# Patient Record
Sex: Male | Born: 1945
Health system: Southern US, Community
[De-identification: ages and names within clinical notes are randomized; demographics above are authoritative.]

## PROBLEM LIST (undated history)

## (undated) ENCOUNTER — Emergency Department: Admission: EM | Discharge status: Absent without leave | Payer: Self-pay

## (undated) DIAGNOSIS — F329 Major depressive disorder, single episode, unspecified: Secondary | ICD-10-CM

## (undated) DIAGNOSIS — G4730 Sleep apnea, unspecified: Secondary | ICD-10-CM

## (undated) DIAGNOSIS — I679 Cerebrovascular disease, unspecified: Secondary | ICD-10-CM

## (undated) DIAGNOSIS — I10 Essential (primary) hypertension: Secondary | ICD-10-CM

## (undated) DIAGNOSIS — G629 Polyneuropathy, unspecified: Secondary | ICD-10-CM

## (undated) DIAGNOSIS — J189 Pneumonia, unspecified organism: Secondary | ICD-10-CM

## (undated) DIAGNOSIS — M7750 Other enthesopathy of unspecified foot: Secondary | ICD-10-CM

## (undated) DIAGNOSIS — I4891 Unspecified atrial fibrillation: Secondary | ICD-10-CM

## (undated) DIAGNOSIS — E785 Hyperlipidemia, unspecified: Secondary | ICD-10-CM

## (undated) DIAGNOSIS — I259 Chronic ischemic heart disease, unspecified: Secondary | ICD-10-CM

## (undated) DIAGNOSIS — N400 Enlarged prostate without lower urinary tract symptoms: Secondary | ICD-10-CM

## (undated) DIAGNOSIS — I639 Cerebral infarction, unspecified: Secondary | ICD-10-CM

## (undated) DIAGNOSIS — N179 Acute kidney failure, unspecified: Secondary | ICD-10-CM

## (undated) DIAGNOSIS — Z87442 Personal history of urinary calculi: Secondary | ICD-10-CM

## (undated) DIAGNOSIS — G4733 Obstructive sleep apnea (adult) (pediatric): Secondary | ICD-10-CM

## (undated) DIAGNOSIS — M549 Dorsalgia, unspecified: Secondary | ICD-10-CM

## (undated) DIAGNOSIS — F341 Dysthymic disorder: Secondary | ICD-10-CM

## (undated) DIAGNOSIS — I651 Occlusion and stenosis of basilar artery: Secondary | ICD-10-CM

## (undated) HISTORY — DX: Polyneuropathy, unspecified: G62.9

## (undated) HISTORY — DX: Essential (primary) hypertension: I10

## (undated) HISTORY — DX: Sleep apnea, unspecified: G47.30

## (undated) HISTORY — DX: Personal history of urinary calculi: Z87.442

## (undated) HISTORY — DX: Dorsalgia, unspecified: M54.9

## (undated) HISTORY — DX: Hyperlipidemia, unspecified: E78.5

## (undated) HISTORY — DX: Obstructive sleep apnea (adult) (pediatric): G47.33

## (undated) HISTORY — DX: Cerebrovascular disease, unspecified: I67.9

## (undated) HISTORY — DX: Occlusion and stenosis of basilar artery: I65.1

## (undated) HISTORY — DX: Major depressive disorder, single episode, unspecified: F32.9

## (undated) HISTORY — DX: Pneumonia, unspecified organism: J18.9

## (undated) HISTORY — PX: BRAIN SURGERY: SHX531

## (undated) HISTORY — DX: Chronic ischemic heart disease, unspecified: I25.9

---
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
## 1987-06-22 HISTORY — PX: ANGIOPLASTY: SHX39

## 1990-06-21 HISTORY — PX: ANGIOPLASTY: SHX39

## 1996-06-21 HISTORY — PX: ANGIOPLASTY: SHX39

## 1998-06-21 HISTORY — PX: CORONARY STENT PLACEMENT: SHX1402

## 1999-05-12 ENCOUNTER — Inpatient Hospital Stay (HOSPITAL_COMMUNITY): Admission: EM | Admit: 1999-05-12 | Discharge: 1999-05-13 | Payer: Self-pay | Admitting: *Deleted

## 1999-05-12 ENCOUNTER — Encounter: Payer: Self-pay | Admitting: Cardiology

## 2001-04-16 ENCOUNTER — Encounter: Payer: Self-pay | Admitting: Emergency Medicine

## 2001-04-16 ENCOUNTER — Inpatient Hospital Stay (HOSPITAL_COMMUNITY): Admission: EM | Admit: 2001-04-16 | Discharge: 2001-04-25 | Payer: Self-pay | Admitting: Emergency Medicine

## 2001-04-16 ENCOUNTER — Encounter: Payer: Self-pay | Admitting: Neurology

## 2001-04-16 DIAGNOSIS — I639 Cerebral infarction, unspecified: Secondary | ICD-10-CM

## 2001-04-16 HISTORY — DX: Cerebral infarction, unspecified: I63.9

## 2001-04-17 ENCOUNTER — Encounter: Payer: Self-pay | Admitting: Neurology

## 2001-04-21 ENCOUNTER — Encounter: Payer: Self-pay | Admitting: Neurology

## 2001-07-08 ENCOUNTER — Emergency Department (HOSPITAL_COMMUNITY): Admission: EM | Admit: 2001-07-08 | Discharge: 2001-07-08 | Payer: Self-pay | Admitting: Emergency Medicine

## 2001-07-09 ENCOUNTER — Encounter: Payer: Self-pay | Admitting: Emergency Medicine

## 2001-11-04 ENCOUNTER — Ambulatory Visit (HOSPITAL_COMMUNITY): Admission: RE | Admit: 2001-11-04 | Discharge: 2001-11-04 | Payer: Self-pay | Admitting: Interventional Radiology

## 2001-11-10 ENCOUNTER — Inpatient Hospital Stay (HOSPITAL_COMMUNITY): Admission: EM | Admit: 2001-11-10 | Discharge: 2001-11-16 | Payer: Self-pay

## 2001-11-11 ENCOUNTER — Encounter: Payer: Self-pay | Admitting: Cardiology

## 2001-11-14 ENCOUNTER — Encounter: Payer: Self-pay | Admitting: Cardiology

## 2004-08-19 ENCOUNTER — Ambulatory Visit (HOSPITAL_COMMUNITY): Admission: RE | Admit: 2004-08-19 | Discharge: 2004-08-19 | Payer: Self-pay | Admitting: Neurology

## 2007-09-04 DIAGNOSIS — M7750 Other enthesopathy of unspecified foot: Secondary | ICD-10-CM | POA: Insufficient documentation

## 2007-09-04 HISTORY — DX: Other enthesopathy of unspecified foot and ankle: M77.50

## 2007-09-05 DIAGNOSIS — I2510 Atherosclerotic heart disease of native coronary artery without angina pectoris: Secondary | ICD-10-CM | POA: Insufficient documentation

## 2007-09-18 DIAGNOSIS — E118 Type 2 diabetes mellitus with unspecified complications: Secondary | ICD-10-CM | POA: Insufficient documentation

## 2007-09-18 DIAGNOSIS — E119 Type 2 diabetes mellitus without complications: Secondary | ICD-10-CM | POA: Insufficient documentation

## 2007-10-16 DIAGNOSIS — R300 Dysuria: Secondary | ICD-10-CM | POA: Insufficient documentation

## 2009-10-24 ENCOUNTER — Encounter: Admission: RE | Admit: 2009-10-24 | Discharge: 2009-10-24 | Payer: Self-pay | Admitting: Cardiology

## 2009-10-31 ENCOUNTER — Encounter: Payer: Self-pay | Admitting: Thoracic Surgery (Cardiothoracic Vascular Surgery)

## 2009-10-31 ENCOUNTER — Ambulatory Visit (HOSPITAL_COMMUNITY): Admission: RE | Admit: 2009-10-31 | Discharge: 2009-10-31 | Payer: Self-pay | Admitting: Cardiology

## 2009-10-31 ENCOUNTER — Ambulatory Visit: Payer: Self-pay | Admitting: Surgery

## 2009-11-06 ENCOUNTER — Ambulatory Visit: Payer: Self-pay | Admitting: Cardiothoracic Surgery

## 2009-11-10 ENCOUNTER — Encounter: Payer: Self-pay | Admitting: Cardiothoracic Surgery

## 2009-11-12 ENCOUNTER — Inpatient Hospital Stay (HOSPITAL_COMMUNITY): Admission: RE | Admit: 2009-11-12 | Discharge: 2009-11-18 | Payer: Self-pay | Admitting: Cardiothoracic Surgery

## 2009-11-12 HISTORY — PX: CORONARY ARTERY BYPASS GRAFT: SHX141

## 2009-11-13 ENCOUNTER — Ambulatory Visit: Payer: Self-pay | Admitting: Cardiothoracic Surgery

## 2009-12-04 ENCOUNTER — Encounter: Admission: RE | Admit: 2009-12-04 | Discharge: 2009-12-04 | Payer: Self-pay | Admitting: Cardiothoracic Surgery

## 2009-12-04 ENCOUNTER — Ambulatory Visit: Payer: Self-pay | Admitting: Cardiothoracic Surgery

## 2010-04-07 ENCOUNTER — Ambulatory Visit: Payer: Self-pay | Admitting: Cardiology

## 2010-06-17 ENCOUNTER — Encounter
Admission: RE | Admit: 2010-06-17 | Discharge: 2010-06-17 | Payer: Self-pay | Source: Home / Self Care | Attending: Cardiology | Admitting: Cardiology

## 2010-06-18 ENCOUNTER — Ambulatory Visit: Payer: Self-pay | Admitting: Cardiology

## 2010-09-07 LAB — GLUCOSE, CAPILLARY
Glucose-Capillary: 113 mg/dL — ABNORMAL HIGH (ref 70–99)
Glucose-Capillary: 121 mg/dL — ABNORMAL HIGH (ref 70–99)
Glucose-Capillary: 136 mg/dL — ABNORMAL HIGH (ref 70–99)
Glucose-Capillary: 138 mg/dL — ABNORMAL HIGH (ref 70–99)
Glucose-Capillary: 148 mg/dL — ABNORMAL HIGH (ref 70–99)
Glucose-Capillary: 153 mg/dL — ABNORMAL HIGH (ref 70–99)
Glucose-Capillary: 167 mg/dL — ABNORMAL HIGH (ref 70–99)
Glucose-Capillary: 177 mg/dL — ABNORMAL HIGH (ref 70–99)
Glucose-Capillary: 180 mg/dL — ABNORMAL HIGH (ref 70–99)
Glucose-Capillary: 181 mg/dL — ABNORMAL HIGH (ref 70–99)
Glucose-Capillary: 183 mg/dL — ABNORMAL HIGH (ref 70–99)
Glucose-Capillary: 185 mg/dL — ABNORMAL HIGH (ref 70–99)
Glucose-Capillary: 192 mg/dL — ABNORMAL HIGH (ref 70–99)
Glucose-Capillary: 192 mg/dL — ABNORMAL HIGH (ref 70–99)
Glucose-Capillary: 200 mg/dL — ABNORMAL HIGH (ref 70–99)
Glucose-Capillary: 200 mg/dL — ABNORMAL HIGH (ref 70–99)
Glucose-Capillary: 203 mg/dL — ABNORMAL HIGH (ref 70–99)
Glucose-Capillary: 203 mg/dL — ABNORMAL HIGH (ref 70–99)
Glucose-Capillary: 204 mg/dL — ABNORMAL HIGH (ref 70–99)
Glucose-Capillary: 207 mg/dL — ABNORMAL HIGH (ref 70–99)
Glucose-Capillary: 224 mg/dL — ABNORMAL HIGH (ref 70–99)
Glucose-Capillary: 232 mg/dL — ABNORMAL HIGH (ref 70–99)
Glucose-Capillary: 234 mg/dL — ABNORMAL HIGH (ref 70–99)
Glucose-Capillary: 241 mg/dL — ABNORMAL HIGH (ref 70–99)
Glucose-Capillary: 257 mg/dL — ABNORMAL HIGH (ref 70–99)
Glucose-Capillary: 267 mg/dL — ABNORMAL HIGH (ref 70–99)
Glucose-Capillary: 73 mg/dL (ref 70–99)
Glucose-Capillary: 82 mg/dL (ref 70–99)
Glucose-Capillary: 93 mg/dL (ref 70–99)
Glucose-Capillary: 207 mg/dL — ABNORMAL HIGH (ref 70–99)
Glucose-Capillary: 209 mg/dL — ABNORMAL HIGH (ref 70–99)
Glucose-Capillary: 214 mg/dL — ABNORMAL HIGH (ref 70–99)
Glucose-Capillary: 256 mg/dL — ABNORMAL HIGH (ref 70–99)
Glucose-Capillary: 261 mg/dL — ABNORMAL HIGH (ref 70–99)
Glucose-Capillary: 263 mg/dL — ABNORMAL HIGH (ref 70–99)

## 2010-09-07 LAB — POCT I-STAT 3, ART BLOOD GAS (G3+)
Acid-base deficit: 1 mmol/L (ref 0.0–2.0)
Acid-base deficit: 4 mmol/L — ABNORMAL HIGH (ref 0.0–2.0)
Acid-base deficit: 4 mmol/L — ABNORMAL HIGH (ref 0.0–2.0)
Bicarbonate: 21.6 mEq/L (ref 20.0–24.0)
Bicarbonate: 22.3 mEq/L (ref 20.0–24.0)
Bicarbonate: 24.7 mEq/L — ABNORMAL HIGH (ref 20.0–24.0)
O2 Saturation: 100 %
O2 Saturation: 94 %
O2 Saturation: 98 %
Patient temperature: 35.1
Patient temperature: 36.5
TCO2: 23 mmol/L (ref 0–100)
TCO2: 24 mmol/L (ref 0–100)
TCO2: 26 mmol/L (ref 0–100)
pCO2 arterial: 40.5 mmHg (ref 35.0–45.0)
pCO2 arterial: 41.8 mmHg (ref 35.0–45.0)
pCO2 arterial: 45.8 mmHg — ABNORMAL HIGH (ref 35.0–45.0)
pH, Arterial: 7.327 — ABNORMAL LOW (ref 7.350–7.450)
pH, Arterial: 7.333 — ABNORMAL LOW (ref 7.350–7.450)
pH, Arterial: 7.34 — ABNORMAL LOW (ref 7.350–7.450)
pO2, Arterial: 109 mmHg — ABNORMAL HIGH (ref 80.0–100.0)
pO2, Arterial: 252 mmHg — ABNORMAL HIGH (ref 80.0–100.0)
pO2, Arterial: 70 mmHg — ABNORMAL LOW (ref 80.0–100.0)

## 2010-09-07 LAB — POCT I-STAT 4, (NA,K, GLUC, HGB,HCT)
Glucose, Bld: 121 mg/dL — ABNORMAL HIGH (ref 70–99)
Glucose, Bld: 134 mg/dL — ABNORMAL HIGH (ref 70–99)
Glucose, Bld: 156 mg/dL — ABNORMAL HIGH (ref 70–99)
Glucose, Bld: 186 mg/dL — ABNORMAL HIGH (ref 70–99)
Glucose, Bld: 233 mg/dL — ABNORMAL HIGH (ref 70–99)
Glucose, Bld: 99 mg/dL (ref 70–99)
HCT: 25 % — ABNORMAL LOW (ref 39.0–52.0)
HCT: 26 % — ABNORMAL LOW (ref 39.0–52.0)
HCT: 27 % — ABNORMAL LOW (ref 39.0–52.0)
HCT: 32 % — ABNORMAL LOW (ref 39.0–52.0)
HCT: 33 % — ABNORMAL LOW (ref 39.0–52.0)
HCT: 37 % — ABNORMAL LOW (ref 39.0–52.0)
Hemoglobin: 10.9 g/dL — ABNORMAL LOW (ref 13.0–17.0)
Hemoglobin: 11.2 g/dL — ABNORMAL LOW (ref 13.0–17.0)
Hemoglobin: 12.6 g/dL — ABNORMAL LOW (ref 13.0–17.0)
Hemoglobin: 8.5 g/dL — ABNORMAL LOW (ref 13.0–17.0)
Hemoglobin: 8.8 g/dL — ABNORMAL LOW (ref 13.0–17.0)
Hemoglobin: 9.2 g/dL — ABNORMAL LOW (ref 13.0–17.0)
Potassium: 3.5 mEq/L (ref 3.5–5.1)
Potassium: 4.1 mEq/L (ref 3.5–5.1)
Potassium: 4.1 mEq/L (ref 3.5–5.1)
Potassium: 4.2 mEq/L (ref 3.5–5.1)
Potassium: 4.7 mEq/L (ref 3.5–5.1)
Potassium: 5.3 mEq/L — ABNORMAL HIGH (ref 3.5–5.1)
Sodium: 131 mEq/L — ABNORMAL LOW (ref 135–145)
Sodium: 134 mEq/L — ABNORMAL LOW (ref 135–145)
Sodium: 136 mEq/L (ref 135–145)
Sodium: 136 mEq/L (ref 135–145)
Sodium: 138 mEq/L (ref 135–145)
Sodium: 138 mEq/L (ref 135–145)

## 2010-09-07 LAB — BASIC METABOLIC PANEL
BUN: 7 mg/dL (ref 6–23)
BUN: 8 mg/dL (ref 6–23)
BUN: 9 mg/dL (ref 6–23)
CO2: 24 mEq/L (ref 19–32)
CO2: 28 mEq/L (ref 19–32)
CO2: 28 mEq/L (ref 19–32)
Calcium: 7.6 mg/dL — ABNORMAL LOW (ref 8.4–10.5)
Calcium: 8 mg/dL — ABNORMAL LOW (ref 8.4–10.5)
Calcium: 8.5 mg/dL (ref 8.4–10.5)
Chloride: 101 mEq/L (ref 96–112)
Chloride: 103 mEq/L (ref 96–112)
Chloride: 109 mEq/L (ref 96–112)
Creatinine, Ser: 1.02 mg/dL (ref 0.4–1.5)
Creatinine, Ser: 1.02 mg/dL (ref 0.4–1.5)
Creatinine, Ser: 1.04 mg/dL (ref 0.4–1.5)
GFR calc Af Amer: >60 mL/min (ref >60)
GFR calc Af Amer: >60 mL/min (ref >60)
GFR calc Af Amer: >60 mL/min (ref >60)
GFR calc non Af Amer: >60 mL/min (ref >60)
GFR calc non Af Amer: >60 mL/min (ref >60)
GFR calc non Af Amer: >60 mL/min (ref >60)
Glucose, Bld: 142 mg/dL — ABNORMAL HIGH (ref 70–99)
Glucose, Bld: 170 mg/dL — ABNORMAL HIGH (ref 70–99)
Glucose, Bld: 193 mg/dL — ABNORMAL HIGH (ref 70–99)
Potassium: 3.6 mEq/L (ref 3.5–5.1)
Potassium: 3.8 mEq/L (ref 3.5–5.1)
Potassium: 3.9 mEq/L (ref 3.5–5.1)
Sodium: 134 mEq/L — ABNORMAL LOW (ref 135–145)
Sodium: 136 mEq/L (ref 135–145)
Sodium: 138 mEq/L (ref 135–145)

## 2010-09-07 LAB — POCT I-STAT, CHEM 8
BUN: 10 mg/dL (ref 6–23)
BUN: 9 mg/dL (ref 6–23)
Calcium, Ion: 1.1 mmol/L — ABNORMAL LOW (ref 1.12–1.32)
Calcium, Ion: 1.13 mmol/L (ref 1.12–1.32)
Chloride: 103 mEq/L (ref 96–112)
Chloride: 106 mEq/L (ref 96–112)
Creatinine, Ser: 1 mg/dL (ref 0.4–1.5)
Creatinine, Ser: 1.1 mg/dL (ref 0.4–1.5)
Glucose, Bld: 200 mg/dL — ABNORMAL HIGH (ref 70–99)
Glucose, Bld: 205 mg/dL — ABNORMAL HIGH (ref 70–99)
HCT: 35 % — ABNORMAL LOW (ref 39.0–52.0)
HCT: 39 % (ref 39.0–52.0)
Hemoglobin: 11.9 g/dL — ABNORMAL LOW (ref 13.0–17.0)
Hemoglobin: 13.3 g/dL (ref 13.0–17.0)
Potassium: 3.7 mEq/L (ref 3.5–5.1)
Potassium: 4.1 mEq/L (ref 3.5–5.1)
Sodium: 136 mEq/L (ref 135–145)
Sodium: 140 mEq/L (ref 135–145)
TCO2: 25 mmol/L (ref 0–100)
TCO2: 25 mmol/L (ref 0–100)

## 2010-09-07 LAB — CBC
HCT: 35.3 % — ABNORMAL LOW (ref 39.0–52.0)
HCT: 36 % — ABNORMAL LOW (ref 39.0–52.0)
HCT: 36.2 % — ABNORMAL LOW (ref 39.0–52.0)
HCT: 36.2 % — ABNORMAL LOW (ref 39.0–52.0)
HCT: 36.9 % — ABNORMAL LOW (ref 39.0–52.0)
HCT: 37.5 % — ABNORMAL LOW (ref 39.0–52.0)
HCT: 42 % (ref 39.0–52.0)
Hemoglobin: 12.3 g/dL — ABNORMAL LOW (ref 13.0–17.0)
Hemoglobin: 12.3 g/dL — ABNORMAL LOW (ref 13.0–17.0)
Hemoglobin: 12.6 g/dL — ABNORMAL LOW (ref 13.0–17.0)
Hemoglobin: 12.6 g/dL — ABNORMAL LOW (ref 13.0–17.0)
Hemoglobin: 12.7 g/dL — ABNORMAL LOW (ref 13.0–17.0)
Hemoglobin: 13 g/dL (ref 13.0–17.0)
Hemoglobin: 14.7 g/dL (ref 13.0–17.0)
MCHC: 34.1 g/dL (ref 30.0–36.0)
MCHC: 34.3 g/dL (ref 30.0–36.0)
MCHC: 34.6 g/dL (ref 30.0–36.0)
MCHC: 34.8 g/dL (ref 30.0–36.0)
MCHC: 35 g/dL (ref 30.0–36.0)
MCHC: 35.1 g/dL (ref 30.0–36.0)
MCHC: 35 g/dL (ref 30.0–36.0)
MCV: 91.4 fL (ref 78.0–100.0)
MCV: 91.4 fL (ref 78.0–100.0)
MCV: 91.7 fL (ref 78.0–100.0)
MCV: 91.7 fL (ref 78.0–100.0)
MCV: 91.9 fL (ref 78.0–100.0)
MCV: 92.6 fL (ref 78.0–100.0)
MCV: 90.5 fL (ref 78.0–100.0)
Platelets: 175 10*3/uL (ref 150–400)
Platelets: 188 10*3/uL (ref 150–400)
Platelets: 190 10*3/uL (ref 150–400)
Platelets: 193 10*3/uL (ref 150–400)
Platelets: 197 10*3/uL (ref 150–400)
Platelets: 212 10*3/uL (ref 150–400)
Platelets: 263 10*3/uL (ref 150–400)
RBC: 3.86 MIL/uL — ABNORMAL LOW (ref 4.22–5.81)
RBC: 3.94 MIL/uL — ABNORMAL LOW (ref 4.22–5.81)
RBC: 3.94 MIL/uL — ABNORMAL LOW (ref 4.22–5.81)
RBC: 3.95 MIL/uL — ABNORMAL LOW (ref 4.22–5.81)
RBC: 4.01 MIL/uL — ABNORMAL LOW (ref 4.22–5.81)
RBC: 4.05 MIL/uL — ABNORMAL LOW (ref 4.22–5.81)
RBC: 4.64 MIL/uL (ref 4.22–5.81)
RDW: 13.6 % (ref 11.5–15.5)
RDW: 13.8 % (ref 11.5–15.5)
RDW: 13.8 % (ref 11.5–15.5)
RDW: 13.8 % (ref 11.5–15.5)
RDW: 14 % (ref 11.5–15.5)
RDW: 14 % (ref 11.5–15.5)
RDW: 13.8 % (ref 11.5–15.5)
WBC: 10.2 10*3/uL (ref 4.0–10.5)
WBC: 11.3 10*3/uL — ABNORMAL HIGH (ref 4.0–10.5)
WBC: 12.5 10*3/uL — ABNORMAL HIGH (ref 4.0–10.5)
WBC: 13.7 10*3/uL — ABNORMAL HIGH (ref 4.0–10.5)
WBC: 14.2 10*3/uL — ABNORMAL HIGH (ref 4.0–10.5)
WBC: 8.6 10*3/uL (ref 4.0–10.5)
WBC: 7.5 10*3/uL (ref 4.0–10.5)

## 2010-09-07 LAB — BLOOD GAS, ARTERIAL
Acid-base deficit: 0.3 mmol/L (ref 0.0–2.0)
Bicarbonate: 23.6 mEq/L (ref 20.0–24.0)
Drawn by: 206361
FIO2: 0.21 %
O2 Saturation: 94.3 %
Patient temperature: 98.6
TCO2: 24.8 mmol/L (ref 0–100)
pCO2 arterial: 37.5 mmHg (ref 35.0–45.0)
pH, Arterial: 7.416 (ref 7.350–7.450)
pO2, Arterial: 71.5 mmHg — ABNORMAL LOW (ref 80.0–100.0)

## 2010-09-07 LAB — COMPREHENSIVE METABOLIC PANEL
ALT: 25 U/L (ref 0–53)
AST: 36 U/L (ref 0–37)
Albumin: 4.1 g/dL (ref 3.5–5.2)
Alkaline Phosphatase: 70 U/L (ref 39–117)
BUN: 21 mg/dL (ref 6–23)
CO2: 22 mEq/L (ref 19–32)
Calcium: 9.6 mg/dL (ref 8.4–10.5)
Chloride: 103 mEq/L (ref 96–112)
Creatinine, Ser: 1.16 mg/dL (ref 0.4–1.5)
GFR calc Af Amer: >60 mL/min (ref >60)
GFR calc non Af Amer: >60 mL/min (ref >60)
Glucose, Bld: 227 mg/dL — ABNORMAL HIGH (ref 70–99)
Potassium: 4.4 mEq/L (ref 3.5–5.1)
Sodium: 134 mEq/L — ABNORMAL LOW (ref 135–145)
Total Bilirubin: 0.5 mg/dL (ref 0.3–1.2)
Total Protein: 6.3 g/dL (ref 6.0–8.3)

## 2010-09-07 LAB — SURGICAL PCR SCREEN
MRSA, PCR: NEGATIVE
Staphylococcus aureus: NEGATIVE

## 2010-09-07 LAB — URINALYSIS, ROUTINE W REFLEX MICROSCOPIC
Bilirubin Urine: NEGATIVE
Glucose, UA: >1000 mg/dL — AB
Hgb urine dipstick: NEGATIVE
Ketones, ur: NEGATIVE mg/dL
Leukocytes, UA: NEGATIVE
Nitrite: NEGATIVE
Protein, ur: NEGATIVE mg/dL
Specific Gravity, Urine: 1.023 (ref 1.005–1.030)
Urobilinogen, UA: 0.2 mg/dL (ref 0.0–1.0)
pH: 5.5 (ref 5.0–8.0)

## 2010-09-07 LAB — URINE MICROSCOPIC-ADD ON

## 2010-09-07 LAB — PROTIME-INR
INR: 1.4 (ref 0.00–1.49)
INR: 0.97 (ref 0.00–1.49)
Prothrombin Time: 17 seconds — ABNORMAL HIGH (ref 11.6–15.2)
Prothrombin Time: 12.8 seconds (ref 11.6–15.2)

## 2010-09-07 LAB — HEMOGLOBIN A1C
Hgb A1c MFr Bld: 10.4 % — ABNORMAL HIGH (ref <5.7)
Mean Plasma Glucose: 252 mg/dL — ABNORMAL HIGH (ref <117)

## 2010-09-07 LAB — POCT I-STAT GLUCOSE
Glucose, Bld: 147 mg/dL — ABNORMAL HIGH (ref 70–99)
Glucose, Bld: 188 mg/dL — ABNORMAL HIGH (ref 70–99)
Operator id: 151361
Operator id: 3406

## 2010-09-07 LAB — TYPE AND SCREEN
ABO/RH(D): O POS
Antibody Screen: NEGATIVE

## 2010-09-07 LAB — PLATELET COUNT: Platelets: 171 10*3/uL (ref 150–400)

## 2010-09-07 LAB — CREATININE, SERUM
Creatinine, Ser: 1.16 mg/dL (ref 0.4–1.5)
Creatinine, Ser: 1.18 mg/dL (ref 0.4–1.5)
GFR calc Af Amer: >60 mL/min (ref >60)
GFR calc Af Amer: >60 mL/min (ref >60)
GFR calc non Af Amer: >60 mL/min (ref >60)
GFR calc non Af Amer: >60 mL/min (ref >60)

## 2010-09-07 LAB — MAGNESIUM
Magnesium: 2.1 mg/dL (ref 1.5–2.5)
Magnesium: 2.2 mg/dL (ref 1.5–2.5)
Magnesium: 2.6 mg/dL — ABNORMAL HIGH (ref 1.5–2.5)

## 2010-09-07 LAB — ABO/RH: ABO/RH(D): O POS

## 2010-09-07 LAB — APTT
aPTT: 36 seconds (ref 24–37)
aPTT: 29 seconds (ref 24–37)

## 2010-09-07 LAB — HEMOGLOBIN AND HEMATOCRIT, BLOOD
HCT: 26.2 % — ABNORMAL LOW (ref 39.0–52.0)
Hemoglobin: 9.2 g/dL — ABNORMAL LOW (ref 13.0–17.0)

## 2010-09-07 LAB — MRSA PCR SCREENING: MRSA by PCR: NEGATIVE

## 2010-09-08 LAB — PROTIME-INR
INR: 1 (ref 0.00–1.49)
Prothrombin Time: 13.1 seconds (ref 11.6–15.2)

## 2010-09-08 LAB — GLUCOSE, CAPILLARY
Glucose-Capillary: 218 mg/dL — ABNORMAL HIGH (ref 70–99)
Glucose-Capillary: 270 mg/dL — ABNORMAL HIGH (ref 70–99)

## 2010-10-05 ENCOUNTER — Other Ambulatory Visit: Payer: Self-pay | Admitting: *Deleted

## 2010-10-05 ENCOUNTER — Encounter: Payer: Self-pay | Admitting: *Deleted

## 2010-10-05 DIAGNOSIS — E7800 Pure hypercholesterolemia, unspecified: Secondary | ICD-10-CM

## 2010-10-06 ENCOUNTER — Encounter: Payer: Self-pay | Admitting: Cardiology

## 2010-10-06 ENCOUNTER — Other Ambulatory Visit (INDEPENDENT_AMBULATORY_CARE_PROVIDER_SITE_OTHER): Payer: Medicare Other | Admitting: *Deleted

## 2010-10-06 ENCOUNTER — Ambulatory Visit (INDEPENDENT_AMBULATORY_CARE_PROVIDER_SITE_OTHER): Payer: Medicare Other | Admitting: Cardiology

## 2010-10-06 DIAGNOSIS — I739 Peripheral vascular disease, unspecified: Secondary | ICD-10-CM | POA: Insufficient documentation

## 2010-10-06 DIAGNOSIS — I10 Essential (primary) hypertension: Secondary | ICD-10-CM

## 2010-10-06 DIAGNOSIS — E119 Type 2 diabetes mellitus without complications: Secondary | ICD-10-CM | POA: Insufficient documentation

## 2010-10-06 DIAGNOSIS — I679 Cerebrovascular disease, unspecified: Secondary | ICD-10-CM

## 2010-10-06 DIAGNOSIS — M545 Low back pain, unspecified: Secondary | ICD-10-CM

## 2010-10-06 DIAGNOSIS — I2510 Atherosclerotic heart disease of native coronary artery without angina pectoris: Secondary | ICD-10-CM

## 2010-10-06 DIAGNOSIS — M4800 Spinal stenosis, site unspecified: Secondary | ICD-10-CM

## 2010-10-06 DIAGNOSIS — E7800 Pure hypercholesterolemia, unspecified: Secondary | ICD-10-CM

## 2010-10-06 DIAGNOSIS — Z951 Presence of aortocoronary bypass graft: Secondary | ICD-10-CM | POA: Insufficient documentation

## 2010-10-06 DIAGNOSIS — I779 Disorder of arteries and arterioles, unspecified: Secondary | ICD-10-CM | POA: Insufficient documentation

## 2010-10-06 DIAGNOSIS — I25810 Atherosclerosis of coronary artery bypass graft(s) without angina pectoris: Secondary | ICD-10-CM | POA: Insufficient documentation

## 2010-10-06 HISTORY — DX: Disorder of arteries and arterioles, unspecified: I77.9

## 2010-10-06 MED ORDER — NITROGLYCERIN 0.4 MG SL SUBL: 0.4000 mg | SUBLINGUAL_TABLET | SUBLINGUAL | Status: DC | PRN

## 2010-10-06 MED ORDER — METOPROLOL TARTRATE 50 MG PO TABS: 50.0000 mg | ORAL_TABLET | Freq: Two times a day (BID) | ORAL | Status: DC

## 2010-10-06 NOTE — Assessment & Plan Note (Addendum)
I don't know if he is having claudication or neurogenic claudication. He has adequate pedal pulses. I like to get lower extremity arterial Dopplers and an MRI of his low back. He does have recurrent low back pain and I thought he has had spinal stenosis for many years but he's not been able to afford the test. He is now on Medicare and should be able to afford an MRI for evaluation.

## 2010-10-06 NOTE — Assessment & Plan Note (Signed)
He still has some soreness in his chest from his bypass surgery but is not having any recurrent angina

## 2010-10-06 NOTE — Progress Notes (Signed)
Subjective:   Marvin Salinas is seen today for a followup visit. He is seen in followup of his coronary artery disease and his coronary artery bypass grafting x3 on may 25th, 2011 by Dr. Tyrone Sage. 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 In general, he's been stable from that issue. He had ischemic heart disease with previous angioplasty of the right coronary artery in 1989. He is angioplasty left anterior descending and obtuse marginal in 1992. He angioplasty to first diagonal in October 1998 had a stent in his LAD circumflex and 8/to the diagonal in November 2000.  Other problems include diabetes, hypertension, hyperlipemia, obesity, sleep apnea, and renal calculi. He does have significant peripheral neuropathy and poorly controlled diabetes with recent hemoglobin A1c of 9.  His current complaint is that of aching in his right hip in particular with walking or with effort.  Current Outpatient Prescriptions  Medication Sig Dispense Refill  . aspirin 81 MG tablet Take 81 mg by mouth daily.        . clopidogrel (PLAVIX) 75 MG tablet Take 75 mg by mouth daily.        . fenofibrate (TRICOR) 145 MG tablet Take 145 mg by mouth daily.        . Hypromellose 0.3 % SOLN 1 drop. Into each eye two times daily       . insulin glargine (LANTUS) 100 UNIT/ML injection Inject into the skin as directed.        . metFORMIN (GLUCOPHAGE) 1000 MG tablet Take 1,000 mg by mouth 2 (two) times daily with a meal.        . metoprolol (LOPRESSOR) 50 MG tablet Take 1 tablet (50 mg total) by mouth 2 (two) times daily.  90 tablet  3  . multivitamin (THERAGRAN) per tablet Take 1 tablet by mouth daily.        . nitroGLYCERIN (NITROSTAT) 0.4 MG SL tablet Place 1 tablet (0.4 mg total) under the tongue every 5 (five) minutes as needed.  25 tablet  11  . olmesartan-hydrochlorothiazide (BENICAR HCT) 20-12.5 MG per tablet Take 1  tablet by mouth daily.        . pravastatin (PRAVACHOL) 40 MG tablet Take 40 mg by mouth daily.        Marland Kitchen DISCONTD: metoprolol (LOPRESSOR) 50 MG tablet Take 50 mg by mouth 2 (two) times daily.        Marland Kitchen DISCONTD: nitroGLYCERIN (NITROSTAT) 0.4 MG SL tablet Place 0.4 mg under the tongue every 5 (five) minutes as needed.        Marland Kitchen DISCONTD: FLUoxetine (PROZAC) 20 MG capsule Take 20 mg by mouth daily.        Marland Kitchen DISCONTD: olmesartan (BENICAR) 20 MG tablet Take 20 mg by mouth daily.          Allergies  Allergen Reactions  . Codeine Nausea And Vomiting  . Latex Hives  . Lisinopril Cough    Patient Active Problem List  Diagnoses  . CAD (coronary artery disease)  . Diabetes mellitus  . Cerebral vascular disease  . S/P CABG x 3  . HTN (hypertension)  . Claudication  . Spinal stenosis    History  Smoking status  . Former Smoker  . Types: Cigarettes  . Quit date: 10/19/2009  Smokeless tobacco  . Never Used    History  Alcohol Use No    Family History  Problem Relation Age  of Onset  . Hypertension Father   . Heart disease Father   . Asthma Brother   . Stroke      Uncle  . Heart attack Father     Review of Systems:   The patient denies any heat or cold intolerance.  No weight gain or weight loss.  The patient denies headaches or blurry vision.  There is no cough or sputum production.  The patient denies dizziness.  There is no hematuria or hematochezia.  The patient denies any muscle aches or arthritis.  The patient denies any rash.  The patient denies frequent falling or instability.  There is no history of depression or anxiety.  All other systems were reviewed and are negative.   Physical Exam:   Weight is 198. Blood pressure 126/68. Heart rate is 66 and regular. He has adequate posterior tibial pulses bilaterally. Dorsalis pedis pulses are somewhat fainter. I can appreciate no femoral artery bruits.The head is normocephalic and atraumatic.  Pupils are equally round and  reactive to light.  Sclerae nonicteric.  Conjunctiva is clear.  Oropharynx is unremarkable.  There's adequate oral airway.  Neck is supple there are no masses.  Thyroid is not enlarged.  There is no lymphadenopathy.  Lungs are clear.  Chest is symmetric.  Heart shows a regular rate and rhythm.  S1 and S2 are normal.  There is no murmur click or gallop.  Abdomen is soft normal bowel sounds.  There is no organomegaly.  Genital and rectal deferred.  Extremities are without edema.  .  Neurologically intact.  Full range of motion.  The patient is not depressed.  Skin is warm and dry.  Assessment / Plan:

## 2010-10-06 NOTE — Assessment & Plan Note (Signed)
I encouraged him to try to continue to modify his glucose control and improve his blood sugars.

## 2010-10-09 ENCOUNTER — Ambulatory Visit
Admission: RE | Admit: 2010-10-09 | Discharge: 2010-10-09 | Disposition: A | Discharge status: Home / Self Care | Payer: Medicare Other | Source: Ambulatory Visit | Attending: Cardiology | Admitting: Cardiology

## 2010-10-09 ENCOUNTER — Encounter (INDEPENDENT_AMBULATORY_CARE_PROVIDER_SITE_OTHER): Payer: Medicare Other | Admitting: Cardiology

## 2010-10-09 DIAGNOSIS — M545 Low back pain, unspecified: Secondary | ICD-10-CM

## 2010-10-09 DIAGNOSIS — I739 Peripheral vascular disease, unspecified: Secondary | ICD-10-CM

## 2010-10-12 ENCOUNTER — Encounter: Payer: Self-pay | Admitting: Cardiology

## 2010-10-15 ENCOUNTER — Telehealth: Payer: Self-pay | Admitting: *Deleted

## 2010-10-15 NOTE — Telephone Encounter (Signed)
Pt notified of normal arterial doppler results.

## 2010-10-15 NOTE — Telephone Encounter (Signed)
Message copied by Barnetta Hammersmith on Thu Oct 15, 2010  3:17 PM ------      Message from: Roger Shelter      Created: Tue Oct 13, 2010  9:20 AM       normal

## 2010-10-15 NOTE — Telephone Encounter (Signed)
Pt notified of MRI results and was encourage to perform back exercises.  Pt notified of bulge in aorta and will have abdominal u/s in one year.

## 2010-10-15 NOTE — Telephone Encounter (Signed)
Message copied by Barnetta Hammersmith on Thu Oct 15, 2010  3:22 PM ------      Message from: Norma Fredrickson      Created: Mon Oct 12, 2010  9:36 AM       Could do abdominal ultrasound in one year.       lori      ----- Message -----         From: Esperanza Sheets, RN         Sent: 10/12/2010   9:14 AM           To: Rosalio Macadamia, NP            Bulge of Aorta?  F/u?      ----- Message -----         From: Roger Shelter, MD         Sent: 10/09/2010   4:04 PM           To: Maryruth Hancock Burns-Spencer, RN            Report results and encourage back exercises.

## 2010-10-16 NOTE — Progress Notes (Signed)
Pt notified of MRI results and back exercises was encouraged.  RN told pt about bulge in aorta and to have abdominal u/s in one year.  RN set up a recall in computer for one year for abdominal u/s.

## 2010-10-28 ENCOUNTER — Ambulatory Visit (INDEPENDENT_AMBULATORY_CARE_PROVIDER_SITE_OTHER): Payer: Medicare Other | Admitting: Cardiology

## 2010-10-28 ENCOUNTER — Encounter: Payer: Self-pay | Admitting: Cardiology

## 2010-10-28 DIAGNOSIS — M4800 Spinal stenosis, site unspecified: Secondary | ICD-10-CM

## 2010-10-28 DIAGNOSIS — Z951 Presence of aortocoronary bypass graft: Secondary | ICD-10-CM

## 2010-10-28 NOTE — Progress Notes (Addendum)
Subjective:   Marvin Salinas seen today for followup visit. He still has some discomfort in his right hip that he has good pulses in his MRI showed osteoarthritis of the low back. She's complaining of some discomfort of the xiphoid process. [name] not had any cardiac symptoms or recurrent cerebral vascular issues. He is working as a Education administrator.  Current Outpatient Prescriptions  Medication Sig Dispense Refill  . aspirin 81 MG tablet Take 81 mg by mouth daily.        . clopidogrel (PLAVIX) 75 MG tablet Take 75 mg by mouth daily.        . fenofibrate (TRICOR) 145 MG tablet Take 145 mg by mouth daily.        . Hypromellose 0.3 % SOLN 1 drop. Into each eye two times daily       . insulin glargine (LANTUS) 100 UNIT/ML injection Inject into the skin as directed.        . metFORMIN (GLUCOPHAGE) 1000 MG tablet Take 1,000 mg by mouth 2 (two) times daily with a meal.        . metoprolol (LOPRESSOR) 50 MG tablet Take 50 mg by mouth 2 (two) times daily. 1 & 1/2 TABLETS 2 (TWO) TIMES DAILY       . nitroGLYCERIN (NITROSTAT) 0.4 MG SL tablet Place 1 tablet (0.4 mg total) under the tongue every 5 (five) minutes as needed.  25 tablet  11  . olmesartan-hydrochlorothiazide (BENICAR HCT) 20-12.5 MG per tablet Take 1 tablet by mouth daily.        . pravastatin (PRAVACHOL) 40 MG tablet Take 40 mg by mouth daily.        . multivitamin (THERAGRAN) per tablet Take 1 tablet by mouth daily.        Marland Kitchen DISCONTD: metoprolol (LOPRESSOR) 50 MG tablet Take 1 tablet (50 mg total) by mouth 2 (two) times daily.  90 tablet  3    Allergies  Allergen Reactions  . Codeine Nausea And Vomiting  . Latex Hives  . Lisinopril Cough    Patient Active Problem List  Diagnoses  . CAD (coronary artery disease)  . Diabetes mellitus  . Cerebral vascular disease  . S/P CABG x 3  . HTN (hypertension)  . Claudication  . Spinal stenosis    History  Smoking status  . Former Smoker  . Types: Cigarettes  . Quit date: 10/19/2009  Smokeless tobacco    . Never Used    History  Alcohol Use No    Family History  Problem Relation Age of Onset  . Hypertension Father   . Heart disease Father   . Asthma Brother   . Stroke      Uncle  . Heart attack Father     Review of Systems:   The patient denies any heat or cold intolerance.  No weight gain or weight loss.  The patient denies headaches or blurry vision.  There is no cough or sputum production.  The patient denies dizziness.  There is no hematuria or hematochezia.  The patient denies any muscle aches or arthritis.  The patient denies any rash.  The patient denies frequent falling or instability.  There is no history of depression or anxiety.  All other systems were reviewed and are negative.   Physical Exam:   Vital signs are reviewed.The head is normocephalic and atraumatic.  Pupils are equally round and reactive to light.  Sclerae nonicteric.  Conjunctiva is clear.  Oropharynx is unremarkable.  There's adequate oral airway.  Neck is supple there are no masses.  Thyroid is not enlarged.  There is no lymphadenopathy.  Lungs are clear.  Chest is symmetric.  Heart shows a regular rate and rhythm.  S1 and S2 are normal.  There is no murmur click or gallop.  Abdomen is soft normal bowel sounds.  There is no organomegaly.  Genital and rectal deferred.  Extremities are without edema.  Peripheral pulses are adequate.  Neurologically intact.  Full range of motion.  The patient is not depressed.  Skin is warm and dry. Assessment / Plan:

## 2010-10-28 NOTE — Assessment & Plan Note (Signed)
His MRI did not show spinal stenosis but just showed significant arthritis and prominence of bulging disc in the low back. I'll encourage him to do back exercises by Dr. Paris Lore

## 2010-10-28 NOTE — Assessment & Plan Note (Signed)
Marvin Salinas has not had any recurrent chest pain. He has a vague discomfort in his xiphoid process related to surgery. I've encouraged him to continue to be active and lose weight he

## 2010-11-03 NOTE — Assessment & Plan Note (Signed)
OFFICE VISIT   Marvin Salinas, CLOS  DOB:  1945-12-09                                        December 04, 2009  CHART #:  [id number]   HISTORY:  The patient returns to the office today in followup after his  recent coronary artery bypass grafting x3 done on Nov 12, 2009.  The  patient preoperatively had been a smoker.  He is currently not smoking  and seems dedicated to staying off tobacco products.  He has had no  recurrent angina.  He is increasing his activity appropriately and  currently living at home by himself without problem.  He has still had  some incisional discomfort especially at night and has requested some  additional pain medicine for this.   PHYSICAL EXAMINATION:  Blood pressure 145/84, pulse 70, respiratory rate  is 18, O2 sats 98%.  His sternum is stable and well healed.  Lungs are  clear bilaterally.  He has no pedal edema.  The endovein harvest sites  well healed.   Followup chest x-ray shows resolution of pleural effusions and clear  lung fields bilaterally.   We have given him a prescription for Vicodin 5/500 thirty tablets 2  every 6 hours p.r.n. as it needs for pain.  He will discontinue his  Lasix and decrease his Cordarone to 200 mg once a day.  Prior to  surgery, he had been on aspirin and Plavix and Coumadin.  The Coumadin  been long-term for vertebrobasilar insufficiency.  He is currently not  on Coumadin.  Continues on aspirin and Plavix.   The only new finding is that he has noted some slight intention tremor  in the right hand.  This is not constant but has comes and go.  He has  no other neurologic findings.   Overall, I am pleased with his progress.  I have not made him a return  appointment to see me but would be glad to see him in his or Dr.  Ronnald Nian request at anytime.   Sheliah Plane, MD  Electronically Signed   EG/MEDQ  D:  12/04/2009  T:  12/05/2009  Job:  [id number]   cc:   Colleen Can. Deborah Chalk, M.D.  [facility] [place]

## 2010-11-06 NOTE — Discharge Summary (Signed)
[facility]  Patient:    Marvin Salinas, Marvin Salinas Visit Number: [id number] MRN: [id number]          Service Type: MED Location: [id number] Attending Physician:  Lesly Dukes Dictated by:   Annie Main, N.P. Admit Date:  04/16/2001 Discharge Date: 04/25/2001   CC:         Marvin Salinas. Deborah Chalk, M.D.                           Discharge Summary  DATE OF BIRTH:  04/17/1946  ADMISSION DIAGNOSIS:  Posterior circulation transient ischemic attack.  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  ADDITIONAL DIAGNOSES:  1. Posterior circulation transient ischemic attacks.  2. Basilar artery stenosis.  3. Right vertebral artery stenosis.  4. Diabetes mellitus.  5. Hypertension.  6. Coronary artery disease.  7. Dyslipidemia.  8. Obesity.  9. History of sleep apnea. 10. History of renal calculi.  DISCHARGE MEDICATIONS:  1. Coumadin 10 mg today, then as directed, most likely alternating 10 mg with     7.5 mg q.d.  2. Enteric-coated aspirin 81 mg one p.o. q.d.  3. Lopressor 50 mg one p.o. b.i.d.  4. ______  160 mg one q.d. with dinner.  5. Pravachol 20 mg one q.d. with dinner.  6. Pepcid 20 mg one p.o. b.i.d.  ACTIVITY:  As tolerated.  DIET:  Low fat, low salt, no concentrated sweets.  WOUND CARE:  Not applicable.  STUDIES PERFORMED:  1. CAT scan performed April 16, 2001 showing a lacunar infarct in the right     basal ganglia.  2. MRI performed April 16, 2001 showing a 3-mm acute right cerebellar     infarct which was positive on the DWI with multiple chronic white matter     infarcts, lacunar infarct, left middle cerebral peduncle.  3. MRA on April 16, 2001 showing moderate-to-severe stenosis, proximal left     circumflex, moderate stenosis, origin of left common carotid artery,     bilateral external carotid artery.  Bilateral ICAs patent.  4.  Echocardiogram performed April 17, 2001 showing 55-65% ejection     fraction, LV wall thickness upper limits of normal, mild mitral     regurgitation.  No source of emboli.  5. Carotid Doppler performed April 18, 2001 showing no bilateral ICA     stenosis, vertebral flow antegrade.  6. Cerebral angiogram performed April 22, 2001 showing irregular stenosis     of mid-basilar, superior cerebellar, anteroinferior cerebellar artery,     with PICA not seen.  7. Attempted angioplasty, April 22, 2001, showing tight mid-basilar     stenosis with 50% occlusion, with ______ tortuosity in the left     vertebral.  Right vertebral very small, could not pass catheter, therefore     procedure aborted.  8. EKG performed April 16, 2001 showing sinus bradycardia, nonspecific     T wave abnormality.  9. Chest x-ray performed April 16, 2001 showing mild bronchitic changes.  LABORATORY DATA:  April 16, 2001, CBC on admission was within normal limits. There was no significant change by April 22, 2001.  Differential remained normal throughout hospital course.  On day of discharge, April 25, 2001, INR was 2.4 with a PT of 21.9.  Routine chemistry done on admission was normal except for elevated glucose  of 172.  By April 22, 2001, glucose was 137 with rest of CMET being normal.  Lipid studies showed an elevated cholesterol of 280, triglycerides elevated at 389, HDL 55, LDL 147.  Homocystine was 12.62.  HOSPITAL COURSE:  Marvin Salinas is a 65 year old right-handed white male who was admitted April 16, 2001 to [facility] after having a two-week history of transient episodes of blurred vision, perioral numbness, left-greater-than-right facial weakness and gait instability.  The patient also gets bifrontal headaches with these episodes.  The patient presented to the hospital at this point for further evaluation.  A CT of the brain shows evidence of a small basal ganglia stroke that  is old in the right brain.  No acute events are seen.  Neurology was asked by the EDP to see this patient for further evaluation.  Stroke workup was performed as above, finding an acute stroke in his right cerebellum; also of significance was basilar stenosis and left vertebral stenosis which were contributing to his TIA-like events.  On April 18, 2001, Dr. Julieanne Cotton was consulted to review stenosis for possible intervention.  Patient was hesitant to proceed with intervention due to lack of insurance coverage.  Patient progressed to have another event on April 18, 2001 of blurred vision and periorbital numbness.  On April 19, 2001, Dr. Kerby Nora again saw the patient and he agreed to interventional procedure.  Intervention was scheduled with anesthesia for November 1st at 10 a.m.  On Friday, April 21, 2001, angioplasty was attempted and failed, as above.  Three attempts were made to pass a catheter through the area but it could not be cannulated.  The procedure was stopped and it was decided to keep the patient on heparin, converting to Coumadin. INR, April 23, 2001, was reported at 2.0, with plan to discharge patient on April 24, 2001.  On April 24, 2001, initial INR was shown to be 1.0.  A repeat INR later that afternoon was 2.0.  It was decided to keep the patient until April 25, 2001, when INR was 2.4.  Patient was discharged home at that time.  DISCHARGE PLAN:  1. Patient discharged on Coumadin, Dr. Colleen Can. Tennant to follow up.  Next     appointment with Dr. Deborah Chalk for Friday, April 28, 2001, between 8 a.m.     and 12 noon.  2. Follow up with Demetrio Lapping, P.A. for Dr. Deanna Artis. [place] on the     day he is there in one month. Dictated by:   Annie Main, N.P. Attending Physician:  Lesly Dukes DD:  04/25/01 TD:  04/27/01 Job: [id number]

## 2010-11-06 NOTE — H&P (Signed)
. [facility]  Patient:    Marvin Salinas, Marvin Salinas Visit Number: [id number] MRN: [id number]          Service Type: MED Location: [phone] Attending Physician:  Eleanora Neighbor Dictated by:   Colleen Can. Deborah Chalk, M.D. Admit Date:  11/10/2001   CC:         Kerby Nora, M.D.  [facility]   History and Physical  REASON FOR ADMISSION:  Fluctuating hypertension and probable posterior circulation transient ischemic attack.  HISTORY:  Mr. Maese has a history of right cerebellar infarction secondary to basilar artery stenosis in November of 2002.  He had an attempt at stent placement of his basilar artery but was unsuccessful.  He had a recent MRI study which showed progression of the stenosis in the basilar artery and it was recommended earlier this week that he proceed on with a repeat attempt. He has been quite concerned about that.  He has had fluctuating blood pressure and tonight he began to have intermittent episodes of numbness of his face as well as a headache posteriorly.  He has not had any frank dizziness or blurred vision or change in visual symptoms.  There has been no vertigo.  There has been no ataxia.  However, he clearly has had a tendency towards flushing as well as swings of his blood pressure that cannot be attributed entirely to anxiety and is therefore admitted for evaluation.  PAST MEDICAL HISTORY:  His past medical history is complex and includes a history of angioplasty to the right coronary artery in 1989 with angioplasty to the left anterior descending and obtuse marginal in 1992 and subsequent angioplasty to the first diagonal in 1998.  In 2000, he had a stent to the LAD, a stent to the left circumflex and angioplasty to the repeat of the diagonal vessel.  He has had hypercholesterolemia.  He has had a history of recurrent leg cramps, a history of hypertension, history of prior tobacco abuse, history of  kidney stones, history of adult-onset diabetes mellitus and posterior circulation TIA in 2002.  His other problems include a history of renal calculi and sleep apnea in the past.  CURRENT MEDICATIONS: 1. Coumadin 7.5 mg on three days and 5 mg on four days. 2. Pravachol 20 mg a day. 3. Altace 2.5 mg every other day. 4. Xanax p.r.n. 5. Multivitamin. 6. Tricor 160 mg per day. 7. Lopressor 75 mg b.i.d. 8. Aspirin 81 mg a day.  ALLERGIES:  None.  FAMILY HISTORY:  Father died in his early 22s from myocardial infarction. Mother died at age 83 of unknown causes.  He has eight siblings who have a positive incidence of hypertension among them.  SOCIAL HISTORY:  He was previously a smoker.  There is no alcohol use.  He is employed as a Hydrologist.  REVIEW OF SYSTEMS:  Review of systems has been basically unremarkable.  He has not had any anginal pain.  He has had no problems with vascular supply other than as noted with his neck.  He has had some degree of headaches.  He has been under a good bit of stress but a lot of that is improved and he does not really appear to be depressed and was last seen in early May and doing reasonably well.  He has had a history of alopecia that basically has been resolving and occurred either associated with statin drugs or related to the stress of his CNS events.  PHYSICAL EXAMINATION:  GENERAL:  On exam today, he is an anxious 65 year old male.  HEENT:  HEENT shows pupils to be equally round and reactive to light. Extraocular movements are full.  There is no facial palsy.  Oropharynx is unremarkable.  Sensation is intact.  NECK:  Supple without bruits.  There are no masses in the neck.  LUNGS:  Clear.  HEART:  Regular rate and rhythm.  ABDOMEN:  Soft and nontender.  EXTREMITIES:  Extremities are without edema.  NEUROLOGIC:  He has no focal findings and there are no cerebellar findings that I could appreciate.  VITAL  SIGNS:  His blood pressure varied between 180 and 110 to 140/90 during the exam and interview.  OVERALL IMPRESSION: 1. Severe basilar artery stenosis with probable posterior circulation    abnormality resulting in fluctuating blood pressures and extreme fatigue    and unsteadiness. 2. Known coronary artery disease. 3. Hypertension. 4. Adult-onset diabetes mellitus. 5. Hypercholesterolemia. 6. Remote tobacco abuse.  PLAN:  The patient will be admitted.  We will change him from his Coumadin to heparin anticoagulation in anticipation of needing an interventional radiologic procedure.  He will initially be started on his regular blood pressure medicines and given antianxiety medicines and being sedated somewhat in the hospital in hopes of achieving satisfactory blood pressure control. Dictated by:   Colleen Can Deborah Chalk, M.D. Attending Physician:  Eleanora Neighbor DD:  11/11/01 TD:  11/12/01 Job: [id number]

## 2010-11-06 NOTE — Discharge Summary (Signed)
Marvin Salinas. [facility]  Patient:    Marvin Salinas, Marvin Salinas Visit Number: [id number] MRN: [id number]          Service Type: MED Location: [phone] Attending Physician:  Marvin Salinas Dictated by:   Marvin Salinas Marvin Salinas, R.N., A.N.P. Admit Date:  11/10/2001 Discharge Date: 11/16/2001   CC:         [facility]  Marvin Salinas   Discharge Summary  PRIMARY DISCHARGE DIAGNOSIS:  Fluctuating hypertension and probable posterior circulation transient ischemic attack with subsequent repeat cerebral arteriograms, to continue with aggressive medical management.  SECONDARY DISCHARGE DIAGNOSES: 1. Previous right cerebellar infarct secondary to basilar artery stenosis    in November 2002 with unsuccessful angioplasty at that time. 2. Recent magnetic resonance imaging showing progression of the stenosis in    the basilar artery. 3. Chronic hypertension. 4. Atherosclerotic cardiovascular disease with previous angioplasty to    the right coronary in 1989, angioplasty to the left anterior descending    artery and obtuse marginal in 1992 and subsequent angioplasty to the    first diagonal in 1998, stent placement to the left anterior descending    artery in 2000, stent to the left circumflex and angioplasty to the    diagonal vessel in 2000 as well. 5. Ongoing hypercholesterolemia. 6. History of prior tobacco abuse. 7. History of adult onset diabetes mellitus.  HISTORY OF PRESENT ILLNESS:  Marvin Salinas is a 65 year old male who has multiple medical problems.  He has most recently had a recent MRI scan which showed progression of his basilar stenosis and it was recommended earlier in the week that he proceed on with a repeat attempt at percutaneous intervention. He was quite concerned and somewhat anxious. On the night of admission he had fluctuating blood pressure and began to have intermittent episodes of numbness of his face as well a headache  posteriorly. There was no frank dizziness and no frank blurred vision. There was no vertigo and no ataxia.  He clearly had a tendency toward flushing as well as swings in his blood pressure and was subsequently admitted for further evaluation.  Please see dictated history and physical for further patient presentation and profile.  LABORATORY DATA ON ADMISSION:  His INR was therapeutic at 2.6.  Chemistries were normal.  Blood glucose was slightly elevated; however, 127.  Hematocrit was 43.  Chest x-ray showed no evidence of acute cardiopulmonary disease with mild chronic peribronchial thickening.  12 lead echocardiogram was unremarkable for acute changes.  HOSPITAL COURSE:  The patient was admitted. His Coumadin was held and plans were made for him to be started on IV heparin once his INR began to drift down.  We had the interventionalist in radiology, Dr. Kerby Salinas to come and evaluate the patient for possible reattempt at cerebral angioplasty.  The bilateral vertebral artery arteriogram was performed on Nov 14, 2001. Please see the dictated report for full details.  There was an approximate 50% stenosis on the left with an approximate 70% stenosis on the right with a severe complex stenosis of greater than 75 to 85% in the proximal basilar artery and overall felt to have some mild interval worsening. Extensive discussion subsequently ensued with the patient, Dr. Deborah Salinas as well as Dr. Corliss Salinas regarding where to proceed on from this.  At this point in time the patient is opting for aggressive medical management and will be discharged on a combination of Plavix, aspirin and low dose Coumadin. It is felt that if  he continues to have symptoms despite this medical regimen that a reattempt at angioplasty will be in order.  On Nov 16, 2001 he is doing well without complaints.  He has had no reoccurrence of neurological spells.  His Coumadin has been restarted at low dose.  He is  tolerating aspirin and Plavix without problems and is felt to be a stable candidate for discharge today.  CONDITION ON DISCHARGE:  Stable.  DISCHARGE MEDICATIONS: 1. Aspirin 81 mg q.d. 2. Plavix 75 mg q.d. samples have been provided from the office. 3. Will cut his Coumadin back to 5 mg q.d. only. 4. He will resume his Tricor, Pravachol, Altace and Lopressor as taken    before. 5. Will give Xanax on a p.r.n. basis.  ACTIVITY:  His activity is to be progressive.  He may return to work next week.  FOLLOW-UP:  Will ask him to follow up in the office in approximately two weeks and he is asked to call to schedule that appointment. Dictated by:   Marvin Salinas Marvin Salinas, R.N., A.N.P. Attending Physician:  Marvin Salinas DD:  11/07/01 TD:  11/17/01 Job: [id number]

## 2010-11-06 NOTE — H&P (Signed)
[place]. [facility]  Patient:    Marvin Salinas, Marvin Salinas Visit Number: [id number] MRN: [id number]          Service Type: MED Location: [phone] Attending Physician:  Lesly Dukes Dictated by:   Marlan Palau, M.D. Admit Date:  04/16/2001   CC:         Delorse Lek, M.D.  Colleen Can. Deborah Chalk, M.D.   History and Physical  HISTORY OF PRESENT ILLNESS:  Marvin Salinas is a 65 year old right-handed white male born [birth year], with a history of hypertension, coronary artery disease, diabetes, who comes to the [facility] with a two-week history of transient episodes of blurred vision, perioral numbness, left greater than right facial numbness, and gait instability.  There is some question whether the patient may actually slur his speech with the above events.  The patient gets bifrontal headaches with the episodes, which come and go with the spells, lasting three to five minutes.  Recently over the last couple of days, the headache is more persistent and constant.  The patient has had the episodes virtually daily but has had several days within the last week without any events.  Yesterday, the patient experienced five such episodes, today greater than 10.  The patient may note spots in front of the eyes, denies any flashing lights or colors.  The patient denies any prior history of similar events.  The patient comes to the hospital at this point for further evaluation.  A CT scan of the brain shows evidence of a small basal ganglia stroke that is old in the right brain.  No acute events are seen.  Neurology was asked to see this patient for further evaluation.  PAST MEDICAL HISTORY: 1. History of sleep apnea. 2. History of coronary artery disease with stent placement. 3. Hypertension. 4. Diabetes. 5. Renal calculi. 6. Obesity. 7. Episodes of facial numbness, headache as above.  MEDICATIONS:  Aspirin one a day, the patient was  just recently placed on Plavix within the last two days 75 mg a day, Tricor 160 mg daily, Lopressor 50 mg a day.  The patient was just recently taken off of Pravachol for high cholesterol levels.  HABITS:  The patient does not smoke or drink.  ALLERGIES:  No known allergies.  SOCIAL HISTORY:  The patient lives in the [place] area, is engaged to be married, has no children.  Works as a Education administrator.  FAMILY HISTORY:  Notable in that mother died of unknown cause, father died with an MI.  The patient has four sisters, three brothers.  Various siblings have a history of hypertension, diabetes.  REVIEW OF SYSTEMS:  Notable for no recent fevers or chills.  The patient has occasional neck stiffness.  Does note some shortness of breath and some chest pressure at times with the above episodes.  Denies any nausea or vomiting. Denies any problems controlling bowels or bladder, blackout episodes, dizziness, falling episodes.  The patient may stagger, however.  PHYSICAL EXAMINATION:  VITAL SIGNS:  Blood pressure is 176/102, heart rate is 62, respiratory rate 16, temperature afebrile.  GENERAL:  The patient is a moderately obese white male, who is alert and cooperative at the time of examination.  HEENT:  Head is atraumatic.  Eyes:  Pupils are equal, round and reactive to light.  Discs are flat bilaterally.  NECK:  Supple with no carotid bruits noted.  CHEST:  Respiratory examination is clear to auscultation and percussion.  CARDIAC:  A regular rate and rhythm without obvious murmurs or rubs.  ABDOMEN:  No organomegaly or tenderness.  EXTREMITIES:  Without significant edema.  NEUROLOGIC:  The patient is alert and cooperative.  Extraocular movements were full.  Visual fields were full.  Pinprick sensation in the face is symmetric and normal.  Strength of the facial muscles and muscles of head turning and shoulder shrug were symmetric and normal.  Speech is well-enunciated and  not aphasic.  Motor testing reveals 5/5 strength in all fours, good symmetric motor tone is noted throughout.  Sensory testing reveals good symmetry to pinprick sensation throughout with exception of some decrease in the right foot as compared to the left.  The patient has fairly symmetric vibratory sensation throughout.  The patient has good finger-nose-finger, toe-to-finger bilaterally.  Pronator drift was negative.  The patient was not ambulated. Deep tendon reflexes were depressed but symmetric.  Toes are neutral bilaterally.  LABORATORY DATA:  Notable for white count of 6.5, hemoglobin 15.9, hematocrit of 45.7, MCV of 89.5, platelets of 291.  INR of 0.9.  Sodium 139, potassium 3.7, chloride 106, BUN of 13, glucose of 172.  Chest x-ray and EKG are pending.  IMPRESSION: 1. Transient episodes of blurred vision, facial numbness and gait instability,    rule out transient ischemic attack versus migraine headache event. 2. History of hypertension. 3. Coronary artery disease. 4. Diabetes.  The patient has multiple risk factors for stroke-like events.  Will admit this patient for further evaluation of the transient events as above.  The patient has had very frequent events today.  Frequency and duration make this a little bit less likely to be a true transient ischemic attack-type event.  However, do need to rule out vertebrobasilar insufficiency in this case.  PLAN: 1. Admission to [facility]. 2. IV heparin. 3. MRI of the brain. 4. MRI angiogram. 5. Carotid Doppler studies. 6. Will hold aspirin and Plavix for now.  Will follow patient closely while in-house. Dictated by:   Marlan Palau, M.D. Attending Physician:  Lesly Dukes DD:  04/16/01 TD:  04/17/01 Job: [id number]

## 2010-11-06 NOTE — H&P (Signed)
[place]. [facility]  Patient:    Marvin Salinas                        MRN: [id number] Adm. Date:  [id number] Attending:  Eleanora Neighbor Dictator:   Jennet Maduro Earl Gala, R.N., A.N.P.C. CC:         Colleen Can. Deborah Chalk, M.D. at office                         History and Physical  CHIEF COMPLAINT:  Chest pain.  HISTORY OF PRESENT ILLNESS:  Mr. Bienvenue is a very pleasant 65 year old white male who has known atherosclerotic cardiovascular disease.  He presents to the office as a work-in appointment on May 12, 1999 with complaints of recurrent chest pain over the last three days.  It has been primarily occurring at rest, and lasting for about one to two hours.  It is worse with exertion.  It has been associated with shortness of breath.  He has been noted to have some decrease in energy last week, but did work excessively hard.  He was subsequently seen in the office, and EKG  shows lateral T wave changes, and he was subsequently referred for further evaluation.  PAST MEDICAL HISTORY:  1. ASCVD with a prior history of angioplasty, most recently to the first diagonal vessel, in October 1998, as well as to the LAD and obtuse  marginal in August 1992.  2. Hypertensive heart disease.  3. Hypercholesterolemia. 4. History of tobacco use.  5. History of kidney stones.  ALLERGIES:  None.  CURRENT MEDICATIONS: 1. Lopressor 50 b.i.d. 2. Aspirin daily. 3. Tricor 200 mg a day.  WEIGHT:  192 pounds.  FAMILY HISTORY:  Father died in his early 23s with myocardial infarction. Mother died at 24 of unknown causes.  He has eight siblings who have a positive incidence of hypertension among them.  SOCIAL HISTORY:  Positive prior smoking use.  There is no alcohol use.  He is employed as a Hydrologist.  REVIEW OF SYSTEMS:  Is otherwise basically unremarkable.  He was last seen in our office in August 1999, and was felt to be doing well.   He had been under a considerable amount of stress since that time.  PHYSICAL EXAMINATION:  GENERAL:  He is pleasant and in no acute distress.  He does continue to have some chest discomfort at rest.  VITAL SIGNS:  Blood pressure 150/100 sitting, 150/110 standing.  Heart rate 60,  respirations 18.  He is afebrile.  SKIN:  Warm and dry, color is unremarkable.  LUNGS:  Clear.  HEART:  Shows a regular rhythm.  ABDOMEN:  Soft, positive bowel sounds, nontender.  EXTREMITIES:  Without edema.  RECTAL:  Deferred.  NEUROLOGIC:  Intact, no gross focal deficits.  LABORATORY DATA:  EKG showing normal sinus rhythm with lateral T wave changes.  Other labs are pending.  IMPRESSION: 1. Recurrent chest discomfort, most likely cardiac in origin. 2. Known ASCVD with prior history of revascularization. 3. Hyperlipidemia.  PLAN:  Will proceed on with admission as well as cardiac catheterization today.  The risks, procedure, and benefits have been explained, and he is willing to proceed on. DD:  05/12/99 TD:  05/12/99 Job: [id number]

## 2010-12-21 ENCOUNTER — Other Ambulatory Visit: Payer: Self-pay | Admitting: Cardiology

## 2010-12-21 NOTE — Telephone Encounter (Signed)
Med refill

## 2011-02-04 ENCOUNTER — Other Ambulatory Visit: Payer: Self-pay | Admitting: Cardiology

## 2011-02-04 DIAGNOSIS — E781 Pure hyperglyceridemia: Secondary | ICD-10-CM

## 2011-02-20 DIAGNOSIS — J189 Pneumonia, unspecified organism: Secondary | ICD-10-CM

## 2011-02-20 HISTORY — DX: Pneumonia, unspecified organism: J18.9

## 2011-03-05 ENCOUNTER — Emergency Department (HOSPITAL_COMMUNITY)
Admission: EM | Admit: 2011-03-05 | Discharge: 2011-03-06 | Disposition: A | Discharge status: Home / Self Care | Payer: Medicare Other | Attending: Emergency Medicine | Admitting: Emergency Medicine

## 2011-03-05 ENCOUNTER — Emergency Department (HOSPITAL_COMMUNITY): Payer: Medicare Other

## 2011-03-05 DIAGNOSIS — Z794 Long term (current) use of insulin: Secondary | ICD-10-CM | POA: Insufficient documentation

## 2011-03-05 DIAGNOSIS — I2510 Atherosclerotic heart disease of native coronary artery without angina pectoris: Secondary | ICD-10-CM | POA: Insufficient documentation

## 2011-03-05 DIAGNOSIS — R300 Dysuria: Secondary | ICD-10-CM | POA: Insufficient documentation

## 2011-03-05 DIAGNOSIS — R112 Nausea with vomiting, unspecified: Secondary | ICD-10-CM | POA: Insufficient documentation

## 2011-03-05 DIAGNOSIS — I10 Essential (primary) hypertension: Secondary | ICD-10-CM | POA: Insufficient documentation

## 2011-03-05 DIAGNOSIS — Z79899 Other long term (current) drug therapy: Secondary | ICD-10-CM | POA: Insufficient documentation

## 2011-03-05 DIAGNOSIS — R5381 Other malaise: Secondary | ICD-10-CM | POA: Insufficient documentation

## 2011-03-05 DIAGNOSIS — Z7982 Long term (current) use of aspirin: Secondary | ICD-10-CM | POA: Insufficient documentation

## 2011-03-05 DIAGNOSIS — R630 Anorexia: Secondary | ICD-10-CM | POA: Insufficient documentation

## 2011-03-05 DIAGNOSIS — J189 Pneumonia, unspecified organism: Secondary | ICD-10-CM | POA: Insufficient documentation

## 2011-03-05 DIAGNOSIS — R509 Fever, unspecified: Secondary | ICD-10-CM | POA: Insufficient documentation

## 2011-03-05 DIAGNOSIS — IMO0001 Reserved for inherently not codable concepts without codable children: Secondary | ICD-10-CM | POA: Insufficient documentation

## 2011-03-05 DIAGNOSIS — E119 Type 2 diabetes mellitus without complications: Secondary | ICD-10-CM | POA: Insufficient documentation

## 2011-03-05 DIAGNOSIS — E7800 Pure hypercholesterolemia, unspecified: Secondary | ICD-10-CM | POA: Insufficient documentation

## 2011-03-05 LAB — URINE MICROSCOPIC-ADD ON

## 2011-03-05 LAB — DIFFERENTIAL
Basophils Absolute: 0 10*3/uL (ref 0.0–0.1)
Basophils Relative: 0 % (ref 0–1)
Eosinophils Absolute: 0 10*3/uL (ref 0.0–0.7)
Eosinophils Relative: 0 % (ref 0–5)
Lymphocytes Relative: 4 % — ABNORMAL LOW (ref 12–46)
Lymphs Abs: 0.5 10*3/uL — ABNORMAL LOW (ref 0.7–4.0)
Monocytes Absolute: 1 10*3/uL (ref 0.1–1.0)
Monocytes Relative: 8 % (ref 3–12)
Neutro Abs: 11.4 10*3/uL — ABNORMAL HIGH (ref 1.7–7.7)
Neutrophils Relative %: 88 % — ABNORMAL HIGH (ref 43–77)

## 2011-03-05 LAB — BASIC METABOLIC PANEL
BUN: 28 mg/dL — ABNORMAL HIGH (ref 6–23)
CO2: 26 mEq/L (ref 19–32)
Calcium: 9.8 mg/dL (ref 8.4–10.5)
Chloride: 90 mEq/L — ABNORMAL LOW (ref 96–112)
Creatinine, Ser: 1.66 mg/dL — ABNORMAL HIGH (ref 0.50–1.35)
GFR calc Af Amer: 50 mL/min — ABNORMAL LOW (ref >60)
GFR calc non Af Amer: 42 mL/min — ABNORMAL LOW (ref >60)
Glucose, Bld: 284 mg/dL — ABNORMAL HIGH (ref 70–99)
Potassium: 4 mEq/L (ref 3.5–5.1)
Sodium: 129 mEq/L — ABNORMAL LOW (ref 135–145)

## 2011-03-05 LAB — CBC
HCT: 39.5 % (ref 39.0–52.0)
Hemoglobin: 14.4 g/dL (ref 13.0–17.0)
MCH: 31.4 pg (ref 26.0–34.0)
MCHC: 36.5 g/dL — ABNORMAL HIGH (ref 30.0–36.0)
MCV: 86.2 fL (ref 78.0–100.0)
Platelets: 239 10*3/uL (ref 150–400)
RBC: 4.58 MIL/uL (ref 4.22–5.81)
RDW: 13.1 % (ref 11.5–15.5)
WBC: 12.9 10*3/uL — ABNORMAL HIGH (ref 4.0–10.5)

## 2011-03-05 LAB — URINALYSIS, ROUTINE W REFLEX MICROSCOPIC
Bilirubin Urine: NEGATIVE
Glucose, UA: >1000 mg/dL — AB
Ketones, ur: 15 mg/dL — AB
Leukocytes, UA: NEGATIVE
Nitrite: NEGATIVE
Protein, ur: 100 mg/dL — AB
Specific Gravity, Urine: 1.031 — ABNORMAL HIGH (ref 1.005–1.030)
Urobilinogen, UA: 0.2 mg/dL (ref 0.0–1.0)
pH: 5 (ref 5.0–8.0)

## 2011-03-15 ENCOUNTER — Ambulatory Visit (INDEPENDENT_AMBULATORY_CARE_PROVIDER_SITE_OTHER): Payer: Medicare Other | Admitting: Nurse Practitioner

## 2011-03-15 ENCOUNTER — Encounter: Payer: Self-pay | Admitting: Nurse Practitioner

## 2011-03-15 ENCOUNTER — Other Ambulatory Visit: Payer: Medicare Other | Admitting: *Deleted

## 2011-03-15 VITALS — BP 132/88 | HR 64 | Ht 68.0 in | Wt 191.6 lb

## 2011-03-15 DIAGNOSIS — E785 Hyperlipidemia, unspecified: Secondary | ICD-10-CM

## 2011-03-15 DIAGNOSIS — I10 Essential (primary) hypertension: Secondary | ICD-10-CM

## 2011-03-15 DIAGNOSIS — I2510 Atherosclerotic heart disease of native coronary artery without angina pectoris: Secondary | ICD-10-CM

## 2011-03-15 DIAGNOSIS — Z951 Presence of aortocoronary bypass graft: Secondary | ICD-10-CM

## 2011-03-15 LAB — BASIC METABOLIC PANEL
BUN: 22 mg/dL (ref 6–23)
CO2: 28 mEq/L (ref 19–32)
Calcium: 9.1 mg/dL (ref 8.4–10.5)
Chloride: 103 mEq/L (ref 96–112)
Creatinine, Ser: 1.4 mg/dL (ref 0.4–1.5)
GFR: 52.58 mL/min — ABNORMAL LOW (ref >60.00)
Glucose, Bld: 130 mg/dL — ABNORMAL HIGH (ref 70–99)
Potassium: 4.6 mEq/L (ref 3.5–5.1)
Sodium: 140 mEq/L (ref 135–145)

## 2011-03-15 NOTE — Assessment & Plan Note (Signed)
Labs are checked by his PCP.  

## 2011-03-15 NOTE — Patient Instructions (Addendum)
I think you are doing well. I want to check recheck your sodium and kidney function today We will see you back in 4 months Call for any problems Stay active

## 2011-03-15 NOTE — Assessment & Plan Note (Signed)
He is doing well. No chest pain or other cardiac complaints. Encouraged regular exercise. Will see back in 4 months. Patient is agreeable to this plan and will call if any problems develop in the interim.

## 2011-03-15 NOTE — Progress Notes (Signed)
[name] Date of Birth: Nov 12, 1945   History of Present Illness: Marvin Salinas is seen back today for his 4 month visit. He is seen for Dr. Elease Hashimoto. He is a former patient of Dr. Ronnald Nian. He is doing well. He did have a recent bout of pneumonia with dehydration about 10 days ago. He will be getting a repeat CXR today. His sodium was low and he had worsening renal function. He is getting better from that standpoint. He has no cardiac complaints. No chest pain or shortness of breath. He will have some back pain. He is still doing a little painting. He is tolerating his medicines. He now sees Dr. Beverley Fiedler for his primary care. She has done recent labs. Insulin has been increased.   Current Outpatient Prescriptions on File Prior to Visit  Medication Sig Dispense Refill  . aspirin 81 MG tablet Take 81 mg by mouth daily.        . clopidogrel (PLAVIX) 75 MG tablet Take 75 mg by mouth daily.        . Hypromellose 0.3 % SOLN 1 drop. Into each eye two times daily       . insulin glargine (LANTUS) 100 UNIT/ML injection Inject into the skin as directed.        . metFORMIN (GLUCOPHAGE) 1000 MG tablet Take 1,000 mg by mouth 2 (two) times daily with a meal.        . metoprolol (LOPRESSOR) 50 MG tablet Take 50 mg by mouth 2 (two) times daily. 1 & 1/2 TABLETS 2 (TWO) TIMES DAILY       . nitroGLYCERIN (NITROSTAT) 0.4 MG SL tablet Place 1 tablet (0.4 mg total) under the tongue every 5 (five) minutes as needed.  25 tablet  11  . olmesartan-hydrochlorothiazide (BENICAR HCT) 20-12.5 MG per tablet Take 1 tablet by mouth daily.        . pravastatin (PRAVACHOL) 40 MG tablet TAKE 1 TABLET BY MOUTH EVERY DAY  30 tablet  3  . TRICOR 145 MG tablet TAKE 1 TABLET BY MOUTH EVERY DAY  90 tablet  1    Allergies  Allergen Reactions  . Codeine Nausea And Vomiting  . Latex Hives  . Lisinopril Cough    Past Medical History  Diagnosis Date  . Hypertension   . Hyperlipidemia   . Diabetes mellitus     on insulin    . Ischemic heart disease     prior PCI to RCA in 1989. S/P PCI to LAD and OM in 1992. S/P PCI to first DX in 2000. S/P CABG x 3 in May 2011  . Depression   . Sleep apnea   . Peripheral neuropathy   . History of renal calculi   . Back pain   . S/P CABG x 21 Oct 2009  . Cerebral vascular disease     with prior TIA's  . Basilar artery stenosis     on chronic Plavix  . Pneumonia Sept 2012    Past Surgical History  Procedure Date  . Coronary artery bypass graft 11/12/2009    LIMA to LAD, SVG to OM and SVG to RCA  . Angioplasty 1989    right coronary artery  . Angioplasty 1992    LAD and OM  . Angioplasty 1998    First DX  . Coronary stent placement 2000    Stent to LAD/Circumflex with angioplasty to first diagonal     History  Smoking status  . Former Smoker  .  Types: Cigarettes  . Quit date: 10/19/2009  Smokeless tobacco  . Never Used    History  Alcohol Use No    Family History  Problem Relation Age of Onset  . Hypertension Father   . Heart disease Father   . Asthma Brother   . Stroke      Uncle  . Heart attack Father     Review of Systems: The review of systems is positive for some mild back pain. He also notes an incisional hernia at the lower portion of his sternal scar if he strains or stands.  All other systems were reviewed and are negative.  Physical Exam: BP 132/88  Pulse 64  Ht 5\' 8"  (1.727 m)  Wt 191 lb 9.6 oz (86.909 kg)  BMI 29.13 kg/m2 Patient is very pleasant and in no acute distress. Skin is warm and dry. Color is normal.  HEENT is unremarkable. Normocephalic/atraumatic. PERRL. Sclera are nonicteric. Neck is supple. No masses. No JVD. Lungs are clear. Cardiac exam shows a regular rate and rhythm. Sternum looks ok. He does have a small incisional hernia at the lower aspect of his scar. It is reducible.  Abdomen is soft. Extremities are without edema. Gait and ROM are intact. No gross neurologic deficits noted.   LABORATORY  DATA:   Assessment / Plan:

## 2011-03-15 NOTE — Assessment & Plan Note (Signed)
Blood pressure looks good. He reports lower readings at home. No change in his medicines.

## 2011-03-17 ENCOUNTER — Telehealth: Payer: Self-pay | Admitting: *Deleted

## 2011-03-17 NOTE — Telephone Encounter (Signed)
Message copied by Lorayne Bender on Wed Mar 17, 2011 10:14 AM ------      Message from: Rosalio Macadamia      Created: Mon Mar 15, 2011  3:51 PM       Ok to report. Labs are satisfactory. Much better!

## 2011-03-17 NOTE — Telephone Encounter (Signed)
Notified of lab results  Will send copy to Dr. Barbaraann Barthel

## 2011-04-05 ENCOUNTER — Encounter: Payer: Self-pay | Admitting: Nurse Practitioner

## 2011-04-22 ENCOUNTER — Other Ambulatory Visit: Payer: Self-pay | Admitting: Cardiology

## 2011-05-21 ENCOUNTER — Other Ambulatory Visit: Payer: Self-pay | Admitting: Cardiology

## 2011-05-25 ENCOUNTER — Encounter: Payer: Self-pay | Admitting: Nurse Practitioner

## 2011-07-15 ENCOUNTER — Ambulatory Visit (INDEPENDENT_AMBULATORY_CARE_PROVIDER_SITE_OTHER): Payer: Medicare Other | Admitting: Nurse Practitioner

## 2011-07-15 ENCOUNTER — Encounter: Payer: Self-pay | Admitting: Nurse Practitioner

## 2011-07-15 VITALS — BP 150/80 | HR 64 | Ht 68.0 in | Wt 200.0 lb

## 2011-07-15 DIAGNOSIS — I2510 Atherosclerotic heart disease of native coronary artery without angina pectoris: Secondary | ICD-10-CM

## 2011-07-15 DIAGNOSIS — I10 Essential (primary) hypertension: Secondary | ICD-10-CM

## 2011-07-15 DIAGNOSIS — E785 Hyperlipidemia, unspecified: Secondary | ICD-10-CM

## 2011-07-15 DIAGNOSIS — I679 Cerebrovascular disease, unspecified: Secondary | ICD-10-CM

## 2011-07-15 MED ORDER — PRAVASTATIN SODIUM 80 MG PO TABS: 80.0000 mg | ORAL_TABLET | Freq: Every evening | ORAL | Status: DC

## 2011-07-15 NOTE — Patient Instructions (Signed)
We are going to arrange for carotid dopplers to look at the blood flow to your head.  We will get you an appointment to see Dr. Sandria Manly   I will see you in 3 months.  Call the [facility] office at [phone] if you have any questions, problems or concerns.

## 2011-07-15 NOTE — Assessment & Plan Note (Signed)
He has had his Pravachol increased. I have refilled it today.

## 2011-07-15 NOTE — Assessment & Plan Note (Signed)
He has an extensive history of CAD with remote PCI and subsequent CABG. He is doing well clinically. Will continue to try and modify risk factors. I will see him in 3 months.

## 2011-07-15 NOTE — Assessment & Plan Note (Signed)
No medicines yet today. Has had satisfactory outpatient control.

## 2011-07-15 NOTE — Assessment & Plan Note (Signed)
He has had a very brief episode of dizziness. This clearly got his attention and frightened him. We will check carotid dopplers and will refer him back to Dr. Sandria Manly per [name]'s request.

## 2011-07-15 NOTE — Progress Notes (Signed)
Marvin Salinas Date of Birth: 1945-07-07 Medical Record #[id number]  History of Present Illness: Marvin Salinas is seen today for a follow up visit. It is his 4 month check. He has a known history of CAD with prior CABG in 2011. Other issues include HTN, hyperlipidemia, diabetes and hyperlipidemia. He has extensive cerebral vascular disease with prior TIA's. He is maintained on chronic Plavix and aspirin therapy.   He comes in today. He is doing well from his heart standpoint. No chest pain or shortness of breath. He has had his labs from his PCP and those are reviewed. She has increased Marvin Salinas's insulin and his pravachol. He is tolerating his medicines. His only complaint is that of a brief episode of dizziness recently. It caught his attention. It was very short lived. It reminded him of how he felt prior to his TIA's that he has had in the remote past. He does have known basilar artery stenosis. He used to be on Coumadin in addition to his Plavix and Aspirin. He denies any other neurologic symptoms. He would like to go back and see Dr. Sandria Manly who has followed him in the past.   Current Outpatient Prescriptions on File Prior to Visit  Medication Sig Dispense Refill  . aspirin 81 MG tablet Take 81 mg by mouth daily.        Marland Kitchen BENICAR HCT 20-12.5 MG per tablet TAKE 1 TABLET BY MOUTH EVERY DAY  30 tablet  6  . clopidogrel (PLAVIX) 75 MG tablet Take 75 mg by mouth daily.        . Hypromellose 0.3 % SOLN 1 drop. Into each eye two times daily       . insulin glargine (LANTUS) 100 UNIT/ML injection Inject into the skin as directed.        . metFORMIN (GLUCOPHAGE) 1000 MG tablet Take 1,000 mg by mouth 2 (two) times daily with a meal.        . metoprolol (LOPRESSOR) 50 MG tablet Take 75 mg by mouth 2 (two) times daily.       . nitroGLYCERIN (NITROSTAT) 0.4 MG SL tablet Place 1 tablet (0.4 mg total) under the tongue every 5 (five) minutes as needed.  25 tablet  11  . TRICOR 145 MG tablet TAKE 1 TABLET BY MOUTH EVERY  DAY  90 tablet  1  . DISCONTD: pravastatin (PRAVACHOL) 40 MG tablet TAKE 1 TABLET BY MOUTH EVERY DAY  30 tablet  3    Allergies  Allergen Reactions  . Codeine Nausea And Vomiting  . Latex Hives  . Lisinopril Cough    Past Medical History  Diagnosis Date  . Hypertension   . Hyperlipidemia   . Diabetes mellitus     on insulin  . Ischemic heart disease     prior PCI to RCA in 1989. S/P PCI to LAD and OM in 1992. S/P PCI to first DX in 2000. S/P CABG x 3 in May 2011  . Depression   . Sleep apnea   . Peripheral neuropathy   . History of renal calculi   . Back pain   . S/P CABG x 21 Oct 2009  . Cerebral vascular disease     with prior TIA's; followed by Dr. Sandria Manly  . Basilar artery stenosis     on chronic Plavix  . Pneumonia Sept 2012    Past Surgical History  Procedure Date  . Coronary artery bypass graft 11/12/2009    LIMA to LAD, SVG to OM and  SVG to RCA  . Angioplasty 1989    right coronary artery  . Angioplasty 1992    LAD and OM  . Angioplasty 1998    First DX  . Coronary stent placement 2000    Stent to LAD/Circumflex with angioplasty to first diagonal     History  Smoking status  . Former Smoker  . Types: Cigarettes  . Quit date: 10/19/2009  Smokeless tobacco  . Never Used    History  Alcohol Use No    Family History  Problem Relation Age of Onset  . Hypertension Father   . Heart disease Father   . Asthma Brother   . Stroke      Uncle  . Heart attack Father     Review of Systems: The review of systems is positive for lone episode of dizziness.  No cardiac complaints. All other systems were reviewed and are negative.  Physical Exam: BP 150/80  Pulse 64  Ht 5\' 8"  (1.727 m)  Wt 200 lb (90.719 kg)  BMI 30.41 kg/m2 Patient is very pleasant and in no acute distress. Skin is warm and dry. Color is normal.  HEENT is unremarkable. Normocephalic/atraumatic. PERRL. Sclera are nonicteric. Neck is supple. Bilateral bruits noted. No masses. No JVD. Lungs  are clear. Cardiac exam shows a regular rate and rhythm. Abdomen is soft. Extremities are without edema. Gait and ROM are intact. No gross neurologic deficits noted.  LABORATORY DATA: Reviewed from Dr. Barbaraann Barthel' office   Assessment / Plan:

## 2011-08-11 ENCOUNTER — Other Ambulatory Visit: Payer: Self-pay | Admitting: *Deleted

## 2011-08-11 DIAGNOSIS — I6529 Occlusion and stenosis of unspecified carotid artery: Secondary | ICD-10-CM

## 2011-08-26 ENCOUNTER — Encounter (INDEPENDENT_AMBULATORY_CARE_PROVIDER_SITE_OTHER): Payer: Medicare Other

## 2011-08-26 DIAGNOSIS — I6529 Occlusion and stenosis of unspecified carotid artery: Secondary | ICD-10-CM

## 2011-08-26 DIAGNOSIS — R42 Dizziness and giddiness: Secondary | ICD-10-CM

## 2011-08-30 NOTE — Progress Notes (Signed)
Sent to pcp and Dr love

## 2011-10-13 ENCOUNTER — Ambulatory Visit (INDEPENDENT_AMBULATORY_CARE_PROVIDER_SITE_OTHER): Payer: Medicare Other | Admitting: Nurse Practitioner

## 2011-10-13 ENCOUNTER — Other Ambulatory Visit: Payer: Self-pay | Admitting: Neurology

## 2011-10-13 ENCOUNTER — Ambulatory Visit: Payer: Medicare Other | Admitting: Nurse Practitioner

## 2011-10-13 ENCOUNTER — Encounter: Payer: Self-pay | Admitting: Nurse Practitioner

## 2011-10-13 VITALS — BP 122/70 | HR 64 | Ht 68.0 in | Wt 198.0 lb

## 2011-10-13 DIAGNOSIS — I10 Essential (primary) hypertension: Secondary | ICD-10-CM

## 2011-10-13 DIAGNOSIS — I679 Cerebrovascular disease, unspecified: Secondary | ICD-10-CM

## 2011-10-13 DIAGNOSIS — I2510 Atherosclerotic heart disease of native coronary artery without angina pectoris: Secondary | ICD-10-CM

## 2011-10-13 NOTE — Assessment & Plan Note (Signed)
Blood pressure is doing well. No change with his current therapy.

## 2011-10-13 NOTE — Assessment & Plan Note (Signed)
No further dizzy spells. Carotid duplex results were shared with him and he is given a copy to show to Dr. Sandria Manly. Marvin Salinas does however have known basilar artery stenosis. He is seeing Dr. Sandria Manly tomorrow.

## 2011-10-13 NOTE — Assessment & Plan Note (Signed)
No chest pain or other symptoms reported. Reminded to stay active. Will see him back in about 4 months.

## 2011-10-13 NOTE — Patient Instructions (Addendum)
You are doing well from our standpoint.  Stay on your current medicines  Stay active  We will see you in 4 months. Dr. Elease Hashimoto is your new cardiologist  Call the [facility] office at [phone] if you have any questions, problems or concerns.

## 2011-10-13 NOTE — Progress Notes (Signed)
[name] Date of Birth: 1946/04/01 Medical Record #[id number]  History of Present Illness: Marvin Salinas is seen back today for a 3 month check. He is seen for Dr. Elease Hashimoto. He is a former patient of Dr. Ronnald Nian. He has known CAD with prior CABG back in 2011. Other issues include HTN, HLD, DM and extensive cerebral vascular disease with prior TIA's and known basilar artery stenosis. He is on chronic Plavix and aspirin therapy. Was on coumadin in the remote past.  He comes in today. He is doing well. When I saw him last time he had reported a "dizzy spell" that had really frightened him. We updated his carotid doppler which shows 40 to 60% bilateral ICA stenoses. He is seeing Dr. Sandria Manly tomorrow. He denies chest pain. Blood sugars have been running high and he is on more insulin. Blood pressure has been doing well. No further dizzy spells and overall he is feeling good. His lipids and labs are checked by his PCP.  Current Outpatient Prescriptions on File Prior to Visit  Medication Sig Dispense Refill  . aspirin 81 MG tablet Take 81 mg by mouth daily.        Marland Kitchen BENICAR HCT 20-12.5 MG per tablet TAKE 1 TABLET BY MOUTH EVERY DAY  30 tablet  6  . clopidogrel (PLAVIX) 75 MG tablet Take 75 mg by mouth daily.        . Hypromellose 0.3 % SOLN 1 drop. Into each eye two times daily       . insulin glargine (LANTUS) 100 UNIT/ML injection Inject into the skin as directed.        . metFORMIN (GLUCOPHAGE) 1000 MG tablet Take 1,000 mg by mouth 2 (two) times daily with a meal.        . metoprolol (LOPRESSOR) 50 MG tablet Take 75 mg by mouth 2 (two) times daily.       . nitroGLYCERIN (NITROSTAT) 0.4 MG SL tablet Place 1 tablet (0.4 mg total) under the tongue every 5 (five) minutes as needed.  25 tablet  11  . pravastatin (PRAVACHOL) 80 MG tablet Take 1 tablet (80 mg total) by mouth every evening.  90 tablet  3  . TRICOR 145 MG tablet TAKE 1 TABLET BY MOUTH EVERY DAY  90 tablet  1    Allergies  Allergen Reactions    . Codeine Nausea And Vomiting  . Latex Hives  . Lisinopril Cough    Past Medical History  Diagnosis Date  . Hypertension   . Hyperlipidemia   . Diabetes mellitus     on insulin  . Ischemic heart disease     prior PCI to RCA in 1989. S/P PCI to LAD and OM in 1992. S/P PCI to first DX in 2000. S/P CABG x 3 in May 2011  . Depression   . Sleep apnea   . Peripheral neuropathy   . History of renal calculi   . Back pain   . S/P CABG x 21 Oct 2009  . Cerebral vascular disease     with prior TIA's; followed by Dr. Sandria Manly  . Basilar artery stenosis     on chronic Plavix  . Pneumonia Sept 2012    Past Surgical History  Procedure Date  . Coronary artery bypass graft 11/12/2009    LIMA to LAD, SVG to OM and SVG to RCA  . Angioplasty 1989    right coronary artery  . Angioplasty 1992    LAD and OM  . Angioplasty  1998    First DX  . Coronary stent placement 2000    Stent to LAD/Circumflex with angioplasty to first diagonal     History  Smoking status  . Former Smoker  . Types: Cigarettes  . Quit date: 10/19/2009  Smokeless tobacco  . Never Used    History  Alcohol Use No    Family History  Problem Relation Age of Onset  . Hypertension Father   . Heart disease Father   . Asthma Brother   . Stroke      Uncle  . Heart attack Father     Review of Systems: The review of systems is per the HPI.  All other systems were reviewed and are negative.  Physical Exam: BP 122/70  Pulse 64  Ht 5\' 8"  (1.727 m)  Wt 198 lb (89.812 kg)  BMI 30.11 kg/m2 Patient is very pleasant and in no acute distress. Skin is warm and dry. Color is normal.  HEENT is unremarkable. Normocephalic/atraumatic. PERRL. Sclera are nonicteric. Neck is supple. No masses. No JVD. Lungs are clear. Cardiac exam shows a regular rate and rhythm. Abdomen is soft. Extremities are without edema. Gait and ROM are intact. No gross neurologic deficits noted.  LABORATORY DATA:     Assessment / Plan:

## 2011-10-21 ENCOUNTER — Other Ambulatory Visit: Payer: Self-pay | Admitting: Neurology

## 2011-10-21 DIAGNOSIS — G459 Transient cerebral ischemic attack, unspecified: Secondary | ICD-10-CM

## 2011-10-21 DIAGNOSIS — R42 Dizziness and giddiness: Secondary | ICD-10-CM

## 2011-10-21 DIAGNOSIS — I651 Occlusion and stenosis of basilar artery: Secondary | ICD-10-CM

## 2011-10-27 ENCOUNTER — Ambulatory Visit
Admission: RE | Admit: 2011-10-27 | Discharge: 2011-10-27 | Disposition: A | Discharge status: Home / Self Care | Payer: Medicare Other | Source: Ambulatory Visit | Attending: Neurology | Admitting: Neurology

## 2011-10-27 DIAGNOSIS — R42 Dizziness and giddiness: Secondary | ICD-10-CM

## 2011-10-27 DIAGNOSIS — I651 Occlusion and stenosis of basilar artery: Secondary | ICD-10-CM

## 2011-10-27 DIAGNOSIS — G459 Transient cerebral ischemic attack, unspecified: Secondary | ICD-10-CM

## 2011-11-09 ENCOUNTER — Encounter: Payer: Self-pay | Admitting: *Deleted

## 2011-11-09 ENCOUNTER — Telehealth: Payer: Self-pay | Admitting: Cardiovascular Disease

## 2011-11-09 ENCOUNTER — Other Ambulatory Visit: Payer: Self-pay | Admitting: Cardiology

## 2011-11-09 NOTE — Telephone Encounter (Signed)
Marvin Salinas needs to stop plavix and she will leave at 4pm today

## 2011-11-09 NOTE — Telephone Encounter (Signed)
New problem:  Per Toni Amend, please advise when to stop plavix due to colonoscopy on 5/29

## 2011-11-25 ENCOUNTER — Other Ambulatory Visit: Payer: Self-pay | Admitting: Cardiology

## 2011-11-25 NOTE — Telephone Encounter (Signed)
..   Requested Prescriptions   Pending Prescriptions Disp Refills  . metoprolol (LOPRESSOR) 50 MG tablet [Pharmacy Med Name: METOPROLOL TARTA 50MG  TAB] 270 tablet 3    Sig: TAKE 1 AND 1/2 TABLETS BY MOUTH TWICE DAILY

## 2011-12-30 ENCOUNTER — Other Ambulatory Visit: Payer: Self-pay | Admitting: Nurse Practitioner

## 2011-12-30 NOTE — Telephone Encounter (Signed)
Refilled benicar 

## 2012-02-11 ENCOUNTER — Other Ambulatory Visit: Payer: Self-pay | Admitting: *Deleted

## 2012-02-14 ENCOUNTER — Encounter: Payer: Self-pay | Admitting: Cardiovascular Disease

## 2012-02-14 ENCOUNTER — Ambulatory Visit (INDEPENDENT_AMBULATORY_CARE_PROVIDER_SITE_OTHER): Payer: Medicare Other | Admitting: Cardiovascular Disease

## 2012-02-14 VITALS — BP 119/84 | HR 55 | Ht 68.0 in | Wt 190.1 lb

## 2012-02-14 DIAGNOSIS — I2510 Atherosclerotic heart disease of native coronary artery without angina pectoris: Secondary | ICD-10-CM

## 2012-02-14 DIAGNOSIS — I10 Essential (primary) hypertension: Secondary | ICD-10-CM

## 2012-02-14 NOTE — Assessment & Plan Note (Signed)
Marvin Salinas is doing well. [name] not having any episodes of chest pain or shortness of breath.  His lipid levels are managed by Dr. Ephriam Knuckles.  I seen again in 6 months for followup visit.

## 2012-02-14 NOTE — Progress Notes (Signed)
Marvin Salinas Date of Birth  1945-12-11       [facility] Office [address]  [address] [place], [place]  [address]   [place], [place]  [address] [phone]     [phone]   Fax  [phone]    Fax [phone]  Problem List: 1. Coronary artery disease-status post CABG-2001 2. Hypertension 3. Hyperlipidemia 4. Diabetes mellitus 5. Extensive cerebrovascular disease with prior TIAs and known basilar artery stenosis  History of Present Illness: Marvin Salinas is seen back today for a 3 month check.  He is a former patient of Dr. Ronnald Nian. He has known CAD with prior CABG back in 2011. Other issues include HTN, HLD, DM and extensive cerebral vascular disease with prior TIA's and known basilar artery stenosis. He is on chronic Plavix and aspirin therapy. Was on coumadin in the remote past.  Pt has done well. Complains of back pain.  He gets some regular exercise - walks on occasion.  Current Outpatient Prescriptions on File Prior to Visit  Medication Sig Dispense Refill  . aspirin 81 MG tablet Take 81 mg by mouth daily.        Marland Kitchen BENICAR HCT 20-12.5 MG per tablet TAKE 1 TABLET BY MOUTH EVERY DAY  90 tablet  3  . clopidogrel (PLAVIX) 75 MG tablet TAKE 1 TABLET BY MOUTH EVERY DAY  90 tablet  3  . Hypromellose 0.3 % SOLN 1 drop. Into each eye two times daily       . insulin glargine (LANTUS) 100 UNIT/ML injection Inject 25 Units into the skin 2 (two) times daily.       . metFORMIN (GLUCOPHAGE) 1000 MG tablet Take 1,000 mg by mouth 2 (two) times daily with a meal.        . nitroGLYCERIN (NITROSTAT) 0.4 MG SL tablet Place 1 tablet (0.4 mg total) under the tongue every 5 (five) minutes as needed.  25 tablet  11  . pravastatin (PRAVACHOL) 80 MG tablet Take 1 tablet (80 mg total) by mouth every evening.  90 tablet  3  . TRICOR 145 MG tablet TAKE 1 TABLET BY MOUTH EVERY DAY  90 tablet  1  . DISCONTD: metoprolol (LOPRESSOR) 50 MG tablet TAKE 1  AND 1/2 TABLETS BY MOUTH TWICE DAILY  270 tablet  3   metoprolol 50 mg twice a day  Allergies  Allergen Reactions  . Codeine Nausea And Vomiting  . Latex Hives  . Lisinopril Cough    Past Medical History  Diagnosis Date  . Hypertension   . Hyperlipidemia   . Diabetes mellitus     on insulin  . Ischemic heart disease     prior PCI to RCA in 1989. S/P PCI to LAD and OM in 1992. S/P PCI to first DX in 2000. S/P CABG x 3 in May 2011  . Depression   . Sleep apnea   . Peripheral neuropathy   . History of renal calculi   . Back pain   . S/P CABG x 21 Oct 2009  . Cerebral vascular disease     with prior TIA's; followed by Dr. Sandria Manly  . Basilar artery stenosis     on chronic Plavix  . Pneumonia Sept 2012    Past Surgical History  Procedure Date  . Coronary artery bypass graft 11/12/2009    LIMA to LAD, SVG to OM and SVG to RCA  . Angioplasty 1989  right coronary artery  . Angioplasty 1992    LAD and OM  . Angioplasty 1998    First DX  . Coronary stent placement 2000    Stent to LAD/Circumflex with angioplasty to first diagonal     History  Smoking status  . Former Smoker  . Types: Cigarettes  . Quit date: 10/19/2009  Smokeless tobacco  . Never Used    History  Alcohol Use No    Family History  Problem Relation Age of Onset  . Hypertension Father   . Heart disease Father   . Asthma Brother   . Stroke      Uncle  . Heart attack Father     Reviw of Systems:  Reviewed in the HPI.  All other systems are negative.  Physical Exam: Blood pressure 119/84, pulse 55, height 5\' 8"  (1.727 m), weight 190 lb 1.9 oz (86.238 kg). General: Well developed, well nourished, in no acute distress.  Head: Normocephalic, atraumatic, sclera non-icteric, mucus membranes are moist,   Neck: Supple. Carotids are 2 + without bruits. No JVD  Lungs: Clear bilaterally to auscultation.  Heart: regular rate.  normal  S1 S2. No murmurs, gallops or rubs.  Abdomen: Soft, non-tender,  non-distended with normal bowel sounds. No hepatomegaly. No rebound/guarding. No masses.  Msk:  Strength and tone are normal  Extremities: No clubbing or cyanosis. No edema.  Distal pedal pulses are 2+ and equal bilaterally.  Neuro: Alert and oriented X 3. Moves all extremities spontaneously.  Psych:  Responds to questions appropriately with a normal affect.  ECG: February 14, 2012- Sinus brady, Inferior MI.  Assessment / Plan:

## 2012-02-14 NOTE — Assessment & Plan Note (Signed)
His blood pressure and heart rate are well controlled. Continue with his current medications.

## 2012-02-14 NOTE — Patient Instructions (Addendum)
Your physician wants you to follow-up in:  6 months. You will receive a reminder letter in the mail two months in advance. If you don't receive a letter, please call our office to schedule the follow-up appointment.   

## 2012-07-18 ENCOUNTER — Encounter: Payer: Self-pay | Admitting: Nurse Practitioner

## 2012-07-24 ENCOUNTER — Other Ambulatory Visit: Payer: Self-pay | Admitting: Nurse Practitioner

## 2012-08-11 ENCOUNTER — Ambulatory Visit: Payer: Medicare Other | Admitting: Nurse Practitioner

## 2012-08-15 ENCOUNTER — Ambulatory Visit (INDEPENDENT_AMBULATORY_CARE_PROVIDER_SITE_OTHER): Payer: Medicare Other | Admitting: Nurse Practitioner

## 2012-08-15 ENCOUNTER — Encounter: Payer: Self-pay | Admitting: Nurse Practitioner

## 2012-08-15 VITALS — BP 110/68 | HR 54 | Resp 12 | Ht 68.0 in | Wt 191.8 lb

## 2012-08-15 DIAGNOSIS — I2510 Atherosclerotic heart disease of native coronary artery without angina pectoris: Secondary | ICD-10-CM

## 2012-08-15 DIAGNOSIS — Q308 Other congenital malformations of nose: Secondary | ICD-10-CM

## 2012-08-15 DIAGNOSIS — Q309 Congenital malformation of nose, unspecified: Secondary | ICD-10-CM

## 2012-08-15 DIAGNOSIS — I779 Disorder of arteries and arterioles, unspecified: Secondary | ICD-10-CM

## 2012-08-15 NOTE — Patient Instructions (Addendum)
We need to get your carotid dopplers updated  I will get you a visit with ENT about your nose  Stay on your current medicines  Stay active  Call the [facility] office at [phone] if you have any questions, problems or concerns.

## 2012-08-15 NOTE — Progress Notes (Signed)
Marvin Salinas Date of Birth: 05-07-1946 Medical Record #[id number]  History of Present Illness: Marvin Salinas is seen back today for a 6 month check. He is seen for Dr. Elease Hashimoto. He has known CAD with prior CABG back in 2011. Other issues include HTN, HLD, DM and extensive cerebral vascular disease with prior TIA's and known basilar artery stenosis. He is on chronic Plavix and aspirin therapy. Was on coumadin in the remote past. Last carotid doppler in 2013 showed 40 to 60% bilateral ICA stenoses.   Dr. Elease Hashimoto saw him 6 months ago. Was felt to be doing ok.   He comes in today. He is here alone. He is doing ok. No cardiac complaints. No chest pain. Not short of breath. Trying to walk some when the weather is good. Has had labs recently by Dr. Luciana Axe and those were reviewed. He is more upset about non healing sores in his nose. He wants to see someone. Sugars are ok. Tolerating his medicines. He does note that he got more stressed out over the holidays and smoked for a couple of weeks and then stopped. Currently not smoking.   Current Outpatient Prescriptions on File Prior to Visit  Medication Sig Dispense Refill  . aspirin 81 MG tablet Take 81 mg by mouth daily.        Marland Kitchen BENICAR HCT 20-12.5 MG per tablet TAKE 1 TABLET BY MOUTH EVERY DAY  90 tablet  3  . clopidogrel (PLAVIX) 75 MG tablet TAKE 1 TABLET BY MOUTH EVERY DAY  90 tablet  3  . Hypromellose 0.3 % SOLN 1 drop. Into each eye two times daily       . insulin glargine (LANTUS) 100 UNIT/ML injection Inject 25 Units into the skin 2 (two) times daily.       . metFORMIN (GLUCOPHAGE) 1000 MG tablet Take 1,000 mg by mouth 2 (two) times daily with a meal.        . metoprolol (LOPRESSOR) 50 MG tablet Take 75 mg by mouth 2 (two) times daily.       . nitroGLYCERIN (NITROSTAT) 0.4 MG SL tablet Place 1 tablet (0.4 mg total) under the tongue every 5 (five) minutes as needed.  25 tablet  11  . Omega-3 Fatty Acids (FISH OIL) 1000 MG CAPS Take 1,000 mg by mouth 2  (two) times daily.      . pravastatin (PRAVACHOL) 80 MG tablet TAKE 1 TABLET (80 MG TOTAL) BY MOUTH EVERY EVENING.  90 tablet  3  . STUDY MEDICATION Take 130 mg by mouth daily. Evacetrapib      . TRICOR 145 MG tablet TAKE 1 TABLET BY MOUTH EVERY DAY  90 tablet  1   No current facility-administered medications on file prior to visit.    Allergies  Allergen Reactions  . Codeine Nausea And Vomiting  . Latex Hives  . Lisinopril Cough    Past Medical History  Diagnosis Date  . Hypertension   . Hyperlipidemia   . Diabetes mellitus     on insulin  . Ischemic heart disease     prior PCI to RCA in 1989. S/P PCI to LAD and OM in 1992. S/P PCI to first DX in 2000. S/P CABG x 3 in May 2011  . Depression   . Sleep apnea   . Peripheral neuropathy   . History of renal calculi   . Back pain   . S/P CABG x 21 Oct 2009  . Cerebral vascular disease  with prior TIA's; followed by Dr. Sandria Manly  . Basilar artery stenosis     on chronic Plavix  . Pneumonia Sept 2012    Past Surgical History  Procedure Laterality Date  . Coronary artery bypass graft  11/12/2009    LIMA to LAD, SVG to OM and SVG to RCA  . Angioplasty  1989    right coronary artery  . Angioplasty  1992    LAD and OM  . Angioplasty  1998    First DX  . Coronary stent placement  2000    Stent to LAD/Circumflex with angioplasty to first diagonal     History  Smoking status  . Former Smoker  . Types: Cigarettes  . Quit date: 10/19/2009  Smokeless tobacco  . Never Used    History  Alcohol Use No    Family History  Problem Relation Age of Onset  . Hypertension Father   . Heart disease Father   . Asthma Brother   . Stroke      Uncle  . Heart attack Father     Review of Systems: The review of systems is per the HPI.  All other systems were reviewed and are negative.  Physical Exam: BP 110/68  Pulse 54  Resp 12  Ht 5\' 8"  (1.727 m)  Wt 191 lb 12 oz (86.977 kg)  BMI 29.16 kg/m2  SpO2 97% Patient is very  pleasant and in no acute distress. Skin is warm and dry. Color is normal.  HEENT is unremarkable. Normocephalic/atraumatic. PERRL. Sclera are nonicteric. Neck is supple. No masses. No JVD. Lungs are clear. Cardiac exam shows a regular rate and rhythm. Abdomen is soft. Extremities are without edema. Gait and ROM are intact. No gross neurologic deficits noted.  LABORATORY DATA: Reviewed from his PCP from January 2014  Lab Results  Component Value Date   WBC 12.9* 03/05/2011   HGB 14.4 03/05/2011   HCT 39.5 03/05/2011   PLT 239 03/05/2011   GLUCOSE 130* 03/15/2011   ALT 25 11/10/2009   AST 36 11/10/2009   NA 140 03/15/2011   K 4.6 03/15/2011   CL 103 03/15/2011   CREATININE 1.4 03/15/2011   BUN 22 03/15/2011   CO2 28 03/15/2011   INR 1.40 11/12/2009   HGBA1C  Value: 10.4 (NOTE)                                                                       According to the ADA Clinical Practice Recommendations for 2011, when HbA1c is used as a screening test:   >=6.5%   Diagnostic of Diabetes Mellitus           (if abnormal result  is confirmed)  5.7-6.4%   Increased risk of developing Diabetes Mellitus  References:Diagnosis and Classification of Diabetes Mellitus,Diabetes Care,2011,34(Suppl 1):S62-S69 and Standards of Medical Care in         Diabetes - 2011,Diabetes Care,2011,34  (Suppl 1):S11-S61.* 11/10/2009    Assessment / Plan:  1. CAD - no symptoms reported. Will continue with medical management.  Exercise is encouraged along with tobacco cessation.   2. PVD - needs his carotid study updated  3. HTN - BP looks ok.   4. HLD - has labs by his PCP  which are reviewed today.   5. DM - managed by his PCP  6. Nonhealing sores in his nose - will send to ENT per his request.   Patient is agreeable to this plan and will call if any problems develop in the interim.

## 2012-08-16 ENCOUNTER — Encounter (INDEPENDENT_AMBULATORY_CARE_PROVIDER_SITE_OTHER): Payer: Medicare Other

## 2012-08-16 DIAGNOSIS — I779 Disorder of arteries and arterioles, unspecified: Secondary | ICD-10-CM

## 2012-08-16 DIAGNOSIS — I6529 Occlusion and stenosis of unspecified carotid artery: Secondary | ICD-10-CM

## 2012-08-22 ENCOUNTER — Other Ambulatory Visit: Payer: Self-pay

## 2012-08-22 DIAGNOSIS — I6523 Occlusion and stenosis of bilateral carotid arteries: Secondary | ICD-10-CM

## 2012-10-19 ENCOUNTER — Encounter: Payer: Self-pay | Admitting: Neurology

## 2012-10-19 ENCOUNTER — Ambulatory Visit (INDEPENDENT_AMBULATORY_CARE_PROVIDER_SITE_OTHER): Payer: Self-pay | Admitting: Neurology

## 2012-10-19 VITALS — BP 122/63 | HR 51 | Wt 189.0 lb

## 2012-10-19 DIAGNOSIS — I679 Cerebrovascular disease, unspecified: Secondary | ICD-10-CM

## 2012-10-19 NOTE — Progress Notes (Signed)
PHYSICAL EXAMINATOINS:  Generalized: In no acute distress  Neck: Supple, no carotid bruits   Cardiac: Regular rate rhythm  Pulmonary: Clear to auscultation bilaterally  Musculoskeletal: No deformity  Neurological examination  Mentation: Alert oriented to time, place, history taking, and causual conversation  Cranial nerve II-XII: Pupils were equal round reactive to light extraocular movements were full, visual field were full on confrontational test. facial sensation and strength were normal. hearing was intact to finger rubbing bilaterally. Uvula tongue midline.  head turning and shoulder shrug and were normal and symmetric.Tongue protrusion into cheek strength was normal.  Motor: normal tone, bulk and strength.  Sensory: Intact to fine touch, pinprick, preserved vibratory sensation, and proprioception at toes.  Coordination: Normal finger to nose, heel-to-shin bilaterally there was no truncal ataxia  Gait: Rising up from seated position without assistance, normal stance, without trunk ataxia, moderate stride, good arm swing, smooth turning, able to perform tiptoe, and heel walking without difficulty.   Romberg signs: Negative  Deep tendon reflexes: Brachioradialis 2/2, biceps 2/2, triceps 2/2, patellar 2/2, Achilles 2/2, plantar responses were flexor bilaterally.

## 2012-10-19 NOTE — Progress Notes (Addendum)
Patient is here for Visit 7 in the Accelerate Study.  Pt. completed the health questionnaire per protocol. Conmeds and AEs were reviewed.  Pt. reported that he stopped taking Flomax 10/03/12 due to right breast swelling. No other changes in conmeds.  Vital signs were collected per protocol.  Drug accountability was done.  Kit [id number] (3 bottles, 19 tablets) were returned.  Pt. reports that he missed 4 days in a row of his study drug due to his Right breast swelling.  He wasn't sure if it was from the study drug or not.  His urologist says it was from his Flomax so he stopped it.  Study drug was not taken on 10/03/12-10/06/12.  I reminded him of the importance of taking the study drug daily.   Dr. Terrace Arabia performed PE per protocol. Study kits [id number] and [id number] were dispensed.  (6 bottles, 210 tablets)  Reminded pt. to return study drug at next visit.

## 2012-10-19 NOTE — Patient Instructions (Signed)
Instructed pt. to continue to take study drug daily and return it at his next study visit.

## 2012-10-25 ENCOUNTER — Other Ambulatory Visit: Payer: Self-pay | Admitting: *Deleted

## 2012-11-11 ENCOUNTER — Other Ambulatory Visit: Payer: Self-pay | Admitting: Nurse Practitioner

## 2012-12-20 ENCOUNTER — Other Ambulatory Visit: Payer: Self-pay | Admitting: Nurse Practitioner

## 2012-12-23 ENCOUNTER — Other Ambulatory Visit: Payer: Self-pay | Admitting: Nurse Practitioner

## 2013-01-17 ENCOUNTER — Ambulatory Visit: Payer: Self-pay | Admitting: Neurology

## 2013-02-09 ENCOUNTER — Encounter: Payer: Self-pay | Admitting: Nurse Practitioner

## 2013-02-09 ENCOUNTER — Ambulatory Visit (INDEPENDENT_AMBULATORY_CARE_PROVIDER_SITE_OTHER): Payer: Medicare Other | Admitting: Nurse Practitioner

## 2013-02-09 VITALS — BP 120/80 | HR 52 | Ht 68.0 in | Wt 189.4 lb

## 2013-02-09 DIAGNOSIS — I2510 Atherosclerotic heart disease of native coronary artery without angina pectoris: Secondary | ICD-10-CM

## 2013-02-09 NOTE — Patient Instructions (Addendum)
We will get you a visit with the dermatologist  I will see you in 6 months  Stay active  Stay on your current medicines - let Dr. Barbaraann Barthel know if you cut your Pravachol in half.   I will send her a note about you today.  Call the [facility] office at [phone] if you have any questions, problems or concerns.

## 2013-02-09 NOTE — Progress Notes (Signed)
[name] Date of Birth: [birth year] Medical Record #[id number]  History of Present Illness: Marvin Salinas is seen back today for a 6 month check. Seen for Dr. Elease Salinas. Former patient of Dr. Ronnald Salinas. He has known CAD with prior CABG back in 2011. Other issues include HTN, HLD, DM and extensive cerebral vascular disease with prior TIAs and known basilar artery stenosis. He is on chronic Plavix and aspirin therapy. Was on coumadin in the remote past. Last carotid doppler in February 2014 showed 40 to 60% bilateral ICA stenoses with a moderate left subclavian stenosis. He has been a prior smoker.   Last seen back in February and was doing ok from our standpoint. His labs are checked by his PCP.   Comes back today. Here alone. Doing well. No chest pain. Not short of breath. Sugars running in the 120's. Not dizzy or lightheaded. Does have some diffuse myalgias and thinks it is from the statin - he is considering cutting his dose back - due for labs in December with his PCP. Overall, doing ok. Says he is not smoking but does smell of tobacco today (says it is his girlfriend). His biggest concern is some skin lesions on his head - really all over his body - has never seen a dermatologist - he has had lots of sun exposure.   Current Outpatient Prescriptions  Medication Sig Dispense Refill  . aspirin 81 MG tablet Take 81 mg by mouth daily.        Marland Kitchen BENICAR HCT 20-12.5 MG per tablet TAKE 1 TABLET BY MOUTH EVERY DAY  90 tablet  1  . clopidogrel (PLAVIX) 75 MG tablet TAKE 1 TABLET BY MOUTH EVERY DAY  90 tablet  1  . Hypromellose 0.3 % SOLN 1 drop. Into each eye two times daily       . insulin glargine (LANTUS) 100 UNIT/ML injection Inject 25 Units into the skin 2 (two) times daily. 25 units am and 30 units pm      . metFORMIN (GLUCOPHAGE) 1000 MG tablet Take 1,000 mg by mouth 2 (two) times daily with a meal.        . metoprolol (LOPRESSOR) 50 MG tablet TAKE 1 AND 1/2 TABLETS BY MOUTH TWICE DAILY  270 tablet  1    . nitroGLYCERIN (NITROSTAT) 0.4 MG SL tablet Place 1 tablet (0.4 mg total) under the tongue every 5 (five) minutes as needed.  25 tablet  11  . Omega-3 Fatty Acids (FISH OIL) 1000 MG CAPS Take 1,000 mg by mouth 2 (two) times daily.      . pravastatin (PRAVACHOL) 80 MG tablet TAKE 1 TABLET (80 MG TOTAL) BY MOUTH EVERY EVENING.  90 tablet  3  . STUDY MEDICATION Take 130 mg by mouth daily. Evacetrapib      . TRICOR 145 MG tablet TAKE 1 TABLET BY MOUTH EVERY DAY  90 tablet  1   No current facility-administered medications for this visit.    Allergies  Allergen Reactions  . Codeine Nausea And Vomiting  . Latex Hives  . Lisinopril Cough    Past Medical History  Diagnosis Date  . Hypertension   . Hyperlipidemia   . Diabetes mellitus     on insulin  . Ischemic heart disease     prior PCI to RCA in 1989. S/P PCI to LAD and OM in 1992. S/P PCI to first DX in 2000. S/P CABG x 3 in May 2011  . Depression   . Sleep apnea   .  Peripheral neuropathy   . History of renal calculi   . Back pain   . S/P CABG x 21 Oct 2009  . Cerebral vascular disease     with prior TIA's; followed by Dr. Sandria Salinas  . Basilar artery stenosis     on chronic Plavix  . Pneumonia Sept 2012    Past Surgical History  Procedure Laterality Date  . Coronary artery bypass graft  11/12/2009    LIMA to LAD, SVG to OM and SVG to RCA  . Angioplasty  1989    right coronary artery  . Angioplasty  1992    LAD and OM  . Angioplasty  1998    First DX  . Coronary stent placement  2000    Stent to LAD/Circumflex with angioplasty to first diagonal     History  Smoking status  . Former Smoker  . Types: Cigarettes  . Quit date: 10/19/2009  Smokeless tobacco  . Never Used    History  Alcohol Use No    Family History  Problem Relation Age of Onset  . Hypertension Father   . Heart disease Father   . Asthma Brother   . Stroke      Uncle  . Heart attack Father     Review of Systems: The review of systems is per  the HPI.  All other systems were reviewed and are negative.  Physical Exam: BP 120/80  Pulse 52  Ht 5\' 8"  (1.727 m)  Wt 189 lb 6.4 oz (85.911 kg)  BMI 28.8 kg/m2 Patient is very pleasant and in no acute distress. Weight down 2 pounds over 6 months. Skin is warm and dry. Color is normal.  HEENT is unremarkable. Normocephalic/atraumatic. PERRL. Sclera are nonicteric. Neck is supple. No masses. No JVD. Lungs are clear. Cardiac exam shows a regular rate and rhythm. Abdomen is soft. Extremities are without edema. Gait and ROM are intact. No gross neurologic deficits noted.  LABORATORY DATA: Lab Results  Component Value Date   WBC 12.9* 03/05/2011   HGB 14.4 03/05/2011   HCT 39.5 03/05/2011   PLT 239 03/05/2011   GLUCOSE 130* 03/15/2011   ALT 25 11/10/2009   AST 36 11/10/2009   NA 140 03/15/2011   K 4.6 03/15/2011   CL 103 03/15/2011   CREATININE 1.4 03/15/2011   BUN 22 03/15/2011   CO2 28 03/15/2011   INR 1.40 11/12/2009   HGBA1C  Value: 10.4 (NOTE)                                                                       According to the ADA Clinical Practice Recommendations for 2011, when HbA1c is used as a screening test:   >=6.5%   Diagnostic of Diabetes Mellitus           (if abnormal result  is confirmed)  5.7-6.4%   Increased risk of developing Diabetes Mellitus  References:Diagnosis and Classification of Diabetes Mellitus,Diabetes Care,2011,34(Suppl 1):S62-S69 and Standards of Medical Care in         Diabetes - 2011,Diabetes Care,2011,34  (Suppl 1):S11-S61.* 11/10/2009     Assessment / Plan:  1. CAD - stable with no symptoms reported.   2. HTN - BP looks great. No change in current  therapy.   3. HLD - on statin and Tricor - some myalgias - he is thinking about cutting his dose of statin in half - he will alert his PCP at his upcoming visit.   4. Tobacco abuse - not smoking per his report.   Patient is agreeable to this plan and will call if any problems develop in the interim.   Rosalio Macadamia, RN, ANP-C [facility] [address] [place], [place]  [address]

## 2013-03-23 ENCOUNTER — Encounter (INDEPENDENT_AMBULATORY_CARE_PROVIDER_SITE_OTHER): Payer: Self-pay

## 2013-03-23 DIAGNOSIS — Z0289 Encounter for other administrative examinations: Secondary | ICD-10-CM

## 2013-04-18 ENCOUNTER — Encounter (INDEPENDENT_AMBULATORY_CARE_PROVIDER_SITE_OTHER): Payer: Self-pay

## 2013-04-18 ENCOUNTER — Encounter: Payer: Self-pay | Admitting: Neurology

## 2013-04-18 ENCOUNTER — Ambulatory Visit (INDEPENDENT_AMBULATORY_CARE_PROVIDER_SITE_OTHER): Payer: Medicare Other | Admitting: Neurology

## 2013-04-18 DIAGNOSIS — I6509 Occlusion and stenosis of unspecified vertebral artery: Secondary | ICD-10-CM

## 2013-04-18 NOTE — Progress Notes (Signed)
[facility] [address] [place]. [place] [address] [phone]       OFFICE FOLLOW-UP NOTE  Mr. ARISTON GRANDISON Date of Birth:  [birth year] Medical Record Number:  [id number]   HPI:  35 year patient male with a remote history of small left cerebellar stroke and moderate extracranial carotid stenosis and severe proximal basilar and distal right vertebral artery stenosis with failed attempt at angioplasty stenting in the past.  Update 04/18/13 He is seen today for followup for transfer of neurological care after last visit with Dr. love on 07/20/12. He has history of a small right cerebellar infarct on 04/16/2001 at that time he was found to have proximal left vertebral artery, proximal left common carotid artery and both external carotid artery stenosis as well as intracranial stenosis involving mid basilar artery. Catheter angiogram confirmed basilar stenosis and angioplasty stenting was attempted but was unsuccessful. He was subsequently treated with Coumadin for a while. Followup studies in 2003 confirmed basilar artery as well as distal right and left vertebral artery stenosis. He had had 2 transient episodes of dizziness in February 2013 which were questionable TIA versus vestibular in origin. He had done well since then and has not had a recurrent strokes or TIAs. He has had followup serial carotid ultrasounds done at about cardiology and has found to be stable 40-50% ICA stenosis. His last MRA study was in March May 2013 and which showed high-grade stenosis of basilar artery, multifocal narrowing of the right vertebral artery and high-grade stenosis of the left vertebrobasilar junction. He states his cholesterol has been under good control as well as his blood pressure has been quite good. He has no symptoms today. He remains on aspirin and Plavix and is tolerating it well with only minor bruising. He states his average blood glucose has been 114 with today's fasting being 93 mg  percent. His last lipid profile when checked earlier this year was fine. He is also participating in the ACCELERATE trial and last saw Dr. Terrace Arabia in May 2014. ROS:   14 system review of systems is positive for cough, wheezing, joint pain, numbness, snoring, depression, allergies, runny nose, urination problems. PMH:  Past Medical History  Diagnosis Date  . Hypertension   . Hyperlipidemia   . Diabetes mellitus     on insulin  . Ischemic heart disease     prior PCI to RCA in 1989. S/P PCI to LAD and OM in 1992. S/P PCI to first DX in 2000. S/P CABG x 3 in May 2011  . Depression   . Sleep apnea   . Peripheral neuropathy   . History of renal calculi   . Back pain   . S/P CABG x 21 Oct 2009  . Cerebral vascular disease     with prior TIA's; followed by Dr. Sandria Manly  . Basilar artery stenosis     on chronic Plavix  . Pneumonia Sept 2012    Social History:  History   Social History  . Marital Status: Divorced    Spouse Name: N/A    Number of Children: 0  . Years of Education: GED   Occupational History  . retired    Social History Main Topics  . Smoking status: Current Some Day Smoker    Types: Cigarettes    Last Attempt to Quit: 10/19/2009  . Smokeless tobacco: Never Used  . Alcohol Use: No  . Drug Use: No  . Sexual Activity: No   Other Topics Concern  .  Not on file   Social History Narrative  . No narrative on file    Medications:   Current Outpatient Prescriptions on File Prior to Visit  Medication Sig Dispense Refill  . aspirin 81 MG tablet Take 81 mg by mouth daily.        Marland Kitchen BENICAR HCT 20-12.5 MG per tablet TAKE 1 TABLET BY MOUTH EVERY DAY  90 tablet  1  . clopidogrel (PLAVIX) 75 MG tablet TAKE 1 TABLET BY MOUTH EVERY DAY  90 tablet  1  . Hypromellose 0.3 % SOLN 1 drop. Into each eye two times daily       . insulin glargine (LANTUS) 100 UNIT/ML injection Inject 25 Units into the skin 2 (two) times daily. 25 units am and 30 units pm      . metFORMIN (GLUCOPHAGE)  1000 MG tablet Take 1,000 mg by mouth 2 (two) times daily with a meal.        . metoprolol (LOPRESSOR) 50 MG tablet TAKE 1 AND 1/2 TABLETS BY MOUTH TWICE DAILY  270 tablet  1  . nitroGLYCERIN (NITROSTAT) 0.4 MG SL tablet Place 1 tablet (0.4 mg total) under the tongue every 5 (five) minutes as needed.  25 tablet  11  . Omega-3 Fatty Acids (FISH OIL) 1000 MG CAPS Take 1,000 mg by mouth 2 (two) times daily.      . pravastatin (PRAVACHOL) 80 MG tablet TAKE 1 TABLET (80 MG TOTAL) BY MOUTH EVERY EVENING.  90 tablet  3  . STUDY MEDICATION Take 130 mg by mouth daily. Evacetrapib      . TRICOR 145 MG tablet TAKE 1 TABLET BY MOUTH EVERY DAY  90 tablet  1   No current facility-administered medications on file prior to visit.    Allergies:   Allergies  Allergen Reactions  . Codeine Nausea And Vomiting  . Latex Hives  . Lisinopril Cough    Physical Exam General: well developed, well nourished, seated, in no evident distress Head: head normocephalic and atraumatic. Orohparynx benign Neck: supple with no carotid or supraclavicular bruits Cardiovascular: regular rate and rhythm, no murmurs Musculoskeletal: no deformity Skin:  no rash/petichiae Vascular:  Normal pulses all extremities Filed Vitals:   04/18/13 1435  BP: 128/65  Pulse: 57  Temp: 97.8 F (36.6 C)     Neurologic Exam Mental Status: Awake and fully alert. Oriented to place and time. Recent and remote memory intact. Attention span, concentration and fund of knowledge appropriate. Mood and affect appropriate.  Cranial Nerves: Fundoscopic exam reveals sharp disc margins. Pupils equal, briskly reactive to light. Extraocular movements full without nystagmus. Visual fields full to confrontation. Hearing intact. Facial sensation intact. Face, tongue, palate moves normally and symmetrically.  Motor: Normal bulk and tone. Normal strength in all tested extremity muscles. Sensory.: diminished touch and pinprick and vibratory sensation in  stocking distribution bilaterally.  Coordination: Rapid alternating movements normal in all extremities. Finger-to-nose and heel-to-shin performed accurately bilaterally. Gait and Station: Arises from chair without difficulty. Stance is normal. Gait demonstrates normal stride length and balance . Not able to heel, toe and tandem walk without difficulty.  Reflexes: 1+ and symmetric. Toes downgoing.    ASSESSMENT: 26 year patient male with a remote history of small left cerebellar stroke and moderate extracranial carotid stenosis and severe proximal basilar and distal right vertebral artery stenosis with failed attempt at angioplasty stenting in the past. He is doing well from a neurovascular standpoint without any recurrent symptom for several years. Is also  participating in the ACCELERATE trial    PLAN: He was advised to continue aspirin and Plavix given his significant cardiac disease. Maintain strict control of diabetes with hemoglobin A1c goal below 6.5%, hypertension but blood pressure goal below 120/80 and lipids with LDL cholesterol goal below 70 mg percent. Check Transcranial Doppler studies. Check yearly carotid Dopplers. Return for followup in one year with Heide Guile, NP. call earlier if necessary.

## 2013-04-18 NOTE — Patient Instructions (Signed)
He was advised to continue aspirin and Plavix given his significant cardiac disease. Maintain strict control of diabetes with hemoglobin A1c goal below 6.5%, hypertension but blood pressure goal below 120/80 and lipids with LDL cholesterol goal below 70 mg percent. Check Transcranial Doppler studies. Check yearly carotid Dopplers. Return for followup in one year with Heide Guile, NP. call earlier if necessary.

## 2013-05-04 ENCOUNTER — Ambulatory Visit (INDEPENDENT_AMBULATORY_CARE_PROVIDER_SITE_OTHER): Payer: Medicare Other

## 2013-05-04 DIAGNOSIS — I6509 Occlusion and stenosis of unspecified vertebral artery: Secondary | ICD-10-CM

## 2013-05-10 ENCOUNTER — Other Ambulatory Visit: Payer: Self-pay | Admitting: Nurse Practitioner

## 2013-07-07 ENCOUNTER — Other Ambulatory Visit: Payer: Self-pay | Admitting: Cardiovascular Disease

## 2013-08-13 ENCOUNTER — Ambulatory Visit: Payer: Medicare Other | Admitting: Nurse Practitioner

## 2013-08-21 ENCOUNTER — Ambulatory Visit (INDEPENDENT_AMBULATORY_CARE_PROVIDER_SITE_OTHER): Payer: Medicare Other | Admitting: Nurse Practitioner

## 2013-08-21 ENCOUNTER — Encounter: Payer: Self-pay | Admitting: Nurse Practitioner

## 2013-08-21 ENCOUNTER — Other Ambulatory Visit: Payer: Self-pay | Admitting: *Deleted

## 2013-08-21 VITALS — BP 110/66 | HR 56 | Ht 68.0 in | Wt 191.1 lb

## 2013-08-21 DIAGNOSIS — I10 Essential (primary) hypertension: Secondary | ICD-10-CM

## 2013-08-21 DIAGNOSIS — I2510 Atherosclerotic heart disease of native coronary artery without angina pectoris: Secondary | ICD-10-CM

## 2013-08-21 DIAGNOSIS — I6529 Occlusion and stenosis of unspecified carotid artery: Secondary | ICD-10-CM

## 2013-08-21 DIAGNOSIS — I6523 Occlusion and stenosis of bilateral carotid arteries: Secondary | ICD-10-CM

## 2013-08-21 DIAGNOSIS — I658 Occlusion and stenosis of other precerebral arteries: Secondary | ICD-10-CM

## 2013-08-21 MED ORDER — METOPROLOL TARTRATE 50 MG PO TABS: ORAL_TABLET | ORAL | Status: DC

## 2013-08-21 MED ORDER — NITROGLYCERIN 0.4 MG SL SUBL: 0.4000 mg | SUBLINGUAL_TABLET | SUBLINGUAL | Status: DC | PRN

## 2013-08-21 NOTE — Progress Notes (Signed)
[name] Date of Birth: 1945/12/13 Medical Record #[id number]  History of Present Illness: Marvin Salinas is seen back today for a 6 month check. Seen for Dr. Acie Fredrickson. Former patient of Dr. Susa Simmonds. He has known CAD with prior CABG back in 2011. Other issues include HTN, HLD, DM and extensive cerebral vascular disease with prior TIAs, remote history of small left cerebellar stroke and moderate extracranial carotid stenosis and severe proximal basilar and distal right vertebral artery stenosis with failed attempt at angioplasty stenting in the past.  He is on chronic Plavix and aspirin therapy. Was on coumadin in the remote past. Last carotid doppler in February 2014 showed 40 to 60% bilateral ICA stenoses with a moderate left subclavian stenosis. He has been a prior smoker.   Last seen back in August and was doing ok from our standpoint. His labs are checked by his PCP. We did refer him to dermatology for some skin lesions.  Comes back today. Here alone. Doing ok. Saw neurology back in October. No chest pain. Not short of breath. Denies smoking. Tolerating his medicines. Labs checked by PCP. Really has no complaint. Says his sugar is doing well. Needs some of his medicines refilled. Occasionally will get dizzy if he stands up to fast - but otherwise not passing out.   Current Outpatient Prescriptions  Medication Sig Dispense Refill  . aspirin 81 MG tablet Take 81 mg by mouth daily.        Marland Kitchen BENICAR HCT 20-12.5 MG per tablet TAKE 1 TABLET BY MOUTH EVERY DAY  90 tablet  1  . clopidogrel (PLAVIX) 75 MG tablet TAKE 1 TABLET BY MOUTH EVERY DAY  90 tablet  1  . fluticasone (FLONASE) 50 MCG/ACT nasal spray Place 1 spray into both nostrils 2 (two) times daily.      . Glucose Blood (FREESTYLE LITE TEST VI)       . Hypertonic Nasal Wash (SINUS RINSE BOTTLE KIT NA) Place into the nose 2 (two) times daily.      . Hypromellose 0.3 % SOLN 1 drop. Into each eye two times daily       . insulin glargine (LANTUS)  100 UNIT/ML injection Inject 30 Units into the skin 2 (two) times daily. 30 units am and 30 units pm      . loratadine (CLARITIN) 10 MG tablet Take 10 mg by mouth daily.      . metFORMIN (GLUCOPHAGE) 1000 MG tablet Take 1,000 mg by mouth 2 (two) times daily with a meal.        . metoprolol (LOPRESSOR) 50 MG tablet TAKE 1 AND 1/2 TABLETS BY MOUTH TWICE DAILY  270 tablet  0  . nitroGLYCERIN (NITROSTAT) 0.4 MG SL tablet Place 1 tablet (0.4 mg total) under the tongue every 5 (five) minutes as needed.  25 tablet  11  . Omega-3 Fatty Acids (FISH OIL) 1000 MG CAPS Take 1,000 mg by mouth 2 (two) times daily.      . pravastatin (PRAVACHOL) 80 MG tablet TAKE 1 TABLET (80 MG TOTAL) BY MOUTH EVERY EVENING.  90 tablet  3  . STUDY MEDICATION Take 130 mg by mouth daily. Evacetrapib      . TRICOR 145 MG tablet TAKE 1 TABLET BY MOUTH EVERY DAY  90 tablet  1   No current facility-administered medications for this visit.    Allergies  Allergen Reactions  . Codeine Nausea And Vomiting  . Latex Hives  . Lisinopril Cough    Past Medical  History  Diagnosis Date  . Hypertension   . Hyperlipidemia   . Diabetes mellitus     on insulin  . Ischemic heart disease     prior PCI to RCA in 1989. S/P PCI to LAD and OM in 1992. S/P PCI to first DX in 2000. S/P CABG x 3 in May 2011  . Depression   . Sleep apnea   . Peripheral neuropathy   . History of renal calculi   . Back pain   . S/P CABG x 21 Oct 2009  . Cerebral vascular disease     with prior TIA's; followed by Dr. Erling Cruz  . Basilar artery stenosis     on chronic Plavix  . Pneumonia Sept 2012    Past Surgical History  Procedure Laterality Date  . Coronary artery bypass graft  11/12/2009    LIMA to LAD, SVG to OM and SVG to RCA  . Angioplasty  1989    right coronary artery  . Angioplasty  1992    LAD and OM  . Angioplasty  1998    First DX  . Coronary stent placement  2000    Stent to LAD/Circumflex with angioplasty to first diagonal      History  Smoking status  . Current Some Day Smoker  . Types: Cigarettes  . Last Attempt to Quit: 10/19/2009  Smokeless tobacco  . Never Used    History  Alcohol Use No    Family History  Problem Relation Age of Onset  . Hypertension Father   . Heart disease Father   . Asthma Brother   . Stroke      Uncle  . Heart attack Father     Review of Systems: The review of systems is per the HPI.  All other systems were reviewed and are negative.  Physical Exam: BP 110/66  Pulse 56  Ht '5\' 8"'  (1.727 m)  Wt 191 lb 1.9 oz (86.691 kg)  BMI 29.07 kg/m2 Patient is very pleasant and in no acute distress. Does smell of tobacco. Skin is warm and dry. Color is normal.  HEENT is unremarkable. Normocephalic/atraumatic. PERRL. Sclera are nonicteric. Neck is supple. No masses. No JVD. Lungs are clear. Cardiac exam shows a regular rate and rhythm. Abdomen is soft. Extremities are without edema. Gait and ROM are intact. No gross neurologic deficits noted.  Wt Readings from Last 3 Encounters:  08/21/13 191 lb 1.9 oz (86.691 kg)  04/18/13 189 lb (85.73 kg)  02/09/13 189 lb 6.4 oz (85.911 kg)     LABORATORY DATA: Lab Results  Component Value Date   WBC 12.9* 03/05/2011   HGB 14.4 03/05/2011   HCT 39.5 03/05/2011   PLT 239 03/05/2011   GLUCOSE 130* 03/15/2011   ALT 25 11/10/2009   AST 36 11/10/2009   NA 140 03/15/2011   K 4.6 03/15/2011   CL 103 03/15/2011   CREATININE 1.4 03/15/2011   BUN 22 03/15/2011   CO2 28 03/15/2011   INR 1.40 11/12/2009   HGBA1C  Value: 10.4 (NOTE)                                                                       According to the ADA Clinical Practice Recommendations for  2011, when HbA1c is used as a screening test:   >=6.5%   Diagnostic of Diabetes Mellitus           (if abnormal result  is confirmed)  5.7-6.4%   Increased risk of developing Diabetes Mellitus  References:Diagnosis and Classification of Diabetes Mellitus,Diabetes CSPZ,9802,21(TVGVS 1):S62-S69 and  Standards of Medical Care in         Diabetes - 2011,Diabetes YVGC,6282,41  (Suppl 1):S11-S61.* 11/10/2009     Assessment / Plan:  1. CAD - stable with no symptoms reported. Managed medically.   2. HTN - BP looks great. No change in current therapy.   3. HLD - remains on lipid lowering - labs from December 2014 looked good  4. Tobacco abuse - not smoking per his report.   5. Extensive cerebrovascular disease - known carotid disease - needs duplex updated - continue ASA & Plavix.  NTG and metoprolol refilled today. I will see him in 6 months. Continue with CV risk factor modification.   Patient is agreeable to this plan and will call if any problems develop in the interim.   Burtis Junes, RN, [facility] [address] [place],   [address] [phone]

## 2013-08-21 NOTE — Patient Instructions (Addendum)
Stay on your current medicines  I have refilled your NTG today and your Metoprolol today  We need to update your carotid dopplers  I will see you in 6 months  Ask Dr. Radene Ou to keep sending [place] copies of your labs  Call the [place] office at [phone] if you have any questions, problems or concerns.

## 2013-09-20 ENCOUNTER — Encounter (INDEPENDENT_AMBULATORY_CARE_PROVIDER_SITE_OTHER): Payer: Self-pay

## 2013-09-20 DIAGNOSIS — Z0289 Encounter for other administrative examinations: Secondary | ICD-10-CM

## 2013-09-21 ENCOUNTER — Ambulatory Visit (HOSPITAL_COMMUNITY): Payer: Medicare Other | Attending: Cardiovascular Disease | Admitting: Cardiology

## 2013-09-21 ENCOUNTER — Encounter: Payer: Self-pay | Admitting: Cardiovascular Disease

## 2013-09-21 DIAGNOSIS — I679 Cerebrovascular disease, unspecified: Secondary | ICD-10-CM

## 2013-09-21 DIAGNOSIS — I6529 Occlusion and stenosis of unspecified carotid artery: Secondary | ICD-10-CM

## 2013-09-21 NOTE — Progress Notes (Signed)
Carotid duplex performed 

## 2013-09-24 ENCOUNTER — Other Ambulatory Visit: Payer: Self-pay

## 2013-09-26 ENCOUNTER — Telehealth: Payer: Self-pay | Admitting: *Deleted

## 2013-09-26 NOTE — Telephone Encounter (Signed)
Patient was called and a message was left to contact Neurology office to discuss abnormal lab results.

## 2013-11-05 ENCOUNTER — Other Ambulatory Visit: Payer: Self-pay | Admitting: Nurse Practitioner

## 2013-11-21 DIAGNOSIS — F341 Dysthymic disorder: Secondary | ICD-10-CM | POA: Insufficient documentation

## 2013-11-21 HISTORY — DX: Dysthymic disorder: F34.1

## 2013-12-06 ENCOUNTER — Encounter: Payer: Self-pay | Admitting: Pulmonary Disease

## 2013-12-06 ENCOUNTER — Encounter (INDEPENDENT_AMBULATORY_CARE_PROVIDER_SITE_OTHER): Payer: Self-pay

## 2013-12-06 ENCOUNTER — Ambulatory Visit (INDEPENDENT_AMBULATORY_CARE_PROVIDER_SITE_OTHER): Payer: Medicare Other | Admitting: Pulmonary Disease

## 2013-12-06 VITALS — BP 132/70 | HR 60 | Ht 68.0 in | Wt 193.0 lb

## 2013-12-06 DIAGNOSIS — J309 Allergic rhinitis, unspecified: Secondary | ICD-10-CM | POA: Insufficient documentation

## 2013-12-06 DIAGNOSIS — F17200 Nicotine dependence, unspecified, uncomplicated: Secondary | ICD-10-CM | POA: Insufficient documentation

## 2013-12-06 DIAGNOSIS — R059 Cough, unspecified: Secondary | ICD-10-CM | POA: Insufficient documentation

## 2013-12-06 DIAGNOSIS — R05 Cough: Secondary | ICD-10-CM

## 2013-12-06 MED ORDER — BENZONATATE 100 MG PO CAPS: 100.0000 mg | ORAL_CAPSULE | Freq: Four times a day (QID) | ORAL | Status: DC | PRN

## 2013-12-06 NOTE — Assessment & Plan Note (Signed)
As noted above, I believe this is the primary reason for his cough.  Plan: -Saline rinses, antihistamine/decongestant combination, nasal steroid

## 2013-12-06 NOTE — Patient Instructions (Signed)
We will request the results of the Chest X-ray from Dr. Dahlia Bailiff office You need to try to suppress your cough to allow your larynx (voice box) to heal.  For three days don't talk, laugh, sing, or clear your throat. Do everything you can to suppress the cough during this time. Use hard candies (sugarless Jolly Ranchers) or non-mint or non-menthol containing cough drops during this time to soothe your throat.  Use a cough suppressant (tessalon) around the clock during this time.  After three days, gradually increase the use of your voice and back off on the cough suppressants. We recommend that you use the Milta Deiters Med rinse twice a day as needed for your nasal congestion.  Make sure that you use distilled water for this. Take the nasal spray from your primary care physician regularly Take combination tablets of chlorpheniramine-phenylephrine at bedtime and three times a day as needed for sinus drainage If no improvement in 3 weeks on the regimen above, start taking prilosec daily  We will see you back in 4-6 weeks or sooner if needed

## 2013-12-06 NOTE — Progress Notes (Signed)
Subjective:    Patient ID: Marvin Salinas, male    DOB: [birth year], 68 y.o.   MRN: [id number]  HPI  Marvin Salinas is a 67 year old male who smokes cigarettes has come to see me today for ongoing cough. He has never been told in the past that he has lung problems but he has had 2 episodes of pneumonia. One episode requiring hospitalization in 2010 or 2011. After that he had resolution of cough and no shortness of breath. In November 2014 he went to his primary care physician with a respiratory infection and was told that he had pneumonia. At that time he had a cough productive of brown sputum. Since then he has had a cough which has persisted ever since. This is associated with laying down flat. However, other than laughing, he cannot think of anything else that brings on the cough. It is not worse with talking, eating, or exertion. He has not had shortness of breath. He has not had chest pain. He has not had weight loss. He does not have hemoptysis.  He still currently smokes one to 2 cigarettes a day. He says that some days he will go without cigarettes altogether. He used to smoke as much as half a pack of cigarette a day and actually managed to quit for 7 years. However for the last several years he smokes one to 2 cigarettes a day.  He denies heartburn.  He says that he has significant sinus symptoms in that nearly every day he has sinus congestion and postnasal drip. He takes loratadine on days when it's bad and he says this will help some. He has previously used nasal steroids and its not clear if he feels like they have helped. However, it doesn't sound like he's used them appropriately. He occasionally uses saline rinses when his sinus symptoms are bad.  Past Medical History  Diagnosis Date  . Hypertension   . Hyperlipidemia   . Diabetes mellitus     on insulin  . Ischemic heart disease     prior PCI to RCA in 1989. S/P PCI to LAD and OM in 1992. S/P PCI to first DX in 2000. S/P CABG x 3  in May 2011  . Depression   . Sleep apnea   . Peripheral neuropathy   . History of renal calculi   . Back pain   . S/P CABG x 21 Oct 2009  . Cerebral vascular disease     with prior TIA's; followed by Dr. Erling Cruz  . Basilar artery stenosis     on chronic Plavix  . Pneumonia Sept 2012     Family History  Problem Relation Age of Onset  . Hypertension Father   . Heart disease Father   . Asthma Brother   . Stroke      Uncle  . Heart attack Father      History   Social History  . Marital Status: Divorced    Spouse Name: N/A    Number of Children: 0  . Years of Education: GED   Occupational History  . retired    Social History Main Topics  . Smoking status: Current Some Day Smoker -- 1.00 packs/day for 54 years    Types: Cigarettes    Last Attempt to Quit: 10/19/2009  . Smokeless tobacco: Never Used     Comment: Down to 2 cigs some days 12/06/13  . Alcohol Use: No  . Drug Use: No  . Sexual Activity: No  Other Topics Concern  . Not on file   Social History Narrative  . No narrative on file     Allergies  Allergen Reactions  . Codeine Nausea And Vomiting  . Latex Hives  . Lisinopril Cough     Outpatient Prescriptions Prior to Visit  Medication Sig Dispense Refill  . aspirin 81 MG tablet Take 81 mg by mouth daily.        Marland Kitchen BENICAR HCT 20-12.5 MG per tablet TAKE 1 TABLET BY MOUTH EVERY DAY  90 tablet  1  . clopidogrel (PLAVIX) 75 MG tablet TAKE 1 TABLET BY MOUTH EVERY DAY  90 tablet  1  . Glucose Blood (FREESTYLE LITE TEST VI)       . Hypertonic Nasal Wash (SINUS RINSE BOTTLE KIT NA) Place into the nose 2 (two) times daily.      . Hypromellose 0.3 % SOLN 1 drop. Into each eye two times daily       . insulin glargine (LANTUS) 100 UNIT/ML injection Inject 30 Units into the skin 2 (two) times daily. 30 units am and 30 units pm      . loratadine (CLARITIN) 10 MG tablet Take 10 mg by mouth daily.      . metFORMIN (GLUCOPHAGE) 1000 MG tablet Take 1,000 mg by mouth 2  (two) times daily with a meal.        . metoprolol (LOPRESSOR) 50 MG tablet TAKE 1 AND 1/2 TABLETS BY MOUTH TWICE DAILY  270 tablet  3  . nitroGLYCERIN (NITROSTAT) 0.4 MG SL tablet Place 1 tablet (0.4 mg total) under the tongue every 5 (five) minutes as needed.  25 tablet  11  . Omega-3 Fatty Acids (FISH OIL) 1000 MG CAPS Take 1,000 mg by mouth 2 (two) times daily.      . pravastatin (PRAVACHOL) 80 MG tablet TAKE 1 TABLET (80 MG TOTAL) BY MOUTH EVERY EVENING.  90 tablet  3  . STUDY MEDICATION Take 130 mg by mouth daily. Evacetrapib      . TRICOR 145 MG tablet TAKE 1 TABLET BY MOUTH EVERY DAY  90 tablet  1  . fluticasone (FLONASE) 50 MCG/ACT nasal spray Place 1 spray into both nostrils 2 (two) times daily.       No facility-administered medications prior to visit.      Review of Systems  Constitutional: Negative for fever and unexpected weight change.  HENT: Positive for dental problem, postnasal drip and sinus pressure. Negative for congestion, ear pain, nosebleeds, rhinorrhea, sneezing, sore throat and trouble swallowing.   Eyes: Negative for redness and itching.  Respiratory: Positive for cough and shortness of breath. Negative for chest tightness and wheezing.   Cardiovascular: Negative for palpitations and leg swelling.  Gastrointestinal: Negative for nausea and vomiting.  Genitourinary: Negative for dysuria.  Musculoskeletal: Positive for joint swelling.  Skin: Negative for rash.  Neurological: Positive for headaches.  Hematological: Does not bruise/bleed easily.  Psychiatric/Behavioral: Negative for dysphoric mood. The patient is not nervous/anxious.        Objective:   Physical Exam Filed Vitals:   12/06/13 1453  BP: 132/70  Pulse: 60  Height: 5' 8" (1.727 m)  Weight: 193 lb (87.544 kg)  SpO2: 97%  RA  Gen: well appearing, no acute distress HEENT: NCAT, PERRL, EOMi, OP clear, neck supple without masses PULM: CTA B CV: RRR, no mgr, no JVD AB: BS+, soft, nontender,  no hsm Ext: warm, no edema, no clubbing, no cyanosis Derm: no rash or skin  breakdown Neuro: A&Ox4, CN II-XII intact, strength 5/5 in all 4 extremities       Assessment & Plan:   Cough Today a simple spirometry test was completely normal so he does not have COPD despite his years of tobacco use. By report, his chest x-ray was normal but I would like to see a copy of this report myself (or better yet the images). We will request those.  Explained to him today that the most likely explanation for his ongoing cough is his postnasal drip. This is a problem which is refractory to therapy. It is not clear to me that he has gastroesophageal reflux disease and he is not on any medications which would commonly be associated with cough. However, I do not have the side effects of his study drug.  Based on his lung exam and normal spirometry today I do not see clear evidence of underlying lung disease.  Plan: -Request results of chest x-ray -Tessalon Perles -Treat postnasal drip with chlorpheniramine-phenylephrine tablets, saline rinses, and a nasal steroid -If no improvement with adequate treatment of sinus disease then start a proton pump inhibitor -Followup with me in 4-6 weeks if no improvement  Tobacco use disorder Advised at length to quit today. He'll try quit cold [place].  Allergic rhinitis As noted above, I believe this is the primary reason for his cough.  Plan: -Saline rinses, antihistamine/decongestant combination, nasal steroid    Updated Medication List Outpatient Encounter Prescriptions as of 12/06/2013  Medication Sig  . aspirin 81 MG tablet Take 81 mg by mouth daily.    Marland Kitchen BENICAR HCT 20-12.5 MG per tablet TAKE 1 TABLET BY MOUTH EVERY DAY  . clopidogrel (PLAVIX) 75 MG tablet TAKE 1 TABLET BY MOUTH EVERY DAY  . Glucose Blood (FREESTYLE LITE TEST VI)   . Hypertonic Nasal Wash (SINUS RINSE BOTTLE KIT NA) Place into the nose 2 (two) times daily.  . Hypromellose 0.3 % SOLN 1  drop. Into each eye two times daily   . insulin glargine (LANTUS) 100 UNIT/ML injection Inject 30 Units into the skin 2 (two) times daily. 30 units am and 30 units pm  . loratadine (CLARITIN) 10 MG tablet Take 10 mg by mouth daily.  . metFORMIN (GLUCOPHAGE) 1000 MG tablet Take 1,000 mg by mouth 2 (two) times daily with a meal.    . metoprolol (LOPRESSOR) 50 MG tablet TAKE 1 AND 1/2 TABLETS BY MOUTH TWICE DAILY  . nitroGLYCERIN (NITROSTAT) 0.4 MG SL tablet Place 1 tablet (0.4 mg total) under the tongue every 5 (five) minutes as needed.  . Omega-3 Fatty Acids (FISH OIL) 1000 MG CAPS Take 1,000 mg by mouth 2 (two) times daily.  . pravastatin (PRAVACHOL) 80 MG tablet TAKE 1 TABLET (80 MG TOTAL) BY MOUTH EVERY EVENING.  . STUDY MEDICATION Take 130 mg by mouth daily. Evacetrapib  . TRICOR 145 MG tablet TAKE 1 TABLET BY MOUTH EVERY DAY  . benzonatate (TESSALON) 100 MG capsule Take 1 capsule (100 mg total) by mouth every 6 (six) hours as needed for cough.  . [DISCONTINUED] fluticasone (FLONASE) 50 MCG/ACT nasal spray Place 1 spray into both nostrils 2 (two) times daily.

## 2013-12-06 NOTE — Assessment & Plan Note (Signed)
Today a simple spirometry test was completely normal so he does not have COPD despite his years of tobacco use. By report, his chest x-ray was normal but I would like to see a copy of this report myself (or better yet the images). We will request those.  Explained to him today that the most likely explanation for his ongoing cough is his postnasal drip. This is a problem which is refractory to therapy. It is not clear to me that he has gastroesophageal reflux disease and he is not on any medications which would commonly be associated with cough. However, I do not have the side effects of his study drug.  Based on his lung exam and normal spirometry today I do not see clear evidence of underlying lung disease.  Plan: -Request results of chest x-ray -Tessalon Perles -Treat postnasal drip with chlorpheniramine-phenylephrine tablets, saline rinses, and a nasal steroid -If no improvement with adequate treatment of sinus disease then start a proton pump inhibitor -Followup with me in 4-6 weeks if no improvement

## 2013-12-06 NOTE — Assessment & Plan Note (Signed)
Advised at length to quit today. He'll try quit cold [place].

## 2013-12-19 ENCOUNTER — Telehealth: Payer: Self-pay

## 2013-12-19 NOTE — Telephone Encounter (Signed)
Spoke with pt, he is aware of cxr results.  Nothing further needed. 

## 2013-12-19 NOTE — Telephone Encounter (Signed)
Message copied by Len Blalock on Wed Dec 19, 2013  4:48 PM ------      Message from: Marvin Salinas      Created: Tue Dec 18, 2013 11:22 PM       A,            Please let him know that his CXR was normal            Thanks      B ------

## 2014-01-21 ENCOUNTER — Ambulatory Visit: Payer: Medicare Other | Admitting: Nurse Practitioner

## 2014-01-29 ENCOUNTER — Ambulatory Visit: Payer: Medicare Other | Admitting: Pulmonary Disease

## 2014-02-27 ENCOUNTER — Ambulatory Visit (INDEPENDENT_AMBULATORY_CARE_PROVIDER_SITE_OTHER): Payer: Medicare Other | Admitting: Nurse Practitioner

## 2014-02-27 ENCOUNTER — Encounter: Payer: Self-pay | Admitting: Nurse Practitioner

## 2014-02-27 VITALS — BP 100/60 | HR 50 | Ht 68.0 in | Wt 187.0 lb

## 2014-02-27 DIAGNOSIS — I259 Chronic ischemic heart disease, unspecified: Secondary | ICD-10-CM

## 2014-02-27 DIAGNOSIS — I10 Essential (primary) hypertension: Secondary | ICD-10-CM

## 2014-02-27 DIAGNOSIS — I25810 Atherosclerosis of coronary artery bypass graft(s) without angina pectoris: Secondary | ICD-10-CM

## 2014-02-27 NOTE — Progress Notes (Signed)
Marvin Salinas Date of Birth: 10/05/1945 Medical Record #[id number]  History of Present Illness: Marvin Salinas is seen back today for a 6 month check. Seen for Dr. Acie Salinas. Former patient of Dr. Susa Salinas. He has known CAD with prior CABG back in 2011. Other issues include HTN, HLD, DM and extensive cerebral vascular disease with prior TIAs, remote history of small left cerebellar stroke and moderate extracranial carotid stenosis and severe proximal basilar and distal right vertebral artery stenosis with failed attempt at angioplasty stenting in the past. He is on chronic Plavix and aspirin therapy. Was on coumadin in the remote past. Last carotid doppler in February 2014 showed 40 to 60% bilateral ICA stenoses with a moderate left subclavian stenosis. He has been a prior smoker.   Last seen back in March and was doing ok from our standpoint. His labs are checked by his PCP.   Comes back today. Here alone. Doing ok. Denies smoking - but probably smoked some a few months ago. No chest pain. Not short of breath. Not dizzy or lightheaded. He is back painting some. Notices some irregularity with his pulse when he checks his BP. No actual palpitations. Labs have been checked by his PCP recently. Says his sugar is ok. He did go to see pulmonary back in the summer for a cough - was given some medicine - did not see really any improvement. He is thinking about going back.    Current Outpatient Prescriptions  Medication Sig Dispense Refill  . aspirin 81 MG tablet Take 81 mg by mouth daily.        Marland Kitchen BENICAR HCT 20-12.5 MG per tablet TAKE 1 TABLET BY MOUTH EVERY DAY  90 tablet  1  . clopidogrel (PLAVIX) 75 MG tablet TAKE 1 TABLET BY MOUTH EVERY DAY  90 tablet  1  . Glucose Blood (FREESTYLE LITE TEST VI)       . Hypertonic Nasal Wash (SINUS RINSE BOTTLE KIT NA) Place into the nose 2 (two) times daily.      . Hypromellose 0.3 % SOLN 1 drop. Into each eye two times daily       . insulin glargine (LANTUS) 100 UNIT/ML  injection Inject 30 Units into the skin 2 (two) times daily. 30 units am and 30 units pm      . loratadine (CLARITIN) 10 MG tablet Take 10 mg by mouth daily.      . metFORMIN (GLUCOPHAGE) 1000 MG tablet Take 1,000 mg by mouth 2 (two) times daily with a meal.        . metoprolol (LOPRESSOR) 50 MG tablet TAKE 1 AND 1/2 TABLETS BY MOUTH TWICE DAILY  270 tablet  3  . nitroGLYCERIN (NITROSTAT) 0.4 MG SL tablet Place 1 tablet (0.4 mg total) under the tongue every 5 (five) minutes as needed.  25 tablet  11  . Omega-3 Fatty Acids (FISH OIL) 1000 MG CAPS Take 1,000 mg by mouth 2 (two) times daily.      . pravastatin (PRAVACHOL) 80 MG tablet TAKE 1 TABLET (80 MG TOTAL) BY MOUTH EVERY EVENING.  90 tablet  3  . STUDY MEDICATION Take 130 mg by mouth daily. Evacetrapib      . TRICOR 145 MG tablet TAKE 1 TABLET BY MOUTH EVERY DAY  90 tablet  1   No current facility-administered medications for this visit.    Allergies  Allergen Reactions  . Codeine Nausea And Vomiting  . Latex Hives  . Lisinopril Cough    Past  Medical History  Diagnosis Date  . Hypertension   . Hyperlipidemia   . Diabetes mellitus     on insulin  . Ischemic heart disease     prior PCI to RCA in 1989. S/P PCI to LAD and OM in 1992. S/P PCI to first DX in 2000. S/P CABG x 3 in May 2011  . Depression   . Sleep apnea   . Peripheral neuropathy   . History of renal calculi   . Back pain   . S/P CABG x 21 Oct 2009  . Cerebral vascular disease     with prior TIA's; followed by Dr. Erling Salinas  . Basilar artery stenosis     on chronic Plavix  . Pneumonia Sept 2012    Past Surgical History  Procedure Laterality Date  . Coronary artery bypass graft  11/12/2009    LIMA to LAD, SVG to OM and SVG to RCA  . Angioplasty  1989    right coronary artery  . Angioplasty  1992    LAD and OM  . Angioplasty  1998    First DX  . Coronary stent placement  2000    Stent to LAD/Circumflex with angioplasty to first diagonal     History  Smoking  status  . Former Smoker -- 1.00 packs/day for 54 years  . Types: Cigarettes  . Quit date: 10/19/2009  Smokeless tobacco  . Never Used    Comment: Down to 2 cigs some days 12/06/13    History  Alcohol Use No    Family History  Problem Relation Age of Onset  . Hypertension Father   . Heart disease Father   . Asthma Brother   . Stroke      Uncle  . Heart attack Father     Review of Systems: The review of systems is per the HPI.  All other systems were reviewed and are negative.  Physical Exam: BP 100/60  Pulse 50  Ht '5\' 8"'  (1.727 m)  Wt 187 lb (84.823 kg)  BMI 28.44 kg/m2  SpO2 100% Patient is very pleasant and in no acute distress. Skin is warm and dry. Color is normal.  HEENT is unremarkable. Normocephalic/atraumatic. PERRL. Sclera are nonicteric. Neck is supple. No masses. No JVD. Lungs are clear. Cardiac exam shows a somewhat irregular rate. Occasional ectopic. Abdomen is soft. Extremities are without edema. Gait and ROM are intact. No gross neurologic deficits noted.  Wt Readings from Last 3 Encounters:  02/27/14 187 lb (84.823 kg)  12/06/13 193 lb (87.544 kg)  08/21/13 191 lb 1.9 oz (86.691 kg)    LABORATORY DATA/PROCEDURES: EKG today shows sinus brady. Lateral T wave changes.   Lab Results  Component Value Date   WBC 12.9* 03/05/2011   HGB 14.4 03/05/2011   HCT 39.5 03/05/2011   PLT 239 03/05/2011   GLUCOSE 130* 03/15/2011   ALT 25 11/10/2009   AST 36 11/10/2009   NA 140 03/15/2011   K 4.6 03/15/2011   CL 103 03/15/2011   CREATININE 1.4 03/15/2011   BUN 22 03/15/2011   CO2 28 03/15/2011   INR 1.40 11/12/2009   HGBA1C  Value: 10.4 (NOTE)  According to the ADA Clinical Practice Recommendations for 2011, when HbA1c is used as a screening test:   >=6.5%   Diagnostic of Diabetes Mellitus           (if abnormal result  is confirmed)  5.7-6.4%   Increased risk of developing Diabetes Mellitus   References:Diagnosis and Classification of Diabetes Mellitus,Diabetes [id number],6191,55(KAJJA 1):S62-S69 and Standards of Medical Care in         Diabetes - 2011,Diabetes [id number],0941,79  (Suppl 1):S11-S61.* 11/10/2009    BNP (last 3 results) No results found for this basename: PROBNP,  in the last 8760 hours   CAROTID DUPLEX 40-59% bilateral carotid stenosis. Unchanged from Feb. 2014. Repeat carotid doppler in 2 years.  Peter Martinique MD, [facility]   Assessment / Plan: 1. CAD - stable with no symptoms reported. Managed medically.   2. HTN - BP looks great. No change in current therapy.  3. HLD - remains on lipid lowering therapy. Labs checked by his PCP  4. Tobacco abuse - not smoking per his report. Continued cessation encouraged.    5. Extensive cerebrovascular disease - known carotid disease - continue ASA & Plavix - last duplex back in April of 2015 for repeat study in 2 years.   See back in 6 months.   Patient is agreeable to this plan and will call if any problems develop in the interim.   Burtis Junes, RN, [place] [address] [place], [place]  [address] [phone]

## 2014-02-27 NOTE — Patient Instructions (Signed)
I will see you in 6 months  Stay on your current medicines  Don't smoke  Call the [place] office at [phone] if you have any questions, problems or concerns.

## 2014-03-23 ENCOUNTER — Other Ambulatory Visit: Payer: Self-pay | Admitting: Cardiovascular Disease

## 2014-04-01 ENCOUNTER — Telehealth: Payer: Self-pay | Admitting: Neurology

## 2014-04-01 NOTE — Telephone Encounter (Signed)
Patient returned my phonecall.  Left message that he received my message about stopping study drug and that 04/17/14 at 2:30pm will work for his End of Study appt.

## 2014-04-01 NOTE — Telephone Encounter (Signed)
Left message for patient to stop taking his study drug because the sponsor is terminating the study.  Informed patient to call me to schedule end of study visit.

## 2014-04-17 ENCOUNTER — Encounter: Payer: Self-pay | Admitting: Nurse Practitioner

## 2014-04-17 ENCOUNTER — Ambulatory Visit (INDEPENDENT_AMBULATORY_CARE_PROVIDER_SITE_OTHER): Payer: Medicare Other | Admitting: Nurse Practitioner

## 2014-04-17 ENCOUNTER — Ambulatory Visit (INDEPENDENT_AMBULATORY_CARE_PROVIDER_SITE_OTHER): Payer: Self-pay | Admitting: Neurology

## 2014-04-17 VITALS — BP 112/63 | HR 53 | Wt 193.0 lb

## 2014-04-17 VITALS — BP 118/67 | HR 53 | Ht 68.0 in | Wt 193.2 lb

## 2014-04-17 DIAGNOSIS — N400 Enlarged prostate without lower urinary tract symptoms: Secondary | ICD-10-CM

## 2014-04-17 DIAGNOSIS — N183 Chronic kidney disease, stage 3 unspecified: Secondary | ICD-10-CM | POA: Insufficient documentation

## 2014-04-17 DIAGNOSIS — I639 Cerebral infarction, unspecified: Secondary | ICD-10-CM

## 2014-04-17 DIAGNOSIS — I10 Essential (primary) hypertension: Secondary | ICD-10-CM | POA: Insufficient documentation

## 2014-04-17 DIAGNOSIS — E119 Type 2 diabetes mellitus without complications: Secondary | ICD-10-CM | POA: Insufficient documentation

## 2014-04-17 DIAGNOSIS — I2510 Atherosclerotic heart disease of native coronary artery without angina pectoris: Secondary | ICD-10-CM | POA: Insufficient documentation

## 2014-04-17 DIAGNOSIS — Z8673 Personal history of transient ischemic attack (TIA), and cerebral infarction without residual deficits: Secondary | ICD-10-CM | POA: Insufficient documentation

## 2014-04-17 DIAGNOSIS — E1140 Type 2 diabetes mellitus with diabetic neuropathy, unspecified: Secondary | ICD-10-CM | POA: Insufficient documentation

## 2014-04-17 DIAGNOSIS — I679 Cerebrovascular disease, unspecified: Secondary | ICD-10-CM

## 2014-04-17 HISTORY — DX: Benign prostatic hyperplasia without lower urinary tract symptoms: N40.0

## 2014-04-17 MED ORDER — CLOPIDOGREL BISULFATE 75 MG PO TABS: ORAL_TABLET | ORAL | Status: DC

## 2014-04-17 NOTE — Progress Notes (Signed)
I agree with the above plan 

## 2014-04-17 NOTE — Progress Notes (Signed)
Patient is here for Visit 19 in the Accelerate Study. Pt. completed the health questionnaire per protocol. Conmeds and AEs were reviewed and pt. reports no changes.  Vital signs were collected per protocol. Drug accountability was done.   Dr. Leonie Man performed PE per protocol.  Patient had no complaints today.        Awake alert. Afebrile. Head is nontraumatic. Neck is supple without bruit. Hearing is normal. Cardiac exam no murmur or gallop. Lungs are clear to auscultation. Distal pulses are well felt. Neurological Exam ;  Awake  Alert oriented x 3. Normal speech and language.eye movements full without nystagmus.fundi were not visualized. Vision acuity and fields appear normal. Hearing is normal. Palatal movements are normal. Face symmetric. Tongue midline. Normal strength, tone, reflexes and coordination. Normal sensation. Gait deferred.

## 2014-04-17 NOTE — Patient Instructions (Signed)
He was advised to continue aspirin and Plavix given his significant cardiac disease. Maintain strict control of diabetes with hemoglobin A1c goal below 6.5%, hypertension but blood pressure goal below 120/80 and lipids with LDL cholesterol goal below 70 mg percent. Return for followup in one year with Dr. Leonie Man or call earlier if necessary.   Stroke Prevention Some medical conditions and behaviors are associated with an increased chance of having a stroke. You may prevent a stroke by making healthy choices and managing medical conditions. HOW CAN I REDUCE MY RISK OF HAVING A STROKE?   Stay physically active. Get at least 30 minutes of activity on most or all days.  Do not smoke. It may also be helpful to avoid exposure to secondhand smoke.  Limit alcohol use. Moderate alcohol use is considered to be:  No more than 2 drinks per day for men.  No more than 1 drink per day for nonpregnant women.  Eat healthy foods. This involves:  Eating 5 or more servings of fruits and vegetables a day.  Making dietary changes that address high blood pressure (hypertension), high cholesterol, diabetes, or obesity.  Manage your cholesterol levels.  Making food choices that are high in fiber and low in saturated fat, trans fat, and cholesterol may control cholesterol levels.  Take any prescribed medicines to control cholesterol as directed by your health care provider.  Manage your diabetes.  Controlling your carbohydrate and sugar intake is recommended to manage diabetes.  Take any prescribed medicines to control diabetes as directed by your health care provider.  Control your hypertension.  Making food choices that are low in salt (sodium), saturated fat, trans fat, and cholesterol is recommended to manage hypertension.  Take any prescribed medicines to control hypertension as directed by your health care provider.  Maintain a healthy weight.  Reducing calorie intake and making food choices  that are low in sodium, saturated fat, trans fat, and cholesterol are recommended to manage weight.  Stop drug abuse.  Avoid taking birth control pills.  Talk to your health care provider about the risks of taking birth control pills if you are over 46 years old, smoke, get migraines, or have ever had a blood clot.  Get evaluated for sleep disorders (sleep apnea).  Talk to your health care provider about getting a sleep evaluation if you snore a lot or have excessive sleepiness.  Take medicines only as directed by your health care provider.  For some people, aspirin or blood thinners (anticoagulants) are helpful in reducing the risk of forming abnormal blood clots that can lead to stroke. If you have the irregular heart rhythm of atrial fibrillation, you should be on a blood thinner unless there is a good reason you cannot take them.  Understand all your medicine instructions.  Make sure that other conditions (such as anemia or atherosclerosis) are addressed. SEEK IMMEDIATE MEDICAL CARE IF:   You have sudden weakness or numbness of the face, arm, or leg, especially on one side of the body.  Your face or eyelid droops to one side.  You have sudden confusion.  You have trouble speaking (aphasia) or understanding.  You have sudden trouble seeing in one or both eyes.  You have sudden trouble walking.  You have dizziness.  You have a loss of balance or coordination.  You have a sudden, severe headache with no known cause.  You have new chest pain or an irregular heartbeat. Any of these symptoms may represent a serious problem that is  an emergency. Do not wait to see if the symptoms will go away. Get medical help at once. Call your local emergency services (911 in [place] Do not drive yourself to the hospital. Document Released: 07/15/2004 Document Revised: 10/22/2013 Document Reviewed: 12/08/2012 [facility] Patient Information 2015 [place], [place]. This information is not intended  to replace advice given to you by your health care provider. Make sure you discuss any questions you have with your health care provider.

## 2014-04-17 NOTE — Progress Notes (Signed)
PATIENT: Marvin Salinas DOB: 1945-09-24  REASON FOR VISIT: routine follow up for stroke HISTORY FROM: patient  HISTORY OF PRESENT ILLNESS: 68 year patient male with a remote history of small left cerebellar stroke and moderate extracranial carotid stenosis and severe proximal basilar and distal right vertebral artery stenosis with failed attempt at angioplasty stenting in the past.   Update 04/17/14 (LL): Since last visit he reports that he has been well.  He had repeat carotid doppler study in April 2015 which showed 40-59% bilateral carotid stenosis, unchanged from Feb. 2014. Blood pressure is well controlled, it is 118/67 in the office today. He states that he had recent labs checked at his pcp and was told that all were well.  His fasting blood sugar averages 130.  He states he has been diabetic for over 15 years. He now has painful neuropathy in his feet, just on the bottoms.  He does not want to take anything for the burning until he has to. He is tolerating aspirin and Plavix well without significant bleeding or bruising. He continues to participate in the [employer] study. He denies any stroke-like symptoms.  Update 04/18/13 He is seen today for followup for transfer of neurological care after last visit with Dr. love on 07/20/12. He has history of a small right cerebellar infarct on 04/16/2001 at that time he was found to have proximal left vertebral artery, proximal left common carotid artery and both external carotid artery stenosis as well as intracranial stenosis involving mid basilar artery. Catheter angiogram confirmed basilar stenosis and angioplasty stenting was attempted but was unsuccessful. He was subsequently treated with Coumadin for a while. Followup studies in 2003 confirmed basilar artery as well as distal right and left vertebral artery stenosis. He had had 2 transient episodes of dizziness in February 2013 which were questionable TIA versus vestibular in origin. He had done  well since then and has not had a recurrent strokes or TIAs. He has had followup serial carotid ultrasounds done at about cardiology and has found to be stable 40-50% ICA stenosis. His last MRA study was in March May 2013 and which showed high-grade stenosis of basilar artery, multifocal narrowing of the right vertebral artery and high-grade stenosis of the left vertebrobasilar junction. He states his cholesterol has been under good control as well as his blood pressure has been quite good. He has no symptoms today. He remains on aspirin and Plavix and is tolerating it well with only minor bruising. He states his average blood glucose has been 114 with today's fasting being 93 mg percent. His last lipid profile when checked earlier this year was fine. He is also participating in the [place] trial and last saw Dr. Krista Blue in May 2014.   ROS:  14 system review of systems is positive for runny nose, cough, wheezing, eye discharge, eye itching, abdominal pain, apnea, frequent waking, daytime sleepiness, snoring, joint pain, back pain, aching muscles, muscle cramps, neck stiffness, moles, bruise easily, numbness, depression, allergies, urination problems.  ALLERGIES: Allergies  Allergen Reactions  . Codeine Nausea And Vomiting  . Latex Hives  . Lisinopril Cough    HOME MEDICATIONS: Outpatient Prescriptions Prior to Visit  Medication Sig Dispense Refill  . aspirin 81 MG tablet Take 81 mg by mouth daily.        Marland Kitchen BENICAR HCT 20-12.5 MG per tablet TAKE 1 TABLET BY MOUTH EVERY DAY  90 tablet  1  . Glucose Blood (FREESTYLE LITE TEST VI)       .  Hypertonic Nasal Wash (SINUS RINSE BOTTLE KIT NA) Place into the nose 2 (two) times daily.      . Hypromellose 0.3 % SOLN 1 drop. Into each eye two times daily       . insulin glargine (LANTUS) 100 UNIT/ML injection Inject 30 Units into the skin 2 (two) times daily. 30 units am and 30 units pm      . loratadine (CLARITIN) 10 MG tablet Take 10 mg by mouth daily.       . metFORMIN (GLUCOPHAGE) 1000 MG tablet Take 1,000 mg by mouth 2 (two) times daily with a meal.        . metoprolol (LOPRESSOR) 50 MG tablet TAKE 1 AND 1/2 TABLETS BY MOUTH TWICE DAILY  270 tablet  3  . nitroGLYCERIN (NITROSTAT) 0.4 MG SL tablet Place 1 tablet (0.4 mg total) under the tongue every 5 (five) minutes as needed.  25 tablet  11  . Omega-3 Fatty Acids (FISH OIL) 1000 MG CAPS Take 1,000 mg by mouth 2 (two) times daily.      . pravastatin (PRAVACHOL) 80 MG tablet TAKE 1 TABLET (80 MG TOTAL) BY MOUTH EVERY EVENING.  90 tablet  3  . STUDY MEDICATION Take 130 mg by mouth daily. Evacetrapib      . TRICOR 145 MG tablet TAKE 1 TABLET BY MOUTH EVERY DAY  90 tablet  1  . clopidogrel (PLAVIX) 75 MG tablet TAKE 1 TABLET BY MOUTH EVERY DAY  90 tablet  1   No facility-administered medications prior to visit.    PHYSICAL EXAM Filed Vitals:   04/17/14 1326  BP: 118/67  Pulse: 53  Height: '5\' 8"'  (1.727 m)  Weight: 193 lb 3.2 oz (87.635 kg)   Body mass index is 29.38 kg/(m^2).  Physical Exam  General: well developed, well nourished, seated, in no evident distress  Head: head normocephalic and atraumatic. Orohparynx benign  Neck: supple with no carotid or supraclavicular bruits  Cardiovascular: regular rate and rhythm, no murmurs  Musculoskeletal: no deformity  Skin: no rash/petichiae  Vascular: Normal pulses all extremities   Neurologic Exam  Mental Status: Awake and fully alert. Oriented to place and time. Recent and remote memory intact. Attention span, concentration and fund of knowledge appropriate. Mood and affect appropriate.  Cranial Nerves: Fundoscopic exam reveals sharp disc margins. Pupils equal, briskly reactive to light. Extraocular movements full without nystagmus. Visual fields full to confrontation. Hearing intact. Facial sensation intact. Face, tongue, palate moves normally and symmetrically.  Motor: Normal bulk and tone. Normal strength in all tested extremity muscles.    Sensory: diminished touch and pinprick and vibratory sensation in stocking distribution bilaterally.  Coordination: Rapid alternating movements normal in all extremities. Finger-to-nose and heel-to-shin performed accurately bilaterally.  Gait and Station: Arises from chair without difficulty. Stance is normal. Gait demonstrates normal stride length and balance. Able to heel, toe and tandem walk without difficulty.  Reflexes: 1+ and symmetric. Toes downgoing.   ASSESSMENT: 68 year-old Caucasian male with a remote history of small left cerebellar stroke and moderate extracranial carotid stenosis and severe proximal basilar and distal right vertebral artery stenosis with failed attempt at angioplasty stenting in the past. He is doing well from a neurovascular standpoint without any recurrent symptom for several years. Is also participating in the [employer] trial.  PLAN:  He was advised to continue aspirin and Plavix given his significant cardiac disease. Maintain strict control of diabetes with hemoglobin A1c goal below 6.5%, hypertension but blood pressure goal below  120/80 and lipids with LDL cholesterol goal below 70 mg percent. Return for followup in one year with Dr. Leonie Man or call earlier if necessary.  Meds ordered this encounter  Medications  . clopidogrel (PLAVIX) 75 MG tablet    Sig: TAKE 1 TABLET BY MOUTH EVERY DAY    Dispense:  90 tablet    Refill:  3    Order Specific Question:  Supervising Provider    Answer:  Leonie Man, PRAMOD [[id number]]   Rudi Rummage Erisa Mehlman, MSN, FNP-BC, A/GNP-C 04/17/2014, 2:02 PM [facility] [address] [place], [place] [address] [phone]  Note: This document was prepared with digital dictation and possible smart phrase technology. Any transcriptional errors that result from this process are unintentional.

## 2014-06-16 ENCOUNTER — Emergency Department (HOSPITAL_BASED_OUTPATIENT_CLINIC_OR_DEPARTMENT_OTHER)
Admission: EM | Admit: 2014-06-16 | Discharge: 2014-06-16 | Disposition: A | Discharge status: Home / Self Care | Payer: Medicare Other | Attending: Emergency Medicine | Admitting: Emergency Medicine

## 2014-06-16 ENCOUNTER — Emergency Department (HOSPITAL_BASED_OUTPATIENT_CLINIC_OR_DEPARTMENT_OTHER): Payer: Medicare Other

## 2014-06-16 ENCOUNTER — Encounter (HOSPITAL_BASED_OUTPATIENT_CLINIC_OR_DEPARTMENT_OTHER): Payer: Self-pay

## 2014-06-16 DIAGNOSIS — Z8701 Personal history of pneumonia (recurrent): Secondary | ICD-10-CM | POA: Insufficient documentation

## 2014-06-16 DIAGNOSIS — I10 Essential (primary) hypertension: Secondary | ICD-10-CM | POA: Diagnosis not present

## 2014-06-16 DIAGNOSIS — Z951 Presence of aortocoronary bypass graft: Secondary | ICD-10-CM | POA: Diagnosis not present

## 2014-06-16 DIAGNOSIS — Z87891 Personal history of nicotine dependence: Secondary | ICD-10-CM | POA: Insufficient documentation

## 2014-06-16 DIAGNOSIS — Z79899 Other long term (current) drug therapy: Secondary | ICD-10-CM | POA: Diagnosis not present

## 2014-06-16 DIAGNOSIS — R1032 Left lower quadrant pain: Secondary | ICD-10-CM | POA: Diagnosis not present

## 2014-06-16 DIAGNOSIS — E785 Hyperlipidemia, unspecified: Secondary | ICD-10-CM | POA: Insufficient documentation

## 2014-06-16 DIAGNOSIS — Z7982 Long term (current) use of aspirin: Secondary | ICD-10-CM | POA: Diagnosis not present

## 2014-06-16 DIAGNOSIS — F329 Major depressive disorder, single episode, unspecified: Secondary | ICD-10-CM | POA: Diagnosis not present

## 2014-06-16 DIAGNOSIS — R197 Diarrhea, unspecified: Secondary | ICD-10-CM

## 2014-06-16 DIAGNOSIS — Z794 Long term (current) use of insulin: Secondary | ICD-10-CM | POA: Insufficient documentation

## 2014-06-16 DIAGNOSIS — Z8669 Personal history of other diseases of the nervous system and sense organs: Secondary | ICD-10-CM | POA: Insufficient documentation

## 2014-06-16 DIAGNOSIS — Z7902 Long term (current) use of antithrombotics/antiplatelets: Secondary | ICD-10-CM | POA: Insufficient documentation

## 2014-06-16 LAB — COMPREHENSIVE METABOLIC PANEL
ALT: 39 U/L (ref 0–53)
AST: 48 U/L — ABNORMAL HIGH (ref 0–37)
Albumin: 4.8 g/dL (ref 3.5–5.2)
Alkaline Phosphatase: 46 U/L (ref 39–117)
Anion gap: 10 (ref 5–15)
BUN: 37 mg/dL — ABNORMAL HIGH (ref 6–23)
CO2: 26 mmol/L (ref 19–32)
Calcium: 9 mg/dL (ref 8.4–10.5)
Chloride: 102 mEq/L (ref 96–112)
Creatinine, Ser: 1.96 mg/dL — ABNORMAL HIGH (ref 0.50–1.35)
GFR calc Af Amer: 38 mL/min — ABNORMAL LOW (ref >90)
GFR calc non Af Amer: 33 mL/min — ABNORMAL LOW (ref >90)
Glucose, Bld: 130 mg/dL — ABNORMAL HIGH (ref 70–99)
Potassium: 4.2 mmol/L (ref 3.5–5.1)
Sodium: 138 mmol/L (ref 135–145)
Total Bilirubin: 0.8 mg/dL (ref 0.3–1.2)
Total Protein: 8.1 g/dL (ref 6.0–8.3)

## 2014-06-16 LAB — URINALYSIS, ROUTINE W REFLEX MICROSCOPIC
Bilirubin Urine: NEGATIVE
Glucose, UA: NEGATIVE mg/dL
Hgb urine dipstick: NEGATIVE
Ketones, ur: NEGATIVE mg/dL
Leukocytes, UA: NEGATIVE
Nitrite: NEGATIVE
Protein, ur: NEGATIVE mg/dL
Specific Gravity, Urine: 1.022 (ref 1.005–1.030)
Urobilinogen, UA: 0.2 mg/dL (ref 0.0–1.0)
pH: 5 (ref 5.0–8.0)

## 2014-06-16 LAB — CBC WITH DIFFERENTIAL/PLATELET
Basophils Absolute: 0 10*3/uL (ref 0.0–0.1)
Basophils Relative: 0 % (ref 0–1)
Eosinophils Absolute: 0.2 10*3/uL (ref 0.0–0.7)
Eosinophils Relative: 2 % (ref 0–5)
HCT: 50.3 % (ref 39.0–52.0)
Hemoglobin: 17.3 g/dL — ABNORMAL HIGH (ref 13.0–17.0)
Lymphocytes Relative: 13 % (ref 12–46)
Lymphs Abs: 1.4 10*3/uL (ref 0.7–4.0)
MCH: 31 pg (ref 26.0–34.0)
MCHC: 34.4 g/dL (ref 30.0–36.0)
MCV: 90.1 fL (ref 78.0–100.0)
Monocytes Absolute: 0.9 10*3/uL (ref 0.1–1.0)
Monocytes Relative: 8 % (ref 3–12)
Neutro Abs: 8.9 10*3/uL — ABNORMAL HIGH (ref 1.7–7.7)
Neutrophils Relative %: 77 % (ref 43–77)
Platelets: 246 10*3/uL (ref 150–400)
RBC: 5.58 MIL/uL (ref 4.22–5.81)
RDW: 13.6 % (ref 11.5–15.5)
WBC: 11.4 10*3/uL — ABNORMAL HIGH (ref 4.0–10.5)

## 2014-06-16 LAB — CBG MONITORING, ED: Glucose-Capillary: 136 mg/dL — ABNORMAL HIGH (ref 70–99)

## 2014-06-16 LAB — CLOSTRIDIUM DIFFICILE BY PCR: Toxigenic C. Difficile by PCR: NEGATIVE

## 2014-06-16 LAB — LIPASE, BLOOD: Lipase: 36 U/L (ref 11–59)

## 2014-06-16 LAB — I-STAT CG4 LACTIC ACID, ED: Lactic Acid, Venous: 0.49 mmol/L — ABNORMAL LOW (ref 0.5–2.2)

## 2014-06-16 MED ORDER — SODIUM CHLORIDE 0.9 % IV SOLN
Freq: Once | INTRAVENOUS | Status: AC
  Administered 2014-06-16: 12:00:00 via INTRAVENOUS

## 2014-06-16 MED ORDER — MORPHINE SULFATE 2 MG/ML IJ SOLN
2.0000 mg | Freq: Once | INTRAMUSCULAR | Status: AC
  Administered 2014-06-16: 2 mg via INTRAVENOUS
  Filled 2014-06-16: qty 1

## 2014-06-16 MED ORDER — IOHEXOL 300 MG/ML  SOLN: 100.0000 mL | Freq: Once | INTRAMUSCULAR | Status: DC | PRN

## 2014-06-16 MED ORDER — METRONIDAZOLE 500 MG PO TABS: 500.0000 mg | ORAL_TABLET | Freq: Three times a day (TID) | ORAL | Status: DC

## 2014-06-16 MED ORDER — IOHEXOL 300 MG/ML  SOLN
50.0000 mL | Freq: Once | INTRAMUSCULAR | Status: AC | PRN
Start: 2014-06-16 — End: 2014-06-16
  Administered 2014-06-16: 50 mL via ORAL

## 2014-06-16 MED ORDER — ONDANSETRON HCL 4 MG/2ML IJ SOLN
4.0000 mg | Freq: Once | INTRAMUSCULAR | Status: AC
  Administered 2014-06-16: 4 mg via INTRAVENOUS
  Filled 2014-06-16: qty 2

## 2014-06-16 MED ORDER — DIPHENOXYLATE-ATROPINE 2.5-0.025 MG PO TABS: 1.0000 | ORAL_TABLET | Freq: Four times a day (QID) | ORAL | Status: DC | PRN

## 2014-06-16 NOTE — Discharge Instructions (Signed)

## 2014-06-16 NOTE — ED Provider Notes (Signed)
CSN: [id number]     Arrival date & time 06/16/14  1104 History   First MD Initiated Contact with Patient 06/16/14 1137     Chief Complaint  Patient presents with  . nausea-diarrhea      (Consider location/radiation/quality/duration/timing/severity/associated sxs/prior Treatment) HPI Comments: Patient presents to the ER for evaluation of 3 days of diarrhea. Patient has been experiencing intermittent abdominal cramping with the symptoms but no continuous pain. He has not had any fever. He has had nausea but has not had any vomiting.   Past Medical History  Diagnosis Date  . Hypertension   . Hyperlipidemia   . Diabetes mellitus     on insulin  . Ischemic heart disease     prior PCI to RCA in 1989. S/P PCI to LAD and OM in 1992. S/P PCI to first DX in 2000. S/P CABG x 3 in May 2011  . Depression   . Sleep apnea   . Peripheral neuropathy   . History of renal calculi   . Back pain   . S/P CABG x 21 Oct 2009  . Cerebral vascular disease     with prior TIA's; followed by Dr. Erling Cruz  . Basilar artery stenosis     on chronic Plavix  . Pneumonia Sept 2012   Past Surgical History  Procedure Laterality Date  . Coronary artery bypass graft  11/12/2009    LIMA to LAD, SVG to OM and SVG to RCA  . Angioplasty  1989    right coronary artery  . Angioplasty  1992    LAD and OM  . Angioplasty  1998    First DX  . Coronary stent placement  2000    Stent to LAD/Circumflex with angioplasty to first diagonal    Family History  Problem Relation Age of Onset  . Hypertension Father   . Heart disease Father   . Asthma Brother   . Stroke      Uncle  . Heart attack Father    History  Substance Use Topics  . Smoking status: Former Smoker -- 1.00 packs/day for 54 years    Types: Cigarettes    Quit date: 10/19/2009  . Smokeless tobacco: Never Used  . Alcohol Use: No    Review of Systems  Gastrointestinal: Positive for nausea and diarrhea. Negative for vomiting.  All other systems  reviewed and are negative.     Allergies  Codeine; Latex; and Lisinopril  Home Medications   Prior to Admission medications   Medication Sig Start Date End Date Taking? Authorizing Provider  aspirin 81 MG tablet Take 81 mg by mouth daily.      Historical Provider, MD  BENICAR HCT 20-12.5 MG per tablet TAKE 1 TABLET BY MOUTH EVERY DAY 03/25/14   Thayer Headings, MD  clopidogrel (PLAVIX) 75 MG tablet TAKE 1 TABLET BY MOUTH EVERY DAY 04/17/14   Philmore Pali, NP  diphenoxylate-atropine (LOMOTIL) 2.5-0.025 MG per tablet Take 1 tablet by mouth 4 (four) times daily as needed for diarrhea or loose stools. 06/16/14   Orpah Greek, MD  Glucose Blood (FREESTYLE LITE TEST VI)  02/24/13   Historical Provider, MD  Hypertonic Nasal Wash (SINUS RINSE BOTTLE KIT NA) Place into the nose 2 (two) times daily.    Historical Provider, MD  Hypromellose 0.3 % SOLN 1 drop. Into each eye two times daily     Historical Provider, MD  insulin glargine (LANTUS) 100 UNIT/ML injection Inject 30 Units into the skin 2 (two)  times daily. 30 units am and 30 units pm    Historical Provider, MD  loratadine (CLARITIN) 10 MG tablet Take 10 mg by mouth daily.    Historical Provider, MD  metFORMIN (GLUCOPHAGE) 1000 MG tablet Take 1,000 mg by mouth 2 (two) times daily with a meal.      Historical Provider, MD  metoprolol (LOPRESSOR) 50 MG tablet TAKE 1 AND 1/2 TABLETS BY MOUTH TWICE DAILY 08/21/13   Burtis Junes, NP  metroNIDAZOLE (FLAGYL) 500 MG tablet Take 1 tablet (500 mg total) by mouth 3 (three) times daily. 06/16/14   Orpah Greek, MD  nitroGLYCERIN (NITROSTAT) 0.4 MG SL tablet Place 1 tablet (0.4 mg total) under the tongue every 5 (five) minutes as needed. 08/21/13   Burtis Junes, NP  Omega-3 Fatty Acids (FISH OIL) 1000 MG CAPS Take 1,000 mg by mouth 2 (two) times daily.    Historical Provider, MD  pravastatin (PRAVACHOL) 80 MG tablet TAKE 1 TABLET (80 MG TOTAL) BY MOUTH EVERY EVENING. 07/24/12   Burtis Junes, NP  STUDY MEDICATION Take 130 mg by mouth daily. Evacetrapib 10/27/11   Historical Provider, MD  TRICOR 145 MG tablet TAKE 1 TABLET BY MOUTH EVERY DAY 02/04/11   Burtis Junes, NP   BP 124/64 mmHg  Pulse 62  Temp(Src) 97.9 F (36.6 C) (Oral)  Resp 20  SpO2 100% Physical Exam  Constitutional: He is oriented to person, place, and time. He appears well-developed and well-nourished. No distress.  HENT:  Head: Normocephalic and atraumatic.  Right Ear: Hearing normal.  Left Ear: Hearing normal.  Nose: Nose normal.  Mouth/Throat: Oropharynx is clear and moist and mucous membranes are normal.  Eyes: Conjunctivae and EOM are normal. Pupils are equal, round, and reactive to light.  Neck: Normal range of motion. Neck supple.  Cardiovascular: Regular rhythm, S1 normal and S2 normal.  Exam reveals no gallop and no friction rub.   No murmur heard. Pulmonary/Chest: Effort normal and breath sounds normal. No respiratory distress. He exhibits no tenderness.  Abdominal: Soft. Normal appearance and bowel sounds are normal. There is no hepatosplenomegaly. There is tenderness (mild lower). There is no rebound, no guarding, no tenderness at McBurney's point and negative Murphy's sign. No hernia.  Musculoskeletal: Normal range of motion.  Neurological: He is alert and oriented to person, place, and time. He has normal strength. No cranial nerve deficit or sensory deficit. Coordination normal. GCS eye subscore is 4. GCS verbal subscore is 5. GCS motor subscore is 6.  Skin: Skin is warm, dry and intact. No rash noted. No cyanosis.  Psychiatric: He has a normal mood and affect. His speech is normal and behavior is normal. Thought content normal.  Nursing note and vitals reviewed.   ED Course  Procedures (including critical care time) Labs Review Labs Reviewed  CBC WITH DIFFERENTIAL - Abnormal; Notable for the following:    WBC 11.4 (*)    Hemoglobin 17.3 (*)    Neutro Abs 8.9 (*)    All other  components within normal limits  COMPREHENSIVE METABOLIC PANEL - Abnormal; Notable for the following:    Glucose, Bld 130 (*)    BUN 37 (*)    Creatinine, Ser 1.96 (*)    AST 48 (*)    GFR calc non Af Amer 33 (*)    GFR calc Af Amer 38 (*)    All other components within normal limits  I-STAT CG4 LACTIC ACID, ED - Abnormal; Notable for the  following:    Lactic Acid, Venous 0.49 (*)    All other components within normal limits  CLOSTRIDIUM DIFFICILE BY PCR  STOOL CULTURE  LIPASE, BLOOD  URINALYSIS, ROUTINE W REFLEX MICROSCOPIC  LACTIC ACID, PLASMA  CBG MONITORING, ED    Imaging Review Ct Abdomen Pelvis Wo Contrast  06/16/2014   CLINICAL DATA:  Acute left lower quadrant pain with nausea and diarrhea for 3 days. History hypertension, diabetes, renal insufficiency.  EXAM: CT ABDOMEN AND PELVIS WITHOUT CONTRAST  TECHNIQUE: Multidetector CT imaging of the abdomen and pelvis was performed following the standard protocol without IV contrast.  COMPARISON:  None available  FINDINGS: Lower chest: Very minor dependent basilar atelectasis. Lung bases otherwise clear. No lower lobe pneumonia, collapse or consolidation. No pneumothorax. Mildly prominent heart size. Coronary atherosclerosis evident. No pericardial or pleural effusion. No significant hiatal hernia.  Abdomen: Mild hypoattenuation of the liver parenchyma, suspect fatty infiltration. No biliary dilatation. Gallbladder, biliary system, pancreas, spleen, left adrenal gland, and kidneys are within normal limits for age and demonstrate no acute process. Nonspecific paraseptal renal stranding bilaterally without hydronephrosis, obstructive uropathy, or obstructing ureteral calculus. Right adrenal gland demonstrates a hypodense 49m nodule compatible with an adenoma, image 17.  Incidental duodenal diverticulum noted along the third portion of the duodenum. Negative for bowel obstruction, dilatation, ileus, or free air.  No abdominal free fluid, fluid  collection, hemorrhage, abscess, or adenopathy.  Abdominal aortic atherosclerosis noted without aneurysm.  normal retrocecal appendix identified.  Dilute contrast and air fluid levels throughout the colon compatible with ongoing diarrhea. No evidence of colonic wall thickening to suggest colitis.  Pelvis: No pelvic free fluid, fluid collection, hemorrhage, abscess, adenopathy, inguinal abnormality, or hernia. Iliac and femoral atherosclerosis noted. Distal colon is fluid-filled with air-fluid levels compatible with ongoing diarrhea. Diffuse degenerative changes of the lower lumbar spine. No acute compression fracture.  IMPRESSION: Fluid-filled colon with scattered air-fluid levels compatible with ongoing diarrhea. No evidence of colitis or diverticulitis.  Suspect fatty infiltration of the liver  Small right adrenal adenoma  Aortic atherosclerosis without aneurysm  Normal appendix   Electronically Signed   By: TDaryll BrodM.D.   On: 06/16/2014 14:47     EKG Interpretation None      MDM   Final diagnoses:  LLQ abdominal pain  Diarrhea    Presents to the ER for evaluation of diarrhea. Symptoms ongoing for 3 days. Patient has been experiencing intermittent cramping but has not had continuous pain. Examination did reveal diffuse tenderness without guarding or rebound. His workup has been unrevealing. Lab work does reveal elevated BUN and creatinine, he has had some chronic elevations in the past. He likely has some dehydration, was administered liter fluids. Electrolytes are unremarkable. A CT scan was performed and there was no evidence of diverticulitis, colitis, etc. Patient to be treated with Lomotil for symptomatic relief. C. difficile has been sent but is felt to be less likely. He was empirically started on Flagyl. Follow-up with his doctor this week.    COrpah Greek MD 06/16/14 [phone]

## 2014-06-16 NOTE — ED Notes (Signed)
Patient here with 3 days of nausea and multiple episodes of diarrhea. Reports abdominal cramping with same

## 2014-06-19 LAB — STOOL CULTURE

## 2014-08-28 ENCOUNTER — Ambulatory Visit: Payer: Medicare Other | Admitting: Nurse Practitioner

## 2014-09-02 ENCOUNTER — Encounter: Payer: Self-pay | Admitting: Physician Assistant

## 2014-09-02 ENCOUNTER — Ambulatory Visit (INDEPENDENT_AMBULATORY_CARE_PROVIDER_SITE_OTHER): Payer: Medicare Other | Admitting: Physician Assistant

## 2014-09-02 VITALS — BP 102/64 | HR 50 | Ht 68.0 in

## 2014-09-02 DIAGNOSIS — Z720 Tobacco use: Secondary | ICD-10-CM

## 2014-09-02 DIAGNOSIS — I10 Essential (primary) hypertension: Secondary | ICD-10-CM

## 2014-09-02 DIAGNOSIS — F17200 Nicotine dependence, unspecified, uncomplicated: Secondary | ICD-10-CM

## 2014-09-02 DIAGNOSIS — I679 Cerebrovascular disease, unspecified: Secondary | ICD-10-CM

## 2014-09-02 DIAGNOSIS — I2510 Atherosclerotic heart disease of native coronary artery without angina pectoris: Secondary | ICD-10-CM

## 2014-09-02 DIAGNOSIS — E785 Hyperlipidemia, unspecified: Secondary | ICD-10-CM

## 2014-09-02 NOTE — Assessment & Plan Note (Signed)
Carotid Doppler stable one year ago. We'll need repeat in 09/2015.

## 2014-09-02 NOTE — Assessment & Plan Note (Signed)
Scheduled for blood work this morning. Managed by primary care.

## 2014-09-02 NOTE — Patient Instructions (Addendum)
Your physician recommends that you continue on your current medications as directed. Please refer to the Current Medication list given to you today.    Your physician wants you to follow-up in:  [place] will receive a reminder letter in the mail two months in advance. If you don't receive a letter, please call our office to schedule the follow-up appointment.

## 2014-09-02 NOTE — Assessment & Plan Note (Signed)
Stable without chest pain. Follow-up with Dr.[name] in 1 year.

## 2014-09-02 NOTE — Assessment & Plan Note (Signed)
Blood pressure controlled. 

## 2014-09-02 NOTE — Assessment & Plan Note (Signed)
Patient quit smoking 

## 2014-09-02 NOTE — Progress Notes (Addendum)
Cardiology Office Note   Date:  09/02/2014   ID:  Marvin Salinas, DOB 1946/01/02, MRN [id number]  PCP:  Milagros Evener, MD  Cardiologist:  Grayland Jack, MD  Chief Complaint:    History of Present Illness: Marvin Salinas is a 69 y.o. male who presents for six-month follow-up. He has a history of CAD PCI to the RCA in 1989, PCI to the LAD and OM in 1992, PCI to the first diagonal in 2000, status post CABG x 3 in 2011. He also has hypertension, HLD, DM and extensive cerebral vascular disease with prior TIAs and history of small left cerebellar stroke with moderate extracranial carotid stenosis and severe proximal basilar and distal right vertebral artery stenosis with failed attempt at angioplasty stenting in the past. He is on chronic Plavix and aspirin therapy. Last carotid Dopplers in 09/2013 showed 40-59% bilateral carotid stenosis unchanged from 2014. Recommend repeat in 2 years.  Patient comes in today feeling quite well. He denies chest pain, palpitations, dyspnea, dyspnea on exertion, dizziness, or presyncope. 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 He has not had to use nitroglycerin. His sugars have been elevated and he is on his way to his primary care this morning for blood work and further treatment.  Past Medical History  Diagnosis Date  . Hypertension   . Hyperlipidemia   . Diabetes mellitus     on insulin  . Ischemic heart disease     prior PCI to RCA in 1989. S/P PCI to LAD and OM in 1992. S/P PCI to first DX in 2000. S/P CABG x 3 in May 2011  . Depression   . Sleep apnea   . Peripheral neuropathy   . History of renal calculi   . Back pain   . S/P CABG x 21 Oct 2009  . Cerebral vascular disease     with prior TIA's; followed by Dr. Erling Cruz  . Basilar artery stenosis     on chronic Plavix  . Pneumonia Sept 2012    Past Surgical History  Procedure Laterality Date  . Coronary artery bypass graft  11/12/2009    LIMA to LAD, SVG  to OM and SVG to RCA  . Angioplasty  1989    right coronary artery  . Angioplasty  1992    LAD and OM  . Angioplasty  1998    First DX  . Coronary stent placement  2000    Stent to LAD/Circumflex with angioplasty to first diagonal      Current Outpatient Prescriptions  Medication Sig Dispense Refill  . fluticasone (FLONASE) 50 MCG/ACT nasal spray   0  . aspirin 81 MG tablet Take 81 mg by mouth daily.      Marland Kitchen BENICAR HCT 20-12.5 MG per tablet TAKE 1 TABLET BY MOUTH EVERY DAY 90 tablet 1  . clopidogrel (PLAVIX) 75 MG tablet TAKE 1 TABLET BY MOUTH EVERY DAY 90 tablet 3  . diphenoxylate-atropine (LOMOTIL) 2.5-0.025 MG per tablet Take 1 tablet by mouth 4 (four) times daily as needed for diarrhea or loose stools. 30 tablet 0  . Hypertonic Nasal Wash (SINUS RINSE BOTTLE KIT NA) Place into the nose 2 (two) times daily.    . Hypromellose 0.3 % SOLN 1 drop. Into each eye two times daily     . insulin glargine (LANTUS) 100 UNIT/ML injection Inject 30 Units into the skin 2 (two) times daily. 30 units am and 30 units pm    .  loratadine (CLARITIN) 10 MG tablet Take 10 mg by mouth daily.    . metFORMIN (GLUCOPHAGE) 1000 MG tablet Take 1,000 mg by mouth 2 (two) times daily with a meal.      . metoprolol (LOPRESSOR) 50 MG tablet TAKE 1 AND 1/2 TABLETS BY MOUTH TWICE DAILY 270 tablet 3  . metroNIDAZOLE (FLAGYL) 500 MG tablet Take 1 tablet (500 mg total) by mouth 3 (three) times daily. 21 tablet 0  . nitroGLYCERIN (NITROSTAT) 0.4 MG SL tablet Place 1 tablet (0.4 mg total) under the tongue every 5 (five) minutes as needed. 25 tablet 11  . Omega-3 Fatty Acids (FISH OIL) 1000 MG CAPS Take 1,000 mg by mouth 2 (two) times daily.    . pravastatin (PRAVACHOL) 80 MG tablet TAKE 1 TABLET (80 MG TOTAL) BY MOUTH EVERY EVENING. 90 tablet 3  . STUDY MEDICATION Take 130 mg by mouth daily. Evacetrapib    . TRICOR 145 MG tablet TAKE 1 TABLET BY MOUTH EVERY DAY 90 tablet 1   No current facility-administered medications  for this visit.    Allergies:   Codeine; Lisinopril; and Latex    Social History:  The patient  reports that he quit smoking about 4 years ago. His smoking use included Cigarettes. He has a 54 pack-year smoking history. He has never used smokeless tobacco. He reports that he does not drink alcohol or use illicit drugs.   Family History:  The patient's family history includes Asthma in his brother; Heart attack in his father; Heart disease in his father; Hypertension in his father; Stroke in an other family member.    ROS:  Please see the history of present illness.   Otherwise, review of systems are positive for none.   All other systems are reviewed and negative.    PHYSICAL EXAM: VS:  Ht '5\' 8"'  (1.727 m) , BMI There is no weight on file to calculate BMI. GEN: Well nourished, well developed, in no acute distress Neck: Bilateral carotid bruits, no JVD, HJR, or masses Cardiac: RRR; no gallop ,murmurs, rubs, thrill or heave,no edema,   Respiratory:  clear to auscultation bilaterally, normal work of breathing GI: soft, nontender, nondistended, + BS MS: no deformity or atrophy Extremities: without cyanosis, clubbing, edema, good distal pulses bilaterally.  Skin: warm and dry, no rash Psych: euthymic mood, full affect   EKG:  EKG is not ordered today.    Recent Labs: 06/16/2014: ALT 39; BUN 37*; Creatinine 1.96*; Hemoglobin 17.3*; Platelets 246; Potassium 4.2; Sodium 138    Lipid Panel No results found for: CHOL, TRIG, HDL, CHOLHDL, VLDL, LDLCALC, LDLDIRECT    Wt Readings from Last 3 Encounters:  04/17/14 193 lb (87.544 kg)  04/17/14 193 lb 3.2 oz (87.635 kg)  02/27/14 187 lb (84.823 kg)      Other studies Reviewed: Additional studies/ records that were reviewed today include and review of the records demonstrates:  Carotid Dopplers in 09/2013 showed 40-59% bilateral ICA stenosis unchanged from February 2014. Repeat in 2 years.   ASSESSMENT AND PLAN:  CAD (coronary  artery disease) of artery bypass graft Stable without chest pain. Follow-up with Dr.[name] in 1 year.   Essential (primary) hypertension Blood pressure controlled   Diabetes mellitus, type 2 Sugars have been elevated. He sees his primary care this morning for adjustments in his insulin.   Hyperlipidemia Scheduled for blood work this morning. Managed by primary care.   Tobacco use disorder Patient quit smoking.   Cerebral vascular disease Carotid Doppler stable one  year ago. We'll need repeat in 09/2015.     Sumner Boast, PA-C  09/02/2014 8:01 AM    [facility] [place], [place],   [address] Phone: [phone]; Fax: [phone]

## 2014-09-02 NOTE — Assessment & Plan Note (Signed)
Sugars have been elevated. He sees his primary care this morning for adjustments in his insulin.

## 2014-09-19 ENCOUNTER — Other Ambulatory Visit: Payer: Self-pay | Admitting: Cardiovascular Disease

## 2014-09-27 ENCOUNTER — Other Ambulatory Visit: Payer: Self-pay | Admitting: Nurse Practitioner

## 2014-11-04 ENCOUNTER — Other Ambulatory Visit: Payer: Self-pay | Admitting: Family Medicine

## 2014-11-04 DIAGNOSIS — Z87891 Personal history of nicotine dependence: Secondary | ICD-10-CM

## 2014-11-04 DIAGNOSIS — Z136 Encounter for screening for cardiovascular disorders: Secondary | ICD-10-CM

## 2014-11-08 ENCOUNTER — Ambulatory Visit
Admission: RE | Admit: 2014-11-08 | Discharge: 2014-11-08 | Disposition: A | Discharge status: Home / Self Care | Payer: Medicare Other | Source: Ambulatory Visit | Attending: Family Medicine | Admitting: Family Medicine

## 2014-11-08 DIAGNOSIS — Z87891 Personal history of nicotine dependence: Secondary | ICD-10-CM

## 2014-11-08 DIAGNOSIS — Z136 Encounter for screening for cardiovascular disorders: Secondary | ICD-10-CM

## 2015-04-01 ENCOUNTER — Telehealth: Payer: Self-pay | Admitting: *Deleted

## 2015-04-01 NOTE — Telephone Encounter (Signed)
LMVM for pt that can be seen sooner with Dr. Clydene Fake NP (CM/NP).  To call back.

## 2015-04-14 ENCOUNTER — Ambulatory Visit: Payer: Medicare Other | Admitting: Neurology

## 2015-04-15 ENCOUNTER — Ambulatory Visit: Payer: Self-pay | Admitting: Neurology

## 2015-04-23 ENCOUNTER — Other Ambulatory Visit: Payer: Self-pay

## 2015-04-23 MED ORDER — CLOPIDOGREL BISULFATE 75 MG PO TABS: ORAL_TABLET | ORAL | Status: DC

## 2015-05-22 DEATH — deceased

## 2015-05-23 ENCOUNTER — Other Ambulatory Visit: Payer: Self-pay | Admitting: *Deleted

## 2015-05-23 MED ORDER — CLOPIDOGREL BISULFATE 75 MG PO TABS: ORAL_TABLET | ORAL | Status: DC

## 2015-09-02 ENCOUNTER — Other Ambulatory Visit: Payer: Self-pay | Admitting: Cardiovascular Disease

## 2015-09-08 ENCOUNTER — Encounter: Payer: Self-pay | Admitting: Cardiovascular Disease

## 2015-09-08 ENCOUNTER — Ambulatory Visit (INDEPENDENT_AMBULATORY_CARE_PROVIDER_SITE_OTHER): Payer: Medicare Other | Admitting: Cardiovascular Disease

## 2015-09-08 VITALS — BP 126/76 | HR 49 | Ht 68.0 in | Wt 187.0 lb

## 2015-09-08 DIAGNOSIS — I2510 Atherosclerotic heart disease of native coronary artery without angina pectoris: Secondary | ICD-10-CM | POA: Diagnosis not present

## 2015-09-08 MED ORDER — METOPROLOL TARTRATE 25 MG PO TABS: 25.0000 mg | ORAL_TABLET | Freq: Two times a day (BID) | ORAL | Status: DC

## 2015-09-08 NOTE — Progress Notes (Signed)
Marvin Salinas Date of Birth  1945/10/14       [facility] [address], [place], [place]  [address]   [place], [place]  [address] [phone]     [phone]   Fax  [phone]    Fax [phone]  Problem List: 1. Coronary artery disease-status post CABG-2001 2. Hypertension 3. Hyperlipidemia 4. Diabetes mellitus 5. Extensive cerebrovascular disease with prior TIAs and known basilar artery stenosis  History of Present Illness: Marvin Salinas is seen back today for a 3 month check.  He is a former patient of Dr. Susa Simmonds. He has known CAD with prior CABG back in 2011. Other issues include HTN, HLD, DM and extensive cerebral vascular disease with prior TIA's and known basilar artery stenosis. He is on chronic Plavix and aspirin therapy. Was on coumadin in the remote past.  Pt has done well. Complains of back pain.  He gets some regular exercise - walks on occasion.  September 08, 2015: Marvin Salinas is seen back after a 3-4 year absence. [facility] patient of [facility].  CAD - CABG in 2011,   Cerebral vascular disease   Doing well,  No CP , no further TIAs  HR is slow,  No syncope or presyncope.   HR has been slow for a while  Exercises,  Still paints houses.   Current Outpatient Prescriptions on File Prior to Visit  Medication Sig Dispense Refill  . aspirin 81 MG tablet Take 81 mg by mouth daily.      . clopidogrel (PLAVIX) 75 MG tablet TAKE 1 TABLET BY MOUTH EVERY DAY 30 tablet 3  . Hypertonic Nasal Wash (SINUS RINSE BOTTLE KIT NA) Place into the nose 2 (two) times daily.    . insulin glargine (LANTUS) 100 UNIT/ML injection Inject 35 Units into the skin 2 (two) times daily. 30 units am and 30 units pm    . loratadine (CLARITIN) 10 MG tablet Take 10 mg by mouth daily.    . metFORMIN (GLUCOPHAGE) 1000 MG tablet Take 1,000 mg by mouth 2 (two) times daily with a meal.      . metoprolol (LOPRESSOR) 50 MG tablet TAKE 1 AND 1/2  TABLETS BY MOUTH TWICE DAILY 270 tablet 3  . nitroGLYCERIN (NITROSTAT) 0.4 MG SL tablet Place 1 tablet (0.4 mg total) under the tongue every 5 (five) minutes as needed. 25 tablet 11  . olmesartan-hydrochlorothiazide (BENICAR HCT) 20-12.5 MG tablet TAKE 1 TABLET BY MOUTH EVERY DAY 90 tablet 0  . Omega-3 Fatty Acids (FISH OIL) 1000 MG CAPS Take 1,000 mg by mouth 2 (two) times daily.    . TRICOR 145 MG tablet TAKE 1 TABLET BY MOUTH EVERY DAY 90 tablet 1   No current facility-administered medications on file prior to visit.   metoprolol 50 mg twice a day  Allergies  Allergen Reactions  . Codeine Nausea And Vomiting  . Lisinopril Cough  . Latex Hives    Past Medical History  Diagnosis Date  . Hypertension   . Hyperlipidemia   . Diabetes mellitus     on insulin  . Ischemic heart disease     prior PCI to RCA in 1989. S/P PCI to LAD and OM in 1992. S/P PCI to first DX in 2000. S/P CABG x 3 in May 2011  . Depression   . Sleep apnea   . Peripheral neuropathy ([place])   . History of renal calculi   .  Back pain   . S/P CABG x 21 Oct 2009  . Cerebral vascular disease     with prior TIA's; followed by Dr. Erling Cruz  . Basilar artery stenosis     on chronic Plavix  . Pneumonia Sept 2012    Past Surgical History  Procedure Laterality Date  . Coronary artery bypass graft  11/12/2009    LIMA to LAD, SVG to OM and SVG to RCA  . Angioplasty  1989    right coronary artery  . Angioplasty  1992    LAD and OM  . Angioplasty  1998    First DX  . Coronary stent placement  2000    Stent to LAD/Circumflex with angioplasty to first diagonal     History  Smoking status  . Former Smoker -- 1.00 packs/day for 54 years  . Types: Cigarettes  . Quit date: 10/19/2009  Smokeless tobacco  . Never Used    History  Alcohol Use No    Family History  Problem Relation Age of Onset  . Hypertension Father   . Heart disease Father   . Asthma Brother   . Stroke      Uncle  . Heart attack Father      Reviw of Systems:  Reviewed in the HPI.  All other systems are negative.  Physical Exam: Blood pressure 126/76, pulse 49, height _0  (1.727 m), weight 187 lb (84.823 kg). General: Well developed, well nourished, in no acute distress.  Head: Normocephalic, atraumatic, sclera non-icteric, mucus membranes are moist,  Neck: Supple. Carotids are 2 + without bruits. No JVD Lungs: Clear bilaterally to auscultation. Heart: regular rate.  normal  S1 S2. No murmurs, gallops or rubs. Occasional PVCs Abdomen: Soft, non-tender, non-distended with normal bowel sounds. No hepatomegaly. No rebound/guarding. No masses. Msk:  Strength and tone are normal Extremities: No clubbing or cyanosis. No edema.  Distal pedal pulses are 2+ and equal bilaterally. Neuro: Alert and oriented X 3. Moves all extremities spontaneously. Psych:  Responds to questions appropriately with a normal affect.  ECG: Sinus brady with rare PVCs.  H R 49.  No ST or T wave changes.   Assessment / Plan:   1. Coronary artery disease-status post CABG-2011 2. Hypertension - doing well.   He is bradycardic so we will reduce his metoprolol to 25 BID  3. Hyperlipidemia -  Labs drawn at his primary MD.   Will ask her to send labs over.  4. Diabetes mellitus - managed by his primary MD  5. Extensive cerebrovascular disease with prior TIAs and known basilar artery stenosis     Verda Mehta, Wonda Cheng, MD  09/08/2015 8:48 AM    [facility],  [place], [place]  [address] Pager [phone] Phone: [phone]; Fax: [phone]   [facility]  [address] [place], [place]  [address] [phone]   Fax [phone]

## 2015-09-08 NOTE — Patient Instructions (Signed)
Medication Instructions:  DECREASE Metoprolol to 25 mg twice daily   Labwork: None Ordered   Testing/Procedures: None Ordered   Follow-Up: Your physician wants you to follow-up in: 1 year with Dr. Acie Fredrickson.  You will receive a reminder letter in the mail two months in advance. If you don't receive a letter, please call our office to schedule the follow-up appointment.   If you need a refill on your cardiac medications before your next appointment, please call your pharmacy.   Thank you for choosing [facility]! Christen Bame, RN [phone]

## 2015-09-16 ENCOUNTER — Other Ambulatory Visit: Payer: Self-pay | Admitting: Cardiovascular Disease

## 2015-09-18 ENCOUNTER — Other Ambulatory Visit: Payer: Self-pay | Admitting: Nurse Practitioner

## 2015-09-23 ENCOUNTER — Other Ambulatory Visit: Payer: Self-pay | Admitting: Cardiology

## 2015-09-23 DIAGNOSIS — I6523 Occlusion and stenosis of bilateral carotid arteries: Secondary | ICD-10-CM

## 2015-10-07 ENCOUNTER — Ambulatory Visit (HOSPITAL_COMMUNITY)
Admission: RE | Admit: 2015-10-07 | Discharge: 2015-10-07 | Disposition: A | Discharge status: Home / Self Care | Payer: Medicare Other | Source: Ambulatory Visit | Attending: Cardiology | Admitting: Cardiology

## 2015-10-07 DIAGNOSIS — E785 Hyperlipidemia, unspecified: Secondary | ICD-10-CM | POA: Diagnosis not present

## 2015-10-07 DIAGNOSIS — I6523 Occlusion and stenosis of bilateral carotid arteries: Secondary | ICD-10-CM | POA: Diagnosis not present

## 2015-10-07 DIAGNOSIS — E119 Type 2 diabetes mellitus without complications: Secondary | ICD-10-CM | POA: Insufficient documentation

## 2015-10-07 DIAGNOSIS — I119 Hypertensive heart disease without heart failure: Secondary | ICD-10-CM | POA: Diagnosis not present

## 2015-11-29 ENCOUNTER — Other Ambulatory Visit: Payer: Self-pay | Admitting: Cardiovascular Disease

## 2016-01-14 ENCOUNTER — Other Ambulatory Visit: Payer: Self-pay | Admitting: Cardiovascular Disease

## 2016-08-13 ENCOUNTER — Other Ambulatory Visit: Payer: Self-pay | Admitting: Cardiovascular Disease

## 2016-08-20 ENCOUNTER — Encounter: Payer: Self-pay | Admitting: Cardiovascular Disease

## 2016-08-21 ENCOUNTER — Other Ambulatory Visit: Payer: Self-pay | Admitting: Cardiovascular Disease

## 2016-08-31 ENCOUNTER — Ambulatory Visit: Payer: Medicare Other | Admitting: Cardiovascular Disease

## 2016-09-09 ENCOUNTER — Other Ambulatory Visit: Payer: Self-pay | Admitting: Cardiovascular Disease

## 2016-10-20 ENCOUNTER — Ambulatory Visit (INDEPENDENT_AMBULATORY_CARE_PROVIDER_SITE_OTHER): Payer: Medicare Other | Admitting: Cardiovascular Disease

## 2016-10-20 ENCOUNTER — Encounter (INDEPENDENT_AMBULATORY_CARE_PROVIDER_SITE_OTHER): Payer: Self-pay

## 2016-10-20 ENCOUNTER — Encounter: Payer: Self-pay | Admitting: Cardiovascular Disease

## 2016-10-20 VITALS — BP 132/60 | HR 50 | Ht 68.0 in | Wt 187.1 lb

## 2016-10-20 DIAGNOSIS — I739 Peripheral vascular disease, unspecified: Secondary | ICD-10-CM | POA: Diagnosis not present

## 2016-10-20 DIAGNOSIS — I2510 Atherosclerotic heart disease of native coronary artery without angina pectoris: Secondary | ICD-10-CM | POA: Diagnosis not present

## 2016-10-20 DIAGNOSIS — I10 Essential (primary) hypertension: Secondary | ICD-10-CM

## 2016-10-20 DIAGNOSIS — E782 Mixed hyperlipidemia: Secondary | ICD-10-CM

## 2016-10-20 NOTE — Patient Instructions (Signed)
Medication Instructions:  Your physician recommends that you continue on your current medications as directed. Please refer to the Current Medication list given to you today.   Labwork: None Ordered   Testing/Procedures: Your physician has requested that you have a lower extremity arterial duplex. This test is an ultrasound of the arteries in the legs or arms. It looks at arterial blood flow in the legs and arms. Allow one hour for Lower and Upper Arterial scans. There are no restrictions or special instructions   Follow-Up: Your physician wants you to follow-up in: 1 year with Dr. Acie Fredrickson.  You will receive a reminder letter in the mail two months in advance. If you don't receive a letter, please call our office to schedule the follow-up appointment.   If you need a refill on your cardiac medications before your next appointment, please call your pharmacy.   Thank you for choosing [facility]! Christen Bame, RN [phone]

## 2016-10-20 NOTE — Progress Notes (Signed)
  Marvin Salinas Date of Birth  05/14/1946       [facility]    [facility] [address], [address]  [address] [place], [place]  [address]   [place], [place]  [address] [phone]     [phone]   Fax  [phone]    Fax [phone]  Problem List: 1. Coronary artery disease-status post CABG-2001 2. Hypertension 3. Hyperlipidemia 4. Diabetes mellitus 5. Extensive cerebrovascular disease with prior TIAs and known basilar artery stenosis  History of Present Illness: Marvin Salinas is seen back today for a 3 month check.  He is a former patient of Dr. Tennant's. He has known CAD with prior CABG back in 2011. Other issues include HTN, HLD, DM and extensive cerebral vascular disease with prior TIA's and known basilar artery stenosis. He is on chronic Plavix and aspirin therapy. Was on coumadin in the remote past.  Pt has done well. Complains of back pain.  He gets some regular exercise - walks on occasion.  September 08, 2015: Mr. Steffler is seen back after a 3-4 year absence. Former patient of [facility].  CAD - CABG in 2011,   Cerebral vascular disease   Doing well,  No CP , no further TIAs  HR is slow,  No syncope or presyncope.   HR has been slow for a while  Exercises,  Still paints houses.   Oct 20, 2016:  Marvin Salinas is doing well from a cardiac standpont. No CP or dyspnea Has leg cramp / burning with exercise Is on pravachol for hyperlipidemia,  Has not tolerated the other statins   Current Outpatient Prescriptions on File Prior to Visit  Medication Sig Dispense Refill  . aspirin 81 MG tablet Take 81 mg by mouth daily.      . clopidogrel (PLAVIX) 75 MG tablet TAKE 1 TABLET BY MOUTH EVERY DAY 30 tablet 1  . Hypertonic Nasal Wash (SINUS RINSE BOTTLE KIT NA) Place into the nose 2 (two) times daily.    . insulin glargine (LANTUS) 100 UNIT/ML injection Inject 35 Units into the skin 2 (two) times daily.     . loratadine (CLARITIN) 10 MG tablet Take 10 mg by  mouth daily.    . metFORMIN (GLUCOPHAGE) 1000 MG tablet Take 1,000 mg by mouth 2 (two) times daily with a meal.      . metoprolol tartrate (LOPRESSOR) 25 MG tablet TAKE 1 TABLET (25 MG TOTAL) BY MOUTH 2 (TWO) TIMES DAILY. 180 tablet 0  . olmesartan-hydrochlorothiazide (BENICAR HCT) 20-12.5 MG tablet TAKE 1 TABLET BY MOUTH EVERY DAY 90 tablet 3  . Omega-3 Fatty Acids (FISH OIL) 1000 MG CAPS Take 1,000 mg by mouth 2 (two) times daily.    . TRICOR 145 MG tablet TAKE 1 TABLET BY MOUTH EVERY DAY 90 tablet 1   No current facility-administered medications on file prior to visit.    metoprolol 50 mg twice a day  Allergies  Allergen Reactions  . Codeine Nausea And Vomiting  . Lisinopril Cough    Other reaction(s): Cough (ALLERGY/intolerance)  . Nsaids     Other reaction(s): Other (See Comments) CKD Other reaction(s): Other CKD  . Latex Hives    Past Medical History:  Diagnosis Date  . Back pain   . Basilar artery stenosis    on chronic Plavix  . Cerebral vascular disease    with prior TIA's; followed by Dr. Love  . Depression   . Diabetes mellitus    on insulin  .   History of renal calculi   . Hyperlipidemia   . Hypertension   . Ischemic heart disease    prior PCI to RCA in 1989. S/P PCI to LAD and OM in 1992. S/P PCI to first DX in 2000. S/P CABG x 3 in May 2011  . Peripheral neuropathy   . Pneumonia Sept 2012  . S/P CABG x 21 Oct 2009  . Sleep apnea     Past Surgical History:  Procedure Laterality Date  . ANGIOPLASTY  1989   right coronary artery  . ANGIOPLASTY  1992   LAD and OM  . ANGIOPLASTY  1998   First DX  . CORONARY ARTERY BYPASS GRAFT  11/12/2009   LIMA to LAD, SVG to OM and SVG to RCA  . CORONARY STENT PLACEMENT  2000   Stent to LAD/Circumflex with angioplasty to first diagonal     History  Smoking Status  . Former Smoker  . Packs/day: 1.00  . Years: 54.00  . Types: Cigarettes  . Quit date: 10/19/2009  Smokeless Tobacco  . Never Used    History    Alcohol Use No    Family History  Problem Relation Age of Onset  . Hypertension Father   . Heart disease Father   . Heart attack Father   . Asthma Brother   . Stroke      Uncle    Reviw of Systems:  Reviewed in the HPI.  All other systems are negative.  Physical Exam: Blood pressure 132/60, pulse (!) 50, height 5' 8" (1.727 m), weight 187 lb 1.9 oz (84.9 kg), SpO2 94 %. General: Well developed, well nourished, in no acute distress.  Head: Normocephalic, atraumatic, sclera non-icteric, mucus membranes are moist,  Neck: Supple. Carotids are 2 + without bruits. No JVD Lungs: Clear bilaterally to auscultation. Heart: regular rate.  normal  S1 S2. No murmurs, gallops or rubs. Occasional PVCs Abdomen: Soft, non-tender, non-distended with normal bowel sounds. No hepatomegaly. No rebound/guarding. No masses. Msk:  Strength and tone are normal Extremities: No clubbing or cyanosis. No edema.  Distal pedal pulses are not felt  well   Neuro: Alert and oriented X 3. Moves all extremities spontaneously. Psych:  Responds to questions appropriately with a normal affect.  ECG: Sinus brady   At 50 .   Marland Kitchen  No ST or T wave changes.   Assessment / Plan:   1. Coronary artery disease-status post CABG-2011, No angina   2. Hypertension - doing well.   He is bradycardic  3. Hyperlipidemia -  Labs drawn at his primary MD.   Will ask him  to send labs over.  4. Diabetes mellitus - managed by his primary MD  5. Claudication:   Has burning in his legs with exercise .  Pulses are poor.  Will get arterial duplex scan of legs     Mertie Moores, MD  10/20/2016 8:13 AM    [facility] [place],  [place], [place]  [address] Pager [phone] Phone: [phone]; Fax: [phone]

## 2016-10-27 ENCOUNTER — Other Ambulatory Visit: Payer: Self-pay | Admitting: Cardiovascular Disease

## 2016-11-01 ENCOUNTER — Other Ambulatory Visit: Payer: Self-pay | Admitting: Cardiovascular Disease

## 2016-11-01 MED ORDER — CLOPIDOGREL BISULFATE 75 MG PO TABS: 75.0000 mg | ORAL_TABLET | Freq: Every day | ORAL | 3 refills | Status: DC

## 2016-11-03 ENCOUNTER — Ambulatory Visit (HOSPITAL_COMMUNITY)
Admission: RE | Admit: 2016-11-03 | Discharge: 2016-11-03 | Disposition: A | Discharge status: Home / Self Care | Payer: Medicare Other | Source: Ambulatory Visit | Attending: Cardiovascular Disease | Admitting: Cardiovascular Disease

## 2016-11-03 ENCOUNTER — Other Ambulatory Visit: Payer: Self-pay | Admitting: Cardiovascular Disease

## 2016-11-03 DIAGNOSIS — I739 Peripheral vascular disease, unspecified: Secondary | ICD-10-CM

## 2016-11-03 DIAGNOSIS — R0989 Other specified symptoms and signs involving the circulatory and respiratory systems: Secondary | ICD-10-CM

## 2016-11-24 ENCOUNTER — Other Ambulatory Visit: Payer: Self-pay | Admitting: Cardiovascular Disease

## 2016-11-30 ENCOUNTER — Encounter: Payer: Self-pay | Admitting: Cardiovascular Disease

## 2016-11-30 ENCOUNTER — Ambulatory Visit (INDEPENDENT_AMBULATORY_CARE_PROVIDER_SITE_OTHER): Payer: Medicare Other | Admitting: Cardiovascular Disease

## 2016-11-30 ENCOUNTER — Encounter (INDEPENDENT_AMBULATORY_CARE_PROVIDER_SITE_OTHER): Payer: Self-pay

## 2016-11-30 VITALS — BP 110/62 | HR 50

## 2016-11-30 DIAGNOSIS — I2510 Atherosclerotic heart disease of native coronary artery without angina pectoris: Secondary | ICD-10-CM | POA: Diagnosis not present

## 2016-11-30 DIAGNOSIS — I739 Peripheral vascular disease, unspecified: Secondary | ICD-10-CM | POA: Diagnosis not present

## 2016-11-30 DIAGNOSIS — Z720 Tobacco use: Secondary | ICD-10-CM | POA: Diagnosis not present

## 2016-11-30 NOTE — Progress Notes (Signed)
Cardiology Office Note   Date:  11/30/2016   ID:  Marvin Salinas, DOB November 24, 1945, MRN [id number]  PCP:  Aletha Halim., PA-C  Cardiologist:  Dr. Acie Fredrickson  No chief complaint on file.     History of Present Illness: Marvin Salinas is a 71 y.o. male who was referred by Dr. Acie Fredrickson for evaluation of peripheral arterial disease.  He has known history of coronary artery disease status post CABG in 2001, essential hypertension, hyperlipidemia, diabetes and prior TIAs. He recently complained of claudication and thus he was referred for lower extremity arterial Doppler which showed mildly reduced ABI on the left side at 0.83. Duplex showed significant bilateral iliac disease worse at the left external iliac artery with a peak velocity of 402. There was moderate SFA/popliteal disease with possible significant stenosis affecting the distal left SFA. He reports bilateral leg pain, heaviness and numbness that most noticeable at the right hip. He also complains of left calf discomfort. He feels that the right leg is worse than the left leg. He has history of chronic lower back pain but this has not been evaluated recently. He does have underlying chronic kidney disease with most recent creatinine 1.86. He is a smoker and currently smokes 4-5 cigarettes daily. He has family history of coronary artery disease.   Past Medical History:  Diagnosis Date  . Back pain   . Basilar artery stenosis    on chronic Plavix  . Cerebral vascular disease    with prior TIA's; followed by Dr. Erling Cruz  . Depression   . Diabetes mellitus    on insulin  . History of renal calculi   . Hyperlipidemia   . Hypertension   . Ischemic heart disease    prior PCI to RCA in 1989. S/P PCI to LAD and OM in 1992. S/P PCI to first DX in 2000. S/P CABG x 3 in May 2011  . Peripheral neuropathy   . Pneumonia Sept 2012  . S/P CABG x 21 Oct 2009  . Sleep apnea     Past Surgical History:  Procedure Laterality Date  .  ANGIOPLASTY  1989   right coronary artery  . ANGIOPLASTY  1992   LAD and OM  . ANGIOPLASTY  1998   First DX  . CORONARY ARTERY BYPASS GRAFT  11/12/2009   LIMA to LAD, SVG to OM and SVG to RCA  . CORONARY STENT PLACEMENT  2000   Stent to LAD/Circumflex with angioplasty to first diagonal      Current Outpatient Prescriptions  Medication Sig Dispense Refill  . aspirin 81 MG tablet Take 81 mg by mouth daily.      . clopidogrel (PLAVIX) 75 MG tablet Take 1 tablet (75 mg total) by mouth daily. 90 tablet 3  . Hypertonic Nasal Wash (SINUS RINSE BOTTLE KIT NA) Place into the nose 2 (two) times daily.    . insulin glargine (LANTUS) 100 UNIT/ML injection Inject 35 Units into the skin 2 (two) times daily.     Marland Kitchen loratadine (CLARITIN) 10 MG tablet Take 10 mg by mouth daily.    . metFORMIN (GLUCOPHAGE) 1000 MG tablet Take 1,000 mg by mouth 2 (two) times daily with a meal.      . metoprolol tartrate (LOPRESSOR) 25 MG tablet TAKE 1 TABLET (25 MG TOTAL) BY MOUTH 2 (TWO) TIMES DAILY. 180 tablet 3  . nitroGLYCERIN (NITROSTAT) 0.4 MG SL tablet Place 0.4 mg under the tongue every 5 (five) minutes as needed for  chest pain.    Marland Kitchen olmesartan-hydrochlorothiazide (BENICAR HCT) 20-12.5 MG tablet TAKE 1 TABLET BY MOUTH EVERY DAY 90 tablet 3  . Omega-3 Fatty Acids (FISH OIL) 1000 MG CAPS Take 1,000 mg by mouth 2 (two) times daily.    . TRICOR 145 MG tablet TAKE 1 TABLET BY MOUTH EVERY DAY 90 tablet 1   No current facility-administered medications for this visit.     Allergies:   Codeine; Lisinopril; Nsaids; and Latex    Social History:  The patient  reports that he quit smoking about 7 years ago. His smoking use included Cigarettes. He has a 54.00 pack-year smoking history. He has never used smokeless tobacco. He reports that he does not drink alcohol or use drugs.   Family History:  The patient's family history includes Asthma in his brother; Heart attack in his father; Heart disease in his father; Hypertension  in his father.    ROS:  Please see the history of present illness.   Otherwise, review of systems are positive for none.   All other systems are reviewed and negative.    PHYSICAL EXAM: VS:  BP 110/62 (BP Location: Right Arm, Patient Position: Sitting, Cuff Size: Normal)   Pulse (!) 50  , BMI There is no height or weight on file to calculate BMI. GEN: Well nourished, well developed, in no acute distress  HEENT: normal  Neck: no JVD, carotid bruits, or masses Cardiac: RRR; no murmurs, rubs, or gallops,no edema  Respiratory:  clear to auscultation bilaterally, normal work of breathing GI: soft, nontender, nondistended, + BS MS: no deformity or atrophy  Skin: warm and dry, no rash Neuro:  Strength and sensation are intact Psych: euthymic mood, full affect  Vascular: Femoral pulses +1 bilaterally slightly stronger on the right than the left. Posterior tibial is weak and palpable on the right side but not the left side. Dorsalis pedis is not palpable.  EKG:  EKG is not ordered today.    Recent Labs: No results found for requested labs within last 8760 hours.    Lipid Panel No results found for: CHOL, TRIG, HDL, CHOLHDL, VLDL, LDLCALC, LDLDIRECT    Wt Readings from Last 3 Encounters:  10/20/16 187 lb 1.9 oz (84.9 kg)  09/08/15 187 lb (84.8 kg)  04/17/14 193 lb (87.5 kg)        PAD Screen 11/30/2016  Previous PAD dx? Yes  Previous surgical procedure? No  Pain with walking? Yes  Subsides with rest? No  Feet/toe relief with dangling? Yes  Painful, non-healing ulcers? No  Extremities discolored? No      ASSESSMENT AND PLAN:  1.  Peripheral arterial disease: The patient has bilateral leg claudication. Surprisingly, he complains of more discomfort affecting the right leg than the left leg in spite of the disease being worse on the left side. He also reports rest pain which cannot be explained by his peripheral arterial disease. I suspect that he has 2 separate issues  causing his discomfort. Most likely he has spine related problems especially leading to right hip discomfort. I discussed with him and his the natural history and management of claudication. Angiography would add additional information especially about his aortoiliac disease. However, he has underlying chronic kidney disease and he is at higher risk of contrast-induced nephropathy. Thus, I have recommended attempting a walking exercise program and this was explained to him. He will be a good candidate for rehabilitation once it is in approved indication. I also recommend consideration of referral to  a spine specialist for evaluation. I have asked him to discuss that with his primary care physician.  2. Coronary artery disease without angina: Continue medical therapy.  3. Tobacco use: I discussed with him the strong association between peripheral arterial disease and smoking. I strongly advised him to quit smoking.    Disposition:   FU with me in 3 months  Signed,  Kathlyn Sacramento, MD  11/30/2016 8:29 AM    [place]

## 2016-11-30 NOTE — Patient Instructions (Signed)
Medication Instructions:  Your physician recommends that you continue on your current medications as directed. Please refer to the Current Medication list given to you today.  Follow-Up: Your physician recommends that you schedule a follow-up appointment in: 3 MONTHS with Dr. Fletcher Anon.   Any Other Special Instructions Will Be Listed Below (If Applicable).     If you need a refill on your cardiac medications before your next appointment, please call your pharmacy.

## 2017-01-03 LAB — HEPATIC FUNCTION PANEL
ALT: 20 (ref 10–40)
AST: 27 (ref 14–40)

## 2017-01-03 LAB — BASIC METABOLIC PANEL
BUN: 28 — AB (ref 4–21)
BUN: 28 — AB (ref 4–21)
Creatinine: 1.8 — AB (ref 0.6–1.3)
Creatinine: 1.8 — AB (ref 0.6–1.3)
Potassium: 4.2 (ref 3.4–5.3)
Sodium: 142 (ref 137–147)

## 2017-01-03 LAB — LIPID PANEL
Cholesterol: 214 — AB (ref 0–200)
HDL: 43 (ref 35–70)
LDL Cholesterol: 134
Triglycerides: 298 — AB (ref 40–160)

## 2017-01-03 LAB — HEMOGLOBIN A1C: Hemoglobin A1C: 7.4

## 2017-03-01 ENCOUNTER — Ambulatory Visit (INDEPENDENT_AMBULATORY_CARE_PROVIDER_SITE_OTHER): Payer: Medicare Other | Admitting: Cardiovascular Disease

## 2017-03-01 ENCOUNTER — Encounter: Payer: Self-pay | Admitting: Cardiovascular Disease

## 2017-03-01 VITALS — BP 102/61 | HR 55 | Ht 68.0 in | Wt 184.8 lb

## 2017-03-01 DIAGNOSIS — E785 Hyperlipidemia, unspecified: Secondary | ICD-10-CM | POA: Diagnosis not present

## 2017-03-01 DIAGNOSIS — I6529 Occlusion and stenosis of unspecified carotid artery: Secondary | ICD-10-CM | POA: Diagnosis not present

## 2017-03-01 DIAGNOSIS — I739 Peripheral vascular disease, unspecified: Secondary | ICD-10-CM | POA: Diagnosis not present

## 2017-03-01 DIAGNOSIS — Z720 Tobacco use: Secondary | ICD-10-CM | POA: Diagnosis not present

## 2017-03-01 DIAGNOSIS — I2510 Atherosclerotic heart disease of native coronary artery without angina pectoris: Secondary | ICD-10-CM | POA: Diagnosis not present

## 2017-03-01 NOTE — Patient Instructions (Signed)
Medication Instructions: Your physician recommends that you continue on your current medications as directed. Please refer to the Current Medication list given to you today.  If you need a refill on your cardiac medications before your next appointment, please call your pharmacy.    Procedures/Testing: Your physician has requested that you have a carotid duplex. This test is an ultrasound of the carotid arteries in your neck. It looks at blood flow through these arteries that supply the brain with blood. Allow one hour for this exam. There are no restrictions or special instructions. This will be done at [place], [address].    Follow-Up: Your physician wants you to follow-up in: 6 months with Dr. Fletcher Anon. You will receive a reminder letter in the mail two months in advance. If you don't receive a letter, please call our office to schedule this follow-up appointment.   Thank you for choosing [facility] at [facility]!!

## 2017-03-01 NOTE — Progress Notes (Signed)
Cardiology Office Note   Date:  03/01/2017   ID:  Marvin Salinas, DOB [birth year], MRN [id number]  PCP:  Aletha Halim., PA-C  Cardiologist:  Dr. Acie Fredrickson  Chief Complaint  Patient presents with  . Follow-up    3 months;      History of Present Illness: Marvin Salinas is a 71 y.o. male who Is here today for a follow-up visit regarding peripheral arterial disease.  He has known history of coronary artery disease status post CABG in 2001, essential hypertension, carotid disease, hyperlipidemia, diabetes and prior TIAs. He recently complained of claudication and thus he was referred for lower extremity arterial Doppler which showed mildly reduced ABI on the left side at 0.83. Duplex showed significant bilateral iliac disease worse at the left external iliac artery with a peak velocity of 402. There was moderate SFA/popliteal disease with possible significant stenosis affecting the distal left SFA.He does have underlying chronic kidney disease with most recent creatinine 1.86.   I recommended attempted walking program and smoking cessation. His symptoms seem to be out of proportion to peripheral arterial disease. He reports no significant change in symptoms. He is trying to quit smoking completely. He still complains of bilateral leg pain in the calf and thigh area which is equal in both sides.  Past Medical History:  Diagnosis Date  . Back pain   . Basilar artery stenosis    on chronic Plavix  . Cerebral vascular disease    with prior TIA's; followed by Dr. Erling Cruz  . Depression   . Diabetes mellitus    on insulin  . History of renal calculi   . Hyperlipidemia   . Hypertension   . Ischemic heart disease    prior PCI to RCA in 1989. S/P PCI to LAD and OM in 1992. S/P PCI to first DX in 2000. S/P CABG x 3 in May 2011  . Peripheral neuropathy   . Pneumonia Sept 2012  . S/P CABG x 21 Oct 2009  . Sleep apnea     Past Surgical History:  Procedure Laterality Date  . ANGIOPLASTY   1989   right coronary artery  . ANGIOPLASTY  1992   LAD and OM  . ANGIOPLASTY  1998   First DX  . CORONARY ARTERY BYPASS GRAFT  11/12/2009   LIMA to LAD, SVG to OM and SVG to RCA  . CORONARY STENT PLACEMENT  2000   Stent to LAD/Circumflex with angioplasty to first diagonal      Current Outpatient Prescriptions  Medication Sig Dispense Refill  . aspirin 81 MG tablet Take 81 mg by mouth daily.      . clopidogrel (PLAVIX) 75 MG tablet Take 1 tablet (75 mg total) by mouth daily. 90 tablet 3  . Hypertonic Nasal Wash (SINUS RINSE BOTTLE KIT NA) Place into the nose 2 (two) times daily.    . insulin glargine (LANTUS) 100 UNIT/ML injection Inject 35 Units into the skin 2 (two) times daily.     Marland Kitchen loratadine (CLARITIN) 10 MG tablet Take 10 mg by mouth daily.    . metFORMIN (GLUCOPHAGE) 1000 MG tablet Take 1,000 mg by mouth 2 (two) times daily with a meal.      . metoprolol tartrate (LOPRESSOR) 25 MG tablet TAKE 1 TABLET (25 MG TOTAL) BY MOUTH 2 (TWO) TIMES DAILY. 180 tablet 3  . nitroGLYCERIN (NITROSTAT) 0.4 MG SL tablet Place 0.4 mg under the tongue every 5 (five) minutes as needed for chest pain.    Marland Kitchen  olmesartan-hydrochlorothiazide (BENICAR HCT) 20-12.5 MG tablet TAKE 1 TABLET BY MOUTH EVERY DAY 90 tablet 3  . Omega-3 Fatty Acids (FISH OIL) 1000 MG CAPS Take 1,000 mg by mouth 2 (two) times daily.    . TRICOR 145 MG tablet TAKE 1 TABLET BY MOUTH EVERY DAY 90 tablet 1   No current facility-administered medications for this visit.     Allergies:   Codeine; Lisinopril; Nsaids; and Latex    Social History:  The patient  reports that he quit smoking about 7 years ago. His smoking use included Cigarettes. He has a 54.00 pack-year smoking history. He has never used smokeless tobacco. He reports that he does not drink alcohol or use drugs.   Family History:  The patient's family history includes Asthma in his brother; Heart attack in his father; Heart disease in his father; Hypertension in his  father; Stroke in his unknown relative.    ROS:  Please see the history of present illness.   Otherwise, review of systems are positive for none.   All other systems are reviewed and negative.    PHYSICAL EXAM: VS:  BP 102/61   Pulse (!) 55   Ht _0  (1.727 m)   Wt 184 lb 12.8 oz (83.8 kg)   BMI 28.10 kg/m  , BMI Body mass index is 28.1 kg/m. GEN: Well nourished, well developed, in no acute distress  HEENT: normal  Neck: no JVD,  or masses right carotid bruit.  Cardiac: RRR; no murmurs, rubs, or gallops,no edema  Respiratory:  clear to auscultation bilaterally, normal work of breathing GI: soft, nontender, nondistended, + BS MS: no deformity or atrophy  Skin: warm and dry, no rash Neuro:  Strength and sensation are intact Psych: euthymic mood, full affect  Vascular: Femoral pulses +1 bilaterally slightly stronger on the right than the left. Posterior tibial is weak and palpable on the right side but not the left side. Dorsalis pedis is not palpable.  EKG:  EKG is not ordered today.    Recent Labs: No results found for requested labs within last 8760 hours.    Lipid Panel No results found for: CHOL, TRIG, HDL, CHOLHDL, VLDL, LDLCALC, LDLDIRECT    Wt Readings from Last 3 Encounters:  03/01/17 184 lb 12.8 oz (83.8 kg)  10/20/16 187 lb 1.9 oz (84.9 kg)  09/08/15 187 lb (84.8 kg)        PAD Screen 11/30/2016  Previous PAD dx? Yes  Previous surgical procedure? No  Pain with walking? Yes  Subsides with rest? No  Feet/toe relief with dangling? Yes  Painful, non-healing ulcers? No  Extremities discolored? No      ASSESSMENT AND PLAN:  1.  Peripheral arterial disease: The patient has Moderate bilateral leg claudication.  No significant change since last visit. I again discussed with him the option of proceeding with angiography but he is very concerned about the possibility of contrast-induced nephropathy. I recommended that he continues with a walking program and  smoking cessation. If his symptoms become more significant, we can proceed with CO2 angiography.  2. Coronary artery disease without angina: Continue medical therapy.  3. Tobacco use: I discussed with him the strong association between peripheral arterial disease and smoking. I strongly advised him to quit smoking.  4. Carotid disease: He is known to have bilateral carotid disease worse on the left side. He is due for repeat carotid Doppler which was requested. .  5. Hyperlipidemia: Currently on pravastatin and TriCor.  Disposition:  FU with me in 6 months  Signed,  Kathlyn Sacramento, MD  03/01/2017 8:13 AM    [place]

## 2017-03-16 ENCOUNTER — Ambulatory Visit (HOSPITAL_COMMUNITY)
Admission: RE | Admit: 2017-03-16 | Discharge: 2017-03-16 | Disposition: A | Discharge status: Home / Self Care | Payer: Medicare Other | Source: Ambulatory Visit | Attending: Cardiology | Admitting: Cardiology

## 2017-03-16 DIAGNOSIS — E785 Hyperlipidemia, unspecified: Secondary | ICD-10-CM | POA: Insufficient documentation

## 2017-03-16 DIAGNOSIS — I10 Essential (primary) hypertension: Secondary | ICD-10-CM | POA: Insufficient documentation

## 2017-03-16 DIAGNOSIS — E119 Type 2 diabetes mellitus without complications: Secondary | ICD-10-CM | POA: Insufficient documentation

## 2017-03-16 DIAGNOSIS — Z87891 Personal history of nicotine dependence: Secondary | ICD-10-CM | POA: Diagnosis not present

## 2017-03-16 DIAGNOSIS — Z8673 Personal history of transient ischemic attack (TIA), and cerebral infarction without residual deficits: Secondary | ICD-10-CM | POA: Diagnosis not present

## 2017-03-16 DIAGNOSIS — I6529 Occlusion and stenosis of unspecified carotid artery: Secondary | ICD-10-CM

## 2017-03-16 DIAGNOSIS — Z951 Presence of aortocoronary bypass graft: Secondary | ICD-10-CM | POA: Diagnosis not present

## 2017-03-16 DIAGNOSIS — I6523 Occlusion and stenosis of bilateral carotid arteries: Secondary | ICD-10-CM | POA: Insufficient documentation

## 2017-03-16 DIAGNOSIS — I2510 Atherosclerotic heart disease of native coronary artery without angina pectoris: Secondary | ICD-10-CM | POA: Diagnosis not present

## 2017-03-25 ENCOUNTER — Encounter (HOSPITAL_COMMUNITY): Payer: Medicare Other

## 2017-04-14 ENCOUNTER — Other Ambulatory Visit: Payer: Self-pay | Admitting: *Deleted

## 2017-04-14 DIAGNOSIS — I739 Peripheral vascular disease, unspecified: Secondary | ICD-10-CM

## 2017-08-06 ENCOUNTER — Encounter: Payer: Self-pay | Admitting: Emergency Medicine

## 2017-08-06 ENCOUNTER — Emergency Department (INDEPENDENT_AMBULATORY_CARE_PROVIDER_SITE_OTHER)
Admission: EM | Admit: 2017-08-06 | Discharge: 2017-08-06 | Disposition: A | Discharge status: Home / Self Care | Payer: Medicare Other | Source: Home / Self Care | Attending: Family Medicine | Admitting: Family Medicine

## 2017-08-06 ENCOUNTER — Other Ambulatory Visit: Payer: Self-pay

## 2017-08-06 DIAGNOSIS — R112 Nausea with vomiting, unspecified: Secondary | ICD-10-CM

## 2017-08-06 DIAGNOSIS — R197 Diarrhea, unspecified: Secondary | ICD-10-CM

## 2017-08-06 MED ORDER — ONDANSETRON 4 MG PO TBDP
4.0000 mg | ORAL_TABLET | Freq: Once | ORAL | Status: AC
  Administered 2017-08-06: 4 mg via ORAL

## 2017-08-06 MED ORDER — ONDANSETRON 4 MG PO TBDP: ORAL_TABLET | ORAL | 0 refills | Status: DC

## 2017-08-06 NOTE — ED Provider Notes (Signed)
[facility]    CSN: [id number] Arrival date & time: 08/06/17  0919     History   Chief Complaint Chief Complaint  Patient presents with  . Diarrhea  . Emesis    HPI Marvin Salinas is a 72 y.o. male.   Four days ago patient developed nausea/vomiting, now decreased, and last night developed diarrhea and chills.  His partner has had similar symptoms.  No abdominal pain. Denies recent foreign travel, or drinking untreated water in a wilderness environment.  He denies recent antibiotic use.    The history is provided by the patient.  Emesis  Severity:  Mild Duration:  3 days Timing:  Intermittent Quality:  Stomach contents Able to tolerate:  Liquids Progression:  Resolved Chronicity:  New Recent urination:  Normal Relieved by:  Nothing Worsened by:  Nothing Ineffective treatments:  None tried Associated symptoms: diarrhea   Associated symptoms: no abdominal pain, no arthralgias, no chills, no cough, no fever, no headaches, no myalgias, no sore throat and no URI   Diarrhea:    Quality:  Watery   Severity:  Moderate   Duration:  1 day   Timing:  Intermittent   Progression:  Improving Risk factors: sick contacts   Risk factors: no suspect food intake and no travel to endemic areas     Past Medical History:  Diagnosis Date  . Back pain   . Basilar artery stenosis    on chronic Plavix  . Cerebral vascular disease    with prior TIA's; followed by Dr. Erling Cruz  . Depression   . Diabetes mellitus    on insulin  . History of renal calculi   . Hyperlipidemia   . Hypertension   . Ischemic heart disease    prior PCI to RCA in 1989. S/P PCI to LAD and OM in 1992. S/P PCI to first DX in 2000. S/P CABG x 3 in May 2011  . Peripheral neuropathy   . Pneumonia Sept 2012  . S/P CABG x 21 Oct 2009  . Sleep apnea     Patient Active Problem List   Diagnosis Date Noted  . CAD (coronary artery disease) 09/08/2015  . Benign prostatic hypertrophy without urinary  obstruction 04/17/2014  . Arteriosclerosis of coronary artery 04/17/2014  . Chronic kidney disease (CKD), stage III (moderate) ([place]) 04/17/2014  . Diabetes mellitus, type 2 ([place]) 04/17/2014  . Diabetic neuropathy ([place]) 04/17/2014  . Essential (primary) hypertension 04/17/2014  . History of stroke 04/17/2014  . Cough 12/06/2013  . Tobacco use disorder 12/06/2013  . Allergic rhinitis 12/06/2013  . Depression, neurotic 11/21/2013  . Hyperlipidemia 03/15/2011  . CAD (coronary artery disease) of artery bypass graft 10/06/2010  . Diabetes mellitus 10/06/2010  . Cerebral vascular disease 10/06/2010  . S/P CABG x 3 10/06/2010  . HTN (hypertension) 10/06/2010  . Claudication ([place]) 10/06/2010  . Spinal stenosis 10/06/2010  . Enthesopathy of ankle and tarsus 09/04/2007    Past Surgical History:  Procedure Laterality Date  . ANGIOPLASTY  1989   right coronary artery  . ANGIOPLASTY  1992   LAD and OM  . ANGIOPLASTY  1998   First DX  . CORONARY ARTERY BYPASS GRAFT  11/12/2009   LIMA to LAD, SVG to OM and SVG to RCA  . CORONARY STENT PLACEMENT  2000   Stent to LAD/Circumflex with angioplasty to first diagonal        Home Medications    Prior to Admission medications   Medication Sig Start Date  End Date Taking? Authorizing Provider  aspirin 81 MG tablet Take 81 mg by mouth daily.      [provider]  clopidogrel (PLAVIX) 75 MG tablet Take 1 tablet (75 mg total) by mouth daily. 11/01/16   [name], Wonda Cheng, MD  Hypertonic Nasal Wash (SINUS RINSE BOTTLE KIT NA) Place into the nose 2 (two) times daily.    [provider]  insulin glargine (LANTUS) 100 UNIT/ML injection Inject 35 Units into the skin 2 (two) times daily.     [provider]  loratadine (CLARITIN) 10 MG tablet Take 10 mg by mouth daily.    [provider]  metFORMIN (GLUCOPHAGE) 1000 MG tablet Take 1,000 mg by mouth 2 (two) times daily with a meal.      [provider]    metoprolol tartrate (LOPRESSOR) 25 MG tablet TAKE 1 TABLET (25 MG TOTAL) BY MOUTH 2 (TWO) TIMES DAILY. 10/27/16   [name], Wonda Cheng, MD  nitroGLYCERIN (NITROSTAT) 0.4 MG SL tablet Place 0.4 mg under the tongue every 5 (five) minutes as needed for chest pain.    [provider]  olmesartan-hydrochlorothiazide (BENICAR HCT) 20-12.5 MG tablet TAKE 1 TABLET BY MOUTH EVERY DAY 11/24/16   [name], Wonda Cheng, MD  Omega-3 Fatty Acids (FISH OIL) 1000 MG CAPS Take 1,000 mg by mouth 2 (two) times daily.    [provider]  ondansetron (ZOFRAN ODT) 4 MG disintegrating tablet Take one tab by mouth Q6hr prn nausea.  Dissolve under tongue. 08/06/17   Kandra Nicolas, MD  pravastatin (PRAVACHOL) 80 MG tablet Take 80 mg by mouth daily.    [provider]  TRICOR 145 MG tablet TAKE 1 TABLET BY MOUTH EVERY DAY 02/04/11   Burtis Junes, NP    Family History Family History  Problem Relation Age of Onset  . Hypertension Father   . Heart disease Father   . Heart attack Father   . Asthma Brother   . Stroke Unknown        Uncle    Social History Social History   Tobacco Use  . Smoking status: Former Smoker    Packs/day: 1.00    Years: 54.00    Pack years: 54.00    Types: Cigarettes    Last attempt to quit: 10/19/2009    Years since quitting: 7.8  . Smokeless tobacco: Never Used  Substance Use Topics  . Alcohol use: No  . Drug use: No     Allergies   Codeine; Lisinopril; Nsaids; and Latex   Review of Systems Review of Systems  Constitutional: Negative for chills and fever.  HENT: Negative for sore throat.   Respiratory: Negative for cough.   Gastrointestinal: Positive for diarrhea and vomiting. Negative for abdominal pain.  Musculoskeletal: Negative for arthralgias and myalgias.  Neurological: Negative for headaches.  All other systems reviewed and are negative.    Physical Exam Triage Vital Signs ED Triage Vitals  Enc Vitals Group     BP 08/06/17 0943 115/66      Pulse Rate 08/06/17 0943 64     Resp 08/06/17 0943 18     Temp 08/06/17 0943 (!) 97.4 F (36.3 C)     Temp Source 08/06/17 0943 Oral     SpO2 08/06/17 0943 97 %     Weight 08/06/17 0944 180 lb (81.6 kg)     Height 08/06/17 0944 _0  (1.727 m)     Head Circumference --      Peak Flow --  Pain Score --      Pain Loc --      Pain Edu? --      Excl. in [place]? --    No data found.  Updated Vital Signs BP 115/66 (BP Location: Right Arm)   Pulse 64   Temp (!) 97.4 F (36.3 C) (Oral)   Resp 18   Ht _0  (1.727 m)   Wt 180 lb (81.6 kg)   SpO2 97%   BMI 27.37 kg/m   Visual Acuity Right Eye Distance:   Left Eye Distance:   Bilateral Distance:    Right Eye Near:   Left Eye Near:    Bilateral Near:     Physical Exam Nursing notes and Vital Signs reviewed. Appearance:  Patient appears stated age, and in no acute distress.    Eyes:  Pupils are equal, round, and reactive to light and accomodation.  Extraocular movement is intact.  Conjunctivae are not inflamed   Pharynx:  Normal; moist mucous membranes  Neck:  Supple.  No adenopathy Lungs:  Clear to auscultation.  Breath sounds are equal.  Moving air well. Heart:  Regular rate and rhythm without murmurs, rubs, or gallops.  Abdomen:  Nontender without masses or hepatosplenomegaly.  Bowel sounds are present.  No CVA or flank tenderness.  Extremities:  No edema.  Skin:  No rash present.     UC Treatments / Results  Labs (all labs ordered are listed, but only abnormal results are displayed) Labs Reviewed - No data to display  EKG  EKG Interpretation None       Radiology No results found.  Procedures Procedures (including critical care time)  Medications Ordered in UC Medications  ondansetron (ZOFRAN-ODT) disintegrating tablet 4 mg (4 mg Oral Given 08/06/17 0930)     Initial Impression / Assessment and Plan / UC Course  I have reviewed the triage vital signs and the nursing notes.  Pertinent labs & imaging  results that were available during my care of the patient were reviewed by me and considered in my medical decision making (see chart for details).    Suspect viral gastroenteritis. Administered Zofran ODT 31m PO; given Rx for same. Begin clear liquids (Pedialyte while having diarrhea) until improved, then advance to a [employer](Bananas, Rice, Applesauce, Toast).  Then gradually resume a regular diet when tolerated.  Avoid milk products until well.  When stools become more formed, may take Imodium (loperamide) once or twice daily to decrease stool frequency.  If symptoms become significantly worse during the night or over the weekend, proceed to the local emergency room.  Followup with Family Doctor if not improved in 3 to 4 days.    Final Clinical Impressions(s) / UC Diagnoses   Final diagnoses:  Nausea vomiting and diarrhea    ED Discharge Orders        Ordered    ondansetron (ZOFRAN ODT) 4 MG disintegrating tablet     08/06/17 1043          BKandra Nicolas MD 08/12/17 2315

## 2017-08-06 NOTE — ED Triage Notes (Signed)
Has had GI distress for past 3 days; started with nausea and vomiting; now diarrhea throughout last night; no known fever; partner had same but now clear.

## 2017-08-06 NOTE — Discharge Instructions (Signed)
Begin clear liquids (Pedialyte while having diarrhea) until improved, then advance to a BRAT diet (Bananas, Rice, Applesauce, Toast).  Then gradually resume a regular diet when tolerated.  Avoid milk products until well.  When stools become more formed, may take Imodium (loperamide) once or twice daily to decrease stool frequency.  °If symptoms become significantly worse during the night or over the weekend, proceed to the local emergency room.  °

## 2017-08-08 ENCOUNTER — Telehealth: Payer: Self-pay | Admitting: Emergency Medicine

## 2017-08-30 ENCOUNTER — Encounter: Payer: Self-pay | Admitting: Cardiovascular Disease

## 2017-08-30 ENCOUNTER — Ambulatory Visit: Payer: Medicare Other | Admitting: Cardiovascular Disease

## 2017-08-30 VITALS — BP 118/64 | HR 55 | Ht 68.0 in | Wt 187.6 lb

## 2017-08-30 DIAGNOSIS — Z720 Tobacco use: Secondary | ICD-10-CM | POA: Diagnosis not present

## 2017-08-30 DIAGNOSIS — I2510 Atherosclerotic heart disease of native coronary artery without angina pectoris: Secondary | ICD-10-CM | POA: Diagnosis not present

## 2017-08-30 DIAGNOSIS — I739 Peripheral vascular disease, unspecified: Secondary | ICD-10-CM | POA: Diagnosis not present

## 2017-08-30 DIAGNOSIS — I779 Disorder of arteries and arterioles, unspecified: Secondary | ICD-10-CM

## 2017-08-30 DIAGNOSIS — I10 Essential (primary) hypertension: Secondary | ICD-10-CM

## 2017-08-30 DIAGNOSIS — E785 Hyperlipidemia, unspecified: Secondary | ICD-10-CM

## 2017-08-30 NOTE — Progress Notes (Signed)
Cardiology Office Note   Date:  08/30/2017   ID:  Marvin Salinas, DOB 26-Sep-1945, MRN [id number]  PCP:  Marvin Halim., PA-C  Cardiologist:  Dr. Acie Salinas  No chief complaint on file.     History of Present Illness: ASHAUN Salinas is a 73 y.o. male who is here today for a follow-up visit regarding peripheral arterial disease.  He has known history of coronary artery disease status post CABG in 2001, essential hypertension, carotid disease, hyperlipidemia, diabetes and prior TIAs. He is being followed for moderate atypical leg claudication.   Vascular studies in 2018  showed mildly reduced ABI on the left side at 0.83. Duplex showed significant bilateral iliac disease worse at the left external iliac artery with a peak velocity of 402. There was moderate SFA/popliteal disease with possible significant stenosis affecting the distal left SFA.He does have underlying chronic kidney disease with creatinine between 1.6-2.  I recommended attempted walking program and smoking cessation.  His symptoms remain stable.  Interestingly, he complains mostly of exertional right calf pain in spite of the disease being worse on the left side.  He denies chest pain or shortness of breath.  Past Medical History:  Diagnosis Date  . Back pain   . Basilar artery stenosis    on chronic Plavix  . Cerebral vascular disease    with prior TIA's; followed by Dr. Erling Salinas  . Depression   . Diabetes mellitus    on insulin  . History of renal calculi   . Hyperlipidemia   . Hypertension   . Ischemic heart disease    prior PCI to RCA in 1989. S/P PCI to LAD and OM in 1992. S/P PCI to first DX in 2000. S/P CABG x 3 in May 2011  . Peripheral neuropathy   . Pneumonia Sept 2012  . S/P CABG x 21 Oct 2009  . Sleep apnea     Past Surgical History:  Procedure Laterality Date  . ANGIOPLASTY  1989   right coronary artery  . ANGIOPLASTY  1992   LAD and OM  . ANGIOPLASTY  1998   First DX  . CORONARY ARTERY BYPASS  GRAFT  11/12/2009   LIMA to LAD, SVG to OM and SVG to RCA  . CORONARY STENT PLACEMENT  2000   Stent to LAD/Circumflex with angioplasty to first diagonal      Current Outpatient Medications  Medication Sig Dispense Refill  . aspirin 81 MG tablet Take 81 mg by mouth daily.      . clopidogrel (PLAVIX) 75 MG tablet Take 1 tablet (75 mg total) by mouth daily. 90 tablet 3  . Hypertonic Nasal Wash (SINUS RINSE BOTTLE KIT NA) Place into the nose 2 (two) times daily.    . insulin glargine (LANTUS) 100 UNIT/ML injection Inject 35 Units into the skin 2 (two) times daily.     Marland Kitchen loratadine (CLARITIN) 10 MG tablet Take 10 mg by mouth daily.    . metFORMIN (GLUCOPHAGE) 1000 MG tablet Take 1,000 mg by mouth 2 (two) times daily with a meal.      . metoprolol tartrate (LOPRESSOR) 25 MG tablet TAKE 1 TABLET (25 MG TOTAL) BY MOUTH 2 (TWO) TIMES DAILY. 180 tablet 3  . nitroGLYCERIN (NITROSTAT) 0.4 MG SL tablet Place 0.4 mg under the tongue every 5 (five) minutes as needed for chest pain.    Marland Kitchen olmesartan-hydrochlorothiazide (BENICAR HCT) 20-12.5 MG tablet TAKE 1 TABLET BY MOUTH EVERY DAY 90 tablet 3  .  Omega-3 Fatty Acids (FISH OIL) 1000 MG CAPS Take 1,000 mg by mouth 2 (two) times daily.    . ondansetron (ZOFRAN ODT) 4 MG disintegrating tablet Take one tab by mouth Q6hr prn nausea.  Dissolve under tongue. 12 tablet 0  . pravastatin (PRAVACHOL) 80 MG tablet Take 80 mg by mouth daily.    . TRICOR 145 MG tablet TAKE 1 TABLET BY MOUTH EVERY DAY 90 tablet 1   No current facility-administered medications for this visit.     Allergies:   Codeine; Lisinopril; Nsaids; and Latex    Social History:  The patient  reports that he quit smoking about 7 years ago. His smoking use included cigarettes. He has a 54.00 pack-year smoking history. he has never used smokeless tobacco. He reports that he does not drink alcohol or use drugs.   Family History:  The patient's family history includes Asthma in his brother; Heart  attack in his father; Heart disease in his father; Hypertension in his father; Stroke in his unknown relative.    ROS:  Please see the history of present illness.   Otherwise, review of systems are positive for none.   All other systems are reviewed and negative.    PHYSICAL EXAM: VS:  BP 118/64   Pulse (!) 55   Ht '5\' 8"'  (1.727 m)   Wt 187 lb 9.6 oz (85.1 kg)   BMI 28.52 kg/m  , BMI Body mass index is 28.52 kg/m. GEN: Well nourished, well developed, in no acute distress  HEENT: normal  Neck: no JVD,  or masses right carotid bruit.  Cardiac: RRR; no murmurs, rubs, or gallops,no edema  Respiratory:  clear to auscultation bilaterally, normal work of breathing GI: soft, nontender, nondistended, + BS MS: no deformity or atrophy  Skin: warm and dry, no rash Neuro:  Strength and sensation are intact Psych: euthymic mood, full affect  Vascular: Femoral pulses +1 bilaterally slightly stronger on the right than the left.   Distal pulses are palpable on the right but not left left  EKG:  EKG is ordered today. EKG showed sinus bradycardia with old inferior infarct and lateral T wave changes suggestive of ischemia.    Recent Labs: No results found for requested labs within last 8760 hours.    Lipid Panel No results found for: CHOL, TRIG, HDL, CHOLHDL, VLDL, LDLCALC, LDLDIRECT    Wt Readings from Last 3 Encounters:  08/30/17 187 lb 9.6 oz (85.1 kg)  08/06/17 180 lb (81.6 kg)  03/01/17 184 lb 12.8 oz (83.8 kg)        PAD Screen 11/30/2016  Previous PAD dx? Yes  Previous surgical procedure? No  Pain with walking? Yes  Subsides with rest? No  Feet/toe relief with dangling? Yes  Painful, non-healing ulcers? No  Extremities discolored? No      ASSESSMENT AND PLAN:  1.  Peripheral arterial disease: The patient has moderate atypical bilateral leg claudication.  No significant change since last visit.  He does not feel significantly limited by his symptoms and thus I recommend  continuing medical therapy.  2. Coronary artery disease without angina: Continue medical therapy.  3. Tobacco use: I discussed with him the strong association between peripheral arterial disease and smoking. I strongly advised him to quit smoking.  4. Carotid disease: Carotid Doppler In September showed  stable mild disease bilaterally.  Repeat study in 1 year due to significant plaque.    5. Hyperlipidemia: Currently on pravastatin and TriCor.  Disposition:   FU  with me in 9 months  Signed,  Kathlyn Sacramento, MD  08/30/2017 10:07 AM    [place]

## 2017-08-30 NOTE — Patient Instructions (Signed)
Medication Instructions: Your physician recommends that you continue on your current medications as directed. Please refer to the Current Medication list given to you today.  If you need a refill on your cardiac medications before your next appointment, please call your pharmacy.    Follow-Up: Your physician wants you to follow-up in 9 months in Dr. Fletcher Anon. You will receive a reminder letter in the mail two months in advance. If you don't receive a letter, please call our office at [phone] to schedule this follow-up appointment.   Thank you for choosing [facility] at [facility]!!

## 2017-10-11 ENCOUNTER — Inpatient Hospital Stay (HOSPITAL_COMMUNITY)
Admission: EM | Admit: 2017-10-11 | Discharge: 2017-10-14 | DRG: 281 | Disposition: A | Discharge status: Home / Self Care | Payer: Medicare Other | Attending: Internal Medicine | Admitting: Internal Medicine

## 2017-10-11 ENCOUNTER — Emergency Department (HOSPITAL_COMMUNITY): Payer: Medicare Other

## 2017-10-11 ENCOUNTER — Encounter (HOSPITAL_COMMUNITY): Payer: Self-pay

## 2017-10-11 DIAGNOSIS — N179 Acute kidney failure, unspecified: Secondary | ICD-10-CM | POA: Diagnosis not present

## 2017-10-11 DIAGNOSIS — Z951 Presence of aortocoronary bypass graft: Secondary | ICD-10-CM

## 2017-10-11 DIAGNOSIS — N189 Chronic kidney disease, unspecified: Secondary | ICD-10-CM | POA: Diagnosis present

## 2017-10-11 DIAGNOSIS — I1310 Hypertensive heart and chronic kidney disease without heart failure, with stage 1 through stage 4 chronic kidney disease, or unspecified chronic kidney disease: Secondary | ICD-10-CM | POA: Diagnosis present

## 2017-10-11 DIAGNOSIS — Z886 Allergy status to analgesic agent status: Secondary | ICD-10-CM

## 2017-10-11 DIAGNOSIS — E118 Type 2 diabetes mellitus with unspecified complications: Secondary | ICD-10-CM

## 2017-10-11 DIAGNOSIS — R079 Chest pain, unspecified: Secondary | ICD-10-CM | POA: Diagnosis not present

## 2017-10-11 DIAGNOSIS — I2510 Atherosclerotic heart disease of native coronary artery without angina pectoris: Secondary | ICD-10-CM | POA: Diagnosis present

## 2017-10-11 DIAGNOSIS — Z7982 Long term (current) use of aspirin: Secondary | ICD-10-CM

## 2017-10-11 DIAGNOSIS — I4892 Unspecified atrial flutter: Secondary | ICD-10-CM | POA: Diagnosis present

## 2017-10-11 DIAGNOSIS — I483 Typical atrial flutter: Secondary | ICD-10-CM

## 2017-10-11 DIAGNOSIS — I472 Ventricular tachycardia: Secondary | ICD-10-CM | POA: Diagnosis present

## 2017-10-11 DIAGNOSIS — I214 Non-ST elevation (NSTEMI) myocardial infarction: Secondary | ICD-10-CM | POA: Diagnosis not present

## 2017-10-11 DIAGNOSIS — Z91040 Latex allergy status: Secondary | ICD-10-CM

## 2017-10-11 DIAGNOSIS — Z885 Allergy status to narcotic agent status: Secondary | ICD-10-CM

## 2017-10-11 DIAGNOSIS — E785 Hyperlipidemia, unspecified: Secondary | ICD-10-CM | POA: Diagnosis present

## 2017-10-11 DIAGNOSIS — F329 Major depressive disorder, single episode, unspecified: Secondary | ICD-10-CM | POA: Diagnosis present

## 2017-10-11 DIAGNOSIS — Z955 Presence of coronary angioplasty implant and graft: Secondary | ICD-10-CM

## 2017-10-11 DIAGNOSIS — N183 Chronic kidney disease, stage 3 (moderate): Secondary | ICD-10-CM | POA: Diagnosis present

## 2017-10-11 DIAGNOSIS — R072 Precordial pain: Secondary | ICD-10-CM | POA: Diagnosis not present

## 2017-10-11 DIAGNOSIS — I252 Old myocardial infarction: Secondary | ICD-10-CM | POA: Diagnosis present

## 2017-10-11 DIAGNOSIS — I4729 Other ventricular tachycardia: Secondary | ICD-10-CM

## 2017-10-11 DIAGNOSIS — F17210 Nicotine dependence, cigarettes, uncomplicated: Secondary | ICD-10-CM | POA: Diagnosis present

## 2017-10-11 DIAGNOSIS — G4730 Sleep apnea, unspecified: Secondary | ICD-10-CM | POA: Diagnosis present

## 2017-10-11 DIAGNOSIS — I10 Essential (primary) hypertension: Secondary | ICD-10-CM | POA: Diagnosis present

## 2017-10-11 DIAGNOSIS — Z79899 Other long term (current) drug therapy: Secondary | ICD-10-CM

## 2017-10-11 DIAGNOSIS — Z794 Long term (current) use of insulin: Secondary | ICD-10-CM | POA: Diagnosis not present

## 2017-10-11 DIAGNOSIS — F17200 Nicotine dependence, unspecified, uncomplicated: Secondary | ICD-10-CM | POA: Diagnosis present

## 2017-10-11 DIAGNOSIS — Z888 Allergy status to other drugs, medicaments and biological substances status: Secondary | ICD-10-CM

## 2017-10-11 DIAGNOSIS — E1122 Type 2 diabetes mellitus with diabetic chronic kidney disease: Secondary | ICD-10-CM | POA: Diagnosis present

## 2017-10-11 DIAGNOSIS — E1142 Type 2 diabetes mellitus with diabetic polyneuropathy: Secondary | ICD-10-CM | POA: Diagnosis present

## 2017-10-11 DIAGNOSIS — Z7951 Long term (current) use of inhaled steroids: Secondary | ICD-10-CM

## 2017-10-11 DIAGNOSIS — E1151 Type 2 diabetes mellitus with diabetic peripheral angiopathy without gangrene: Secondary | ICD-10-CM | POA: Diagnosis present

## 2017-10-11 DIAGNOSIS — D72829 Elevated white blood cell count, unspecified: Secondary | ICD-10-CM | POA: Diagnosis present

## 2017-10-11 DIAGNOSIS — Z7902 Long term (current) use of antithrombotics/antiplatelets: Secondary | ICD-10-CM

## 2017-10-11 DIAGNOSIS — E119 Type 2 diabetes mellitus without complications: Secondary | ICD-10-CM

## 2017-10-11 DIAGNOSIS — Z8673 Personal history of transient ischemic attack (TIA), and cerebral infarction without residual deficits: Secondary | ICD-10-CM

## 2017-10-11 HISTORY — DX: Personal history of urinary calculi: Z87.442

## 2017-10-11 LAB — HEPATIC FUNCTION PANEL
ALT: 35 U/L (ref 17–63)
AST: 46 U/L — ABNORMAL HIGH (ref 15–41)
Albumin: 4.5 g/dL (ref 3.5–5.0)
Alkaline Phosphatase: 49 U/L (ref 38–126)
Bilirubin, Direct: <0.1 mg/dL — ABNORMAL LOW (ref 0.1–0.5)
Total Bilirubin: 0.6 mg/dL (ref 0.3–1.2)
Total Protein: 7.1 g/dL (ref 6.5–8.1)

## 2017-10-11 LAB — CBG MONITORING, ED: Glucose-Capillary: 201 mg/dL — ABNORMAL HIGH (ref 65–99)

## 2017-10-11 LAB — MAGNESIUM: Magnesium: 2.2 mg/dL (ref 1.7–2.4)

## 2017-10-11 LAB — CBC
HCT: 43.6 % (ref 39.0–52.0)
Hemoglobin: 15.1 g/dL (ref 13.0–17.0)
MCH: 31.1 pg (ref 26.0–34.0)
MCHC: 34.6 g/dL (ref 30.0–36.0)
MCV: 89.7 fL (ref 78.0–100.0)
Platelets: 257 10*3/uL (ref 150–400)
RBC: 4.86 MIL/uL (ref 4.22–5.81)
RDW: 13.6 % (ref 11.5–15.5)
WBC: 13.4 10*3/uL — ABNORMAL HIGH (ref 4.0–10.5)

## 2017-10-11 LAB — BASIC METABOLIC PANEL
Anion gap: 13 (ref 5–15)
BUN: 24 mg/dL — ABNORMAL HIGH (ref 6–20)
CO2: 23 mmol/L (ref 22–32)
Calcium: 10.3 mg/dL (ref 8.9–10.3)
Chloride: 107 mmol/L (ref 101–111)
Creatinine, Ser: 2.5 mg/dL — ABNORMAL HIGH (ref 0.61–1.24)
GFR calc Af Amer: 28 mL/min — ABNORMAL LOW (ref >60)
GFR calc non Af Amer: 24 mL/min — ABNORMAL LOW (ref >60)
Glucose, Bld: 150 mg/dL — ABNORMAL HIGH (ref 65–99)
Potassium: 4.6 mmol/L (ref 3.5–5.1)
Sodium: 143 mmol/L (ref 135–145)

## 2017-10-11 LAB — TROPONIN I: Troponin I: 5.16 ng/mL (ref <0.03)

## 2017-10-11 LAB — BRAIN NATRIURETIC PEPTIDE: B Natriuretic Peptide: 140.6 pg/mL — ABNORMAL HIGH (ref 0.0–100.0)

## 2017-10-11 LAB — TSH: TSH: 1.498 u[IU]/mL (ref 0.350–4.500)

## 2017-10-11 LAB — I-STAT TROPONIN, ED: Troponin i, poc: 0 ng/mL (ref 0.00–0.08)

## 2017-10-11 MED ORDER — SALINE SPRAY 0.65 % NA SOLN: 1.0000 | NASAL | Status: DC | PRN

## 2017-10-11 MED ORDER — FENOFIBRATE 160 MG PO TABS
160.0000 mg | ORAL_TABLET | Freq: Every day | ORAL | Status: DC
  Administered 2017-10-12 – 2017-10-14 (×2): 160 mg via ORAL
  Filled 2017-10-11 (×2): qty 1

## 2017-10-11 MED ORDER — GI COCKTAIL ~~LOC~~: 30.0000 mL | Freq: Four times a day (QID) | ORAL | Status: DC | PRN

## 2017-10-11 MED ORDER — IPRATROPIUM-ALBUTEROL 0.5-2.5 (3) MG/3ML IN SOLN: 3.0000 mL | RESPIRATORY_TRACT | Status: DC | PRN

## 2017-10-11 MED ORDER — ASPIRIN 81 MG PO CHEW
81.0000 mg | CHEWABLE_TABLET | Freq: Every day | ORAL | Status: DC
  Administered 2017-10-12 – 2017-10-13 (×2): 81 mg via ORAL
  Filled 2017-10-11 (×2): qty 1

## 2017-10-11 MED ORDER — PHENOL 1.4 % MT LIQD: 1.0000 | OROMUCOSAL | Status: DC | PRN

## 2017-10-11 MED ORDER — INSULIN GLARGINE 100 UNIT/ML ~~LOC~~ SOLN
15.0000 [IU] | Freq: Two times a day (BID) | SUBCUTANEOUS | Status: DC
  Administered 2017-10-11 – 2017-10-14 (×4): 15 [IU] via SUBCUTANEOUS
  Filled 2017-10-11 (×6): qty 0.15

## 2017-10-11 MED ORDER — ACETAMINOPHEN 325 MG PO TABS: 650.0000 mg | ORAL_TABLET | ORAL | Status: DC | PRN

## 2017-10-11 MED ORDER — ENOXAPARIN SODIUM 40 MG/0.4ML ~~LOC~~ SOLN
40.0000 mg | Freq: Every day | SUBCUTANEOUS | Status: DC
  Administered 2017-10-11: 40 mg via SUBCUTANEOUS
  Filled 2017-10-11: qty 0.4

## 2017-10-11 MED ORDER — POLYVINYL ALCOHOL 1.4 % OP SOLN: 1.0000 [drp] | OPHTHALMIC | Status: DC | PRN

## 2017-10-11 MED ORDER — ASPIRIN 325 MG PO TABS
325.0000 mg | ORAL_TABLET | ORAL | Status: AC
  Administered 2017-10-12: 325 mg via ORAL
  Filled 2017-10-11: qty 1

## 2017-10-11 MED ORDER — DILTIAZEM HCL-DEXTROSE 100-5 MG/100ML-% IV SOLN (PREMIX)
5.0000 mg/h | INTRAVENOUS | Status: DC
  Filled 2017-10-11: qty 100

## 2017-10-11 MED ORDER — SODIUM CHLORIDE 0.9 % IV BOLUS
500.0000 mL | Freq: Once | INTRAVENOUS | Status: AC
  Administered 2017-10-11: 500 mL via INTRAVENOUS

## 2017-10-11 MED ORDER — NITROGLYCERIN 0.4 MG SL SUBL: 0.4000 mg | SUBLINGUAL_TABLET | SUBLINGUAL | Status: DC | PRN

## 2017-10-11 MED ORDER — DILTIAZEM LOAD VIA INFUSION
10.0000 mg | Freq: Once | INTRAVENOUS | Status: DC
  Filled 2017-10-11: qty 10

## 2017-10-11 MED ORDER — PRAVASTATIN SODIUM 40 MG PO TABS
80.0000 mg | ORAL_TABLET | Freq: Every day | ORAL | Status: DC
  Administered 2017-10-11 – 2017-10-13 (×3): 80 mg via ORAL
  Filled 2017-10-11 (×3): qty 2

## 2017-10-11 MED ORDER — GUAIFENESIN-DM 100-10 MG/5ML PO SYRP
5.0000 mL | ORAL_SOLUTION | ORAL | Status: DC | PRN
Start: 2017-10-11 — End: 2017-10-14

## 2017-10-11 MED ORDER — BLISTEX MEDICATED EX OINT: 1.0000 "application " | TOPICAL_OINTMENT | CUTANEOUS | Status: DC | PRN

## 2017-10-11 MED ORDER — ONDANSETRON HCL 4 MG/2ML IJ SOLN: 4.0000 mg | Freq: Four times a day (QID) | INTRAMUSCULAR | Status: DC | PRN

## 2017-10-11 MED ORDER — FLUTICASONE PROPIONATE 50 MCG/ACT NA SUSP: 1.0000 | NASAL | Status: DC | PRN

## 2017-10-11 MED ORDER — CLOPIDOGREL BISULFATE 75 MG PO TABS
75.0000 mg | ORAL_TABLET | Freq: Every day | ORAL | Status: DC
  Administered 2017-10-12 – 2017-10-14 (×3): 75 mg via ORAL
  Filled 2017-10-11 (×3): qty 1

## 2017-10-11 MED ORDER — METOPROLOL TARTRATE 25 MG PO TABS
25.0000 mg | ORAL_TABLET | Freq: Two times a day (BID) | ORAL | Status: DC
  Administered 2017-10-11: 25 mg via ORAL
  Filled 2017-10-11 (×2): qty 1

## 2017-10-11 MED ORDER — ASPIRIN 325 MG PO TABS: 325.0000 mg | ORAL_TABLET | Freq: Every day | ORAL | Status: DC

## 2017-10-11 MED ORDER — MORPHINE SULFATE (PF) 4 MG/ML IV SOLN: 2.0000 mg | INTRAVENOUS | Status: DC | PRN

## 2017-10-11 NOTE — ED Triage Notes (Signed)
Pt states that around 6pm he began to have central chest pressure, SOB, nausea, diaphoretic dizzy with radiation to neck area. Pain has subsided some but remains in his jaw area. Hx of MI and stent placement, pt in A flutter

## 2017-10-11 NOTE — H&P (Addendum)
History and Physical    Marvin Salinas LHT:[id number] DOB: 07/28/1945 DOA: 10/11/2017  Referring MD/NP/PA: Dr. Nanda Quinton PCP: Aletha Halim., PA-C  Patient coming from: Home via EMS  Chief Complaint: Chest pain  I have personally briefly reviewed patient's old medical records in [place]   HPI: Marvin Salinas is a 72 y.o. male with medical history significant of HTN, HLD, CAD s/p CABG/PCI, and DM type II; Who presents with complaints of chest pain.  Patient had been working outside in his garden earlier in the day.  Around 1800 he became acutely diaphoretic and complained of pounding in his chest.  Patient reports having radiation of symptoms complaining of achiness in his jaw.  Associated symptoms included shortness of breath, nausea, and feelings of generalized weakness.  He tried taking a cool shower without relief of symptoms.  Patient he notes similar episodes of palpitations last occurring 1 month ago but less severe in nature.  Denies having any recent fever, travel, change in weight, leg swelling, calf pain, loss of consciousness, cough, loss of consciousness, diarrhea vomiting, or abdominal pain.  He reports being in his normal state of health prior to onset of symptoms.  He is followed by Dr. Acie Fredrickson of cardiology.  Patient was previously on Coumadin, Plavix, and aspirin, but this was discontinued after he had his CABG.  ED Course: Upon admission into the emergency department patient was seen to be afebrile, heart rates up to 165 to be atrial fibrillation with RVR/flutter, respirations 16-29, blood pressure 101/88 122/91, and O2 saturations 94-100% on room air.  Labs revealed WBC 13.4, potassium 4.6, magnesium 2.2, BUN 24, creatinine 2.5, glucose 150, TSH 1.498, BNP 140.6, and troponin 0.  Patient was ordered to be started on diltiazem drip, but spontaneously converted prior to this being initiated.  Patient had not been initially given aspirin, but currently denies being in any  acute chest pain.   Review of Systems  Constitutional: Positive for diaphoresis. Negative for fever.  HENT: Negative for nosebleeds and tinnitus.   Eyes: Negative for double vision and photophobia.  Respiratory: Positive for shortness of breath. Negative for cough.   Cardiovascular: Positive for chest pain. Negative for leg swelling.  Gastrointestinal: Positive for nausea. Negative for abdominal pain, constipation, diarrhea and vomiting.  Genitourinary: Negative for dysuria and hematuria.  Musculoskeletal: Negative for falls and joint pain.  Skin: Negative for itching and rash.  Neurological: Positive for weakness. Negative for focal weakness and loss of consciousness.  Psychiatric/Behavioral: Negative for memory loss and suicidal ideas.    Past Medical History:  Diagnosis Date  . Back pain   . Basilar artery stenosis    on chronic Plavix  . Cerebral vascular disease    with prior TIA's; followed by Dr. Erling Cruz  . Depression   . Diabetes mellitus    on insulin  . History of renal calculi   . Hyperlipidemia   . Hypertension   . Ischemic heart disease    prior PCI to RCA in 1989. S/P PCI to LAD and OM in 1992. S/P PCI to first DX in 2000. S/P CABG x 3 in May 2011  . Peripheral neuropathy   . Pneumonia Sept 2012  . S/P CABG x 21 Oct 2009  . Sleep apnea     Past Surgical History:  Procedure Laterality Date  . ANGIOPLASTY  1989   right coronary artery  . ANGIOPLASTY  1992   LAD and OM  . ANGIOPLASTY  1998  First DX  . CORONARY ARTERY BYPASS GRAFT  11/12/2009   LIMA to LAD, SVG to OM and SVG to RCA  . CORONARY STENT PLACEMENT  2000   Stent to LAD/Circumflex with angioplasty to first diagonal      reports that he quit smoking about 7 years ago. His smoking use included cigarettes. He has a 54.00 pack-year smoking history. He has never used smokeless tobacco. He reports that he does not drink alcohol or use drugs.  Allergies  Allergen Reactions  . Codeine Nausea And  Vomiting  . Lisinopril Cough    Other reaction(s): Cough (ALLERGY/intolerance)  . Nsaids     Other reaction(s): Other (See Comments) CKD Other reaction(s): Other CKD  . Latex Hives    Family History  Problem Relation Age of Onset  . Hypertension Father   . Heart disease Father   . Heart attack Father   . Asthma Brother   . Stroke Unknown        Uncle    Prior to Admission medications   Medication Sig Start Date End Date Taking? Authorizing Provider  Artificial Tear Ointment (DRY EYES OP) Place 1 drop into both eyes daily. Gen teal   Yes [provider]  aspirin 81 MG tablet Take 81 mg by mouth daily.     Yes [provider]  clopidogrel (PLAVIX) 75 MG tablet Take 1 tablet (75 mg total) by mouth daily. 11/01/16  Yes [name], Wonda Cheng, MD  fluticasone (FLONASE) 50 MCG/ACT nasal spray Place 1 spray into both nostrils as needed.   Yes [provider]  Hypertonic Nasal Wash (SINUS RINSE BOTTLE KIT NA) Place into the nose as needed.    Yes [provider]  insulin glargine (LANTUS) 100 UNIT/ML injection Inject 30 Units into the skin 2 (two) times daily.    Yes [provider]  loratadine (CLARITIN) 10 MG tablet Take 10 mg by mouth daily as needed.    Yes [provider]  metFORMIN (GLUCOPHAGE) 1000 MG tablet Take 1,000 mg by mouth 2 (two) times daily with a meal.     Yes [provider]  metoprolol tartrate (LOPRESSOR) 25 MG tablet TAKE 1 TABLET (25 MG TOTAL) BY MOUTH 2 (TWO) TIMES DAILY. 10/27/16  Yes [name], Wonda Cheng, MD  nitroGLYCERIN (NITROSTAT) 0.4 MG SL tablet Place 0.4 mg under the tongue every 5 (five) minutes as needed for chest pain.   Yes [provider]  olmesartan-hydrochlorothiazide (BENICAR HCT) 20-12.5 MG tablet TAKE 1 TABLET BY MOUTH EVERY DAY 11/24/16  Yes [name], Wonda Cheng, MD  Omega-3 Fatty Acids (FISH OIL) 1000 MG CAPS Take 1,000 mg by mouth 2 (two) times daily.   Yes [provider]    pravastatin (PRAVACHOL) 80 MG tablet Take 80 mg by mouth at bedtime.    Yes [provider]  TRICOR 145 MG tablet TAKE 1 TABLET BY MOUTH EVERY DAY 02/04/11  Yes Burtis Junes, NP  ondansetron (ZOFRAN ODT) 4 MG disintegrating tablet Take one tab by mouth Q6hr prn nausea.  Dissolve under tongue. Patient not taking: Reported on 10/11/2017 08/06/17   Kandra Nicolas, MD    Physical Exam:  Constitutional: NAD, calm, comfortable Vitals:   10/11/17 2015 10/11/17 2030 10/11/17 2045 10/11/17 2100  BP: 101/88 116/86 (!) 122/91 104/79  Pulse: (!) 153 (!) 101 61 65  Resp: (!) 26 (!) 29 (!) 22 16  Temp:      TempSrc:      SpO2: 94% 97%  96% 96%  Weight:      Height:       Eyes: PERRL, lids and conjunctivae normal ENMT: Mucous membranes are moist. Posterior pharynx clear of any exudate or lesions.Normal dentition.  Neck: normal, supple, no masses, no thyromegaly Respiratory: clear to auscultation bilaterally, no wheezing, no crackles. Normal respiratory effort. No accessory muscle use.  Cardiovascular: Regular rate and rhythm, no murmurs / rubs / gallops. No extremity edema. 2+ pedal pulses. No carotid bruits.  Abdomen: no tenderness, no masses palpated. No hepatosplenomegaly. Bowel sounds positive.  Musculoskeletal: no clubbing / cyanosis. No joint deformity upper and lower extremities. Good ROM, no contractures. Normal muscle tone.  Skin: no rashes, lesions, ulcers. No induration Neurologic: CN 2-12 grossly intact. Sensation intact, DTR normal. Strength 5/5 in all 4.  Psychiatric: Normal judgment and insight. Alert and oriented x 3. Normal mood.     Labs on Admission: I have personally reviewed following labs and imaging studies  CBC: Recent Labs  Lab 10/11/17 1954  WBC 13.4*  HGB 15.1  HCT 43.6  MCV 89.7  PLT 725   Basic Metabolic Panel: Recent Labs  Lab 10/11/17 1954  NA 143  K 4.6  CL 107  CO2 23  GLUCOSE 150*  BUN 24*  CREATININE 2.50*  CALCIUM 10.3  MG 2.2    GFR: Estimated Creatinine Clearance: 28.2 mL/min (A) (by C-G formula based on SCr of 2.5 mg/dL (H)). Liver Function Tests: Recent Labs  Lab 10/11/17 1954  AST 46*  ALT 35  ALKPHOS 49  BILITOT 0.6  PROT 7.1  ALBUMIN 4.5   No results for input(s): LIPASE, AMYLASE in the last 168 hours. No results for input(s): AMMONIA in the last 168 hours. Coagulation Profile: No results for input(s): INR, PROTIME in the last 168 hours. Cardiac Enzymes: No results for input(s): CKTOTAL, CKMB, CKMBINDEX, TROPONINI in the last 168 hours. BNP (last 3 results) No results for input(s): PROBNP in the last 8760 hours. HbA1C: No results for input(s): HGBA1C in the last 72 hours. CBG: No results for input(s): GLUCAP in the last 168 hours. Lipid Profile: No results for input(s): CHOL, HDL, LDLCALC, TRIG, CHOLHDL, LDLDIRECT in the last 72 hours. Thyroid Function Tests: Recent Labs    10/11/17 2054  TSH 1.498   Anemia Panel: No results for input(s): VITAMINB12, FOLATE, FERRITIN, TIBC, IRON, RETICCTPCT in the last 72 hours. Urine analysis:    Component Value Date/Time   COLORURINE YELLOW 06/16/2014 1211   APPEARANCEUR CLEAR 06/16/2014 1211   LABSPEC 1.022 06/16/2014 1211   PHURINE 5.0 06/16/2014 1211   GLUCOSEU NEGATIVE 06/16/2014 1211   HGBUR NEGATIVE 06/16/2014 1211   BILIRUBINUR NEGATIVE 06/16/2014 1211   KETONESUR NEGATIVE 06/16/2014 1211   PROTEINUR NEGATIVE 06/16/2014 1211   UROBILINOGEN 0.2 06/16/2014 1211   NITRITE NEGATIVE 06/16/2014 1211   LEUKOCYTESUR NEGATIVE 06/16/2014 1211   Sepsis Labs: No results found for this or any previous visit (from the past 240 hour(s)).   Radiological Exams on Admission: Dg Chest 2 View  Result Date: 10/11/2017 CLINICAL DATA:  Central chest pressure and dyspnea since 6 p.m. EXAM: CHEST - 2 VIEW COMPARISON:  03/05/2011 FINDINGS: Stable cardiomegaly with aortic atherosclerosis. No aneurysm. Status post CABG. Chronic interstitial prominence without  alveolar consolidation, effusion or pneumothorax. No acute osseous abnormality. IMPRESSION: 1. Chronic interstitial prominence without acute pulmonary disease. 2. Cardiomegaly with aortic atherosclerosis. Stable post CABG change. Electronically Signed   By: Ashley Royalty M.D.   On: 10/11/2017 21:34    EKG:  Independently reviewed.  Initial EKG appears to be atrial flutter with 2-1 conduction at 141 bpm and repeat after patient spontaneously converted appears sinus rhythm at 72 bpm with poor R wave progression.  Assessment/Plan NSTEMI, Chest pain: Acute.  Patient presents with acute onset of chest pain.  Heart score at least 6 (Hx,Age,Risk factors).  Initial troponin was noted to be negative and repeat EKG after patient spontaneously converted showing sinus from prior infarct.  Troponin was noted to be 0 but repeat check 5.16. - Admit to a telemetry bed - NPO - Give 325 mg of aspirin because 1 dose - Check cardiac troponins every 3 hours x3 -  Heparin gtt per pharmacy - Nitroglycerin as needed for chest pain - Check echocardiogram and lipid panel in a.m. - Message sent for cardiology to evaluate in a.m.  Paroxysmal atrial flutter: Patient presents with heart rates into the 160s seem to be atrial flutter/atrial fibrillation.  Patient spontaneously converted without need of intervention.  Review of records shows that patient has documentation noting cardioversion, but patient denies any history of known atrial fibrillation before.CHADsVASc score = at least 3.  Patient currently on Plavix and aspirin, question need to switch to anticoagulation. - Continue metoprolol, may need dose adjusted  Leukocytosis: Acute.  Initial WBC elevated at 13.4.  Patient reports being in his normal state of health prior to admission.  Chest x-ray otherwise noted to be clear.  Suspect could be reactive to above process. - Follow-up urinalysis  - Check CBC in a.m.  H/O CAD: Patient status post three-vessel CABG in 2011 and  prior stenting. - Continue Plavix and aspirin  Acute kidney injury superimposed chronic kidney disease stage III: Patient presents with creatinine of 2.5 and BUN 24. Baseline creatinine previously had been around 1.7-2 per review of records on care everywhere.  Question hypoperfusion secondary to arrhythmia vs dehydration given patient being outside in the heat gardening.  Patient was given 500 mL of normal saline IV fluids in the ED. - Check urine sodium, urea, and creatinine - Check urinalysis  - Recheck BMP in a.m.   Essential hypertension - Hold Benicar due to kidney function - Continue metoprolol as tolerated  Diabetes mellitus type 2: Patient on long-term insulin along with metformin. - Hypoglycemic protocols - Hold metformin - Decreased Lantus dose from 30 units subcutaneous twice daily to 15 units as patient n.p.o. - Adjust insulin regimen as needed  Dyslipidemia - Continue pravastatin and TriCor  Tobacco abuse: Patient reports smoking 5 cigarettes/day on average currently. - Counseled on need of cessation of tobacco use  DVT prophylaxis:   Lovenox Code Status: full  Family Communication: Discussed plan of care with patient and family present at bedside Disposition Plan: Likely discharge home once medically stable Consults called: none Admission status: Observation  Norval Morton MD [facility] Pager [phone]   If 7PM-7AM, please contact night-coverage [email or web address] Password [facility]  10/11/2017, 10:22 PM

## 2017-10-11 NOTE — ED Notes (Signed)
Pt HR converted to  irregular sinus rhythm, rate in 60s. Cardizem held, EKG repeated. Will get MD to confirm cardizem drip orders

## 2017-10-11 NOTE — ED Provider Notes (Signed)
Emergency Department Provider Note   I have reviewed the triage vital signs and the nursing notes.   HISTORY  Chief Complaint Chest Pain   HPI Marvin Salinas is a 71 y.o. male with PMH of CAD s/p CABG in 2011, HLD, HTN, DM, and PAD presents to the emergency department for evaluation of central chest tightness with diaphoresis and heart palpitations.  Patient states he has had intermittent palpitations in the distant past but nothing recently.  No obvious inciting event.  Continues to have occasional palpitations but denies any chest pain or diaphoresis at this time.  He felt slightly lightheaded at the time of onset but did not have a syncopal event.  No fevers or chills.  No recent medication changes.  His primary cardiologist is Dr. Cathie Olden.  No radiation of symptoms or modifying factors.  He did have some mild dyspnea with symptom onset but that has also resolved.  Past Medical History:  Diagnosis Date  . Back pain   . Basilar artery stenosis    on chronic Plavix  . Cerebral vascular disease    with prior TIA's; followed by Dr. Erling Cruz  . Depression   . Diabetes mellitus    on insulin  . History of renal calculi   . Hyperlipidemia   . Hypertension   . Ischemic heart disease    prior PCI to RCA in 1989. S/P PCI to LAD and OM in 1992. S/P PCI to first DX in 2000. S/P CABG x 3 in May 2011  . Peripheral neuropathy   . Pneumonia Sept 2012  . S/P CABG x 21 Oct 2009  . Sleep apnea     Patient Active Problem List   Diagnosis Date Noted  . Chest pain 10/11/2017  . CAD (coronary artery disease) 09/08/2015  . Benign prostatic hypertrophy without urinary obstruction 04/17/2014  . Arteriosclerosis of coronary artery 04/17/2014  . Chronic kidney disease (CKD), stage III (moderate) ([place]) 04/17/2014  . Diabetes mellitus, type 2 ([place]) 04/17/2014  . Diabetic neuropathy ([place]) 04/17/2014  . Essential (primary) hypertension 04/17/2014  . History of stroke 04/17/2014  . Cough 12/06/2013    . Tobacco use disorder 12/06/2013  . Allergic rhinitis 12/06/2013  . Depression, neurotic 11/21/2013  . Hyperlipidemia 03/15/2011  . CAD (coronary artery disease) of artery bypass graft 10/06/2010  . Diabetes mellitus 10/06/2010  . Cerebral vascular disease 10/06/2010  . S/P CABG x 3 10/06/2010  . HTN (hypertension) 10/06/2010  . Claudication ([place]) 10/06/2010  . Spinal stenosis 10/06/2010  . Enthesopathy of ankle and tarsus 09/04/2007    Past Surgical History:  Procedure Laterality Date  . ANGIOPLASTY  1989   right coronary artery  . ANGIOPLASTY  1992   LAD and OM  . ANGIOPLASTY  1998   First DX  . CORONARY ARTERY BYPASS GRAFT  11/12/2009   LIMA to LAD, SVG to OM and SVG to RCA  . CORONARY STENT PLACEMENT  2000   Stent to LAD/Circumflex with angioplasty to first diagonal     Current Outpatient Rx  . Order #: [id number] Class: Historical Med  . Order #: [id number] Class: Historical Med  . Order #: [id number] Class: Normal  . Order #: [id number] Class: Historical Med  . Order #: [id number] Class: Historical Med  . Order #: [id number] Class: Historical Med  . Order #: [id number] Class: Historical Med  . Order #: [id number] Class: Historical Med  . Order #: [id number] Class: Normal  . Order #: [id number] Class: Historical Med  . Order #: [id number] Class: Normal  . Order #:  [id number] Class: Historical Med  . Order #: [id number] Class: Historical Med  . Order #: [id number] Class: Normal  . Order #: [id number] Class: Normal    Allergies Codeine; Lisinopril; Nsaids; and Latex  Family History  Problem Relation Age of Onset  . Hypertension Father   . Heart disease Father   . Heart attack Father   . Asthma Brother   . Stroke Unknown        Uncle    Social History Social History   Tobacco Use  . Smoking status: Former Smoker    Packs/day: 1.00    Years: 54.00    Pack years: 54.00    Types: Cigarettes    Last attempt to quit: 10/19/2009    Years since quitting: 7.9  . Smokeless tobacco: Never  Used  Substance Use Topics  . Alcohol use: No  . Drug use: No    Review of Systems  Constitutional: No fever/chills. Positive lightheadedness.  Eyes: No visual changes. ENT: No sore throat. Cardiovascular: Positive CP and palpitations.  Respiratory: Positive shortness of breath. Gastrointestinal: No abdominal pain.  No nausea, no vomiting.  No diarrhea.  No constipation. Genitourinary: Negative for dysuria. Musculoskeletal: Negative for back pain. Skin: Negative for rash. Neurological: Negative for headaches, focal weakness or numbness.  10-point ROS otherwise negative.  ____________________________________________   PHYSICAL EXAM:  VITAL SIGNS: ED Triage Vitals  Enc Vitals Group     BP 10/11/17 1949 109/85     Pulse Rate 10/11/17 1949 (!) 141     Resp 10/11/17 1949 18     Temp 10/11/17 1949 (!) 97.4 F (36.3 C)     Temp Source 10/11/17 1949 Oral     SpO2 10/11/17 1949 100 %     Weight 10/11/17 1946 185 lb (83.9 kg)     Height 10/11/17 1946 5\' 8"  (1.727 m)     Pain Score 10/11/17 1950 8   Constitutional: Alert and oriented. Well appearing and in no acute distress. Eyes: Conjunctivae are normal. Head: Atraumatic. Nose: No congestion/rhinnorhea. Mouth/Throat: Mucous membranes are moist.  Oropharynx non-erythematous. Neck: No stridor.  Cardiovascular: A-flutter. Good peripheral circulation. Grossly normal heart sounds.   Respiratory: Normal respiratory effort.  No retractions. Lungs CTAB. Gastrointestinal: Soft and nontender. No distention.  Musculoskeletal: No lower extremity tenderness nor edema. No gross deformities of extremities. Neurologic:  Normal speech and language. No gross focal neurologic deficits are appreciated.  Skin:  Skin is warm, dry and intact. No rash noted.  ____________________________________________   LABS (all labs ordered are listed, but only abnormal results are displayed)  Labs Reviewed  BASIC METABOLIC PANEL - Abnormal; Notable for  the following components:      Result Value   Glucose, Bld 150 (*)    BUN 24 (*)    Creatinine, Ser 2.50 (*)    GFR calc non Af Amer 24 (*)    GFR calc Af Amer 28 (*)    All other components within normal limits  CBC - Abnormal; Notable for the following components:   WBC 13.4 (*)    All other components within normal limits  HEPATIC FUNCTION PANEL - Abnormal; Notable for the following components:   AST 46 (*)    Bilirubin, Direct <0.1 (*)    All other components within normal limits  BRAIN NATRIURETIC PEPTIDE - Abnormal; Notable for the following components:   B Natriuretic Peptide 140.6 (*)    All other components within normal limits  MAGNESIUM  TSH  TROPONIN I  TROPONIN I  TROPONIN I  LIPID PANEL  CBC WITH DIFFERENTIAL/PLATELET  BASIC METABOLIC PANEL  URINALYSIS, ROUTINE W REFLEX MICROSCOPIC  CREATININE, URINE, RANDOM  UREA NITROGEN, URINE  SODIUM, URINE, RANDOM  I-STAT TROPONIN, ED   ____________________________________________  EKG   EKG Interpretation  Date/Time:  Tuesday October 11 2017 20:52:16 EDT Ventricular Rate:  72 PR Interval:    QRS Duration: 86 QT Interval:  374 QTC Calculation: 410 R Axis:   -40 Text Interpretation:  Sinus rhythm Abnormal R-wave progression, late transition Inferior infarct, old Lateral leads are also involved No STEMI.  Confirmed by Nanda Quinton [phone]) on 10/11/2017 8:59:40 PM       ____________________________________________  RADIOLOGY  Dg Chest 2 View  Result Date: 10/11/2017 CLINICAL DATA:  Central chest pressure and dyspnea since 6 p.m. EXAM: CHEST - 2 VIEW COMPARISON:  03/05/2011 FINDINGS: Stable cardiomegaly with aortic atherosclerosis. No aneurysm. Status post CABG. Chronic interstitial prominence without alveolar consolidation, effusion or pneumothorax. No acute osseous abnormality. IMPRESSION: 1. Chronic interstitial prominence without acute pulmonary disease. 2. Cardiomegaly with aortic atherosclerosis. Stable post  CABG change. Electronically Signed   By: Ashley Royalty M.D.   On: 10/11/2017 21:34    ____________________________________________   PROCEDURES  Procedure(s) performed:   .Critical Care Performed by: Margette Fast, MD Authorized by: Margette Fast, MD   Critical care provider statement:    Critical care time (minutes):  35   Critical care time was exclusive of:  Separately billable procedures and treating other patients and teaching time   Critical care was necessary to treat or prevent imminent or life-threatening deterioration of the following conditions:  Circulatory failure   Critical care was time spent personally by me on the following activities:  Blood draw for specimens, discussions with consultants, development of treatment plan with patient or surrogate, evaluation of patient's response to treatment, examination of patient, obtaining history from patient or surrogate, ordering and performing treatments and interventions, ordering and review of laboratory studies, ordering and review of radiographic studies, pulse oximetry, re-evaluation of patient's condition and review of old charts   I assumed direction of critical care for this patient from another provider in my specialty: no      ____________________________________________   INITIAL IMPRESSION / [place] / ED COURSE  Pertinent labs & imaging results that were available during my care of the patient were reviewed by me and considered in my medical decision making (see chart for details).  Patient presents to the emergency department for evaluation of acute onset chest pain with diaphoresis and palpitations.  His chest pain and diaphoresis symptoms have resolved along with his dyspnea.  Patient remains in a flutter by triage EKG but on the monitor in his room it appears more consistent with A. fib with RVR at times given its irregularity with rate >160.  Blood pressure is normal.  Patient is awake and alert.   He reports some intermittent palpitations in the past is not anticoagulated.  Plan for diltiazem infusion for rate control and heparin.   08:59 PM Patient spontaneously converted to NSR prior to diltiazem. Held heparin for now.  Labs and imaging reviewed with acute findings. Plan for admit.   Discussed patient's case with Hospitlaist to request admission. Patient and family (if present) updated with plan. Care transferred to Hospitalist service.  I reviewed all nursing notes, vitals, pertinent old records, EKGs, labs, imaging (as available).  ____________________________________________  FINAL CLINICAL IMPRESSION(S) / ED DIAGNOSES  Final diagnoses:  Precordial chest pain  Typical atrial flutter (HCC)     MEDICATIONS GIVEN DURING THIS VISIT:  Medications  aspirin chewable tablet 81 mg (has no administration in time range)  clopidogrel (PLAVIX) tablet 75 mg (has no administration in time range)  metoprolol tartrate (LOPRESSOR) tablet 25 mg (has no administration in time range)  fluticasone (FLONASE) 50 MCG/ACT nasal spray 1 spray (has no administration in time range)  fenofibrate tablet 160 mg (has no administration in time range)  pravastatin (PRAVACHOL) tablet 80 mg (has no administration in time range)  nitroGLYCERIN (NITROSTAT) SL tablet 0.4 mg (has no administration in time range)  insulin glargine (LANTUS) injection 15 Units (has no administration in time range)  acetaminophen (TYLENOL) tablet 650 mg (has no administration in time range)  ondansetron (ZOFRAN) injection 4 mg (has no administration in time range)  enoxaparin (LOVENOX) injection 40 mg (has no administration in time range)  morphine 4 MG/ML injection 2 mg (has no administration in time range)  gi cocktail (Maalox,Lidocaine,Donnatal) (has no administration in time range)  ipratropium-albuterol (DUONEB) 0.5-2.5 (3) MG/3ML nebulizer solution 3 mL (has no administration in time range)  lip balm (BLISTEX) ointment 1  application (has no administration in time range)  guaiFENesin-dextromethorphan (ROBITUSSIN DM) 100-10 MG/5ML syrup 5 mL (has no administration in time range)  polyvinyl alcohol (LIQUIFILM TEARS) 1.4 % ophthalmic solution 1 drop (has no administration in time range)  phenol (CHLORASEPTIC) mouth spray 1 spray (has no administration in time range)  sodium chloride (OCEAN) 0.65 % nasal spray 1 spray (has no administration in time range)  sodium chloride 0.9 % bolus 500 mL (0 mLs Intravenous Stopped 10/11/17 2147)    Note:  This document was prepared using Dragon voice recognition software and may include unintentional dictation errors.  Nanda Quinton, MD Emergency Medicine    Tiffanyann Deroo, [facility], MD 10/11/17 2330

## 2017-10-12 ENCOUNTER — Other Ambulatory Visit (HOSPITAL_COMMUNITY): Payer: Medicare Other

## 2017-10-12 ENCOUNTER — Encounter (HOSPITAL_COMMUNITY): Payer: Self-pay | Admitting: General Practice

## 2017-10-12 ENCOUNTER — Observation Stay (HOSPITAL_COMMUNITY): Payer: Medicare Other

## 2017-10-12 ENCOUNTER — Other Ambulatory Visit: Payer: Self-pay

## 2017-10-12 DIAGNOSIS — I1310 Hypertensive heart and chronic kidney disease without heart failure, with stage 1 through stage 4 chronic kidney disease, or unspecified chronic kidney disease: Secondary | ICD-10-CM | POA: Diagnosis present

## 2017-10-12 DIAGNOSIS — R079 Chest pain, unspecified: Secondary | ICD-10-CM | POA: Diagnosis not present

## 2017-10-12 DIAGNOSIS — I10 Essential (primary) hypertension: Secondary | ICD-10-CM | POA: Diagnosis not present

## 2017-10-12 DIAGNOSIS — F329 Major depressive disorder, single episode, unspecified: Secondary | ICD-10-CM | POA: Diagnosis present

## 2017-10-12 DIAGNOSIS — E1151 Type 2 diabetes mellitus with diabetic peripheral angiopathy without gangrene: Secondary | ICD-10-CM | POA: Diagnosis present

## 2017-10-12 DIAGNOSIS — F17210 Nicotine dependence, cigarettes, uncomplicated: Secondary | ICD-10-CM | POA: Diagnosis present

## 2017-10-12 DIAGNOSIS — G4730 Sleep apnea, unspecified: Secondary | ICD-10-CM | POA: Diagnosis present

## 2017-10-12 DIAGNOSIS — E118 Type 2 diabetes mellitus with unspecified complications: Secondary | ICD-10-CM

## 2017-10-12 DIAGNOSIS — Z794 Long term (current) use of insulin: Secondary | ICD-10-CM | POA: Diagnosis not present

## 2017-10-12 DIAGNOSIS — Z8673 Personal history of transient ischemic attack (TIA), and cerebral infarction without residual deficits: Secondary | ICD-10-CM | POA: Diagnosis not present

## 2017-10-12 DIAGNOSIS — N179 Acute kidney failure, unspecified: Secondary | ICD-10-CM

## 2017-10-12 DIAGNOSIS — E1122 Type 2 diabetes mellitus with diabetic chronic kidney disease: Secondary | ICD-10-CM | POA: Diagnosis present

## 2017-10-12 DIAGNOSIS — I483 Typical atrial flutter: Secondary | ICD-10-CM | POA: Insufficient documentation

## 2017-10-12 DIAGNOSIS — E785 Hyperlipidemia, unspecified: Secondary | ICD-10-CM | POA: Diagnosis present

## 2017-10-12 DIAGNOSIS — Z885 Allergy status to narcotic agent status: Secondary | ICD-10-CM | POA: Diagnosis not present

## 2017-10-12 DIAGNOSIS — Z886 Allergy status to analgesic agent status: Secondary | ICD-10-CM | POA: Diagnosis not present

## 2017-10-12 DIAGNOSIS — I4892 Unspecified atrial flutter: Secondary | ICD-10-CM | POA: Diagnosis not present

## 2017-10-12 DIAGNOSIS — I252 Old myocardial infarction: Secondary | ICD-10-CM | POA: Diagnosis present

## 2017-10-12 DIAGNOSIS — Z7902 Long term (current) use of antithrombotics/antiplatelets: Secondary | ICD-10-CM | POA: Diagnosis not present

## 2017-10-12 DIAGNOSIS — I214 Non-ST elevation (NSTEMI) myocardial infarction: Principal | ICD-10-CM

## 2017-10-12 DIAGNOSIS — N189 Chronic kidney disease, unspecified: Secondary | ICD-10-CM

## 2017-10-12 DIAGNOSIS — Z888 Allergy status to other drugs, medicaments and biological substances status: Secondary | ICD-10-CM | POA: Diagnosis not present

## 2017-10-12 DIAGNOSIS — N183 Chronic kidney disease, stage 3 (moderate): Secondary | ICD-10-CM | POA: Diagnosis present

## 2017-10-12 DIAGNOSIS — I472 Ventricular tachycardia: Secondary | ICD-10-CM | POA: Diagnosis present

## 2017-10-12 DIAGNOSIS — I2510 Atherosclerotic heart disease of native coronary artery without angina pectoris: Secondary | ICD-10-CM | POA: Diagnosis present

## 2017-10-12 DIAGNOSIS — Z79899 Other long term (current) drug therapy: Secondary | ICD-10-CM | POA: Diagnosis not present

## 2017-10-12 DIAGNOSIS — F17200 Nicotine dependence, unspecified, uncomplicated: Secondary | ICD-10-CM | POA: Diagnosis not present

## 2017-10-12 DIAGNOSIS — Z7982 Long term (current) use of aspirin: Secondary | ICD-10-CM | POA: Diagnosis not present

## 2017-10-12 DIAGNOSIS — Z91040 Latex allergy status: Secondary | ICD-10-CM | POA: Diagnosis not present

## 2017-10-12 DIAGNOSIS — Z7951 Long term (current) use of inhaled steroids: Secondary | ICD-10-CM | POA: Diagnosis not present

## 2017-10-12 DIAGNOSIS — R072 Precordial pain: Secondary | ICD-10-CM | POA: Diagnosis present

## 2017-10-12 DIAGNOSIS — Z951 Presence of aortocoronary bypass graft: Secondary | ICD-10-CM | POA: Diagnosis not present

## 2017-10-12 HISTORY — DX: Acute kidney failure, unspecified: N17.9

## 2017-10-12 HISTORY — DX: Chronic kidney disease, unspecified: N18.9

## 2017-10-12 LAB — CBC WITH DIFFERENTIAL/PLATELET
Basophils Absolute: 0 10*3/uL (ref 0.0–0.1)
Basophils Relative: 0 %
Eosinophils Absolute: 0.2 10*3/uL (ref 0.0–0.7)
Eosinophils Relative: 2 %
HCT: 39.1 % (ref 39.0–52.0)
Hemoglobin: 13.3 g/dL (ref 13.0–17.0)
Lymphocytes Relative: 23 %
Lymphs Abs: 2.6 10*3/uL (ref 0.7–4.0)
MCH: 30.4 pg (ref 26.0–34.0)
MCHC: 34 g/dL (ref 30.0–36.0)
MCV: 89.5 fL (ref 78.0–100.0)
Monocytes Absolute: 0.8 10*3/uL (ref 0.1–1.0)
Monocytes Relative: 7 %
Neutro Abs: 7.5 10*3/uL (ref 1.7–7.7)
Neutrophils Relative %: 68 %
Platelets: 245 10*3/uL (ref 150–400)
RBC: 4.37 MIL/uL (ref 4.22–5.81)
RDW: 13.7 % (ref 11.5–15.5)
WBC: 11.2 10*3/uL — ABNORMAL HIGH (ref 4.0–10.5)

## 2017-10-12 LAB — BASIC METABOLIC PANEL
Anion gap: 11 (ref 5–15)
BUN: 27 mg/dL — ABNORMAL HIGH (ref 6–20)
CO2: 21 mmol/L — ABNORMAL LOW (ref 22–32)
Calcium: 9.3 mg/dL (ref 8.9–10.3)
Chloride: 109 mmol/L (ref 101–111)
Creatinine, Ser: 2.83 mg/dL — ABNORMAL HIGH (ref 0.61–1.24)
GFR calc Af Amer: 24 mL/min — ABNORMAL LOW (ref >60)
GFR calc non Af Amer: 21 mL/min — ABNORMAL LOW (ref >60)
Glucose, Bld: 110 mg/dL — ABNORMAL HIGH (ref 65–99)
Potassium: 4.1 mmol/L (ref 3.5–5.1)
Sodium: 141 mmol/L (ref 135–145)

## 2017-10-12 LAB — URINALYSIS, ROUTINE W REFLEX MICROSCOPIC
Bilirubin Urine: NEGATIVE
Glucose, UA: NEGATIVE mg/dL
Hgb urine dipstick: NEGATIVE
Ketones, ur: 5 mg/dL — AB
Leukocytes, UA: NEGATIVE
Nitrite: NEGATIVE
Protein, ur: 30 mg/dL — AB
Specific Gravity, Urine: 1.021 (ref 1.005–1.030)
pH: 5 (ref 5.0–8.0)

## 2017-10-12 LAB — LIPID PANEL
Cholesterol: 171 mg/dL (ref 0–200)
HDL: 42 mg/dL (ref >40)
LDL Cholesterol: 111 mg/dL — ABNORMAL HIGH (ref 0–99)
Total CHOL/HDL Ratio: 4.1 RATIO
Triglycerides: 92 mg/dL (ref <150)
VLDL: 18 mg/dL (ref 0–40)

## 2017-10-12 LAB — CREATININE, URINE, RANDOM: Creatinine, Urine: 249 mg/dL

## 2017-10-12 LAB — TROPONIN I
Troponin I: 12.27 ng/mL (ref <0.03)
Troponin I: 25.47 ng/mL (ref <0.03)

## 2017-10-12 LAB — GLUCOSE, CAPILLARY
Glucose-Capillary: 146 mg/dL — ABNORMAL HIGH (ref 65–99)
Glucose-Capillary: 193 mg/dL — ABNORMAL HIGH (ref 65–99)

## 2017-10-12 LAB — SODIUM, URINE, RANDOM: Sodium, Ur: 68 mmol/L

## 2017-10-12 LAB — ECHOCARDIOGRAM COMPLETE
Height: 68 in
Weight: 2960 oz

## 2017-10-12 LAB — HEPARIN LEVEL (UNFRACTIONATED)
Heparin Unfractionated: 0.66 IU/mL (ref 0.30–0.70)
Heparin Unfractionated: 0.44 IU/mL (ref 0.30–0.70)

## 2017-10-12 MED ORDER — INSULIN ASPART 100 UNIT/ML ~~LOC~~ SOLN: 0.0000 [IU] | Freq: Every day | SUBCUTANEOUS | Status: DC

## 2017-10-12 MED ORDER — INSULIN ASPART 100 UNIT/ML ~~LOC~~ SOLN
0.0000 [IU] | Freq: Three times a day (TID) | SUBCUTANEOUS | Status: DC
  Administered 2017-10-12: 1 [IU] via SUBCUTANEOUS

## 2017-10-12 MED ORDER — HEPARIN (PORCINE) IN NACL 100-0.45 UNIT/ML-% IJ SOLN
1100.0000 [IU]/h | INTRAMUSCULAR | Status: DC
  Administered 2017-10-12 (×2): 1100 [IU]/h via INTRAVENOUS
  Filled 2017-10-12 (×2): qty 250

## 2017-10-12 MED ORDER — METOPROLOL TARTRATE 12.5 MG HALF TABLET
12.5000 mg | ORAL_TABLET | Freq: Two times a day (BID) | ORAL | Status: DC
  Administered 2017-10-12 – 2017-10-14 (×4): 12.5 mg via ORAL
  Filled 2017-10-12 (×4): qty 1

## 2017-10-12 MED ORDER — HEPARIN BOLUS VIA INFUSION
4500.0000 [IU] | Freq: Once | INTRAVENOUS | Status: AC
  Administered 2017-10-12: 4500 [IU] via INTRAVENOUS
  Filled 2017-10-12: qty 4500

## 2017-10-12 MED ORDER — SODIUM CHLORIDE 0.9 % IV SOLN
INTRAVENOUS | Status: DC
  Administered 2017-10-12 (×2): via INTRAVENOUS

## 2017-10-12 NOTE — ED Notes (Signed)
Cardiology paged to [facility] @ [phone] due to pt having runs of [facility].

## 2017-10-12 NOTE — Progress Notes (Signed)
[place] for Heparin  Indication: chest pain/ACS and Afib/flutter  Allergies  Allergen Reactions  . Codeine Nausea And Vomiting  . Lisinopril Cough    Other reaction(s): Cough (ALLERGY/intolerance)  . Nsaids     Other reaction(s): Other (See Comments) CKD Other reaction(s): Other CKD  . Latex Hives   Patient Measurements: Height: 5\' 8"  (172.7 cm) Weight: 185 lb (83.9 kg) IBW/kg (Calculated) : 68.4  Vital Signs: BP: 117/71 (04/24 0815) Pulse Rate: 56 (04/24 0815)  Labs: Recent Labs    10/11/17 1954 10/11/17 2302 10/12/17 0115 10/12/17 0355 10/12/17 0927  HGB 15.1  --   --  13.3  --   HCT 43.6  --   --  39.1  --   PLT 257  --   --  245  --   HEPARINUNFRC  --   --   --   --  0.66  CREATININE 2.50*  --   --  2.83*  --   TROPONINI  --  5.16* 12.27* 25.47*  --     Estimated Creatinine Clearance: 24.9 mL/min (A) (by C-G formula based on SCr of 2.83 mg/dL (H)).   Medical History: Past Medical History:  Diagnosis Date  . Back pain   . Basilar artery stenosis    on chronic Plavix  . Cerebral vascular disease    with prior TIA's; followed by Dr. Erling Cruz  . Depression   . Diabetes mellitus    on insulin  . History of renal calculi   . Hyperlipidemia   . Hypertension   . Ischemic heart disease    prior PCI to RCA in 1989. S/P PCI to LAD and OM in 1992. S/P PCI to first DX in 2000. S/P CABG x 3 in May 2011  . Peripheral neuropathy   . Pneumonia Sept 2012  . S/P CABG x 21 Oct 2009  . Sleep apnea    Assessment: 72 y/o M presents to the ED with chest pressure, found to be in atrial flutter, troponin elevated, continuing on heparin per pharmacy. Initial heparin level therapeutic. CBC wnl. No bleed documented. Not currently on anticoagulation PTA.  Goal of Therapy:  Heparin level 0.3-0.7 units/ml Monitor platelets by anticoagulation protocol: Yes   Plan:  Continue heparin drip at 1100 units/hr 8h heparin level to  confirm Monitor daily heparin level and CBC, s/sx bleeding F/u Cardiology plans  Elicia Lamp, PharmD, BCPS Clinical Pharmacist 10/12/2017 10:09 AM

## 2017-10-12 NOTE — Progress Notes (Signed)
Notified and paged on call MD regarding 5 beat run of V.Tach non sustained Neta Mends RN 5:53 PM 10-12-2017

## 2017-10-12 NOTE — ED Notes (Signed)
Meal Tray ordered at 11:10. 

## 2017-10-12 NOTE — Progress Notes (Addendum)
ANTICOAGULATION CONSULT NOTE - Follow Up Consult  Pharmacy Consult for Heparin Indication: atrial fibrillation, NSTEMI  Allergies  Allergen Reactions  . Codeine Nausea And Vomiting  . Lisinopril Cough    Other reaction(s): Cough (ALLERGY/intolerance)  . Nsaids     Other reaction(s): Other (See Comments) CKD Other reaction(s): Other CKD  . Latex Hives    Patient Measurements: Height: 5\' 8"  (172.7 cm) Weight: 186 lb (84.4 kg) IBW/kg (Calculated) : 68.4 Heparin Dosing Weight:  84.4 kg  Vital Signs: Temp: 98 F (36.7 C) (04/24 1300) Temp Source: Oral (04/24 1300) BP: 118/64 (04/24 1329) Pulse Rate: 53 (04/24 1329)  Labs: Recent Labs    10/11/17 1954 10/11/17 2302 10/12/17 0115 10/12/17 0355 10/12/17 0927 10/12/17 1650  HGB 15.1  --   --  13.3  --   --   HCT 43.6  --   --  39.1  --   --   PLT 257  --   --  245  --   --   HEPARINUNFRC  --   --   --   --  0.66 0.44  CREATININE 2.50*  --   --  2.83*  --   --   TROPONINI  --  5.16* 12.27* 25.47*  --   --     Estimated Creatinine Clearance: 25 mL/min (A) (by C-G formula based on SCr of 2.83 mg/dL (H)).  Assessment:  Anticoag: hep for afib/flutter, NSTEMI.  no AC currently PTA. Cbc wnl. Heparin level 0.66 this AM remains in goal range 0.44 currently.  Goal of Therapy:  Heparin level 0.3-0.7 units/ml Monitor platelets by anticoagulation protocol: Yes   Plan:  Continue heparin drip at 1100 units/hr Monitor daily heparin level and CBC,   Marvin Salinas, PharmD, BCPS Clinical Staff Pharmacist Pager [phone]  Marvin Salinas 10/12/2017,5:49 PM

## 2017-10-12 NOTE — ED Notes (Signed)
Cards at bedside

## 2017-10-12 NOTE — ED Notes (Signed)
Report given to [place], RN on floor

## 2017-10-12 NOTE — ED Notes (Signed)
Pt. Alert and resting with family @ bedside. Vital signs documented

## 2017-10-12 NOTE — Progress Notes (Signed)
ANTICOAGULATION CONSULT NOTE - Initial Consult  Pharmacy Consult for Heparin  Indication: chest pain/ACS and Afib/flutter  Allergies  Allergen Reactions  . Codeine Nausea And Vomiting  . Lisinopril Cough    Other reaction(s): Cough (ALLERGY/intolerance)  . Nsaids     Other reaction(s): Other (See Comments) CKD Other reaction(s): Other CKD  . Latex Hives   Patient Measurements: Height: 5\' 8"  (172.7 cm) Weight: 185 lb (83.9 kg) IBW/kg (Calculated) : 68.4  Vital Signs: Temp: 97.4 F (36.3 C) (04/23 1949) Temp Source: Oral (04/23 1949) BP: 119/60 (04/23 2245) Pulse Rate: 65 (04/23 2245)  Labs: Recent Labs    10/11/17 1954 10/11/17 2302  HGB 15.1  --   HCT 43.6  --   PLT 257  --   CREATININE 2.50*  --   TROPONINI  --  5.16*    Estimated Creatinine Clearance: 28.2 mL/min (A) (by C-G formula based on SCr of 2.5 mg/dL (H)).   Medical History: Past Medical History:  Diagnosis Date  . Back pain   . Basilar artery stenosis    on chronic Plavix  . Cerebral vascular disease    with prior TIA's; followed by Dr. Erling Cruz  . Depression   . Diabetes mellitus    on insulin  . History of renal calculi   . Hyperlipidemia   . Hypertension   . Ischemic heart disease    prior PCI to RCA in 1989. S/P PCI to LAD and OM in 1992. S/P PCI to first DX in 2000. S/P CABG x 3 in May 2011  . Peripheral neuropathy   . Pneumonia Sept 2012  . S/P CABG x 21 Oct 2009  . Sleep apnea    Assessment: 72 y/o M presents to the ED with chest pressure, found to be in atrial flutter, troponin is elevated, starting heparin per pharmacy, CBC good, noted renal dysfunction, PTA meds reviewed.   Goal of Therapy:  Heparin level 0.3-0.7 units/ml Monitor platelets by anticoagulation protocol: Yes   Plan:  Heparin 4500 units BOLUS Start heparin drip at 1100 units/hr 0900 HL Daily CBC/HL Monitor for bleeding   [place], [place] 10/12/2017,12:10 AM

## 2017-10-12 NOTE — ED Notes (Signed)
Report attempted 

## 2017-10-12 NOTE — Consult Note (Signed)
Cardiology Consult    Patient ID: Marvin Salinas MRN: [id number], DOB/AGE: 1946/05/28   Admit date: 10/11/2017 Date of Consult: 10/12/2017  Primary Physician: Aletha Halim., PA-C Primary Cardiologist: Dr. Acie Fredrickson Requesting Provider: Dr. Tamala Julian Reason for Consultation: Chest pain, Aflutter  [name] is a 72 y.o. male who is being seen today for the evaluation of chest pain and atrial flutter at the request of Dr. Tamala Julian.  Patient Profile    72 yo male with PMH of CAD s/p PCI-RCA ('89), PCI-LAD/OM ('92) 3v CABG ('11), HL, HTN, DM, TIA and left cerebellar stroke with carotid artery disease who presented with new on set Aflutter RVR and chest pain.   Past Medical History   Past Medical History:  Diagnosis Date  . Back pain   . Basilar artery stenosis    on chronic Plavix  . Cerebral vascular disease    with prior TIA's; followed by Dr. Erling Cruz  . Depression   . Diabetes mellitus    on insulin  . History of renal calculi   . Hyperlipidemia   . Hypertension   . Ischemic heart disease    prior PCI to RCA in 1989. S/P PCI to LAD and OM in 1992. S/P PCI to first DX in 2000. S/P CABG x 3 in May 2011  . Peripheral neuropathy   . Pneumonia Sept 2012  . S/P CABG x 21 Oct 2009  . Sleep apnea     Past Surgical History:  Procedure Laterality Date  . ANGIOPLASTY  1989   right coronary artery  . ANGIOPLASTY  1992   LAD and OM  . ANGIOPLASTY  1998   First DX  . CORONARY ARTERY BYPASS GRAFT  11/12/2009   LIMA to LAD, SVG to OM and SVG to RCA  . CORONARY STENT PLACEMENT  2000   Stent to LAD/Circumflex with angioplasty to first diagonal      Allergies  Allergies  Allergen Reactions  . Codeine Nausea And Vomiting  . Lisinopril Cough    Other reaction(s): Cough (ALLERGY/intolerance)  . Nsaids     Other reaction(s): Other (See Comments) CKD Other reaction(s): Other CKD  . Latex Hives    History of Present Illness    [name] is a 72 yo male with PMH of CAD s/p  PCI-RCA ('89), PCI-LAD/OM ('92) 3v CABG ('11), HL, HTN, DM, TIA and left cerebellar stroke with carotid artery disease. Last carotid dopplers in 4/15 showed 40-59% bilaterally. He is followed by Dr. Acie Fredrickson as an outpatient. Last seen in the office on 5/18 and reported doing well. Did report claudication with referral to Dr. Fletcher Anon after abnormal ABIs. He was last seen on 9/18 and the option of PV angiogram was discussed but he was hesitant to proceed given his risk of contrast nephropathy. He states he is followed by a Nephrologist in [place], but unsure of what his baseline Cr runs. In review of Care Everywhere labs from 09/22/17 show Cr of 1.91. States he has been on metformin, told that he should be taken off by his Nephrologist but needed to talk with his PCP regarding this. He continues to smoke, 2-3 cigarettes a day.   He reports being in his usual state of health until yesterday evening around 6pm. Had been outside working in the yard earlier that evening and had been relatively asymptomatic. While at rest developed sudden onset of palpitations with chest pain, shortness of breath, and nausea. Pain radiated up into his jaws bilaterally, and then  felt numb. This lasted for about 30 minutes before he decided to go to the ED.   In the ED his labs showed stable electrolytes, Cr 2.5>>2.8, POC trop neg, Trop 5.16>>12.27>>25.47, Hgb 15.1. EKG on admission showed Aflutter rate 140. Plan was to start him on Dilt, but he spontaneously converted to SR. Follow up EKG showed SR with q wave in lead III present on old EKGs. He reports his chest pain resolved once he was back in SR.   Inpatient Medications    . aspirin  81 mg Oral Daily  . clopidogrel  75 mg Oral Daily  . fenofibrate  160 mg Oral Daily  . insulin glargine  15 Units Subcutaneous BID  . metoprolol tartrate  25 mg Oral BID  . pravastatin  80 mg Oral QHS    Family History    Family History  Problem Relation Age of Onset  . Hypertension Father     . Heart disease Father   . Heart attack Father   . Asthma Brother   . Stroke Unknown        Uncle    Social History    Social History   Socioeconomic History  . Marital status: Divorced    Spouse name: Not on file  . Number of children: 0  . Years of education: GED  . Highest education level: Not on file  Occupational History  . Occupation: retired  Scientific laboratory technician  . Financial resource strain: Not on file  . Food insecurity:    Worry: Not on file    Inability: Not on file  . Transportation needs:    Medical: Not on file    Non-medical: Not on file  Tobacco Use  . Smoking status: Former Smoker    Packs/day: 1.00    Years: 54.00    Pack years: 54.00    Types: Cigarettes    Last attempt to quit: 10/19/2009    Years since quitting: 7.9  . Smokeless tobacco: Never Used  Substance and Sexual Activity  . Alcohol use: No  . Drug use: No  . Sexual activity: Never  Lifestyle  . Physical activity:    Days per week: Not on file    Minutes per session: Not on file  . Stress: Not on file  Relationships  . Social connections:    Talks on phone: Not on file    Gets together: Not on file    Attends religious service: Not on file    Active member of club or organization: Not on file    Attends meetings of clubs or organizations: Not on file    Relationship status: Not on file  . Intimate partner violence:    Fear of current or ex partner: Not on file    Emotionally abused: Not on file    Physically abused: Not on file    Forced sexual activity: Not on file  Other Topics Concern  . Not on file  Social History Narrative   Patient is Divorced.   Patient has his GED.   Patient is right handed.   Patient does not drink any caffeine.     Review of Systems    See HPI  All other systems reviewed and are otherwise negative except as noted above.  Physical Exam    Blood pressure 117/71, pulse (!) 56, temperature (!) 97.4 F (36.3 C), temperature source Oral, resp. rate  19, height 5\' 8"  (1.727 m), weight 185 lb (83.9 kg), SpO2 99 %.  General: Pleasant, NAD Psych: Normal affect. Neuro: Alert and oriented X 3. Moves all extremities spontaneously. HEENT: Normal  Neck: Supple without bruits or JVD. Lungs:  Resp regular and unlabored, CTA. Heart: RRR no s3, s4, or murmurs. Abdomen: Soft, non-tender, non-distended, BS + x 4.  Extremities: No clubbing, cyanosis or edema. DP/PT/Radials 2+ and equal bilaterally.  Labs    Troponin [facility] of Care Test) Recent Labs    10/11/17 2006  TROPIPOC 0.00   Recent Labs    10/11/17 2302 10/12/17 0115 10/12/17 0355  TROPONINI 5.16* 12.27* 25.47*   Lab Results  Component Value Date   WBC 11.2 (H) 10/12/2017   HGB 13.3 10/12/2017   HCT 39.1 10/12/2017   MCV 89.5 10/12/2017   PLT 245 10/12/2017    Recent Labs  Lab 10/11/17 1954 10/12/17 0355  NA 143 141  K 4.6 4.1  CL 107 109  CO2 23 21*  BUN 24* 27*  CREATININE 2.50* 2.83*  CALCIUM 10.3 9.3  PROT 7.1  --   BILITOT 0.6  --   ALKPHOS 49  --   ALT 35  --   AST 46*  --   GLUCOSE 150* 110*   No results found for: CHOL, HDL, LDLCALC, TRIG No results found for: [facility]   Radiology Studies    Dg Chest 2 View  Result Date: 10/11/2017 CLINICAL DATA:  Central chest pressure and dyspnea since 6 p.m. EXAM: CHEST - 2 VIEW COMPARISON:  03/05/2011 FINDINGS: Stable cardiomegaly with aortic atherosclerosis. No aneurysm. Status post CABG. Chronic interstitial prominence without alveolar consolidation, effusion or pneumothorax. No acute osseous abnormality. IMPRESSION: 1. Chronic interstitial prominence without acute pulmonary disease. 2. Cardiomegaly with aortic atherosclerosis. Stable post CABG change. Electronically Signed   By: Ashley Royalty M.D.   On: 10/11/2017 21:34    ECG & Cardiac Imaging    EKG:  The EKG was personally reviewed and demonstrates SR with old Q wave in lead III  Assessment & Plan    72 yo male with PMH of CAD s/p PCI-RCA ('89), PCI-LAD/OM  ('92) 3v CABG ('11), HL, HTN, DM, TIA and left cerebellar stroke with carotid artery disease who presented with new on set Aflutter RVR and chest pain.   1. New onset Atrial Flutter: sudden onset of palpitations around 6pm last night. EKG confirmed Atrial Flutter with rate 140. Spontaneously converted to SR, no acute ischemia noted. No hx of the same. On BB therapy as outpatient. This patients CHA2DS2-VASc Score of at least 5. Will need long term [place] once plans for cath are determined. May be candidate for DOAC if renal function stabilizes.   2. NSTEMI: Initial Trop was neg, but increased from 5.16>>12.27>>25.47. Hx of CAD with CABG in 2011. Ideally would take for cath but baseline renal function appears to be close to 1.7-1.9. Cr 2.5>>2.8 this admission. Will need to watch for renal function to improve before proceeding with cath. Currently being treated with IVFs.Treat medically until, remains on IV heparin.  -- check echo -- follow EKGs and trop levels  3. CAD s/p 3v CABG ('11): Prior to last evening, he has not experienced any anginal symptoms.  -- continue ASA, statin, BB, fenofribate and plavix  4. DM: states he has been on metformin and insulin at home. Metformin held this admission. Will need to stop this at discharge given his renal function.  -- check Hgb A1c  5. HL: has been intolerant to statins in the past. Tolerating pravastatin.  -- check lipids  6. HTN: controlled.  Barnet Pall, NP-C Pager [phone] 10/12/2017, 9:52 AM

## 2017-10-12 NOTE — Progress Notes (Signed)
PROGRESS NOTE    Marvin Salinas  GLO:[id number] DOB: 1945/10/29 DOA: 10/11/2017 PCP: Aletha Halim., PA-C   Outpatient Specialists:     Brief Narrative:  Marvin Salinas is a 72 y.o. male with medical history significant of HTN, HLD, CAD s/p CABG/PCI, and DM type II;  presents with complaints of chest pain. Troponin elevated to 25 and cards following with plans for cath.       Assessment & Plan:   Principal Problem:   NSTEMI (non-ST elevated myocardial infarction) ([facility]) Active Problems:   S/P CABG x 3   Tobacco use disorder   Diabetes mellitus, type 2 (HCC)   Chest pain   Acute kidney injury superimposed on chronic kidney disease ([place])   NSTEMI, Chest pain: Acute.  Patient presents with acute onset of chest pain.  Heart score at least 6 (Hx,Age,Risk factors).  -troponin trended up to 25 -  Heparin gtt per pharmacy - Nitroglycerin as needed for chest pain - LDL > 70 - cardiology evaluation appreciated: plan for cath once renal function stablizes  Paroxysmal atrial flutter: Patient presents with heart rates into the 160s seem to be atrial flutter/atrial fibrillation.  Patient spontaneously converted without need of intervention.  Review of records shows that patient has documentation noting cardioversion, but patient denies any history of known atrial fibrillation before.CHADsVASc score = at least 3.  Patient currently on Plavix and aspirin, question need to switch to anticoagulation. - Continue metoprolol, may need dose adjusted -defer to cards  Leukocytosis: Acute.  Initial WBC elevated at 13.4.  Patient reports being in his normal state of health prior to admission.   -trending down-- reactive?  H/O CAD: Patient status post three-vessel CABG in 2011 and prior stenting. - Continue Plavix and aspirin  Acute kidney injury superimposed chronic kidney disease stage III: Patient presents with creatinine of 2.5 and BUN 24. Baseline creatinine previously had been around  1.7-2 per review of records on care everywhere.  Question hypoperfusion secondary to arrhythmia vs dehydration given patient being outside in the heat gardening.   -recheck in AM after gentle IVF   Essential hypertension - Hold Benicar due to kidney function - Continue metoprolol   Diabetes mellitus type 2: Patient on long-term insulin along with metformin. - Hypoglycemic protocols - STOP metformin - Decreased Lantus dose while in hospital - SSI  Dyslipidemia - LDL 111-- on pravastatin due to intolerance  Tobacco abuse: Patient reports smoking 5 cigarettes/day on average currently. - Counseled on need of cessation of tobacco use     DVT prophylaxis:  Fully anticoagulated   Code Status: Full Code   Family Communication: None at bedside  Disposition Plan:     Consultants:   cards     Subjective: No further chest pain  Objective: Vitals:   10/12/17 1145 10/12/17 1215 10/12/17 1230 10/12/17 1329  BP: 133/71 (!) 142/82 121/68 118/64  Pulse: (!) 56 (!) 56 (!) 56 (!) 53  Resp:    20  Temp:      TempSrc:      SpO2: 99% 97% 94% 99%  Weight:      Height:        Intake/Output Summary (Last 24 hours) at 10/12/2017 1350 Last data filed at 10/12/2017 1311 Gross per 24 hour  Intake -  Output 275 ml  Net -275 ml   Filed Weights   10/11/17 1946  Weight: 83.9 kg (185 lb)    Examination:  General exam: Appears calm and comfortable  Respiratory  system: Clear to auscultation. Respiratory effort normal. Cardiovascular system: S1 & S2 heard, RRR. No JVD, murmurs, rubs, gallops or clicks. No pedal edema. Gastrointestinal system: Abdomen is nondistended, soft and nontender. No organomegaly or masses felt. Normal bowel sounds heard. Central nervous system: Alert and oriented. No focal neurological deficits. Extremities: Symmetric 5 x 5 power. Skin: No rashes, lesions or ulcers Psychiatry: Judgement and insight appear normal. Mood & affect appropriate.      Data Reviewed: I have personally reviewed following labs and imaging studies  CBC: Recent Labs  Lab 10/11/17 1954 10/12/17 0355  WBC 13.4* 11.2*  NEUTROABS  --  7.5  HGB 15.1 13.3  HCT 43.6 39.1  MCV 89.7 89.5  PLT 257 381   Basic Metabolic Panel: Recent Labs  Lab 10/11/17 1954 10/12/17 0355  NA 143 141  K 4.6 4.1  CL 107 109  CO2 23 21*  GLUCOSE 150* 110*  BUN 24* 27*  CREATININE 2.50* 2.83*  CALCIUM 10.3 9.3  MG 2.2  --    GFR: Estimated Creatinine Clearance: 24.9 mL/min (A) (by C-G formula based on SCr of 2.83 mg/dL (H)). Liver Function Tests: Recent Labs  Lab 10/11/17 1954  AST 46*  ALT 35  ALKPHOS 49  BILITOT 0.6  PROT 7.1  ALBUMIN 4.5   No results for input(s): LIPASE, AMYLASE in the last 168 hours. No results for input(s): AMMONIA in the last 168 hours. Coagulation Profile: No results for input(s): INR, PROTIME in the last 168 hours. Cardiac Enzymes: Recent Labs  Lab 10/11/17 2302 10/12/17 0115 10/12/17 0355  TROPONINI 5.16* 12.27* 25.47*   BNP (last 3 results) No results for input(s): PROBNP in the last 8760 hours. HbA1C: No results for input(s): HGBA1C in the last 72 hours. CBG: Recent Labs  Lab 10/11/17 2328  GLUCAP 201*   Lipid Profile: Recent Labs    10/12/17 0411  CHOL 171  HDL 42  LDLCALC 111*  TRIG 92  CHOLHDL 4.1   Thyroid Function Tests: Recent Labs    10/11/17 2054  TSH 1.498   Anemia Panel: No results for input(s): VITAMINB12, FOLATE, FERRITIN, TIBC, IRON, RETICCTPCT in the last 72 hours. Urine analysis:    Component Value Date/Time   COLORURINE YELLOW 10/12/2017 0031   APPEARANCEUR HAZY (A) 10/12/2017 0031   LABSPEC 1.021 10/12/2017 0031   PHURINE 5.0 10/12/2017 0031   GLUCOSEU NEGATIVE 10/12/2017 0031   HGBUR NEGATIVE 10/12/2017 0031   BILIRUBINUR NEGATIVE 10/12/2017 0031   KETONESUR 5 (A) 10/12/2017 0031   PROTEINUR 30 (A) 10/12/2017 0031   UROBILINOGEN 0.2 06/16/2014 1211   NITRITE NEGATIVE  10/12/2017 0031   LEUKOCYTESUR NEGATIVE 10/12/2017 0031    )No results found for this or any previous visit (from the past 240 hour(s)).    Anti-infectives (From admission, onward)   None       Radiology Studies: Dg Chest 2 View  Result Date: 10/11/2017 CLINICAL DATA:  Central chest pressure and dyspnea since 6 p.m. EXAM: CHEST - 2 VIEW COMPARISON:  03/05/2011 FINDINGS: Stable cardiomegaly with aortic atherosclerosis. No aneurysm. Status post CABG. Chronic interstitial prominence without alveolar consolidation, effusion or pneumothorax. No acute osseous abnormality. IMPRESSION: 1. Chronic interstitial prominence without acute pulmonary disease. 2. Cardiomegaly with aortic atherosclerosis. Stable post CABG change. Electronically Signed   By: Ashley Royalty M.D.   On: 10/11/2017 21:34   US Renal  Result Date: 10/12/2017 CLINICAL DATA:  Acute kidney injury. Chronic kidney disease stage 3. EXAM: RENAL / URINARY TRACT ULTRASOUND COMPLETE  COMPARISON:  CT AP 06/16/2014. FINDINGS: Right Kidney: Length: 11.6 cm. Mild perinephric fluid around the inferior pole noted. Echogenicity within normal limits. No mass or hydronephrosis visualized. Left Kidney: Length: 12.2 cm. Echogenicity within normal limits. No mass or hydronephrosis visualized. Bladder: Appears normal for degree of bladder distention. Other:  Small volume of perihepatic ascites. IMPRESSION: 1. Normal cortical echogenicity of both kidneys without hydronephrosis. 2. Mild perinephric fluid surrounds the lower pole of the right kidney, which may be related to recent acute kidney injury. Electronically Signed   By: Kerby Moors M.D.   On: 10/12/2017 12:20        Scheduled Meds: . aspirin  81 mg Oral Daily  . clopidogrel  75 mg Oral Daily  . fenofibrate  160 mg Oral Daily  . insulin glargine  15 Units Subcutaneous BID  . metoprolol tartrate  25 mg Oral BID  . pravastatin  80 mg Oral QHS   Continuous Infusions: . sodium chloride 75  mL/hr at 10/12/17 0641  . heparin 1,100 Units/hr (10/12/17 0121)     LOS: 0 days    Time spent: 35 min    Geradine Girt, DO [facility] Pager [phone]  If 7PM-7AM, please contact night-coverage [email or web address] Password [email or web address] 10/12/2017, 1:50 PM

## 2017-10-12 NOTE — Progress Notes (Signed)
  Echocardiogram 2D Echocardiogram has been performed.  Jennette Dubin 10/12/2017, 2:50 PM

## 2017-10-12 NOTE — ED Notes (Signed)
Heart Healthy/Carb Modified diet lunch tray ordered.  

## 2017-10-12 NOTE — ED Notes (Signed)
US at bedside

## 2017-10-13 ENCOUNTER — Inpatient Hospital Stay (HOSPITAL_COMMUNITY)
Admission: EM | Disposition: A | Discharge status: Home / Self Care | Payer: Self-pay | Source: Home / Self Care | Attending: Internal Medicine

## 2017-10-13 DIAGNOSIS — I472 Ventricular tachycardia: Secondary | ICD-10-CM

## 2017-10-13 DIAGNOSIS — Z951 Presence of aortocoronary bypass graft: Secondary | ICD-10-CM

## 2017-10-13 DIAGNOSIS — F17200 Nicotine dependence, unspecified, uncomplicated: Secondary | ICD-10-CM

## 2017-10-13 DIAGNOSIS — I4892 Unspecified atrial flutter: Secondary | ICD-10-CM | POA: Diagnosis present

## 2017-10-13 DIAGNOSIS — R072 Precordial pain: Secondary | ICD-10-CM

## 2017-10-13 DIAGNOSIS — I4729 Other ventricular tachycardia: Secondary | ICD-10-CM

## 2017-10-13 DIAGNOSIS — I10 Essential (primary) hypertension: Secondary | ICD-10-CM

## 2017-10-13 HISTORY — PX: LEFT HEART CATH AND CORS/GRAFTS ANGIOGRAPHY: CATH118250

## 2017-10-13 LAB — CBC
HCT: 38.4 % — ABNORMAL LOW (ref 39.0–52.0)
Hemoglobin: 12.7 g/dL — ABNORMAL LOW (ref 13.0–17.0)
MCH: 30 pg (ref 26.0–34.0)
MCHC: 33.1 g/dL (ref 30.0–36.0)
MCV: 90.8 fL (ref 78.0–100.0)
Platelets: 225 10*3/uL (ref 150–400)
RBC: 4.23 MIL/uL (ref 4.22–5.81)
RDW: 13.9 % (ref 11.5–15.5)
WBC: 6.1 10*3/uL (ref 4.0–10.5)

## 2017-10-13 LAB — BASIC METABOLIC PANEL
Anion gap: 8 (ref 5–15)
BUN: 24 mg/dL — ABNORMAL HIGH (ref 6–20)
CO2: 22 mmol/L (ref 22–32)
Calcium: 8.9 mg/dL (ref 8.9–10.3)
Chloride: 108 mmol/L (ref 101–111)
Creatinine, Ser: 1.86 mg/dL — ABNORMAL HIGH (ref 0.61–1.24)
GFR calc Af Amer: 40 mL/min — ABNORMAL LOW (ref >60)
GFR calc non Af Amer: 35 mL/min — ABNORMAL LOW (ref >60)
Glucose, Bld: 116 mg/dL — ABNORMAL HIGH (ref 65–99)
Potassium: 3.9 mmol/L (ref 3.5–5.1)
Sodium: 138 mmol/L (ref 135–145)

## 2017-10-13 LAB — GLUCOSE, CAPILLARY
Glucose-Capillary: 118 mg/dL — ABNORMAL HIGH (ref 65–99)
Glucose-Capillary: 108 mg/dL — ABNORMAL HIGH (ref 65–99)
Glucose-Capillary: 85 mg/dL (ref 65–99)
Glucose-Capillary: 196 mg/dL — ABNORMAL HIGH (ref 65–99)

## 2017-10-13 LAB — HEPARIN LEVEL (UNFRACTIONATED): Heparin Unfractionated: 0.36 IU/mL (ref 0.30–0.70)

## 2017-10-13 LAB — UREA NITROGEN, URINE: Urea Nitrogen, Ur: 680 mg/dL

## 2017-10-13 SURGERY — LEFT HEART CATH AND CORS/GRAFTS ANGIOGRAPHY
Anesthesia: LOCAL

## 2017-10-13 MED ORDER — LIDOCAINE HCL (PF) 1 % IJ SOLN
INTRAMUSCULAR | Status: AC
  Filled 2017-10-13: qty 30

## 2017-10-13 MED ORDER — HEPARIN (PORCINE) IN NACL 100-0.45 UNIT/ML-% IJ SOLN
1100.0000 [IU]/h | INTRAMUSCULAR | Status: DC
  Administered 2017-10-13: 1100 [IU]/h via INTRAVENOUS
  Filled 2017-10-13: qty 250

## 2017-10-13 MED ORDER — INSULIN ASPART 100 UNIT/ML ~~LOC~~ SOLN
0.0000 [IU] | Freq: Three times a day (TID) | SUBCUTANEOUS | Status: DC
  Administered 2017-10-14: 2 [IU] via SUBCUTANEOUS

## 2017-10-13 MED ORDER — EZETIMIBE 10 MG PO TABS
10.0000 mg | ORAL_TABLET | Freq: Every day | ORAL | Status: DC
  Administered 2017-10-14: 10 mg via ORAL
  Filled 2017-10-13: qty 1

## 2017-10-13 MED ORDER — HEPARIN (PORCINE) IN NACL 2-0.9 UNITS/ML
INTRAMUSCULAR | Status: AC | PRN
  Administered 2017-10-13 (×2): 500 mL

## 2017-10-13 MED ORDER — HEPARIN SODIUM (PORCINE) 1000 UNIT/ML IJ SOLN
INTRAMUSCULAR | Status: DC | PRN
  Administered 2017-10-13: 4000 [IU] via INTRAVENOUS

## 2017-10-13 MED ORDER — LIDOCAINE HCL (PF) 1 % IJ SOLN
INTRAMUSCULAR | Status: DC | PRN
  Administered 2017-10-13: 2 mL

## 2017-10-13 MED ORDER — FENTANYL CITRATE (PF) 100 MCG/2ML IJ SOLN
INTRAMUSCULAR | Status: AC
Start: 2017-10-13 — End: 2017-10-13
  Filled 2017-10-13: qty 2

## 2017-10-13 MED ORDER — VERAPAMIL HCL 2.5 MG/ML IV SOLN
INTRAVENOUS | Status: AC
  Filled 2017-10-13: qty 2

## 2017-10-13 MED ORDER — SODIUM CHLORIDE 0.9 % IV SOLN: 250.0000 mL | INTRAVENOUS | Status: DC | PRN

## 2017-10-13 MED ORDER — HEPARIN SODIUM (PORCINE) 1000 UNIT/ML IJ SOLN
INTRAMUSCULAR | Status: AC
  Filled 2017-10-13: qty 1

## 2017-10-13 MED ORDER — MIDAZOLAM HCL 2 MG/2ML IJ SOLN
INTRAMUSCULAR | Status: AC
  Filled 2017-10-13: qty 2

## 2017-10-13 MED ORDER — SODIUM CHLORIDE 0.9% FLUSH
3.0000 mL | Freq: Two times a day (BID) | INTRAVENOUS | Status: DC
  Administered 2017-10-13: 3 mL via INTRAVENOUS

## 2017-10-13 MED ORDER — SODIUM CHLORIDE 0.9 % WEIGHT BASED INFUSION
1.0000 mL/kg/h | INTRAVENOUS | Status: DC
  Administered 2017-10-13: 1 mL/kg/h via INTRAVENOUS

## 2017-10-13 MED ORDER — IOPAMIDOL (ISOVUE-370) INJECTION 76%
INTRAVENOUS | Status: DC | PRN
  Administered 2017-10-13: 53 mL

## 2017-10-13 MED ORDER — IOPAMIDOL (ISOVUE-370) INJECTION 76%
INTRAVENOUS | Status: AC
  Filled 2017-10-13: qty 125

## 2017-10-13 MED ORDER — MIDAZOLAM HCL 2 MG/2ML IJ SOLN
INTRAMUSCULAR | Status: DC | PRN
  Administered 2017-10-13: 1 mg via INTRAVENOUS

## 2017-10-13 MED ORDER — SODIUM CHLORIDE 0.9% FLUSH: 3.0000 mL | INTRAVENOUS | Status: DC | PRN

## 2017-10-13 MED ORDER — SODIUM CHLORIDE 0.9 % WEIGHT BASED INFUSION
3.0000 mL/kg/h | INTRAVENOUS | Status: DC
  Administered 2017-10-13: 3 mL/kg/h via INTRAVENOUS

## 2017-10-13 MED ORDER — HEPARIN (PORCINE) IN NACL 1000-0.9 UT/500ML-% IV SOLN
INTRAVENOUS | Status: AC
  Filled 2017-10-13: qty 1000

## 2017-10-13 MED ORDER — FENTANYL CITRATE (PF) 100 MCG/2ML IJ SOLN
INTRAMUSCULAR | Status: DC | PRN
  Administered 2017-10-13: 25 ug via INTRAVENOUS

## 2017-10-13 MED ORDER — VERAPAMIL HCL 2.5 MG/ML IV SOLN
INTRAVENOUS | Status: DC | PRN
  Administered 2017-10-13: 10 mL via INTRA_ARTERIAL

## 2017-10-13 SURGICAL SUPPLY — 15 items
BAND CMPR LRG ZPHR (HEMOSTASIS) ×1
BAND ZEPHYR COMPRESS 30 LONG (HEMOSTASIS) ×1 IMPLANT
CATH INFINITI 5 FR IM (CATHETERS) ×1 IMPLANT
CATH INFINITI 5 FR MPA2 (CATHETERS) ×1 IMPLANT
CATH INFINITI 5FR MULTPACK ANG (CATHETERS) ×1 IMPLANT
ELECT DEFIB PAD ADLT CADENCE (PAD) ×1 IMPLANT
GUIDEWIRE INQWIRE 1.5J.035X260 (WIRE) IMPLANT
INQWIRE 1.5J .035X260CM (WIRE) ×2
KIT HEART LEFT (KITS) ×2 IMPLANT
NDL PERC 21GX4CM (NEEDLE) IMPLANT
NEEDLE PERC 21GX4CM (NEEDLE) ×2 IMPLANT
PACK CARDIAC CATHETERIZATION (CUSTOM PROCEDURE TRAY) ×2 IMPLANT
SHEATH RAIN RADIAL 21G 6FR (SHEATH) ×1 IMPLANT
TRANSDUCER W/STOPCOCK (MISCELLANEOUS) ×2 IMPLANT
TUBING CIL FLEX 10 FLL-RA (TUBING) ×2 IMPLANT

## 2017-10-13 NOTE — Progress Notes (Signed)
Progress Note  Patient Name: Marvin Salinas Date of Encounter: 10/13/2017  Primary Cardiologist: No primary care provider on file.  Subjective   Feeling well this morning.   Inpatient Medications    Scheduled Meds: . aspirin  81 mg Oral Daily  . clopidogrel  75 mg Oral Daily  . fenofibrate  160 mg Oral Daily  . insulin aspart  0-5 Units Subcutaneous QHS  . insulin aspart  0-9 Units Subcutaneous TID WC  . insulin glargine  15 Units Subcutaneous BID  . metoprolol tartrate  12.5 mg Oral BID  . pravastatin  80 mg Oral QHS   Continuous Infusions: . sodium chloride 75 mL/hr at 10/12/17 2122  . heparin 1,100 Units/hr (10/12/17 2122)   PRN Meds: acetaminophen, fluticasone, gi cocktail, guaiFENesin-dextromethorphan, ipratropium-albuterol, lip balm, morphine injection, nitroGLYCERIN, ondansetron (ZOFRAN) IV, phenol, polyvinyl alcohol, sodium chloride   Vital Signs    Vitals:   10/12/17 1329 10/12/17 1932 10/13/17 0017 10/13/17 0400  BP: 118/64 136/71 119/69 139/60  Pulse: (!) 53 (!) 53 (!) 54 (!) 56  Resp: 20 18 18 18   Temp:  97.9 F (36.6 C) (!) 97.5 F (36.4 C) 99 F (37.2 C)  TempSrc:  Oral Oral Oral  SpO2: 99% 98% 99% 100%  Weight:    186 lb 3.2 oz (84.5 kg)  Height:        Intake/Output Summary (Last 24 hours) at 10/13/2017 0854 Last data filed at 10/13/2017 6812 Gross per 24 hour  Intake 2525.9 ml  Output 2025 ml  Net 500.9 ml   Filed Weights   10/11/17 1946 10/12/17 1300 10/13/17 0400  Weight: 185 lb (83.9 kg) 186 lb (84.4 kg) 186 lb 3.2 oz (84.5 kg)    Telemetry    SB with short run of NSVT- Personally Reviewed  ECG    N/a - Personally Reviewed  Physical Exam   General: Well developed, well nourished, male appearing in no acute distress. Head: Normocephalic, atraumatic.  Neck: Supple without bruits, JVD. Lungs:  Resp regular and unlabored, CTA. Heart: RRR, S1, S2, no S3, S4, or murmur; no rub. Abdomen: Soft, non-tender, non-distended with  normoactive bowel sounds. No hepatomegaly. No rebound/guarding. No obvious abdominal masses. Extremities: No clubbing, cyanosis, edema. Distal pedal pulses are 2+ bilaterally. Neuro: Alert and oriented X 3. Moves all extremities spontaneously. Psych: Normal affect.  Labs    Chemistry Recent Labs  Lab 10/11/17 1954 10/12/17 0355 10/13/17 0738  NA 143 141 138  K 4.6 4.1 3.9  CL 107 109 108  CO2 23 21* 22  GLUCOSE 150* 110* 116*  BUN 24* 27* 24*  CREATININE 2.50* 2.83* 1.86*  CALCIUM 10.3 9.3 8.9  PROT 7.1  --   --   ALBUMIN 4.5  --   --   AST 46*  --   --   ALT 35  --   --   ALKPHOS 49  --   --   BILITOT 0.6  --   --   GFRNONAA 24* 21* 35*  GFRAA 28* 24* 40*  ANIONGAP 13 11 8      Hematology Recent Labs  Lab 10/11/17 1954 10/12/17 0355 10/13/17 0356  WBC 13.4* 11.2* 6.1  RBC 4.86 4.37 4.23  HGB 15.1 13.3 12.7*  HCT 43.6 39.1 38.4*  MCV 89.7 89.5 90.8  MCH 31.1 30.4 30.0  MCHC 34.6 34.0 33.1  RDW 13.6 13.7 13.9  PLT 257 245 225    Cardiac Enzymes Recent Labs  Lab 10/11/17 2302 10/12/17  0115 10/12/17 0355  TROPONINI 5.16* 12.27* 25.47*    Recent Labs  Lab 10/11/17 2006  TROPIPOC 0.00     BNP Recent Labs  Lab 10/11/17 1954  BNP 140.6*     DDimer No results for input(s): DDIMER in the last 168 hours.    Radiology    Dg Chest 2 View  Result Date: 10/11/2017 CLINICAL DATA:  Central chest pressure and dyspnea since 6 p.m. EXAM: CHEST - 2 VIEW COMPARISON:  03/05/2011 FINDINGS: Stable cardiomegaly with aortic atherosclerosis. No aneurysm. Status post CABG. Chronic interstitial prominence without alveolar consolidation, effusion or pneumothorax. No acute osseous abnormality. IMPRESSION: 1. Chronic interstitial prominence without acute pulmonary disease. 2. Cardiomegaly with aortic atherosclerosis. Stable post CABG change. Electronically Signed   By: Ashley Royalty M.D.   On: 10/11/2017 21:34   US Renal  Result Date: 10/12/2017 CLINICAL DATA:  Acute  kidney injury. Chronic kidney disease stage 3. EXAM: RENAL / URINARY TRACT ULTRASOUND COMPLETE COMPARISON:  CT AP 06/16/2014. FINDINGS: Right Kidney: Length: 11.6 cm. Mild perinephric fluid around the inferior pole noted. Echogenicity within normal limits. No mass or hydronephrosis visualized. Left Kidney: Length: 12.2 cm. Echogenicity within normal limits. No mass or hydronephrosis visualized. Bladder: Appears normal for degree of bladder distention. Other:  Small volume of perihepatic ascites. IMPRESSION: 1. Normal cortical echogenicity of both kidneys without hydronephrosis. 2. Mild perinephric fluid surrounds the lower pole of the right kidney, which may be related to recent acute kidney injury. Electronically Signed   By: Kerby Moors M.D.   On: 10/12/2017 12:20    Cardiac Studies   TTE: 10/12/17  Study Conclusions  - Left ventricle: The cavity size was normal. Wall thickness was   increased in a pattern of mild LVH. Systolic function was normal.   The estimated ejection fraction was in the range of 55% to 60%.   Wall motion was normal; there were no regional wall motion   abnormalities. Left ventricular diastolic function parameters   were normal.  Impressions:  - Normal LV systolic function; mild diastolic dysfunction; mild   LVH.  Patient Profile     72 y.o. male with PMH of CAD s/p PCI-RCA ('89), PCI-LAD/OM ('92) 3v CABG ('11), HL, HTN, DM, TIA and left cerebellar stroke with carotid artery disease who presented with new on set Aflutter RVR and chest pain.   Assessment & Plan    1. New onset Atrial Flutter: sudden onset of palpitations around 6pm the night of admission. EKG confirmed Atrial Flutter with rate 140. Spontaneously converted to SR, no acute ischemia noted. No hx of the same. On BB therapy as outpatient. This patients CHA2DS2-VASc Score of at least 5. Will need long term [place] once plans for cath are determined. May be candidate for DOAC if renal function stabilizes.     2. NSTEMI: Initial Trop was neg, but increased from 5.16>>12.27>>25.47. Hx of CAD with CABG in 2011. Ideally would take for cath but baseline renal function appears to be close to 1.7-1.9. Cr 2.5>>2.8 this admission. Cr back down to 1.8 today. Will plan for cath today, give pre-hydration fluids.  -- echo with normal EF and mild diastolic dysfunction  3. CAD s/p 3v CABG ('11): Prior to last evening, he has not experienced any anginal symptoms.  -- continue ASA, statin, BB, fenofribate and plavix  4. DM: states he has been on metformin and insulin at home. Metformin held this admission. Will need to stop this at discharge given his renal function.  --  check Hgb A1c  5. HL: has been intolerant to statins in the past. Tolerating pravastatin.  --  LDL still above goal at 111. May be a candidate for PCSK9s as outpatient.   6. HTN: controlled.   Signed, Reino Bellis, NP  10/13/2017, 8:54 AM  Pager # [phone]   For questions or updates, please contact [place] Please consult [email or web address] for contact info under Cardiology/STEMI.

## 2017-10-13 NOTE — Progress Notes (Signed)
ANTICOAGULATION CONSULT NOTE - Follow Up Consult  Pharmacy Consult for Heparin Indication: atrial fibrillation, NSTEMI  Allergies  Allergen Reactions  . Codeine Nausea And Vomiting  . Lisinopril Cough    Other reaction(s): Cough (ALLERGY/intolerance)  . Nsaids     Other reaction(s): Other (See Comments) CKD Other reaction(s): Other CKD  . Latex Hives   Patient Measurements: Height: 5\' 8"  (172.7 cm) Weight: 186 lb 3.2 oz (84.5 kg) IBW/kg (Calculated) : 68.4 Heparin Dosing Weight:  84.4 kg  Vital Signs: Temp: 99 F (37.2 C) (04/25 0400) Temp Source: Oral (04/25 0400) BP: 139/60 (04/25 0400) Pulse Rate: 56 (04/25 0400)  Labs: Recent Labs    10/11/17 1954 10/11/17 2302 10/12/17 0115 10/12/17 0355 10/12/17 0927 10/12/17 1650 10/13/17 0356 10/13/17 0738  HGB 15.1  --   --  13.3  --   --  12.7*  --   HCT 43.6  --   --  39.1  --   --  38.4*  --   PLT 257  --   --  245  --   --  225  --   HEPARINUNFRC  --   --   --   --  0.66 0.44 0.36  --   CREATININE 2.50*  --   --  2.83*  --   --   --  1.86*  TROPONINI  --  5.16* 12.27* 25.47*  --   --   --   --    Estimated Creatinine Clearance: 38 mL/min (A) (by C-G formula based on SCr of 1.86 mg/dL (H)).  Assessment:  Anticoag: hep for afib/flutter, NSTEMI.  no AC PTA. Cardiology considering cardiac cath once renal function improves. Cbc wnl.   Heparin level therapeutic: 0.36   Goal of Therapy:  Heparin level 0.3-0.7 units/ml Monitor platelets by anticoagulation protocol: Yes   Plan:  Continue heparin drip at 1100 units/hr Monitor daily heparin level and CBC F/U need for oral anticoagulation if afib continues   Georga Bora, PharmD Clinical Pharmacist 10/13/2017 8:39 AM

## 2017-10-13 NOTE — H&P (View-Only) (Signed)
Progress Note  Patient Name: Marvin Salinas Date of Encounter: 10/13/2017  Primary Cardiologist: No primary care provider on file.  Subjective   Feeling well this morning.   Inpatient Medications    Scheduled Meds: . aspirin  81 mg Oral Daily  . clopidogrel  75 mg Oral Daily  . fenofibrate  160 mg Oral Daily  . insulin aspart  0-5 Units Subcutaneous QHS  . insulin aspart  0-9 Units Subcutaneous TID WC  . insulin glargine  15 Units Subcutaneous BID  . metoprolol tartrate  12.5 mg Oral BID  . pravastatin  80 mg Oral QHS   Continuous Infusions: . sodium chloride 75 mL/hr at 10/12/17 2122  . heparin 1,100 Units/hr (10/12/17 2122)   PRN Meds: acetaminophen, fluticasone, gi cocktail, guaiFENesin-dextromethorphan, ipratropium-albuterol, lip balm, morphine injection, nitroGLYCERIN, ondansetron (ZOFRAN) IV, phenol, polyvinyl alcohol, sodium chloride   Vital Signs    Vitals:   10/12/17 1329 10/12/17 1932 10/13/17 0017 10/13/17 0400  BP: 118/64 136/71 119/69 139/60  Pulse: (!) 53 (!) 53 (!) 54 (!) 56  Resp: 20 18 18 18   Temp:  97.9 F (36.6 C) (!) 97.5 F (36.4 C) 99 F (37.2 C)  TempSrc:  Oral Oral Oral  SpO2: 99% 98% 99% 100%  Weight:    186 lb 3.2 oz (84.5 kg)  Height:        Intake/Output Summary (Last 24 hours) at 10/13/2017 0854 Last data filed at 10/13/2017 9833 Gross per 24 hour  Intake 2525.9 ml  Output 2025 ml  Net 500.9 ml   Filed Weights   10/11/17 1946 10/12/17 1300 10/13/17 0400  Weight: 185 lb (83.9 kg) 186 lb (84.4 kg) 186 lb 3.2 oz (84.5 kg)    Telemetry    SB with short run of NSVT- Personally Reviewed  ECG    N/a - Personally Reviewed  Physical Exam   General: Well developed, well nourished, male appearing in no acute distress. Head: Normocephalic, atraumatic.  Neck: Supple without bruits, JVD. Lungs:  Resp regular and unlabored, CTA. Heart: RRR, S1, S2, no S3, S4, or murmur; no rub. Abdomen: Soft, non-tender, non-distended with  normoactive bowel sounds. No hepatomegaly. No rebound/guarding. No obvious abdominal masses. Extremities: No clubbing, cyanosis, edema. Distal pedal pulses are 2+ bilaterally. Neuro: Alert and oriented X 3. Moves all extremities spontaneously. Psych: Normal affect.  Labs    Chemistry Recent Labs  Lab 10/11/17 1954 10/12/17 0355 10/13/17 0738  NA 143 141 138  K 4.6 4.1 3.9  CL 107 109 108  CO2 23 21* 22  GLUCOSE 150* 110* 116*  BUN 24* 27* 24*  CREATININE 2.50* 2.83* 1.86*  CALCIUM 10.3 9.3 8.9  PROT 7.1  --   --   ALBUMIN 4.5  --   --   AST 46*  --   --   ALT 35  --   --   ALKPHOS 49  --   --   BILITOT 0.6  --   --   GFRNONAA 24* 21* 35*  GFRAA 28* 24* 40*  ANIONGAP 13 11 8      Hematology Recent Labs  Lab 10/11/17 1954 10/12/17 0355 10/13/17 0356  WBC 13.4* 11.2* 6.1  RBC 4.86 4.37 4.23  HGB 15.1 13.3 12.7*  HCT 43.6 39.1 38.4*  MCV 89.7 89.5 90.8  MCH 31.1 30.4 30.0  MCHC 34.6 34.0 33.1  RDW 13.6 13.7 13.9  PLT 257 245 225    Cardiac Enzymes Recent Labs  Lab 10/11/17 2302 10/12/17  0115 10/12/17 0355  TROPONINI 5.16* 12.27* 25.47*    Recent Labs  Lab 10/11/17 2006  TROPIPOC 0.00     BNP Recent Labs  Lab 10/11/17 1954  BNP 140.6*     DDimer No results for input(s): DDIMER in the last 168 hours.    Radiology    Dg Chest 2 View  Result Date: 10/11/2017 CLINICAL DATA:  Central chest pressure and dyspnea since 6 p.m. EXAM: CHEST - 2 VIEW COMPARISON:  03/05/2011 FINDINGS: Stable cardiomegaly with aortic atherosclerosis. No aneurysm. Status post CABG. Chronic interstitial prominence without alveolar consolidation, effusion or pneumothorax. No acute osseous abnormality. IMPRESSION: 1. Chronic interstitial prominence without acute pulmonary disease. 2. Cardiomegaly with aortic atherosclerosis. Stable post CABG change. Electronically Signed   By: Ashley Royalty M.D.   On: 10/11/2017 21:34   US Renal  Result Date: 10/12/2017 CLINICAL DATA:  Acute  kidney injury. Chronic kidney disease stage 3. EXAM: RENAL / URINARY TRACT ULTRASOUND COMPLETE COMPARISON:  CT AP 06/16/2014. FINDINGS: Right Kidney: Length: 11.6 cm. Mild perinephric fluid around the inferior pole noted. Echogenicity within normal limits. No mass or hydronephrosis visualized. Left Kidney: Length: 12.2 cm. Echogenicity within normal limits. No mass or hydronephrosis visualized. Bladder: Appears normal for degree of bladder distention. Other:  Small volume of perihepatic ascites. IMPRESSION: 1. Normal cortical echogenicity of both kidneys without hydronephrosis. 2. Mild perinephric fluid surrounds the lower pole of the right kidney, which may be related to recent acute kidney injury. Electronically Signed   By: Kerby Moors M.D.   On: 10/12/2017 12:20    Cardiac Studies   TTE: 10/12/17  Study Conclusions  - Left ventricle: The cavity size was normal. Wall thickness was   increased in a pattern of mild LVH. Systolic function was normal.   The estimated ejection fraction was in the range of 55% to 60%.   Wall motion was normal; there were no regional wall motion   abnormalities. Left ventricular diastolic function parameters   were normal.  Impressions:  - Normal LV systolic function; mild diastolic dysfunction; mild   LVH.  Patient Profile     72 y.o. male with PMH of CAD s/p PCI-RCA ('89), PCI-LAD/OM ('92) 3v CABG ('11), HL, HTN, DM, TIA and left cerebellar stroke with carotid artery disease who presented with new on set Aflutter RVR and chest pain.   Assessment & Plan    1. New onset Atrial Flutter: sudden onset of palpitations around 6pm the night of admission. EKG confirmed Atrial Flutter with rate 140. Spontaneously converted to SR, no acute ischemia noted. No hx of the same. On BB therapy as outpatient. This patients CHA2DS2-VASc Score of at least 5. Will need long term [place] once plans for cath are determined. May be candidate for DOAC if renal function stabilizes.     2. NSTEMI: Initial Trop was neg, but increased from 5.16>>12.27>>25.47. Hx of CAD with CABG in 2011. Ideally would take for cath but baseline renal function appears to be close to 1.7-1.9. Cr 2.5>>2.8 this admission. Cr back down to 1.8 today. Will plan for cath today, give pre-hydration fluids.  -- echo with normal EF and mild diastolic dysfunction  3. CAD s/p 3v CABG ('11): Prior to last evening, he has not experienced any anginal symptoms.  -- continue ASA, statin, BB, fenofribate and plavix  4. DM: states he has been on metformin and insulin at home. Metformin held this admission. Will need to stop this at discharge given his renal function.  --  check Hgb A1c  5. HL: has been intolerant to statins in the past. Tolerating pravastatin.  --  LDL still above goal at 111. May be a candidate for PCSK9s as outpatient.   6. HTN: controlled.   Signed, Reino Bellis, NP  10/13/2017, 8:54 AM  Pager # [phone]   For questions or updates, please contact [place] Please consult [email or web address] for contact info under Cardiology/STEMI.

## 2017-10-13 NOTE — Interval H&P Note (Signed)
History and Physical Interval Note:  10/13/2017 2:25 PM  Marvin Salinas  has presented today for cardiac catheterization, with the diagnosis of NSTEMI. The various methods of treatment have been discussed with the patient and family. After consideration of risks, benefits and other options for treatment, the patient has consented to  Procedure(s): LEFT HEART CATH AND CORS/GRAFTS ANGIOGRAPHY (N/A) as a surgical intervention .  The patient's history has been reviewed, patient examined, no change in status, stable for surgery.  I have reviewed the patient's chart and labs.  Questions were answered to the patient's satisfaction.    Cath Lab Visit (complete for each Cath Lab visit)  Clinical Evaluation Leading to the Procedure:   ACS: Yes.    Non-ACS:  NSTEMI  Marvin Salinas

## 2017-10-13 NOTE — Progress Notes (Signed)
Progress Note    Marvin Salinas  DJS:[id number] DOB: [birth year]  DOA: 10/11/2017 PCP: Aletha Halim., PA-C    Brief Narrative:   Chief complaint: Chest pain  Medical records reviewed and are as summarized below:  Marvin Salinas is an 71 y.o. male the PMH of hypertension, hyperlipidemia, CAD status post CABG/PCI and type 2 diabetes who was admitted 10/11/17 for evaluation of chest pain. Troponin elevated to 25.  Assessment/Plan:   Principal Problem:   Chest pain secondary to NSTEMI (non-ST elevated myocardial infarction) ([place]) in a patient with known CAD status post CABG 3 Chest x-ray personally reviewed and negative for acute findings. Cardiomegaly noted. Troponins markedly elevated to 25. Manage with heparin drip and when necessary nitroglycerin for active chest pain. Cardiology consulted with plans to proceed with cardiac catheterization once renal function stable. 2-D echo shows EF 55-60 percent with no regional wall motion abnormalities. Continue aspirin/Plavix.  Active Problems:   Paroxysmal atrial flutter Heart rate initially in the 160s. Spontaneously converted. Chads 2 Vasc score is 6. Currently on aspirin and Plavix. May need to switch to full anticoagulation, but currently on heparin. Continue metoprolol.    Tobacco use disorder Has been counseled.    Dyslipidemia LDL goal less than 70, currently 111. Continue pravastatin. Has been intolerant of other statins.    Acute kidney injury superimposed on chronic kidney disease (HCC) Initial creatinine was 2.5, baseline around 1. 7-2. Gently hydrated. Creatinine now back to baseline values of 1.86. Renal ultrasound negative for hydronephrosis.    Nonsustained ventricular tachycardia ([place]) New problem identified. Continue beta blocker and telemetry monitoring. Potassium and magnesium are within normal limits.    Essential hypertension Continue metoprolol. Benicar currently on hold secondary to bump in creatinine. We'll  continue to hold in anticipation of dye load from cardiac catheterization. Blood pressure currently controlled.    Type 2 diabetes with long-term insulin use Metformin on hold. Currently being managed with Lantus 15 units twice a day and insulin sensitive SSI. CBGs 118-201.  Body mass index is 28.31 kg/m.   Family Communication/Anticipated D/C date and plan/Code Status   DVT prophylaxis: Heparin ordered. Code Status: Full Code.  Family Communication: Wife updated the bedside. Disposition Plan: Home post cardiac cath if clear.   Medical Consultants:    Cardiology  Anti-Infectives:    None  Subjective:   Patient denies current active chest pain, shortness of breath, diaphoresis, presyncope.  Objective:    Vitals:   10/12/17 1329 10/12/17 1932 10/13/17 0017 10/13/17 0400  BP: 118/64 136/71 119/69 139/60  Pulse: (!) 53 (!) 53 (!) 54 (!) 56  Resp: 20 18 18 18   Temp:  97.9 F (36.6 C) (!) 97.5 F (36.4 C) 99 F (37.2 C)  TempSrc:  Oral Oral Oral  SpO2: 99% 98% 99% 100%  Weight:    84.5 kg (186 lb 3.2 oz)  Height:        Intake/Output Summary (Last 24 hours) at 10/13/2017 0835 Last data filed at 10/13/2017 0742 Gross per 24 hour  Intake 2525.9 ml  Output 2025 ml  Net 500.9 ml   Filed Weights   10/11/17 1946 10/12/17 1300 10/13/17 0400  Weight: 83.9 kg (185 lb) 84.4 kg (186 lb) 84.5 kg (186 lb 3.2 oz)    Exam: General: No acute distress. Cardiovascular: Heart sounds show a regular rate, and rhythm. No gallops or rubs. No murmurs. No JVD. Lungs: Clear to auscultation bilaterally with good air movement. No rales, rhonchi  or wheezes. Abdomen: Soft, nontender, nondistended with normal active bowel sounds. No masses. No hepatosplenomegaly. Neurological: Alert and oriented 3. Moves all extremities 4 with equal strength. Cranial nerves II through XII grossly intact. Skin: Warm and dry. No rashes or lesions. Extremities: No clubbing or cyanosis. No edema. Pedal  pulses 2+. Psychiatric: Mood and affect are normal. Insight and judgment are good.   Data Reviewed:   I have personally reviewed following labs and imaging studies:  Labs: Labs show the following:   Basic Metabolic Panel: Recent Labs  Lab 10/11/17 1954 10/12/17 0355 10/13/17 0738  NA 143 141 138  K 4.6 4.1 3.9  CL 107 109 108  CO2 23 21* 22  GLUCOSE 150* 110* 116*  BUN 24* 27* 24*  CREATININE 2.50* 2.83* 1.86*  CALCIUM 10.3 9.3 8.9  MG 2.2  --   --    GFR Estimated Creatinine Clearance: 38 mL/min (A) (by C-G formula based on SCr of 1.86 mg/dL (H)). Liver Function Tests: Recent Labs  Lab 10/11/17 1954  AST 46*  ALT 35  ALKPHOS 49  BILITOT 0.6  PROT 7.1  ALBUMIN 4.5    CBC: Recent Labs  Lab 10/11/17 1954 10/12/17 0355 10/13/17 0356  WBC 13.4* 11.2* 6.1  NEUTROABS  --  7.5  --   HGB 15.1 13.3 12.7*  HCT 43.6 39.1 38.4*  MCV 89.7 89.5 90.8  PLT 257 245 225   Cardiac Enzymes: Recent Labs  Lab 10/11/17 2302 10/12/17 0115 10/12/17 0355  TROPONINI 5.16* 12.27* 25.47*   CBG: Recent Labs  Lab 10/11/17 2328 10/12/17 1548 10/12/17 10-11-11 10/13/17 0755  GLUCAP 201* 146* 193* 118*   Lipid Profile: Recent Labs    10/12/17 0411  CHOL 171  HDL 42  LDLCALC 111*  TRIG 92  CHOLHDL 4.1   Thyroid function studies: Recent Labs    10/11/17 Oct 10, 2052  TSH 1.498   Sepsis Labs: Recent Labs  Lab 10/11/17 1954 10/12/17 0355 10/13/17 0356  WBC 13.4* 11.2* 6.1    Microbiology No results found for this or any previous visit (from the past 240 hour(s)).  Procedures and diagnostic studies:  Dg Chest 2 View  Result Date: 10/11/2017 CLINICAL DATA:  Central chest pressure and dyspnea since 6 p.m. EXAM: CHEST - 2 VIEW COMPARISON:  03/05/2011 FINDINGS: Stable cardiomegaly with aortic atherosclerosis. No aneurysm. Status post CABG. Chronic interstitial prominence without alveolar consolidation, effusion or pneumothorax. No acute osseous abnormality. IMPRESSION:  1. Chronic interstitial prominence without acute pulmonary disease. 2. Cardiomegaly with aortic atherosclerosis. Stable post CABG change. Electronically Signed   By: Ashley Royalty M.D.   On: 10/11/2017 21:34   US Renal  Result Date: 10/12/2017 CLINICAL DATA:  Acute kidney injury. Chronic kidney disease stage 3. EXAM: RENAL / URINARY TRACT ULTRASOUND COMPLETE COMPARISON:  CT AP 06/16/2014. FINDINGS: Right Kidney: Length: 11.6 cm. Mild perinephric fluid around the inferior pole noted. Echogenicity within normal limits. No mass or hydronephrosis visualized. Left Kidney: Length: 12.2 cm. Echogenicity within normal limits. No mass or hydronephrosis visualized. Bladder: Appears normal for degree of bladder distention. Other:  Small volume of perihepatic ascites. IMPRESSION: 1. Normal cortical echogenicity of both kidneys without hydronephrosis. 2. Mild perinephric fluid surrounds the lower pole of the right kidney, which may be related to recent acute kidney injury. Electronically Signed   By: Kerby Moors M.D.   On: 10/12/2017 12:20    Medications:   . aspirin  81 mg Oral Daily  . clopidogrel  75 mg Oral Daily  .  fenofibrate  160 mg Oral Daily  . insulin aspart  0-5 Units Subcutaneous QHS  . insulin aspart  0-9 Units Subcutaneous TID WC  . insulin glargine  15 Units Subcutaneous BID  . metoprolol tartrate  12.5 mg Oral BID  . pravastatin  80 mg Oral QHS   Continuous Infusions: . sodium chloride 75 mL/hr at 10/12/17 2122  . heparin 1,100 Units/hr (10/12/17 2122)     LOS: 1 day   Jacquelynn Cree  [facility] Pager [phone]. If unable to reach me by pager, please call my cell phone at [phone].  *Please refer to [email or web address], password [email or web address] to get updated schedule on who will round on this patient, as hospitalists switch teams weekly. If 7PM-7AM, please contact night-coverage at [email or web address], password [email or web address] for any overnight needs.  10/13/2017, 8:35 AM

## 2017-10-13 NOTE — Progress Notes (Signed)
ANTICOAGULATION CONSULT NOTE - Follow Up Consult  Pharmacy Consult for Heparin Indication: atrial fibrillation, NSTEMI  Allergies  Allergen Reactions  . Codeine Nausea And Vomiting  . Lisinopril Cough    Other reaction(s): Cough (ALLERGY/intolerance)  . Nsaids     Other reaction(s): Other (See Comments) CKD Other reaction(s): Other CKD  . Latex Hives   Patient Measurements: Height: 5\' 8"  (172.7 cm) Weight: 186 lb 3.2 oz (84.5 kg) IBW/kg (Calculated) : 68.4 Heparin Dosing Weight:  84.4 kg  Vital Signs: Temp: 97.8 F (36.6 C) (04/25 1139) Temp Source: Oral (04/25 1139) BP: 132/70 (04/25 1522) Pulse Rate: 65 (04/25 1542)  Labs: Recent Labs    10/11/17 1954 10/11/17 2302 10/12/17 0115 10/12/17 0355 10/12/17 0927 10/12/17 1650 10/13/17 0356 10/13/17 0738  HGB 15.1  --   --  13.3  --   --  12.7*  --   HCT 43.6  --   --  39.1  --   --  38.4*  --   PLT 257  --   --  245  --   --  225  --   HEPARINUNFRC  --   --   --   --  0.66 0.44 0.36  --   CREATININE 2.50*  --   --  2.83*  --   --   --  1.86*  TROPONINI  --  5.16* 12.27* 25.47*  --   --   --   --    Estimated Creatinine Clearance: 38 mL/min (A) (by C-G formula based on SCr of 1.86 mg/dL (H)).  Assessment:  Anticoag: hep for afib/flutter, NSTEMI.  no AC PTA.Patient post-cath and to restart heparin drip for afib 4 hours after sheath removal (1930). Cbc wnl.   Heparin level therapeutic: 0.36   Goal of Therapy:  Heparin level 0.3-0.7 units/ml Monitor platelets by anticoagulation protocol: Yes   Plan:  restart heparin drip at 1100 units/hr, no bolus HL in 8 hours Monitor daily heparin level and CBC F/U need for oral anticoagulation if afib continues   Laini Urick A. Levada Dy, PharmD, [place] Pager: [phone] 10/13/2017 3:45 PM

## 2017-10-13 NOTE — Progress Notes (Signed)
Spoke to [place] in the cath lab who states pt is ready for cath lab, advised pt been npo, consents signed, pt has not had chest pain, to turn off heparin, heparin on stopped, pt still has NS at pre cath rate,wife at bedside and has belongings as plan for possible stent per md

## 2017-10-14 ENCOUNTER — Encounter (HOSPITAL_COMMUNITY): Payer: Self-pay | Admitting: Internal Medicine

## 2017-10-14 DIAGNOSIS — R079 Chest pain, unspecified: Secondary | ICD-10-CM

## 2017-10-14 LAB — BASIC METABOLIC PANEL
Anion gap: 9 (ref 5–15)
BUN: 24 mg/dL — ABNORMAL HIGH (ref 6–20)
CO2: 21 mmol/L — ABNORMAL LOW (ref 22–32)
Calcium: 8.7 mg/dL — ABNORMAL LOW (ref 8.9–10.3)
Chloride: 107 mmol/L (ref 101–111)
Creatinine, Ser: 1.81 mg/dL — ABNORMAL HIGH (ref 0.61–1.24)
GFR calc Af Amer: 41 mL/min — ABNORMAL LOW (ref >60)
GFR calc non Af Amer: 36 mL/min — ABNORMAL LOW (ref >60)
Glucose, Bld: 160 mg/dL — ABNORMAL HIGH (ref 65–99)
Potassium: 4.3 mmol/L (ref 3.5–5.1)
Sodium: 137 mmol/L (ref 135–145)

## 2017-10-14 LAB — GLUCOSE, CAPILLARY
Glucose-Capillary: 151 mg/dL — ABNORMAL HIGH (ref 65–99)
Glucose-Capillary: 159 mg/dL — ABNORMAL HIGH (ref 65–99)

## 2017-10-14 LAB — CBC
HCT: 38.8 % — ABNORMAL LOW (ref 39.0–52.0)
Hemoglobin: 13 g/dL (ref 13.0–17.0)
MCH: 30.3 pg (ref 26.0–34.0)
MCHC: 33.5 g/dL (ref 30.0–36.0)
MCV: 90.4 fL (ref 78.0–100.0)
Platelets: 233 10*3/uL (ref 150–400)
RBC: 4.29 MIL/uL (ref 4.22–5.81)
RDW: 13.7 % (ref 11.5–15.5)
WBC: 6.5 10*3/uL (ref 4.0–10.5)

## 2017-10-14 LAB — HEPARIN LEVEL (UNFRACTIONATED)
Heparin Unfractionated: 0.19 IU/mL — ABNORMAL LOW (ref 0.30–0.70)
Heparin Unfractionated: 0.26 IU/mL — ABNORMAL LOW (ref 0.30–0.70)

## 2017-10-14 MED ORDER — RIVAROXABAN 15 MG PO TABS: 15.0000 mg | ORAL_TABLET | Freq: Every day | ORAL | 2 refills | Status: DC

## 2017-10-14 MED ORDER — METOPROLOL TARTRATE 25 MG PO TABS: 12.5000 mg | ORAL_TABLET | Freq: Two times a day (BID) | ORAL | 3 refills | Status: DC

## 2017-10-14 MED ORDER — EZETIMIBE 10 MG PO TABS: 10.0000 mg | ORAL_TABLET | Freq: Every day | ORAL | 3 refills | Status: DC

## 2017-10-14 MED ORDER — RIVAROXABAN 15 MG PO TABS
15.0000 mg | ORAL_TABLET | Freq: Every day | ORAL | Status: DC
  Administered 2017-10-14: 15 mg via ORAL
  Filled 2017-10-14: qty 1

## 2017-10-14 MED ORDER — RIVAROXABAN (XARELTO) VTE STARTER PACK (15 & 20 MG): ORAL_TABLET | ORAL | 0 refills | Status: DC

## 2017-10-14 MED ORDER — NITROGLYCERIN 0.4 MG SL SUBL: 0.4000 mg | SUBLINGUAL_TABLET | SUBLINGUAL | 12 refills | Status: DC | PRN

## 2017-10-14 MED FILL — Heparin Sod (Porcine)-NaCl IV Soln 1000 Unit/500ML-0.9%: INTRAVENOUS | Qty: 1000 | Status: AC

## 2017-10-14 NOTE — Discharge Summary (Signed)
Physician Discharge Summary  Marvin Salinas GQQ:[id number] DOB: [birth year] DOA: 10/11/2017  PCP: Aletha Halim., PA-C  Admit date: 10/11/2017 Discharge date: 10/14/2017  Admitted From: Home Discharge disposition: Home   Recommendations for Outpatient Follow-Up:   1. Outpatient F/U with cardiology. 2. Metformin discontinued. Follow-up with PCP for further management of diabetes.   Discharge Diagnosis:   Principal Problem:   NSTEMI (non-ST elevated myocardial infarction) ([place]) Active Problems:   S/P CABG x 3   Tobacco use disorder   Diabetes mellitus, type 2 ([place])   Essential (primary) hypertension   Chest pain   Acute kidney injury superimposed on chronic kidney disease ([place])   Nonsustained ventricular tachycardia ([facility])   Paroxysmal atrial flutter ([place])   Discharge Condition: Improved.  Diet recommendation: Low sodium, heart healthy.  Carbohydrate-modified.  Regular.  Code status: Full.   History of Present Illness:   Marvin Salinas is an 72 y.o. male the PMH of hypertension, hyperlipidemia, CAD status post CABG/PCI and type 2 diabetes who was admitted 10/11/17 for evaluation of chest pain. Troponin elevated to 25.  Hospital Course by Problem:   Principal Problem:   Chest pain secondary to NSTEMI (non-ST elevated myocardial infarction) (Marvin Salinas) in a patient with known CAD status post CABG 3 Chest x-ray negative for acute findings. Cardiomegaly noted. Troponins markedly elevated to 25. Managed with heparin drip and when necessary nitroglycerin for active chest pain. 2-D echo shows EF 55-60 percent with no regional wall motion abnormalities. Cardiac cath done 10/13/17 and showed severe three-vessel CAD with chronic total occlusions of the proximal/mid LAD, mid-distal LCx and proximal RCA. Medical therapy optimization advised. Continue Plavix and Xarelto added per cardiology recommendations.  Active Problems:   Paroxysmal atrial flutter Heart rate initially in the 160s.  Spontaneously converted. Chads 2 Vasc score is 6.  Continue metoprolol. Xarelto 15 mg daily prescribed at discharge.    Tobacco use disorder Has been counseled.    Dyslipidemia LDL goal less than 70, currently 111. Continue pravastatin/fish oil. Has been intolerant of other statins. Zetia added.    Acute kidney injury superimposed on chronic kidney disease (H[facility]) Initial creatinine was 2.5, baseline around 1. 7-2. Gently hydrated. Creatinine now back to baseline values of 1.86. Renal ultrasound negative for hydronephrosis.    Nonsustained ventricular tachycardia (Marvin Salinas) New problem identified. Continue beta blocker. Potassium and magnesium were within normal limits.    Essential hypertension Continue metoprolol. Okay to resume Benicar/HCTZ at discharge.    Type 2 diabetes with long-term insulin use Metformin discontinued. Currently being managed with Lantus 15 units twice a day and insulin sensitive SSI. CBGs 118-201. Recommend close follow-up of glycemic control.   Medical Consultants:    None.   Discharge Exam:   Vitals:   10/14/17 0833 10/14/17 1135  BP: 134/72 (!) 146/73  Pulse: (!) 51 (!) 53  Resp: 17 18  Temp: 98.3 F (36.8 C) 98.3 F (36.8 C)  SpO2: 97% 100%   Vitals:   10/14/17 0030 10/14/17 0358 10/14/17 0833 10/14/17 1135  BP: (!) 136/56 132/64 134/72 (!) 146/73  Pulse: (!) 58 (!) 51 (!) 51 (!) 53  Resp: '18 18 17 18  ' Temp: 97.9 F (36.6 C) 98.1 F (36.7 C) 98.3 F (36.8 C) 98.3 F (36.8 C)  TempSrc: Oral Oral Oral Oral  SpO2: 99% 97% 97% 100%  Weight:  83.5 kg (184 lb)    Height:        General exam: Appears calm and comfortable.  Respiratory system: Clear  to auscultation. Respiratory effort normal. Cardiovascular system: S1 & S2 heard, RRR. No JVD,  rubs, gallops or clicks. No murmurs. Gastrointestinal system: Abdomen is nondistended, soft and nontender. No organomegaly or masses felt. Normal bowel sounds heard. Central nervous system: Alert  and oriented. No focal neurological deficits. Extremities: No clubbing,  or cyanosis. No edema. Skin: No rashes, lesions or ulcers. Psychiatry: Judgement and insight appear normal. Mood & affect appropriate.    The results of significant diagnostics from this hospitalization (including imaging, microbiology, ancillary and laboratory) are listed below for reference.     Procedures and Diagnostic Studies:   Dg Chest 2 View  Result Date: 10/11/2017 CLINICAL DATA:  Central chest pressure and dyspnea since 6 p.m. EXAM: CHEST - 2 VIEW COMPARISON:  03/05/2011 FINDINGS: Stable cardiomegaly with aortic atherosclerosis. No aneurysm. Status post CABG. Chronic interstitial prominence without alveolar consolidation, effusion or pneumothorax. No acute osseous abnormality. IMPRESSION: 1. Chronic interstitial prominence without acute pulmonary disease. 2. Cardiomegaly with aortic atherosclerosis. Stable post CABG change. Electronically Signed   By: Ashley Royalty M.D.   On: 10/11/2017 21:34   US Renal  Result Date: 10/12/2017 CLINICAL DATA:  Acute kidney injury. Chronic kidney disease stage 3. EXAM: RENAL / URINARY TRACT ULTRASOUND COMPLETE COMPARISON:  CT AP 06/16/2014. FINDINGS: Right Kidney: Length: 11.6 cm. Mild perinephric fluid around the inferior pole noted. Echogenicity within normal limits. No mass or hydronephrosis visualized. Left Kidney: Length: 12.2 cm. Echogenicity within normal limits. No mass or hydronephrosis visualized. Bladder: Appears normal for degree of bladder distention. Other:  Small volume of perihepatic ascites. IMPRESSION: 1. Normal cortical echogenicity of both kidneys without hydronephrosis. 2. Mild perinephric fluid surrounds the lower pole of the right kidney, which may be related to recent acute kidney injury. Electronically Signed   By: Kerby Moors M.D.   On: 10/12/2017 12:20     Labs:   Basic Metabolic Panel: Recent Labs  Lab 10/11/17 1954 10/12/17 0355 10/13/17 0738  10/14/17 0307  NA 143 141 138 137  K 4.6 4.1 3.9 4.3  CL 107 109 108 107  CO2 23 21* 22 21*  GLUCOSE 150* 110* 116* 160*  BUN 24* 27* 24* 24*  CREATININE 2.50* 2.83* 1.86* 1.81*  CALCIUM 10.3 9.3 8.9 8.7*  MG 2.2  --   --   --    GFR Estimated Creatinine Clearance: 38.8 mL/min (A) (by C-G formula based on SCr of 1.81 mg/dL (H)). Liver Function Tests: Recent Labs  Lab 10/11/17 1954  AST 46*  ALT 35  ALKPHOS 49  BILITOT 0.6  PROT 7.1  ALBUMIN 4.5    CBC: Recent Labs  Lab 10/11/17 1954 10/12/17 0355 10/13/17 0356 10/14/17 0307  WBC 13.4* 11.2* 6.1 6.5  NEUTROABS  --  7.5  --   --   HGB 15.1 13.3 12.7* 13.0  HCT 43.6 39.1 38.4* 38.8*  MCV 89.7 89.5 90.8 90.4  PLT 257 245 225 233   Cardiac Enzymes: Recent Labs  Lab 10/11/17 2302 10/12/17 0115 10/12/17 0355  TROPONINI 5.16* 12.27* 25.47*   CBG: Recent Labs  Lab 10/13/17 1137 10/13/17 1613 10/13/17 2100 10/14/17 0738 10/14/17 1135  GLUCAP 108* 85 196* 151* 159*   Lipid Profile Recent Labs    10/12/17 0411  CHOL 171  HDL 42  LDLCALC 111*  TRIG 92  CHOLHDL 4.1   Thyroid function studies Recent Labs    10/11/17 2054  TSH 1.498    Discharge Instructions:   Discharge Instructions    Call  MD for:  difficulty breathing, headache or visual disturbances   Complete by:  As directed    Call MD for:  extreme fatigue   Complete by:  As directed    Call MD for:  persistant nausea and vomiting   Complete by:  As directed    Call MD for:  severe uncontrolled pain   Complete by:  As directed    Diet - low sodium heart healthy   Complete by:  As directed    Diet Carb Modified   Complete by:  As directed    Increase activity slowly   Complete by:  As directed      Allergies as of 10/14/2017      Reactions   Codeine Nausea And Vomiting   Lisinopril Cough   Other reaction(s): Cough (ALLERGY/intolerance)   Nsaids    Other reaction(s): Other (See Comments) CKD Other reaction(s): Other CKD    Latex Hives      Medication List    STOP taking these medications   aspirin 81 MG tablet   metFORMIN 1000 MG tablet Commonly known as:  GLUCOPHAGE     TAKE these medications   clopidogrel 75 MG tablet Commonly known as:  PLAVIX Take 1 tablet (75 mg total) by mouth daily.   DRY EYES OP Place 1 drop into both eyes daily. Gen teal   ezetimibe 10 MG tablet Commonly known as:  ZETIA Take 1 tablet (10 mg total) by mouth daily.   Fish Oil 1000 MG Caps Take 1,000 mg by mouth 2 (two) times daily.   fluticasone 50 MCG/ACT nasal spray Commonly known as:  FLONASE Place 1 spray into both nostrils as needed.   insulin glargine 100 UNIT/ML injection Commonly known as:  LANTUS Inject 30 Units into the skin 2 (two) times daily.   loratadine 10 MG tablet Commonly known as:  CLARITIN Take 10 mg by mouth daily as needed.   metoprolol tartrate 25 MG tablet Commonly known as:  LOPRESSOR Take 0.5 tablets (12.5 mg total) by mouth 2 (two) times daily. What changed:  how much to take   nitroGLYCERIN 0.4 MG SL tablet Commonly known as:  NITROSTAT Place 1 tablet (0.4 mg total) under the tongue every 5 (five) minutes as needed for chest pain.   olmesartan-hydrochlorothiazide 20-12.5 MG tablet Commonly known as:  BENICAR HCT TAKE 1 TABLET BY MOUTH EVERY DAY   ondansetron 4 MG disintegrating tablet Commonly known as:  ZOFRAN ODT Take one tab by mouth Q6hr prn nausea.  Dissolve under tongue.   pravastatin 80 MG tablet Commonly known as:  PRAVACHOL Take 80 mg by mouth at bedtime.   Rivaroxaban 15 MG Tabs tablet Commonly known as:  XARELTO Take 1 tablet (15 mg total) by mouth daily with supper.   SINUS RINSE BOTTLE KIT NA Place into the nose as needed.   TRICOR 145 MG tablet Generic drug:  fenofibrate TAKE 1 TABLET BY MOUTH EVERY DAY      Follow-up Information    Aletha Halim., PA-C. Go on 10/20/2017.   Specialty:  Family Medicine Why:  11:30am Contact information: [address] [place] [address] [phone]        Daune Perch, NP. Go on 10/26/2017.   Specialty:  Nurse Practitioner Why:  '@10' :15 am Contact information: [place] [address] [phone]            Time coordinating discharge: 35 minutes.  Signed:  Margreta Journey Muscab Brenneman  Pager [phone] [facility] 10/14/2017, 11:45 AM

## 2017-10-14 NOTE — Progress Notes (Addendum)
Progress Note  Patient Name: Marvin Salinas Date of Encounter: 10/14/2017  Primary Cardiologist: Marvin Moores, MD  Subjective   Feeling well this morning. No chest pain. Anxious to go home  Inpatient Medications    Scheduled Meds: . aspirin  81 mg Oral Daily  . clopidogrel  75 mg Oral Daily  . ezetimibe  10 mg Oral Daily  . fenofibrate  160 mg Oral Daily  . insulin aspart  0-5 Units Subcutaneous QHS  . insulin aspart  0-9 Units Subcutaneous TID WC  . insulin glargine  15 Units Subcutaneous BID  . metoprolol tartrate  12.5 mg Oral BID  . pravastatin  80 mg Oral QHS  . sodium chloride flush  3 mL Intravenous Q12H   Continuous Infusions: . sodium chloride    . heparin 1,100 Units/hr (10/13/17 2122)   PRN Meds: sodium chloride, acetaminophen, fluticasone, gi cocktail, guaiFENesin-dextromethorphan, ipratropium-albuterol, lip balm, morphine injection, nitroGLYCERIN, ondansetron (ZOFRAN) IV, phenol, polyvinyl alcohol, sodium chloride, sodium chloride flush   Vital Signs    Vitals:   10/13/17 2121 10/14/17 0030 10/14/17 0358 10/14/17 0833  BP: 135/61 (!) 136/56 132/64 134/72  Pulse: (!) 57 (!) 58 (!) 51 (!) 51  Resp: 16 18 18 17   Temp: 97.7 F (36.5 C) 97.9 F (36.6 C) 98.1 F (36.7 C) 98.3 F (36.8 C)  TempSrc: Oral Oral Oral Oral  SpO2: 97% 99% 97% 97%  Weight:   184 lb (83.5 kg)   Height:        Intake/Output Summary (Last 24 hours) at 10/14/2017 1610 Last data filed at 10/14/2017 0820 Gross per 24 hour  Intake 772.45 ml  Output 1400 ml  Net -627.55 ml   Filed Weights   10/12/17 1300 10/13/17 0400 10/14/17 0358  Weight: 186 lb (84.4 kg) 186 lb 3.2 oz (84.5 kg) 184 lb (83.5 kg)    Telemetry    Sinus brady- Personally Reviewed  ECG    N/a - Personally Reviewed  Physical Exam   General: Well developed, well nourished, male appearing in no acute distress. Head: Normocephalic, atraumatic.  Neck: Supple without bruits, JVD. Lungs:  Resp regular and  unlabored, CTA. Heart: RRR, S1, S2, no S3, S4, or murmur; no rub. Abdomen: Soft, non-tender, non-distended with normoactive bowel sounds. No hepatomegaly. No rebound/guarding. No obvious abdominal masses. Extremities: No clubbing, cyanosis, edema. Distal pedal pulses are 2+ bilaterally. Left radial site without hematoma. Neuro: Alert and oriented X 3. Moves all extremities spontaneously. Psych: Normal affect.  Labs    Chemistry Recent Labs  Lab 10/11/17 1954 10/12/17 0355 10/13/17 0738 10/14/17 0307  NA 143 141 138 137  K 4.6 4.1 3.9 4.3  CL 107 109 108 107  CO2 23 21* 22 21*  GLUCOSE 150* 110* 116* 160*  BUN 24* 27* 24* 24*  CREATININE 2.50* 2.83* 1.86* 1.81*  CALCIUM 10.3 9.3 8.9 8.7*  PROT 7.1  --   --   --   ALBUMIN 4.5  --   --   --   AST 46*  --   --   --   ALT 35  --   --   --   ALKPHOS 49  --   --   --   BILITOT 0.6  --   --   --   GFRNONAA 24* 21* 35* 36*  GFRAA 28* 24* 40* 41*  ANIONGAP 13 11 8 9      Hematology Recent Labs  Lab 10/12/17 0355 10/13/17 0356 10/14/17 9604  WBC 11.2* 6.1 6.5  RBC 4.37 4.23 4.29  HGB 13.3 12.7* 13.0  HCT 39.1 38.4* 38.8*  MCV 89.5 90.8 90.4  MCH 30.4 30.0 30.3  MCHC 34.0 33.1 33.5  RDW 13.7 13.9 13.7  PLT 245 225 233    Cardiac Enzymes Recent Labs  Lab 10/11/17 2302 10/12/17 0115 10/12/17 0355  TROPONINI 5.16* 12.27* 25.47*    Recent Labs  Lab 10/11/17 2006  TROPIPOC 0.00     BNP Recent Labs  Lab 10/11/17 1954  BNP 140.6*     DDimer No results for input(s): DDIMER in the last 168 hours.    Radiology    US Renal  Result Date: 10/12/2017 CLINICAL DATA:  Acute kidney injury. Chronic kidney disease stage 3. EXAM: RENAL / URINARY TRACT ULTRASOUND COMPLETE COMPARISON:  CT AP 06/16/2014. FINDINGS: Right Kidney: Length: 11.6 cm. Mild perinephric fluid around the inferior pole noted. Echogenicity within normal limits. No mass or hydronephrosis visualized. Left Kidney: Length: 12.2 cm. Echogenicity within  normal limits. No mass or hydronephrosis visualized. Bladder: Appears normal for degree of bladder distention. Other:  Small volume of perihepatic ascites. IMPRESSION: 1. Normal cortical echogenicity of both kidneys without hydronephrosis. 2. Mild perinephric fluid surrounds the lower pole of the right kidney, which may be related to recent acute kidney injury. Electronically Signed   By: Kerby Moors M.D.   On: 10/12/2017 12:20    Cardiac Studies   TTE: 10/12/17  Study Conclusions  - Left ventricle: The cavity size was normal. Wall thickness was   increased in a pattern of mild LVH. Systolic function was normal.   The estimated ejection fraction was in the range of 55% to 60%.   Wall motion was normal; there were no regional wall motion   abnormalities. Left ventricular diastolic function parameters   were normal.  Impressions:  - Normal LV systolic function; mild diastolic dysfunction; mild   LVH.  Procedures   LEFT HEART CATH AND CORS/GRAFTS ANGIOGRAPHY  Conclusion   Conclusions: 1. Severe 3-vessel coronary artery disease with chronic total occlusions of the proximal/mid LAD, mid/distal LCx, and proximal RCA. 2. Moderate caliber D1 and distal rPDA demonstrate 70-80% stenoses and are not revascularized. 3. Widely patent LIMA-LAD, SVG-OM2, and SVG-rPDA. 4. Moderately elevated left ventricular filling pressure (LVEDP 28 mmHg).  Recommendations: 1. Optimize medical therapy.  OM1 and mid/distal LCx occlusions are new since 2011 but involve relatively small territories.  Additionally, faint collaterals are present.  I suspect chest pain was due to demand ischemia in the setting of atrial flutter, though troponin elevation seems a bit excessive for supply-demand mismatch. 2. If [name] has recurrent chest pain despite aggressive medical therapy, PCI to D1 could be considered in the future.  Distal rPDA stenosis is too small/distal for percutaneous intervention. 3. No further IV  fluids at this time.  Consider gentle diuresis beginning tomorrow as renal function allows, given moderately elevated LVEDP. 4. Restart heparin in 4 hours.  Given recent atrial flutter, [name] would benefit from anticoagulation in addition to antiplatelet therapy (favor NOAC + clopidogrel or ASA).  Nelva Bush, MD [facility] Pager: [phone]     Patient Profile     72 y.o. male with PMH of CAD s/p PCI-RCA ('89), PCI-LAD/OM ('92) 3v CABG ('11), HL, HTN, DM, TIA and left cerebellar stroke with carotid artery disease who presented with new on set Aflutter RVR and chest pain.   Assessment & Plan    1. New onset Atrial Flutter: sudden  onset of palpitations around 6pm the night of admission. EKG confirmed Atrial Flutter with rate 140. Spontaneously converted to SR, no acute ischemia noted. No hx of the same. On BB therapy as outpatient. This patients CHA2DS2-VASc Score of 6. Will need long term DOAC. Given renal insufficiency will start Xarelto 15 mg daily. Stop ASA. Continue plavix for CAD. If he has recurrent atrial flutter may be a candidate for ablation.  2. NSTEMI: Initial Trop was neg, but increased from 5.16>>12.27>>25.47. Hx of CAD with CABG in 2011. Cardiac cath shows patent grafts. Modest disease in first diagonal does not look acute. Will manage medically.  -- echo with normal EF and mild diastolic dysfunction  3. CAD s/p 3v CABG ('11): Prior to last evening, he has not experienced any anginal symptoms.  -- continue  statin, BB, fenofribate and plavix  4. DM: states he has been on metformin and insulin at home. Metformin held this admission. Will need to stop this at discharge given his renal function. Will need to follow up with PCP. -- check Hgb A1c  5. HL: has been intolerant to statins in the past. Tolerating pravastatin.  --  LDL still above goal at 111. He is willing to try Zetia. If not able to tolerate consider PCSK 9 inhibitor.   6. HTN:  controlled.   Signed, Emery Dupuy [place], MD  10/14/2017, 9:24 AM  Pager # [phone]   For questions or updates, please contact [place] Please consult [email or web address] for contact info under Cardiology/STEMI.

## 2017-10-14 NOTE — Progress Notes (Addendum)
Patient to be discharged home on Xarelto; benefit check in progressAneta Mins [phone]  12:02 pm  RE: Benefit check  Received: Today  Message Contents  Memory Argue CMA  Xarelto coupon not given to patient, pt was discharged prior to being seen by CM. Mindi Slicker RN,MHA,BSN      # 3. S/W [facility] @ [employer] RX # [id number]    1. XARELTO 20 MG DAILY  COVER- YES  CO-PAY- $ 45.00 Q/L ONE PER DAY  TIER- 3 DRUG  PRIOR APPROVAL- NO   2. XARELTO 15 MG BID  COVER- YES  CO-PAY- $ 45.00 Q/L TWO PILL PER DAY  TIER- 3 DRUG  PRIOR APPROVAL- NO   PREFERRED PHARMACY : YES- [facility]

## 2017-10-14 NOTE — Discharge Instructions (Signed)
Atrial Flutter °Atrial flutter is a type of abnormal heart rhythm (arrhythmia). In atrial flutter, the heartbeat is fast but regular. There are two types of atrial flutter: °· Paroxysmal atrial flutter. This type starts suddenly. It usually stops on its own soon after it starts. °· Permanent atrial flutter. This type does not go away. ° °What are the causes? °This condition may be caused by: °· A heart condition or problem, such as: °? A heart attack. °? Heart failure. °? A heart valve problem. °· A lung problem, such as: °? A blood clot in the lungs (pulmonary embolism, or PE). °? Chronic obstructive pulmonary disease. °· Poorly controlled high blood pressure (hypertension). °· Hyperthyroidism. °· Caffeine. °· Some decongestant cold medicines. °· Low levels of minerals called electrolytes in the blood. °· Cocaine. ° °What increases the risk? °This condition is more likely to develop in: °· Elderly adults. °· Men. ° °What are the signs or symptoms? °Symptoms of this condition include: °· A feeling that your heart is pounding or racing (palpitations). °· Shortness of breath. °· Chest pain. °· Feeling light-headed. °· Dizziness. °· Fainting. ° °How is this diagnosed? °This condition may be diagnosed with tests, including: °· An electrocardiogram (ECG). This is a painless test that records electrical signals in the heart. °· Holter monitoring. For this test, you wear a device that records your heartbeat for 1-2 days. °· Cardiac event monitoring. For this test, you wear a device that records your heartbeat for up to 30 days. °· An echocardiogram. This is a painless test that uses sound waves to make a picture of your heart. °· Stress test. This test records your heartbeat while you exercise. °· Blood tests. ° °How is this treated? °This condition may be treated with: °· Treatment of any underlying conditions. °· Medicine to make your heart beat more slowly. °· Medicine to keep the condition from coming back. °· A  procedure to keep the condition under control. Some procedures to do this include: °? Cardioversion. During this procedure, medicines or an electrical shock are given to make the heart beat normally. °? Ablation. During this procedure, the heart tissue that is causing the problem is destroyed. This procedure may be done if atrial flutter lasts a long time or happens often. ° °Follow these instructions at home: °· Take over-the-counter and prescription medicines only as told by your health care provider. °· Do not take any new medicines without talking to your health care provider. °· Do not use tobacco products, including cigarettes, chewing tobacco, or e-cigarettes. If you need help quitting, ask your health care provider. °· Limit alcohol intake to no more than 1 drink per day for nonpregnant women and 2 drinks per day for men. One drink equals 12 oz of beer, 5 oz of wine, or 1½ oz of hard liquor. °· Try to reduce any stress. Stress can make your symptoms worse. °Contact a health care provider if: °· Your symptoms get worse. °Get help right away if: °· You are dizzy. °· You feel like fainting or you faint. °· You have shortness of breath. °· You feel pain or pressure in your chest. °· You suddenly feel nauseous or you suddenly vomit. °· There is a sudden change in your ability to speak, eat, or move. °· You are sweating a lot for no reason. °This information is not intended to replace advice given to you by your health care provider. Make sure you discuss any questions you have with your health care   provider. °Document Released: 10/24/2008 Document Revised: 10/15/2015 Document Reviewed: 12/20/2014 °Elsevier Interactive Patient Education © 2018 [employer] ° °

## 2017-10-14 NOTE — Progress Notes (Signed)
ANTICOAGULATION CONSULT NOTE - Follow Up Consult  Pharmacy Consult for Heparin Indication: atrial fibrillation, NSTEMI  Allergies  Allergen Reactions  . Codeine Nausea And Vomiting  . Lisinopril Cough    Other reaction(s): Cough (ALLERGY/intolerance)  . Nsaids     Other reaction(s): Other (See Comments) CKD Other reaction(s): Other CKD  . Latex Hives   Patient Measurements: Height: 5\' 8"  (172.7 cm) Weight: 186 lb 3.2 oz (84.5 kg) IBW/kg (Calculated) : 68.4 Heparin Dosing Weight:  84.4 kg  Vital Signs: Temp: 97.9 F (36.6 C) (04/26 0030) Temp Source: Oral (04/26 0030) BP: 136/56 (04/26 0030) Pulse Rate: 58 (04/26 0030)  Labs: Recent Labs    10/11/17 2302 10/12/17 0115 10/12/17 0355  10/12/17 1650 10/13/17 0356 10/13/17 0738 10/14/17 0307  HGB  --   --  13.3  --   --  12.7*  --  13.0  HCT  --   --  39.1  --   --  38.4*  --  38.8*  PLT  --   --  245  --   --  225  --  233  HEPARINUNFRC  --   --   --    < > 0.44 0.36  --  0.19*  CREATININE  --   --  2.83*  --   --   --  1.86* 1.81*  TROPONINI 5.16* 12.27* 25.47*  --   --   --   --   --    < > = values in this interval not displayed.   Estimated Creatinine Clearance: 39 mL/min (A) (by C-G formula based on SCr of 1.81 mg/dL (H)).  Assessment:  Anticoag: hep for afib/flutter, NSTEMI.  no AC PTA.Patient post-cath and to restart heparin drip for afib 4 hours after sheath removal (1930). Cbc wnl.   4/26 AM update: heparin re-start time delayed several hours due to some bleeding from cath site, OK now, Hgb good  Goal of Therapy:  Heparin level 0.3-0.7 units/ml Monitor platelets by anticoagulation protocol: Yes   Plan:  Cont heparin at current rate due to delay in start time  Re-check heparin level at [place], PharmD, [place] Pharmacist Phone: [phone]

## 2017-10-25 ENCOUNTER — Encounter: Payer: Self-pay | Admitting: Cardiology

## 2017-10-26 ENCOUNTER — Ambulatory Visit: Payer: Medicare Other | Admitting: Cardiology

## 2017-10-26 ENCOUNTER — Encounter: Payer: Self-pay | Admitting: Cardiology

## 2017-10-26 VITALS — BP 134/72 | HR 62 | Ht 68.0 in | Wt 186.2 lb

## 2017-10-26 DIAGNOSIS — N183 Chronic kidney disease, stage 3 unspecified: Secondary | ICD-10-CM

## 2017-10-26 DIAGNOSIS — Z794 Long term (current) use of insulin: Secondary | ICD-10-CM | POA: Diagnosis not present

## 2017-10-26 DIAGNOSIS — I2510 Atherosclerotic heart disease of native coronary artery without angina pectoris: Secondary | ICD-10-CM | POA: Diagnosis not present

## 2017-10-26 DIAGNOSIS — E118 Type 2 diabetes mellitus with unspecified complications: Secondary | ICD-10-CM

## 2017-10-26 DIAGNOSIS — I483 Typical atrial flutter: Secondary | ICD-10-CM | POA: Diagnosis not present

## 2017-10-26 DIAGNOSIS — I10 Essential (primary) hypertension: Secondary | ICD-10-CM | POA: Diagnosis not present

## 2017-10-26 DIAGNOSIS — E785 Hyperlipidemia, unspecified: Secondary | ICD-10-CM

## 2017-10-26 NOTE — Patient Instructions (Signed)
Medication Instructions:  Your physician recommends that you continue on your current medications as directed. Please refer to the Current Medication list given to you today.   Labwork: None ordered  Testing/Procedures: None ordered  Follow-Up: Your physician recommends that you have a follow-up appointment on 01/25/18 at 9:30 AM with Truitt Merle, NP   Any Other Special Instructions Will Be Listed Below (If Applicable).     If you need a refill on your cardiac medications before your next appointment, please call your pharmacy.

## 2017-10-26 NOTE — Progress Notes (Addendum)
Cardiology Office Note:    Date:  10/26/2017   ID:  Marvin Salinas, DOB [birth year], MRN [id number]  PCP:  Aletha Halim., PA-C  Cardiologist:  Mertie Moores, MD CAD Referring MD: Aletha Halim., PA-C   Chief Complaint  Patient presents with  . Follow-up    pt went to hospital 10/11/17 heart attack, pt denies recent chest pains, SOB, pt does mention swelling in hands/feet    History of Present Illness:    Marvin Salinas is a 72 y.o. male with a past medical history significant for CAD as/P CABG 2011, hypertension, hyperlipidemia, diabetes type 2, CKD, extensive cerebrovascular disease with prior TIAs and known basilar artery stenosis.  He is on chronic Plavix and aspirin therapy.  He has been on Coumadin in the remote past.  He is being followed for moderate atypical leg claudication by Dr. Fletcher Anon.  Vascular studies in 2018 showed mildly reduced ABI on the left side at 0.83.  Duplex showed significant bilateral iliac disease and moderate SFA/popliteal disease with possible significant stenosis affecting the distal left SFA.  He has been counseled on attempting a walking program and smoking cessation.  Marvin Salinas was admitted for NSTEMI 10/11/2017-10/14/2017.  Troponin elevated to 25.  He was in atrial flutter with rapid ventricular response. An echocardiogram showed EF 55-60% with no regional wall motion abnormalities.  Cardiac cath done on 10/13/2017 showed severe three-vessel CAD with chronic total occlusions of the proximal/mid LAD, mid-distal LCx and proximal RCA.  He had widely patent grafts.  There was a moderate caliber D1 and distal rPDA stenoses that were not revascularized.  This is felt to supply small territories and faint collaterals are present.  Dr. Saunders Revel suspected that the chest pain was due to demand ischemia in the setting of atrial flutter though the troponin elevation was high for supply-demand mismatch.  It was noted that if Marvin Salinas has recurrent chest pain despite  aggressive medical therapy, PCI to D1 could be considered in the future.  The distal rPDA stenosis is too small for percutaneous intervention.  Medical therapy optimization was advised.  He is continued on Plavix and Xarelto added.  His serum creatinine was initially 2.5, baseline around 1.7-2.  He was gently hydrated and creatinine came back down to baseline values of 1.86.  He is here today alone for follow up. He has had no recurrences of the symptoms that he had with the atrial flutter. He has not gotten back into his usual activities yet but has had no exertional shortness of breath or chest discomfort with walking or climbing stairs. He denies orthopnea, PND, palpitations or lightheadedness. He has had no unusual bleeding or bruising. He has Mild ankle edema at the end of the day that resolves overnight.   Past Medical History:  Diagnosis Date  . Back pain   . Basilar artery stenosis    on chronic Plavix  . Cerebral vascular disease    with prior TIA's; followed by Dr. Erling Cruz  . Depression   . Diabetes mellitus    on insulin  . History of kidney stones   . History of renal calculi   . Hyperlipidemia   . Hypertension   . Ischemic heart disease    prior PCI to RCA in 1989. S/P PCI to LAD and OM in 1992. S/P PCI to first DX in 2000. S/P CABG x 3 in May 2011  . Peripheral neuropathy   . Pneumonia Sept 2012  . S/P CABG x 21 Oct 2009  . Sleep apnea     Past Surgical History:  Procedure Laterality Date  . ANGIOPLASTY  1989   right coronary artery  . ANGIOPLASTY  1992   LAD and OM  . ANGIOPLASTY  1998   First DX  . CORONARY ARTERY BYPASS GRAFT  11/12/2009   LIMA to LAD, SVG to OM and SVG to RCA  . CORONARY STENT PLACEMENT  2000   Stent to LAD/Circumflex with angioplasty to first diagonal   . LEFT HEART CATH AND CORS/GRAFTS ANGIOGRAPHY N/A 10/13/2017   Procedure: LEFT HEART CATH AND CORS/GRAFTS ANGIOGRAPHY;  Surgeon: Nelva Bush, MD;  Location: [facility];  Service:  Cardiovascular;  Laterality: N/A;    Current Medications: Current Meds  Medication Sig  . Artificial Tear Ointment (DRY EYES OP) Place 1 drop into both eyes daily. Gen teal  . clopidogrel (PLAVIX) 75 MG tablet Take 1 tablet (75 mg total) by mouth daily.  Marland Kitchen ezetimibe (ZETIA) 10 MG tablet Take 1 tablet (10 mg total) by mouth daily.  . fluticasone (FLONASE) 50 MCG/ACT nasal spray Place 1 spray into both nostrils as needed.  . Hypertonic Nasal Wash (SINUS RINSE BOTTLE KIT NA) Place into the nose as needed.   . insulin glargine (LANTUS) 100 UNIT/ML injection Inject 35 Units into the skin 2 (two) times daily.   Marland Kitchen loratadine (CLARITIN) 10 MG tablet Take 10 mg by mouth daily as needed.   . metoprolol tartrate (LOPRESSOR) 25 MG tablet Take 0.5 tablets (12.5 mg total) by mouth 2 (two) times daily.  . nitroGLYCERIN (NITROSTAT) 0.4 MG SL tablet Place 1 tablet (0.4 mg total) under the tongue every 5 (five) minutes as needed for chest pain.  Marland Kitchen olmesartan-hydrochlorothiazide (BENICAR HCT) 20-12.5 MG tablet TAKE 1 TABLET BY MOUTH EVERY DAY  . Omega-3 Fatty Acids (FISH OIL) 1000 MG CAPS Take 1,000 mg by mouth 2 (two) times daily.  . ondansetron (ZOFRAN ODT) 4 MG disintegrating tablet Take one tab by mouth Q6hr prn nausea.  Dissolve under tongue.  . pravastatin (PRAVACHOL) 80 MG tablet Take 80 mg by mouth at bedtime.   . Rivaroxaban (XARELTO) 15 MG TABS tablet Take 1 tablet (15 mg total) by mouth daily with supper.  . TRICOR 145 MG tablet TAKE 1 TABLET BY MOUTH EVERY DAY     Allergies:   Codeine; Lisinopril; Nsaids; and Latex   Social History   Socioeconomic History  . Marital status: Divorced    Spouse name: Not on file  . Number of children: 0  . Years of education: GED  . Highest education level: Not on file  Occupational History  . Occupation: retired  Scientific laboratory technician  . Financial resource strain: Not on file  . Food insecurity:    Worry: Not on file    Inability: Not on file  . Transportation  needs:    Medical: Not on file    Non-medical: Not on file  Tobacco Use  . Smoking status: Former Smoker    Packs/day: 1.00    Years: 54.00    Pack years: 54.00    Types: Cigarettes    Last attempt to quit: 10/19/2009    Years since quitting: 8.0  . Smokeless tobacco: Never Used  Substance and Sexual Activity  . Alcohol use: No  . Drug use: No  . Sexual activity: Never  Lifestyle  . Physical activity:    Days per week: Not on file    Minutes per session: Not on file  .  Stress: Not on file  Relationships  . Social connections:    Talks on phone: Not on file    Gets together: Not on file    Attends religious service: Not on file    Active member of club or organization: Not on file    Attends meetings of clubs or organizations: Not on file    Relationship status: Not on file  Other Topics Concern  . Not on file  Social History Narrative   Patient is Divorced.   Patient has his GED.   Patient is right handed.   Patient does not drink any caffeine.     Family History: The patient's family history includes Asthma in his brother; Heart attack in his father; Heart disease in his father; Hypertension in his father; Stroke in his unknown relative. ROS:   Please see the history of present illness.     All other systems reviewed and are negative.  EKGs/Labs/Other Studies Reviewed:    The following studies were reviewed today:  TTE: 10/12/17  Study Conclusions  - Left ventricle: The cavity size was normal. Wall thickness was increased in a pattern of mild LVH. Systolic function was normal. The estimated ejection fraction was in the range of 55% to 60%. Wall motion was normal; there were no regional wall motion abnormalities. Left ventricular diastolic function parameters were normal.  Impressions:  - Normal LV systolic function; mild diastolic dysfunction; mild LVH.  Procedures   LEFT HEART CATH AND CORS/GRAFTS ANGIOGRAPHY  Conclusion    Conclusions: 1. Severe 3-vessel coronary artery disease with chronic total occlusions of the proximal/mid LAD, mid/distal LCx, and proximal RCA. 2. Moderate caliber D1 and distal rPDA demonstrate 70-80% stenoses and are not revascularized. 3. Widely patent LIMA-LAD, SVG-OM2, and SVG-rPDA. 4. Moderately elevated left ventricular filling pressure (LVEDP 28 mmHg).  Recommendations: 1. Optimize medical therapy. OM1 and mid/distal LCx occlusions are new since 2011 but involve relatively small territories. Additionally, faint collaterals are present. I suspect chest pain was due to demand ischemia in the setting of atrial flutter, though troponin elevation seems a bit excessive for supply-demand mismatch. 2. If Marvin Salinas has recurrent chest pain despite aggressive medical therapy, PCI to D1 could be considered in the future. Distal rPDA stenosis is too small/distal for percutaneous intervention. 3. No further IV fluids at this time. Consider gentle diuresis beginning tomorrow as renal function allows, given moderately elevated LVEDP. 4. Restart heparin in 4 hours. Given recent atrial flutter, Marvin Salinas would benefit from anticoagulation in addition to antiplatelet therapy (favor NOAC + clopidogrel or ASA).  Nelva Bush, MD      EKG:  EKG is not ordered today.   Recent Labs: 10/11/2017: ALT 35; B Natriuretic Peptide 140.6; Magnesium 2.2; TSH 1.498 10/14/2017: BUN 24; Creatinine, Ser 1.81; Hemoglobin 13.0; Platelets 233; Potassium 4.3; Sodium 137   Recent Lipid Panel    Component Value Date/Time   CHOL 171 10/12/2017 0411   TRIG 92 10/12/2017 0411   HDL 42 10/12/2017 0411   CHOLHDL 4.1 10/12/2017 0411   VLDL 18 10/12/2017 0411   LDLCALC 111 (H) 10/12/2017 0411    Physical Exam:    VS:  BP 134/72 (BP Location: Right Arm)   Pulse 62   Ht '5\' 8"'  (1.727 m)   Wt 186 lb 4 oz (84.5 kg)   BMI 28.32 kg/m     Wt Readings from Last 3 Encounters:  10/26/17 186 lb 4 oz (84.5  kg)  10/14/17 184 lb (83.5 kg)  08/30/17 187 lb 9.6 oz (85.1 kg)     Physical Exam  Constitutional: He is oriented to person, place, and time. He appears well-developed and well-nourished. No distress.  HENT:  Head: Normocephalic and atraumatic.  Neck: Normal range of motion. Neck supple. No JVD present.  Cardiovascular: Normal rate, regular rhythm, normal heart sounds and intact distal pulses. Exam reveals no gallop and no friction rub.  No murmur heard. Pulmonary/Chest: Effort normal and breath sounds normal. No respiratory distress. He has no wheezes. He has no rales.  Abdominal: Soft. Bowel sounds are normal.  Musculoskeletal: Normal range of motion. He exhibits no edema or deformity.  Neurological: He is alert and oriented to person, place, and time.  Skin: Skin is warm and dry.  Psychiatric: He has a normal mood and affect. His behavior is normal. Thought content normal.  Vitals reviewed.   ASSESSMENT:    1. Coronary artery disease involving native coronary artery of native heart without angina pectoris   2. Typical atrial flutter ([place])   3. Chronic kidney disease (CKD), stage III (moderate) (HCC)   4. Essential hypertension   5. Hyperlipidemia, unspecified hyperlipidemia type   6. Type 2 diabetes mellitus with complication, with long-term current use of insulin (HCC)    PLAN:    In order of problems listed above:  CAD: Recent  NSTEMI with no good targets for revascularization.  He has modest disease in the first diagonal that does not look acute.  He has a history of CABG in 2011 and grafts are patent by cath.  His symptoms and elevated troponin may have been a results of aflutter with RVR. He continues to be managed medically.  He continues on Plavix for CAD but aspirin was discontinued in the setting of anticoagulation.  Echocardiogram showed normal EF and mild diastolic dysfunction. Today he has no chest discomfort or dyspnea. Continue current therapy.   Atrial flutter:  Newly diagnosed in April with RVR and very symptomatic.  Spontaneously converted to normal sinus rhythm in the hospital.  He is continued on beta-blocker, low dose due to bradycardia. CHA2DS2-VASc Scoreof 6 (Vasc dz, HTN, age, DM, TIA (2)).  He has been started on Xarelto for stroke risk reduction at a dosage of 15 mg daily due to renal insufficiency.  He has had no perceived recurrences and heart rate is well controlled today. I reviewed with the patient that if he has recurrent atrial flutter he can try an extra dose of metoprolol, but will likely need treatment-call office or go to hospital based on severity of symptoms. We also discussed that if he has recurrence, he may be a candidate for ablation and would do EP consult.  CKD: Followed by nephrology. Had labs done a few days ago at PCP and was told that everything was good.   Hypertension: Benicar/HCTZ. BP well controlled.   Hyperlipidemia: The patient has been intolerant to statins in the past.  He is currently tolerating pravastatin.  LDL is still above goal at 111.  He has been started on Zetia.  If he does not reach goal LDLof < 70, will consider PCSK9 inhibitor  Diabetes type 2 on insulin: Metformin was discontinued due to renal function.  He is continued on Lantus insulin. Management per PCP with appointment this week.   PAD: Pt has significant claudication of the legs with activity. He is followed by Dr. Fletcher Anon. Continues on statin and plavix.    Medication Adjustments/Labs and Tests Ordered: Current medicines are reviewed at length  with the patient today.  Concerns regarding medicines are outlined above. Labs and tests ordered and medication changes are outlined in the patient instructions below:  Patient Instructions  Medication Instructions:  Your physician recommends that you continue on your current medications as directed. Please refer to the Current Medication list given to you today.   Labwork: None  ordered  Testing/Procedures: None ordered  Follow-Up: Your physician recommends that you have a follow-up appointment on 01/25/18 at 9:30 AM with Truitt Merle, NP   Any Other Special Instructions Will Be Listed Below (If Applicable).     If you need a refill on your cardiac medications before your next appointment, please call your pharmacy.      Signed, Daune Perch, NP  10/26/2017 12:27 PM    [facility]

## 2017-10-27 ENCOUNTER — Inpatient Hospital Stay (HOSPITAL_COMMUNITY)
Admission: EM | Admit: 2017-10-27 | Discharge: 2017-10-31 | DRG: 303 | Disposition: A | Discharge status: Home / Self Care | Payer: Medicare Other | Attending: Internal Medicine | Admitting: Internal Medicine

## 2017-10-27 ENCOUNTER — Encounter (HOSPITAL_COMMUNITY): Payer: Self-pay | Admitting: Emergency Medicine

## 2017-10-27 ENCOUNTER — Other Ambulatory Visit: Payer: Self-pay

## 2017-10-27 ENCOUNTER — Emergency Department (HOSPITAL_COMMUNITY): Payer: Medicare Other

## 2017-10-27 DIAGNOSIS — I771 Stricture of artery: Secondary | ICD-10-CM | POA: Diagnosis present

## 2017-10-27 DIAGNOSIS — R7989 Other specified abnormal findings of blood chemistry: Secondary | ICD-10-CM | POA: Diagnosis present

## 2017-10-27 DIAGNOSIS — Z951 Presence of aortocoronary bypass graft: Secondary | ICD-10-CM

## 2017-10-27 DIAGNOSIS — N179 Acute kidney failure, unspecified: Secondary | ICD-10-CM | POA: Diagnosis present

## 2017-10-27 DIAGNOSIS — Z87891 Personal history of nicotine dependence: Secondary | ICD-10-CM

## 2017-10-27 DIAGNOSIS — Z7901 Long term (current) use of anticoagulants: Secondary | ICD-10-CM

## 2017-10-27 DIAGNOSIS — I25709 Atherosclerosis of coronary artery bypass graft(s), unspecified, with unspecified angina pectoris: Principal | ICD-10-CM | POA: Diagnosis present

## 2017-10-27 DIAGNOSIS — E1122 Type 2 diabetes mellitus with diabetic chronic kidney disease: Secondary | ICD-10-CM | POA: Diagnosis present

## 2017-10-27 DIAGNOSIS — Z7902 Long term (current) use of antithrombotics/antiplatelets: Secondary | ICD-10-CM

## 2017-10-27 DIAGNOSIS — Z885 Allergy status to narcotic agent status: Secondary | ICD-10-CM

## 2017-10-27 DIAGNOSIS — G4730 Sleep apnea, unspecified: Secondary | ICD-10-CM | POA: Diagnosis present

## 2017-10-27 DIAGNOSIS — I10 Essential (primary) hypertension: Secondary | ICD-10-CM | POA: Diagnosis present

## 2017-10-27 DIAGNOSIS — Z888 Allergy status to other drugs, medicaments and biological substances status: Secondary | ICD-10-CM

## 2017-10-27 DIAGNOSIS — Z8679 Personal history of other diseases of the circulatory system: Secondary | ICD-10-CM

## 2017-10-27 DIAGNOSIS — I129 Hypertensive chronic kidney disease with stage 1 through stage 4 chronic kidney disease, or unspecified chronic kidney disease: Secondary | ICD-10-CM | POA: Diagnosis present

## 2017-10-27 DIAGNOSIS — I252 Old myocardial infarction: Secondary | ICD-10-CM

## 2017-10-27 DIAGNOSIS — N184 Chronic kidney disease, stage 4 (severe): Secondary | ICD-10-CM | POA: Diagnosis present

## 2017-10-27 DIAGNOSIS — R9431 Abnormal electrocardiogram [ECG] [EKG]: Secondary | ICD-10-CM | POA: Diagnosis present

## 2017-10-27 DIAGNOSIS — Z91048 Other nonmedicinal substance allergy status: Secondary | ICD-10-CM

## 2017-10-27 DIAGNOSIS — E1151 Type 2 diabetes mellitus with diabetic peripheral angiopathy without gangrene: Secondary | ICD-10-CM | POA: Diagnosis present

## 2017-10-27 DIAGNOSIS — Z794 Long term (current) use of insulin: Secondary | ICD-10-CM

## 2017-10-27 DIAGNOSIS — I679 Cerebrovascular disease, unspecified: Secondary | ICD-10-CM | POA: Diagnosis present

## 2017-10-27 DIAGNOSIS — I4891 Unspecified atrial fibrillation: Secondary | ICD-10-CM | POA: Insufficient documentation

## 2017-10-27 DIAGNOSIS — I214 Non-ST elevation (NSTEMI) myocardial infarction: Secondary | ICD-10-CM | POA: Diagnosis not present

## 2017-10-27 DIAGNOSIS — E1165 Type 2 diabetes mellitus with hyperglycemia: Secondary | ICD-10-CM | POA: Diagnosis present

## 2017-10-27 DIAGNOSIS — F17200 Nicotine dependence, unspecified, uncomplicated: Secondary | ICD-10-CM | POA: Diagnosis present

## 2017-10-27 DIAGNOSIS — G629 Polyneuropathy, unspecified: Secondary | ICD-10-CM | POA: Diagnosis present

## 2017-10-27 DIAGNOSIS — I248 Other forms of acute ischemic heart disease: Secondary | ICD-10-CM | POA: Diagnosis present

## 2017-10-27 DIAGNOSIS — R079 Chest pain, unspecified: Secondary | ICD-10-CM

## 2017-10-27 DIAGNOSIS — I4819 Other persistent atrial fibrillation: Secondary | ICD-10-CM | POA: Diagnosis present

## 2017-10-27 DIAGNOSIS — I484 Atypical atrial flutter: Secondary | ICD-10-CM | POA: Diagnosis present

## 2017-10-27 DIAGNOSIS — Z886 Allergy status to analgesic agent status: Secondary | ICD-10-CM

## 2017-10-27 DIAGNOSIS — Z8673 Personal history of transient ischemic attack (TIA), and cerebral infarction without residual deficits: Secondary | ICD-10-CM

## 2017-10-27 DIAGNOSIS — E785 Hyperlipidemia, unspecified: Secondary | ICD-10-CM | POA: Diagnosis present

## 2017-10-27 DIAGNOSIS — R402413 Glasgow coma scale score 13-15, at hospital admission: Secondary | ICD-10-CM | POA: Diagnosis present

## 2017-10-27 DIAGNOSIS — Z955 Presence of coronary angioplasty implant and graft: Secondary | ICD-10-CM

## 2017-10-27 HISTORY — DX: Unspecified atrial fibrillation: I48.91

## 2017-10-27 LAB — BASIC METABOLIC PANEL
Anion gap: 8 (ref 5–15)
BUN: 28 mg/dL — ABNORMAL HIGH (ref 6–20)
CO2: 25 mmol/L (ref 22–32)
Calcium: 9.7 mg/dL (ref 8.9–10.3)
Chloride: 105 mmol/L (ref 101–111)
Creatinine, Ser: 2.12 mg/dL — ABNORMAL HIGH (ref 0.61–1.24)
GFR calc Af Amer: 34 mL/min — ABNORMAL LOW (ref >60)
GFR calc non Af Amer: 29 mL/min — ABNORMAL LOW (ref >60)
Glucose, Bld: 346 mg/dL — ABNORMAL HIGH (ref 65–99)
Potassium: 4.5 mmol/L (ref 3.5–5.1)
Sodium: 138 mmol/L (ref 135–145)

## 2017-10-27 LAB — I-STAT TROPONIN, ED: Troponin i, poc: 0.02 ng/mL (ref 0.00–0.08)

## 2017-10-27 LAB — GLUCOSE, CAPILLARY
Glucose-Capillary: 212 mg/dL — ABNORMAL HIGH (ref 65–99)
Glucose-Capillary: 188 mg/dL — ABNORMAL HIGH (ref 65–99)

## 2017-10-27 LAB — CBG MONITORING, ED
Glucose-Capillary: 234 mg/dL — ABNORMAL HIGH (ref 65–99)
Glucose-Capillary: 188 mg/dL — ABNORMAL HIGH (ref 65–99)
Glucose-Capillary: 155 mg/dL — ABNORMAL HIGH (ref 65–99)
Glucose-Capillary: 75 mg/dL (ref 65–99)

## 2017-10-27 LAB — CBC
HCT: 43.9 % (ref 39.0–52.0)
Hemoglobin: 14.7 g/dL (ref 13.0–17.0)
MCH: 30.4 pg (ref 26.0–34.0)
MCHC: 33.5 g/dL (ref 30.0–36.0)
MCV: 90.7 fL (ref 78.0–100.0)
Platelets: 260 10*3/uL (ref 150–400)
RBC: 4.84 MIL/uL (ref 4.22–5.81)
RDW: 13.5 % (ref 11.5–15.5)
WBC: 8.5 10*3/uL (ref 4.0–10.5)

## 2017-10-27 LAB — TROPONIN I
Troponin I: 0.19 ng/mL (ref <0.03)
Troponin I: 0.66 ng/mL (ref <0.03)
Troponin I: 0.49 ng/mL (ref <0.03)

## 2017-10-27 LAB — TSH: TSH: 0.903 u[IU]/mL (ref 0.350–4.500)

## 2017-10-27 MED ORDER — DILTIAZEM HCL-DEXTROSE 100-5 MG/100ML-% IV SOLN (PREMIX)
5.0000 mg/h | INTRAVENOUS | Status: DC
  Filled 2017-10-27: qty 100

## 2017-10-27 MED ORDER — AMIODARONE HCL 200 MG PO TABS
200.0000 mg | ORAL_TABLET | Freq: Two times a day (BID) | ORAL | Status: DC
  Filled 2017-10-27: qty 1

## 2017-10-27 MED ORDER — OLMESARTAN MEDOXOMIL-HCTZ 20-12.5 MG PO TABS: 1.0000 | ORAL_TABLET | Freq: Every day | ORAL | Status: DC

## 2017-10-27 MED ORDER — ACETAMINOPHEN 325 MG PO TABS: 650.0000 mg | ORAL_TABLET | ORAL | Status: DC | PRN

## 2017-10-27 MED ORDER — FLUTICASONE PROPIONATE 50 MCG/ACT NA SUSP
1.0000 | Freq: Every day | NASAL | Status: DC | PRN
  Administered 2017-10-30: 1 via NASAL
  Filled 2017-10-27: qty 16

## 2017-10-27 MED ORDER — INSULIN ASPART 100 UNIT/ML ~~LOC~~ SOLN
0.0000 [IU] | SUBCUTANEOUS | Status: DC
  Administered 2017-10-27 (×3): 2 [IU] via SUBCUTANEOUS
  Administered 2017-10-27 – 2017-10-28 (×2): 3 [IU] via SUBCUTANEOUS
  Administered 2017-10-28 (×2): 2 [IU] via SUBCUTANEOUS
  Administered 2017-10-29: 3 [IU] via SUBCUTANEOUS
  Administered 2017-10-29: 2 [IU] via SUBCUTANEOUS
  Filled 2017-10-27: qty 1

## 2017-10-27 MED ORDER — RIVAROXABAN 15 MG PO TABS
15.0000 mg | ORAL_TABLET | Freq: Every day | ORAL | Status: DC
  Administered 2017-10-28 – 2017-10-30 (×3): 15 mg via ORAL
  Filled 2017-10-27 (×4): qty 1

## 2017-10-27 MED ORDER — PRAVASTATIN SODIUM 40 MG PO TABS
80.0000 mg | ORAL_TABLET | Freq: Every day | ORAL | Status: DC
  Administered 2017-10-27 – 2017-10-30 (×4): 80 mg via ORAL
  Filled 2017-10-27 (×4): qty 2

## 2017-10-27 MED ORDER — LORATADINE 10 MG PO TABS: 10.0000 mg | ORAL_TABLET | Freq: Every day | ORAL | Status: DC | PRN

## 2017-10-27 MED ORDER — FENOFIBRATE 160 MG PO TABS
160.0000 mg | ORAL_TABLET | Freq: Every day | ORAL | Status: DC
  Administered 2017-10-27 – 2017-10-31 (×5): 160 mg via ORAL
  Filled 2017-10-27 (×5): qty 1

## 2017-10-27 MED ORDER — NITROGLYCERIN 0.4 MG SL SUBL: 0.4000 mg | SUBLINGUAL_TABLET | SUBLINGUAL | Status: DC | PRN

## 2017-10-27 MED ORDER — DILTIAZEM LOAD VIA INFUSION
5.0000 mg | Freq: Once | INTRAVENOUS | Status: DC
  Filled 2017-10-27: qty 5

## 2017-10-27 MED ORDER — OMEGA-3-ACID ETHYL ESTERS 1 G PO CAPS
2.0000 g | ORAL_CAPSULE | Freq: Every day | ORAL | Status: DC
  Administered 2017-10-27 – 2017-10-31 (×5): 2 g via ORAL
  Filled 2017-10-27 (×5): qty 2

## 2017-10-27 MED ORDER — HEPARIN (PORCINE) IN NACL 100-0.45 UNIT/ML-% IJ SOLN: 1100.0000 [IU]/h | INTRAMUSCULAR | Status: DC

## 2017-10-27 MED ORDER — HEPARIN (PORCINE) IN NACL 100-0.45 UNIT/ML-% IJ SOLN
1200.0000 [IU]/h | INTRAMUSCULAR | Status: DC
  Filled 2017-10-27: qty 250

## 2017-10-27 MED ORDER — METOPROLOL TARTRATE 12.5 MG HALF TABLET
12.5000 mg | ORAL_TABLET | Freq: Two times a day (BID) | ORAL | Status: DC
  Administered 2017-10-27 – 2017-10-31 (×9): 12.5 mg via ORAL
  Filled 2017-10-27 (×9): qty 1

## 2017-10-27 MED ORDER — EZETIMIBE 10 MG PO TABS
10.0000 mg | ORAL_TABLET | Freq: Every day | ORAL | Status: DC
  Administered 2017-10-27 – 2017-10-30 (×4): 10 mg via ORAL
  Filled 2017-10-27 (×5): qty 1

## 2017-10-27 MED ORDER — CLOPIDOGREL BISULFATE 75 MG PO TABS
75.0000 mg | ORAL_TABLET | Freq: Every day | ORAL | Status: DC
  Administered 2017-10-27 – 2017-10-31 (×5): 75 mg via ORAL
  Filled 2017-10-27 (×5): qty 1

## 2017-10-27 MED ORDER — RIVAROXABAN 15 MG PO TABS: 15.0000 mg | ORAL_TABLET | Freq: Every day | ORAL | Status: DC

## 2017-10-27 MED ORDER — INSULIN GLARGINE 100 UNIT/ML ~~LOC~~ SOLN
35.0000 [IU] | Freq: Two times a day (BID) | SUBCUTANEOUS | Status: DC
  Administered 2017-10-27 – 2017-10-29 (×5): 35 [IU] via SUBCUTANEOUS
  Administered 2017-10-29: 25 [IU] via SUBCUTANEOUS
  Administered 2017-10-30 – 2017-10-31 (×3): 35 [IU] via SUBCUTANEOUS
  Filled 2017-10-27 (×9): qty 0.35

## 2017-10-27 MED ORDER — HYDROCHLOROTHIAZIDE 12.5 MG PO CAPS
12.5000 mg | ORAL_CAPSULE | Freq: Every day | ORAL | Status: DC
  Administered 2017-10-27 – 2017-10-29 (×3): 12.5 mg via ORAL
  Filled 2017-10-27 (×3): qty 1

## 2017-10-27 MED ORDER — ONDANSETRON HCL 4 MG/2ML IJ SOLN: 4.0000 mg | Freq: Four times a day (QID) | INTRAMUSCULAR | Status: DC | PRN

## 2017-10-27 MED ORDER — IRBESARTAN 300 MG PO TABS
150.0000 mg | ORAL_TABLET | Freq: Every day | ORAL | Status: DC
  Administered 2017-10-27 – 2017-10-28 (×2): 150 mg via ORAL
  Filled 2017-10-27 (×2): qty 1

## 2017-10-27 NOTE — ED Notes (Signed)
  Patient has self resolved from Afib.  Rate noted at 72 in sinus rhythm.  Dr. Betsey Holiday notified.

## 2017-10-27 NOTE — Consult Note (Addendum)
Cardiology Consultation:   Patient ID: Marvin Salinas; [id number]; 03/15/1946   Admit date: 10/27/2017 Date of Consult: 10/27/2017  Primary Care Provider: Aletha Halim., PA-C Primary Cardiologist: Mertie Moores, MD  PV: Dr. Fletcher Anon   Patient Profile:   Marvin Salinas is a 72 y.o. male with a hx of CAD s/p CABG in 2011, recent admission for new onset atrial flutter and NSTEMI with cath showing severe, native, 3 vessel CAD and 3/3 patent grafts but new OM1 and mid/distal LCx occlusions, treated medically. Also h/o HTN, HLD, T2DM, extensive cerebrovascular disease w/ prior TIAs and known basilar artery stenosis as well as bilateral LE PAD, stage III CKD and tobacco abuse, who is being seen today for the evaluation of flutter w/ RVR and recurrent chest pain, at the request of Dr. Tana Coast, Internal Medicine.  History of Present Illness:   As outlined above, Marvin Salinas was recently admitted to [facility] from 4/23-4/26/2019 for chest pain and was found to be in new onset atrial flutter w/ RVR with markedly elevated troponin, peaking at 25.4, ruling in for NSTEMI. He was placed on IV heparin. 2D Echo showed normal LVEF at 55-60%. Cardiac Cath on 10/13/17 showed showed severe three-vessel CAD with chronic total occlusions of the proximal/mid LAD, mid-distal LCx and proximal RCA (lesions did not appear to be acute). The OM1 and mid/distal LCx occlusions were noted to be new since 2011, but relatively small territories. Faint collaterals were noted to be present. All 3 grafts were patent (LIMA-LAD, SVG-OM2 and SVG-rPDA). Based on cath findings, it was felt that his CP was likely due to demand ischemia, in the setting of atrial flutter. Aggressive medical therapy was recommended, but internationalist did note that it he had any recurrent CP, despite aggressive medical therapy, then PCI to the D1 could be considered in the future. The distal rPDA however is too small and too distal for PCI.   He was treated with Plavix. No  ASA given need for anticoagulation for atrial flutter (CHA2DS2 VASc score of 6 for HTN, DM, Vascular Dz, TIA (2) and Age > 65). Pravastatin was continued (reported histoy of statin intolerance). LDL was elevated at 111 mg/dL. Zetia was added to his regimen. He was also treated with beta blocker (low dose due to bradycardia).  In regards to his atrial flutter, he spontaneously converted to NSR. Xarelto was added for a/c given CHA2DS2 VASc score and stroke risk. He was placed on 15 mg daily due to renal function. It was noted by Dr. Martinique to consider referral for atrial flutter ablation if he has recurrent flutter.   He was discharged home in stable condition of 10/14/17. He had post hospital f/u in clinic on 10/26/17. He was doing fine at the visit w/o CP and no palpitations. However, later in the evening, he developed recurrent CP.  He noted substernal CP with radiation to both jaws. Also had palpitations. He took nitro at home with little relief.  In the ED, he was noted to be in atrial flutter w/ RVR, 150 bpm. However the EDP was hesitant to attempt cardioversion as he has not been on anticoagulation for more than 3 weeks. He was also borderline hypotensive, thus no room to start IV Cardizem. He was admitted to medicine service and started on IV heparin with plans to consider repeat cath.  He spontaneously converted back to NSR. Pain resolved once back in SR. Telemetry currently shows sinus bradycardia in the 50s. He denies any recurrent CP. No dyspnea.  Initial troponin today was 0.19. Repeat trop 0.66. SCr is 2.12 (peaked at 2.83 previous admission but improved to 1.81 day of d/c 4/26). Glucose high in the 300s. He reports full med compliance. He denies any triggers.    Past Medical History:  Diagnosis Date  . Back pain   . Basilar artery stenosis    on chronic Plavix  . Cerebral vascular disease    with prior TIA's; followed by Dr. Erling Cruz  . Depression   . Diabetes mellitus    on insulin  .  History of kidney stones   . History of renal calculi   . Hyperlipidemia   . Hypertension   . Ischemic heart disease    prior PCI to RCA in 1989. S/P PCI to LAD and OM in 1992. S/P PCI to first DX in 2000. S/P CABG x 3 in May 2011  . Peripheral neuropathy   . Pneumonia Sept 2012  . S/P CABG x 21 Oct 2009  . Sleep apnea     Past Surgical History:  Procedure Laterality Date  . ANGIOPLASTY  1989   right coronary artery  . ANGIOPLASTY  1992   LAD and OM  . ANGIOPLASTY  1998   First DX  . CORONARY ARTERY BYPASS GRAFT  11/12/2009   LIMA to LAD, SVG to OM and SVG to RCA  . CORONARY STENT PLACEMENT  2000   Stent to LAD/Circumflex with angioplasty to first diagonal   . LEFT HEART CATH AND CORS/GRAFTS ANGIOGRAPHY N/A 10/13/2017   Procedure: LEFT HEART CATH AND CORS/GRAFTS ANGIOGRAPHY;  Surgeon: Nelva Bush, MD;  Location: [place] [facility];  Service: Cardiovascular;  Laterality: N/A;     Home Medications:  Prior to Admission medications   Medication Sig Start Date End Date Taking? Authorizing Provider  Artificial Tear Ointment (DRY EYES OP) Place 1 drop into both eyes daily. Gen teal   Yes [provider]  clopidogrel (PLAVIX) 75 MG tablet Take 1 tablet (75 mg total) by mouth daily. 11/01/16  Yes Acelynn Dejonge, Wonda Cheng, MD  ezetimibe (ZETIA) 10 MG tablet Take 1 tablet (10 mg total) by mouth daily. 10/14/17  Yes Rama, Venetia Maxon, MD  fluticasone (FLONASE) 50 MCG/ACT nasal spray Place 1 spray into both nostrils daily as needed for allergies.    Yes [provider]  Hypertonic Nasal Wash (SINUS RINSE BOTTLE KIT NA) Place 1 spray into the nose daily as needed (congrestion).    Yes [provider]  insulin glargine (LANTUS) 100 UNIT/ML injection Inject 35 Units into the skin 2 (two) times daily.    Yes [provider]  loratadine (CLARITIN) 10 MG tablet Take 10 mg by mouth daily as needed for allergies.    Yes [provider]  metoprolol tartrate  (LOPRESSOR) 25 MG tablet Take 0.5 tablets (12.5 mg total) by mouth 2 (two) times daily. 10/14/17  Yes Rama, Venetia Maxon, MD  nitroGLYCERIN (NITROSTAT) 0.4 MG SL tablet Place 1 tablet (0.4 mg total) under the tongue every 5 (five) minutes as needed for chest pain. 10/14/17  Yes Rama, Venetia Maxon, MD  olmesartan-hydrochlorothiazide (BENICAR HCT) 20-12.5 MG tablet TAKE 1 TABLET BY MOUTH EVERY DAY 11/24/16  Yes Annahi Short, Wonda Cheng, MD  Omega-3 Fatty Acids (FISH OIL) 1000 MG CAPS Take 1,000 mg by mouth 2 (two) times daily.   Yes [provider]  ondansetron (ZOFRAN ODT) 4 MG disintegrating tablet Take one tab by mouth Q6hr prn nausea.  Dissolve under tongue. 08/06/17  Yes Beese,  Ishmael Holter, MD  pravastatin (PRAVACHOL) 80 MG tablet Take 80 mg by mouth at bedtime.    Yes [provider]  Rivaroxaban (XARELTO) 15 MG TABS tablet Take 1 tablet (15 mg total) by mouth daily with supper. 10/14/17  Yes Rama, Venetia Maxon, MD  TRICOR 145 MG tablet TAKE 1 TABLET BY MOUTH EVERY DAY 02/04/11  Yes Burtis Junes, NP    Inpatient Medications: Scheduled Meds: . clopidogrel  75 mg Oral Daily  . ezetimibe  10 mg Oral Daily  . fenofibrate  160 mg Oral Daily  . hydrochlorothiazide  12.5 mg Oral Daily  . insulin aspart  0-9 Units Subcutaneous Q4H  . insulin glargine  35 Units Subcutaneous BID  . irbesartan  150 mg Oral Daily  . metoprolol tartrate  12.5 mg Oral BID  . omega-3 acid ethyl esters  2 g Oral Daily  . pravastatin  80 mg Oral QHS  . Rivaroxaban  15 mg Oral Q supper   Continuous Infusions:  PRN Meds: acetaminophen, fluticasone, loratadine, nitroGLYCERIN, ondansetron (ZOFRAN) IV  Allergies:    Allergies  Allergen Reactions  . Codeine Nausea And Vomiting  . Lisinopril Cough    Other reaction(s): Cough (ALLERGY/intolerance)  . Nsaids     Other reaction(s): Other (See Comments) CKD Other reaction(s): Other CKD  . Latex Hives    Social History:   Social History   Socioeconomic History   . Marital status: Divorced    Spouse name: Not on file  . Number of children: 0  . Years of education: GED  . Highest education level: Not on file  Occupational History  . Occupation: retired  Scientific laboratory technician  . Financial resource strain: Not on file  . Food insecurity:    Worry: Not on file    Inability: Not on file  . Transportation needs:    Medical: Not on file    Non-medical: Not on file  Tobacco Use  . Smoking status: Former Smoker    Packs/day: 1.00    Years: 54.00    Pack years: 54.00    Types: Cigarettes    Last attempt to quit: 10/19/2009    Years since quitting: 8.0  . Smokeless tobacco: Never Used  Substance and Sexual Activity  . Alcohol use: No  . Drug use: No  . Sexual activity: Never  Lifestyle  . Physical activity:    Days per week: Not on file    Minutes per session: Not on file  . Stress: Not on file  Relationships  . Social connections:    Talks on phone: Not on file    Gets together: Not on file    Attends religious service: Not on file    Active member of club or organization: Not on file    Attends meetings of clubs or organizations: Not on file    Relationship status: Not on file  . Intimate partner violence:    Fear of current or ex partner: Not on file    Emotionally abused: Not on file    Physically abused: Not on file    Forced sexual activity: Not on file  Other Topics Concern  . Not on file  Social History Narrative   Patient is Divorced.   Patient has his GED.   Patient is right handed.   Patient does not drink any caffeine.    Family History:    Family History  Problem Relation Age of Onset  . Hypertension Father   . Heart disease  Father   . Heart attack Father   . Asthma Brother   . Stroke Unknown        Uncle     ROS:  Please see the history of present illness.   All other ROS reviewed and negative.     Physical Exam/Data:   Vitals:   10/27/17 1045 10/27/17 1115 10/27/17 1145 10/27/17 1215  BP: 131/71 126/73  122/65 140/70  Pulse: (!) 58 (!) 54 (!) 56 (!) 56  Resp: '13 20 19 19  ' Temp:      TempSrc:      SpO2: 97% 96% 96% 96%  Weight:      Height:       No intake or output data in the 24 hours ending 10/27/17 1257 Filed Weights   10/27/17 0036  Weight: 180 lb (81.6 kg)   Body mass index is 27.37 kg/m.  General:  Well nourished, well developed, in no acute distress HEENT: normal Lymph: no adenopathy Neck: no JVD Endocrine:  No thryomegaly Vascular: No carotid bruits; FA pulses 2+ bilaterally without bruits  Cardiac:  normal S1, S2; RRR; no murmur Lungs:  clear to auscultation bilaterally, no wheezing, rhonchi or rales  Abd: soft, nontender, no hepatomegaly  Ext: no edema Musculoskeletal:  No deformities, BUE and BLE strength normal and equal Skin: warm and dry  Neuro:  CNs 2-12 intact, no focal abnormalities noted Psych:  Normal affect   EKG:  The EKG was personally reviewed and demonstrates:  Atrial fibrillation/flutter 150s Telemetry:  Telemetry was personally reviewed and demonstrates:  NSR/ sinus bradycardia 50s  Relevant CV Studies:  Procedures   LEFT HEART CATH AND CORS/GRAFTS ANGIOGRAPHY 10/13/17  Conclusion   Conclusions: 1. Severe 3-vessel coronary artery disease with chronic total occlusions of the proximal/mid LAD, mid/distal LCx, and proximal RCA. 2. Moderate caliber D1 and distal rPDA demonstrate 70-80% stenoses and are not revascularized. 3. Widely patent LIMA-LAD, SVG-OM2, and SVG-rPDA. 4. Moderately elevated left ventricular filling pressure (LVEDP 28 mmHg).  Recommendations: 1. Optimize medical therapy.  OM1 and mid/distal LCx occlusions are new since 2011 but involve relatively small territories.  Additionally, faint collaterals are present.  I suspect chest pain was due to demand ischemia in the setting of atrial flutter, though troponin elevation seems a bit excessive for supply-demand mismatch. 2. If Mr. Babington has recurrent chest pain despite aggressive  medical therapy, PCI to D1 could be considered in the future.  Distal rPDA stenosis is too small/distal for percutaneous intervention. 3. No further IV fluids at this time.  Consider gentle diuresis beginning tomorrow as renal function allows, given moderately elevated LVEDP. 4. Restart heparin in 4 hours.  Given recent atrial flutter, Mr. Zacharia would benefit from anticoagulation in addition to antiplatelet therapy (favor NOAC + clopidogrel or ASA).   2D Echo 10/12/17 Study Conclusions  - Left ventricle: The cavity size was normal. Wall thickness was   increased in a pattern of mild LVH. Systolic function was normal.   The estimated ejection fraction was in the range of 55% to 60%.   Wall motion was normal; there were no regional wall motion   abnormalities. Left ventricular diastolic function parameters   were normal.  Impressions:  - Normal LV systolic function; mild diastolic dysfunction; mild   LVH.  Laboratory Data:  Chemistry Recent Labs  Lab 10/27/17 0038  NA 138  K 4.5  CL 105  CO2 25  GLUCOSE 346*  BUN 28*  CREATININE 2.12*  CALCIUM 9.7  GFRNONAA 29*  GFRAA 34*  ANIONGAP 8    No results for input(s): PROT, ALBUMIN, AST, ALT, ALKPHOS, BILITOT in the last 168 hours. Hematology Recent Labs  Lab 10/27/17 0038  WBC 8.5  RBC 4.84  HGB 14.7  HCT 43.9  MCV 90.7  MCH 30.4  MCHC 33.5  RDW 13.5  PLT 260   Cardiac Enzymes Recent Labs  Lab 10/27/17 0347 10/27/17 0935  TROPONINI 0.19* 0.66*    Recent Labs  Lab 10/27/17 0044  TROPIPOC 0.02    BNPNo results for input(s): BNP, PROBNP in the last 168 hours.  DDimer No results for input(s): DDIMER in the last 168 hours.  Radiology/Studies:  Dg Chest 2 View  Result Date: 10/27/2017 CLINICAL DATA:  72 year old male with chest pain. EXAM: CHEST - 2 VIEW COMPARISON:  Chest radiograph dated 10/11/2017 FINDINGS: The lungs are clear. There is no pleural effusion or pneumothorax. The cardiac silhouette is within  normal limits. Median sternotomy wires and CABG changes noted. No acute osseous pathology. IMPRESSION: No active cardiopulmonary disease. Electronically Signed   By: Anner Crete M.D.   On: 10/27/2017 01:54    Assessment and Plan:   Marvin Salinas is a 72 y.o. male with a hx of CAD s/p CABG in 2011, recent admission for new onset atrial flutter and NSTEMI with cath showing severe, native, 3 vessel CAD and 3/3 patent grafts but new OM1 and mid/distal LCx occlusions, treated medically. Also h/o HTN, HLD, T2DM, extensive cerebrovascular disease w/ prior TIAs and known basilar artery stenosis as well as bilateral LE PAD, stage III CKD and tobacco abuse, who is being seen today for the evaluation of atrial flutter w/ RVR and recurrent chest pain, at the request of Dr. Tana Coast, Internal Medicine.  1. Paroxsymal Atrial Flutter w/ RVR: 2nd documented occurrence in the past month. Initial rates in the 150s and symptomatic with CP. He denies any triggers and reports full compliance with metoprolol and Xarelto. TSH WNL. K normal at 4.5. He had spontaneous conversion last night and symptoms resolve. Bradycardia limits further titration of AV nodal blocking agents. Will ask EP to see for flutter ablation.    2. CAD: s/p CABG x 3 in 2011. Recent NSTEMi 09/2017. Troponin spiked to 25.4. LHC with 3/3 patent grafts (LIMA-LAD, SVG-OM2 and SVG-rPDA). Chronic total occlusions of the proximal/mid LAD, mid-distal LCx and proximal RCA (lesions did not appear to be acute). The OM1 and mid/distal LCx occlusions were noted to be new since 2011, but relatively small territories. Faint collaterals were noted. Aggressive medical therapy initially recommended but can consider PCI of the D1, if recurrent CP. However it seems that he only is symptomatic when he goes into rapid atrial fib/flutter (demand ischemia), thus treatment of his arrhthymias may be the best first step, especially given his abnormal renal function and risk for  contrast induced nephropathy if repeat cath.   3. Acute on Chronic Kidney Disease, Stage III: noted during recent admit to have SCr in the 2 range with GFR in the 30s. SCr peaked to 2.8 prior admit last month and improved to 1.8 day of discharge (last prior SCr for comparison was in 2015 and was 1.96). SCr back up to 2.12 today. HCTZ and irbesartan ordered. ? Holding diuretic. Monitor renal function.   4. HTN: controlled on current regimen.   5. HLD: h/o statin intolerance but tolerating pravastatin. LDL during recent admission last month was elevated at 111 mg/dL. Goal LDL given CAD, PAD and cerebrovascular disease <70 mg/dL. Zetia  was recently added to regimen. We will plan to recheck FLP and HFTs in 6 weeks. If not at goal, will refer to our lipid clinic for PCSK9 inhibitor therapy.   6. Tobacco Abuse: smoking cessation advised.   7. H/o TIA: now on Xarelto given new diagnosis of atrial flutter.   8. PAD: followed by Dr. Fletcher Anon. Vascular studies in 2018 showed mildly reduced ABI on the left side at 0.83.  Duplex showed significant bilateral iliac disease and moderate SFA/popliteal disease with possible significant stenosis affecting the distal left SFA.   For questions or updates, please contact [place] Please consult [email or web address] for contact info under Cardiology/STEMI.   Signed, Lyda Jester, PA-C  10/27/2017 12:57 PM   Attending Note:   The patient was seen and examined.  Agree with assessment and plan as noted above.  Changes made to the above note as needed.  Patient seen and independently examined with  Ellen Henri, PA .   We discussed all aspects of the encounter. I agree with the assessment and plan as stated above.  1.  Coronary artery disease: Laveda Abbe has had bypass surgery.  He had a heart catheterization performed last month.  He has patent grafts.  He has a small diagonal vessel that has a moderately tight stenosis.  The territory of this vessel is fairly  small and the vessel is borderline in size.  We might be able to put a small stent in there but I think the risk of restenosis would be higher than usual.  He does not have any episodes of angina until he goes into rapid atrial flutter.  I think our best course of action is to continue medical therapy for this small vessel and treat his atrial flutter if possible.  2.  Atrial flutter: The patient has had several episodes of atrial flutter with rapid ventricular response.  We will have Dr. Lovena Le see him to see if this is amenable to ablation.  His Xarelto was stopped.  We will start him on heparin for now.   I have spent a total of 40 minutes with patient reviewing hospital  notes , telemetry, EKGs, labs and examining patient as well as establishing an assessment and plan that was discussed with the patient. > 50% of time was spent in direct patient care.    Thayer Headings, Brooke Bonito., MD, [facility] 10/27/2017, 2:51 PM [address]  [place] Pager [phone]

## 2017-10-27 NOTE — ED Notes (Signed)
MD returned call aware of pts troponin, cardiology to see the pt

## 2017-10-27 NOTE — H&P (Signed)
History and Physical    TAIJUAN SERVISS OEU:[id number] DOB: 1946-04-19 DOA: 10/27/2017  PCP: Aletha Halim., PA-C  Patient coming from: Home.  Chief Complaint: Chest pain palpitations.  HPI: Marvin Salinas is a 72 y.o. male with history of CAD status post CABG who was recently admitted for new onset atrial flutter and at that time patient also had non-ST elevation MI underwent cardiac cath and was instructed medical management started developing chest pressure along with palpitation last night around 11 PM at home.  Chest pressure was across the chest nonradiating.  Denies abdominal pain nausea vomiting.  ED Course: In the ER patient was found to be in A. fib with RVR.  Patient spontaneously converted following which patient chest pressure resolved.  Troponin is mildly elevated.  Patient admitted for further observation.  Review of Systems: As per HPI, rest all negative.   Past Medical History:  Diagnosis Date  . Back pain   . Basilar artery stenosis    on chronic Plavix  . Cerebral vascular disease    with prior TIA's; followed by Dr. Erling Cruz  . Depression   . Diabetes mellitus    on insulin  . History of kidney stones   . History of renal calculi   . Hyperlipidemia   . Hypertension   . Ischemic heart disease    prior PCI to RCA in 1989. S/P PCI to LAD and OM in 1992. S/P PCI to first DX in 2000. S/P CABG x 3 in May 2011  . Peripheral neuropathy   . Pneumonia Sept 2012  . S/P CABG x 21 Oct 2009  . Sleep apnea     Past Surgical History:  Procedure Laterality Date  . ANGIOPLASTY  1989   right coronary artery  . ANGIOPLASTY  1992   LAD and OM  . ANGIOPLASTY  1998   First DX  . CORONARY ARTERY BYPASS GRAFT  11/12/2009   LIMA to LAD, SVG to OM and SVG to RCA  . CORONARY STENT PLACEMENT  2000   Stent to LAD/Circumflex with angioplasty to first diagonal   . LEFT HEART CATH AND CORS/GRAFTS ANGIOGRAPHY N/A 10/13/2017   Procedure: LEFT HEART CATH AND CORS/GRAFTS ANGIOGRAPHY;   Surgeon: Nelva Bush, MD;  Location: [facility];  Service: Cardiovascular;  Laterality: N/A;     reports that he quit smoking about 8 years ago. His smoking use included cigarettes. He has a 54.00 pack-year smoking history. He has never used smokeless tobacco. He reports that he does not drink alcohol or use drugs.  Allergies  Allergen Reactions  . Codeine Nausea And Vomiting  . Lisinopril Cough    Other reaction(s): Cough (ALLERGY/intolerance)  . Nsaids     Other reaction(s): Other (See Comments) CKD Other reaction(s): Other CKD  . Latex Hives    Family History  Problem Relation Age of Onset  . Hypertension Father   . Heart disease Father   . Heart attack Father   . Asthma Brother   . Stroke Unknown        Uncle    Prior to Admission medications   Medication Sig Start Date End Date Taking? Authorizing Provider  Artificial Tear Ointment (DRY EYES OP) Place 1 drop into both eyes daily. Gen teal   Yes [provider]  clopidogrel (PLAVIX) 75 MG tablet Take 1 tablet (75 mg total) by mouth daily. 11/01/16  Yes [name], Wonda Cheng, MD  ezetimibe (ZETIA) 10 MG tablet Take 1 tablet (10  mg total) by mouth daily. 10/14/17  Yes Rama, Venetia Maxon, MD  fluticasone (FLONASE) 50 MCG/ACT nasal spray Place 1 spray into both nostrils daily as needed for allergies.    Yes [provider]  Hypertonic Nasal Wash (SINUS RINSE BOTTLE KIT NA) Place 1 spray into the nose daily as needed (congrestion).    Yes [provider]  insulin glargine (LANTUS) 100 UNIT/ML injection Inject 35 Units into the skin 2 (two) times daily.    Yes [provider]  loratadine (CLARITIN) 10 MG tablet Take 10 mg by mouth daily as needed for allergies.    Yes [provider]  metoprolol tartrate (LOPRESSOR) 25 MG tablet Take 0.5 tablets (12.5 mg total) by mouth 2 (two) times daily. 10/14/17  Yes Rama, Venetia Maxon, MD  nitroGLYCERIN (NITROSTAT) 0.4 MG SL tablet Place 1  tablet (0.4 mg total) under the tongue every 5 (five) minutes as needed for chest pain. 10/14/17  Yes Rama, Venetia Maxon, MD  olmesartan-hydrochlorothiazide (BENICAR HCT) 20-12.5 MG tablet TAKE 1 TABLET BY MOUTH EVERY DAY 11/24/16  Yes [name], Wonda Cheng, MD  Omega-3 Fatty Acids (FISH OIL) 1000 MG CAPS Take 1,000 mg by mouth 2 (two) times daily.   Yes [provider]  ondansetron (ZOFRAN ODT) 4 MG disintegrating tablet Take one tab by mouth Q6hr prn nausea.  Dissolve under tongue. 08/06/17  Yes Kandra Nicolas, MD  pravastatin (PRAVACHOL) 80 MG tablet Take 80 mg by mouth at bedtime.    Yes [provider]  Rivaroxaban (XARELTO) 15 MG TABS tablet Take 1 tablet (15 mg total) by mouth daily with supper. 10/14/17  Yes Rama, Venetia Maxon, MD  TRICOR 145 MG tablet TAKE 1 TABLET BY MOUTH EVERY DAY 02/04/11  Yes Burtis Junes, NP    Physical Exam: Vitals:   10/27/17 0200 10/27/17 0215 10/27/17 0230 10/27/17 0245  BP: 105/60 114/68 119/65 124/68  Pulse: 77 74 72 72  Resp: '19 18 20 17  ' Temp:      TempSrc:      SpO2: 95% 96% 96% 98%  Weight:      Height:          Constitutional: Moderately built and nourished. Vitals:   10/27/17 0200 10/27/17 0215 10/27/17 0230 10/27/17 0245  BP: 105/60 114/68 119/65 124/68  Pulse: 77 74 72 72  Resp: '19 18 20 17  ' Temp:      TempSrc:      SpO2: 95% 96% 96% 98%  Weight:      Height:       Eyes: Anicteric no pallor. ENMT: No discharge from the ears eyes nose or mouth. Neck: No mass felt.  No neck rigidity.  No JVD appreciated. Respiratory: No rhonchi or crepitations. Cardiovascular: S1-S2 heard no murmurs appreciated. Abdomen: Soft nontender bowel sounds present. Musculoskeletal: No edema.  No joint effusion. Skin: No rash.  Skin appears warm. Neurologic: Alert awake oriented to time place and person.  Moves all extremities. Psychiatric: Appears normal.  Normal affect.   Labs on Admission: I have personally reviewed following labs and  imaging studies  CBC: Recent Labs  Lab 10/27/17 0038  WBC 8.5  HGB 14.7  HCT 43.9  MCV 90.7  PLT 366   Basic Metabolic Panel: Recent Labs  Lab 10/27/17 0038  NA 138  K 4.5  CL 105  CO2 25  GLUCOSE 346*  BUN 28*  CREATININE 2.12*  CALCIUM 9.7   GFR: Estimated Creatinine Clearance: 30.5 mL/min (A) (by C-G  formula based on SCr of 2.12 mg/dL (H)). Liver Function Tests: No results for input(s): AST, ALT, ALKPHOS, BILITOT, PROT, ALBUMIN in the last 168 hours. No results for input(s): LIPASE, AMYLASE in the last 168 hours. No results for input(s): AMMONIA in the last 168 hours. Coagulation Profile: No results for input(s): INR, PROTIME in the last 168 hours. Cardiac Enzymes: No results for input(s): CKTOTAL, CKMB, CKMBINDEX, TROPONINI in the last 168 hours. BNP (last 3 results) No results for input(s): PROBNP in the last 8760 hours. HbA1C: No results for input(s): HGBA1C in the last 72 hours. CBG: No results for input(s): GLUCAP in the last 168 hours. Lipid Profile: No results for input(s): CHOL, HDL, LDLCALC, TRIG, CHOLHDL, LDLDIRECT in the last 72 hours. Thyroid Function Tests: No results for input(s): TSH, T4TOTAL, FREET4, T3FREE, THYROIDAB in the last 72 hours. Anemia Panel: No results for input(s): VITAMINB12, FOLATE, FERRITIN, TIBC, IRON, RETICCTPCT in the last 72 hours. Urine analysis:    Component Value Date/Time   COLORURINE YELLOW 10/12/2017 0031   APPEARANCEUR HAZY (A) 10/12/2017 0031   LABSPEC 1.021 10/12/2017 0031   PHURINE 5.0 10/12/2017 0031   GLUCOSEU NEGATIVE 10/12/2017 0031   HGBUR NEGATIVE 10/12/2017 0031   BILIRUBINUR NEGATIVE 10/12/2017 0031   KETONESUR 5 (A) 10/12/2017 0031   PROTEINUR 30 (A) 10/12/2017 0031   UROBILINOGEN 0.2 06/16/2014 1211   NITRITE NEGATIVE 10/12/2017 0031   LEUKOCYTESUR NEGATIVE 10/12/2017 0031   Sepsis Labs: '@LABRCNTIP' (procalcitonin:4,lacticidven:4) )No results found for this or any previous visit (from the past  240 hour(s)).   Radiological Exams on Admission: Dg Chest 2 View  Result Date: 10/27/2017 CLINICAL DATA:  72 year old male with chest pain. EXAM: CHEST - 2 VIEW COMPARISON:  Chest radiograph dated 10/11/2017 FINDINGS: The lungs are clear. There is no pleural effusion or pneumothorax. The cardiac silhouette is within normal limits. Median sternotomy wires and CABG changes noted. No acute osseous pathology. IMPRESSION: No active cardiopulmonary disease. Electronically Signed   By: Anner Crete M.D.   On: 10/27/2017 01:54    EKG: Independently reviewed.  A. fib with RVR.  Assessment/Plan Principal Problem:   Chest pain Active Problems:   S/P CABG x 3   HTN (hypertension)   Tobacco use disorder   Atrial fibrillation with RVR (HCC)    1. Chest pain with history of CAD status post CABG -likely stress.  My A. fib with RVR which improved and resolved after rate improved and patient converted to sinus rhythm.  We will cycle cardiac markers.  Patient is on Xarelto Plavix statins and beta-blocker.  Cardiology notified. 2. Atrial fibrillation with RVR -converted spontaneously to sinus rhythm without any intervention.  On Lopressor and Xarelto.  Chads 2 vasc score is 6. 3. Diabetes mellitus type 2 with hyperglycemia -patient is on Lantus insulin along with sliding scale coverage.  Closely follow metabolic panel and CBGs. 4. Hypertension on Benicar HCTZ and metoprolol. 5. Chronic kidney disease stage III-IV -if creatinine worsens may have to hold Benicar HCT. 6. Tobacco abuse -tobacco cessation counseling requested.   DVT prophylaxis: Xarelto. Code Status: Full code. Family Communication: Wife at the bedside. Disposition Plan: Home. Consults called: Cardiology. Admission status: Observation.   Rise Patience MD [facility] Pager [phone].  If 7PM-7AM, please contact night-coverage [email or web address] Password [email or web address]  10/27/2017, 3:35 AM

## 2017-10-27 NOTE — Progress Notes (Signed)
ANTICOAGULATION CONSULT NOTE - Initial Consult  Pharmacy Consult for heparin Indication: atrial fibrillation  Allergies  Allergen Reactions  . Codeine Nausea And Vomiting  . Lisinopril Cough    Other reaction(s): Cough (ALLERGY/intolerance)  . Nsaids     Other reaction(s): Other (See Comments) CKD Other reaction(s): Other CKD  . Latex Hives    Patient Measurements: Height: 5\' 8"  (172.7 cm) Weight: 180 lb (81.6 kg) IBW/kg (Calculated) : 68.4 Heparin Dosing Weight: 81 kg  Vital Signs: BP: 120/69 (05/09 1515) Pulse Rate: 56 (05/09 1515)  Labs: Recent Labs    10/27/17 0038 10/27/17 0347 10/27/17 0935  HGB 14.7  --   --   HCT 43.9  --   --   PLT 260  --   --   CREATININE 2.12*  --   --   TROPONINI  --  0.19* 0.66*    Estimated Creatinine Clearance: 30.5 mL/min (A) (by C-G formula based on SCr of 2.12 mg/dL (H)).  Assessment: CC/HPI: 72 yo m presenting w afib - recent diagnosis 2 weeks ago  PMH: CAD s/p CABG, HTn, HLD, DM2, CVA, CKD III, PAD  Anticoag: rivaroxaban pta for new onset afib last admit, pt compliant (last dose 5/8 @ 1800)- now to iv hep  This patients CHA2DS2-VASc Score and unadjusted Ischemic Stroke Rate (% per year) is equal to 9.7 % stroke rate/year from a score of 6  Above score calculated as 1 point each if present [CHF, HTN, DM, Vascular=MI/PAD/Aortic Plaque, Age if 65-74, or Male] Above score calculated as 2 points each if present [Age > 75, or Stroke/TIA/TE]    CV: potential for ablation per EP   Renal: SCr 2.12  Heme/Onc: H&H 14.7/43.9, PLT 260  Goal of Therapy:  Heparin level 0.3-0.7 units/ml aPTT 66 - 102 seconds Monitor platelets by anticoagulation protocol: Yes   Plan:  DC xarelto  Begin hep gtt (no bolus d/t rivaroxaban pta) @ 1800 Initial dose 1200 units/hr Initial HL aPTT @ 0200 Daily HL aPTT CBC F/U return to rivaroxaban   Levester Fresh, PharmD, BCPS, BCCCP Clinical Pharmacist Clinical phone for 10/27/2017 from 1430  - 2300: [phone] If after 2300, please call main pharmacy at: [phone] 10/27/2017 3:34 PM

## 2017-10-27 NOTE — ED Triage Notes (Signed)
Pt from home. Complaints of chest pain 7/10 radiating to both jaws not relieved with nitro at home. Recently discharged states had cath that did not show any blockages per pt. Bypass sx in 2011.  Pt states "my heart is trying to give out"  No SHOB.  No N/V

## 2017-10-27 NOTE — ED Notes (Signed)
Patient Afib resolved before starting cardizem infusion.  HR 72 and in sinus rhythm.  Dr. Betsey Holiday notified.

## 2017-10-27 NOTE — Consult Note (Addendum)
Cardiology Consultation:   Patient ID: NIKOLOZ HUY; [id number]; 04-03-1946   Admit date: 10/27/2017 Date of Consult: 10/27/2017  Primary Care Provider: Aletha Halim., PA-C Primary Cardiologist: Mertie Moores, MD     Patient Profile:   JESSEN SIEGMAN is a 72 y.o. male with a hx of CAD (CABG), had a recent admission for new onset atrial flutter and NSTEMI with cath showing severe, native, 3 vessel CAD and 3/3 patent grafts but new OM1 and mid/distal LCx occlusions, treated medically, discharged in [place] after spontaneous conversion, HTN, DM, HLD, CKD (III), smoker, TIA, peripheral vascular disease b/l LE, who is being seen today for the evaluation of recurrent rapid Aflutter at the request of [name].  History of Present Illness:   Mr. Faux recent admission for new onset atrial flutter and NSTEMI with cath showing severe, native, 3 vessel CAD and 3/3 patent grafts but new OM1 and mid/distal LCx occlusions, treated medically, during that stay he was stared on [facility] for a/c, discharged in [place].   He had been feeling well until last night, was laying in bed, sudden onset of chest heaviness and racing heart, making him feel weak, "awful".  He states while in the ER, he stood to use the BR and could tell immediately that his HR went back to normal, immediately felt better.  He has stopped smoking since his last stay, denies any significant caffeine or ETOH, pending sleep apnea eval.  LABS K+ 4.5 BUN/Creat 28/2.12 (baseline looks about 1.8) Trop I: 0.19, 0.66 WBC 8.5 H/H 14/43 Plts 260 TSH 0.903  Home meds reviewed Only lopressor 12.5mg  BID as rate limiting drug   Past Medical History:  Diagnosis Date  . Back pain   . Basilar artery stenosis    on chronic Plavix  . Cerebral vascular disease    with prior TIA's; followed by Dr. Erling Cruz  . Depression   . Diabetes mellitus    on insulin  . History of kidney stones   . History of renal calculi   . Hyperlipidemia   . Hypertension   .  Ischemic heart disease    prior PCI to RCA in 1989. S/P PCI to LAD and OM in 1992. S/P PCI to first DX in 2000. S/P CABG x 3 in May 2011  . Peripheral neuropathy   . Pneumonia Sept 2012  . S/P CABG x 21 Oct 2009  . Sleep apnea     Past Surgical History:  Procedure Laterality Date  . ANGIOPLASTY  1989   right coronary artery  . ANGIOPLASTY  1992   LAD and OM  . ANGIOPLASTY  1998   First DX  . CORONARY ARTERY BYPASS GRAFT  11/12/2009   LIMA to LAD, SVG to OM and SVG to RCA  . CORONARY STENT PLACEMENT  2000   Stent to LAD/Circumflex with angioplasty to first diagonal   . LEFT HEART CATH AND CORS/GRAFTS ANGIOGRAPHY N/A 10/13/2017   Procedure: LEFT HEART CATH AND CORS/GRAFTS ANGIOGRAPHY;  Surgeon: Nelva Bush, MD;  Location: [facility];  Service: Cardiovascular;  Laterality: N/A;      Inpatient Medications: Scheduled Meds: . clopidogrel  75 mg Oral Daily  . ezetimibe  10 mg Oral Daily  . fenofibrate  160 mg Oral Daily  . hydrochlorothiazide  12.5 mg Oral Daily  . insulin aspart  0-9 Units Subcutaneous Q4H  . insulin glargine  35 Units Subcutaneous BID  . irbesartan  150 mg Oral Daily  . metoprolol tartrate  12.5  mg Oral BID  . omega-3 acid ethyl esters  2 g Oral Daily  . pravastatin  80 mg Oral QHS   Continuous Infusions: . heparin     PRN Meds: acetaminophen, fluticasone, loratadine, nitroGLYCERIN, ondansetron (ZOFRAN) IV  Allergies:    Allergies  Allergen Reactions  . Codeine Nausea And Vomiting  . Lisinopril Cough    Other reaction(s): Cough (ALLERGY/intolerance)  . Nsaids     Other reaction(s): Other (See Comments) CKD Other reaction(s): Other CKD  . Latex Hives    Social History:   Social History   Socioeconomic History  . Marital status: Divorced    Spouse name: Not on file  . Number of children: 0  . Years of education: GED  . Highest education level: Not on file  Occupational History  . Occupation: retired  Scientific laboratory technician  . Financial  resource strain: Not on file  . Food insecurity:    Worry: Not on file    Inability: Not on file  . Transportation needs:    Medical: Not on file    Non-medical: Not on file  Tobacco Use  . Smoking status: Former Smoker    Packs/day: 1.00    Years: 54.00    Pack years: 54.00    Types: Cigarettes    Last attempt to quit: 10/19/2009    Years since quitting: 8.0  . Smokeless tobacco: Never Used  Substance and Sexual Activity  . Alcohol use: No  . Drug use: No  . Sexual activity: Never  Lifestyle  . Physical activity:    Days per week: Not on file    Minutes per session: Not on file  . Stress: Not on file  Relationships  . Social connections:    Talks on phone: Not on file    Gets together: Not on file    Attends religious service: Not on file    Active member of club or organization: Not on file    Attends meetings of clubs or organizations: Not on file    Relationship status: Not on file  . Intimate partner violence:    Fear of current or ex partner: Not on file    Emotionally abused: Not on file    Physically abused: Not on file    Forced sexual activity: Not on file  Other Topics Concern  . Not on file  Social History Narrative   Patient is Divorced.   Patient has his GED.   Patient is right handed.   Patient does not drink any caffeine.    Family History:    Family History  Problem Relation Age of Onset  . Hypertension Father   . Heart disease Father   . Heart attack Father   . Asthma Brother   . Stroke Unknown        Uncle     ROS:  Please see the history of present illness.  All other ROS reviewed and negative.     Physical Exam/Data:   Vitals:   10/27/17 1415 10/27/17 1445 10/27/17 1515 10/27/17 1545  BP: 131/79 137/66 120/69 135/60  Pulse: (!) 56 (!) 53 (!) 56 (!) 57  Resp: 13 19 12  (!) 21  Temp:      TempSrc:      SpO2: 97% 97% 100% 95%  Weight:      Height:       No intake or output data in the 24 hours ending 10/27/17 1621 Filed  Weights   10/27/17 0036  Weight: 180  lb (81.6 kg)   Body mass index is 27.37 kg/m.  General:  Well nourished, well developed, in no acute distress HEENT: normal Lymph: no adenopathy Neck: no JVD Endocrine:  No thryomegaly Vascular: No carotid bruits Cardiac:   RRR; no murmurs, gallops or rubs Lungs:  CTA b/l, no wheezing, rhonchi or rales  Abd: soft, nontender  Ext: no edema Musculoskeletal:  No deformities Skin: warm and dry  Neuro:  no focal abnormalities noted Psych:  Normal affect   EKG:  The EKG was personally reviewed and demonstrates:   AFlutter, 150bpm 10/12/17 is SR, PR 179ms, QRS 23ms, QTc 460ms Telemetry:  Telemetry was personally reviewed and demonstrates:   AFlutter w/RVR 150-170's >> SR/SB 50's-60's  Relevant CV Studies:  10/13/17: [facility] Conclusions: 1. Severe 3-vessel coronary artery disease with chronic total occlusions of the proximal/mid LAD, mid/distal LCx, and proximal RCA. 2. Moderate caliber D1 and distal rPDA demonstrate 70-80% stenoses and are not revascularized. 3. Widely patent LIMA-LAD, SVG-OM2, and SVG-rPDA. 4. Moderately elevated left ventricular filling pressure (LVEDP 28 mmHg).  Recommendations: 1. Optimize medical therapy.  OM1 and mid/distal LCx occlusions are new since 2011 but involve relatively small territories.  Additionally, faint collaterals are present.  I suspect chest pain was due to demand ischemia in the setting of atrial flutter, though troponin elevation seems a bit excessive for supply-demand mismatch. 2. If Mr. Seago has recurrent chest pain despite aggressive medical therapy, PCI to D1 could be considered in the future.  Distal rPDA stenosis is too small/distal for percutaneous intervention. 3. No further IV fluids at this time.  Consider gentle diuresis beginning tomorrow as renal function allows, given moderately elevated LVEDP. 4. Restart heparin in 4 hours.  Given recent atrial flutter, Mr. Dunlevy would benefit from  anticoagulation in addition to antiplatelet therapy (favor NOAC + clopidogrel or ASA).  10/12/17: TTE Study Conclusions - Left ventricle: The cavity size was normal. Wall thickness was   increased in a pattern of mild LVH. Systolic function was normal.   The estimated ejection fraction was in the range of 55% to 60%.   Wall motion was normal; there were no regional wall motion   abnormalities. Left ventricular diastolic function parameters   were normal. Impressions: - Normal LV systolic function; mild diastolic dysfunction; mild   LVH.  Laboratory Data:  Chemistry Recent Labs  Lab 10/27/17 0038  NA 138  K 4.5  CL 105  CO2 25  GLUCOSE 346*  BUN 28*  CREATININE 2.12*  CALCIUM 9.7  GFRNONAA 29*  GFRAA 34*  ANIONGAP 8    No results for input(s): PROT, ALBUMIN, AST, ALT, ALKPHOS, BILITOT in the last 168 hours. Hematology Recent Labs  Lab 10/27/17 0038  WBC 8.5  RBC 4.84  HGB 14.7  HCT 43.9  MCV 90.7  MCH 30.4  MCHC 33.5  RDW 13.5  PLT 260   Cardiac Enzymes Recent Labs  Lab 10/27/17 0347 10/27/17 0935  TROPONINI 0.19* 0.66*    Recent Labs  Lab 10/27/17 0044  TROPIPOC 0.02    BNPNo results for input(s): BNP, PROBNP in the last 168 hours.  DDimer No results for input(s): DDIMER in the last 168 hours.  Radiology/Studies:   Dg Chest 2 View Result Date: 10/27/2017 CLINICAL DATA:  72 year old male with chest pain. EXAM: CHEST - 2 VIEW COMPARISON:  Chest radiograph dated 10/11/2017 FINDINGS: The lungs are clear. There is no pleural effusion or pneumothorax. The cardiac silhouette is within normal limits. Median sternotomy wires and CABG  changes noted. No acute osseous pathology. IMPRESSION: No active cardiopulmonary disease. Electronically Signed   By: Anner Crete M.D.   On: 10/27/2017 01:54    Assessment and Plan:   1. New onset of AFlutter, RVR (1st last month)     CHA2DS2Vasc is 6, on Xarleto     Calc CrCl is 36 this admission lab, approprietly on  15mg       I will review tracings with Dr. Lovena Le, uncertain if typical AFlutter for ablative strategies vs AAD.  Patient's rates on tele at time are likely 1:1 and very symptomatic.  Sinus rates have settle to the 50's.  Discussed importance of medicines, Xarelto, his risk of stroke/embolic events.    2. CP     Felt 2/2 RVR in the environment of CAD     No plans for cath this admission             For questions or updates, please contact [facility] Please consult [email or web address] for contact info under Cardiology/STEMI.   Signed, Baldwin Jamaica, PA-C  10/27/2017 4:21 PM  EP attending  Patient seen and examined.  Agree with the findings as noted above.  The patient is a very pleasant 72 year old man with a history of coronary artery disease, preserved left ventricular systolic function, who presented with a non-ST elevation MI in the setting of atrial fib/flutter with a rapid ventricular response.  I reviewed his EKGs.  He does not have typical atrial flutter.  The patient has converted spontaneously back to sinus rhythm.  His exam is as noted above.  The patient feels short of breath but did not have syncope in the setting of his atrial fibrillation and flutter.  Review of his labs demonstrates an elevated creatinine.  I discussed the treatment options with the patient.  He is quite symptomatic when he goes out of rhythm, and his rate is not well controlled.  Antiarrhythmic drug therapy is indicated.  Unfortunately his combination of coronary disease and chronic renal insufficiency makes all other antiarrhythmic drugs except for amiodarone contraindicated or at least relatively so.  I recommended starting amiodarone 200 mg twice a day.  He will be transitioned from 400 mg a day to a lower dose over the course of the next 4-5 or 6 weeks.  From my perspective, it would be acceptable to discharge the patient tomorrow morning after his second dose of amiodarone.  Cristopher Peru, MD

## 2017-10-27 NOTE — Progress Notes (Signed)
ANTICOAGULATION CONSULT NOTE - Initial Consult  Pharmacy Consult for Heparin (holding Xarelto) Indication: atrial fibrillation/flutter  Allergies  Allergen Reactions  . Codeine Nausea And Vomiting  . Lisinopril Cough    Other reaction(s): Cough (ALLERGY/intolerance)  . Nsaids     Other reaction(s): Other (See Comments) CKD Other reaction(s): Other CKD  . Latex Hives    Patient Measurements: Height: 5\' 8"  (172.7 cm) Weight: 180 lb (81.6 kg) IBW/kg (Calculated) : 68.4  Vital Signs: Temp: 99 F (37.2 C) (05/09 0036) Temp Source: Oral (05/09 0036) BP: 124/68 (05/09 0245) Pulse Rate: 72 (05/09 0245)  Labs: Recent Labs    10/27/17 0038  HGB 14.7  HCT 43.9  PLT 260  CREATININE 2.12*    Estimated Creatinine Clearance: 30.5 mL/min (A) (by C-G formula based on SCr of 2.12 mg/dL (H)).   Medical History: Past Medical History:  Diagnosis Date  . Back pain   . Basilar artery stenosis    on chronic Plavix  . Cerebral vascular disease    with prior TIA's; followed by Dr. Erling Cruz  . Depression   . Diabetes mellitus    on insulin  . History of kidney stones   . History of renal calculi   . Hyperlipidemia   . Hypertension   . Ischemic heart disease    prior PCI to RCA in 1989. S/P PCI to LAD and OM in 1992. S/P PCI to first DX in 2000. S/P CABG x 3 in May 2011  . Peripheral neuropathy   . Pneumonia Sept 2012  . S/P CABG x 21 Oct 2009  . Sleep apnea    Assessment: Xarelto PTA for afif/flutter, in the ED for the same, converted in the ED, holding Xarelto and starting heparin, will likely need to dose using the aPTT for several days due to Xarelto influence on anti-xa levels. CBC good. Noted renal dysfunction. Last dose Xarelto 5/8 at 1800, will start heparin 24 hours from this dose.   Goal of Therapy:  Heparin level 0.3-0.7 units/ml aPTT 66-102 seconds Monitor platelets by anticoagulation protocol: Yes   Plan:  -Start heparin drip at 1100 units/hr at 1800 today  (previously therapeutic rate) -Check heparin level and APTT at 0200 5/10 -Daily CBC/HL/aPTT -Monitor for bleeding -F/U cards plans  Usman, Millett 10/27/2017,2:51 AM

## 2017-10-27 NOTE — Progress Notes (Signed)
[facility]                                                                              Patient Demographics  Marvin Salinas, is a 72 y.o. male, DOB - [birth year], NWG:[id number]  Admit date - 10/27/2017   Admitting Physician No admitting provider for patient encounter.  Outpatient Primary MD for the patient is Marvin Halim., PA-C  Outpatient specialists:   LOS - 0  days   Medical records reviewed and are as summarized below:    Chief Complaint  Patient presents with  . Chest Pain       Brief summary   Patient is a 72 year old male with history of CAD status post CABG recently admitted for new onset atrial flutter and had NSTEMI, underwent cardiac cath was instructed medical management.  Patient return back with chest pain along with palpitations that started around 11 PM at home.  In ED, patient was found to be in atrial fibrillation with RVR, spontaneously converted and chest pain resolved. Troponin positive  Assessment & Plan    Principal Problem:   Chest pain with underlying history of CAD status post CABG, possibly worsened due to atrial fib with RVR -Troponin positive, obtain serial cardiac enzymes -Currently converted to normal sinus rhythm, heart rate in 60s, continue beta-blocker, Xarelto, Plavix.  Cardiology consulted.  Active Problems:   Atrial fibrillation with RVR (HCC) - Spontaneously converted to normal sinus rhythm, currently heart rate in 60s - Continue Lopressor - Continue Xarelto, [place] vasc 6   Essential HTN (hypertension) -Continue metoprolol, Benicar, HCTZ   Tobacco use disorder -Counseled on tobacco cessation  Diabetes mellitus type 2 uncontrolled with hyperglycemia -Continue Lantus, sliding scale insulin  Hyperlipidemia Continue Zetia-    Code Status: Full CODE STATUS DVT Prophylaxis: Xarelto Family Communication: Discussed in detail with the patient, all imaging results, lab results explained to the patient and  wife   Disposition Plan: Pending cardiology evaluation  Time Spent in minutes   25 minutes  Procedures:  None  Consultants:   Cardiology  Antimicrobials:      Medications  Scheduled Meds: . clopidogrel  75 mg Oral Daily  . ezetimibe  10 mg Oral Daily  . fenofibrate  160 mg Oral Daily  . hydrochlorothiazide  12.5 mg Oral Daily  . insulin aspart  0-9 Units Subcutaneous Q4H  . insulin glargine  35 Units Subcutaneous BID  . irbesartan  150 mg Oral Daily  . metoprolol tartrate  12.5 mg Oral BID  . omega-3 acid ethyl esters  2 g Oral Daily  . pravastatin  80 mg Oral QHS  . Rivaroxaban  15 mg Oral Q supper   Continuous Infusions: PRN Meds:.acetaminophen, fluticasone, loratadine, nitroGLYCERIN, ondansetron (ZOFRAN) IV   Antibiotics   Anti-infectives (From admission, onward)   None        Subjective:   Marvin Salinas was seen and examined today.  Feeling better now, no chest pain.  Spontaneously converted to normal sinus rhythm.  Heart rate in 60s. Patient denies dizziness, chest pain, shortness of breath, abdominal pain, N/V/D/C, new weakness, numbess, tingling.  Objective:   Vitals:  10/27/17 0815 10/27/17 0845 10/27/17 0915 10/27/17 0945  BP: (!) 124/58 125/66 122/66 112/64  Pulse: (!) 55 (!) 57 65 (!) 56  Resp: 19 18 20  (!) 21  Temp:      TempSrc:      SpO2: 96% 96% 95% 96%  Weight:      Height:       No intake or output data in the 24 hours ending 10/27/17 1216   Wt Readings from Last 3 Encounters:  10/27/17 81.6 kg (180 lb)  10/26/17 84.5 kg (186 lb 4 oz)  10/14/17 83.5 kg (184 lb)     Exam  General: Alert and oriented x 3, NAD  Eyes:   HEENT:  Atraumatic, normocephalic  Cardiovascular: S1 S2 auscultated, no rubs, murmurs or gallops. Regular rate and rhythm.  Respiratory: Clear to auscultation bilaterally, no wheezing, rales or rhonchi  Gastrointestinal: Soft, nontender, nondistended, + bowel sounds  Ext: no pedal edema  bilaterally  Neuro: AAOx3, Cr N's II- XII. Strength 5/5 upper and lower extremities bilaterally  Musculoskeletal: No digital cyanosis, clubbing  Skin: No rashes  Psych: Normal affect and demeanor, alert and oriented x3    Data Reviewed:  I have personally reviewed following labs and imaging studies  Micro Results No results found for this or any previous visit (from the past 240 hour(s)).  Radiology Reports Dg Chest 2 View  Result Date: 10/27/2017 CLINICAL DATA:  72 year old male with chest pain. EXAM: CHEST - 2 VIEW COMPARISON:  Chest radiograph dated 10/11/2017 FINDINGS: The lungs are clear. There is no pleural effusion or pneumothorax. The cardiac silhouette is within normal limits. Median sternotomy wires and CABG changes noted. No acute osseous pathology. IMPRESSION: No active cardiopulmonary disease. Electronically Signed   By: Anner Crete M.D.   On: 10/27/2017 01:54   Dg Chest 2 View  Result Date: 10/11/2017 CLINICAL DATA:  Central chest pressure and dyspnea since 6 p.m. EXAM: CHEST - 2 VIEW COMPARISON:  03/05/2011 FINDINGS: Stable cardiomegaly with aortic atherosclerosis. No aneurysm. Status post CABG. Chronic interstitial prominence without alveolar consolidation, effusion or pneumothorax. No acute osseous abnormality. IMPRESSION: 1. Chronic interstitial prominence without acute pulmonary disease. 2. Cardiomegaly with aortic atherosclerosis. Stable post CABG change. Electronically Signed   By: Ashley Royalty M.D.   On: 10/11/2017 21:34   US Renal  Result Date: 10/12/2017 CLINICAL DATA:  Acute kidney injury. Chronic kidney disease stage 3. EXAM: RENAL / URINARY TRACT ULTRASOUND COMPLETE COMPARISON:  CT AP 06/16/2014. FINDINGS: Right Kidney: Length: 11.6 cm. Mild perinephric fluid around the inferior pole noted. Echogenicity within normal limits. No mass or hydronephrosis visualized. Left Kidney: Length: 12.2 cm. Echogenicity within normal limits. No mass or hydronephrosis  visualized. Bladder: Appears normal for degree of bladder distention. Other:  Small volume of perihepatic ascites. IMPRESSION: 1. Normal cortical echogenicity of both kidneys without hydronephrosis. 2. Mild perinephric fluid surrounds the lower pole of the right kidney, which may be related to recent acute kidney injury. Electronically Signed   By: Kerby Moors M.D.   On: 10/12/2017 12:20    Lab Data:  CBC: Recent Labs  Lab 10/27/17 0038  WBC 8.5  HGB 14.7  HCT 43.9  MCV 90.7  PLT 397   Basic Metabolic Panel: Recent Labs  Lab 10/27/17 0038  NA 138  K 4.5  CL 105  CO2 25  GLUCOSE 346*  BUN 28*  CREATININE 2.12*  CALCIUM 9.7   GFR: Estimated Creatinine Clearance: 30.5 mL/min (A) (by C-G formula based on  SCr of 2.12 mg/dL (H)). Liver Function Tests: No results for input(s): AST, ALT, ALKPHOS, BILITOT, PROT, ALBUMIN in the last 168 hours. No results for input(s): LIPASE, AMYLASE in the last 168 hours. No results for input(s): AMMONIA in the last 168 hours. Coagulation Profile: No results for input(s): INR, PROTIME in the last 168 hours. Cardiac Enzymes: Recent Labs  Lab 10/27/17 0347  TROPONINI 0.19*   BNP (last 3 results) No results for input(s): PROBNP in the last 8760 hours. HbA1C: No results for input(s): HGBA1C in the last 72 hours. CBG: Recent Labs  Lab 10/27/17 0414 10/27/17 0629 10/27/17 0931  GLUCAP 234* 188* 155*   Lipid Profile: No results for input(s): CHOL, HDL, LDLCALC, TRIG, CHOLHDL, LDLDIRECT in the last 72 hours. Thyroid Function Tests: Recent Labs    10/27/17 0347  TSH 0.903   Anemia Panel: No results for input(s): VITAMINB12, FOLATE, FERRITIN, TIBC, IRON, RETICCTPCT in the last 72 hours. Urine analysis:    Component Value Date/Time   COLORURINE YELLOW 10/12/2017 0031   APPEARANCEUR HAZY (A) 10/12/2017 0031   LABSPEC 1.021 10/12/2017 0031   PHURINE 5.0 10/12/2017 0031   GLUCOSEU NEGATIVE 10/12/2017 0031   HGBUR NEGATIVE  10/12/2017 0031   BILIRUBINUR NEGATIVE 10/12/2017 0031   KETONESUR 5 (A) 10/12/2017 0031   PROTEINUR 30 (A) 10/12/2017 0031   UROBILINOGEN 0.2 06/16/2014 1211   NITRITE NEGATIVE 10/12/2017 0031   LEUKOCYTESUR NEGATIVE 10/12/2017 0031     Ripudeep Rai M.D. [facility] 10/27/2017, 12:16 PM  Pager: [phone] Between 7am to 7pm - call Pager - [phone]  After 7pm go to [email or web address] - password [email or web address]  Call night coverage person covering after 7pm

## 2017-10-27 NOTE — ED Provider Notes (Addendum)
[facility] EMERGENCY DEPARTMENT Provider Note   CSN: [id number] Arrival date & time: 10/27/17  0022     History   Chief Complaint Chief Complaint  Patient presents with  . Chest Pain    HPI Marvin Salinas is a 72 y.o. male.  Patient presents to the emergency department for evaluation of chest pain.  Patient reports that he had onset of pain in the center of his chest that radiated to both sides of his jaw earlier tonight.  He took nitroglycerin without any relief.  He reports that he recently had a heart attack.  Symptoms today seem similar.  Here in the ER he reports that the pain is significantly easing off on its own.  Oxygen and rest seem to have helped.  He did have shortness of breath initially but that has improved.     Past Medical History:  Diagnosis Date  . Back pain   . Basilar artery stenosis    on chronic Plavix  . Cerebral vascular disease    with prior TIA's; followed by Dr. Erling Cruz  . Depression   . Diabetes mellitus    on insulin  . History of kidney stones   . History of renal calculi   . Hyperlipidemia   . Hypertension   . Ischemic heart disease    prior PCI to RCA in 1989. S/P PCI to LAD and OM in 1992. S/P PCI to first DX in 2000. S/P CABG x 3 in May 2011  . Peripheral neuropathy   . Pneumonia Sept 2012  . S/P CABG x 21 Oct 2009  . Sleep apnea     Patient Active Problem List   Diagnosis Date Noted  . Nonsustained ventricular tachycardia ([place]) 10/13/2017  . Paroxysmal atrial flutter ([place]) 10/13/2017  . Acute kidney injury superimposed on chronic kidney disease ([place]) 10/12/2017  . NSTEMI (non-ST elevated myocardial infarction) ([place]) 10/12/2017  . Typical atrial flutter ([place])   . Chest pain 10/11/2017  . CAD (coronary artery disease) 09/08/2015  . Benign prostatic hypertrophy without urinary obstruction 04/17/2014  . Arteriosclerosis of coronary artery 04/17/2014  . Chronic kidney disease (CKD), stage III (moderate) ([place])  04/17/2014  . Diabetes mellitus, type 2 ([place]) 04/17/2014  . Diabetic neuropathy ([place]) 04/17/2014  . Essential (primary) hypertension 04/17/2014  . History of stroke 04/17/2014  . Cough 12/06/2013  . Tobacco use disorder 12/06/2013  . Allergic rhinitis 12/06/2013  . Depression, neurotic 11/21/2013  . Hyperlipidemia 03/15/2011  . CAD (coronary artery disease) of artery bypass graft 10/06/2010  . Diabetes mellitus 10/06/2010  . Cerebral vascular disease 10/06/2010  . S/P CABG x 3 10/06/2010  . HTN (hypertension) 10/06/2010  . Claudication ([place]) 10/06/2010  . Spinal stenosis 10/06/2010  . Enthesopathy of ankle and tarsus 09/04/2007    Past Surgical History:  Procedure Laterality Date  . ANGIOPLASTY  1989   right coronary artery  . ANGIOPLASTY  1992   LAD and OM  . ANGIOPLASTY  1998   First DX  . CORONARY ARTERY BYPASS GRAFT  11/12/2009   LIMA to LAD, SVG to OM and SVG to RCA  . CORONARY STENT PLACEMENT  2000   Stent to LAD/Circumflex with angioplasty to first diagonal   . LEFT HEART CATH AND CORS/GRAFTS ANGIOGRAPHY N/A 10/13/2017   Procedure: LEFT HEART CATH AND CORS/GRAFTS ANGIOGRAPHY;  Surgeon: Nelva Bush, MD;  Location: [facility];  Service: Cardiovascular;  Laterality: N/A;        Home Medications  Prior to Admission medications   Medication Sig Start Date End Date Taking? Authorizing Provider  Artificial Tear Ointment (DRY EYES OP) Place 1 drop into both eyes daily. Gen teal    [provider]  clopidogrel (PLAVIX) 75 MG tablet Take 1 tablet (75 mg total) by mouth daily. 11/01/16   [name], Wonda Cheng, MD  ezetimibe (ZETIA) 10 MG tablet Take 1 tablet (10 mg total) by mouth daily. 10/14/17   Rama, Venetia Maxon, MD  fluticasone (FLONASE) 50 MCG/ACT nasal spray Place 1 spray into both nostrils as needed.    [provider]  Hypertonic Nasal Wash (SINUS RINSE BOTTLE KIT NA) Place into the nose as needed.     [provider]  insulin  glargine (LANTUS) 100 UNIT/ML injection Inject 35 Units into the skin 2 (two) times daily.     [provider]  loratadine (CLARITIN) 10 MG tablet Take 10 mg by mouth daily as needed.     [provider]  metoprolol tartrate (LOPRESSOR) 25 MG tablet Take 0.5 tablets (12.5 mg total) by mouth 2 (two) times daily. 10/14/17   Rama, Venetia Maxon, MD  nitroGLYCERIN (NITROSTAT) 0.4 MG SL tablet Place 1 tablet (0.4 mg total) under the tongue every 5 (five) minutes as needed for chest pain. 10/14/17   Rama, Venetia Maxon, MD  olmesartan-hydrochlorothiazide (BENICAR HCT) 20-12.5 MG tablet TAKE 1 TABLET BY MOUTH EVERY DAY 11/24/16   [name], Wonda Cheng, MD  Omega-3 Fatty Acids (FISH OIL) 1000 MG CAPS Take 1,000 mg by mouth 2 (two) times daily.    [provider]  ondansetron (ZOFRAN ODT) 4 MG disintegrating tablet Take one tab by mouth Q6hr prn nausea.  Dissolve under tongue. 08/06/17   Kandra Nicolas, MD  pravastatin (PRAVACHOL) 80 MG tablet Take 80 mg by mouth at bedtime.     [provider]  Rivaroxaban (XARELTO) 15 MG TABS tablet Take 1 tablet (15 mg total) by mouth daily with supper. 10/14/17   Rama, Venetia Maxon, MD  TRICOR 145 MG tablet TAKE 1 TABLET BY MOUTH EVERY DAY 02/04/11   Burtis Junes, NP    Family History Family History  Problem Relation Age of Onset  . Hypertension Father   . Heart disease Father   . Heart attack Father   . Asthma Brother   . Stroke Unknown        Uncle    Social History Social History   Tobacco Use  . Smoking status: Former Smoker    Packs/day: 1.00    Years: 54.00    Pack years: 54.00    Types: Cigarettes    Last attempt to quit: 10/19/2009    Years since quitting: 8.0  . Smokeless tobacco: Never Used  Substance Use Topics  . Alcohol use: No  . Drug use: No     Allergies   Codeine; Lisinopril; Nsaids; and Latex   Review of Systems Review of Systems  Respiratory: Positive for shortness of breath.   Cardiovascular:  Positive for chest pain.  All other systems reviewed and are negative.    Physical Exam Updated Vital Signs BP 138/65 (BP Location: Right Arm)   Pulse 88   Temp 99 F (37.2 C) (Oral)   Resp 16   Ht _0  (1.727 m)   Wt 81.6 kg (180 lb)   SpO2 98%   BMI 27.37 kg/m   Physical Exam  Constitutional: He is oriented to person, place, and time. He appears well-developed and well-nourished. No  distress.  HENT:  Head: Normocephalic and atraumatic.  Right Ear: Hearing normal.  Left Ear: Hearing normal.  Nose: Nose normal.  Mouth/Throat: Oropharynx is clear and moist and mucous membranes are normal.  Eyes: Pupils are equal, round, and reactive to light. Conjunctivae and EOM are normal.  Neck: Normal range of motion. Neck supple.  Cardiovascular: S1 normal and S2 normal. An irregularly irregular rhythm present. Tachycardia present. Exam reveals no gallop and no friction rub.  No murmur heard. Pulmonary/Chest: Effort normal and breath sounds normal. No respiratory distress. He exhibits no tenderness.  Abdominal: Soft. Normal appearance and bowel sounds are normal. There is no hepatosplenomegaly. There is no tenderness. There is no rebound, no guarding, no tenderness at McBurney's point and negative Murphy's sign. No hernia.  Musculoskeletal: Normal range of motion.  Neurological: He is alert and oriented to person, place, and time. He has normal strength. No cranial nerve deficit or sensory deficit. Coordination normal. GCS eye subscore is 4. GCS verbal subscore is 5. GCS motor subscore is 6.  Skin: Skin is warm, dry and intact. No rash noted. No cyanosis.  Psychiatric: He has a normal mood and affect. His speech is normal and behavior is normal. Thought content normal.  Nursing note and vitals reviewed.    ED Treatments / Results  Labs (all labs ordered are listed, but only abnormal results are displayed) Labs Reviewed  BASIC METABOLIC PANEL - Abnormal; Notable for the following  components:      Result Value   Glucose, Bld 346 (*)    BUN 28 (*)    Creatinine, Ser 2.12 (*)    GFR calc non Af Amer 29 (*)    GFR calc Af Amer 34 (*)    All other components within normal limits  CBC  I-STAT TROPONIN, ED    EKG EKG Interpretation  Date/Time:  Thursday Oct 27 2017 00:33:24 EDT Ventricular Rate:  150 PR Interval:    QRS Duration: 84 QT Interval:  296 QTC Calculation: 467 R Axis:   -16 Text Interpretation:  Atrial fibrillation with rapid ventricular response Inferior infarct , age undetermined ST & T wave abnormality, consider lateral ischemia Abnormal ECG Confirmed by Orpah Greek [phone]) on 10/27/2017 1:29:12 AM   Radiology Dg Chest 2 View  Result Date: 10/27/2017 CLINICAL DATA:  72 year old male with chest pain. EXAM: CHEST - 2 VIEW COMPARISON:  Chest radiograph dated 10/11/2017 FINDINGS: The lungs are clear. There is no pleural effusion or pneumothorax. The cardiac silhouette is within normal limits. Median sternotomy wires and CABG changes noted. No acute osseous pathology. IMPRESSION: No active cardiopulmonary disease. Electronically Signed   By: Anner Crete M.D.   On: 10/27/2017 01:54    Procedures Procedures (including critical care time)  Medications Ordered in ED Medications  diltiazem (CARDIZEM) 1 mg/mL load via infusion 5 mg (has no administration in time range)    And  diltiazem (CARDIZEM) 100 mg in dextrose 5% 121m (1 mg/mL) infusion (has no administration in time range)     Initial Impression / Assessment and Plan / ED Course  I have reviewed the triage vital signs and the nursing notes.  Pertinent labs & imaging results that were available during my care of the patient were reviewed by me and considered in my medical decision making (see chart for details).     Patient presents to the ER for evaluation of chest pain.  He has a history of coronary artery disease, status post bypass in 2011.  Patient was hospitalized 3 weeks  ago for NSTEMI.  He presented in atrial fibrillation with rapid ventricular response.  Cardiac catheterization during the hospital stay revealed that he has chronic blockages but his bypasses are widely patent.  He did have some small vessels that were blocked that might have been the cause of the troponin elevations, but it was felt that it was more likely that it was demand ischemia secondary to rapid ventricular response.  Patient had Eliquis added to his drug regimen because his chads score is 6.  Patient noted to be in atrial fibrillation with rapid ventricular response again tonight.  He was in sinus rhythm yesterday in the cardiologist's office.  I am hesitant, however, to cardiovert him because he has not been on anticoagulation for very long.  Patient also borderline hypotensive.  Is discussed with on-call cardiology.  It was agreed that the patient would be risky for cardioversion tonight.  Recommendation was to initiate heparin in case there will be another cardiac catheterization or troponin elevation.  Initiate Cardizem cautiously.  If hypotension occurs, switch to amiodarone.  Request admission to hospitalist group.  CRITICAL CARE Performed by: Orpah Greek   Total critical care time: 30 minutes  Critical care time was exclusive of separately billable procedures and treating other patients.  Critical care was necessary to treat or prevent imminent or life-threatening deterioration.  Critical care was time spent personally by me on the following activities: development of treatment plan with patient and/or surrogate as well as nursing, discussions with consultants, evaluation of patient's response to treatment, examination of patient, obtaining history from patient or surrogate, ordering and performing treatments and interventions, ordering and review of laboratory studies, ordering and review of radiographic studies, pulse oximetry and re-evaluation of patient's  condition.  Addendum: Recheck reveals patient has spontaneously converted into sinus rhythm.  Cardizem therefore held.  Patient took his dose of Eliquis yesterday evening, therefore would not require heparinization unless troponin elevates.  Will still require hospitalization for serial troponins.  Final Clinical Impressions(s) / ED Diagnoses   Final diagnoses:  Atrial fibrillation with RVR ([place])  Chest pain, unspecified type    ED Discharge Orders    None       Orpah Greek, MD 10/27/17 8833    Orpah Greek, MD 10/27/17 [phone]

## 2017-10-27 NOTE — ED Notes (Signed)
Patient transported to 5C-19. No complaints at present. Family at bedside.

## 2017-10-27 NOTE — Discharge Instructions (Addendum)
You have an appointment set up with the [facility].  Multiple studies have shown that being followed by a dedicated atrial fibrillation clinic in addition to the standard care you receive from your other physicians improves health. We believe that enrollment in the atrial fibrillation clinic will allow us to better care for you.  ° °The phone number to the [facility]illation Clinic is [phone]. The clinic is staffed Monday through Friday from 8:30am to 5pm. ° °Parking Directions: The clinic is located in the Heart and Vascular Building connected to [facility]. °1)From [address] turn on to [address] and go to the 3rd entrance  (Heart and Vascular entrance) on the right. °2)Look to the right for Heart &Vascular Parking Garage. °3)A code for the entrance is required please call the clinic to receive this.   °4)Take the elevators to the 1st floor. Registration is in the room with the glass walls at the end of the hallway. ° °If you have any trouble parking or locating the clinic, please don’t hesitate to call [phone]. ° °Information on my medicine - XARELTO® (Rivaroxaban) ° °Why was Xarelto® prescribed for you? °Xarelto® was prescribed for you to reduce the risk of a blood clot forming that can cause a stroke if you have a medical condition called atrial fibrillation (a type of irregular heartbeat). ° °What do you need to know about xarelto® ? °Take your Xarelto® ONCE DAILY at the same time every day with your evening meal. °If you have difficulty swallowing the tablet whole, you may crush it and mix in applesauce just prior to taking your dose. ° °Take Xarelto® exactly as prescribed by your doctor and DO NOT stop taking Xarelto® without talking to the doctor who prescribed the medication.  Stopping without other stroke prevention medication to take the place of Xarelto® may increase your risk of developing a clot that causes a stroke.  Refill your prescription before you  run out. ° °After discharge, you should have regular check-up appointments with your healthcare provider that is prescribing your Xarelto®.  In the future your dose may need to be changed if your kidney function or weight changes by a significant amount. ° °What do you do if you miss a dose? °If you are taking Xarelto® ONCE DAILY and you miss a dose, take it as soon as you remember on the same day then continue your regularly scheduled once daily regimen the next day. Do not take two doses of Xarelto® at the same time or on the same day.  ° °Important Safety Information °A possible side effect of Xarelto® is bleeding. You should call your healthcare provider right away if you experience any of the following: °? Bleeding from an injury or your nose that does not stop. °? Unusual colored urine (red or dark brown) or unusual colored stools (red or black). °? Unusual bruising for unknown reasons. °? A serious fall or if you hit your head (even if there is no bleeding). ° °Some medicines may interact with Xarelto® and might increase your risk of bleeding while on Xarelto®. To help avoid this, consult your healthcare provider or pharmacist prior to using any new prescription or non-prescription medications, including herbals, vitamins, non-steroidal anti-inflammatory drugs (NSAIDs) and supplements. ° °This website has more information on Xarelto®: www.xarelto.com. ° °

## 2017-10-27 NOTE — ED Notes (Signed)
Attending MD paged re:  Troponin 0.66

## 2017-10-28 DIAGNOSIS — Z951 Presence of aortocoronary bypass graft: Secondary | ICD-10-CM | POA: Diagnosis not present

## 2017-10-28 DIAGNOSIS — N184 Chronic kidney disease, stage 4 (severe): Secondary | ICD-10-CM | POA: Diagnosis not present

## 2017-10-28 DIAGNOSIS — R079 Chest pain, unspecified: Secondary | ICD-10-CM | POA: Diagnosis not present

## 2017-10-28 DIAGNOSIS — I10 Essential (primary) hypertension: Secondary | ICD-10-CM | POA: Diagnosis not present

## 2017-10-28 DIAGNOSIS — I4891 Unspecified atrial fibrillation: Secondary | ICD-10-CM | POA: Diagnosis not present

## 2017-10-28 LAB — BASIC METABOLIC PANEL
Anion gap: 9 (ref 5–15)
BUN: 28 mg/dL — ABNORMAL HIGH (ref 6–20)
CO2: 27 mmol/L (ref 22–32)
Calcium: 9.8 mg/dL (ref 8.9–10.3)
Chloride: 105 mmol/L (ref 101–111)
Creatinine, Ser: 1.92 mg/dL — ABNORMAL HIGH (ref 0.61–1.24)
GFR calc Af Amer: 39 mL/min — ABNORMAL LOW (ref >60)
GFR calc non Af Amer: 33 mL/min — ABNORMAL LOW (ref >60)
Glucose, Bld: 97 mg/dL (ref 65–99)
Potassium: 4.2 mmol/L (ref 3.5–5.1)
Sodium: 141 mmol/L (ref 135–145)

## 2017-10-28 LAB — GLUCOSE, CAPILLARY
Glucose-Capillary: 117 mg/dL — ABNORMAL HIGH (ref 65–99)
Glucose-Capillary: 120 mg/dL — ABNORMAL HIGH (ref 65–99)
Glucose-Capillary: 170 mg/dL — ABNORMAL HIGH (ref 65–99)
Glucose-Capillary: 188 mg/dL — ABNORMAL HIGH (ref 65–99)
Glucose-Capillary: 210 mg/dL — ABNORMAL HIGH (ref 65–99)
Glucose-Capillary: 219 mg/dL — ABNORMAL HIGH (ref 65–99)
Glucose-Capillary: 97 mg/dL (ref 65–99)

## 2017-10-28 LAB — CBC
HCT: 45.4 % (ref 39.0–52.0)
Hemoglobin: 15.5 g/dL (ref 13.0–17.0)
MCH: 31.4 pg (ref 26.0–34.0)
MCHC: 34.1 g/dL (ref 30.0–36.0)
MCV: 91.9 fL (ref 78.0–100.0)
Platelets: 274 10*3/uL (ref 150–400)
RBC: 4.94 MIL/uL (ref 4.22–5.81)
RDW: 13.8 % (ref 11.5–15.5)
WBC: 8.1 10*3/uL (ref 4.0–10.5)

## 2017-10-28 LAB — MAGNESIUM: Magnesium: 1.8 mg/dL (ref 1.7–2.4)

## 2017-10-28 MED ORDER — MAGNESIUM SULFATE 2 GM/50ML IV SOLN
2.0000 g | Freq: Once | INTRAVENOUS | Status: AC
  Administered 2017-10-29: 2 g via INTRAVENOUS
  Filled 2017-10-28: qty 50

## 2017-10-28 MED ORDER — DOFETILIDE 125 MCG PO CAPS
125.0000 ug | ORAL_CAPSULE | Freq: Two times a day (BID) | ORAL | Status: DC
  Administered 2017-10-29 – 2017-10-31 (×5): 125 ug via ORAL
  Filled 2017-10-28 (×3): qty 1
  Filled 2017-10-28: qty 14
  Filled 2017-10-28 (×2): qty 1

## 2017-10-28 NOTE — Progress Notes (Signed)
[facility]                                                                              Patient Demographics  Marvin Salinas, is a 72 y.o. male, DOB - 12-15-1945, [id number]  Admit date - 10/27/2017   Admitting Physician Marvin Patience, MD  Outpatient Primary MD for the patient is Marvin Halim., PA-C  Outpatient specialists:   LOS - 0  days   Medical records reviewed and are as summarized below:    Chief Complaint  Patient presents with  . Chest Pain       Brief summary   Patient is a 72 year old male with history of CAD status post CABG recently admitted for new onset atrial flutter and had NSTEMI, underwent cardiac cath was instructed medical management.  Patient return back with chest pain along with palpitations that started around 11 PM at home.  In ED, patient was found to be in atrial fibrillation with RVR, spontaneously converted and chest pain resolved. Troponin positive  Assessment & Plan    Principal Problem:   Chest pain with underlying history of CAD status post CABG, possibly worsened due to atrial fib with RVR -Mildly elevated troponins with chest pain due to symptomatic A. fib -Converted to normal sinus rhythm.  Cardiology and EP cardiology was consulted  -Continue Xarelto, Plavix, beta-blocker currently converted to normal sinus rhythm, heart rate in 60s, continue beta-blocke  Active Problems:   Atrial fibrillation with RVR ([place]) -Converted to normal sinus rhythm however is symptomatic when he has RVR. - Continue Lopressor - Continue Xarelto, [place] vasc 6  -Patient was seen by EP cardiology and was recommended amiodarone Marvin Salinas, due to CAD and CKD, all other antiarrhythmic drugs contraindicated.  Patient however refused to take amiodarone stating previous bad experience with amiodarone, felt photosensitivity and "almost blind".  Essential HTN (hypertension) -Continue metoprolol, Benicar, HCTZ   Tobacco use  disorder -Counseled on tobacco cessation  Diabetes mellitus type 2 uncontrolled with hyperglycemia -1 reading elevated otherwise fairly controlled, continue Lantus, sliding scale insulin.    Hyperlipidemia Continue Zetia   Code Status: Full CODE STATUS DVT Prophylaxis: Xarelto Family Communication: Discussed in detail with the patient, all imaging results, lab results explained to the patient and wife   Disposition Plan: Awaiting further recommendations from EP cardiology  Time Spent in minutes   25 minutes  Procedures:  None  Consultants:   Cardiology  Antimicrobials:      Medications  Scheduled Meds: . clopidogrel  75 mg Oral Daily  . ezetimibe  10 mg Oral Daily  . fenofibrate  160 mg Oral Daily  . hydrochlorothiazide  12.5 mg Oral Daily  . insulin aspart  0-9 Units Subcutaneous Q4H  . insulin glargine  35 Units Subcutaneous BID  . irbesartan  150 mg Oral Daily  . metoprolol tartrate  12.5 mg Oral BID  . omega-3 acid ethyl esters  2 g Oral Daily  . pravastatin  80 mg Oral QHS  . rivaroxaban  15 mg Oral Q supper   Continuous Infusions: PRN Meds:.acetaminophen, fluticasone, loratadine, nitroGLYCERIN, ondansetron (ZOFRAN) IV   Antibiotics  Anti-infectives (From admission, onward)   None        Subjective:   Marvin Salinas was seen and examined today.  Refuses to take amiodarone, currently no chest pain or palpitations.  Spontaneously had converted to normal sinus rhythm.  Patient denies dizziness, chest pain, shortness of breath, abdominal pain, N/V/D/C, new weakness, numbess, tingling.  Objective:   Vitals:   10/27/17 2355 10/28/17 0357 10/28/17 0943 10/28/17 1158  BP: (!) 111/49 (!) 148/74 (!) 154/69 (!) 141/68  Pulse: (!) 55 (!) 54 61 (!) 51  Resp: 20 20  20   Temp: 98 F (36.7 C)   97.8 F (36.6 C)  TempSrc: Oral   Oral  SpO2: 95% 97%  96%  Weight:  82 kg (180 lb 12.8 oz)    Height:        Intake/Output Summary (Last 24 hours) at  10/28/2017 1243 Last data filed at 10/28/2017 0933 Gross per 24 hour  Intake 480 ml  Output -  Net 480 ml     Wt Readings from Last 3 Encounters:  10/28/17 82 kg (180 lb 12.8 oz)  10/26/17 84.5 kg (186 lb 4 oz)  10/14/17 83.5 kg (184 lb)     Exam   General: Alert and oriented x 3, NAD  Eyes:   HEENT:    Cardiovascular: S1 S2 auscultated, Regular rate and rhythm. No pedal edema b/l  Respiratory: Clear to auscultation bilaterally, no wheezing, rales or rhonchi  Gastrointestinal: Soft, nontender, nondistended, + bowel sounds  Ext: no pedal edema bilaterally  Neuro: no new deficit  Musculoskeletal: No digital cyanosis, clubbing  Skin: No rashes  Psych: Normal affect and demeanor, alert and oriented x3    Data Reviewed:  I have personally reviewed following labs and imaging studies  Micro Results No results found for this or any previous visit (from the past 240 hour(s)).  Radiology Reports Dg Chest 2 View  Result Date: 10/27/2017 CLINICAL DATA:  72 year old male with chest pain. EXAM: CHEST - 2 VIEW COMPARISON:  Chest radiograph dated 10/11/2017 FINDINGS: The lungs are clear. There is no pleural effusion or pneumothorax. The cardiac silhouette is within normal limits. Median sternotomy wires and CABG changes noted. No acute osseous pathology. IMPRESSION: No active cardiopulmonary disease. Electronically Signed   By: Anner Crete M.D.   On: 10/27/2017 01:54   Dg Chest 2 View  Result Date: 10/11/2017 CLINICAL DATA:  Central chest pressure and dyspnea since 6 p.m. EXAM: CHEST - 2 VIEW COMPARISON:  03/05/2011 FINDINGS: Stable cardiomegaly with aortic atherosclerosis. No aneurysm. Status post CABG. Chronic interstitial prominence without alveolar consolidation, effusion or pneumothorax. No acute osseous abnormality. IMPRESSION: 1. Chronic interstitial prominence without acute pulmonary disease. 2. Cardiomegaly with aortic atherosclerosis. Stable post CABG change.  Electronically Signed   By: Ashley Royalty M.D.   On: 10/11/2017 21:34   US Renal  Result Date: 10/12/2017 CLINICAL DATA:  Acute kidney injury. Chronic kidney disease stage 3. EXAM: RENAL / URINARY TRACT ULTRASOUND COMPLETE COMPARISON:  CT AP 06/16/2014. FINDINGS: Right Kidney: Length: 11.6 cm. Mild perinephric fluid around the inferior pole noted. Echogenicity within normal limits. No mass or hydronephrosis visualized. Left Kidney: Length: 12.2 cm. Echogenicity within normal limits. No mass or hydronephrosis visualized. Bladder: Appears normal for degree of bladder distention. Other:  Small volume of perihepatic ascites. IMPRESSION: 1. Normal cortical echogenicity of both kidneys without hydronephrosis. 2. Mild perinephric fluid surrounds the lower pole of the right kidney, which may be related to recent acute kidney injury.  Electronically Signed   By: Kerby Moors M.D.   On: 10/12/2017 12:20    Lab Data:  CBC: Recent Labs  Lab 10/27/17 0038 10/28/17 0622  WBC 8.5 8.1  HGB 14.7 15.5  HCT 43.9 45.4  MCV 90.7 91.9  PLT 260 562   Basic Metabolic Panel: Recent Labs  Lab 10/27/17 0038 10/28/17 0622  NA 138 141  K 4.5 4.2  CL 105 105  CO2 25 27  GLUCOSE 346* 97  BUN 28* 28*  CREATININE 2.12* 1.92*  CALCIUM 9.7 9.8   GFR: Estimated Creatinine Clearance: 33.6 mL/min (A) (by C-G formula based on SCr of 1.92 mg/dL (H)). Liver Function Tests: No results for input(s): AST, ALT, ALKPHOS, BILITOT, PROT, ALBUMIN in the last 168 hours. No results for input(s): LIPASE, AMYLASE in the last 168 hours. No results for input(s): AMMONIA in the last 168 hours. Coagulation Profile: No results for input(s): INR, PROTIME in the last 168 hours. Cardiac Enzymes: Recent Labs  Lab 10/27/17 0347 10/27/17 0935 10/27/17 1703  TROPONINI 0.19* 0.66* 0.49*   BNP (last 3 results) No results for input(s): PROBNP in the last 8760 hours. HbA1C: No results for input(s): HGBA1C in the last 72  hours. CBG: Recent Labs  Lab 10/27/17 2013 10/27/17 2352 10/28/17 0351 10/28/17 0722 10/28/17 1135  GLUCAP 188* 120* 97 117* 210*   Lipid Profile: No results for input(s): CHOL, HDL, LDLCALC, TRIG, CHOLHDL, LDLDIRECT in the last 72 hours. Thyroid Function Tests: Recent Labs    10/27/17 0347  TSH 0.903   Anemia Panel: No results for input(s): VITAMINB12, FOLATE, FERRITIN, TIBC, IRON, RETICCTPCT in the last 72 hours. Urine analysis:    Component Value Date/Time   COLORURINE YELLOW 10/12/2017 0031   APPEARANCEUR HAZY (A) 10/12/2017 0031   LABSPEC 1.021 10/12/2017 0031   PHURINE 5.0 10/12/2017 0031   GLUCOSEU NEGATIVE 10/12/2017 0031   HGBUR NEGATIVE 10/12/2017 0031   BILIRUBINUR NEGATIVE 10/12/2017 0031   KETONESUR 5 (A) 10/12/2017 0031   PROTEINUR 30 (A) 10/12/2017 0031   UROBILINOGEN 0.2 06/16/2014 1211   NITRITE NEGATIVE 10/12/2017 0031   LEUKOCYTESUR NEGATIVE 10/12/2017 0031     Ripudeep Rai M.D. [facility] 10/28/2017, 12:43 PM  Pager: [phone] Between 7am to 7pm - call Pager - [phone]  After 7pm go to [email or web address] - password [email or web address]  Call night coverage person covering after 7pm

## 2017-10-28 NOTE — Progress Notes (Addendum)
Notified by night shift RN and Dr. Tana Coast that patient has been refusing amiodarone. Cardiology and EP text paged per Dr. Josem Kaufmann request. Asked pt why he doesn't want to take med, he states "it makes my eyes sensitive to the light and it may turn my skin blue". Patient states he saw on television years ago that someone "turned blue" from taking "amiodarone".

## 2017-10-28 NOTE — Progress Notes (Signed)
Progress Note  Patient Name: Marvin Salinas Date of Encounter: 10/28/2017  Primary Cardiologist: Mertie Moores, MD   Subjective   72 year old gentleman with a history of coronary artery disease-status post coronary artery bypass grafting.  He had a recent admission to the hospital last month with a non-ST segment elevation myocardial infarction and new onset atrial flutter.  Heart catheterization at that time revealed severe native coronary artery disease.  He has a new obtuse marginal artery stenosis that is been treated medically.  He has a tight stenosis in the prox D1.    This vessel is very small and the lesion is not all that favorable for PCI   The patient was readmitted yesterday with recurrent chest pain and minimally elevated troponin levels.  It seems that his episodes of chest discomfort are related to rapid atrial flutter.  He was seen in consultation by Dr. Lovena Le.  Because of his coronary artery disease and renal insufficiency, amiodarone was recommended.  The patient refuses to take amiodarone.  He is tried amiodarone in the past and it caused some visual disturbances.  He is also concerned about the discoloration of his skin that he is read about/heard about.   Inpatient Medications    Scheduled Meds: . amiodarone  200 mg Oral BID  . clopidogrel  75 mg Oral Daily  . ezetimibe  10 mg Oral Daily  . fenofibrate  160 mg Oral Daily  . hydrochlorothiazide  12.5 mg Oral Daily  . insulin aspart  0-9 Units Subcutaneous Q4H  . insulin glargine  35 Units Subcutaneous BID  . irbesartan  150 mg Oral Daily  . metoprolol tartrate  12.5 mg Oral BID  . omega-3 acid ethyl esters  2 g Oral Daily  . pravastatin  80 mg Oral QHS  . rivaroxaban  15 mg Oral Q supper   Continuous Infusions:  PRN Meds: acetaminophen, fluticasone, loratadine, nitroGLYCERIN, ondansetron (ZOFRAN) IV   Vital Signs    Vitals:   10/27/17 1626 10/27/17 2029 10/27/17 2355 10/28/17 0357  BP: (!) 141/69  122/66 (!) 111/49 (!) 148/74  Pulse: (!) 55 (!) 57 (!) 55 (!) 54  Resp: 20  20 20   Temp: 98 F (36.7 C) 98.1 F (36.7 C) 98 F (36.7 C)   TempSrc: Oral Oral Oral   SpO2: 97% 95% 95% 97%  Weight: 182 lb (82.6 kg)   180 lb 12.8 oz (82 kg)  Height: 5\' 8"  (1.727 m)       Intake/Output Summary (Last 24 hours) at 10/28/2017 0904 Last data filed at 10/28/2017 0650 Gross per 24 hour  Intake 240 ml  Output -  Net 240 ml   Filed Weights   10/27/17 0036 10/27/17 1626 10/28/17 0357  Weight: 180 lb (81.6 kg) 182 lb (82.6 kg) 180 lb 12.8 oz (82 kg)    Telemetry    NSR  - Personally Reviewed  ECG     NSR  - Personally Reviewed  Physical Exam   GEN:  Elderly gentleman, no acute distress. Neck: No JVD Cardiac: RRR, no murmurs, rubs, or gallops.  Respiratory: Clear to auscultation bilaterally. GI: Soft, nontender, non-distended  MS: No edema; No deformity. Neuro:  Nonfocal  Psych: Normal affect   Labs    Chemistry Recent Labs  Lab 10/27/17 0038 10/28/17 0622  NA 138 141  K 4.5 4.2  CL 105 105  CO2 25 27  GLUCOSE 346* 97  BUN 28* 28*  CREATININE 2.12* 1.92*  CALCIUM 9.7 9.8  GFRNONAA 29* 33*  GFRAA 34* 39*  ANIONGAP 8 9     Hematology Recent Labs  Lab 10/27/17 0038 10/28/17 0622  WBC 8.5 8.1  RBC 4.84 4.94  HGB 14.7 15.5  HCT 43.9 45.4  MCV 90.7 91.9  MCH 30.4 31.4  MCHC 33.5 34.1  RDW 13.5 13.8  PLT 260 274    Cardiac Enzymes Recent Labs  Lab 10/27/17 0347 10/27/17 0935 10/27/17 1703  TROPONINI 0.19* 0.66* 0.49*    Recent Labs  Lab 10/27/17 0044  TROPIPOC 0.02     BNPNo results for input(s): BNP, PROBNP in the last 168 hours.   DDimer No results for input(s): DDIMER in the last 168 hours.   Radiology    Dg Chest 2 View  Result Date: 10/27/2017 CLINICAL DATA:  72 year old male with chest pain. EXAM: CHEST - 2 VIEW COMPARISON:  Chest radiograph dated 10/11/2017 FINDINGS: The lungs are clear. There is no pleural effusion or pneumothorax.  The cardiac silhouette is within normal limits. Median sternotomy wires and CABG changes noted. No acute osseous pathology. IMPRESSION: No active cardiopulmonary disease. Electronically Signed   By: Anner Crete M.D.   On: 10/27/2017 01:54    Cardiac Studies     Patient Profile     72 y.o. male   Assessment & Plan    1.  Coronary artery disease: Patient is currently pain-free.  He did have some chest pain and very minimal troponin elevations while in rapid atrial flutter.  I have personally reviewed the angiograms. I think that controlling his tachycardia is our best option.  He is aggressive lipid-lowering therapy.  2.  Hyperlipidemia: His last LDL was 111 .  He is on Pravachol 80 mg a day , zetia 10 mg a day, fenofibrate 160 mg a day  He may need to be considered for PCSK-9 inhibitor therapy  3.  Atrial flutter: Patient has episodes of rapid atrial flutter. He was seen by electrophysiology yesterday.  He refuses to take amiodarone because of some photosensitivity that he had with it in the past.  He also has heard that it can discolored the skin.  We will ask EP if there are any other options.     For questions or updates, please contact [place] Please consult [email or web address] for contact info under Cardiology/STEMI.      Signed, Mertie Moores, MD  10/28/2017, 9:04 AM

## 2017-10-28 NOTE — Progress Notes (Addendum)
Progress Note  Patient Name: Marvin Salinas Date of Encounter: 10/28/2017  Primary Cardiologist: Marvin Moores, MD   Subjective   Feels well, no CP, palpitations or SOB, can tell his heart rhythm has been good  Inpatient Medications    Scheduled Meds: . amiodarone  200 mg Oral BID  . clopidogrel  75 mg Oral Daily  . ezetimibe  10 mg Oral Daily  . fenofibrate  160 mg Oral Daily  . hydrochlorothiazide  12.5 mg Oral Daily  . insulin aspart  0-9 Units Subcutaneous Q4H  . insulin glargine  35 Units Subcutaneous BID  . irbesartan  150 mg Oral Daily  . metoprolol tartrate  12.5 mg Oral BID  . omega-3 acid ethyl esters  2 g Oral Daily  . pravastatin  80 mg Oral QHS  . rivaroxaban  15 mg Oral Q supper   Continuous Infusions:  PRN Meds: acetaminophen, fluticasone, loratadine, nitroGLYCERIN, ondansetron (ZOFRAN) IV   Vital Signs    Vitals:   10/27/17 2029 10/27/17 2355 10/28/17 0357 10/28/17 0943  BP: 122/66 (!) 111/49 (!) 148/74 (!) 154/69  Pulse: (!) 57 (!) 55 (!) 54 61  Resp:  20 20   Temp: 98.1 F (36.7 C) 98 F (36.7 C)    TempSrc: Oral Oral    SpO2: 95% 95% 97%   Weight:   180 lb 12.8 oz (82 kg)   Height:        Intake/Output Summary (Last 24 hours) at 10/28/2017 0955 Last data filed at 10/28/2017 0933 Gross per 24 hour  Intake 480 ml  Output -  Net 480 ml   Filed Weights   10/27/17 0036 10/27/17 1626 10/28/17 0357  Weight: 180 lb (81.6 kg) 182 lb (82.6 kg) 180 lb 12.8 oz (82 kg)    Telemetry    SB 50's - Personally Reviewed  ECG    No new KGs - Personally Reviewed  Physical Exam   GEN: No acute distress.   Neck: No JVD Cardiac: RRR, no murmurs, rubs, or gallops.  Respiratory: CTA b/l. GI: Soft, nontender, non-distended  MS: No edema; No deformity. Neuro:  Nonfocal  Psych: Normal affect   Labs    Chemistry Recent Labs  Lab 10/27/17 0038 10/28/17 0622  NA 138 141  K 4.5 4.2  CL 105 105  CO2 25 27  GLUCOSE 346* 97  BUN 28* 28*    CREATININE 2.12* 1.92*  CALCIUM 9.7 9.8  GFRNONAA 29* 33*  GFRAA 34* 39*  ANIONGAP 8 9     Hematology Recent Labs  Lab 10/27/17 0038 10/28/17 0622  WBC 8.5 8.1  RBC 4.84 4.94  HGB 14.7 15.5  HCT 43.9 45.4  MCV 90.7 91.9  MCH 30.4 31.4  MCHC 33.5 34.1  RDW 13.5 13.8  PLT 260 274    Cardiac Enzymes Recent Labs  Lab 10/27/17 0347 10/27/17 0935 10/27/17 1703  TROPONINI 0.19* 0.66* 0.49*    Recent Labs  Lab 10/27/17 0044  TROPIPOC 0.02     BNPNo results for input(s): BNP, PROBNP in the last 168 hours.   DDimer No results for input(s): DDIMER in the last 168 hours.   Radiology    Dg Chest 2 View Result Date: 10/27/2017 CLINICAL DATA:  72 year old male with chest pain. EXAM: CHEST - 2 VIEW COMPARISON:  Chest radiograph dated 10/11/2017 FINDINGS: The lungs are clear. There is no pleural effusion or pneumothorax. The cardiac silhouette is within normal limits. Median sternotomy wires and CABG changes noted. No  acute osseous pathology. IMPRESSION: No active cardiopulmonary disease. Electronically Signed   By: Marvin Salinas M.D.   On: 10/27/2017 01:54    Cardiac Studies   10/13/17: [facility] Conclusions: 1. Severe 3-vessel coronary artery disease with chronic total occlusions of the proximal/mid LAD, mid/distal LCx, and proximal RCA. 2. Moderate caliber D1 and distal rPDA demonstrate 70-80% stenoses and are not revascularized. 3. Widely patent LIMA-LAD, SVG-OM2, and SVG-rPDA. 4. Moderately elevated left ventricular filling pressure (LVEDP 28 mmHg).  Recommendations: 1. Optimize medical therapy. OM1 and mid/distal LCx occlusions are new since 2011 but involve relatively small territories. Additionally, faint collaterals are present. I suspect chest pain was due to demand ischemia in the setting of atrial flutter, though troponin elevation seems a bit excessive for supply-demand mismatch. 2. If Marvin Salinas has recurrent chest pain despite aggressive medical therapy, PCI  to D1 could be considered in the future. Distal rPDA stenosis is too small/distal for percutaneous intervention. 3. No further IV fluids at this time. Consider gentle diuresis beginning tomorrow as renal function allows, given moderately elevated LVEDP. 4. Restart heparin in 4 hours. Given recent atrial flutter, Marvin Salinas benefit from anticoagulation in addition to antiplatelet therapy (favor NOAC + clopidogrel or ASA).  10/12/17: TTE Study Conclusions - Left ventricle: The cavity size was normal. Wall thickness was increased in a pattern of mild LVH. Systolic function was normal. The estimated ejection fraction was in the range of 55% to 60%. Wall motion was normal; there were no regional wall motion abnormalities. Left ventricular diastolic function parameters were normal. Impressions: - Normal LV systolic function; mild diastolic dysfunction; mild LVH.   Patient Profile     72 y.o. male with a hx of CAD (CABG), had a recent admission for new onset atrial flutter and NSTEMI with cath showing severe, native, 3 vessel CAD and 3/3 patent grafts but new OM1 and mid/distal LCx occlusions, treated medically, discharged in [place] after spontaneous conversion, HTN, DM, HLD, CKD (III), smoker, TIA, peripheral vascular disease b/l LE, admitted with sudden onset of chest heaviness and racing heart, making him feel weak, "awful".  He stated while in the ER, he stood to use the BR and could tell immediately that his HR went back to normal.  He had a recent admission for new onset rapid atrial flutter and NSTEMI with cath showing severe, native, 3 vessel CAD and 3/3 patent grafts but new OM1 and mid/distal LCx occlusions, treated medically, during that stay he was stared on [facility] for a/c, discharged in [place].  Assessment & Plan    1. New onset of AFlutter, RVR (1st last month), rates 150's-170's, very symptomatic     CHA2DS2Vasc is 6, on Xarleto     Calc CrCl is 36- 43 by his last few  Creat, approprietly on 15mg       AFlutter is atypical, his AAD options are quite limited given his structural heart disease and CKD.  The patient recalled last night after his conversation with Dr. Lovena Le, that after his CABG he was on amiodarone and could not tolerate it 2/2 skin sensitivity when outside felt like his skin was burning, and felt like his eyes became extremely sensitive to the sun as well, "Damn near blinded me".  I will review the case with Dr. Caryl Comes, he will see the patient later this morning/today.   For questions or updates, please contact [place] Please consult [email or web address] for contact info under Cardiology/STEMI.      Signed, Baldwin Jamaica, PA-C  10/28/2017, 9:55 AM    Coronary artery disease  Normal LV function  Renal insufficiency grade 4  Atrial flutter-atypical   As outlined in Dr. Tanna Furry notes from yesterday, and the note from today, antiarrhythmic options are limited.  Amiodarone has been intolerable in the past secondary to sun sensitivity.  While his renal function is poor it does not preclude dofetilide although low doses will be necessary.  With his normal LV function and the atrial arrhythmia being the dominant short-term issue I Salinas be inclined to hold his ARB for now.  It may allow for higher dose of dofetilide.  We have reviewed proarrhythmic risks  In addition, ranolazine can be used at adjunctively with his dofetilide.  I Salinas consider referral also for catheter ablation as an outpatient

## 2017-10-29 DIAGNOSIS — R9431 Abnormal electrocardiogram [ECG] [EKG]: Secondary | ICD-10-CM | POA: Diagnosis present

## 2017-10-29 DIAGNOSIS — N179 Acute kidney failure, unspecified: Secondary | ICD-10-CM | POA: Diagnosis present

## 2017-10-29 DIAGNOSIS — Z8679 Personal history of other diseases of the circulatory system: Secondary | ICD-10-CM | POA: Diagnosis not present

## 2017-10-29 DIAGNOSIS — R7989 Other specified abnormal findings of blood chemistry: Secondary | ICD-10-CM | POA: Diagnosis present

## 2017-10-29 DIAGNOSIS — Z7901 Long term (current) use of anticoagulants: Secondary | ICD-10-CM | POA: Diagnosis not present

## 2017-10-29 DIAGNOSIS — I25709 Atherosclerosis of coronary artery bypass graft(s), unspecified, with unspecified angina pectoris: Secondary | ICD-10-CM | POA: Diagnosis present

## 2017-10-29 DIAGNOSIS — I129 Hypertensive chronic kidney disease with stage 1 through stage 4 chronic kidney disease, or unspecified chronic kidney disease: Secondary | ICD-10-CM | POA: Diagnosis present

## 2017-10-29 DIAGNOSIS — R402413 Glasgow coma scale score 13-15, at hospital admission: Secondary | ICD-10-CM | POA: Diagnosis present

## 2017-10-29 DIAGNOSIS — I248 Other forms of acute ischemic heart disease: Secondary | ICD-10-CM | POA: Diagnosis present

## 2017-10-29 DIAGNOSIS — G629 Polyneuropathy, unspecified: Secondary | ICD-10-CM | POA: Diagnosis present

## 2017-10-29 DIAGNOSIS — R079 Chest pain, unspecified: Secondary | ICD-10-CM | POA: Diagnosis not present

## 2017-10-29 DIAGNOSIS — I679 Cerebrovascular disease, unspecified: Secondary | ICD-10-CM | POA: Diagnosis present

## 2017-10-29 DIAGNOSIS — I10 Essential (primary) hypertension: Secondary | ICD-10-CM | POA: Diagnosis not present

## 2017-10-29 DIAGNOSIS — I771 Stricture of artery: Secondary | ICD-10-CM | POA: Diagnosis present

## 2017-10-29 DIAGNOSIS — G4730 Sleep apnea, unspecified: Secondary | ICD-10-CM | POA: Diagnosis present

## 2017-10-29 DIAGNOSIS — N184 Chronic kidney disease, stage 4 (severe): Secondary | ICD-10-CM | POA: Diagnosis present

## 2017-10-29 DIAGNOSIS — Z8673 Personal history of transient ischemic attack (TIA), and cerebral infarction without residual deficits: Secondary | ICD-10-CM | POA: Diagnosis not present

## 2017-10-29 DIAGNOSIS — E1122 Type 2 diabetes mellitus with diabetic chronic kidney disease: Secondary | ICD-10-CM | POA: Diagnosis present

## 2017-10-29 DIAGNOSIS — Z7902 Long term (current) use of antithrombotics/antiplatelets: Secondary | ICD-10-CM | POA: Diagnosis not present

## 2017-10-29 DIAGNOSIS — E785 Hyperlipidemia, unspecified: Secondary | ICD-10-CM | POA: Diagnosis present

## 2017-10-29 DIAGNOSIS — I484 Atypical atrial flutter: Secondary | ICD-10-CM | POA: Diagnosis present

## 2017-10-29 DIAGNOSIS — E1165 Type 2 diabetes mellitus with hyperglycemia: Secondary | ICD-10-CM | POA: Diagnosis present

## 2017-10-29 DIAGNOSIS — E1151 Type 2 diabetes mellitus with diabetic peripheral angiopathy without gangrene: Secondary | ICD-10-CM | POA: Diagnosis present

## 2017-10-29 DIAGNOSIS — I4891 Unspecified atrial fibrillation: Secondary | ICD-10-CM | POA: Diagnosis present

## 2017-10-29 DIAGNOSIS — Z87891 Personal history of nicotine dependence: Secondary | ICD-10-CM | POA: Diagnosis not present

## 2017-10-29 DIAGNOSIS — I252 Old myocardial infarction: Secondary | ICD-10-CM | POA: Diagnosis not present

## 2017-10-29 LAB — BASIC METABOLIC PANEL
Anion gap: 8 (ref 5–15)
BUN: 30 mg/dL — ABNORMAL HIGH (ref 6–20)
CO2: 25 mmol/L (ref 22–32)
Calcium: 9.5 mg/dL (ref 8.9–10.3)
Chloride: 106 mmol/L (ref 101–111)
Creatinine, Ser: 1.98 mg/dL — ABNORMAL HIGH (ref 0.61–1.24)
GFR calc Af Amer: 37 mL/min — ABNORMAL LOW (ref >60)
GFR calc non Af Amer: 32 mL/min — ABNORMAL LOW (ref >60)
Glucose, Bld: 123 mg/dL — ABNORMAL HIGH (ref 65–99)
Potassium: 4 mmol/L (ref 3.5–5.1)
Sodium: 139 mmol/L (ref 135–145)

## 2017-10-29 LAB — CBC
HCT: 41.1 % (ref 39.0–52.0)
Hemoglobin: 14.1 g/dL (ref 13.0–17.0)
MCH: 30.5 pg (ref 26.0–34.0)
MCHC: 34.3 g/dL (ref 30.0–36.0)
MCV: 89 fL (ref 78.0–100.0)
Platelets: 245 10*3/uL (ref 150–400)
RBC: 4.62 MIL/uL (ref 4.22–5.81)
RDW: 13.3 % (ref 11.5–15.5)
WBC: 8.2 10*3/uL (ref 4.0–10.5)

## 2017-10-29 LAB — GLUCOSE, CAPILLARY
Glucose-Capillary: 115 mg/dL — ABNORMAL HIGH (ref 65–99)
Glucose-Capillary: 125 mg/dL — ABNORMAL HIGH (ref 65–99)
Glucose-Capillary: 167 mg/dL — ABNORMAL HIGH (ref 65–99)
Glucose-Capillary: 206 mg/dL — ABNORMAL HIGH (ref 65–99)
Glucose-Capillary: 275 mg/dL — ABNORMAL HIGH (ref 65–99)

## 2017-10-29 LAB — MAGNESIUM: Magnesium: 2.2 mg/dL (ref 1.7–2.4)

## 2017-10-29 MED ORDER — INSULIN ASPART 100 UNIT/ML ~~LOC~~ SOLN
0.0000 [IU] | Freq: Every day | SUBCUTANEOUS | Status: DC
  Administered 2017-10-29: 3 [IU] via SUBCUTANEOUS
  Administered 2017-10-30: 2 [IU] via SUBCUTANEOUS

## 2017-10-29 MED ORDER — INSULIN ASPART 100 UNIT/ML ~~LOC~~ SOLN
0.0000 [IU] | Freq: Three times a day (TID) | SUBCUTANEOUS | Status: DC
  Administered 2017-10-30: 2 [IU] via SUBCUTANEOUS

## 2017-10-29 NOTE — Progress Notes (Addendum)
Patient's magnesium up to 2.2 after run of magnesium. 8 am Tikosyn dose will be given at 6 am. Pharmacist Carleene Overlie aware and will retime subsequent doses. Will continue to monitor patient.

## 2017-10-29 NOTE — Progress Notes (Signed)
Patient resting comfortably during shift report. Denies complaints.  

## 2017-10-29 NOTE — Progress Notes (Signed)
Pt requests only 25 units of lantus, not full 35 states he slides it at home per glucose readings

## 2017-10-29 NOTE — Progress Notes (Signed)
Progress Note  Patient Name: Marvin Salinas Date of Encounter: 10/29/2017  Primary Cardiologist: Mertie Moores, MD   Subjective   Without complaints  Tolerating dofetide  Inpatient Medications    Scheduled Meds: . clopidogrel  75 mg Oral Daily  . dofetilide  125 mcg Oral BID  . ezetimibe  10 mg Oral Daily  . fenofibrate  160 mg Oral Daily  . hydrochlorothiazide  12.5 mg Oral Daily  . insulin aspart  0-9 Units Subcutaneous Q4H  . insulin glargine  35 Units Subcutaneous BID  . metoprolol tartrate  12.5 mg Oral BID  . omega-3 acid ethyl esters  2 g Oral Daily  . pravastatin  80 mg Oral QHS  . rivaroxaban  15 mg Oral Q supper   Continuous Infusions:  PRN Meds: acetaminophen, fluticasone, loratadine, nitroGLYCERIN, ondansetron (ZOFRAN) IV   Vital Signs    Vitals:   10/28/17 1926 10/28/17 2339 10/29/17 0007 10/29/17 0411  BP: 138/60 (!) 141/71 (!) 158/70 (!) 153/71  Pulse: (!) 57 (!) 58 (!) 55 (!) 56  Resp: 18  18 18   Temp: 97.6 F (36.4 C)  98.7 F (37.1 C) 97.6 F (36.4 C)  TempSrc: Oral  Oral Oral  SpO2: 96%  97% 96%  Weight:    179 lb 9.6 oz (81.5 kg)  Height:        Intake/Output Summary (Last 24 hours) at 10/29/2017 0842 Last data filed at 10/29/2017 0538 Gross per 24 hour  Intake 1010 ml  Output 200 ml  Net 810 ml   Filed Weights   10/27/17 1626 10/28/17 0357 10/29/17 0411  Weight: 182 lb (82.6 kg) 180 lb 12.8 oz (82 kg) 179 lb 9.6 oz (81.5 kg)    Telemetry     sinus  Personally Reviewed  ECG    QTc < 440 - Personally Reviewed  Physical Exam   Well developed and nourished in no acute distress HENT normal Neck supple with JVP-flat Clear Regular rate and rhythm, no murmurs or gallops Abd-soft with active BS No Clubbing cyanosis edema Skin-warm and dry A & Oriented  Grossly normal sensory and motor function   Labs    Chemistry Recent Labs  Lab 10/27/17 0038 10/28/17 0622 10/29/17 0357  NA 138 141 139  K 4.5 4.2 4.0  CL 105 105  106  CO2 25 27 25   GLUCOSE 346* 97 123*  BUN 28* 28* 30*  CREATININE 2.12* 1.92* 1.98*  CALCIUM 9.7 9.8 9.5  GFRNONAA 29* 33* 32*  GFRAA 34* 39* 37*  ANIONGAP 8 9 8      Hematology Recent Labs  Lab 10/27/17 0038 10/28/17 0622 10/29/17 0357  WBC 8.5 8.1 8.2  RBC 4.84 4.94 4.62  HGB 14.7 15.5 14.1  HCT 43.9 45.4 41.1  MCV 90.7 91.9 89.0  MCH 30.4 31.4 30.5  MCHC 33.5 34.1 34.3  RDW 13.5 13.8 13.3  PLT 260 274 245    Cardiac Enzymes Recent Labs  Lab 10/27/17 0347 10/27/17 0935 10/27/17 1703  TROPONINI 0.19* 0.66* 0.49*    Recent Labs  Lab 10/27/17 0044  TROPIPOC 0.02     BNPNo results for input(s): BNP, PROBNP in the last 168 hours.   DDimer No results for input(s): DDIMER in the last 168 hours.   Radiology    Dg Chest 2 View Result Date: 10/27/2017 CLINICAL DATA:  72 year old male with chest pain. EXAM: CHEST - 2 VIEW COMPARISON:  Chest radiograph dated 10/11/2017 FINDINGS: The lungs are clear. There is no  pleural effusion or pneumothorax. The cardiac silhouette is within normal limits. Median sternotomy wires and CABG changes noted. No acute osseous pathology. IMPRESSION: No active cardiopulmonary disease. Electronically Signed   By: Anner Crete M.D.   On: 10/27/2017 01:54    Cardiac Studies   10/13/17: [facility] Conclusions: 1. Severe 3-vessel coronary artery disease with chronic total occlusions of the proximal/mid LAD, mid/distal LCx, and proximal RCA. 2. Moderate caliber D1 and distal rPDA demonstrate 70-80% stenoses and are not revascularized. 3. Widely patent LIMA-LAD, SVG-OM2, and SVG-rPDA. 4. Moderately elevated left ventricular filling pressure (LVEDP 28 mmHg).  Recommendations: 1. Optimize medical therapy. OM1 and mid/distal LCx occlusions are new since 2011 but involve relatively small territories. Additionally, faint collaterals are present. I suspect chest pain was due to demand ischemia in the setting of atrial flutter, though troponin  elevation seems a bit excessive for supply-demand mismatch. 2. If [name] has recurrent chest pain despite aggressive medical therapy, PCI to D1 could be considered in the future. Distal rPDA stenosis is too small/distal for percutaneous intervention. 3. No further IV fluids at this time. Consider gentle diuresis beginning tomorrow as renal function allows, given moderately elevated LVEDP. 4. Restart heparin in 4 hours. Given recent atrial flutter, [name] would benefit from anticoagulation in addition to antiplatelet therapy (favor NOAC + clopidogrel or ASA).  10/12/17: TTE Study Conclusions - Left ventricle: The cavity size was normal. Wall thickness was increased in a pattern of mild LVH. Systolic function was normal. The estimated ejection fraction was in the range of 55% to 60%. Wall motion was normal; there were no regional wall motion abnormalities. Left ventricular diastolic function parameters were normal. Impressions: - Normal LV systolic function; mild diastolic dysfunction; mild LVH.   Patient Profile     72 y.o. male with a hx of CAD (CABG), had a recent admission for new onset atrial flutter and NSTEMI with cath showing severe, native, 3 vessel CAD and 3/3 patent grafts but new OM1 and mid/distal LCx occlusions, treated medically, discharged in [place] after spontaneous conversion, HTN, DM, HLD, CKD (III), smoker, TIA, peripheral vascular disease b/l LE, admitted with sudden onset of chest heaviness and racing heart, making him feel weak, "awful".  He stated while in the ER, he stood to use the BR and could tell immediately that his HR went back to normal.  He had a recent admission for new onset rapid atrial flutter and NSTEMI with cath showing severe, native, 3 vessel CAD and 3/3 patent grafts but new OM1 and mid/distal LCx occlusions, treated medically, during that stay he was stared on [facility] for a/c, discharged in [place].  Assessment & Plan      Coronary artery  disease  Normal LV function  Renal insufficiency grade 4  Atrial flutter-atypical   Tolerating dofetilide  Continue loading with anticipated discharge on Monday  Has noted previously, if recurrentarrhtyhmia would add ranolazine, but as long term strategy regardless would consider for ablation

## 2017-10-29 NOTE — Progress Notes (Signed)
Pharmacy Review for Dofetilide (Tikosyn) Initiation  Admit Complaint: 72 y.o. male admitted 10/27/2017 with atrial fibrillation to be initiated on dofetilide.   Assessment:  Patient Exclusion Criteria: If any screening criteria checked as "Yes", then  patient  should NOT receive dofetilide until criteria item is corrected. If "Yes" please indicate correction plan.  YES  NO Patient  Exclusion Criteria Correction Plan  []  [x]  Baseline QTc interval is greater than or equal to 440 msec. IF above YES box checked dofetilide contraindicated unless patient has ICD; then may proceed if QTc 500-550 msec or with known ventricular conduction abnormalities may proceed with QTc 550-600 msec. QTc = 421   []  [x]  Magnesium level is less than 1.8 mEq/l : Last magnesium:  Lab Results  Component Value Date   MG 2.2 10/29/2017         []  [x]  Potassium level is less than 4 mEq/l : Last potassium:  Lab Results  Component Value Date   K 4.0 10/29/2017         []  [x]  Patient is known or suspected to have a digoxin level greater than 2 ng/ml: No results found for: DIGOXIN    []  [x]  Creatinine clearance less than 20 ml/min (calculated using Cockcroft-Gault, actual body weight and serum creatinine): Estimated Creatinine Clearance: 32.6 mL/min (A) (by C-G formula based on SCr of 1.98 mg/dL (H)).    []  []  Patient has received drugs known to prolong the QT intervals within the last 48 hours (phenothiazines, tricyclics or tetracyclic antidepressants, erythromycin, H-1 antihistamines, cisapride, fluoroquinolones, azithromycin). Drugs not listed above may have an, as yet, undetected potential to prolong the QT interval, updated information on QT prolonging agents is available at this website:QT prolonging agents   [x]  []  Patient received a dose of hydrochlorothiazide (Oretic) alone or in any combination including triamterene (Dyazide, Maxzide) in the last 48 hours. Paged EP - d/c'd order per EP PA [place]  []  [x]   Patient received a medication known to increase dofetilide plasma concentrations prior to initial dofetilide dose:  . Trimethoprim (Primsol, Proloprim) in the last 36 hours . Verapamil (Calan, Verelan) in the last 36 hours or a sustained release dose in the last 72 hours . Megestrol (Megace) in the last 5 days  . Cimetidine (Tagamet) in the last 6 hours . Ketoconazole (Nizoral) in the last 24 hours . Itraconazole (Sporanox) in the last 48 hours  . Prochlorperazine (Compazine) in the last 36 hours    []  [x]  Patient is known to have a history of torsades de pointes; congenital or acquired long QT syndromes.   []  [x]  Patient has received a Class 1 antiarrhythmic with less than 2 half-lives since last dose. (Disopyramide, Quinidine, Procainamide, Lidocaine, Mexiletine, Flecainide, Propafenone)   []  [x]  Patient has received amiodarone therapy in the past 3 months or amiodarone level is greater than 0.3 ng/ml.    Patient has been appropriately anticoagulated with Xarelto.  Ordering provider was confirmed at [email or web address] if they are not listed on the [place] Prescribers list.  Goal of Therapy: Follow renal function, electrolytes, potential drug interactions, and dose adjustment. Provide education and 1 week supply at discharge.  Plan:  [x]   Physician selected initial dose within range recommended for patients level of renal function - will monitor for response.  []   Physician selected initial dose outside of range recommended for patients level of renal function - will discuss if the dose should be altered at this time.   [facility] One Calculated CrCl  Dose  q12h  []  > 60 ml/min 500 mcg  []  40-60 ml/min 250 mcg  [x]  20-40 ml/min 125 mcg   2. Follow up QTc after the first 5 doses, renal function, electrolytes (K & Mg) daily x 3     days, dose adjustment, success of initiation and facilitate 1 week discharge supply as     clinically indicated.  3. Initiate Tikosyn education video  (Call [phone] and ask for Tikosyn Video # 116).  4. Place Enrollment Form on the chart for discharge supply of dofetilide.  Elicia Lamp, PharmD, BCPS Clinical Pharmacist Clinical phone for 10/29/2017 until 3:30pm: [phone] If after 3:30pm, please call main pharmacy at: [phone] 10/29/2017 1:12 PM

## 2017-10-29 NOTE — Progress Notes (Signed)
[facility]                                                                              Patient Demographics  Marvin Salinas, is a 72 y.o. male, DOB - Nov 09, 1945, NAT:[id number]  Admit date - 10/27/2017   Admitting Physician Rise Patience, MD  Outpatient Primary MD for the patient is Aletha Halim., PA-C  Outpatient specialists:   LOS - 0  days   Medical records reviewed and are as summarized below:    Chief Complaint  Patient presents with  . Chest Pain       Brief summary   Patient is a 72 year old male with history of CAD status post CABG recently admitted for new onset atrial flutter and had NSTEMI, underwent cardiac cath was instructed medical management.  Patient return back with chest pain along with palpitations that started around 11 PM at home.  In ED, patient was found to be in atrial fibrillation with RVR, spontaneously converted and chest pain resolved. Troponin positive  Assessment & Plan    Principal Problem:   Chest pain with underlying history of CAD status post CABG, possibly worsened due to atrial fib with RVR -Mildly elevated troponins with chest pain due to symptomatic A. fib -Converted to normal sinus rhythm.  Cardiology and EP cardiology was consulted  -Continue Xarelto, Plavix, beta-blocker currently converted to normal sinus rhythm, heart rate in 60s, continue beta-blocker  Active Problems:   Atrial fibrillation with RVR ([place]) -Converted to normal sinus rhythm however is symptomatic when he has RVR. - Continue Lopressor - Continue Xarelto, [place] vasc 6  -Patient was seen by EP cardiology and was recommended amiodarone Dr. Lovena Le, due to CAD and CKD, all other antiarrhythmic drugs contraindicated.  Patient however refused to take amiodarone stating previous bad experience with amiodarone, felt photosensitivity and "almost blind". -Patient was then placed on dofetilide, so far tolerating.   Essential HTN  (hypertension) -Continue metoprolol, HCTZ, hold ARB   Tobacco use disorder -Counseled on tobacco cessation  Diabetes mellitus type 2 uncontrolled with hyperglycemia - CBGs fairly stable, continue Lantus, sliding scale insulin    Hyperlipidemia Continue Zetia  Chronic kidney disease stage III -Baseline creatinine 1.8-2 -Creatinine 1.9, improving  Code Status: Full CODE STATUS DVT Prophylaxis: Xarelto Family Communication: Discussed in detail with the patient, all imaging results, lab results explained to the patient and wife   Disposition Plan: On Monday as per recommendations from EP cardiology  Time Spent in minutes   25 minutes  Procedures:  None  Consultants:   Cardiology  Antimicrobials:      Medications  Scheduled Meds: . clopidogrel  75 mg Oral Daily  . dofetilide  125 mcg Oral BID  . ezetimibe  10 mg Oral Daily  . fenofibrate  160 mg Oral Daily  . hydrochlorothiazide  12.5 mg Oral Daily  . insulin aspart  0-9 Units Subcutaneous Q4H  . insulin glargine  35 Units Subcutaneous BID  . metoprolol tartrate  12.5 mg Oral BID  . omega-3 acid ethyl esters  2 g Oral Daily  . pravastatin  80 mg Oral QHS  . rivaroxaban  15 mg Oral Q supper   Continuous Infusions: PRN Meds:.acetaminophen, fluticasone, loratadine, nitroGLYCERIN, ondansetron (ZOFRAN) IV   Antibiotics   Anti-infectives (From admission, onward)   None        Subjective:   Marvin Salinas was seen and examined today.  Feeling better today, no acute issues overnight.  No chest pain or shortness of breath.  No dizziness, palpitations.   Patient denies dizziness, abdominal pain, N/V/D/C, new weakness, numbess, tingling.  Objective:   Vitals:   10/28/17 1926 10/28/17 2339 10/29/17 0007 10/29/17 0411  BP: 138/60 (!) 141/71 (!) 158/70 (!) 153/71  Pulse: (!) 57 (!) 58 (!) 55 (!) 56  Resp: 18  18 18   Temp: 97.6 F (36.4 C)  98.7 F (37.1 C) 97.6 F (36.4 C)  TempSrc: Oral  Oral Oral  SpO2:  96%  97% 96%  Weight:    81.5 kg (179 lb 9.6 oz)  Height:        Intake/Output Summary (Last 24 hours) at 10/29/2017 1044 Last data filed at 10/29/2017 0900 Gross per 24 hour  Intake 1010 ml  Output 200 ml  Net 810 ml     Wt Readings from Last 3 Encounters:  10/29/17 81.5 kg (179 lb 9.6 oz)  10/26/17 84.5 kg (186 lb 4 oz)  10/14/17 83.5 kg (184 lb)     Exam    General: Alert and oriented x 3, NAD  Eyes:  HEENT:    Cardiovascular: S1 S2 auscultated,  Regular rate and rhythm. No pedal edema b/l  Respiratory: Clear to auscultation bilaterally, no wheezing, rales or rhonchi  Gastrointestinal: Soft, nontender, nondistended, + bowel sounds  Ext: no pedal edema bilaterally  Neuro:   Musculoskeletal: No digital cyanosis, clubbing  Skin: No rashes  Psych: Normal affect and demeanor, alert and oriented x3    Data Reviewed:  I have personally reviewed following labs and imaging studies  Micro Results No results found for this or any previous visit (from the past 240 hour(s)).  Radiology Reports Dg Chest 2 View  Result Date: 10/27/2017 CLINICAL DATA:  72 year old male with chest pain. EXAM: CHEST - 2 VIEW COMPARISON:  Chest radiograph dated 10/11/2017 FINDINGS: The lungs are clear. There is no pleural effusion or pneumothorax. The cardiac silhouette is within normal limits. Median sternotomy wires and CABG changes noted. No acute osseous pathology. IMPRESSION: No active cardiopulmonary disease. Electronically Signed   By: Anner Crete M.D.   On: 10/27/2017 01:54   Dg Chest 2 View  Result Date: 10/11/2017 CLINICAL DATA:  Central chest pressure and dyspnea since 6 p.m. EXAM: CHEST - 2 VIEW COMPARISON:  03/05/2011 FINDINGS: Stable cardiomegaly with aortic atherosclerosis. No aneurysm. Status post CABG. Chronic interstitial prominence without alveolar consolidation, effusion or pneumothorax. No acute osseous abnormality. IMPRESSION: 1. Chronic interstitial prominence  without acute pulmonary disease. 2. Cardiomegaly with aortic atherosclerosis. Stable post CABG change. Electronically Signed   By: Ashley Royalty M.D.   On: 10/11/2017 21:34   US Renal  Result Date: 10/12/2017 CLINICAL DATA:  Acute kidney injury. Chronic kidney disease stage 3. EXAM: RENAL / URINARY TRACT ULTRASOUND COMPLETE COMPARISON:  CT AP 06/16/2014. FINDINGS: Right Kidney: Length: 11.6 cm. Mild perinephric fluid around the inferior pole noted. Echogenicity within normal limits. No mass or hydronephrosis visualized. Left Kidney: Length: 12.2 cm. Echogenicity within normal limits. No mass or hydronephrosis visualized. Bladder: Appears normal for degree of bladder distention. Other:  Small volume of perihepatic ascites. IMPRESSION: 1. Normal cortical echogenicity of both kidneys without  hydronephrosis. 2. Mild perinephric fluid surrounds the lower pole of the right kidney, which may be related to recent acute kidney injury. Electronically Signed   By: Kerby Moors M.D.   On: 10/12/2017 12:20    Lab Data:  CBC: Recent Labs  Lab 10/27/17 0038 10/28/17 0622 10/29/17 0357  WBC 8.5 8.1 8.2  HGB 14.7 15.5 14.1  HCT 43.9 45.4 41.1  MCV 90.7 91.9 89.0  PLT 260 274 003   Basic Metabolic Panel: Recent Labs  Lab 10/27/17 0038 10/28/17 0622 10/28/17 1918 10/29/17 0357  NA 138 141  --  139  K 4.5 4.2  --  4.0  CL 105 105  --  106  CO2 25 27  --  25  GLUCOSE 346* 97  --  123*  BUN 28* 28*  --  30*  CREATININE 2.12* 1.92*  --  1.98*  CALCIUM 9.7 9.8  --  9.5  MG  --   --  1.8 2.2   GFR: Estimated Creatinine Clearance: 32.6 mL/min (A) (by C-G formula based on SCr of 1.98 mg/dL (H)). Liver Function Tests: No results for input(s): AST, ALT, ALKPHOS, BILITOT, PROT, ALBUMIN in the last 168 hours. No results for input(s): LIPASE, AMYLASE in the last 168 hours. No results for input(s): AMMONIA in the last 168 hours. Coagulation Profile: No results for input(s): INR, PROTIME in the last 168  hours. Cardiac Enzymes: Recent Labs  Lab 10/27/17 0347 10/27/17 0935 10/27/17 1703  TROPONINI 0.19* 0.66* 0.49*   BNP (last 3 results) No results for input(s): PROBNP in the last 8760 hours. HbA1C: No results for input(s): HGBA1C in the last 72 hours. CBG: Recent Labs  Lab 10/28/17 1633 10/28/17 2113 10/28/17 2314 10/29/17 0409 10/29/17 0844  GLUCAP 188* 219* 170* 115* 125*   Lipid Profile: No results for input(s): CHOL, HDL, LDLCALC, TRIG, CHOLHDL, LDLDIRECT in the last 72 hours. Thyroid Function Tests: Recent Labs    10/27/17 0347  TSH 0.903   Anemia Panel: No results for input(s): VITAMINB12, FOLATE, FERRITIN, TIBC, IRON, RETICCTPCT in the last 72 hours. Urine analysis:    Component Value Date/Time   COLORURINE YELLOW 10/12/2017 0031   APPEARANCEUR HAZY (A) 10/12/2017 0031   LABSPEC 1.021 10/12/2017 0031   PHURINE 5.0 10/12/2017 0031   GLUCOSEU NEGATIVE 10/12/2017 0031   HGBUR NEGATIVE 10/12/2017 0031   BILIRUBINUR NEGATIVE 10/12/2017 0031   KETONESUR 5 (A) 10/12/2017 0031   PROTEINUR 30 (A) 10/12/2017 0031   UROBILINOGEN 0.2 06/16/2014 1211   NITRITE NEGATIVE 10/12/2017 0031   LEUKOCYTESUR NEGATIVE 10/12/2017 0031     Ripudeep Rai M.D. [facility] 10/29/2017, 10:44 AM  Pager: [phone] Between 7am to 7pm - call Pager - [phone]  After 7pm go to [email or web address] - password [email or web address]  Call night coverage person covering after 7pm

## 2017-10-29 NOTE — Progress Notes (Signed)
Magnesium infusion complete. Order placed for Mag level to be drawn. No other complaints at this time. Will wait on lab result to give patient tikosyn.

## 2017-10-29 NOTE — Progress Notes (Signed)
RN checked labs. Mag was 1.8. Tikosyn placed on hold and NP called. Orders to run some magnesium and then placed verbal orders for a lab draw. Will pass on to on-coming RN.

## 2017-10-30 LAB — GLUCOSE, CAPILLARY
Glucose-Capillary: 114 mg/dL — ABNORMAL HIGH (ref 65–99)
Glucose-Capillary: 156 mg/dL — ABNORMAL HIGH (ref 65–99)
Glucose-Capillary: 164 mg/dL — ABNORMAL HIGH (ref 65–99)
Glucose-Capillary: 192 mg/dL — ABNORMAL HIGH (ref 65–99)
Glucose-Capillary: 228 mg/dL — ABNORMAL HIGH (ref 65–99)

## 2017-10-30 LAB — CBC
HCT: 42.5 % (ref 39.0–52.0)
Hemoglobin: 14.6 g/dL (ref 13.0–17.0)
MCH: 30.5 pg (ref 26.0–34.0)
MCHC: 34.4 g/dL (ref 30.0–36.0)
MCV: 88.9 fL (ref 78.0–100.0)
Platelets: 248 10*3/uL (ref 150–400)
RBC: 4.78 MIL/uL (ref 4.22–5.81)
RDW: 13.2 % (ref 11.5–15.5)
WBC: 7.5 10*3/uL (ref 4.0–10.5)

## 2017-10-30 LAB — BASIC METABOLIC PANEL
Anion gap: 7 (ref 5–15)
BUN: 33 mg/dL — ABNORMAL HIGH (ref 6–20)
CO2: 28 mmol/L (ref 22–32)
Calcium: 9.7 mg/dL (ref 8.9–10.3)
Chloride: 104 mmol/L (ref 101–111)
Creatinine, Ser: 2.15 mg/dL — ABNORMAL HIGH (ref 0.61–1.24)
GFR calc Af Amer: 34 mL/min — ABNORMAL LOW (ref >60)
GFR calc non Af Amer: 29 mL/min — ABNORMAL LOW (ref >60)
Glucose, Bld: 143 mg/dL — ABNORMAL HIGH (ref 65–99)
Potassium: 4.2 mmol/L (ref 3.5–5.1)
Sodium: 139 mmol/L (ref 135–145)

## 2017-10-30 MED ORDER — SODIUM CHLORIDE 0.9 % IV SOLN
INTRAVENOUS | Status: DC
  Administered 2017-10-30 – 2017-10-31 (×3): via INTRAVENOUS

## 2017-10-30 NOTE — Progress Notes (Signed)
Progress Note  Patient Name: Marvin Salinas Date of Encounter: 10/30/2017  Primary Cardiologist: Mertie Moores, MD   Subjective   Feeling well, tolerating Tikosyn without issue.  Inpatient Medications    Scheduled Meds: . clopidogrel  75 mg Oral Daily  . dofetilide  125 mcg Oral BID  . ezetimibe  10 mg Oral Daily  . fenofibrate  160 mg Oral Daily  . insulin aspart  0-5 Units Subcutaneous QHS  . insulin aspart  0-9 Units Subcutaneous TID WC  . insulin glargine  35 Units Subcutaneous BID  . metoprolol tartrate  12.5 mg Oral BID  . omega-3 acid ethyl esters  2 g Oral Daily  . pravastatin  80 mg Oral QHS  . rivaroxaban  15 mg Oral Q supper   Continuous Infusions:  PRN Meds: acetaminophen, fluticasone, loratadine, nitroGLYCERIN, ondansetron (ZOFRAN) IV   Vital Signs    Vitals:   10/29/17 1108 10/29/17 1154 10/29/17 1934 10/30/17 0416  BP: 124/69 122/72 (!) 124/59 (!) 146/68  Pulse: 62 61 64 (!) 56  Resp:  20 18 18   Temp:  97.9 F (36.6 C) 97.8 F (36.6 C) (!) 97.5 F (36.4 C)  TempSrc:  Oral Oral Oral  SpO2:  98% 97% 98%  Weight:    178 lb 12.8 oz (81.1 kg)  Height:        Intake/Output Summary (Last 24 hours) at 10/30/2017 0856 Last data filed at 10/30/2017 0500 Gross per 24 hour  Intake 1540 ml  Output 1725 ml  Net -185 ml   Filed Weights   10/28/17 0357 10/29/17 0411 10/30/17 0416  Weight: 180 lb 12.8 oz (82 kg) 179 lb 9.6 oz (81.5 kg) 178 lb 12.8 oz (81.1 kg)    Telemetry    Sinus rhythm - personally reviewed  ECG    Sinus rhythm - personally reviewed  Physical Exam   GEN: Well nourished, well developed, in no acute distress  HEENT: normal  Neck: no JVD, carotid bruits, or masses Cardiac: RRR; no murmurs, rubs, or gallops,no edema  Respiratory:  clear to auscultation bilaterally, normal work of breathing GI: soft, nontender, nondistended, + BS MS: no deformity or atrophy  Skin: warm and dry Neuro:  Strength and sensation are intact Psych:  euthymic mood, full affect    Labs    Chemistry Recent Labs  Lab 10/28/17 0622 10/29/17 0357 10/30/17 0634  NA 141 139 139  K 4.2 4.0 4.2  CL 105 106 104  CO2 27 25 28   GLUCOSE 97 123* 143*  BUN 28* 30* 33*  CREATININE 1.92* 1.98* 2.15*  CALCIUM 9.8 9.5 9.7  GFRNONAA 33* 32* 29*  GFRAA 39* 37* 34*  ANIONGAP 9 8 7      Hematology Recent Labs  Lab 10/28/17 0622 10/29/17 0357 10/30/17 0634  WBC 8.1 8.2 7.5  RBC 4.94 4.62 4.78  HGB 15.5 14.1 14.6  HCT 45.4 41.1 42.5  MCV 91.9 89.0 88.9  MCH 31.4 30.5 30.5  MCHC 34.1 34.3 34.4  RDW 13.8 13.3 13.2  PLT 274 245 248    Cardiac Enzymes Recent Labs  Lab 10/27/17 0347 10/27/17 0935 10/27/17 1703  TROPONINI 0.19* 0.66* 0.49*    Recent Labs  Lab 10/27/17 0044  TROPIPOC 0.02     BNPNo results for input(s): BNP, PROBNP in the last 168 hours.   DDimer No results for input(s): DDIMER in the last 168 hours.   Radiology    Dg Chest 2 View Result Date: 10/27/2017 CLINICAL DATA:  72 year old male with chest pain. EXAM: CHEST - 2 VIEW COMPARISON:  Chest radiograph dated 10/11/2017 FINDINGS: The lungs are clear. There is no pleural effusion or pneumothorax. The cardiac silhouette is within normal limits. Median sternotomy wires and CABG changes noted. No acute osseous pathology. IMPRESSION: No active cardiopulmonary disease. Electronically Signed   By: Anner Crete M.D.   On: 10/27/2017 01:54    Cardiac Studies   10/13/17: [facility] Conclusions: 1. Severe 3-vessel coronary artery disease with chronic total occlusions of the proximal/mid LAD, mid/distal LCx, and proximal RCA. 2. Moderate caliber D1 and distal rPDA demonstrate 70-80% stenoses and are not revascularized. 3. Widely patent LIMA-LAD, SVG-OM2, and SVG-rPDA. 4. Moderately elevated left ventricular filling pressure (LVEDP 28 mmHg).  Recommendations: 1. Optimize medical therapy. OM1 and mid/distal LCx occlusions are new since 2011 but involve relatively small  territories. Additionally, faint collaterals are present. I suspect chest pain was due to demand ischemia in the setting of atrial flutter, though troponin elevation seems a bit excessive for supply-demand mismatch. 2. If Mr. Fariss has recurrent chest pain despite aggressive medical therapy, PCI to D1 could be considered in the future. Distal rPDA stenosis is too small/distal for percutaneous intervention. 3. No further IV fluids at this time. Consider gentle diuresis beginning tomorrow as renal function allows, given moderately elevated LVEDP. 4. Restart heparin in 4 hours. Given recent atrial flutter, Mr. Tomerlin would benefit from anticoagulation in addition to antiplatelet therapy (favor NOAC + clopidogrel or ASA).  10/12/17: TTE Study Conclusions - Left ventricle: The cavity size was normal. Wall thickness was increased in a pattern of mild LVH. Systolic function was normal. The estimated ejection fraction was in the range of 55% to 60%. Wall motion was normal; there were no regional wall motion abnormalities. Left ventricular diastolic function parameters were normal. Impressions: - Normal LV systolic function; mild diastolic dysfunction; mild LVH.   Patient Profile     72 y.o. male with a hx of CAD (CABG), had a recent admission for new onset atrial flutter and NSTEMI with cath showing severe, native, 3 vessel CAD and 3/3 patent grafts but new OM1 and mid/distal LCx occlusions, treated medically, discharged in [place] after spontaneous conversion, HTN, DM, HLD, CKD (III), smoker, TIA, peripheral vascular disease b/l LE, admitted with sudden onset of chest heaviness and racing heart, making him feel weak, "awful".  He stated while in the ER, he stood to use the BR and could tell immediately that his HR went back to normal.  He had a recent admission for new onset rapid atrial flutter and NSTEMI with cath showing severe, native, 3 vessel CAD and 3/3 patent grafts but new OM1  and mid/distal LCx occlusions, treated medically, during that stay he was stared on [facility] for a/c, discharged in [place].  Assessment & Plan      Coronary artery disease  Normal LV function  Renal insufficiency grade 4  Atrial flutter-atypical   Currently tolerating tikosyn without issue. Last dose likely tomorrow. QTc has remained stable. Continues to be in sinus rhythm. May be an ablation candidate if tikosyn fails. Jayd Forrey check BMP and Mg in the AM.

## 2017-10-30 NOTE — Progress Notes (Signed)
[facility]                                                                              Patient Demographics  Marvin Salinas, is a 72 y.o. male, DOB - 24-Apr-1946, CWC:[id number]  Admit date - 10/27/2017   Admitting Physician Rise Patience, MD  Outpatient Primary MD for the patient is Aletha Halim., PA-C  Outpatient specialists:   LOS - 1  days   Medical records reviewed and are as summarized below:    Chief Complaint  Patient presents with  . Chest Pain       Brief summary   Patient is a 72 year old male with history of CAD status post CABG recently admitted for new onset atrial flutter and had NSTEMI, underwent cardiac cath was instructed medical management.  Patient return back with chest pain along with palpitations that started around 11 PM at home.  In ED, patient was found to be in atrial fibrillation with RVR, spontaneously converted and chest pain resolved. Troponin positive  Assessment & Plan    Principal Problem:   Chest pain with underlying history of CAD status post CABG, possibly worsened due to atrial fib with RVR - Mildly elevated troponins with chest pain due to symptomatic A. fib -Converted to normal sinus rhythm.  Cardiology and EP cardiology was consulted  -Continue Xarelto, Plavix, beta-blocker currently converted to normal sinus rhythm  Active Problems:   Atrial fibrillation with RVR ([place]) -Converted to normal sinus rhythm however is symptomatic when he has RVR. - Continue Lopressor - Continue Xarelto, [place] vasc 6  -Patient was seen by EP cardiology and was recommended amiodarone Dr. Lovena Le, due to CAD and CKD, all other antiarrhythmic drugs contraindicated.  Patient however refused to take amiodarone stating previous bad experience with amiodarone, felt photosensitivity and "almost blind". -Patient was then placed on dofetilide, so far tolerating.  Follow QTC, BMET in a.m.  Essential HTN (hypertension) -BP stable, continue  beta-blocker, HCTZ.  ARB on hold.     Tobacco use disorder -Counseled on tobacco cessation  Diabetes mellitus type 2 uncontrolled with hyperglycemia - CBGs fairly stable, continue Lantus, sliding scale insulin    Hyperlipidemia Continue Zetia  Acute on chronic kidney disease stage III -Baseline creatinine 1.8-2 -Creatinine trended up to 2.1 today, placed on gentle hydration, continue to hold ARB  Code Status: Full CODE STATUS DVT Prophylaxis: Xarelto Family Communication: Discussed in detail with the patient, all imaging results, lab results explained to the patient and wife   Disposition Plan: Possibly in a.m.  Time Spent in minutes   25 minutes  Procedures:  None  Consultants:   Cardiology  Antimicrobials:      Medications  Scheduled Meds: . clopidogrel  75 mg Oral Daily  . dofetilide  125 mcg Oral BID  . ezetimibe  10 mg Oral Daily  . fenofibrate  160 mg Oral Daily  . insulin aspart  0-5 Units Subcutaneous QHS  . insulin aspart  0-9 Units Subcutaneous TID WC  . insulin glargine  35 Units Subcutaneous BID  . metoprolol tartrate  12.5 mg Oral BID  . omega-3 acid ethyl esters  2 g Oral Daily  . pravastatin  80 mg Oral QHS  . rivaroxaban  15 mg Oral Q supper   Continuous Infusions: PRN Meds:.acetaminophen, fluticasone, loratadine, nitroGLYCERIN, ondansetron (ZOFRAN) IV   Antibiotics   Anti-infectives (From admission, onward)   None        Subjective:   Spenser Harren was seen and examined today.  No complaints, tolerating Tikosyn.  No issues overnight.  No palpitations.   Patient denies dizziness, abdominal pain, N/V/D/C, new weakness, numbess, tingling.  Objective:   Vitals:   10/29/17 1108 10/29/17 1154 10/29/17 1934 10/30/17 0416  BP: 124/69 122/72 (!) 124/59 (!) 146/68  Pulse: 62 61 64 (!) 56  Resp:  20 18 18   Temp:  97.9 F (36.6 C) 97.8 F (36.6 C) (!) 97.5 F (36.4 C)  TempSrc:  Oral Oral Oral  SpO2:  98% 97% 98%  Weight:    81.1 kg  (178 lb 12.8 oz)  Height:        Intake/Output Summary (Last 24 hours) at 10/30/2017 1046 Last data filed at 10/30/2017 0500 Gross per 24 hour  Intake 1300 ml  Output 1725 ml  Net -425 ml     Wt Readings from Last 3 Encounters:  10/30/17 81.1 kg (178 lb 12.8 oz)  10/26/17 84.5 kg (186 lb 4 oz)  10/14/17 83.5 kg (184 lb)     Exam    General: Alert and oriented x 3, NAD  Eyes:  HEENT:    Cardiovascular: S1 S2 auscultated, Regular rate and rhythm. No pedal edema b/l  Respiratory: CTAB  Gastrointestinal: Soft, nontender, nondistended, + bowel sounds  Ext: no pedal edema bilaterally  Neuro: no new deficit  Musculoskeletal: No digital cyanosis, clubbing  Skin: No rashes  Psych: Normal affect and demeanor, alert and oriented x3    Data Reviewed:  I have personally reviewed following labs and imaging studies  Micro Results No results found for this or any previous visit (from the past 240 hour(s)).  Radiology Reports Dg Chest 2 View  Result Date: 10/27/2017 CLINICAL DATA:  72 year old male with chest pain. EXAM: CHEST - 2 VIEW COMPARISON:  Chest radiograph dated 10/11/2017 FINDINGS: The lungs are clear. There is no pleural effusion or pneumothorax. The cardiac silhouette is within normal limits. Median sternotomy wires and CABG changes noted. No acute osseous pathology. IMPRESSION: No active cardiopulmonary disease. Electronically Signed   By: Anner Crete M.D.   On: 10/27/2017 01:54   Dg Chest 2 View  Result Date: 10/11/2017 CLINICAL DATA:  Central chest pressure and dyspnea since 6 p.m. EXAM: CHEST - 2 VIEW COMPARISON:  03/05/2011 FINDINGS: Stable cardiomegaly with aortic atherosclerosis. No aneurysm. Status post CABG. Chronic interstitial prominence without alveolar consolidation, effusion or pneumothorax. No acute osseous abnormality. IMPRESSION: 1. Chronic interstitial prominence without acute pulmonary disease. 2. Cardiomegaly with aortic atherosclerosis.  Stable post CABG change. Electronically Signed   By: Ashley Royalty M.D.   On: 10/11/2017 21:34   US Renal  Result Date: 10/12/2017 CLINICAL DATA:  Acute kidney injury. Chronic kidney disease stage 3. EXAM: RENAL / URINARY TRACT ULTRASOUND COMPLETE COMPARISON:  CT AP 06/16/2014. FINDINGS: Right Kidney: Length: 11.6 cm. Mild perinephric fluid around the inferior pole noted. Echogenicity within normal limits. No mass or hydronephrosis visualized. Left Kidney: Length: 12.2 cm. Echogenicity within normal limits. No mass or hydronephrosis visualized. Bladder: Appears normal for degree of bladder distention. Other:  Small volume of perihepatic ascites. IMPRESSION: 1. Normal cortical echogenicity of both kidneys without hydronephrosis. 2.  Mild perinephric fluid surrounds the lower pole of the right kidney, which may be related to recent acute kidney injury. Electronically Signed   By: Kerby Moors M.D.   On: 10/12/2017 12:20    Lab Data:  CBC: Recent Labs  Lab 10/27/17 0038 10/28/17 0622 10/29/17 0357 10/30/17 0634  WBC 8.5 8.1 8.2 7.5  HGB 14.7 15.5 14.1 14.6  HCT 43.9 45.4 41.1 42.5  MCV 90.7 91.9 89.0 88.9  PLT 260 274 245 101   Basic Metabolic Panel: Recent Labs  Lab 10/27/17 0038 10/28/17 0622 10/28/17 1918 10/29/17 0357 10/30/17 0634  NA 138 141  --  139 139  K 4.5 4.2  --  4.0 4.2  CL 105 105  --  106 104  CO2 25 27  --  25 28  GLUCOSE 346* 97  --  123* 143*  BUN 28* 28*  --  30* 33*  CREATININE 2.12* 1.92*  --  1.98* 2.15*  CALCIUM 9.7 9.8  --  9.5 9.7  MG  --   --  1.8 2.2  --    GFR: Estimated Creatinine Clearance: 30 mL/min (A) (by C-G formula based on SCr of 2.15 mg/dL (H)). Liver Function Tests: No results for input(s): AST, ALT, ALKPHOS, BILITOT, PROT, ALBUMIN in the last 168 hours. No results for input(s): LIPASE, AMYLASE in the last 168 hours. No results for input(s): AMMONIA in the last 168 hours. Coagulation Profile: No results for input(s): INR, PROTIME in  the last 168 hours. Cardiac Enzymes: Recent Labs  Lab 10/27/17 0347 10/27/17 0935 10/27/17 1703  TROPONINI 0.19* 0.66* 0.49*   BNP (last 3 results) No results for input(s): PROBNP in the last 8760 hours. HbA1C: No results for input(s): HGBA1C in the last 72 hours. CBG: Recent Labs  Lab 10/29/17 1153 10/29/17 1659 10/29/17 2020 10/30/17 0002 10/30/17 0737  GLUCAP 167* 206* 275* 156* 114*   Lipid Profile: No results for input(s): CHOL, HDL, LDLCALC, TRIG, CHOLHDL, LDLDIRECT in the last 72 hours. Thyroid Function Tests: No results for input(s): TSH, T4TOTAL, FREET4, T3FREE, THYROIDAB in the last 72 hours. Anemia Panel: No results for input(s): VITAMINB12, FOLATE, FERRITIN, TIBC, IRON, RETICCTPCT in the last 72 hours. Urine analysis:    Component Value Date/Time   COLORURINE YELLOW 10/12/2017 0031   APPEARANCEUR HAZY (A) 10/12/2017 0031   LABSPEC 1.021 10/12/2017 0031   PHURINE 5.0 10/12/2017 0031   GLUCOSEU NEGATIVE 10/12/2017 0031   HGBUR NEGATIVE 10/12/2017 0031   BILIRUBINUR NEGATIVE 10/12/2017 0031   KETONESUR 5 (A) 10/12/2017 0031   PROTEINUR 30 (A) 10/12/2017 0031   UROBILINOGEN 0.2 06/16/2014 1211   NITRITE NEGATIVE 10/12/2017 0031   LEUKOCYTESUR NEGATIVE 10/12/2017 0031     Aneta Hendershott M.D. [facility] 10/30/2017, 10:46 AM  Pager: [phone] Between 7am to 7pm - call Pager - [phone]  After 7pm go to [email or web address] - password [email or web address]  Call night coverage person covering after 7pm

## 2017-10-31 ENCOUNTER — Other Ambulatory Visit: Payer: Self-pay

## 2017-10-31 LAB — BASIC METABOLIC PANEL
Anion gap: 6 (ref 5–15)
BUN: 34 mg/dL — ABNORMAL HIGH (ref 6–20)
CO2: 28 mmol/L (ref 22–32)
Calcium: 9.4 mg/dL (ref 8.9–10.3)
Chloride: 105 mmol/L (ref 101–111)
Creatinine, Ser: 1.9 mg/dL — ABNORMAL HIGH (ref 0.61–1.24)
GFR calc Af Amer: 39 mL/min — ABNORMAL LOW (ref >60)
GFR calc non Af Amer: 34 mL/min — ABNORMAL LOW (ref >60)
Glucose, Bld: 122 mg/dL — ABNORMAL HIGH (ref 65–99)
Potassium: 4.2 mmol/L (ref 3.5–5.1)
Sodium: 139 mmol/L (ref 135–145)

## 2017-10-31 LAB — MAGNESIUM: Magnesium: 2 mg/dL (ref 1.7–2.4)

## 2017-10-31 LAB — GLUCOSE, CAPILLARY: Glucose-Capillary: 117 mg/dL — ABNORMAL HIGH (ref 65–99)

## 2017-10-31 MED ORDER — DOFETILIDE 125 MCG PO CAPS: 125.0000 ug | ORAL_CAPSULE | Freq: Two times a day (BID) | ORAL | 3 refills | Status: DC

## 2017-10-31 NOTE — Discharge Summary (Signed)
Physician Discharge Summary   Patient ID: Marvin Salinas MRN: [id number] DOB/AGE: Jun 11, 1946 72 y.o.  Admit date: 10/27/2017 Discharge date: 10/31/2017  Primary Care Physician:  Aletha Halim., PA-C   Recommendations for Outpatient Follow-up:  1. Follow up with PCP in 1-2 weeks 2. Please obtain BMP/CBC in one week  Home Health: None Equipment/Devices: none   Discharge Condition: stable  CODE STATUS: FULL  Diet recommendation: Heart healthy diet   Discharge Diagnoses:    . Chest pain . Tobacco use disorder . HTN (hypertension) . Atrial fibrillation with RVR ([facility]) Tobacco use Diabetes mellitus type 2 Hyperlipidemia  acute on chronic kidney disease stage III Mildly elevated troponin due to demand ischemia  Consults: Cardiology, Dr Acie Fredrickson EP cardiology    Allergies:   Allergies  Allergen Reactions  . Codeine Nausea And Vomiting  . Lisinopril Cough    Other reaction(s): Cough (ALLERGY/intolerance)  . Nsaids     Other reaction(s): Other (See Comments) CKD Other reaction(s): Other CKD  . Latex Hives     DISCHARGE MEDICATIONS: Allergies as of 10/31/2017      Reactions   Codeine Nausea And Vomiting   Lisinopril Cough   Other reaction(s): Cough (ALLERGY/intolerance)   Nsaids    Other reaction(s): Other (See Comments) CKD Other reaction(s): Other CKD   Latex Hives      Medication List    STOP taking these medications   olmesartan-hydrochlorothiazide 20-12.5 MG tablet Commonly known as:  BENICAR HCT     TAKE these medications   clopidogrel 75 MG tablet Commonly known as:  PLAVIX Take 1 tablet (75 mg total) by mouth daily.   dofetilide 125 MCG capsule Commonly known as:  TIKOSYN Take 1 capsule (125 mcg total) by mouth 2 (two) times daily.   DRY EYES OP Place 1 drop into both eyes daily. Gen teal   ezetimibe 10 MG tablet Commonly known as:  ZETIA Take 1 tablet (10 mg total) by mouth daily.   Fish Oil 1000 MG Caps Take 1,000 mg by mouth 2  (two) times daily.   fluticasone 50 MCG/ACT nasal spray Commonly known as:  FLONASE Place 1 spray into both nostrils daily as needed for allergies.   insulin glargine 100 UNIT/ML injection Commonly known as:  LANTUS Inject 35 Units into the skin 2 (two) times daily.   loratadine 10 MG tablet Commonly known as:  CLARITIN Take 10 mg by mouth daily as needed for allergies.   metoprolol tartrate 25 MG tablet Commonly known as:  LOPRESSOR Take 0.5 tablets (12.5 mg total) by mouth 2 (two) times daily.   nitroGLYCERIN 0.4 MG SL tablet Commonly known as:  NITROSTAT Place 1 tablet (0.4 mg total) under the tongue every 5 (five) minutes as needed for chest pain.   ondansetron 4 MG disintegrating tablet Commonly known as:  ZOFRAN ODT Take one tab by mouth Q6hr prn nausea.  Dissolve under tongue.   pravastatin 80 MG tablet Commonly known as:  PRAVACHOL Take 80 mg by mouth at bedtime.   Rivaroxaban 15 MG Tabs tablet Commonly known as:  XARELTO Take 1 tablet (15 mg total) by mouth daily with supper.   SINUS RINSE BOTTLE KIT NA Place 1 spray into the nose daily as needed (congrestion).   TRICOR 145 MG tablet Generic drug:  fenofibrate TAKE 1 TABLET BY MOUTH EVERY DAY        Brief H and P: For complete details please refer to admission H and P, but in brief Patient is  a 72 year old male with history of CAD status post CABG recently admitted for new onset atrial flutter and had NSTEMI, underwent cardiac cath was instructed medical management.  Patient return back with chest pain along with palpitations that started around 11 PM at home.  In ED, patient was found to be in atrial fibrillation with RVR, spontaneously converted and chest pain resolved. Troponin positive   Hospital Course:  Chest pain with underlying history of CAD status post CABG, possibly worsened due to atrial fib with RVR - Mildly elevated troponins with chest pain due to symptomatic A. fib, demand  ischemia -Converted to normal sinus rhythm.  Cardiology and EP cardiology was consulted  -Continue Xarelto, Plavix, beta-blocker, currently in normal sinus rhythm     Atrial fibrillation with RVR ([place]) -Converted to normal sinus rhythm however is symptomatic when he has RVR. - Continue Lopressor - Continue Xarelto, [place] vasc 6  -Patient was seen by EP cardiology and was recommended amiodarone Dr. Lovena Le, due to CAD and CKD.  Patient however refused to take amiodarone stating previous bad experience with amiodarone, felt photosensitivity and "almost blind". -Patient was then placed on dofetilide, creatinine improving, at baseline -QTc 432 at the time of discharge.  Essential HTN (hypertension) -BP stable, continue beta-blocker - HCTZ.  ARB discontinued per cardiology recommendations   Tobacco use disorder -Counseled on tobacco cessation  Diabetes mellitus type 2 uncontrolled with hyperglycemia -CBGs fairly stable, continue outpatient regimen, Lantus  Hyperlipidemia Continue Zetia  Acute on chronic kidney disease stage III -Baseline creatinine 1.8-2 -Creatinine 1.9, at baseline, continue to hold HCTZ, ARB    Day of Discharge S: Doing well, no acute issues, palpitations or chest pain, eager to go home.  BP 136/72 (BP Location: Right Arm)   Pulse (!) 57   Temp 97.8 F (36.6 C) (Oral)   Resp 18   Ht '5\' 8"'  (1.727 m)   Wt 82.5 kg (181 lb 14.4 oz) Comment: scale B  SpO2 98%   BMI 27.66 kg/m   Physical Exam: General: Alert and awake oriented x3 not in any acute distress. HEENT: anicteric sclera, pupils reactive to light and accommodation CVS: S1-S2 clear no murmur rubs or gallops Chest: clear to auscultation bilaterally, no wheezing rales or rhonchi Abdomen: soft nontender, nondistended, normal bowel sounds Extremities: no cyanosis, clubbing or edema noted bilaterally Neuro: Cranial nerves II-XII intact, no focal neurological deficits   The results of significant  diagnostics from this hospitalization (including imaging, microbiology, ancillary and laboratory) are listed below for reference.      Procedures/Studies:  Dg Chest 2 View  Result Date: 10/27/2017 CLINICAL DATA:  72 year old male with chest pain. EXAM: CHEST - 2 VIEW COMPARISON:  Chest radiograph dated 10/11/2017 FINDINGS: The lungs are clear. There is no pleural effusion or pneumothorax. The cardiac silhouette is within normal limits. Median sternotomy wires and CABG changes noted. No acute osseous pathology. IMPRESSION: No active cardiopulmonary disease. Electronically Signed   By: Anner Crete M.D.   On: 10/27/2017 01:54   Dg Chest 2 View  Result Date: 10/11/2017 CLINICAL DATA:  Central chest pressure and dyspnea since 6 p.m. EXAM: CHEST - 2 VIEW COMPARISON:  03/05/2011 FINDINGS: Stable cardiomegaly with aortic atherosclerosis. No aneurysm. Status post CABG. Chronic interstitial prominence without alveolar consolidation, effusion or pneumothorax. No acute osseous abnormality. IMPRESSION: 1. Chronic interstitial prominence without acute pulmonary disease. 2. Cardiomegaly with aortic atherosclerosis. Stable post CABG change. Electronically Signed   By: Ashley Royalty M.D.   On: 10/11/2017 21:34  US Renal  Result Date: 10/12/2017 CLINICAL DATA:  Acute kidney injury. Chronic kidney disease stage 3. EXAM: RENAL / URINARY TRACT ULTRASOUND COMPLETE COMPARISON:  CT AP 06/16/2014. FINDINGS: Right Kidney: Length: 11.6 cm. Mild perinephric fluid around the inferior pole noted. Echogenicity within normal limits. No mass or hydronephrosis visualized. Left Kidney: Length: 12.2 cm. Echogenicity within normal limits. No mass or hydronephrosis visualized. Bladder: Appears normal for degree of bladder distention. Other:  Small volume of perihepatic ascites. IMPRESSION: 1. Normal cortical echogenicity of both kidneys without hydronephrosis. 2. Mild perinephric fluid surrounds the lower pole of the right kidney,  which may be related to recent acute kidney injury. Electronically Signed   By: Kerby Moors M.D.   On: 10/12/2017 12:20       LAB RESULTS: Basic Metabolic Panel: Recent Labs  Lab 10/30/17 0634 10/31/17 0658  NA 139 139  K 4.2 4.2  CL 104 105  CO2 28 28  GLUCOSE 143* 122*  BUN 33* 34*  CREATININE 2.15* 1.90*  CALCIUM 9.7 9.4  MG  --  2.0   Liver Function Tests: No results for input(s): AST, ALT, ALKPHOS, BILITOT, PROT, ALBUMIN in the last 168 hours. No results for input(s): LIPASE, AMYLASE in the last 168 hours. No results for input(s): AMMONIA in the last 168 hours. CBC: Recent Labs  Lab 10/29/17 0357 10/30/17 0634  WBC 8.2 7.5  HGB 14.1 14.6  HCT 41.1 42.5  MCV 89.0 88.9  PLT 245 248   Cardiac Enzymes: Recent Labs  Lab 10/27/17 0935 10/27/17 1703  TROPONINI 0.66* 0.49*   BNP: Invalid input(s): POCBNP CBG: Recent Labs  Lab 10/30/17 2125 10/31/17 0745  GLUCAP 228* 117*      Disposition and Follow-up: Discharge Instructions    Diet - low sodium heart healthy   Complete by:  As directed    Increase activity slowly   Complete by:  As directed        DISPOSITION: Home   DISCHARGE FOLLOW-UP Follow-up Information    Aletha Halim., PA-C. Schedule an appointment as soon as possible for a visit in 2 week(s).   Specialty:  Family Medicine Contact information: [address] [place] [address] [phone]        [name], Wonda Cheng, MD. Schedule an appointment as soon as possible for a visit in 2 week(s).   Specialty:  Cardiology Contact information: [address] [place] [address] [place] Follow up on 11/07/2017.   Specialty:  Cardiology Why:  at [facility] information: [address] [id number] [place] [phone]           Time coordinating discharge:  35 minutes  Signed:   Estill Cotta M.D. [facility] 10/31/2017, 10:34 AM Pager: [phone]

## 2017-10-31 NOTE — Progress Notes (Signed)
Pt given avs, all instructions reviewed and questions answered , pt was given 1 week supply of tikosyn ordered by [facility], pt discharge in stable condition, pt refused wheelchair and ambulated in stable condtion with wife at bedside

## 2017-10-31 NOTE — Care Management Note (Signed)
Case Management Note  Patient Details  Name: Marvin Salinas MRN: [id number] Date of Birth: 1945-12-28  Subjective/Objective:  Pt presented for Tikosyn Load-Benefits Check (CM called to [place] and co pay will be $3.40. Medication is not in stock and will be ordered.                   Action/Plan: CM did send the Rx down for 7 day supply no refills. No further needs from CM at this time.   Expected Discharge Date:                  Expected Discharge Plan:  Home/Self Care  In-House Referral:  NA  Discharge planning Services  CM Consult, Medication Assistance  Post Acute Care Choice:  NA Choice offered to:  NA  DME Arranged:  N/A DME Agency:  NA  HH Arranged:  NA HH Agency:  NA  Status of Service:  Completed, signed off  If discussed at [place] of Stay Meetings, dates discussed:    Additional Comments:  Bethena Roys, RN 10/31/2017, 10:11 AM

## 2017-10-31 NOTE — Progress Notes (Addendum)
Labs and EKG stable today 2 hours post Tikosyn dose. Ok from our standpoint to discharge to home. Rx for 1 week supply of Tikosyn sent to floor.  Follow up appointments made.  Chanetta Marshall, NP 10/31/2017 9:26 AM  Allegra Lai, MD

## 2017-10-31 NOTE — Progress Notes (Signed)
Paged Hassan Rowan CM to advise of tikosyn 1 week supply at desk, she inquired as it has only been 2 days, I advised Dr Curt Bears has been in to see pt and per note that today is last day, per EP note ok to dc home with labs and ekg, she will be down to get rx for sample

## 2017-10-31 NOTE — Progress Notes (Signed)
Progress Note  Patient Name: Marvin Salinas Date of Encounter: 10/31/2017  Primary Cardiologist: Mertie Moores, MD   Subjective   Feeling well, tolerating tikosyn without issue.  Inpatient Medications    Scheduled Meds: . clopidogrel  75 mg Oral Daily  . dofetilide  125 mcg Oral BID  . ezetimibe  10 mg Oral Daily  . fenofibrate  160 mg Oral Daily  . insulin aspart  0-5 Units Subcutaneous QHS  . insulin aspart  0-9 Units Subcutaneous TID WC  . insulin glargine  35 Units Subcutaneous BID  . metoprolol tartrate  12.5 mg Oral BID  . omega-3 acid ethyl esters  2 g Oral Daily  . pravastatin  80 mg Oral QHS  . rivaroxaban  15 mg Oral Q supper   Continuous Infusions: . sodium chloride 75 mL/hr at 10/31/17 0339   PRN Meds: acetaminophen, fluticasone, loratadine, nitroGLYCERIN, ondansetron (ZOFRAN) IV   Vital Signs    Vitals:   10/30/17 1946 10/30/17 2304 10/31/17 0400 10/31/17 0425  BP: 131/62   136/72  Pulse: (!) 58 72  (!) 57  Resp: 18     Temp: 97.8 F (36.6 C)   97.8 F (36.6 C)  TempSrc: Oral   Oral  SpO2: 95%   98%  Weight:   181 lb 14.4 oz (82.5 kg)   Height:        Intake/Output Summary (Last 24 hours) at 10/31/2017 0726 Last data filed at 10/31/2017 0300 Gross per 24 hour  Intake 1931.25 ml  Output 1131 ml  Net 800.25 ml   Filed Weights   10/29/17 0411 10/30/17 0416 10/31/17 0400  Weight: 179 lb 9.6 oz (81.5 kg) 178 lb 12.8 oz (81.1 kg) 181 lb 14.4 oz (82.5 kg)    Telemetry    Sinus rhythm - personally reviewed  ECG    Sinus rhythm - personally reviewed  Physical Exam   GEN: Well nourished, well developed, in no acute distress  HEENT: normal  Neck: no JVD, carotid bruits, or masses Cardiac: RRR; no murmurs, rubs, or gallops,no edema  Respiratory:  clear to auscultation bilaterally, normal work of breathing GI: soft, nontender, nondistended, + BS MS: no deformity or atrophy  Skin: warm and dry Neuro:  Strength and sensation are  intact Psych: euthymic mood, full affect   Labs    Chemistry Recent Labs  Lab 10/28/17 0622 10/29/17 0357 10/30/17 0634  NA 141 139 139  K 4.2 4.0 4.2  CL 105 106 104  CO2 27 25 28   GLUCOSE 97 123* 143*  BUN 28* 30* 33*  CREATININE 1.92* 1.98* 2.15*  CALCIUM 9.8 9.5 9.7  GFRNONAA 33* 32* 29*  GFRAA 39* 37* 34*  ANIONGAP 9 8 7      Hematology Recent Labs  Lab 10/28/17 0622 10/29/17 0357 10/30/17 0634  WBC 8.1 8.2 7.5  RBC 4.94 4.62 4.78  HGB 15.5 14.1 14.6  HCT 45.4 41.1 42.5  MCV 91.9 89.0 88.9  MCH 31.4 30.5 30.5  MCHC 34.1 34.3 34.4  RDW 13.8 13.3 13.2  PLT 274 245 248    Cardiac Enzymes Recent Labs  Lab 10/27/17 0347 10/27/17 0935 10/27/17 1703  TROPONINI 0.19* 0.66* 0.49*    Recent Labs  Lab 10/27/17 0044  TROPIPOC 0.02     BNPNo results for input(s): BNP, PROBNP in the last 168 hours.   DDimer No results for input(s): DDIMER in the last 168 hours.   Radiology    Dg Chest 2 View Result Date:  10/27/2017 CLINICAL DATA:  72 year old male with chest pain. EXAM: CHEST - 2 VIEW COMPARISON:  Chest radiograph dated 10/11/2017 FINDINGS: The lungs are clear. There is no pleural effusion or pneumothorax. The cardiac silhouette is within normal limits. Median sternotomy wires and CABG changes noted. No acute osseous pathology. IMPRESSION: No active cardiopulmonary disease. Electronically Signed   By: Anner Crete M.D.   On: 10/27/2017 01:54    Cardiac Studies   10/13/17: [facility] Conclusions: 1. Severe 3-vessel coronary artery disease with chronic total occlusions of the proximal/mid LAD, mid/distal LCx, and proximal RCA. 2. Moderate caliber D1 and distal rPDA demonstrate 70-80% stenoses and are not revascularized. 3. Widely patent LIMA-LAD, SVG-OM2, and SVG-rPDA. 4. Moderately elevated left ventricular filling pressure (LVEDP 28 mmHg).  Recommendations: 1. Optimize medical therapy. OM1 and mid/distal LCx occlusions are new since 2011 but involve  relatively small territories. Additionally, faint collaterals are present. I suspect chest pain was due to demand ischemia in the setting of atrial flutter, though troponin elevation seems a bit excessive for supply-demand mismatch. 2. If Mr. Jenniges has recurrent chest pain despite aggressive medical therapy, PCI to D1 could be considered in the future. Distal rPDA stenosis is too small/distal for percutaneous intervention. 3. No further IV fluids at this time. Consider gentle diuresis beginning tomorrow as renal function allows, given moderately elevated LVEDP. 4. Restart heparin in 4 hours. Given recent atrial flutter, Mr. Southers would benefit from anticoagulation in addition to antiplatelet therapy (favor NOAC + clopidogrel or ASA).  10/12/17: TTE Study Conclusions - Left ventricle: The cavity size was normal. Wall thickness was increased in a pattern of mild LVH. Systolic function was normal. The estimated ejection fraction was in the range of 55% to 60%. Wall motion was normal; there were no regional wall motion abnormalities. Left ventricular diastolic function parameters were normal. Impressions: - Normal LV systolic function; mild diastolic dysfunction; mild LVH.   Patient Profile     72 y.o. male with a hx of CAD (CABG), had a recent admission for new onset atrial flutter and NSTEMI with cath showing severe, native, 3 vessel CAD and 3/3 patent grafts but new OM1 and mid/distal LCx occlusions, treated medically, discharged in [place] after spontaneous conversion, HTN, DM, HLD, CKD (III), smoker, TIA, peripheral vascular disease b/l LE, admitted with sudden onset of chest heaviness and racing heart, making him feel weak, "awful".  He stated while in the ER, he stood to use the BR and could tell immediately that his HR went back to normal.  He had a recent admission for new onset rapid atrial flutter and NSTEMI with cath showing severe, native, 3 vessel CAD and 3/3 patent  grafts but new OM1 and mid/distal LCx occlusions, treated medically, during that stay he was stared on [facility] for a/c, discharged in [place].  Assessment & Plan      Coronary artery disease  Normal LV function  Renal insufficiency grade 4  Atrial flutter-atypical  Currently on low dose tikosyn due to kidney issues and tolerating well. QTc remains acceptable. Likely last dose today. ECG pending. Keep K>4 and Mg >2. Tallia Moehring follow up on ECG and labs from today.

## 2017-11-05 ENCOUNTER — Other Ambulatory Visit: Payer: Self-pay | Admitting: Cardiovascular Disease

## 2017-11-07 ENCOUNTER — Encounter (HOSPITAL_COMMUNITY): Payer: Medicare Other | Admitting: Nurse Practitioner

## 2017-11-07 ENCOUNTER — Ambulatory Visit (HOSPITAL_COMMUNITY)
Admission: RE | Admit: 2017-11-07 | Discharge: 2017-11-07 | Disposition: A | Discharge status: Home / Self Care | Payer: Medicare Other | Source: Ambulatory Visit | Attending: Cardiovascular Disease | Admitting: Cardiovascular Disease

## 2017-11-07 DIAGNOSIS — I739 Peripheral vascular disease, unspecified: Secondary | ICD-10-CM | POA: Insufficient documentation

## 2017-11-08 ENCOUNTER — Encounter (HOSPITAL_COMMUNITY): Payer: Self-pay | Admitting: Nurse Practitioner

## 2017-11-08 ENCOUNTER — Ambulatory Visit (HOSPITAL_COMMUNITY)
Admission: RE | Admit: 2017-11-08 | Discharge: 2017-11-08 | Disposition: A | Discharge status: Home / Self Care | Payer: Medicare Other | Source: Ambulatory Visit | Attending: Nurse Practitioner | Admitting: Nurse Practitioner

## 2017-11-08 ENCOUNTER — Telehealth (HOSPITAL_COMMUNITY): Payer: Self-pay | Admitting: *Deleted

## 2017-11-08 VITALS — BP 128/64 | HR 57 | Ht 68.0 in | Wt 187.4 lb

## 2017-11-08 DIAGNOSIS — Z91040 Latex allergy status: Secondary | ICD-10-CM | POA: Insufficient documentation

## 2017-11-08 DIAGNOSIS — Z825 Family history of asthma and other chronic lower respiratory diseases: Secondary | ICD-10-CM | POA: Diagnosis not present

## 2017-11-08 DIAGNOSIS — Z886 Allergy status to analgesic agent status: Secondary | ICD-10-CM | POA: Diagnosis not present

## 2017-11-08 DIAGNOSIS — Z794 Long term (current) use of insulin: Secondary | ICD-10-CM | POA: Insufficient documentation

## 2017-11-08 DIAGNOSIS — Z87891 Personal history of nicotine dependence: Secondary | ICD-10-CM | POA: Insufficient documentation

## 2017-11-08 DIAGNOSIS — E1140 Type 2 diabetes mellitus with diabetic neuropathy, unspecified: Secondary | ICD-10-CM | POA: Insufficient documentation

## 2017-11-08 DIAGNOSIS — Z951 Presence of aortocoronary bypass graft: Secondary | ICD-10-CM | POA: Insufficient documentation

## 2017-11-08 DIAGNOSIS — F329 Major depressive disorder, single episode, unspecified: Secondary | ICD-10-CM | POA: Diagnosis not present

## 2017-11-08 DIAGNOSIS — Z8673 Personal history of transient ischemic attack (TIA), and cerebral infarction without residual deficits: Secondary | ICD-10-CM | POA: Diagnosis not present

## 2017-11-08 DIAGNOSIS — Z885 Allergy status to narcotic agent status: Secondary | ICD-10-CM | POA: Insufficient documentation

## 2017-11-08 DIAGNOSIS — I2510 Atherosclerotic heart disease of native coronary artery without angina pectoris: Secondary | ICD-10-CM | POA: Insufficient documentation

## 2017-11-08 DIAGNOSIS — I10 Essential (primary) hypertension: Secondary | ICD-10-CM | POA: Insufficient documentation

## 2017-11-08 DIAGNOSIS — I480 Paroxysmal atrial fibrillation: Secondary | ICD-10-CM | POA: Insufficient documentation

## 2017-11-08 DIAGNOSIS — I4892 Unspecified atrial flutter: Secondary | ICD-10-CM | POA: Diagnosis present

## 2017-11-08 DIAGNOSIS — Z7902 Long term (current) use of antithrombotics/antiplatelets: Secondary | ICD-10-CM | POA: Insufficient documentation

## 2017-11-08 DIAGNOSIS — Z8249 Family history of ischemic heart disease and other diseases of the circulatory system: Secondary | ICD-10-CM | POA: Insufficient documentation

## 2017-11-08 DIAGNOSIS — G4730 Sleep apnea, unspecified: Secondary | ICD-10-CM | POA: Insufficient documentation

## 2017-11-08 DIAGNOSIS — Z955 Presence of coronary angioplasty implant and graft: Secondary | ICD-10-CM | POA: Diagnosis not present

## 2017-11-08 DIAGNOSIS — Z87442 Personal history of urinary calculi: Secondary | ICD-10-CM | POA: Diagnosis not present

## 2017-11-08 DIAGNOSIS — Z79899 Other long term (current) drug therapy: Secondary | ICD-10-CM | POA: Diagnosis not present

## 2017-11-08 DIAGNOSIS — Z888 Allergy status to other drugs, medicaments and biological substances status: Secondary | ICD-10-CM | POA: Insufficient documentation

## 2017-11-08 DIAGNOSIS — E785 Hyperlipidemia, unspecified: Secondary | ICD-10-CM | POA: Insufficient documentation

## 2017-11-08 DIAGNOSIS — Z7901 Long term (current) use of anticoagulants: Secondary | ICD-10-CM | POA: Diagnosis not present

## 2017-11-08 LAB — HEPATIC FUNCTION PANEL
ALT: 21 (ref 10–40)
AST: 20 (ref 14–40)

## 2017-11-08 LAB — BASIC METABOLIC PANEL
Anion gap: 9 (ref 5–15)
BUN: 21 mg/dL — ABNORMAL HIGH (ref 6–20)
BUN: 23 — AB (ref 4–21)
CO2: 24 mmol/L (ref 22–32)
Calcium: 9.5 mg/dL (ref 8.9–10.3)
Chloride: 108 mmol/L (ref 101–111)
Creatinine, Ser: 1.87 mg/dL — ABNORMAL HIGH (ref 0.61–1.24)
Creatinine: 1.8 — AB (ref 0.6–1.3)
GFR calc Af Amer: 40 mL/min — ABNORMAL LOW (ref >60)
GFR calc non Af Amer: 34 mL/min — ABNORMAL LOW (ref >60)
Glucose, Bld: 195 mg/dL — ABNORMAL HIGH (ref 65–99)
Potassium: 4.4 mmol/L (ref 3.5–5.1)
Sodium: 139 (ref 137–147)
Sodium: 141 mmol/L (ref 135–145)

## 2017-11-08 LAB — CBC AND DIFFERENTIAL
HCT: 41 (ref 41–53)
Hemoglobin: 13.8 (ref 13.5–17.5)
Platelets: 278 (ref 150–399)
WBC: 8.1

## 2017-11-08 LAB — MAGNESIUM: Magnesium: 1.8 mg/dL (ref 1.7–2.4)

## 2017-11-08 NOTE — Progress Notes (Signed)
Primary Care Physician: Aletha Halim PA-C Referring Physician: [facility] ER f/u   Marvin Salinas is a 72 y.o. male with a h/o CAD status post CABG recently admitted for new onset atrial flutter and had NSTEMI, underwent cardiac cath was deemed for  medical management. Patient returned soon after d/c with chest pain along with palpitations that started around 11 PM at home. In ED, patient was found to be in atrial fibrillation with RVR, spontaneously converted and chest pain resolved. Troponin + thought to be 2/2 to  symptomatic afib/ demand ischemia. EP saw the pt and initially  recommended amiodarone but the pt could not tolerate when used at the time of bypass. He was then loaded on Tikosyn, dose 125 mcg bid 2/2 CKD. His qt remained stable.HCTZ was discontinued.   He is in afib clinic for f/u. He is staying in [place]. Has noted some swelling of his lower extremities off HCTZ and ARB, bioth stopped in hospital.His current weight is still in his usual reported range. Zofran is still on his list, but was told not to take and go home and throw med away. He was reminded that he cannot restart HCTZ.  Today, he denies symptoms of palpitations, chest pain, shortness of breath, orthopnea, PND, lower extremity edema, dizziness, presyncope, syncope, or neurologic sequela. The patient is tolerating medications without difficulties and is otherwise without complaint today.   Past Medical History:  Diagnosis Date  . Atrial fibrillation with RVR ([place]) 10/27/2017  . Back pain   . Basilar artery stenosis    on chronic Plavix  . Cerebral vascular disease    with prior TIA's; followed by Dr. Erling Cruz  . Depression   . Diabetes mellitus    on insulin  . History of kidney stones   . History of renal calculi   . Hyperlipidemia   . Hypertension   . Ischemic heart disease    prior PCI to RCA in 1989. S/P PCI to LAD and OM in 1992. S/P PCI to first DX in 2000. S/P CABG x 3 in May 2011  . Peripheral neuropathy     . Pneumonia Sept 2012  . Sleep apnea    Past Surgical History:  Procedure Laterality Date  . ANGIOPLASTY  1989   right coronary artery  . ANGIOPLASTY  1992   LAD and OM  . ANGIOPLASTY  1998   First DX  . CORONARY ARTERY BYPASS GRAFT  11/12/2009   LIMA to LAD, SVG to OM and SVG to RCA  . CORONARY STENT PLACEMENT  2000   Stent to LAD/Circumflex with angioplasty to first diagonal   . LEFT HEART CATH AND CORS/GRAFTS ANGIOGRAPHY N/A 10/13/2017   Procedure: LEFT HEART CATH AND CORS/GRAFTS ANGIOGRAPHY;  Surgeon: Nelva Bush, MD;  Location: [facility];  Service: Cardiovascular;  Laterality: N/A;    Current Outpatient Medications  Medication Sig Dispense Refill  . Artificial Tear Ointment (DRY EYES OP) Place 1 drop into both eyes daily. Gen teal    . clopidogrel (PLAVIX) 75 MG tablet Take 1 tablet (75 mg total) by mouth daily. 90 tablet 3  . dofetilide (TIKOSYN) 125 MCG capsule Take 1 capsule (125 mcg total) by mouth 2 (two) times daily. 60 capsule 3  . ezetimibe (ZETIA) 10 MG tablet Take 1 tablet (10 mg total) by mouth daily. 30 tablet 3  . fluticasone (FLONASE) 50 MCG/ACT nasal spray Place 1 spray into both nostrils daily as needed for allergies.     Marland Kitchen  Hypertonic Nasal Wash (SINUS RINSE BOTTLE KIT NA) Place 1 spray into the nose daily as needed (congrestion).     . insulin glargine (LANTUS) 100 UNIT/ML injection Inject 35 Units into the skin 2 (two) times daily.     Marland Kitchen loratadine (CLARITIN) 10 MG tablet Take 10 mg by mouth daily as needed for allergies.     . metoprolol tartrate (LOPRESSOR) 25 MG tablet Take 0.5 tablets (12.5 mg total) by mouth 2 (two) times daily. 180 tablet 3  . nitroGLYCERIN (NITROSTAT) 0.4 MG SL tablet Place 1 tablet (0.4 mg total) under the tongue every 5 (five) minutes as needed for chest pain. 30 tablet 12  . olmesartan (BENICAR) 20 MG tablet 1 tablet daily.    . Omega-3 Fatty Acids (FISH OIL) 1000 MG CAPS Take 1,000 mg by mouth 2 (two) times daily.    .  pravastatin (PRAVACHOL) 80 MG tablet Take 80 mg by mouth at bedtime.     . Rivaroxaban (XARELTO) 15 MG TABS tablet Take 1 tablet (15 mg total) by mouth daily with supper. 30 tablet 2  . TRICOR 145 MG tablet TAKE 1 TABLET BY MOUTH EVERY DAY 90 tablet 1   No current facility-administered medications for this encounter.     Allergies  Allergen Reactions  . Codeine Nausea And Vomiting  . Lisinopril Cough    Other reaction(s): Cough (ALLERGY/intolerance)  . Nsaids     Other reaction(s): Other (See Comments) CKD Other reaction(s): Other CKD  . Latex Hives    Social History   Socioeconomic History  . Marital status: Divorced    Spouse name: Not on file  . Number of children: 0  . Years of education: GED  . Highest education level: Not on file  Occupational History  . Occupation: retired  Scientific laboratory technician  . Financial resource strain: Not on file  . Food insecurity:    Worry: Not on file    Inability: Not on file  . Transportation needs:    Medical: Not on file    Non-medical: Not on file  Tobacco Use  . Smoking status: Former Smoker    Packs/day: 1.00    Years: 54.00    Pack years: 54.00    Types: Cigarettes    Last attempt to quit: 10/19/2009    Years since quitting: 8.0  . Smokeless tobacco: Never Used  Substance and Sexual Activity  . Alcohol use: No  . Drug use: No  . Sexual activity: Not Currently  Lifestyle  . Physical activity:    Days per week: Not on file    Minutes per session: Not on file  . Stress: Not on file  Relationships  . Social connections:    Talks on phone: Not on file    Gets together: Not on file    Attends religious service: Not on file    Active member of club or organization: Not on file    Attends meetings of clubs or organizations: Not on file    Relationship status: Not on file  . Intimate partner violence:    Fear of current or ex partner: Not on file    Emotionally abused: Not on file    Physically abused: Not on file    Forced  sexual activity: Not on file  Other Topics Concern  . Not on file  Social History Narrative   Patient is Divorced.   Patient has his GED.   Patient is right handed.   Patient does not drink any  caffeine.    Family History  Problem Relation Age of Onset  . Hypertension Father   . Heart disease Father   . Heart attack Father   . Asthma Brother   . Stroke Unknown        Uncle    ROS- All systems are reviewed and negative except as per the HPI above  Physical Exam: Vitals:   11/08/17 1342  BP: 128/64  Pulse: (!) 57  Weight: 187 lb 6.4 oz (85 kg)  Height: '5\' 8"'  (1.727 m)   Wt Readings from Last 3 Encounters:  11/08/17 187 lb 6.4 oz (85 kg)  10/31/17 181 lb 14.4 oz (82.5 kg)  10/26/17 186 lb 4 oz (84.5 kg)    Labs: Lab Results  Component Value Date   NA 141 11/08/2017   K 4.4 11/08/2017   CL 108 11/08/2017   CO2 24 11/08/2017   GLUCOSE 195 (H) 11/08/2017   BUN 21 (H) 11/08/2017   CREATININE 1.87 (H) 11/08/2017   CALCIUM 9.5 11/08/2017   MG 1.8 11/08/2017   Lab Results  Component Value Date   INR 1.40 11/12/2009   Lab Results  Component Value Date   CHOL 171 10/12/2017   HDL 42 10/12/2017   LDLCALC 111 (H) 10/12/2017   TRIG 92 10/12/2017     GEN- The patient is well appearing, alert and oriented x 3 today.   Head- normocephalic, atraumatic Eyes-  Sclera clear, conjunctiva pink Ears- hearing intact Oropharynx- clear Neck- supple, no JVP Lymph- no cervical lymphadenopathy Lungs- Clear to ausculation bilaterally, normal work of breathing Heart- Regular rate and rhythm, no murmurs, rubs or gallops, PMI not laterally displaced GI- soft, NT, ND, + BS Extremities- no clubbing, cyanosis, or edema MS- no significant deformity or atrophy Skin- no rash or lesion Psych- euthymic mood, full affect Neuro- strength and sensation are intact  EKG-Sinus brady at 57 bpm, pr int 144 nms, qrs int 84 ms, qtc 416 ms(stable) Epic records reviewed    Assessment and  Plan: 1. Paroxysmal symptomatic afib General precautions re Phyllis Ginger Reminded not to take either HCTZ or Zofran and if he still has at home to throw away Monitor LLE and if worsens contact Dr. Acie Fredrickson Pt had f/u with Dr. Acie Fredrickson when he was in  the hospital but this was cancelled Will request f/u with him in June  Continue xarelto for a chadsvasc score of at least 6  2. HTN Stable   3. CAD No further chest pain Continue statin, BB, plavix  Butch Penny C. Bertice Risse, [facility] [address] [place], [place] [address] [phone]

## 2017-11-08 NOTE — Telephone Encounter (Signed)
I cld pt to inform that he should no longer take the zofran on his medication list as it is contraindicated with the tikosyn.  Pt also advised that the HCTZ was discontinued in the hospital because it also cannot be taken with tikosyn and if he should notice anymore swelling he should contact Dr. Acie Fredrickson for his recommendations.  Pt told labs are stable.  Pt understood

## 2017-11-12 ENCOUNTER — Other Ambulatory Visit: Payer: Self-pay | Admitting: Cardiovascular Disease

## 2017-11-18 ENCOUNTER — Encounter: Payer: Self-pay | Admitting: Physician Assistant

## 2017-11-18 ENCOUNTER — Encounter (INDEPENDENT_AMBULATORY_CARE_PROVIDER_SITE_OTHER): Payer: Self-pay

## 2017-11-18 ENCOUNTER — Ambulatory Visit: Payer: Medicare Other | Admitting: Physician Assistant

## 2017-11-18 VITALS — BP 122/60 | HR 51 | Ht 68.0 in | Wt 186.0 lb

## 2017-11-18 DIAGNOSIS — I10 Essential (primary) hypertension: Secondary | ICD-10-CM

## 2017-11-18 DIAGNOSIS — I2510 Atherosclerotic heart disease of native coronary artery without angina pectoris: Secondary | ICD-10-CM | POA: Diagnosis not present

## 2017-11-18 DIAGNOSIS — N183 Chronic kidney disease, stage 3 unspecified: Secondary | ICD-10-CM

## 2017-11-18 DIAGNOSIS — I739 Peripheral vascular disease, unspecified: Secondary | ICD-10-CM | POA: Diagnosis not present

## 2017-11-18 DIAGNOSIS — I481 Persistent atrial fibrillation: Secondary | ICD-10-CM | POA: Diagnosis not present

## 2017-11-18 DIAGNOSIS — E785 Hyperlipidemia, unspecified: Secondary | ICD-10-CM | POA: Diagnosis not present

## 2017-11-18 DIAGNOSIS — I4819 Other persistent atrial fibrillation: Secondary | ICD-10-CM

## 2017-11-18 HISTORY — DX: Peripheral vascular disease, unspecified: I73.9

## 2017-11-18 NOTE — Progress Notes (Signed)
Cardiology Office Note:    Date:  11/18/2017   ID:  KYO COCUZZA, DOB 02-May-1946, MRN [id number]  PCP:  Aletha Halim., PA-C  Cardiologist:  Mertie Moores, MD  PV:  Kathlyn Sacramento, MD  Referring MD: Aletha Halim., PA-C   Chief Complaint  Patient presents with  . Hospitalization Follow-up    Atrial fibrillation    History of Present Illness:    Marvin Salinas is a 72 y.o. male with coronary artery disease status post CABG in 2011, diabetes, hypertension, hyperlipidemia, chronic kidney disease, prior TIAs, known basilar artery stenosis,, peripheral arterial disease, tobacco abuse.  He was admitted in April 2019 with non-ST elevation myocardial infarction in the setting of atrial flutter with rapid ventricular rate.  He converted spontaneously to normal sinus rhythm.  Cardiac catheterization demonstrated patent bypass grafts.  Troponin elevation was felt to be related to demand ischemia in the setting of atrial flutter.  There was moderate caliber D1 and distal RPDA stenosis.  PCI could be considered of the D1 if the patient failed optimal medical therapy.  The distal RPDA was too small for PCI.  He is on Rivaroxaban for anticoagulation.  He was last seen in the office 10/26/2017 by Daune Perch, NP.    He was readmitted 5/9-5/13 with unstable angina associated with recurrent atrial fibrillation/flutter with rapid ventricular rate.  Troponin levels were mildly elevated.  This was felt to be related to demand ischemia.  He converted spontaneously to normal sinus rhythm.  He was seen by cardiology and electrophysiology.  Initially amiodarone was recommended.  However, the patient had previous side effects (sun sensitivity).  Therefore, he was placed on low-dose dofetilide.  He has followed up in the [facility] 11/08/2017.  CHADS2-VASc=6 (DM, HTN, CAD, TIA x 2, age x 1).     Marvin Salinas returns for posthospitalization follow-up.  He is here alone.  He denies any recurrent  chest pain or shortness of breath or palpitations.  He denies syncope, PND.  He has mild pedal edema after prolonged standing.  He did have an episode of dizziness recently that remind him of his prior TIA from several years ago.  Prior CV studies:   The following studies were reviewed today:  ABIs 11/07/2017 Final Interpretation: Right: Resting right ankle-brachial index is within normal range. No evidence of significant right lower extremity arterial disease. The right toe-brachial index is abnormal. Left: Resting left ankle-brachial index indicates mild left lower extremity arterial disease. The left toe-brachial index is abnormal.  Cardiac catheterization 10/13/2017 LM distal 50 LAD proximal 100-CTO, mid to distal 50; D1 ostial 75 LCx mid 100; OM1 100; OM2 95 RCA proximal 100-CTO; RPDA 70 SVG-RPDA patent SVG-OM 2 patent LIMA-LAD patent   Echo 10/12/2017 Mild LVH, EF 55-60, normal wall motion  Carotid US 03/16/2017 Bilateral ICA 1-39 Follow-up 1 year   Past Medical History:  Diagnosis Date  . Atrial fibrillation with RVR ([place]) 10/27/2017  . Back pain   . Basilar artery stenosis    on chronic Plavix  . Cerebral vascular disease    with prior TIA's; followed by Dr. Erling Cruz  . Depression   . Diabetes mellitus    on insulin  . History of kidney stones   . History of renal calculi   . Hyperlipidemia   . Hypertension   . Ischemic heart disease    prior PCI to RCA in 1989. S/P PCI to LAD and OM in 1992. S/P PCI to first DX in  2000. S/P CABG x 3 in May 2011  . Peripheral neuropathy   . Pneumonia Sept 2012  . Sleep apnea    Surgical Hx: The patient  has a past surgical history that includes Angioplasty (1989); Angioplasty (1992); Angioplasty (1998); Coronary stent placement (2000); LEFT HEART CATH AND CORS/GRAFTS ANGIOGRAPHY (N/A, 10/13/2017); and Coronary artery bypass graft (11/12/2009).   Current Medications: Current Meds  Medication Sig  . Artificial Tear Ointment (DRY  EYES OP) Place 1 drop into both eyes daily. Gen teal  . clopidogrel (PLAVIX) 75 MG tablet TAKE 1 TABLET BY MOUTH EVERY DAY  . dofetilide (TIKOSYN) 125 MCG capsule Take 1 capsule (125 mcg total) by mouth 2 (two) times daily.  Marland Kitchen ezetimibe (ZETIA) 10 MG tablet Take 1 tablet (10 mg total) by mouth daily.  . fluticasone (FLONASE) 50 MCG/ACT nasal spray Place 1 spray into both nostrils daily as needed for allergies.   . Hypertonic Nasal Wash (SINUS RINSE BOTTLE KIT NA) Place 1 spray into the nose daily as needed (congrestion).   . insulin glargine (LANTUS) 100 UNIT/ML injection Inject 35 Units into the skin 2 (two) times daily.   Marland Kitchen loratadine (CLARITIN) 10 MG tablet Take 10 mg by mouth daily as needed for allergies.   . metoprolol tartrate (LOPRESSOR) 25 MG tablet Take 0.5 tablets (12.5 mg total) by mouth 2 (two) times daily.  . nitroGLYCERIN (NITROSTAT) 0.4 MG SL tablet Place 1 tablet (0.4 mg total) under the tongue every 5 (five) minutes as needed for chest pain.  Marland Kitchen olmesartan (BENICAR) 20 MG tablet 1 tablet daily.  . Omega-3 Fatty Acids (FISH OIL) 1000 MG CAPS Take 1,000 mg by mouth 2 (two) times daily.  . pravastatin (PRAVACHOL) 80 MG tablet Take 80 mg by mouth at bedtime.   . Rivaroxaban (XARELTO) 15 MG TABS tablet Take 1 tablet (15 mg total) by mouth daily with supper.  . TRICOR 145 MG tablet TAKE 1 TABLET BY MOUTH EVERY DAY     Allergies:   Codeine; Lisinopril; Nsaids; and Latex   Social History   Tobacco Use  . Smoking status: Former Smoker    Packs/day: 1.00    Years: 54.00    Pack years: 54.00    Types: Cigarettes    Last attempt to quit: 10/19/2009    Years since quitting: 8.0  . Smokeless tobacco: Never Used  Substance Use Topics  . Alcohol use: No  . Drug use: No     Family Hx: The patient's family history includes Asthma in his brother; Heart attack in his father; Heart disease in his father; Hypertension in his father; Stroke in his unknown relative.  ROS:   Please see  the history of present illness.    Review of Systems  Respiratory: Positive for snoring.   Hematologic/Lymphatic: Bruises/bleeds easily.  Musculoskeletal: Positive for back pain, joint pain and myalgias.  Neurological: Positive for dizziness.   All other systems reviewed and are negative.   EKGs/Labs/Other Test Reviewed:    EKG:  EKG is  ordered today.  The ekg ordered today demonstrates sinus bradycardia, heart rate 51, inferior Q waves, T wave inversions 1, aVL, V4-V6, QTC 423, similar to prior tracing dated 11/08/2017  Recent Labs: 10/11/2017: ALT 35; B Natriuretic Peptide 140.6 10/27/2017: TSH 0.903 10/30/2017: Hemoglobin 14.6; Platelets 248 11/08/2017: BUN 21; Creatinine, Ser 1.87; Magnesium 1.8; Potassium 4.4; Sodium 141   Recent Lipid Panel Lab Results  Component Value Date/Time   CHOL 171 10/12/2017 04:11 AM   TRIG 92  10/12/2017 04:11 AM   HDL 42 10/12/2017 04:11 AM   CHOLHDL 4.1 10/12/2017 04:11 AM   LDLCALC 111 (H) 10/12/2017 04:11 AM    Physical Exam:    VS:  BP 122/60   Pulse (!) 51   Ht '5\' 8"'  (1.727 m)   Wt 186 lb (84.4 kg)   BMI 28.28 kg/m     Wt Readings from Last 3 Encounters:  11/18/17 186 lb (84.4 kg)  11/08/17 187 lb 6.4 oz (85 kg)  10/31/17 181 lb 14.4 oz (82.5 kg)     Physical Exam  Constitutional: He is oriented to person, place, and time. He appears well-developed and well-nourished. No distress.  HENT:  Head: Normocephalic and atraumatic.  Neck: Neck supple. No JVD present.  Cardiovascular: Normal rate, regular rhythm, S1 normal and S2 normal.  No murmur heard. Pulmonary/Chest: Breath sounds normal. He has no rales.  Abdominal: Soft. There is no hepatomegaly.  Musculoskeletal: He exhibits no edema.  Neurological: He is alert and oriented to person, place, and time.  Skin: Skin is warm and dry.    ASSESSMENT & PLAN:    Persistent atrial fibrillation ([place]) He has had 2 recent admissions with atrial fibrillation/flutter with rapid  ventricular rate contributing to evidence of demand ischemia.  He is now controlled on dofetilide.  QTC is acceptable.  Recent lab work with the [facility] demonstrated stable potassium and magnesium.  He is currently anticoagulated with Rivaroxaban 15 mg daily.  Creatinine clearance is 43.  I reviewed his case with Dr. Acie Fredrickson.  I will touch base with Dr. Caryl Comes who saw him in the hospital to determine whether or not he should follow-up with EP to consider ablation.  Coronary artery disease involving native coronary artery of native heart without angina pectoris History of CABG in 2011.  He was admitted in April 2019 with a non-STEMI in the setting of atrial fibrillation/flutter with rapid ventricular rate.  Cardiac catheterization demonstrated patent bypass grafts.  He did have some moderate disease in a diagonal and RPDA which was treated medically.  He is not on aspirin as he does require anticoagulation with Rivaroxaban.  Continue Clopidogrel, statin, beta-blocker.  Essential (primary) hypertension The patient's blood pressure is controlled on his current regimen.  Continue current therapy.   Chronic kidney disease (CKD), stage III (moderate) (HCC)  Recent creatinine stable.  PAD (peripheral artery disease) (HCC) Continue follow-up with Dr. Fletcher Anon  Hyperlipidemia, unspecified hyperlipidemia type He is now on fenofibrate, ezetimibe, pravastatin.  Arrange follow-up lipids and LFTs in 2 months.  If LDL not at goal, refer to lipid clinic for consideration of PCSK9 inhibitor.   Dispo:  Return in about 3 months (around 02/18/2018) for Routine Follow Up, w/ Dr. Acie Fredrickson.   Medication Adjustments/Labs and Tests Ordered: Current medicines are reviewed at length with the patient today.  Concerns regarding medicines are outlined above.  Tests Ordered: Orders Placed This Encounter  Procedures  . Lipid Profile  . Hepatic function panel  . EKG 12-Lead   Medication Changes: No orders of  the defined types were placed in this encounter.   Signed, Richardson Dopp, PA-C  11/18/2017 9:26 AM    [facility] [address], [place], [place]  [address] Phone: [phone]; Fax: [phone]

## 2017-11-18 NOTE — Patient Instructions (Addendum)
Medication Instructions:  1. Your physician recommends that you continue on your current medications as directed. Please refer to the Current Medication list given to you today.   Labwork: FASTING LIPID AND LIVER PANEL TO BE DONE 01/18/18; LAB OPENS AT 7:30 AM; NOTHING TO EAT AFTER MIDNIGHT THE NIGHT BEFORE LAB WORK; YOU CAN TAKE MORNING MEDICATIONS WITH WATER THE MORNING OF LAB WORK  Testing/Procedures: NONE ORDERED TODAY  Follow-Up: DR. Acie Fredrickson ON 03/16/18 @ 8:20 AM ; PLEASE ARRIVE 15 MINUTES EARLY FOR REGISTRATION  Any Other Special Instructions Will Be Listed Below (If Applicable).     If you need a refill on your cardiac medications before your next appointment, please call your pharmacy.

## 2017-11-29 ENCOUNTER — Telehealth: Payer: Self-pay | Admitting: Physician Assistant

## 2017-11-29 NOTE — Telephone Encounter (Signed)
I s/w pt and went over recommendation per Richardson Dopp, PA and Dr. Caryl Comes in regards to possible ablation for A-fib and A-flutter. Pt is interested in discussing further and is agreeable to appt with Dr. Curt Bears. I advised pt I will have Lorenda Hatchet, EP Scheduler call him tomorrow and schedule an appt with Dr. Curt Bears to discuss possible ablation. Pt thanked me for the call.

## 2017-11-29 NOTE — Telephone Encounter (Signed)
I reviewed his case with Dr. Caryl Comes, whom he saw in the hospital. If Marvin Salinas is willing to consider undergoing a procedure to reduce the chances of having atrial fibrillation or atrial flutter in the future, refer him to Dr. Curt Bears (whom he also saw in the hospital) to discuss +/- AFib and AFlutter ablation. Marvin Dopp, PA-C    11/29/2017 4:49 PM

## 2017-12-02 ENCOUNTER — Ambulatory Visit: Payer: Medicare Other | Admitting: Cardiovascular Disease

## 2017-12-06 ENCOUNTER — Other Ambulatory Visit: Payer: Self-pay | Admitting: Cardiology

## 2017-12-13 ENCOUNTER — Encounter: Payer: Self-pay | Admitting: Cardiology

## 2017-12-13 ENCOUNTER — Ambulatory Visit: Payer: Medicare Other | Admitting: Cardiology

## 2017-12-13 VITALS — BP 122/70 | HR 52 | Ht 68.0 in | Wt 186.8 lb

## 2017-12-13 DIAGNOSIS — I481 Persistent atrial fibrillation: Secondary | ICD-10-CM

## 2017-12-13 DIAGNOSIS — I4819 Other persistent atrial fibrillation: Secondary | ICD-10-CM

## 2017-12-13 DIAGNOSIS — I25810 Atherosclerosis of coronary artery bypass graft(s) without angina pectoris: Secondary | ICD-10-CM

## 2017-12-13 DIAGNOSIS — I10 Essential (primary) hypertension: Secondary | ICD-10-CM | POA: Diagnosis not present

## 2017-12-13 NOTE — Progress Notes (Signed)
Electrophysiology Office Note   Date:  12/13/2017   ID:  Marvin, Salinas [birth year], MRN [id number]  PCP:  Aletha Halim., PA-C  Cardiologist:  [name] Primary Electrophysiologist:  Will Meredith Leeds, MD    Chief Complaint  Patient presents with  . Follow-up    AFlutter/Discuss afib/flutter ablation     History of Present Illness: Marvin Salinas is a 72 y.o. male who is being seen today for the evaluation of atrial fibrillation at the request of Liam Rogers. Presenting today for electrophysiology evaluation.  History of coronary artery disease status post CABG 2011, diabetes, hypertension, hyperlipidemia, CKD, prior TIAs, basilar out artery stenosis, PAD, and tobacco abuse.  He was admitted April 2019 with a non-ST elevation MI in the setting of atrial flutter with rapid rates.  He spontaneously converted to sinus rhythm.  Cardiac cath demonstrated patent grafts.  He was started on dofetilide at this time.    Today, he denies symptoms of palpitations, chest pain, shortness of breath, orthopnea, PND, lower extremity edema, claudication, dizziness, presyncope, syncope, bleeding, or neurologic sequela. The patient is tolerating medications without difficulties.    Past Medical History:  Diagnosis Date  . Atrial fibrillation with RVR ([place]) 10/27/2017  . Back pain   . Basilar artery stenosis    on chronic Plavix  . Cerebral vascular disease    with prior TIA's; followed by Dr. Erling Cruz  . Depression   . Diabetes mellitus    on insulin  . History of kidney stones   . History of renal calculi   . Hyperlipidemia   . Hypertension   . Ischemic heart disease    prior PCI to RCA in 1989. S/P PCI to LAD and OM in 1992. S/P PCI to first DX in 2000. S/P CABG x 3 in May 2011  . Peripheral neuropathy   . Pneumonia Sept 2012  . Sleep apnea    Past Surgical History:  Procedure Laterality Date  . ANGIOPLASTY  1989   right coronary artery  . ANGIOPLASTY  1992   LAD and OM  .  ANGIOPLASTY  1998   First DX  . CORONARY ARTERY BYPASS GRAFT  11/12/2009   LIMA to LAD, SVG to OM and SVG to RCA  . CORONARY STENT PLACEMENT  2000   Stent to LAD/Circumflex with angioplasty to first diagonal   . LEFT HEART CATH AND CORS/GRAFTS ANGIOGRAPHY N/A 10/13/2017   Procedure: LEFT HEART CATH AND CORS/GRAFTS ANGIOGRAPHY;  Surgeon: Nelva Bush, MD;  Location: [facility];  Service: Cardiovascular;  Laterality: N/A;     Current Outpatient Medications  Medication Sig Dispense Refill  . Artificial Tear Ointment (DRY EYES OP) Place 1 drop into both eyes daily. Gen teal    . clopidogrel (PLAVIX) 75 MG tablet TAKE 1 TABLET BY MOUTH EVERY DAY 90 tablet 3  . dofetilide (TIKOSYN) 125 MCG capsule Take 1 capsule (125 mcg total) by mouth 2 (two) times daily. 60 capsule 3  . ezetimibe (ZETIA) 10 MG tablet Take 1 tablet (10 mg total) by mouth daily. 30 tablet 3  . fluticasone (FLONASE) 50 MCG/ACT nasal spray Place 1 spray into both nostrils daily as needed for allergies.     . Hypertonic Nasal Wash (SINUS RINSE BOTTLE KIT NA) Place 1 spray into the nose daily as needed (congrestion).     . insulin glargine (LANTUS) 100 UNIT/ML injection Inject 35 Units into the skin 2 (two) times daily.     Marland Kitchen loratadine (CLARITIN)  10 MG tablet Take 10 mg by mouth daily as needed for allergies.     . metoprolol tartrate (LOPRESSOR) 25 MG tablet Take 0.5 tablets (12.5 mg total) by mouth 2 (two) times daily. 180 tablet 3  . nitroGLYCERIN (NITROSTAT) 0.4 MG SL tablet Place 1 tablet (0.4 mg total) under the tongue every 5 (five) minutes as needed for chest pain. 30 tablet 12  . olmesartan (BENICAR) 20 MG tablet 1 tablet daily.    . Omega-3 Fatty Acids (FISH OIL) 1000 MG CAPS Take 1,000 mg by mouth 2 (two) times daily.    . pravastatin (PRAVACHOL) 80 MG tablet Take 80 mg by mouth at bedtime.     . TRICOR 145 MG tablet TAKE 1 TABLET BY MOUTH EVERY DAY 90 tablet 1  . XARELTO 15 MG TABS tablet TAKE 1 TABLET (15  MG TOTAL) BY MOUTH DAILY WITH SUPPER. 90 tablet 1   No current facility-administered medications for this visit.     Allergies:   Codeine; Lisinopril; Nsaids; and Latex   Social History:  The patient  reports that he quit smoking about 8 years ago. His smoking use included cigarettes. He has a 54.00 pack-year smoking history. He has never used smokeless tobacco. He reports that he does not drink alcohol or use drugs.   Family History:  The patient's family history includes Asthma in his brother; Heart attack in his father; Heart disease in his father; Hypertension in his father; Stroke in his unknown relative.    ROS:  Please see the history of present illness.   Otherwise, review of systems is positive for chest pressure, leg pain, palpitations, visual changes, cough, snoring, constipation, diarrhea, back pain, muscle pain, easy bruising, bleeding.   All other systems are reviewed and negative.    PHYSICAL EXAM: VS:  BP 122/70   Pulse (!) 52   Ht 5' 8" (1.727 m)   Wt 186 lb 12.8 oz (84.7 kg)   BMI 28.40 kg/m  , BMI Body mass index is 28.4 kg/m. GEN: Well nourished, well developed, in no acute distress  HEENT: normal  Neck: no JVD, carotid bruits, or masses Cardiac: RRR; no murmurs, rubs, or gallops,no edema  Respiratory:  clear to auscultation bilaterally, normal work of breathing GI: soft, nontender, nondistended, + BS MS: no deformity or atrophy  Skin: warm and dry Neuro:  Strength and sensation are intact Psych: euthymic mood, full affect  EKG:  EKG is ordered today. Personal review of the ekg ordered shows SR, rate 52, IMI, QTc 450  Recent Labs: 10/11/2017: ALT 35; B Natriuretic Peptide 140.6 10/27/2017: TSH 0.903 10/30/2017: Hemoglobin 14.6; Platelets 248 11/08/2017: BUN 21; Creatinine, Ser 1.87; Magnesium 1.8; Potassium 4.4; Sodium 141    Lipid Panel     Component Value Date/Time   CHOL 171 10/12/2017 0411   TRIG 92 10/12/2017 0411   HDL 42 10/12/2017 0411    CHOLHDL 4.1 10/12/2017 0411   VLDL 18 10/12/2017 0411   LDLCALC 111 (H) 10/12/2017 0411     Wt Readings from Last 3 Encounters:  12/13/17 186 lb 12.8 oz (84.7 kg)  11/18/17 186 lb (84.4 kg)  11/08/17 187 lb 6.4 oz (85 kg)      Other studies Reviewed: Additional studies/ records that were reviewed today include: TTE 10/12/17  Review of the above records today demonstrates:  - Left ventricle: The cavity size was normal. Wall thickness was   increased in a pattern of mild LVH. Systolic function was normal.  The estimated ejection fraction was in the range of 55% to 60%.   Wall motion was normal; there were no regional wall motion   abnormalities. Left ventricular diastolic function parameters   were normal.  LHC 10/13/17 1. Severe 3-vessel coronary artery disease with chronic total occlusions of the proximal/mid LAD, mid/distal LCx, and proximal RCA. 2. Moderate caliber D1 and distal rPDA demonstrate 70-80% stenoses and are not revascularized. 3. Widely patent LIMA-LAD, SVG-OM2, and SVG-rPDA. 4. Moderately elevated left ventricular filling pressure (LVEDP 28 mmHg).   ASSESSMENT AND PLAN:  1.  Atypical atrial flutter: Currently doing well on dofetilide.  QTC has remained stable.  Continue Xarelto.  No changes.  This patients CHA2DS2-VASc Score and unadjusted Ischemic Stroke Rate (% per year) is equal to 9.7 % stroke rate/year from a score of 6  Above score calculated as 1 point each if present [CHF, HTN, DM, Vascular=MI/PAD/Aortic Plaque, Age if 65-74, or Male] Above score calculated as 2 points each if present [Age > 75, or Stroke/TIA/TE]   2.  Coronary artery disease: Currently on Plavix.  Has a history of CABG in the past.  No current chest pain.  3.  Hypertension: Currently well controlled.  Current medicines are reviewed at length with the patient today.   The patient does not have concerns regarding his medicines.  The following changes were made today:  none  Labs/  tests ordered today include:  Orders Placed This Encounter  Procedures  . EKG 12-Lead     Disposition:   FU with Will Camnitz 6 months  Signed, Will Meredith Leeds, MD  12/13/2017 10:48 AM     [facility] [address] [place] [address] [phone] (office) [phone] (fax)

## 2017-12-13 NOTE — Patient Instructions (Signed)
Medication Instructions:  Your physician recommends that you continue on your current medications as directed. Please refer to the Current Medication list given to you today.  Labwork: None ordered     *We will only notify you of abnormal results, otherwise continue current treatment plan.  Testing/Procedures: None ordered  Follow-Up: Your physician wants you to follow-up in: 6 months  with Dr. Camnitz.  You will receive a reminder letter in the mail two months in advance. If you don't receive a letter, please call our office to schedule the follow-up appointment.   * If you need a refill on your cardiac medications before your next appointment, please call your pharmacy.   *Please note that any paperwork needing to be filled out by the provider will need to be addressed at the front desk prior to seeing the provider. Please note that any FMLA, disability or other documents regarding health condition is subject to a $25.00 charge that must be received prior to completion of paperwork in the form of a money order or check.  Thank you for choosing [facility]!!   Shivangi Lutz, RN [phone]      

## 2017-12-26 ENCOUNTER — Encounter: Payer: Self-pay | Admitting: Pulmonary Disease

## 2017-12-26 ENCOUNTER — Ambulatory Visit: Payer: Medicare Other | Admitting: Pulmonary Disease

## 2017-12-26 VITALS — BP 110/64 | HR 59 | Ht 68.0 in | Wt 186.0 lb

## 2017-12-26 DIAGNOSIS — G4719 Other hypersomnia: Secondary | ICD-10-CM

## 2017-12-26 DIAGNOSIS — R0683 Snoring: Secondary | ICD-10-CM

## 2017-12-26 NOTE — Progress Notes (Signed)
   Subjective:    Patient ID: Marvin Salinas, male    DOB: 1945-06-27, 72 y.o.   MRN: [id number]  HPI    Review of Systems  Constitutional: Negative for fever and unexpected weight change.  HENT: Positive for nosebleeds, postnasal drip and sneezing. Negative for congestion, dental problem, ear pain, rhinorrhea, sinus pressure, sore throat and trouble swallowing.   Eyes: Negative for redness and itching.  Respiratory: Positive for shortness of breath and wheezing. Negative for cough and chest tightness.   Cardiovascular: Negative for palpitations and leg swelling.  Gastrointestinal: Negative for nausea and vomiting.  Genitourinary: Negative for dysuria.  Musculoskeletal: Negative for joint swelling.  Skin: Negative for rash.  Allergic/Immunologic: Positive for environmental allergies. Negative for food allergies and immunocompromised state.  Neurological: Negative for headaches.  Hematological: Bruises/bleeds easily.  Psychiatric/Behavioral: Negative for dysphoric mood. The patient is not nervous/anxious.        Objective:   Physical Exam        Assessment & Plan:

## 2017-12-26 NOTE — Progress Notes (Signed)
[facility]  Chief Complaint  Patient presents with  . sleep consult    Pt referred by Dr. Bing Matter MD. Pt gasps for air at times, and snores loudly. Pt has some daytime sleepiness.     Constitutional: BP 110/64 (BP Location: Left Arm, Cuff Size: Normal)   Pulse (!) 59   Ht _0  (1.727 m)   Wt 186 lb (84.4 kg)   SpO2 98%   BMI 28.28 kg/m   History of Present Illness: Marvin Salinas is a 72 y.o. male with snoring.  He has noticed trouble with his sleep for years.  He was seen by neurology before after having a stroke, and told he needed a sleep study.  He was in hospital recently with chest pain and a fib.  He was noted to have snoring, and stop breathing while asleep.  He has trouble sleeping on his back.  He used to wear a mouth guard when sleeping due to teeth grinding.  He goes to sleep at 11 pm.  He takes up to hour to fall asleep.  He wakes up 1 or 2 times to use the bathroom.  He gets out of bed at 630.  He feels okay in the morning.  He denies morning headache.  He does not use anything to help him fall sleep.  He drinks two cups of coffee in the morning.  He falls asleep frequently when watching TV.Marland Kitchen  He denies sleep walking, sleep talking, or nightmares.  There is no history of restless legs.  He denies sleep hallucinations, sleep paralysis, or cataplexy.  The Epworth score is 9 out of 24.  He broke his nose years ago.  He has trouble breathing through his nose at times, and gets frequent episodes of sinusitis.  He uses nasal irrigation and flonase intermittently.  Comprehensive Respiratory Exam:  Appearance - well kempt  ENMT - nasal mucosa moist, turbinates clear, nasal septum deviated, poor dentition, no gingival bleeding, no oral exudates, no tonsillar hypertrophy Neck - no masses, trachea midline, no thyromegaly, no elevation in JVP Respiratory - normal appearance of chest wall, normal respiratory effort w/o accessory  muscle use, no dullness on percussion, no tactile fremitus, no wheezing or rales CV - s1s2 regular rate and rhythm, no murmurs, no peripheral edema, no varicosities, radial pulses symmetric GI - soft, non tender, no masses, no hepatosplenomegaly Lymph - no adenopathy noted in neck and axillary areas MSK - normal muscle strength and tone, normal gait Ext - no cyanosis, clubbing, or joint inflammation noted Skin - no rashes, lesions, or ulcers Neuro - oriented to person, place, and time Psych - normal mood and affect  Discussion: He has snoring, sleep disruption, witnessed apnea, and daytime sleepiness.  He has hx of CAD, HTN, A fib, and CVA.  His brother has sleep apnea.  I am concerned he could have sleep apnea also.  Assessment/Plan:   Snoring with excessive daytime sleepiness. - will need to arrange for a home sleep study  Cardiovascular risk. - had an extensive discussion regarding the adverse health consequences related to untreated sleep disordered breathing - specifically discussed the risks for hypertension, coronary artery disease, cardiac dysrhythmias, cerebrovascular disease, and diabetes - lifestyle modification discussed  Safe driving practices. - discussed how sleep disruption can increase risk of accidents, particularly when driving - safe driving practices were discussed  Therapies for obstructive sleep apnea. - if the sleep study shows significant sleep apnea, then various therapies  for treatment were reviewed: CPAP, oral appliance, and surgical interventions    Patient Instructions  Will arrange for home sleep study Will call to arrange for follow up after sleep study reviewed     Chesley Mires, MD [place] 12/26/2017, 2:08 PM  Flow Sheet  Sleep tests:  Cardiac tests: Echo 10/12/17 >> mild LVH, EF 55 to 60% LHC 10/13/17 >> severe CAD, patent grafts  Review of Systems: Constitutional: Negative for fever and unexpected weight change.   HENT: Positive for nosebleeds, postnasal drip and sneezing. Negative for congestion, dental problem, ear pain, rhinorrhea, sinus pressure, sore throat and trouble swallowing.   Eyes: Negative for redness and itching.  Respiratory: Positive for shortness of breath and wheezing. Negative for cough and chest tightness.   Cardiovascular: Negative for palpitations and leg swelling.  Gastrointestinal: Negative for nausea and vomiting.  Genitourinary: Negative for dysuria.  Musculoskeletal: Negative for joint swelling.  Skin: Negative for rash.  Allergic/Immunologic: Positive for environmental allergies. Negative for food allergies and immunocompromised state.  Neurological: Negative for headaches.  Hematological: Bruises/bleeds easily.  Psychiatric/Behavioral: Negative for dysphoric mood. The patient is not nervous/anxious.    Past Medical History: He  has a past medical history of Atrial fibrillation with RVR ([place]) (10/27/2017), Back pain, Basilar artery stenosis, Cerebral vascular disease, Depression, Diabetes mellitus, History of kidney stones, History of renal calculi, Hyperlipidemia, Hypertension, Ischemic heart disease, Peripheral neuropathy, Pneumonia (Sept 2012), and Sleep apnea.  Past Surgical History: He  has a past surgical history that includes Angioplasty (1989); Angioplasty (1992); Angioplasty (1998); Coronary stent placement (2000); LEFT HEART CATH AND CORS/GRAFTS ANGIOGRAPHY (N/A, 10/13/2017); and Coronary artery bypass graft (11/12/2009).  Family History: His family history includes Asthma in his brother; Heart attack in his father; Heart disease in his father; Hypertension in his father; Stroke in his unknown relative.  Social History: He  reports that he quit smoking about 8 years ago. His smoking use included cigarettes. He has a 54.00 pack-year smoking history. He has never used smokeless tobacco. He reports that he does not drink alcohol or use  drugs.  Medications: Allergies as of 12/26/2017      Reactions   Codeine Nausea And Vomiting   Lisinopril Cough   Other reaction(s): Cough (ALLERGY/intolerance)   Nsaids    Other reaction(s): Other (See Comments) CKD Other reaction(s): Other CKD   Latex Hives      Medication List        Accurate as of 12/26/17  2:08 PM. Always use your most recent med list.          clopidogrel 75 MG tablet Commonly known as:  PLAVIX TAKE 1 TABLET BY MOUTH EVERY DAY   dofetilide 125 MCG capsule Commonly known as:  TIKOSYN Take 1 capsule (125 mcg total) by mouth 2 (two) times daily.   DRY EYES OP Place 1 drop into both eyes daily. Gen teal   ezetimibe 10 MG tablet Commonly known as:  ZETIA Take 1 tablet (10 mg total) by mouth daily.   Fish Oil 1000 MG Caps Take 1,000 mg by mouth 2 (two) times daily.   fluticasone 50 MCG/ACT nasal spray Commonly known as:  FLONASE Place 1 spray into both nostrils daily as needed for allergies.   insulin glargine 100 UNIT/ML injection Commonly known as:  LANTUS Inject 35 Units into the skin 2 (two) times daily.   loratadine 10 MG tablet Commonly known as:  CLARITIN Take 10 mg by mouth daily as needed for allergies.  metoprolol tartrate 25 MG tablet Commonly known as:  LOPRESSOR Take 0.5 tablets (12.5 mg total) by mouth 2 (two) times daily.   nitroGLYCERIN 0.4 MG SL tablet Commonly known as:  NITROSTAT Place 1 tablet (0.4 mg total) under the tongue every 5 (five) minutes as needed for chest pain.   olmesartan 20 MG tablet Commonly known as:  BENICAR 1 tablet daily.   pravastatin 80 MG tablet Commonly known as:  PRAVACHOL Take 80 mg by mouth at bedtime.   SINUS RINSE BOTTLE KIT NA Place 1 spray into the nose daily as needed (congrestion).   TRICOR 145 MG tablet Generic drug:  fenofibrate TAKE 1 TABLET BY MOUTH EVERY DAY   XARELTO 15 MG Tabs tablet Generic drug:  Rivaroxaban TAKE 1 TABLET (15 MG TOTAL) BY MOUTH DAILY WITH  SUPPER.

## 2017-12-26 NOTE — Patient Instructions (Signed)
Will arrange for home sleep study Will call to arrange for follow up after sleep study reviewed  

## 2018-01-08 ENCOUNTER — Other Ambulatory Visit: Payer: Self-pay | Admitting: Cardiology

## 2018-01-10 DIAGNOSIS — G4733 Obstructive sleep apnea (adult) (pediatric): Secondary | ICD-10-CM | POA: Diagnosis not present

## 2018-01-11 ENCOUNTER — Encounter: Payer: Self-pay | Admitting: Pulmonary Disease

## 2018-01-11 ENCOUNTER — Telehealth: Payer: Self-pay | Admitting: Pulmonary Disease

## 2018-01-11 ENCOUNTER — Other Ambulatory Visit: Payer: Self-pay | Admitting: *Deleted

## 2018-01-11 DIAGNOSIS — G4733 Obstructive sleep apnea (adult) (pediatric): Secondary | ICD-10-CM

## 2018-01-11 DIAGNOSIS — G4719 Other hypersomnia: Secondary | ICD-10-CM

## 2018-01-11 DIAGNOSIS — R0683 Snoring: Secondary | ICD-10-CM

## 2018-01-11 HISTORY — DX: Obstructive sleep apnea (adult) (pediatric): G47.33

## 2018-01-11 NOTE — Telephone Encounter (Signed)
HST 01/10/18 >> AHI 18.1, SaO2 low 73%   Will have my nurse inform pt that sleep study shows moderate sleep apnea.  Options are 1) CPAP now, 2) ROV first.  If He is agreeable to CPAP, then please send order for auto CPAP range 5 to 15 cm H2O with heated humidity and mask of choice.  Have download sent 1 month after starting CPAP and set up ROV 2 months after starting CPAP.  ROV can be with me or NP.

## 2018-01-13 NOTE — Telephone Encounter (Signed)
Called and spoke with patient regarding results.  Informed the patient of results and recommendations today. Pt is agreeable to CPAP, placed order auto CPAP range 5 to 15 cm H2O with heated humidity and mask of choice.  Have download sent 1 month after starting CPAP and set up ROV 2 months after starting CPAP.   ROV can be with VS or NP in 66mo after set up on CPAP; pt will call with appt in Aug 2019 Pt verbalized understanding and denied any questions or concerns at this time.  Nothing further needed.

## 2018-01-18 ENCOUNTER — Other Ambulatory Visit: Payer: Medicare Other | Admitting: *Deleted

## 2018-01-18 DIAGNOSIS — N183 Chronic kidney disease, stage 3 unspecified: Secondary | ICD-10-CM

## 2018-01-18 DIAGNOSIS — E785 Hyperlipidemia, unspecified: Secondary | ICD-10-CM

## 2018-01-18 LAB — LIPID PANEL
Chol/HDL Ratio: 3.6 ratio (ref 0.0–5.0)
Cholesterol, Total: 165 mg/dL (ref 100–199)
HDL: 46 mg/dL (ref >39)
LDL Calculated: 86 mg/dL (ref 0–99)
Triglycerides: 164 mg/dL — ABNORMAL HIGH (ref 0–149)
VLDL Cholesterol Cal: 33 mg/dL (ref 5–40)

## 2018-01-18 LAB — HEPATIC FUNCTION PANEL
ALT: 26 IU/L (ref 0–44)
AST: 46 IU/L — ABNORMAL HIGH (ref 0–40)
Albumin: 4.5 g/dL (ref 3.5–4.8)
Alkaline Phosphatase: 43 IU/L (ref 39–117)
Bilirubin Total: 0.4 mg/dL (ref 0.0–1.2)
Bilirubin, Direct: 0.14 mg/dL (ref 0.00–0.40)
Total Protein: 6.3 g/dL (ref 6.0–8.5)

## 2018-01-19 ENCOUNTER — Telehealth: Payer: Self-pay | Admitting: *Deleted

## 2018-01-19 DIAGNOSIS — E785 Hyperlipidemia, unspecified: Secondary | ICD-10-CM

## 2018-01-19 DIAGNOSIS — I25810 Atherosclerosis of coronary artery bypass graft(s) without angina pectoris: Secondary | ICD-10-CM

## 2018-01-19 MED ORDER — ROSUVASTATIN CALCIUM 20 MG PO TABS: 20.0000 mg | ORAL_TABLET | Freq: Every day | ORAL | 3 refills | Status: DC

## 2018-01-19 NOTE — Telephone Encounter (Signed)
Follow up    Patient  is returning you call in reference to labs. Please return call.

## 2018-01-19 NOTE — Telephone Encounter (Signed)
-----   Message from Liliane Shi, [place] sent at 01/19/2018  9:16 AM EDT ----- The LDL is not at goal.  Goal is < 70.  LFTs are ok. It looks like he has only been on Pravastatin in the past. If he is willing, I suggest changing Pravastatin to Rosuvastatin 40 mg QD. - Stop Pravastatin - Stop Fenofibrate - Start Rosuvastatin 20 mg Once daily  - Continue Ezetimibe  - Lipids and LFTs in 8 weeks. If he does not want to change Pravastatin, I would recommend referral to the [facility]. Richardson Dopp, PA-C    01/19/2018 9:15 AM

## 2018-01-19 NOTE — Telephone Encounter (Signed)
Pt has been notified of lab results. Pt is agreeable to recommendations to stop Pravastatin, start Crestor 20 mg daily with fasting labs to be done 03/16/18 when he comes in to see Dr. Acie Fredrickson, also stop Fenofibrate, continue Zetia. Pt thanked me for the call.

## 2018-01-19 NOTE — Telephone Encounter (Signed)
-----   Message from Liliane Shi, [place] sent at 01/19/2018  9:16 AM EDT ----- The LDL is not at goal.  Goal is < 70.  LFTs are ok. It looks like he has only been on Pravastatin in the past. If he is willing, I suggest changing Pravastatin to Rosuvastatin 40 mg QD. - Stop Pravastatin - Stop Fenofibrate - Start Rosuvastatin 20 mg Once daily  - Continue Ezetimibe  - Lipids and LFTs in 8 weeks. If he does not want to change Pravastatin, I would recommend referral to the [facility]. Richardson Dopp, PA-C    01/19/2018 9:15 AM

## 2018-01-19 NOTE — Telephone Encounter (Signed)
Left message to go over results and recommendations.  

## 2018-01-25 ENCOUNTER — Ambulatory Visit: Payer: Medicare Other | Admitting: Nurse Practitioner

## 2018-02-21 LAB — HEPATIC FUNCTION PANEL
ALT: 25 (ref 10–40)
AST: 28 (ref 14–40)
Bilirubin, Total: 21

## 2018-02-21 LAB — HEMOGLOBIN A1C: Hemoglobin A1C: 8.9

## 2018-02-21 LAB — BASIC METABOLIC PANEL
Creatinine: 1.5 — AB (ref 0.6–1.3)
Potassium: 4.5 (ref 3.4–5.3)
Sodium: 140 (ref 137–147)

## 2018-03-06 ENCOUNTER — Other Ambulatory Visit: Payer: Self-pay | Admitting: Cardiovascular Disease

## 2018-03-06 DIAGNOSIS — I6523 Occlusion and stenosis of bilateral carotid arteries: Secondary | ICD-10-CM

## 2018-03-16 ENCOUNTER — Encounter: Payer: Self-pay | Admitting: Cardiovascular Disease

## 2018-03-16 ENCOUNTER — Ambulatory Visit (HOSPITAL_COMMUNITY)
Admission: RE | Admit: 2018-03-16 | Discharge: 2018-03-16 | Disposition: A | Discharge status: Home / Self Care | Payer: Medicare HMO | Source: Ambulatory Visit | Attending: Cardiology | Admitting: Cardiology

## 2018-03-16 ENCOUNTER — Ambulatory Visit: Payer: Medicare HMO | Admitting: Cardiovascular Disease

## 2018-03-16 VITALS — BP 124/56 | HR 49 | Ht 68.0 in | Wt 184.0 lb

## 2018-03-16 DIAGNOSIS — I25810 Atherosclerosis of coronary artery bypass graft(s) without angina pectoris: Secondary | ICD-10-CM

## 2018-03-16 DIAGNOSIS — E785 Hyperlipidemia, unspecified: Secondary | ICD-10-CM

## 2018-03-16 DIAGNOSIS — I6523 Occlusion and stenosis of bilateral carotid arteries: Secondary | ICD-10-CM | POA: Diagnosis present

## 2018-03-16 DIAGNOSIS — I480 Paroxysmal atrial fibrillation: Secondary | ICD-10-CM

## 2018-03-16 LAB — BASIC METABOLIC PANEL
BUN/Creatinine Ratio: 16 (ref 10–24)
BUN: 23 mg/dL (ref 8–27)
CO2: 22 mmol/L (ref 20–29)
Calcium: 9.3 mg/dL (ref 8.6–10.2)
Chloride: 102 mmol/L (ref 96–106)
Creatinine, Ser: 1.47 mg/dL — ABNORMAL HIGH (ref 0.76–1.27)
GFR calc Af Amer: 54 mL/min/{1.73_m2} — ABNORMAL LOW (ref >59)
GFR calc non Af Amer: 47 mL/min/{1.73_m2} — ABNORMAL LOW (ref >59)
Glucose: 233 mg/dL — ABNORMAL HIGH (ref 65–99)
Potassium: 4.4 mmol/L (ref 3.5–5.2)
Sodium: 140 mmol/L (ref 134–144)

## 2018-03-16 LAB — MAGNESIUM: Magnesium: 2 mg/dL (ref 1.6–2.3)

## 2018-03-16 LAB — HEPATIC FUNCTION PANEL
ALT: 24 IU/L (ref 0–44)
AST: 20 IU/L (ref 0–40)
Albumin: 4.7 g/dL (ref 3.5–4.8)
Alkaline Phosphatase: 62 IU/L (ref 39–117)
Bilirubin Total: 0.4 mg/dL (ref 0.0–1.2)
Bilirubin, Direct: 0.15 mg/dL (ref 0.00–0.40)
Total Protein: 6.4 g/dL (ref 6.0–8.5)

## 2018-03-16 LAB — LIPID PANEL
Chol/HDL Ratio: 2.8 ratio (ref 0.0–5.0)
Cholesterol, Total: 127 mg/dL (ref 100–199)
HDL: 45 mg/dL (ref >39)
LDL Calculated: 49 mg/dL (ref 0–99)
Triglycerides: 165 mg/dL — ABNORMAL HIGH (ref 0–149)
VLDL Cholesterol Cal: 33 mg/dL (ref 5–40)

## 2018-03-16 NOTE — Patient Instructions (Signed)
Medication Instructions:  Your physician recommends that you continue on your current medications as directed. Please refer to the Current Medication list given to you today.   Labwork: TODAY - Magnesium, Basic metabolic panel, Liver panel, Cholesterol   Testing/Procedures: None Ordered   Follow-Up: Your physician wants you to follow-up in: 6 months with Richardson Dopp, PA. You will receive a reminder letter in the mail two months in advance. If you don't receive a letter, please call our office to schedule the follow-up appointment.   If you need a refill on your cardiac medications before your next appointment, please call your pharmacy.   Thank you for choosing [facility]! Christen Bame, RN [phone]

## 2018-03-16 NOTE — Progress Notes (Signed)
Marvin Salinas Date of Birth  1946-04-29       [facility] [address], [address], [place], [place]  [address]   [place], [place]  [address] [phone]     [phone]   Fax  [phone]    Fax [phone]  Problem List: 1. Coronary artery disease-status post CABG-2001 2. Hypertension 3. Hyperlipidemia 4. Diabetes mellitus 5. Extensive cerebrovascular disease with prior TIAs and known basilar artery stenosis  History of Present Illness: Marvin Salinas is seen back today for a 3 month check.  He is a former patient of Dr. Susa Simmonds. He has known CAD with prior CABG back in 2011. Other issues include HTN, HLD, DM and extensive cerebral vascular disease with prior TIA's and known basilar artery stenosis. He is on chronic Plavix and aspirin therapy. Was on coumadin in the remote past.  Pt has done well. Complains of back pain.  He gets some regular exercise - walks on occasion.  September 08, 2015: Marvin Salinas is seen back after a 3-4 year absence. Former patient of [place].  CAD - CABG in 2011,   Cerebral vascular disease   Doing well,  No CP , no further TIAs  HR is slow,  No syncope or presyncope.   HR has been slow for a while  Exercises,  Still paints houses.   Oct 20, 2016:  Marvin Salinas is doing well from a cardiac standpont. No CP or dyspnea Has leg cramp / burning with exercise Is on pravachol for hyperlipidemia,  Has not tolerated the other statins   Sept. 26, 2019:  Marvin Salinas has done well.  He was admitted to the hospital in April, 2019 with a non-ST segment elevation myocardial infarction.  He also had developed atrial flutter with rapid rates.  He spontaneously converted to sinus rhythm.  He was started on Tikosyn at that time.  Heart catheterization revealed patent grafts.  Has had rare episodes of chest pressure - thinks it was indigestion. Processes on a regular basis.  He walks regularly.  He also has a history of  hyperlipidemia and diabetes mellitus.   Current Outpatient Medications on File Prior to Visit  Medication Sig Dispense Refill  . Artificial Tear Ointment (DRY EYES OP) Place 1 drop into both eyes daily. Gen teal    . clopidogrel (PLAVIX) 75 MG tablet TAKE 1 TABLET BY MOUTH EVERY DAY 90 tablet 3  . dofetilide (TIKOSYN) 125 MCG capsule Take 1 capsule (125 mcg total) by mouth 2 (two) times daily. 60 capsule 3  . ezetimibe (ZETIA) 10 MG tablet TAKE 1 TABLET BY MOUTH EVERY DAY 90 tablet 1  . fluticasone (FLONASE) 50 MCG/ACT nasal spray Place 1 spray into both nostrils daily as needed for allergies.     . Hypertonic Nasal Wash (SINUS RINSE BOTTLE KIT NA) Place 1 spray into the nose daily as needed (congrestion).     . insulin glargine (LANTUS) 100 UNIT/ML injection Inject 35 Units into the skin 2 (two) times daily.     Marland Kitchen loratadine (CLARITIN) 10 MG tablet Take 10 mg by mouth daily as needed for allergies.     . metoprolol tartrate (LOPRESSOR) 25 MG tablet Take 0.5 tablets (12.5 mg total) by mouth 2 (two) times daily. 180 tablet 3  . nitroGLYCERIN (NITROSTAT) 0.4 MG SL tablet Place 1 tablet (0.4 mg total) under the tongue every 5 (five) minutes as needed for chest pain. 30 tablet 12  .  olmesartan (BENICAR) 20 MG tablet Take 1 tablet by mouth daily.     . Omega-3 Fatty Acids (FISH OIL) 1000 MG CAPS Take 1,000 mg by mouth 2 (two) times daily.    . rosuvastatin (CRESTOR) 20 MG tablet Take 1 tablet (20 mg total) by mouth daily. 90 tablet 3  . XARELTO 15 MG TABS tablet TAKE 1 TABLET (15 MG TOTAL) BY MOUTH DAILY WITH SUPPER. 90 tablet 1   No current facility-administered medications on file prior to visit.    metoprolol 50 mg twice a day  Allergies  Allergen Reactions  . Codeine Nausea And Vomiting  . Lisinopril Cough    Other reaction(s): Cough (ALLERGY/intolerance)  . Nsaids     Other reaction(s): Other (See Comments) CKD Other reaction(s): Other CKD  . Latex Hives    Past Medical History:    Diagnosis Date  . Atrial fibrillation with RVR ([place]) 10/27/2017  . Back pain   . Basilar artery stenosis    on chronic Plavix  . Cerebral vascular disease    with prior TIA's; followed by Dr. Erling Cruz  . Depression   . Diabetes mellitus    on insulin  . History of kidney stones   . History of renal calculi   . Hyperlipidemia   . Hypertension   . Ischemic heart disease    prior PCI to RCA in 1989. S/P PCI to LAD and OM in 1992. S/P PCI to first DX in 2000. S/P CABG x 3 in May 2011  . OSA (obstructive sleep apnea) 01/11/2018  . Peripheral neuropathy   . Pneumonia Sept 2012  . Sleep apnea     Past Surgical History:  Procedure Laterality Date  . ANGIOPLASTY  1989   right coronary artery  . ANGIOPLASTY  1992   LAD and OM  . ANGIOPLASTY  1998   First DX  . CORONARY ARTERY BYPASS GRAFT  11/12/2009   LIMA to LAD, SVG to OM and SVG to RCA  . CORONARY STENT PLACEMENT  2000   Stent to LAD/Circumflex with angioplasty to first diagonal   . LEFT HEART CATH AND CORS/GRAFTS ANGIOGRAPHY N/A 10/13/2017   Procedure: LEFT HEART CATH AND CORS/GRAFTS ANGIOGRAPHY;  Surgeon: Nelva Bush, MD;  Location: [facility];  Service: Cardiovascular;  Laterality: N/A;    Social History   Tobacco Use  Smoking Status Former Smoker  . Packs/day: 1.00  . Years: 54.00  . Pack years: 54.00  . Types: Cigarettes  . Last attempt to quit: 10/19/2009  . Years since quitting: 8.4  Smokeless Tobacco Never Used    Social History   Substance and Sexual Activity  Alcohol Use No    Family History  Problem Relation Age of Onset  . Hypertension Father   . Heart disease Father   . Heart attack Father   . Asthma Brother   . Stroke Unknown        Uncle    Reviw of Systems:  Reviewed in the HPI.  All other systems are negative.  Physical Exam: Blood pressure (!) 124/56, pulse (!) 49, height '5\' 8"'  (1.727 m), weight 184 lb (83.5 kg), SpO2 99 %.  GEN:  Well nourished, well developed in no acute  distress HEENT: Normal NECK: No JVD; No carotid bruits LYMPHATICS: No lymphadenopathy CARDIAC: RRR  RESPIRATORY:  Clear to auscultation without rales, wheezing or rhonchi  ABDOMEN: Soft, non-tender, non-distended MUSCULOSKELETAL:  No edema; No deformity  SKIN: Warm and dry NEUROLOGIC:  Alert and oriented  x 3   ECG:   March 16, 2018: Sinus bradycardia at 50 beats a minute.  Possible inferior wall myocardial infarction.  QTC is 474 ms.   Assessment / Plan:   1. Coronary artery disease-status post CABG-2011, No angina   2.  Atrial fib:  On tikosyn , ECG remains stable  Is on Xarelto - has had rare nose bleeds.   2. Hypertension - doing well.   He is bradycardic  3. Hyperlipidemia -  Check labs today   4. Diabetes mellitus - managed by his primary MD  5.  Carotid artery disease:   Has mild bilateral carotid disease  Repeat scan today     Mertie Moores, MD  03/16/2018 8:52 AM    [facility] [place],  [place], [place]  [address] Pager [phone] Phone: [phone]; Fax: [phone]

## 2018-03-21 ENCOUNTER — Telehealth: Payer: Self-pay | Admitting: *Deleted

## 2018-03-21 DIAGNOSIS — I6523 Occlusion and stenosis of bilateral carotid arteries: Secondary | ICD-10-CM

## 2018-03-21 NOTE — Telephone Encounter (Signed)
Patient made aware of results and verbalized understanding. Repeat orders placed.    

## 2018-03-21 NOTE — Telephone Encounter (Signed)
-----   Message from Wellington Hampshire, MD sent at 03/20/2018  6:23 PM EDT ----- Inform patient that carotid Doppler showed stable mild bilateral disease.  Repeat study in 2 years.

## 2018-03-25 LAB — HM DIABETES EYE EXAM

## 2018-06-25 ENCOUNTER — Other Ambulatory Visit: Payer: Self-pay | Admitting: Cardiovascular Disease

## 2018-06-26 NOTE — Telephone Encounter (Signed)
Refill Request.  

## 2018-06-30 ENCOUNTER — Other Ambulatory Visit: Payer: Self-pay | Admitting: Cardiology

## 2018-07-04 ENCOUNTER — Ambulatory Visit: Payer: Medicare HMO | Admitting: Cardiovascular Disease

## 2018-09-06 ENCOUNTER — Encounter: Payer: Self-pay | Admitting: Physician Assistant

## 2018-09-11 ENCOUNTER — Telehealth: Payer: Self-pay | Admitting: Physician Assistant

## 2018-09-11 NOTE — Telephone Encounter (Signed)
   Cardiac Questionnaire:   Since your last visit or hospitalization:    1. Have you been having new or worsening chest pain? no   2. Have you been having new or worsening shortness of breath? no 3. Have you been having new or worsening leg swelling, wt gain, or increase in abdominal girth (pants fitting more tightly)? no   4. Have you had any passing out spells? no  The patient has a hx of parox AF.  He has occasional episodes of palpitations and chest pain.  It lasts a minute or so.  He has not noted any increased frequency or intensity.  He is overall stable and is ok to cancel his appt.  He will need to be rescheduled in 4 weeks.       Please reschedule for ~4 weeks with Richardson Dopp or Mertie Moores, MD  He does not have access to a computer. If needed, we could do a telephone visit.  Richardson Dopp, PA-C    09/11/2018 5:03 PM

## 2018-09-12 ENCOUNTER — Ambulatory Visit: Payer: Medicare HMO | Admitting: Physician Assistant

## 2018-09-15 ENCOUNTER — Ambulatory Visit: Payer: Self-pay | Admitting: Physician Assistant

## 2018-09-25 NOTE — Telephone Encounter (Signed)
Pt is scheduled for virtual visit with Richardson Dopp, PA-C, 4/8,  Will remove from Covid 19 cancel pool

## 2018-09-25 NOTE — Progress Notes (Signed)
Virtual Visit via Video Note   This visit type was conducted due to national recommendations for restrictions regarding the COVID-19 Pandemic (e.g. social distancing) in an effort to limit this patient's exposure and mitigate transmission in our community.  Due to his co-morbid illnesses, this patient is at least at moderate risk for complications without adequate follow up.  This format is felt to be most appropriate for this patient at this time.  All issues noted in this document were discussed and addressed.  A limited physical exam was performed with this format.  Please refer to the patient's chart for his consent to telehealth for [facility].   Evaluation Performed:  Follow-up visit  Date:  09/27/2018   ID:  Marvin Salinas, DOB [birth year], MRN [id number]  Patient Location: Home  Provider Location: Home  PCP:  Aletha Halim., PA-C  Cardiologist:  Mertie Moores, MD   Electrophysiologist:  Will Meredith Leeds, MD  PV: Kathlyn Sacramento, MD Nephrologist: Laurena Slimmer, MD [place])  Chief Complaint:   Follow-up on atrial fibrillation, coronary artery disease  History of Present Illness:    Marvin Salinas is a 73 y.o. male who presents via audio/video conferencing for a telehealth visit today.    He has coronary artery disease status post CABG in 2011, persistent AFib/Flutter, diabetes, hypertension, hyperlipidemia, chronic kidney disease, prior TIAs, known basilar artery stenosis, peripheral arterial disease, tobacco abuse, sleep apnea.  He was admitted in April 2019 with non-ST elevation myocardial infarction in the setting of atrial flutter with rapid ventricular rate.  He converted spontaneously to normal sinus rhythm.  Cardiac catheterization demonstrated patent bypass grafts.  Troponin elevation was felt to be related to demand ischemia in the setting of atrial flutter.  There was moderate caliber D1 and distal RPDA stenosis.  PCI could be considered of the D1 if the patient  failed optimal medical therapy.  The distal RPDA was too small for PCI.  He is on Rivaroxaban for anticoagulation.  He was admitted again with AFib/Flutter with RVR with associated Troponin elevation in 10/2017 and he was started on Dofetilide.  He was last seen by Dr. Acie Fredrickson in 02/2018.  Today, he is seen with the Doximity application.  He has had a couple of episodes of rapid palpitations.  These are fairly brief lasting seconds to a couple of minutes.  He does have some anginal symptoms associated with this.  He has had rare episodes of exertional angina (for example while mowing the lawn).  He has shortness of breath associated with this.  He has not had to take nitroglycerin.  He does not feel that these episodes are increasing in intensity or frequency.  He denies orthopnea, PND or edema.  He denies syncope.  CHADS2-VASc=6 (DM, HTN, CAD, TIA x 2, age x 1).     The patient does not have symptoms concerning for COVID-19 infection (fever, chills, cough, or new shortness of breath).    Past Medical History:  Diagnosis Date  . Atrial fibrillation with RVR ([place]) 10/27/2017  . Back pain   . Basilar artery stenosis    on chronic Plavix  . Cerebral vascular disease    with prior TIA's; followed by Dr. Erling Cruz  . Depression   . Diabetes mellitus    on insulin  . History of kidney stones   . History of renal calculi   . Hyperlipidemia   . Hypertension   . Ischemic heart disease    prior PCI to RCA in 1989.  S/P PCI to LAD and OM in 1992. S/P PCI to first DX in 2000. S/P CABG x 3 in May 2011  . OSA (obstructive sleep apnea) 01/11/2018  . Peripheral neuropathy   . Pneumonia Sept 2012  . Sleep apnea    Past Surgical History:  Procedure Laterality Date  . ANGIOPLASTY  1989   right coronary artery  . ANGIOPLASTY  1992   LAD and OM  . ANGIOPLASTY  1998   First DX  . CORONARY ARTERY BYPASS GRAFT  11/12/2009   LIMA to LAD, SVG to OM and SVG to RCA  . CORONARY STENT PLACEMENT  2000   Stent to  LAD/Circumflex with angioplasty to first diagonal   . LEFT HEART CATH AND CORS/GRAFTS ANGIOGRAPHY N/A 10/13/2017   Procedure: LEFT HEART CATH AND CORS/GRAFTS ANGIOGRAPHY;  Surgeon: Nelva Bush, MD;  Location: [facility];  Service: Cardiovascular;  Laterality: N/A;     Current Meds  Medication Sig  . Artificial Tear Ointment (DRY EYES OP) Place 1 drop into both eyes daily. Gen teal  . clopidogrel (PLAVIX) 75 MG tablet TAKE 1 TABLET BY MOUTH EVERY DAY  . dofetilide (TIKOSYN) 125 MCG capsule Take 1 capsule (125 mcg total) by mouth 2 (two) times daily.  Marland Kitchen ezetimibe (ZETIA) 10 MG tablet TAKE 1 TABLET BY MOUTH EVERY DAY  . fluticasone (FLONASE) 50 MCG/ACT nasal spray Place 1 spray into both nostrils daily as needed for allergies.   . Hypertonic Nasal Wash (SINUS RINSE BOTTLE KIT NA) Place 1 spray into the nose daily as needed (congrestion).   . insulin glargine (LANTUS) 100 UNIT/ML injection Inject 35 Units into the skin 2 (two) times daily.   Marland Kitchen loratadine (CLARITIN) 10 MG tablet Take 10 mg by mouth daily as needed for allergies.   . metoprolol tartrate (LOPRESSOR) 25 MG tablet Take 0.5 tablets (12.5 mg total) by mouth 2 (two) times daily.  . nitroGLYCERIN (NITROSTAT) 0.4 MG SL tablet Place 1 tablet (0.4 mg total) under the tongue every 5 (five) minutes as needed for chest pain.  Marland Kitchen olmesartan (BENICAR) 20 MG tablet Take 1 tablet by mouth daily.   . rosuvastatin (CRESTOR) 20 MG tablet Take 1 tablet (20 mg total) by mouth daily.  Alveda Reasons 15 MG TABS tablet TAKE 1 TABLET (15 MG TOTAL) BY MOUTH DAILY WITH SUPPER.  . [DISCONTINUED] Omega-3 Fatty Acids (FISH OIL) 1000 MG CAPS Take 1,000 mg by mouth 2 (two) times daily.     Allergies:   Codeine; Lisinopril; Nsaids; and Latex   Social History   Tobacco Use  . Smoking status: Former Smoker    Packs/day: 1.00    Years: 54.00    Pack years: 54.00    Types: Cigarettes    Last attempt to quit: 10/19/2009    Years since quitting: 8.9  .  Smokeless tobacco: Never Used  Substance Use Topics  . Alcohol use: No  . Drug use: No     Family Hx: The patient's family history includes Asthma in his brother; Heart attack in his father; Heart disease in his father; Hypertension in his father; Stroke in an other family member.  ROS:   Please see the history of present illness.    No bleeding issues. All other systems reviewed and are negative.   Prior CV studies:   The following studies were reviewed today:  Carotid US 03/16/18 Bilat ICA 1-39 L subclavian flow disturbed Repeat in 1 year  ABIs 11/07/2017 Final Interpretation: Right: Resting right  ankle-brachial index is within normal range. No evidence of significant right lower extremity arterial disease. The right toe-brachial index is abnormal. Left: Resting left ankle-brachial index indicates mild left lower extremity arterial disease. The left toe-brachial index is abnormal.  Cardiac catheterization 10/13/2017 LM distal 50 LAD proximal 100-CTO, mid to distal 50; D1 ostial 75 LCx mid 100; OM1 100; OM2 95 RCA proximal 100-CTO; RPDA 70 SVG-RPDA patent SVG-OM 2 patent LIMA-LAD patent   Echo 10/12/2017 Mild LVH, EF 55-60, normal wall motion  Carotid US 03/16/2017 Bilateral ICA 1-39 Follow-up 1 year  Labs/Other Tests and Data Reviewed:    EKG:  No ECG reviewed.  Recent Labs: 10/11/2017: B Natriuretic Peptide 140.6 10/27/2017: TSH 0.903 10/30/2017: Hemoglobin 14.6; Platelets 248 03/16/2018: ALT 24; BUN 23; Creatinine, Ser 1.47; Magnesium 2.0; Potassium 4.4; Sodium 140   Recent Lipid Panel Lab Results  Component Value Date/Time   CHOL 127 03/16/2018 09:09 AM   TRIG 165 (H) 03/16/2018 09:09 AM   HDL 45 03/16/2018 09:09 AM   CHOLHDL 2.8 03/16/2018 09:09 AM   CHOLHDL 4.1 10/12/2017 04:11 AM   LDLCALC 49 03/16/2018 09:09 AM         Wt Readings from Last 3 Encounters:  09/27/18 175 lb (79.4 kg)  03/16/18 184 lb (83.5 kg)  12/26/17 186 lb (84.4 kg)      Objective:    Vital Signs:  BP 123/63   Pulse 63   Ht '5\' 8"'  (1.727 m)   Wt 175 lb (79.4 kg)   BMI 26.61 kg/m    General - Well nourished, well developed male in no  acute distress. Respiratory -he exhibits normal respiratory effort Neurologic -he is alert and oriented Psych -he is in good spirits  ASSESSMENT & PLAN:    Coronary artery disease involving native coronary artery of native heart with angina pectoris ([place]) History of CABG in 2011.  Cardiac catheterization in April 2019 demonstrated patent bypass grafts.  There was moderate disease in the diagonal and RPDA which is treated medically.  He has had some episodes of angina.  These are rare.  He also has some angina with rapid ventricular rate with atrial fibrillation.  His symptoms are not frequent enough to warrant the addition of nitrates.  He does not take PDE-5 inhibitors.  I have encouraged him to contact us if his symptoms should worsen and to take as needed nitroglycerin.  We could possibly place him on Imdur 15 mg daily if his symptoms should worsen versus arranging an in person visit in the office.  Continue clopidogrel, rosuvastatin, metoprolol.  Persistent atrial fibrillation He has rare episodes of atrial fibrillation.  These are brief in duration.  Continue dofetilide.  Creatinine clearance is 48.  Continue current dose of rivaroxaban.  I have recommended that he take an extra metoprolol should he have prolonged symptoms.  Essential (primary) hypertension The patient's blood pressure is controlled on his current regimen.  Continue current therapy.   Chronic kidney disease (CKD), stage III (moderate) (HCC) Followed by nephrology.  Most recent creatinine stable.  Educated About Covid-19 Virus Infection The signs and symptoms of COVID-19 were discussed with the patient and how to seek care for testing (follow up with PCP or arrange E-visit).  The importance of social distancing was discussed today.  Time:   Today, I  have spent 20 minutes with the patient with telehealth technology discussing the above problems.     Medication Adjustments/Labs and Tests Ordered: Current medicines are reviewed at length with the  patient today.  Concerns regarding medicines are outlined above.  Tests Ordered: No orders of the defined types were placed in this encounter.  Medication Changes: No orders of the defined types were placed in this encounter.   Disposition:  Follow up in 3 month(s)  Signed, Richardson Dopp, PA-C  09/27/2018 12:37 PM    [facility]

## 2018-09-26 ENCOUNTER — Telehealth: Payer: Self-pay | Admitting: Physician Assistant

## 2018-09-26 NOTE — Telephone Encounter (Signed)
°  4/7 :   Spoke with patient. Tried to help patient with MyChart. Patient said he has no way of trying to login to [facility]. Patient was not able to download Webex app. Patient has smartphone, but no knowledge of using smartphone other than receiving phone calls. Patient agreed to phone visit.

## 2018-09-27 ENCOUNTER — Other Ambulatory Visit: Payer: Self-pay

## 2018-09-27 ENCOUNTER — Encounter: Payer: Self-pay | Admitting: Physician Assistant

## 2018-09-27 ENCOUNTER — Telehealth (INDEPENDENT_AMBULATORY_CARE_PROVIDER_SITE_OTHER): Payer: Medicare Other | Admitting: Physician Assistant

## 2018-09-27 ENCOUNTER — Telehealth: Payer: Self-pay

## 2018-09-27 VITALS — BP 123/63 | HR 63 | Ht 68.0 in | Wt 175.0 lb

## 2018-09-27 DIAGNOSIS — I25119 Atherosclerotic heart disease of native coronary artery with unspecified angina pectoris: Secondary | ICD-10-CM

## 2018-09-27 DIAGNOSIS — I4819 Other persistent atrial fibrillation: Secondary | ICD-10-CM

## 2018-09-27 DIAGNOSIS — I10 Essential (primary) hypertension: Secondary | ICD-10-CM

## 2018-09-27 DIAGNOSIS — N183 Chronic kidney disease, stage 3 unspecified: Secondary | ICD-10-CM

## 2018-09-27 DIAGNOSIS — Z7189 Other specified counseling: Secondary | ICD-10-CM

## 2018-09-27 NOTE — Telephone Encounter (Signed)
Virtual Visit Pre-Appointment Phone Call  Steps For Call:  1. Confirm consent - "In the setting of the current Covid19 crisis, you are scheduled for a (phone or video) visit with your provider on (date) at (time).  Just as we do with many in-office visits, in order for you to participate in this visit, we must obtain consent.  If you'd like, I can send this to your mychart (if signed up) or email for you to review.  Otherwise, I can obtain your verbal consent now.  All virtual visits are billed to your insurance company just like a normal visit would be.  By agreeing to a virtual visit, we'd like you to understand that the technology does not allow for your provider to perform an examination, and thus may limit your provider's ability to fully assess your condition.  Finally, though the technology is pretty good, we cannot assure that it will always work on either your or our end, and in the setting of a video visit, we may have to convert it to a phone-only visit.  In either situation, we cannot ensure that we have a secure connection.  Are you willing to proceed?"  2. Give patient instructions for WebEx download to smartphone as below if video visit  3. Advise patient to be prepared with any vital sign or heart rhythm information, their current medicines, and a piece of paper and pen handy for any instructions they may receive the day of their visit  4. Inform patient they will receive a phone call 15 minutes prior to their appointment time (may be from unknown caller ID) so they should be prepared to answer  5. Confirm that appointment type is correct in Epic appointment notes (video vs telephone)    TELEPHONE CALL NOTE  Marvin Salinas has been deemed a candidate for a follow-up tele-health visit to limit community exposure during the Covid-19 pandemic. I spoke with the patient via phone to ensure availability of phone/video source, confirm preferred email & phone number, and discuss  instructions and expectations.  I reminded Marvin Salinas to be prepared with any vital sign and/or heart rhythm information that could potentially be obtained via home monitoring, at the time of his visit. I reminded Marvin Salinas to expect a phone call at the time of his visit if his visit.  Did the patient verbally acknowledge consent to treatment? Yes  Mendel Ryder, [place] 09/27/2018 10:45 AM   DOWNLOADING THE [place] TO SMARTPHONE  - If Apple, go to [employer] and type in WebEx in the search bar. [place] [employer], the blue/green circle. The app is free but as with any other app downloads, their phone may require them to verify saved payment information or Apple password. The patient does NOT have to create an account.  - If Android, ask patient to go to [employer] and type in WebEx in the search bar. [place] [employer], the blue/green circle. The app is free but as with any other app downloads, their phone may require them to verify saved payment information or Android password. The patient does NOT have to create an account.   CONSENT FOR TELE-HEALTH VISIT - PLEASE REVIEW  I hereby voluntarily request, consent and authorize [facility] and its employed or contracted physicians, physician assistants, nurse practitioners or other licensed health care professionals (the Practitioner), to provide me with telemedicine health care services (the "Services") as deemed necessary by the treating Practitioner.  I acknowledge and consent to receive the Services by the Practitioner via telemedicine. I understand that the telemedicine visit will involve communicating with the Practitioner through live audiovisual communication technology and the disclosure of certain medical information by electronic transmission. I acknowledge that I have been given the opportunity to request an in-person assessment or other available alternative prior to the telemedicine visit and am  voluntarily participating in the telemedicine visit.  I understand that I have the right to withhold or withdraw my consent to the use of telemedicine in the course of my care at any time, without affecting my right to future care or treatment, and that the Practitioner or I may terminate the telemedicine visit at any time. I understand that I have the right to inspect all information obtained and/or recorded in the course of the telemedicine visit and may receive copies of available information for a reasonable fee.  I understand that some of the potential risks of receiving the Services via telemedicine include:  Marland Kitchen Delay or interruption in medical evaluation due to technological equipment failure or disruption; . Information transmitted may not be sufficient (e.g. poor resolution of images) to allow for appropriate medical decision making by the Practitioner; and/or  . In rare instances, security protocols could fail, causing a breach of personal health information.  Furthermore, I acknowledge that it is my responsibility to provide information about my medical history, conditions and care that is complete and accurate to the best of my ability. I acknowledge that Practitioner's advice, recommendations, and/or decision may be based on factors not within their control, such as incomplete or inaccurate data provided by me or distortions of diagnostic images or specimens that may result from electronic transmissions. I understand that the practice of medicine is not an exact science and that Practitioner makes no warranties or guarantees regarding treatment outcomes. I acknowledge that I will receive a copy of this consent concurrently upon execution via email to the email address I last provided but may also request a printed copy by calling the office of [place].    I understand that my insurance will be billed for this visit.   I have read or had this consent read to me. . I understand the  contents of this consent, which adequately explains the benefits and risks of the Services being provided via telemedicine.  . I have been provided ample opportunity to ask questions regarding this consent and the Services and have had my questions answered to my satisfaction. . I give my informed consent for the services to be provided through the use of telemedicine in my medical care  By participating in this telemedicine visit I agree to the above.

## 2018-09-27 NOTE — Patient Instructions (Signed)
Medication Instructions:  No changes  If you need a refill on your cardiac medications before your next appointment, please call your pharmacy.   Lab work: None   If you have labs (blood work) drawn today and your tests are completely normal, you will receive your results only by: Marland Kitchen MyChart Message (if you have MyChart) OR . A paper copy in the mail If you have any lab test that is abnormal or we need to change your treatment, we will call you to review the results.  Testing/Procedures: None   Follow-Up: At [facility], you and your health needs are our priority.  As part of our continuing mission to provide you with exceptional heart care, we have created designated Provider Care Teams.  These Care Teams include your primary Cardiologist (physician) and Advanced Practice Providers (APPs -  Physician Assistants and Nurse Practitioners) who all work together to provide you with the care you need, when you need it. You will need a follow up appointment in:  3 months.  Please call our office 2 months in advance to schedule this appointment.  You may see Mertie Moores, MD or Richardson Dopp, PA-C  Any Other Special Instructions Will Be Listed Below (If Applicable).  1. Take Nitroglycerin as needed for chest pain. 2. Take an extra 1/2 tablet of Metoprolol if you have prolonged, rapid palpitations. 3. Call us if you feel you chest pain or palpitations are getting worse.

## 2018-09-27 NOTE — Telephone Encounter (Signed)
    TELEPHONE CALL NOTE  Marvin Salinas has been deemed a candidate for a follow-up tele-health visit to limit community exposure during the Covid-19 pandemic. I spoke with the patient via phone to ensure availability of phone/video source.  Did the patient verbally acknowledge consent to treatment? YES  Richardson Dopp, PA-C 09/27/2018 12:34 PM   CONSENT FOR TELE-HEALTH VISIT  I hereby voluntarily request, consent and authorize [facility] and its employed or contracted physicians, physician assistants, nurse practitioners or other licensed health care professionals (the Practitioner), to provide me with telemedicine health care services (the "Services") as deemed necessary by the treating Practitioner. I acknowledge and consent to receive the Services by the Practitioner via telemedicine. I understand that the telemedicine visit will involve communicating with the Practitioner through live audiovisual communication technology and the disclosure of certain medical information by electronic transmission. I acknowledge that I have been given the opportunity to request an in-person assessment or other available alternative prior to the telemedicine visit and am voluntarily participating in the telemedicine visit.  I understand that I have the right to withhold or withdraw my consent to the use of telemedicine in the course of my care at any time, without affecting my right to future care or treatment, and that the Practitioner or I may terminate the telemedicine visit at any time. I understand that I have the right to inspect all information obtained and/or recorded in the course of the telemedicine visit and may receive copies of available information for a reasonable fee.  I understand that some of the potential risks of receiving the Services via telemedicine include:  Marland Kitchen Delay or interruption in medical evaluation due to technological equipment failure or disruption; . Information transmitted may  not be sufficient (e.g. poor resolution of images) to allow for appropriate medical decision making by the Practitioner; and/or  . In rare instances, security protocols could fail, causing a breach of personal health information.  Furthermore, I acknowledge that it is my responsibility to provide information about my medical history, conditions and care that is complete and accurate to the best of my ability. I acknowledge that Practitioner's advice, recommendations, and/or decision may be based on factors not within their control, such as incomplete or inaccurate data provided by me or distortions of diagnostic images or specimens that may result from electronic transmissions. I understand that the practice of medicine is not an exact science and that Practitioner makes no warranties or guarantees regarding treatment outcomes. I acknowledge that I will receive a copy of this consent concurrently upon execution via email to the email address I last provided but may also request a printed copy by calling the office of [place].    I understand that my insurance will be billed for this visit.   I have read or had this consent read to me. . I understand the contents of this consent, which adequately explains the benefits and risks of the Services being provided via telemedicine.  . I have been provided ample opportunity to ask questions regarding this consent and the Services and have had my questions answered to my satisfaction. . I give my informed consent for the services to be provided through the use of telemedicine in my medical care  By participating in this telemedicine visit I agree to the above.

## 2018-11-02 ENCOUNTER — Other Ambulatory Visit: Payer: Self-pay | Admitting: Cardiovascular Disease

## 2018-12-14 NOTE — Telephone Encounter (Signed)
This encounter was created in error - please disregard.

## 2018-12-15 ENCOUNTER — Other Ambulatory Visit: Payer: Self-pay | Admitting: Pharmacist

## 2018-12-15 MED ORDER — RIVAROXABAN 15 MG PO TABS: 15.0000 mg | ORAL_TABLET | Freq: Every day | ORAL | 1 refills | Status: DC

## 2018-12-15 NOTE — Progress Notes (Signed)
Age 73, weight 79.4kg, SCr 1.47 on 03/16/18, CrCl 50.45mL/min, SCr traditionally higher - ok to refill for 15mg  dosing since very borderling CrCl. Last OV 09/2018, afib indication

## 2018-12-28 ENCOUNTER — Other Ambulatory Visit: Payer: Self-pay | Admitting: Cardiovascular Disease

## 2019-01-03 ENCOUNTER — Other Ambulatory Visit: Payer: Self-pay | Admitting: Physician Assistant

## 2019-01-03 DIAGNOSIS — E785 Hyperlipidemia, unspecified: Secondary | ICD-10-CM

## 2019-01-03 DIAGNOSIS — I25810 Atherosclerosis of coronary artery bypass graft(s) without angina pectoris: Secondary | ICD-10-CM

## 2019-01-24 ENCOUNTER — Telehealth: Payer: Self-pay | Admitting: Family Medicine

## 2019-01-24 ENCOUNTER — Ambulatory Visit (INDEPENDENT_AMBULATORY_CARE_PROVIDER_SITE_OTHER): Payer: Medicare Other | Admitting: Family Medicine

## 2019-01-24 ENCOUNTER — Encounter: Payer: Self-pay | Admitting: Family Medicine

## 2019-01-24 ENCOUNTER — Other Ambulatory Visit: Payer: Self-pay

## 2019-01-24 VITALS — BP 122/86 | HR 81 | Temp 98.0°F | Resp 16 | Ht 68.0 in | Wt 176.0 lb

## 2019-01-24 DIAGNOSIS — I739 Peripheral vascular disease, unspecified: Secondary | ICD-10-CM

## 2019-01-24 DIAGNOSIS — E1122 Type 2 diabetes mellitus with diabetic chronic kidney disease: Secondary | ICD-10-CM | POA: Diagnosis not present

## 2019-01-24 DIAGNOSIS — E785 Hyperlipidemia, unspecified: Secondary | ICD-10-CM

## 2019-01-24 DIAGNOSIS — I259 Chronic ischemic heart disease, unspecified: Secondary | ICD-10-CM

## 2019-01-24 DIAGNOSIS — I4891 Unspecified atrial fibrillation: Secondary | ICD-10-CM

## 2019-01-24 DIAGNOSIS — I679 Cerebrovascular disease, unspecified: Secondary | ICD-10-CM

## 2019-01-24 DIAGNOSIS — N183 Chronic kidney disease, stage 3 unspecified: Secondary | ICD-10-CM

## 2019-01-24 DIAGNOSIS — I252 Old myocardial infarction: Secondary | ICD-10-CM

## 2019-01-24 DIAGNOSIS — Z7689 Persons encountering health services in other specified circumstances: Secondary | ICD-10-CM

## 2019-01-24 DIAGNOSIS — I10 Essential (primary) hypertension: Secondary | ICD-10-CM

## 2019-01-24 DIAGNOSIS — Z951 Presence of aortocoronary bypass graft: Secondary | ICD-10-CM

## 2019-01-24 DIAGNOSIS — I25119 Atherosclerotic heart disease of native coronary artery with unspecified angina pectoris: Secondary | ICD-10-CM

## 2019-01-24 DIAGNOSIS — E1149 Type 2 diabetes mellitus with other diabetic neurological complication: Secondary | ICD-10-CM

## 2019-01-24 DIAGNOSIS — Z794 Long term (current) use of insulin: Secondary | ICD-10-CM

## 2019-01-24 DIAGNOSIS — G4733 Obstructive sleep apnea (adult) (pediatric): Secondary | ICD-10-CM

## 2019-01-24 DIAGNOSIS — Z8673 Personal history of transient ischemic attack (TIA), and cerebral infarction without residual deficits: Secondary | ICD-10-CM | POA: Diagnosis not present

## 2019-01-24 DIAGNOSIS — L989 Disorder of the skin and subcutaneous tissue, unspecified: Secondary | ICD-10-CM

## 2019-01-24 DIAGNOSIS — I4729 Other ventricular tachycardia: Secondary | ICD-10-CM

## 2019-01-24 DIAGNOSIS — I4892 Unspecified atrial flutter: Secondary | ICD-10-CM

## 2019-01-24 DIAGNOSIS — I472 Ventricular tachycardia: Secondary | ICD-10-CM

## 2019-01-24 LAB — TSH: TSH: 0.6 u[IU]/mL (ref 0.35–4.50)

## 2019-01-24 LAB — HEMOGLOBIN A1C: Hgb A1c MFr Bld: 11 % — ABNORMAL HIGH (ref 4.6–6.5)

## 2019-01-24 MED ORDER — OLMESARTAN MEDOXOMIL 20 MG PO TABS: 20.0000 mg | ORAL_TABLET | Freq: Every day | ORAL | 1 refills | Status: DC

## 2019-01-24 NOTE — Progress Notes (Signed)
Patient ID: Marvin Salinas, male  DOB: Mar 28, 1946, 73 y.o.   MRN: [id number] Patient Care Team    Relationship Specialty Notifications Start End  Ma Hillock, DO PCP - General Family Medicine  01/24/19   Chesley Mires, MD Consulting Physician Pulmonary Disease  01/26/19   Laurena Slimmer, MD Referring Physician Nephrology  01/26/19   [name], Wonda Cheng, MD Consulting Physician Cardiology  01/26/19   Constance Haw, MD Consulting Physician Cardiology  01/26/19   Calvert Cantor, MD Consulting Physician Ophthalmology  01/26/19   Garvin Fila, MD Consulting Physician Neurology  01/26/19   Lennon Alstrom, MD Consulting Physician Neurology  01/26/19     Chief Complaint  Patient presents with  . Establish Care    Subjective:  Marvin Salinas is a 73 y.o.  male present for new patient establishment. All past medical history, surgical history, allergies, family history, immunizations, medications and social history were updated in the electronic medical record today. All recent labs, ED visits and hospitalizations within the last year were reviewed.  Type 2 diabetes mellitus with stage 3 chronic kidney disease, with long-term current use of insulin (HCC) Pt reports compliance with Lantus 35 units twice daily.  He states he was also tried on Ozempic last month by another provider, and he had extremely elevated blood sugars so he discontinued the use.  Denies new onset numbness, tingling of extremities, hypo/hyperglycemic events or non-healing wounds.  He has chronic neuropathy of his lower extremities.  Pt reports his fasting blood glucose ranges over the last 1 to 2 weeks average 155.   Has been on gabapentin in the past, felt the dose was too high and he had side effects. PNA series: Pneumonia series completed Flu shot: Up-to-date 2019 (recommneded yearly) Foot exam: Referred to podiatry Eye exam: 03/25/2018, Dr. Bing Plume A1c: 8.9>>> 13.7 (08/22/2018)  Skin lesions Patient reports he has multiple  rough skin lesion on his forehead, right temple and his right ear.  He has never had a history of skin cancer.  Atrial fibrillation with RVR (HCC)/Essential hypertension/Ischemic heart disease/ S/P CABG x 3/Paroxysmal atrial flutter (HCC)/PAD (peripheral artery disease) (HCC)/Nonsustained ventricular tachycardia (HCC)/HLD/NSTEMI/h/o stroke/CKD 3 Patient has a history of severe 3-vessel coronary artery disease with chronic total occlusions of the proximal/mid LAD, mid/distal left circumflex/and proximal RCA.  Moderate caliber D1 and distal RPDA demonstrate 70-80% stenosis.  Moderately elevated left ventricular filling pressure. Pt reports compliance with Sherlynn Stalls, Xarelto, Benicar 20, metoprolol 12.5 mg twice daily, Zetia, tikosyn and Plavix. Blood pressures ranges at home WNL. Patient denies chest pain, shortness of breath or lower extremity edema. Pt is prescribed Plavix and Xarelto. Pt is prescribed statin. At one point he was prescribed tricor, not on current list today. BMP: 01/22/2019 reviewed in care everywhere tab creatinine 1.55, GFR 44.  Baseline creatinine 1.55-1.8 and baseline GFR 36-44. CBC: 08/22/2018 CBC normal 08/22/2018 lipids with elevated triglycerides. Diet: Heart healthy-low-sodium diet encouraged RF: Hypertension, hyperlipidemia, smoker, STEMI, stroke, diabetes, family history, CAD, CVD, PAD, bilateral carotid artery stenosis   ECHO 10/12/2017: Study Conclusions - Left ventricle: The cavity size was normal. Wall thickness was   increased in a pattern of mild LVH. Systolic function was normal.   The estimated ejection fraction was in the range of 55% to 60%.   Wall motion was normal; there were no regional wall motion   abnormalities. Left ventricular diastolic function parameters   were normal. Impressions: - Normal LV systolic function; mild diastolic dysfunction; mild  LVH.  Vas US Carotid Result Date: 03/17/2018 Interpretation:  Right Carotid: Velocities in the right ICA  are consistent with a 1-39% stenosis.                Non-hemodynamically significant plaque <50% noted in the CCA. Left Carotid: Velocities in the left ICA are consistent with a 1-39% stenosis.               Non-hemodynamically significant plaque noted in the CCA. The ECA               appears >50% stenosed. Vertebrals:   Bilateral vertebral arteries demonstrate antegrade flow.  Subclavians: Left subclavian artery flow was disturbed. Normal flow hemodynamics              were seen in the right subclavian artery.  Suggest follow up study in 12 months due to excessive plaque   Carotid duplex 09/2013: 40-59% bilateral carotid stenosis  Depression screen [facility] 2/9 01/24/2019  Decreased Interest 0  Down, Depressed, Hopeless 0  PHQ - 2 Score 0  Altered sleeping 0  Tired, decreased energy 0  Change in appetite 0  Feeling bad or failure about yourself  0  Trouble concentrating 0  Moving slowly or fidgety/restless 0  Suicidal thoughts 0  PHQ-9 Score 0  Difficult doing work/chores Not difficult at all   No flowsheet data found.   Current Exercise Habits: The patient does not participate in regular exercise at present(does yard work and gardens) Exercise limited by: None identified Fall Risk  01/24/2019  Falls in the past year? 0  Number falls in past yr: 0  Injury with Fall? 0    Immunization History  Administered Date(s) Administered  . Influenza Split 05/08/2013, 03/28/2017  . Influenza, High Dose Seasonal PF 05/08/2013, 03/28/2017, 05/02/2018  . Influenza,inj,Quad PF,6+ Mos 04/21/2016  . Pneumococcal Conjugate-13 07/13/2016  . Pneumococcal Polysaccharide-23 02/21/2018  . Zoster 06/22/2015    No exam data present  Past Medical History:  Diagnosis Date  . Acute kidney injury superimposed on chronic kidney disease ([place]) 10/12/2017  . Atrial fibrillation with RVR ([place]) 10/27/2017  . Back pain   . Basilar artery stenosis    on chronic Plavix  . Benign prostatic hypertrophy without  urinary obstruction 04/17/2014  . Cerebral vascular disease    with prior TIA's; followed by Dr. Erling Cruz  . Depression, neurotic 11/21/2013  . Diabetes mellitus    on insulin  . Enthesopathy of ankle and tarsus 09/04/2007   Overview:  Metatarsalgia   . History of renal calculi   . Hyperlipidemia   . Hypertension   . Ischemic heart disease    prior PCI to RCA in 1989. S/P PCI to LAD and OM in 1992. S/P PCI to first DX in 2000. S/P CABG x 3 in May 2011  . OSA (obstructive sleep apnea) 01/11/2018   AHI 18.1 and SaO2 low 73%  . Peripheral neuropathy   . Pneumonia Sept 2012  . Stroke ([place]) 04/16/2001   small right cerebellar infarct on 04/16/2001 at that time he was found to have proximal left vertebral artery, proximal left common carotid artery and both external carotid artery stenosis as well as intracranial stenosis involving mid basilar artery- 07/2011 add questionable TIA   Allergies  Allergen Reactions  . Codeine Nausea And Vomiting  . Lisinopril Cough    Other reaction(s): Cough (ALLERGY/intolerance)  . Nsaids     Other reaction(s): Other (See Comments) CKD Other reaction(s): Other CKD  . Latex  Hives   Past Surgical History:  Procedure Laterality Date  . ANGIOPLASTY  1989   right coronary artery  . ANGIOPLASTY  1992   LAD and OM  . ANGIOPLASTY  1998   First DX  . BRAIN SURGERY     on prior records  . CORONARY ARTERY BYPASS GRAFT  11/12/2009   LIMA to LAD, SVG to OM and SVG to RCA  . CORONARY STENT PLACEMENT  2000   Stent to LAD/Circumflex with angioplasty to first diagonal   . LEFT HEART CATH AND CORS/GRAFTS ANGIOGRAPHY N/A 10/13/2017   Procedure: LEFT HEART CATH AND CORS/GRAFTS ANGIOGRAPHY;  Surgeon: Nelva Bush, MD;  Location: [facility];  Service: Cardiovascular;  Laterality: N/A;   Family History  Problem Relation Age of Onset  . Depression Mother   . Early death Mother   . Kidney disease Mother   . Ovarian cancer Mother   . Hypertension Father   .  Heart disease Father   . Heart attack Father   . Asthma Brother   . Diabetes Brother   . Stroke Other        Uncle  . Diabetes Sister    Social History   Social History Narrative   Marital status/children/pets: divorced.   Education/employment: retired, Patient has his GED. 9th grade education.    Safety:      -smoke alarm in the home:Yes     - wears seatbelt: Yes   Patient is right handed.   Patient does not drink any caffeine.    Allergies as of 01/24/2019      Reactions   Codeine Nausea And Vomiting   Lisinopril Cough   Other reaction(s): Cough (ALLERGY/intolerance)   Nsaids    Other reaction(s): Other (See Comments) CKD Other reaction(s): Other CKD   Latex Hives      Medication List       Accurate as of January 24, 2019 11:59 PM. If you have any questions, ask your nurse or doctor.        STOP taking these medications   DRY EYES OP Stopped by: Howard Pouch, DO   fluticasone 50 MCG/ACT nasal spray Commonly known as: FLONASE Stopped by: Howard Pouch, DO   loratadine 10 MG tablet Commonly known as: CLARITIN Stopped by: Howard Pouch, DO   SINUS RINSE BOTTLE KIT NA Stopped by: Howard Pouch, DO     TAKE these medications   clopidogrel 75 MG tablet Commonly known as: PLAVIX TAKE 1 TABLET BY MOUTH EVERY DAY   dofetilide 125 MCG capsule Commonly known as: TIKOSYN Take 1 capsule (125 mcg total) by mouth 2 (two) times daily.   ezetimibe 10 MG tablet Commonly known as: ZETIA TAKE 1 TABLET BY MOUTH EVERY DAY   insulin glargine 100 UNIT/ML injection Commonly known as: LANTUS Inject 0.4 mLs (40 Units total) into the skin 2 (two) times daily. What changed: how much to take Changed by: Howard Pouch, DO   metoprolol tartrate 25 MG tablet Commonly known as: LOPRESSOR TAKE 0.5 TABLETS (12.5 MG TOTAL) BY MOUTH 2 (TWO) TIMES DAILY.   nitroGLYCERIN 0.4 MG SL tablet Commonly known as: NITROSTAT Place 1 tablet (0.4 mg total) under the tongue every 5 (five)  minutes as needed for chest pain.   olmesartan 20 MG tablet Commonly known as: BENICAR Take 1 tablet (20 mg total) by mouth daily.   Rivaroxaban 15 MG Tabs tablet Commonly known as: Xarelto Take 1 tablet (15 mg total) by mouth daily with supper.  rosuvastatin 20 MG tablet Commonly known as: CRESTOR TAKE 1 TABLET BY MOUTH EVERY DAY       All past medical history, surgical history, allergies, family history, immunizations andmedications were updated in the EMR today and reviewed under the history and medication portions of their EMR.    Recent Results (from the past 2160 hour(s))  TSH     Status: None   Collection Time: 01/24/19 11:35 AM  Result Value Ref Range   TSH 0.60 0.35 - 4.50 uIU/mL  Hemoglobin A1c     Status: Abnormal   Collection Time: 01/24/19 11:35 AM  Result Value Ref Range   Hgb A1c MFr Bld 11.0 (H) 4.6 - 6.5 %    Comment: Glycemic Control Guidelines for People with Diabetes:Non Diabetic:  <6%Goal of Therapy: <7%Additional Action Suggested:  >8%      ROS: 14 pt review of systems performed and negative (unless mentioned in an HPI)  Objective: BP 122/86   Pulse 81   Temp 98 F (36.7 C) (Tympanic)   Resp 16   Ht _0  (1.727 m)   Wt 176 lb (79.8 kg)   SpO2 98%   BMI 26.76 kg/m  Gen: Afebrile. No acute distress. Nontoxic in appearance, well-developed, well-nourished, pleasant, mildly overweight Caucasian male. HENT: AT. [place]. MMM, no cough on exam, no hoarseness on exam. Eyes:Pupils Equal Round Reactive to light, Extraocular movements intact,  Conjunctiva without redness, discharge or icterus. Neck/lymp/endocrine: Supple, no lymphadenopathy, no thyromegaly CV: RRR no murmur, no edema, +2/4 P posterior tibialis pulses.  Bilateral carotid bruits present. No JVD. Chest: CTAB, no wheeze, rhonchi or crackles.  Normal respiratory effort.  Good air movement. Skin: No rashes, purpura or petechiae. Warm and well-perfused. Skin intact.  Multiple AKs across right temple,  forehead and right ear with larger round 1 cm raised rough skin lesion Neuro/Msk:  Normal gait. PERLA. EOMi. Alert. Oriented x3.  Cranial nerves II through XII intact. Muscle strength 5/5 upper/lower extremity. DTRs equal bilaterally. Psych: Normal affect, dress and demeanor. Normal speech. Normal thought content and judgment.  Assessment/plan: Marvin Salinas is a 73 y.o. male present for est care Type 2 diabetes mellitus with stage 3 chronic kidney disease, with long-term current use of insulin ([place]) By EMR review his A1c jumped rather significantly from 8.9-13.7 in March.  He denies any major life changes at that time that could contribute to his elevation. -Continue Lantus 35 units twice daily for now and await current A1c.  Given his cardiac history and his CKD oral options are limited.  Could consider adding glipizide depending upon results of A1c after titrating up on Lantus. -Complains of diabetic neuropathy/hip pain states he has gabapentin at home but felt the dose was too high.  Encouraged him to try gabapentin 100 mg nightly of his already prescribed medicines at home to see if he can tolerate the lower dose. -Medicines tried: Lantus and Ozempic (side effects) -We will touch base on more specifics of his diet and if he has attended diabetes education/nutrition - TSH - Hemoglobin A1c - Ambulatory referral to Podiatry PNA series: Pneumonia series completed Flu shot: Up-to-date 2019 (recommneded yearly) Foot exam: Referred to podiatry Eye exam: 03/25/2018, Dr. Bing Plume Follow-up every 3 months  Skin lesions Multiple AK's of the scalp and forehead as well as AK versus SCC right ear. - Ambulatory referral to Dermatology  OSA diagnosis: Pt did not report use of CPAP today.   Atrial fibrillation with RVR (HCC)/Essential hypertension/Ischemic heart disease/ S/P CABG  x 3/Paroxysmal atrial flutter (HCC)/PAD (peripheral artery disease) (HCC)/Nonsustained ventricular tachycardia  (HCC)/HLD/NSTEMI/h/o stroke/ -BP is stable on current regimen. -Continue Crestor 20 mg daily and Zetia 10 mg.  Prescription provided by cardiology. - Continue Xarelto and Plavix.  Prescription provided by cardiology. - Continue Benicar 20 mg daily, metoprolol tart take 12.5 mg twice daily and tikosyn prescribed by cardiology. -Encouraged him to stop smoking. -Continue routine follow-ups with cardiology  Chronic kidney disease (CKD), stage III (moderate) (HCC) Renally dose meds. Avoid NSAIDs. PTH/calcium/vitamin D every 6 months.  Will await full records before collecting.    Return in about 3 months (around 04/26/2019) for [place] (30 min).  Greater than 45 minutes was spent with patient, greater than 50% of that time was spent face-to-face    Note is dictated utilizing voice recognition software. Although note has been proof read prior to signing, occasional typographical errors still can be missed. If any questions arise, please do not hesitate to call for verification.  Electronically signed by: Howard Pouch, DO [place]

## 2019-01-24 NOTE — Patient Instructions (Signed)
In was a pleasure meeting you today.  We will call you with lab results. In the meantime please continue the medications you already taking.   Once we get your labs back we will discuss any meds we have to add on.   Follow a diabetic diet. Monitor  blood sugars 2-3 x a day-- once fasting. Write these down.   I will refer you to a dermatologist and a podiatrist. They will call you to schedule.   Restart the gabapentin 1 -2 tabs before bed.   Diabetes Mellitus and Nutrition, Adult When you have diabetes (diabetes mellitus), it is very important to have healthy eating habits because your blood sugar (glucose) levels are greatly affected by what you eat and drink. Eating healthy foods in the appropriate amounts, at about the same times every day, can help you:  Control your blood glucose.  Lower your risk of heart disease.  Improve your blood pressure.  Reach or maintain a healthy weight. Every person with diabetes is different, and each person has different needs for a meal plan. Your health care provider may recommend that you work with a diet and nutrition specialist (dietitian) to make a meal plan that is best for you. Your meal plan may vary depending on factors such as:  The calories you need.  The medicines you take.  Your weight.  Your blood glucose, blood pressure, and cholesterol levels.  Your activity level.  Other health conditions you have, such as heart or kidney disease. How do carbohydrates affect me? Carbohydrates, also called carbs, affect your blood glucose level more than any other type of food. Eating carbs naturally raises the amount of glucose in your blood. Carb counting is a method for keeping track of how many carbs you eat. Counting carbs is important to keep your blood glucose at a healthy level, especially if you use insulin or take certain oral diabetes medicines. It is important to know how many carbs you can safely have in each meal. This is different  for every person. Your dietitian can help you calculate how many carbs you should have at each meal and for each snack. Foods that contain carbs include:  Bread, cereal, rice, pasta, and crackers.  Potatoes and corn.  Peas, beans, and lentils.  Milk and yogurt.  Fruit and juice.  Desserts, such as cakes, cookies, ice cream, and candy. How does alcohol affect me? Alcohol can cause a sudden decrease in blood glucose (hypoglycemia), especially if you use insulin or take certain oral diabetes medicines. Hypoglycemia can be a life-threatening condition. Symptoms of hypoglycemia (sleepiness, dizziness, and confusion) are similar to symptoms of having too much alcohol. If your health care provider says that alcohol is safe for you, follow these guidelines:  Limit alcohol intake to no more than 1 drink per day for nonpregnant women and 2 drinks per day for men. One drink equals 12 oz of beer, 5 oz of wine, or 1 oz of hard liquor.  Do not drink on an empty stomach.  Keep yourself hydrated with water, diet soda, or unsweetened iced tea.  Keep in mind that regular soda, juice, and other mixers may contain a lot of sugar and must be counted as carbs. What are tips for following this plan?  Reading food labels  Start by checking the serving size on the "Nutrition Facts" label of packaged foods and drinks. The amount of calories, carbs, fats, and other nutrients listed on the label is based on one serving of  the item. Many items contain more than one serving per package.  Check the total grams (g) of carbs in one serving. You can calculate the number of servings of carbs in one serving by dividing the total carbs by 15. For example, if a food has 30 g of total carbs, it would be equal to 2 servings of carbs.  Check the number of grams (g) of saturated and trans fats in one serving. Choose foods that have low or no amount of these fats.  Check the number of milligrams (mg) of salt (sodium) in  one serving. Most people should limit total sodium intake to less than 2,300 mg per day.  Always check the nutrition information of foods labeled as "low-fat" or "nonfat". These foods may be higher in added sugar or refined carbs and should be avoided.  Talk to your dietitian to identify your daily goals for nutrients listed on the label. Shopping  Avoid buying canned, premade, or processed foods. These foods tend to be high in fat, sodium, and added sugar.  Shop around the outside edge of the grocery store. This includes fresh fruits and vegetables, bulk grains, fresh meats, and fresh dairy. Cooking  Use low-heat cooking methods, such as baking, instead of high-heat cooking methods like deep frying.  Cook using healthy oils, such as olive, canola, or sunflower oil.  Avoid cooking with butter, cream, or high-fat meats. Meal planning  Eat meals and snacks regularly, preferably at the same times every day. Avoid going long periods of time without eating.  Eat foods high in fiber, such as fresh fruits, vegetables, beans, and whole grains. Talk to your dietitian about how many servings of carbs you can eat at each meal.  Eat 4-6 ounces (oz) of lean protein each day, such as lean meat, chicken, fish, eggs, or tofu. One oz of lean protein is equal to: ? 1 oz of meat, chicken, or fish. ? 1 egg. ?  cup of tofu.  Eat some foods each day that contain healthy fats, such as avocado, nuts, seeds, and fish. Lifestyle  Check your blood glucose regularly.  Exercise regularly as told by your health care provider. This may include: ? 150 minutes of moderate-intensity or vigorous-intensity exercise each week. This could be brisk walking, biking, or water aerobics. ? Stretching and doing strength exercises, such as yoga or weightlifting, at least 2 times a week.  Take medicines as told by your health care provider.  Do not use any products that contain nicotine or tobacco, such as cigarettes and  e-cigarettes. If you need help quitting, ask your health care provider.  Work with a Social worker or diabetes educator to identify strategies to manage stress and any emotional and social challenges. Questions to ask a health care provider  Do I need to meet with a diabetes educator?  Do I need to meet with a dietitian?  What number can I call if I have questions?  When are the best times to check my blood glucose? Where to find more information:  American Diabetes Association: diabetes.org  Academy of Nutrition and Dietetics: www.eatright.[employer] of Diabetes and Digestive and Kidney Diseases (NIH): [employer] Summary  A healthy meal plan will help you control your blood glucose and maintain a healthy lifestyle.  Working with a diet and nutrition specialist (dietitian) can help you make a meal plan that is best for you.  Keep in mind that carbohydrates (carbs) and alcohol have immediate effects on your blood glucose levels.  It is important to count carbs and to use alcohol carefully. This information is not intended to replace advice given to you by your health care provider. Make sure you discuss any questions you have with your health care provider. Document Released: 03/04/2005 Document Revised: 05/20/2017 Document Reviewed: 07/12/2016 Elsevier Patient Education  2020 [employer].

## 2019-01-24 NOTE — Telephone Encounter (Signed)
Please inform patient the following information: His thyroid is functioning normal.  His a1c is 11.  We have to add an oral medication and increase his lantus.     - please confirm he is taking 35 units of lantus every 12 hours and has been for at least 3 months. If he answers yes, then he is to increase his lantus to 40 units every 12 hours (I will call in refills once confirmed). I would like him to follow up in 2 weeks with BG logs of fasting in the morning, afternoon and before bed checks (3x a day). Depending on his readings we will add a oral medication as well that appt.

## 2019-01-25 ENCOUNTER — Encounter: Payer: Self-pay | Admitting: Family Medicine

## 2019-01-25 MED ORDER — INSULIN GLARGINE 100 UNIT/ML ~~LOC~~ SOLN: 40.0000 [IU] | Freq: Two times a day (BID) | SUBCUTANEOUS | 5 refills | Status: DC

## 2019-01-25 NOTE — Telephone Encounter (Signed)
Pt was called and given results and instructions. Pt verified he has been taking 35 units of lantus q12hrs for the past 3 months. Pt was scheduled for F/U visit.

## 2019-01-25 NOTE — Addendum Note (Signed)
Addended by: Howard Pouch A on: 01/25/2019 01:01 PM   Modules accepted: Orders

## 2019-01-26 ENCOUNTER — Encounter: Payer: Self-pay | Admitting: Family Medicine

## 2019-01-26 DIAGNOSIS — I259 Chronic ischemic heart disease, unspecified: Secondary | ICD-10-CM | POA: Insufficient documentation

## 2019-01-26 DIAGNOSIS — I10 Essential (primary) hypertension: Secondary | ICD-10-CM | POA: Insufficient documentation

## 2019-02-12 ENCOUNTER — Ambulatory Visit (INDEPENDENT_AMBULATORY_CARE_PROVIDER_SITE_OTHER): Payer: Medicare Other | Admitting: Family Medicine

## 2019-02-12 ENCOUNTER — Encounter: Payer: Self-pay | Admitting: Family Medicine

## 2019-02-12 ENCOUNTER — Other Ambulatory Visit: Payer: Self-pay

## 2019-02-12 VITALS — BP 113/63 | HR 56 | Temp 97.3°F | Resp 16 | Ht 68.0 in | Wt 176.1 lb

## 2019-02-12 DIAGNOSIS — I252 Old myocardial infarction: Secondary | ICD-10-CM

## 2019-02-12 DIAGNOSIS — I4891 Unspecified atrial fibrillation: Secondary | ICD-10-CM

## 2019-02-12 DIAGNOSIS — E1122 Type 2 diabetes mellitus with diabetic chronic kidney disease: Secondary | ICD-10-CM

## 2019-02-12 DIAGNOSIS — I10 Essential (primary) hypertension: Secondary | ICD-10-CM

## 2019-02-12 DIAGNOSIS — Z794 Long term (current) use of insulin: Secondary | ICD-10-CM

## 2019-02-12 DIAGNOSIS — N183 Chronic kidney disease, stage 3 unspecified: Secondary | ICD-10-CM

## 2019-02-12 DIAGNOSIS — I4892 Unspecified atrial flutter: Secondary | ICD-10-CM

## 2019-02-12 DIAGNOSIS — I739 Peripheral vascular disease, unspecified: Secondary | ICD-10-CM | POA: Diagnosis not present

## 2019-02-12 DIAGNOSIS — I25119 Atherosclerotic heart disease of native coronary artery with unspecified angina pectoris: Secondary | ICD-10-CM

## 2019-02-12 DIAGNOSIS — Z23 Encounter for immunization: Secondary | ICD-10-CM | POA: Diagnosis not present

## 2019-02-12 DIAGNOSIS — Z951 Presence of aortocoronary bypass graft: Secondary | ICD-10-CM

## 2019-02-12 DIAGNOSIS — E785 Hyperlipidemia, unspecified: Secondary | ICD-10-CM

## 2019-02-12 DIAGNOSIS — Z8673 Personal history of transient ischemic attack (TIA), and cerebral infarction without residual deficits: Secondary | ICD-10-CM

## 2019-02-12 DIAGNOSIS — E1149 Type 2 diabetes mellitus with other diabetic neurological complication: Secondary | ICD-10-CM

## 2019-02-12 MED ORDER — GLIPIZIDE 5 MG PO TABS: 5.0000 mg | ORAL_TABLET | Freq: Two times a day (BID) | ORAL | 1 refills | Status: DC

## 2019-02-12 NOTE — Progress Notes (Signed)
Patient ID: Marvin Salinas, male  DOB: 1946/02/06, 73 y.o.   MRN: [id number] Patient Care Team    Relationship Specialty Notifications Start End  Ma Hillock, DO PCP - General Family Medicine  01/24/19   Chesley Mires, MD Consulting Physician Pulmonary Disease  01/26/19   Laurena Slimmer, MD Referring Physician Nephrology  01/26/19   [name], Wonda Cheng, MD Consulting Physician Cardiology  01/26/19   Constance Haw, MD Consulting Physician Cardiology  01/26/19   Calvert Cantor, MD Consulting Physician Ophthalmology  01/26/19   Garvin Fila, MD Consulting Physician Neurology  01/26/19   Lennon Alstrom, MD Consulting Physician Neurology  01/26/19     Chief Complaint  Patient presents with  . Diabetes    F/U for DM check. Pt continues to get high glucose readings. Has BS logs     Subjective:  Marvin Salinas is a 73 y.o.  male present for diabetes follow up Type 2 diabetes mellitus with stage 3 chronic kidney disease, with long-term current use of insulin (HCC) Pt reports compliance with Lantus 40 units twice daily.  Patient denies new onset dizziness, hyperglycemic or hypoglycemic events. Patient denies numbness, tingling in the extremities or nonhealing wounds of feet.  Pt reports his fasting blood glucose ranges since increase in lantus 154- 265, and random glucose 222-409. Has been on gabapentin in the past, felt the dose was too high and he had side effects. PNA series: Pneumonia series completed Flu shot: completed today (recommneded yearly) Foot exam: Referred to podiatry 12/2018 Eye exam: 03/25/2018, Dr. Bing Plume A1c: 8.9>>> 13.7 (08/22/2018)>>11 (01/24/2019)   Atrial fibrillation with RVR (HCC)/Essential hypertension/Ischemic heart disease/ S/P CABG x 3/Paroxysmal atrial flutter (HCC)/PAD (peripheral artery disease) (HCC)/Nonsustained ventricular tachycardia (HCC)/HLD/NSTEMI/h/o stroke/CKD 3 Patient has a history of severe 3-vessel coronary artery disease with chronic total occlusions of  the proximal/mid LAD, mid/distal left circumflex/and proximal RCA.  Moderate caliber D1 and distal RPDA demonstrate 70-80% stenosis.  Moderately elevated left ventricular filling pressure. Pt reports compliance with Sherlynn Stalls, Xarelto, Benicar 20, metoprolol 12.5 mg twice daily, Zetia, tikosyn and Plavix. Blood pressures ranges at home WNL. Patient denies chest pain, shortness of breath or lower extremity edema. Pt is prescribed Plavix and Xarelto. Pt is prescribed statin. At one point he was prescribed tricor, not on current list today. BMP: 01/22/2019 reviewed in care everywhere tab creatinine 1.55, GFR 44.  Baseline creatinine 1.55-1.8 and baseline GFR 36-44. CBC: 08/22/2018 CBC normal 08/22/2018 lipids with elevated triglycerides. Diet: Heart healthy-low-sodium diet encouraged RF: Hypertension, hyperlipidemia, smoker, STEMI, stroke, diabetes, family history, CAD, CVD, PAD, bilateral carotid artery stenosis   ECHO 10/12/2017: Study Conclusions - Left ventricle: The cavity size was normal. Wall thickness was   increased in a pattern of mild LVH. Systolic function was normal.   The estimated ejection fraction was in the range of 55% to 60%.   Wall motion was normal; there were no regional wall motion   abnormalities. Left ventricular diastolic function parameters   were normal. Impressions: - Normal LV systolic function; mild diastolic dysfunction; mild   LVH.  Vas US Carotid Result Date: 03/17/2018 Interpretation:  Right Carotid: Velocities in the right ICA are consistent with a 1-39% stenosis.                Non-hemodynamically significant plaque <50% noted in the CCA. Left Carotid: Velocities in the left ICA are consistent with a 1-39% stenosis.  Non-hemodynamically significant plaque noted in the CCA. The ECA               appears >50% stenosed. Vertebrals:   Bilateral vertebral arteries demonstrate antegrade flow.  Subclavians: Left subclavian artery flow was disturbed. Normal flow  hemodynamics              were seen in the right subclavian artery.  Suggest follow up study in 12 months due to excessive plaque   Carotid duplex 09/2013: 40-59% bilateral carotid stenosis  Depression screen [facility] 2/9 01/24/2019  Decreased Interest 0  Down, Depressed, Hopeless 0  PHQ - 2 Score 0  Altered sleeping 0  Tired, decreased energy 0  Change in appetite 0  Feeling bad or failure about yourself  0  Trouble concentrating 0  Moving slowly or fidgety/restless 0  Suicidal thoughts 0  PHQ-9 Score 0  Difficult doing work/chores Not difficult at all   No flowsheet data found.       Fall Risk  01/24/2019  Falls in the past year? 0  Number falls in past yr: 0  Injury with Fall? 0    Immunization History  Administered Date(s) Administered  . Fluad Quad(high Dose 65+) 02/12/2019  . Influenza Split 05/08/2013, 03/28/2017  . Influenza, High Dose Seasonal PF 05/08/2013, 03/28/2017, 05/02/2018  . Influenza,inj,Quad PF,6+ Mos 04/21/2016  . Pneumococcal Conjugate-13 07/13/2016  . Pneumococcal Polysaccharide-23 02/21/2018  . Zoster 06/22/2015    No exam data present  Past Medical History:  Diagnosis Date  . Acute kidney injury superimposed on chronic kidney disease ([facility]) 10/12/2017  . Atrial fibrillation with RVR ([place]) 10/27/2017  . Back pain   . Basilar artery stenosis    on chronic Plavix  . Benign prostatic hypertrophy without urinary obstruction 04/17/2014  . Cerebral vascular disease    with prior TIA's; followed by Dr. Erling Cruz  . Depression, neurotic 11/21/2013  . Diabetes mellitus    on insulin  . Enthesopathy of ankle and tarsus 09/04/2007   Overview:  Metatarsalgia   . History of renal calculi   . Hyperlipidemia   . Hypertension   . Ischemic heart disease    prior PCI to RCA in 1989. S/P PCI to LAD and OM in 1992. S/P PCI to first DX in 2000. S/P CABG x 3 in May 2011  . OSA (obstructive sleep apnea) 01/11/2018   AHI 18.1 and SaO2 low 73%  . Peripheral neuropathy    . Pneumonia Sept 2012  . Stroke ([place]) 04/16/2001   small right cerebellar infarct on 04/16/2001 at that time he was found to have proximal left vertebral artery, proximal left common carotid artery and both external carotid artery stenosis as well as intracranial stenosis involving mid basilar artery- 07/2011 add questionable TIA   Allergies  Allergen Reactions  . Codeine Nausea And Vomiting  . Lisinopril Cough    Other reaction(s): Cough (ALLERGY/intolerance)  . Nsaids     Other reaction(s): Other (See Comments) CKD Other reaction(s): Other CKD  . Latex Hives   Past Surgical History:  Procedure Laterality Date  . ANGIOPLASTY  1989   right coronary artery  . ANGIOPLASTY  1992   LAD and OM  . ANGIOPLASTY  1998   First DX  . BRAIN SURGERY     on prior records  . CORONARY ARTERY BYPASS GRAFT  11/12/2009   LIMA to LAD, SVG to OM and SVG to RCA  . CORONARY STENT PLACEMENT  2000   Stent to LAD/Circumflex with angioplasty  to first diagonal   . LEFT HEART CATH AND CORS/GRAFTS ANGIOGRAPHY N/A 10/13/2017   Procedure: LEFT HEART CATH AND CORS/GRAFTS ANGIOGRAPHY;  Surgeon: Nelva Bush, MD;  Location: [facility];  Service: Cardiovascular;  Laterality: N/A;   Family History  Problem Relation Age of Onset  . Depression Mother   . Early death Mother   . Kidney disease Mother   . Ovarian cancer Mother   . Hypertension Father   . Heart disease Father   . Heart attack Father   . Asthma Brother   . Diabetes Brother   . Stroke Other        Uncle  . Diabetes Sister    Social History   Social History Narrative   Marital status/children/pets: divorced.   Education/employment: retired, Patient has his GED. 9th grade education.    Safety:      -smoke alarm in the home:Yes     - wears seatbelt: Yes   Patient is right handed.   Patient does not drink any caffeine.    Allergies as of 02/12/2019      Reactions   Codeine Nausea And Vomiting   Lisinopril Cough   Other  reaction(s): Cough (ALLERGY/intolerance)   Nsaids    Other reaction(s): Other (See Comments) CKD Other reaction(s): Other CKD   Latex Hives      Medication List       Accurate as of February 12, 2019 11:21 AM. If you have any questions, ask your nurse or doctor.        clopidogrel 75 MG tablet Commonly known as: PLAVIX TAKE 1 TABLET BY MOUTH EVERY DAY   dofetilide 125 MCG capsule Commonly known as: TIKOSYN Take 1 capsule (125 mcg total) by mouth 2 (two) times daily.   ezetimibe 10 MG tablet Commonly known as: ZETIA TAKE 1 TABLET BY MOUTH EVERY DAY   glipiZIDE 5 MG tablet Commonly known as: GLUCOTROL Take 1 tablet (5 mg total) by mouth 2 (two) times daily before a meal. Started by: Howard Pouch, DO   insulin glargine 100 UNIT/ML injection Commonly known as: LANTUS Inject 0.4 mLs (40 Units total) into the skin 2 (two) times daily.   metoprolol tartrate 25 MG tablet Commonly known as: LOPRESSOR TAKE 0.5 TABLETS (12.5 MG TOTAL) BY MOUTH 2 (TWO) TIMES DAILY.   nitroGLYCERIN 0.4 MG SL tablet Commonly known as: NITROSTAT Place 1 tablet (0.4 mg total) under the tongue every 5 (five) minutes as needed for chest pain.   olmesartan 20 MG tablet Commonly known as: BENICAR Take 1 tablet (20 mg total) by mouth daily.   Rivaroxaban 15 MG Tabs tablet Commonly known as: Xarelto Take 1 tablet (15 mg total) by mouth daily with supper.   rosuvastatin 20 MG tablet Commonly known as: CRESTOR TAKE 1 TABLET BY MOUTH EVERY DAY       All past medical history, surgical history, allergies, family history, immunizations andmedications were updated in the EMR today and reviewed under the history and medication portions of their EMR.    Recent Results (from the past 2160 hour(s))  TSH     Status: None   Collection Time: 01/24/19 11:35 AM  Result Value Ref Range   TSH 0.60 0.35 - 4.50 uIU/mL  Hemoglobin A1c     Status: Abnormal   Collection Time: 01/24/19 11:35 AM  Result Value Ref  Range   Hgb A1c MFr Bld 11.0 (H) 4.6 - 6.5 %    Comment: Glycemic Control Guidelines for People with  Diabetes:Non Diabetic:  <6%Goal of Therapy: <7%Additional Action Suggested:  >8%      ROS: 14 pt review of systems performed and negative (unless mentioned in an HPI)  Objective: BP 113/63 (BP Location: Left Arm, Patient Position: Sitting, Cuff Size: Normal)   Pulse (!) 56   Temp (!) 97.3 F (36.3 C) (Temporal)   Resp 16   Ht 5\' 8"  (1.727 m)   Wt 176 lb 2 oz (79.9 kg)   SpO2 99%   BMI 26.78 kg/m  Gen: Afebrile. No acute distress.  Mildly overweight, pleasant Caucasian male. HENT: AT. [place].  MMM.  Eyes:Pupils Equal Round Reactive to light, Extraocular movements intact,  Conjunctiva without redness, discharge or icterus. Neck/lymp/endocrine: Supple, no lymphadenopathy, no thyromegaly CV: RRR 1/6 systolic murmur, no edema, +2/4 P posterior tibialis pulses Chest: CTAB, no wheeze or crackles Abd: Soft. NTND. BS present.   Neuro:  Normal gait. PERLA. EOMi. Alert. Oriented x3 Psych: Normal affect, dress and demeanor. Normal speech. Normal thought content and judgment.   Assessment/plan: Marvin Salinas is a 73 y.o. male present for est care Type 2 diabetes mellitus with stage 3 chronic kidney disease, with long-term current use of insulin ([place]) Readings today are improved from prior.   - Continue Lantus 40 mg twice daily.  Strongly encouraged him to stay on a routine regimen of 40 mg twice daily, only change if sugars are below 100. -Given his cardiac history and his CKD oral options are limited.   -Added glipizide 5 mg twice daily before meals today. - Encouraged him to try gabapentin 100 mg nightly of his already prescribed medicines at home to see if he can tolerate the lower dose. -Medicines tried: Lantus and Ozempic (side effects) -He declined nutrition referral today.  States he has been doing a Pharmacologist and feels he is doing all right.  He just does not have the  motivation to change his diet as he should but he is working on it. - Ambulatory referral to Podiatry placed July 2020 PNA series: Pneumonia series completed Flu shot: completed today (recommneded yearly) Foot exam: Referred to podiatry Eye exam: 03/25/2018, Dr. Bing Plume Follow-up every 3-4 months  Atrial fibrillation with RVR (HCC)/Essential hypertension/Ischemic heart disease/ S/P CABG x 3/Paroxysmal atrial flutter (HCC)/PAD (peripheral artery disease) (HCC)/Nonsustained ventricular tachycardia (HCC)/HLD/NSTEMI/h/o stroke/ -BP is stable on current regimen. -Continue Crestor 20 mg daily and Zetia 10 mg.  Prescription provided by cardiology. - Continue Xarelto and Plavix.  Prescription provided by cardiology. - Continue Benicar 20 mg daily, metoprolol tart take 12.5 mg twice daily and tikosyn prescribed by cardiology. -Encouraged him to stop smoking. -Continue routine follow-ups with cardiology  Chronic kidney disease (CKD), stage III (moderate) (HCC) Renally dose meds. Avoid NSAIDs. PTH/calcium/vitamin D every 6 months.     Return in about 3 months (around 05/15/2019).    Note is dictated utilizing voice recognition software. Although note has been proof read prior to signing, occasional typographical errors still can be missed. If any questions arise, please do not hesitate to call for verification.  Electronically signed by: Howard Pouch, DO [place]

## 2019-02-12 NOTE — Patient Instructions (Signed)
Take Lantus 40u TWICE a day.  Start glipizide pill TWICE a day with meals.   Follow a diabetic diet and exercise.   Continue to monitor blood glucose every day - once fasting in the morning and once randomly in the day.     Diabetes Mellitus and Nutrition, Adult When you have diabetes (diabetes mellitus), it is very important to have healthy eating habits because your blood sugar (glucose) levels are greatly affected by what you eat and drink. Eating healthy foods in the appropriate amounts, at about the same times every day, can help you:  Control your blood glucose.  Lower your risk of heart disease.  Improve your blood pressure.  Reach or maintain a healthy weight. Every person with diabetes is different, and each person has different needs for a meal plan. Your health care provider may recommend that you work with a diet and nutrition specialist (dietitian) to make a meal plan that is best for you. Your meal plan may vary depending on factors such as:  The calories you need.  The medicines you take.  Your weight.  Your blood glucose, blood pressure, and cholesterol levels.  Your activity level.  Other health conditions you have, such as heart or kidney disease. How do carbohydrates affect me? Carbohydrates, also called carbs, affect your blood glucose level more than any other type of food. Eating carbs naturally raises the amount of glucose in your blood. Carb counting is a method for keeping track of how many carbs you eat. Counting carbs is important to keep your blood glucose at a healthy level, especially if you use insulin or take certain oral diabetes medicines. It is important to know how many carbs you can safely have in each meal. This is different for every person. Your dietitian can help you calculate how many carbs you should have at each meal and for each snack. Foods that contain carbs include:  Bread, cereal, rice, pasta, and crackers.  Potatoes and corn.   Peas, beans, and lentils.  Milk and yogurt.  Fruit and juice.  Desserts, such as cakes, cookies, ice cream, and candy. How does alcohol affect me? Alcohol can cause a sudden decrease in blood glucose (hypoglycemia), especially if you use insulin or take certain oral diabetes medicines. Hypoglycemia can be a life-threatening condition. Symptoms of hypoglycemia (sleepiness, dizziness, and confusion) are similar to symptoms of having too much alcohol. If your health care provider says that alcohol is safe for you, follow these guidelines:  Limit alcohol intake to no more than 1 drink per day for nonpregnant women and 2 drinks per day for men. One drink equals 12 oz of beer, 5 oz of wine, or 1 oz of hard liquor.  Do not drink on an empty stomach.  Keep yourself hydrated with water, diet soda, or unsweetened iced tea.  Keep in mind that regular soda, juice, and other mixers may contain a lot of sugar and must be counted as carbs. What are tips for following this plan?  Reading food labels  Start by checking the serving size on the "Nutrition Facts" label of packaged foods and drinks. The amount of calories, carbs, fats, and other nutrients listed on the label is based on one serving of the item. Many items contain more than one serving per package.  Check the total grams (g) of carbs in one serving. You can calculate the number of servings of carbs in one serving by dividing the total carbs by 15. For example, if  a food has 30 g of total carbs, it would be equal to 2 servings of carbs.  Check the number of grams (g) of saturated and trans fats in one serving. Choose foods that have low or no amount of these fats.  Check the number of milligrams (mg) of salt (sodium) in one serving. Most people should limit total sodium intake to less than 2,300 mg per day.  Always check the nutrition information of foods labeled as "low-fat" or "nonfat". These foods may be higher in added sugar or refined  carbs and should be avoided.  Talk to your dietitian to identify your daily goals for nutrients listed on the label. Shopping  Avoid buying canned, premade, or processed foods. These foods tend to be high in fat, sodium, and added sugar.  Shop around the outside edge of the grocery store. This includes fresh fruits and vegetables, bulk grains, fresh meats, and fresh dairy. Cooking  Use low-heat cooking methods, such as baking, instead of high-heat cooking methods like deep frying.  Cook using healthy oils, such as olive, canola, or sunflower oil.  Avoid cooking with butter, cream, or high-fat meats. Meal planning  Eat meals and snacks regularly, preferably at the same times every day. Avoid going long periods of time without eating.  Eat foods high in fiber, such as fresh fruits, vegetables, beans, and whole grains. Talk to your dietitian about how many servings of carbs you can eat at each meal.  Eat 4-6 ounces (oz) of lean protein each day, such as lean meat, chicken, fish, eggs, or tofu. One oz of lean protein is equal to: ? 1 oz of meat, chicken, or fish. ? 1 egg. ?  cup of tofu.  Eat some foods each day that contain healthy fats, such as avocado, nuts, seeds, and fish. Lifestyle  Check your blood glucose regularly.  Exercise regularly as told by your health care provider. This may include: ? 150 minutes of moderate-intensity or vigorous-intensity exercise each week. This could be brisk walking, biking, or water aerobics. ? Stretching and doing strength exercises, such as yoga or weightlifting, at least 2 times a week.  Take medicines as told by your health care provider.  Do not use any products that contain nicotine or tobacco, such as cigarettes and e-cigarettes. If you need help quitting, ask your health care provider.  Work with a Social worker or diabetes educator to identify strategies to manage stress and any emotional and social challenges. Questions to ask a health  care provider  Do I need to meet with a diabetes educator?  Do I need to meet with a dietitian?  What number can I call if I have questions?  When are the best times to check my blood glucose? Where to find more information:  American Diabetes Association: diabetes.org  Academy of Nutrition and Dietetics: www.eatright.[employer] of Diabetes and Digestive and Kidney Diseases (NIH): [employer] Summary  A healthy meal plan will help you control your blood glucose and maintain a healthy lifestyle.  Working with a diet and nutrition specialist (dietitian) can help you make a meal plan that is best for you.  Keep in mind that carbohydrates (carbs) and alcohol have immediate effects on your blood glucose levels. It is important to count carbs and to use alcohol carefully. This information is not intended to replace advice given to you by your health care provider. Make sure you discuss any questions you have with your health care provider. Document Released: 03/04/2005 Document  Revised: 05/20/2017 Document Reviewed: 07/12/2016 Elsevier Patient Education  [place].

## 2019-02-24 ENCOUNTER — Other Ambulatory Visit: Payer: Self-pay

## 2019-02-24 ENCOUNTER — Emergency Department
Admission: EM | Admit: 2019-02-24 | Discharge: 2019-02-24 | Disposition: A | Discharge status: Home / Self Care | Payer: Medicare Other | Source: Home / Self Care

## 2019-02-24 ENCOUNTER — Encounter: Payer: Self-pay | Admitting: Emergency Medicine

## 2019-02-24 DIAGNOSIS — W57XXXA Bitten or stung by nonvenomous insect and other nonvenomous arthropods, initial encounter: Secondary | ICD-10-CM

## 2019-02-24 DIAGNOSIS — M25532 Pain in left wrist: Secondary | ICD-10-CM | POA: Diagnosis not present

## 2019-02-24 DIAGNOSIS — M25432 Effusion, left wrist: Secondary | ICD-10-CM | POA: Diagnosis not present

## 2019-02-24 MED ORDER — DOXYCYCLINE HYCLATE 100 MG PO CAPS: 100.0000 mg | ORAL_CAPSULE | Freq: Two times a day (BID) | ORAL | 0 refills | Status: DC

## 2019-02-24 MED ORDER — TRIAMCINOLONE ACETONIDE 0.1 % EX CREA: 1.0000 "application " | TOPICAL_CREAM | Freq: Two times a day (BID) | CUTANEOUS | 0 refills | Status: DC

## 2019-02-24 NOTE — ED Provider Notes (Signed)
[facility]    CSN: [id number] Arrival date & time: 02/24/19  0903      History   Chief Complaint Chief Complaint  Patient presents with  . Insect Bite    HPI Marvin Salinas is a 73 y.o. male.   HPI Marvin Salinas is a 73 y.o. male presenting to UC with c/o 1 month of persistent, gradually worsening Left wrist pain, swelling, itching and burning after being bit by a spider while working in his garden about 1 month ago. He has tried OTC cream such as calamine lotion and has taken Tylenol w/o much relief. Yesterday he was working a lot with his hands but this made the pain in his wrist much worse.  Denies fever, chills, n/v/d recently but reports having chills the first day or two after the bite.  He is Right hand dominant.    Past Medical History:  Diagnosis Date  . Acute kidney injury superimposed on chronic kidney disease ([place]) 10/12/2017  . Atrial fibrillation with RVR ([place]) 10/27/2017  . Back pain   . Basilar artery stenosis    on chronic Plavix  . Benign prostatic hypertrophy without urinary obstruction 04/17/2014  . Cerebral vascular disease    with prior TIA's; followed by Dr. Erling Cruz  . Depression, neurotic 11/21/2013  . Diabetes mellitus    on insulin  . Enthesopathy of ankle and tarsus 09/04/2007   Overview:  Metatarsalgia   . History of renal calculi   . Hyperlipidemia   . Hypertension   . Ischemic heart disease    prior PCI to RCA in 1989. S/P PCI to LAD and OM in 1992. S/P PCI to first DX in 2000. S/P CABG x 3 in May 2011  . OSA (obstructive sleep apnea) 01/11/2018   AHI 18.1 and SaO2 low 73%  . Peripheral neuropathy   . Pneumonia Sept 2012  . Stroke ([place]) 04/16/2001   small right cerebellar infarct on 04/16/2001 at that time he was found to have proximal left vertebral artery, proximal left common carotid artery and both external carotid artery stenosis as well as intracranial stenosis involving mid basilar artery- 07/2011 add questionable TIA     Patient Active Problem List   Diagnosis Date Noted  . Hypertension   . Ischemic heart disease   . OSA (obstructive sleep apnea) 01/11/2018  . PAD (peripheral artery disease) ([place]) 11/18/2017  . Atrial fibrillation with RVR ([place]) 10/27/2017  . Nonsustained ventricular tachycardia ([place]) 10/13/2017  . Paroxysmal atrial flutter ([place]) 10/13/2017  . Hx of NSTEMI 10/12/2017  . CAD (coronary artery disease) 09/08/2015  . Chronic kidney disease (CKD), stage III (moderate) ([place]) 04/17/2014  . Diabetes mellitus, type 2 ([place]) 04/17/2014  . Diabetic neuropathy ([place]) 04/17/2014  . History of stroke 04/17/2014  . Tobacco use disorder 12/06/2013  . Hyperlipidemia 03/15/2011  . Cerebral vascular disease 10/06/2010  . S/P CABG x 3 10/06/2010  . Claudication ([place]) 10/06/2010  . Spinal stenosis 10/06/2010    Past Surgical History:  Procedure Laterality Date  . ANGIOPLASTY  1989   right coronary artery  . ANGIOPLASTY  1992   LAD and OM  . ANGIOPLASTY  1998   First DX  . BRAIN SURGERY     on prior records  . CORONARY ARTERY BYPASS GRAFT  11/12/2009   LIMA to LAD, SVG to OM and SVG to RCA  . CORONARY STENT PLACEMENT  2000   Stent to LAD/Circumflex with angioplasty to first diagonal   . LEFT  HEART CATH AND CORS/GRAFTS ANGIOGRAPHY N/A 10/13/2017   Procedure: LEFT HEART CATH AND CORS/GRAFTS ANGIOGRAPHY;  Surgeon: Nelva Bush, MD;  Location: [facility];  Service: Cardiovascular;  Laterality: N/A;       Home Medications    Prior to Admission medications   Medication Sig Start Date End Date Taking? Authorizing Provider  gabapentin (NEURONTIN) 100 MG capsule Take 100 mg by mouth 3 (three) times daily.   Yes [provider]  clopidogrel (PLAVIX) 75 MG tablet TAKE 1 TABLET BY MOUTH EVERY DAY 11/02/18   [name], Wonda Cheng, MD  dofetilide (TIKOSYN) 125 MCG capsule Take 1 capsule (125 mcg total) by mouth 2 (two) times daily. 10/31/17   Rai, Vernelle Emerald, MD  doxycycline (VIBRAMYCIN)  100 MG capsule Take 1 capsule (100 mg total) by mouth 2 (two) times daily. One po bid x 7 days 02/24/19   Noe Gens, PA-C  ezetimibe (ZETIA) 10 MG tablet TAKE 1 TABLET BY MOUTH EVERY DAY 06/30/18   Isaiah Serge, NP  glipiZIDE (GLUCOTROL) 5 MG tablet Take 1 tablet (5 mg total) by mouth 2 (two) times daily before a meal. 02/12/19   Kuneff, Renee A, DO  insulin glargine (LANTUS) 100 UNIT/ML injection Inject 0.4 mLs (40 Units total) into the skin 2 (two) times daily. 01/25/19 02/24/19  Kuneff, Renee A, DO  metoprolol tartrate (LOPRESSOR) 25 MG tablet TAKE 0.5 TABLETS (12.5 MG TOTAL) BY MOUTH 2 (TWO) TIMES DAILY. 12/28/18   [name], Wonda Cheng, MD  nitroGLYCERIN (NITROSTAT) 0.4 MG SL tablet Place 1 tablet (0.4 mg total) under the tongue every 5 (five) minutes as needed for chest pain. 10/14/17   Rama, Venetia Maxon, MD  olmesartan (BENICAR) 20 MG tablet Take 1 tablet (20 mg total) by mouth daily. 01/24/19   Kuneff, Renee A, DO  Rivaroxaban (XARELTO) 15 MG TABS tablet Take 1 tablet (15 mg total) by mouth daily with supper. 12/15/18   [name], Wonda Cheng, MD  rosuvastatin (CRESTOR) 20 MG tablet TAKE 1 TABLET BY MOUTH EVERY DAY 01/04/19   Richardson Dopp T, PA-C  triamcinolone cream (KENALOG) 0.1 % Apply 1 application topically 2 (two) times daily. 02/24/19   Noe Gens, PA-C    Family History Family History  Problem Relation Age of Onset  . Depression Mother   . Early death Mother   . Kidney disease Mother   . Ovarian cancer Mother   . Hypertension Father   . Heart disease Father   . Heart attack Father   . Asthma Brother   . Diabetes Brother   . Stroke Other        Uncle  . Diabetes Sister     Social History Social History   Tobacco Use  . Smoking status: Current Every Day Smoker    Packs/day: 1.00    Years: 54.00    Pack years: 54.00    Types: Cigarettes    Last attempt to quit: 10/19/2009    Years since quitting: 9.3  . Smokeless tobacco: Never Used  Substance Use Topics  . Alcohol use: Yes  .  Drug use: No     Allergies   Codeine, Lisinopril, Nsaids, and Latex   Review of Systems Review of Systems  Constitutional: Negative for chills and fever.  Musculoskeletal: Positive for arthralgias, joint swelling and myalgias.  Skin: Positive for color change and wound.  Neurological: Negative for weakness and numbness.     Physical Exam Triage Vital Signs ED Triage Vitals  Enc Vitals  Group     BP 02/24/19 0919 (!) 145/75     Pulse Rate 02/24/19 0919 62     Resp 02/24/19 0919 17     Temp 02/24/19 0919 97.6 F (36.4 C)     Temp Source 02/24/19 0919 Oral     SpO2 02/24/19 0919 99 %     Weight 02/24/19 0919 176 lb (79.8 kg)     Height --      Head Circumference --      Peak Flow --      Pain Score 02/24/19 0921 6     Pain Loc --      Pain Edu? --      Excl. in [place]? --    No data found.  Updated Vital Signs BP (!) 145/75 (BP Location: Left Arm)   Pulse 62   Temp 97.6 F (36.4 C) (Oral)   Resp 17   Wt 176 lb (79.8 kg)   SpO2 99%   BMI 26.76 kg/m   Visual Acuity Right Eye Distance:   Left Eye Distance:   Bilateral Distance:    Right Eye Near:   Left Eye Near:    Bilateral Near:     Physical Exam Vitals signs and nursing note reviewed.  Constitutional:      Appearance: Normal appearance. He is well-developed.  HENT:     Head: Normocephalic and atraumatic.  Neck:     Musculoskeletal: Normal range of motion.  Cardiovascular:     Rate and Rhythm: Normal rate and regular rhythm.     Pulses:          Radial pulses are 2+ on the left side.  Pulmonary:     Effort: Pulmonary effort is normal.  Musculoskeletal: Normal range of motion.        General: Swelling and tenderness present.     Comments: Left wrist: minimal edema, mild tenderness to dorsal aspect. Full ROM.   Skin:    General: Skin is warm and dry.     Capillary Refill: Capillary refill takes less than 2 seconds.     Findings: Erythema present.     Comments: Left wrist, dorsal aspect: 4 small  scabs with surrounding faint erythema, mild edema, and tenderness, predominantly along radial aspect. No induration, fluctuance, or red streaking.   Neurological:     General: No focal deficit present.     Mental Status: He is alert and oriented to person, place, and time.  Psychiatric:        Behavior: Behavior normal.      UC Treatments / Results  Labs (all labs ordered are listed, but only abnormal results are displayed) Labs Reviewed - No data to display  EKG   Radiology No results found.  Procedures Procedures (including critical care time)  Medications Ordered in UC Medications - No data to display  Initial Impression / Assessment and Plan / UC Course  I have reviewed the triage vital signs and the nursing notes.  Pertinent labs & imaging results that were available during my care of the patient were reviewed by me and considered in my medical decision making (see chart for details).     Will tx for suspected infected insect bite. Encouraged f/u in 4-5 days if not improving.  AVS provided.  Final Clinical Impressions(s) / UC Diagnoses   Final diagnoses:  Bug bite with infection, initial encounter  Pain and swelling of left wrist     Discharge Instructions      Keep  wound clean with warm water and antibacterial soap. Pat dry. You may apply the prescribed triamcinalone cream for itching and swelling.  Please take antibiotics as prescribed and be sure to complete entire course even if you start to feel better to ensure infection does not come back.  Please follow up with family medicine or return to urgent care if not improving in 4-5 days, sooner if worsening.     ED Prescriptions    Medication Sig Dispense Auth. Provider   doxycycline (VIBRAMYCIN) 100 MG capsule Take 1 capsule (100 mg total) by mouth 2 (two) times daily. One po bid x 7 days 14 capsule Gerarda Fraction, Annett Boxwell O, PA-C   triamcinolone cream (KENALOG) 0.1 % Apply 1 application topically 2 (two) times  daily. 30 g Noe Gens, PA-C     Controlled Substance Prescriptions [place] Controlled Substance Registry consulted? Not Applicable   Tyrell Antonio 02/24/19 [id number]

## 2019-02-24 NOTE — ED Triage Notes (Signed)
Patient c/o of a possible spider bite on his left wrist x 1 month ago, having pain and burning.

## 2019-02-24 NOTE — Discharge Instructions (Signed)
°  Keep wound clean with warm water and antibacterial soap. Pat dry. You may apply the prescribed triamcinalone cream for itching and swelling.  Please take antibiotics as prescribed and be sure to complete entire course even if you start to feel better to ensure infection does not come back.  Please follow up with family medicine or return to urgent care if not improving in 4-5 days, sooner if worsening.

## 2019-02-27 ENCOUNTER — Telehealth: Payer: Self-pay

## 2019-02-27 NOTE — Telephone Encounter (Signed)
Form request for diabetic shoes places on Dr. Dierdre Highman desk 02/27/2019. Last OV 02/12/2019.

## 2019-02-28 NOTE — Telephone Encounter (Signed)
Completed and returned

## 2019-02-28 NOTE — Telephone Encounter (Signed)
Faxed to number on paperwork. Confirmation received. Sent to scan

## 2019-03-06 ENCOUNTER — Emergency Department: Payer: Medicare Other

## 2019-03-06 ENCOUNTER — Emergency Department (INDEPENDENT_AMBULATORY_CARE_PROVIDER_SITE_OTHER)
Admission: EM | Admit: 2019-03-06 | Discharge: 2019-03-06 | Disposition: A | Discharge status: Home / Self Care | Payer: Medicare Other | Source: Home / Self Care

## 2019-03-06 ENCOUNTER — Emergency Department (INDEPENDENT_AMBULATORY_CARE_PROVIDER_SITE_OTHER): Payer: Medicare Other

## 2019-03-06 ENCOUNTER — Other Ambulatory Visit: Payer: Self-pay

## 2019-03-06 DIAGNOSIS — M25561 Pain in right knee: Secondary | ICD-10-CM

## 2019-03-06 DIAGNOSIS — M7989 Other specified soft tissue disorders: Secondary | ICD-10-CM | POA: Diagnosis not present

## 2019-03-06 DIAGNOSIS — R600 Localized edema: Secondary | ICD-10-CM

## 2019-03-06 DIAGNOSIS — M79661 Pain in right lower leg: Secondary | ICD-10-CM | POA: Diagnosis not present

## 2019-03-06 NOTE — ED Provider Notes (Signed)
[facility]    CSN: [id number] Arrival date & time: 03/06/19  1159      History   Chief Complaint Chief Complaint  Patient presents with  . Leg Swelling    HPI Marvin Salinas is a 73 y.o. male.   HPI Marvin Salinas is a 73 y.o. male presenting to UC with c/o Right leg swelling that started 2 days ago with pain starting yesterday in his knee and lower leg.  Pain is aching and sore, burning at times on the front of his leg but different than his neuralgia pain. He also has mild swelling in his left leg but denies pain and notes it is much less severe than the right.  Denies chest pain or SOB. No hx of blood clots in his legs. He use to be on lasix but was taken off the medication last year after a heart procedure.  Denies known injury. No hx of gout. He does have compression socks he purchased at a medical supply store but states they feel too tight so he has not been wearing them. He does have an appointment with his PCP tomorrow but decided to not wait due to worsening pain.   Past Medical History:  Diagnosis Date  . Acute kidney injury superimposed on chronic kidney disease ([place]) 10/12/2017  . Atrial fibrillation with RVR ([place]) 10/27/2017  . Back pain   . Basilar artery stenosis    on chronic Plavix  . Benign prostatic hypertrophy without urinary obstruction 04/17/2014  . Cerebral vascular disease    with prior TIA's; followed by Dr. Erling Cruz  . Depression, neurotic 11/21/2013  . Diabetes mellitus    on insulin  . Enthesopathy of ankle and tarsus 09/04/2007   Overview:  Metatarsalgia   . History of renal calculi   . Hyperlipidemia   . Hypertension   . Ischemic heart disease    prior PCI to RCA in 1989. S/P PCI to LAD and OM in 1992. S/P PCI to first DX in 2000. S/P CABG x 3 in May 2011  . OSA (obstructive sleep apnea) 01/11/2018   AHI 18.1 and SaO2 low 73%  . Peripheral neuropathy   . Pneumonia Sept 2012  . Stroke ([place]) 04/16/2001   small right cerebellar infarct  on 04/16/2001 at that time he was found to have proximal left vertebral artery, proximal left common carotid artery and both external carotid artery stenosis as well as intracranial stenosis involving mid basilar artery- 07/2011 add questionable TIA    Patient Active Problem List   Diagnosis Date Noted  . Hypertension   . Ischemic heart disease   . OSA (obstructive sleep apnea) 01/11/2018  . PAD (peripheral artery disease) ([place]) 11/18/2017  . Atrial fibrillation with RVR ([place]) 10/27/2017  . Nonsustained ventricular tachycardia ([place]) 10/13/2017  . Paroxysmal atrial flutter ([place]) 10/13/2017  . Hx of NSTEMI 10/12/2017  . CAD (coronary artery disease) 09/08/2015  . Chronic kidney disease (CKD), stage III (moderate) ([place]) 04/17/2014  . Diabetes mellitus, type 2 ([place]) 04/17/2014  . Diabetic neuropathy ([place]) 04/17/2014  . History of stroke 04/17/2014  . Tobacco use disorder 12/06/2013  . Hyperlipidemia 03/15/2011  . Cerebral vascular disease 10/06/2010  . S/P CABG x 3 10/06/2010  . Claudication ([place]) 10/06/2010  . Spinal stenosis 10/06/2010    Past Surgical History:  Procedure Laterality Date  . ANGIOPLASTY  1989   right coronary artery  . ANGIOPLASTY  1992   LAD and OM  . ANGIOPLASTY  1998  First DX  . BRAIN SURGERY     on prior records  . CORONARY ARTERY BYPASS GRAFT  11/12/2009   LIMA to LAD, SVG to OM and SVG to RCA  . CORONARY STENT PLACEMENT  2000   Stent to LAD/Circumflex with angioplasty to first diagonal   . LEFT HEART CATH AND CORS/GRAFTS ANGIOGRAPHY N/A 10/13/2017   Procedure: LEFT HEART CATH AND CORS/GRAFTS ANGIOGRAPHY;  Surgeon: Nelva Bush, MD;  Location: [facility];  Service: Cardiovascular;  Laterality: N/A;       Home Medications    Prior to Admission medications   Medication Sig Start Date End Date Taking? Authorizing Provider  clopidogrel (PLAVIX) 75 MG tablet TAKE 1 TABLET BY MOUTH EVERY DAY 11/02/18   [name], Wonda Cheng, MD  dofetilide  (TIKOSYN) 125 MCG capsule Take 1 capsule (125 mcg total) by mouth 2 (two) times daily. 10/31/17   Rai, Vernelle Emerald, MD  doxycycline (VIBRAMYCIN) 100 MG capsule Take 1 capsule (100 mg total) by mouth 2 (two) times daily. One po bid x 7 days 02/24/19   Noe Gens, PA-C  ezetimibe (ZETIA) 10 MG tablet TAKE 1 TABLET BY MOUTH EVERY DAY 06/30/18   Isaiah Serge, NP  gabapentin (NEURONTIN) 100 MG capsule Take 100 mg by mouth 3 (three) times daily.    [provider]  glipiZIDE (GLUCOTROL) 5 MG tablet Take 1 tablet (5 mg total) by mouth 2 (two) times daily before a meal. 02/12/19   Kuneff, Renee A, DO  insulin glargine (LANTUS) 100 UNIT/ML injection Inject 0.4 mLs (40 Units total) into the skin 2 (two) times daily. 01/25/19 02/24/19  Kuneff, Renee A, DO  metoprolol tartrate (LOPRESSOR) 25 MG tablet TAKE 0.5 TABLETS (12.5 MG TOTAL) BY MOUTH 2 (TWO) TIMES DAILY. 12/28/18   [name], Wonda Cheng, MD  nitroGLYCERIN (NITROSTAT) 0.4 MG SL tablet Place 1 tablet (0.4 mg total) under the tongue every 5 (five) minutes as needed for chest pain. 10/14/17   Rama, Venetia Maxon, MD  olmesartan (BENICAR) 20 MG tablet Take 1 tablet (20 mg total) by mouth daily. 01/24/19   Kuneff, Renee A, DO  Rivaroxaban (XARELTO) 15 MG TABS tablet Take 1 tablet (15 mg total) by mouth daily with supper. 12/15/18   [name], Wonda Cheng, MD  rosuvastatin (CRESTOR) 20 MG tablet TAKE 1 TABLET BY MOUTH EVERY DAY 01/04/19   Richardson Dopp T, PA-C  triamcinolone cream (KENALOG) 0.1 % Apply 1 application topically 2 (two) times daily. 02/24/19   Noe Gens, PA-C    Family History Family History  Problem Relation Age of Onset  . Depression Mother   . Early death Mother   . Kidney disease Mother   . Ovarian cancer Mother   . Hypertension Father   . Heart disease Father   . Heart attack Father   . Asthma Brother   . Diabetes Brother   . Stroke Other        Uncle  . Diabetes Sister     Social History Social History   Tobacco Use  . Smoking  status: Current Every Day Smoker    Packs/day: 1.00    Years: 54.00    Pack years: 54.00    Types: Cigarettes    Last attempt to quit: 10/19/2009    Years since quitting: 9.3  . Smokeless tobacco: Never Used  Substance Use Topics  . Alcohol use: Yes  . Drug use: No     Allergies   Codeine, Lisinopril, Nsaids, and Latex  Review of Systems Review of Systems  Constitutional: Negative for chills and fever.  Respiratory: Negative for chest tightness and shortness of breath.   Cardiovascular: Positive for leg swelling. Negative for chest pain and palpitations.  Musculoskeletal: Positive for arthralgias, joint swelling and myalgias. Negative for gait problem.  Skin: Negative for color change and wound.     Physical Exam Triage Vital Signs ED Triage Vitals  Enc Vitals Group     BP 03/06/19 1217 130/65     Pulse Rate 03/06/19 1217 66     Resp 03/06/19 1217 20     Temp 03/06/19 1217 97.7 F (36.5 C)     Temp Source 03/06/19 1217 Oral     SpO2 03/06/19 1217 98 %     Weight 03/06/19 1218 177 lb (80.3 kg)     Height 03/06/19 1218 5\' 8"  (1.727 m)     Head Circumference --      Peak Flow --      Pain Score 03/06/19 1218 7     Pain Loc --      Pain Edu? --      Excl. in [place]? --    No data found.  Updated Vital Signs BP 130/65 (BP Location: Right Arm)   Pulse 66   Temp 97.7 F (36.5 C) (Oral)   Resp 20   Ht 5\' 8"  (1.727 m)   Wt 177 lb (80.3 kg)   SpO2 98%   BMI 26.91 kg/m   Visual Acuity Right Eye Distance:   Left Eye Distance:   Bilateral Distance:    Right Eye Near:   Left Eye Near:    Bilateral Near:     Physical Exam Vitals signs and nursing note reviewed.  Constitutional:      Appearance: Normal appearance. He is well-developed.  HENT:     Head: Normocephalic and atraumatic.  Neck:     Musculoskeletal: Normal range of motion.  Cardiovascular:     Rate and Rhythm: Normal rate.  Pulmonary:     Effort: Pulmonary effort is normal.  Musculoskeletal:  Normal range of motion.        General: Swelling and tenderness present.     Comments: Right knee: mild edema. Tenderness to anterior aspect. Full ROM w/o crepitus. Right lower leg 2+ pitting edema. Diffuse tenderness including calf tenderness. Muscle compartment is soft.  Left leg: minimal edema, no tenderness. Full ROM.   Skin:    General: Skin is warm and dry.     Findings: No bruising, erythema or rash.  Neurological:     Mental Status: He is alert and oriented to person, place, and time.  Psychiatric:        Behavior: Behavior normal.      UC Treatments / Results  Labs (all labs ordered are listed, but only abnormal results are displayed) Labs Reviewed - No data to display  EKG   Radiology US Venous Img Lower Unilateral Right  Result Date: 03/06/2019 CLINICAL DATA:  Right lower extremity pain and swelling the past 3 days. History of smoking. Patient is currently on anticoagulation. Evaluate for DVT. EXAM: RIGHT LOWER EXTREMITY VENOUS DOPPLER ULTRASOUND TECHNIQUE: Gray-scale sonography with graded compression, as well as color Doppler and duplex ultrasound were performed to evaluate the lower extremity deep venous systems from the level of the common femoral vein and including the common femoral, femoral, profunda femoral, popliteal and calf veins including the posterior tibial, peroneal and gastrocnemius veins when visible. The superficial great saphenous vein was  also interrogated. Spectral Doppler was utilized to evaluate flow at rest and with distal augmentation maneuvers in the common femoral, femoral and popliteal veins. COMPARISON:  None. FINDINGS: Contralateral Common Femoral Vein: Respiratory phasicity is normal and symmetric with the symptomatic side. No evidence of thrombus. Normal compressibility. Common Femoral Vein: No evidence of thrombus. Normal compressibility, respiratory phasicity and response to augmentation. Saphenofemoral Junction: No evidence of thrombus.  Normal compressibility and flow on color Doppler imaging. Profunda Femoral Vein: No evidence of thrombus. Normal compressibility and flow on color Doppler imaging. Femoral Vein: No evidence of thrombus. Normal compressibility, respiratory phasicity and response to augmentation. Popliteal Vein: No evidence of thrombus. Normal compressibility, respiratory phasicity and response to augmentation. Calf Veins: No evidence of thrombus. Normal compressibility and flow on color Doppler imaging. Superficial Great Saphenous Vein: No evidence of thrombus. Normal compressibility. Venous Reflux:  None. Other Findings:  None. IMPRESSION: No evidence of DVT within the right lower extremity. Electronically Signed   By: Sandi Mariscal M.D.   On: 03/06/2019 13:53   Dg Knee Complete 4 Views Right  Result Date: 03/06/2019 CLINICAL DATA:  Noticed right leg knee and lower leg swelling on Sunday, and then Monday his leg began to cause him pains, no other complaints EXAM: RIGHT KNEE - COMPLETE 4+ VIEW COMPARISON:  None. FINDINGS: No evidence of fracture, dislocation, or joint effusion. No evidence of arthropathy or other focal bone abnormality. There are atherosclerotic calcifications seen in the regional soft tissues. IMPRESSION: No acute osseous abnormality in the right knee. Atherosclerotic calcification in the regional soft tissues. Electronically Signed   By: Audie Pinto M.D.   On: 03/06/2019 13:22    Procedures Procedures (including critical care time)  Medications Ordered in UC Medications - No data to display  Initial Impression / Assessment and Plan / UC Course  I have reviewed the triage vital signs and the nursing notes.  Pertinent labs & imaging results that were available during my care of the patient were reviewed by me and considered in my medical decision making (see chart for details).     Reviewed imaging with pt Will tx conservatively with compression- ace wrap applied today, encouraged elevation  and wearing his compression socks as initial swelling improves F/u with PCP tomorrow as previously scheduled.  Final Clinical Impressions(s) / UC Diagnoses   Final diagnoses:  Acute pain of right knee  Leg edema, right     Discharge Instructions      You may wear the ace wrap for comfort or try compression socks until your visit tomorrow with your family doctor. It is important you keep your appointment for tomorrow as your PCP may want to start you back on lasix (a fluid pill) or order more tests. Please let them know that today's ultrasound did NOT show any blood clots.     ED Prescriptions    None     Controlled Substance Prescriptions [place] Controlled Substance Registry consulted? Not Applicable   Tyrell Antonio 03/06/19 1430

## 2019-03-06 NOTE — Discharge Instructions (Signed)
°  You may wear the ace wrap for comfort or try compression socks until your visit tomorrow with your family doctor. It is important you keep your appointment for tomorrow as your PCP may want to start you back on lasix (a fluid pill) or order more tests. Please let them know that today's ultrasound did NOT show any blood clots.

## 2019-03-06 NOTE — ED Triage Notes (Signed)
Noticed right leg swelling on Sunday, and Monday leg hurt.  Left leg had swelling also.

## 2019-03-07 ENCOUNTER — Encounter: Payer: Self-pay | Admitting: Family Medicine

## 2019-03-07 ENCOUNTER — Telehealth: Payer: Self-pay | Admitting: Family Medicine

## 2019-03-07 ENCOUNTER — Ambulatory Visit (INDEPENDENT_AMBULATORY_CARE_PROVIDER_SITE_OTHER): Payer: Medicare Other | Admitting: Family Medicine

## 2019-03-07 VITALS — BP 120/72 | HR 55 | Temp 97.7°F | Resp 16 | Ht 68.0 in | Wt 178.2 lb

## 2019-03-07 DIAGNOSIS — M25461 Effusion, right knee: Secondary | ICD-10-CM

## 2019-03-07 DIAGNOSIS — M25561 Pain in right knee: Secondary | ICD-10-CM

## 2019-03-07 LAB — CBC WITH DIFFERENTIAL/PLATELET
Basophils Absolute: 0 10*3/uL (ref 0.0–0.1)
Basophils Relative: 0.6 % (ref 0.0–3.0)
Eosinophils Absolute: 0.2 10*3/uL (ref 0.0–0.7)
Eosinophils Relative: 2.4 % (ref 0.0–5.0)
HCT: 37.6 % — ABNORMAL LOW (ref 39.0–52.0)
Hemoglobin: 12.8 g/dL — ABNORMAL LOW (ref 13.0–17.0)
Lymphocytes Relative: 24.3 % (ref 12.0–46.0)
Lymphs Abs: 2.2 10*3/uL (ref 0.7–4.0)
MCHC: 34 g/dL (ref 30.0–36.0)
MCV: 90.7 fl (ref 78.0–100.0)
Monocytes Absolute: 0.6 10*3/uL (ref 0.1–1.0)
Monocytes Relative: 7.3 % (ref 3.0–12.0)
Neutro Abs: 5.8 10*3/uL (ref 1.4–7.7)
Neutrophils Relative %: 65.4 % (ref 43.0–77.0)
Platelets: 301 10*3/uL (ref 150.0–400.0)
RBC: 4.15 Mil/uL — ABNORMAL LOW (ref 4.22–5.81)
RDW: 14 % (ref 11.5–15.5)
WBC: 8.9 10*3/uL (ref 4.0–10.5)

## 2019-03-07 LAB — URIC ACID: Uric Acid, Serum: 5 mg/dL (ref 4.0–7.8)

## 2019-03-07 MED ORDER — GABAPENTIN 100 MG PO CAPS: 200.0000 mg | ORAL_CAPSULE | Freq: Three times a day (TID) | ORAL | 1 refills | Status: DC

## 2019-03-07 MED ORDER — METHYLPREDNISOLONE ACETATE 80 MG/ML IJ SUSP
80.0000 mg | Freq: Once | INTRAMUSCULAR | Status: AC
  Administered 2019-03-07: 80 mg via INTRAMUSCULAR

## 2019-03-07 MED ORDER — PREDNISONE 20 MG PO TABS: ORAL_TABLET | ORAL | 0 refills | Status: DC

## 2019-03-07 NOTE — Patient Instructions (Signed)
Start prednisone Friday morning with food.  You may see you sugars rise while on steroid.   We will call you with lab results.

## 2019-03-07 NOTE — Telephone Encounter (Signed)
Please inform patient the following information: His uric acid level is normal and his white counts are normal. Therefore the cause of his symptoms are not clear and are leaning towards overuse/arthritis flare or injury. Take the steroids as directed- follow up in 2 weeks if not improved, sooner if worsening.

## 2019-03-07 NOTE — Progress Notes (Signed)
Marvin Salinas , 1946/03/08, 73 y.o., male MRN: [id number] Patient Care Team    Relationship Specialty Notifications Start End  Marvin Hillock, Marvin Salinas PCP - General Family Medicine  01/24/19   Marvin Mires, Marvin Salinas Consulting Physician Pulmonary Disease  01/26/19   Marvin Slimmer, Marvin Salinas Referring Physician Nephrology  01/26/19   Marvin Salinas, Marvin Cheng, Marvin Salinas Consulting Physician Cardiology  01/26/19   Marvin Haw, Marvin Salinas Consulting Physician Cardiology  01/26/19   Marvin Cantor, Marvin Salinas Consulting Physician Ophthalmology  01/26/19   Marvin Fila, Marvin Salinas Consulting Physician Neurology  01/26/19   Marvin Alstrom, Marvin Salinas Consulting Physician Neurology  01/26/19     Chief Complaint  Patient presents with  . Leg Pain    R leg pain and swelling since Sunday. Pt went to [facility] urgent care yesterday, xray and [place] comleted. No meds given. Pt did get a spider bite 2-3 weeks ago and did complete abx course.     Subjective: Pt presents for an OV with complaints of right leg pain and swelling of 3 to 4-day duration.  Associated symptoms include swollen red painful knee and above ankle.  He was seen in the urgent care in which a blood clot was ruled out with venous Doppler studies.  He had a knee x-ray which was essentially normal with the exception of venous calcifications posterior knee.  Patient denies any injury that he knows of.  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  He denies any overactivity prior to onset of symptoms.  He does admit to a spider bite on his left forearm about 2 to 3 weeks ago that was treated at an urgent care with doxycycline.  He said he did visualize the spider when it bit him and it was a gray spider.  He states it was not a brown recluse or black widow.  He has an area of redness surrounding where the spider bit him.  He denies any fever, fatigue, headache, myalgias or rash.  He states he thinks he might have a history of gout, that occurred in  his right large toe many years ago.  He is on chronic anticoagulation.  Depression screen [facility] 2/9 01/24/2019  Decreased Interest 0  Down, Depressed, Hopeless 0  PHQ - 2 Score 0  Altered sleeping 0  Tired, decreased energy 0  Change in appetite 0  Feeling bad or failure about yourself  0  Trouble concentrating 0  Moving slowly or fidgety/restless 0  Suicidal thoughts 0  PHQ-9 Score 0  Difficult doing work/chores Not difficult at all    Allergies  Allergen Reactions  . Codeine Nausea And Vomiting  . Lisinopril Cough    Other reaction(s): Cough (ALLERGY/intolerance)  . Nsaids     Other reaction(s): Other (See Comments) CKD Other reaction(s): Other CKD  . Latex Hives   Social History   Social History Narrative   Marital status/children/pets: divorced.   Education/employment: retired, Patient has his GED. 9th grade education.    Safety:      -smoke alarm in the home:Yes     - wears seatbelt: Yes   Patient is right handed.   Patient does not drink any caffeine.   Past Medical History:  Diagnosis Date  . Acute kidney injury superimposed on chronic kidney disease ([place]) 10/12/2017  . Atrial fibrillation with RVR ([place]) 10/27/2017  . Back pain   . Basilar artery stenosis  on chronic Plavix  . Benign prostatic hypertrophy without urinary obstruction 04/17/2014  . Cerebral vascular disease    with prior TIA's; followed by Dr. Erling Cruz  . Depression, neurotic 11/21/2013  . Diabetes mellitus    on insulin  . Enthesopathy of ankle and tarsus 09/04/2007   Overview:  Metatarsalgia   . History of renal calculi   . Hyperlipidemia   . Hypertension   . Ischemic heart disease    prior PCI to RCA in 1989. S/P PCI to LAD and OM in 1992. S/P PCI to first DX in 2000. S/P CABG x 3 in May 2011  . OSA (obstructive sleep apnea) 01/11/2018   AHI 18.1 and SaO2 low 73%  . Peripheral neuropathy   . Pneumonia Sept 2012  . Stroke ([place]) 04/16/2001   small right cerebellar infarct on 04/16/2001 at  that time he was found to have proximal left vertebral artery, proximal left common carotid artery and both external carotid artery stenosis as well as intracranial stenosis involving mid basilar artery- 07/2011 add questionable TIA   Past Surgical History:  Procedure Laterality Date  . ANGIOPLASTY  1989   right coronary artery  . ANGIOPLASTY  1992   LAD and OM  . ANGIOPLASTY  1998   First DX  . BRAIN SURGERY     on prior records  . CORONARY ARTERY BYPASS GRAFT  11/12/2009   LIMA to LAD, SVG to OM and SVG to RCA  . CORONARY STENT PLACEMENT  2000   Stent to LAD/Circumflex with angioplasty to first diagonal   . LEFT HEART CATH AND CORS/GRAFTS ANGIOGRAPHY N/A 10/13/2017   Procedure: LEFT HEART CATH AND CORS/GRAFTS ANGIOGRAPHY;  Surgeon: Nelva Bush, Marvin Salinas;  Location: [facility];  Service: Cardiovascular;  Laterality: N/A;   Family History  Problem Relation Age of Onset  . Depression Mother   . Early death Mother   . Kidney disease Mother   . Ovarian cancer Mother   . Hypertension Father   . Heart disease Father   . Heart attack Father   . Asthma Brother   . Diabetes Brother   . Stroke Other        Uncle  . Diabetes Sister    Allergies as of 03/07/2019      Reactions   Codeine Nausea And Vomiting   Lisinopril Cough   Other reaction(s): Cough (ALLERGY/intolerance)   Nsaids    Other reaction(s): Other (See Comments) CKD Other reaction(s): Other CKD   Latex Hives      Medication List       Accurate as of March 07, 2019 11:59 PM. If you have any questions, ask your nurse or doctor.        STOP taking these medications   doxycycline 100 MG capsule Commonly known as: VIBRAMYCIN Stopped by: Marvin Pouch, Marvin Salinas     TAKE these medications   clopidogrel 75 MG tablet Commonly known as: PLAVIX TAKE 1 TABLET BY MOUTH EVERY DAY   dofetilide 125 MCG capsule Commonly known as: TIKOSYN Take 1 capsule (125 mcg total) by mouth 2 (two) times daily.   ezetimibe 10  MG tablet Commonly known as: ZETIA TAKE 1 TABLET BY MOUTH EVERY DAY   gabapentin 100 MG capsule Commonly known as: NEURONTIN Take 2 capsules (200 mg total) by mouth 3 (three) times daily. What changed: how much to take Changed by: Marvin Pouch, Marvin Salinas   glipiZIDE 5 MG tablet Commonly known as: GLUCOTROL Take 1 tablet (5 mg total) by  mouth 2 (two) times daily before a meal.   insulin glargine 100 UNIT/ML injection Commonly known as: LANTUS Inject 0.4 mLs (40 Units total) into the skin 2 (two) times daily.   metoprolol tartrate 25 MG tablet Commonly known as: LOPRESSOR TAKE 0.5 TABLETS (12.5 MG TOTAL) BY MOUTH 2 (TWO) TIMES DAILY.   nitroGLYCERIN 0.4 MG SL tablet Commonly known as: NITROSTAT Place 1 tablet (0.4 mg total) under the tongue every 5 (five) minutes as needed for chest pain.   olmesartan 20 MG tablet Commonly known as: BENICAR Take 1 tablet (20 mg total) by mouth daily.   predniSONE 20 MG tablet Commonly known as: DELTASONE 40 mg x3d, 20 mg x2d, 10 mg x2d Started by: Marvin Pouch, Marvin Salinas   Rivaroxaban 15 MG Tabs tablet Commonly known as: Xarelto Take 1 tablet (15 mg total) by mouth daily with supper.   rosuvastatin 20 MG tablet Commonly known as: CRESTOR TAKE 1 TABLET BY MOUTH EVERY DAY   triamcinolone cream 0.1 % Commonly known as: KENALOG Apply 1 application topically 2 (two) times daily.       All past medical history, surgical history, allergies, family history, immunizations andmedications were updated in the EMR today and reviewed under the history and medication portions of their EMR.     ROS: Negative, with the exception of above mentioned in HPI   Objective:  BP 120/72 (BP Location: Left Arm, Patient Position: Sitting, Cuff Size: Normal)   Pulse (!) 55   Temp 97.7 F (36.5 C) (Temporal)   Resp 16   Ht 5\' 8"  (1.727 m)   Wt 178 lb 4 oz (80.9 kg)   SpO2 98%   BMI 27.10 kg/m  Body mass index is 27.1 kg/m. Gen: Afebrile. No acute distress.  Nontoxic in appearance, well developed, well nourished.  HENT: AT. [place].  Eyes:Pupils Equal Round Reactive to light, Extraocular movements intact,  Conjunctiva without redness, discharge or icterus. Neck/lymp/endocrine: Supple, no lymphadenopathy CV: RRR, no edema MSK: No erythema, soft tissue swelling anterior knee present.  No effusion.  Mild tenderness to palpation over tibial tuberosity.  No swelling or tenderness of his ankle. Skin: Slight erythemic rash at location of spider bite left forearm.  No purpura or petechiae.  Neuro:  Normal gait. PERLA. EOMi. Alert. Oriented x3   No exam data present No results found. No results found for this or any previous visit (from the past 24 hour(s)).  Assessment/Plan: RANDEN MINIARD is a 73 y.o. male present for OV for  1. Pain and swelling of right knee Suspect possible gout or arthritic flare.  He has tenderness and fluid over tibial tuberosity/bursa area.  We will collect uric acid and a CBC.  IM Depo-Medrol injection provided for him today with instructions to start prednisone in 2 days for short taper. -Close monitoring encourage.  Consider possible joint infection from spider bite? -NSAIDs contraindicated with chronic anticoagulation - Uric acid - CBC w/Diff - methylPREDNISolone acetate (DEPO-MEDROL) injection 80 mg -Follow-up in 10 days if not resolved sooner if worsening  Reviewed expectations re: course of current medical issues.  Discussed self-management of symptoms.  Outlined signs and symptoms indicating need for more acute intervention.  Patient verbalized understanding and all questions were answered.  Patient received an After-Visit Summary.    Orders Placed This Encounter  Procedures  . Uric acid  . CBC w/Diff  > 25 minutes spent with patient, >50% of time spent face to face      Note is dictated  utilizing voice recognition software. Although note has been proof read prior to signing, occasional typographical  errors still can be missed. If any questions arise, please Marvin Salinas not hesitate to call for verification.   electronically signed by:  Marvin Pouch, Marvin Salinas  [place]

## 2019-03-08 NOTE — Telephone Encounter (Signed)
Pt was called and given information, pt verbalized understanding

## 2019-03-12 DIAGNOSIS — M2041 Other hammer toe(s) (acquired), right foot: Secondary | ICD-10-CM | POA: Diagnosis not present

## 2019-03-12 DIAGNOSIS — M2042 Other hammer toe(s) (acquired), left foot: Secondary | ICD-10-CM | POA: Diagnosis not present

## 2019-03-12 DIAGNOSIS — M24573 Contracture, unspecified ankle: Secondary | ICD-10-CM | POA: Diagnosis not present

## 2019-03-12 DIAGNOSIS — E1142 Type 2 diabetes mellitus with diabetic polyneuropathy: Secondary | ICD-10-CM | POA: Diagnosis not present

## 2019-03-14 ENCOUNTER — Encounter (HOSPITAL_COMMUNITY): Payer: Self-pay | Admitting: Emergency Medicine

## 2019-03-14 ENCOUNTER — Other Ambulatory Visit: Payer: Self-pay

## 2019-03-14 ENCOUNTER — Emergency Department (HOSPITAL_COMMUNITY): Payer: Medicare Other

## 2019-03-14 DIAGNOSIS — Z79899 Other long term (current) drug therapy: Secondary | ICD-10-CM | POA: Insufficient documentation

## 2019-03-14 DIAGNOSIS — Z951 Presence of aortocoronary bypass graft: Secondary | ICD-10-CM | POA: Diagnosis not present

## 2019-03-14 DIAGNOSIS — F17210 Nicotine dependence, cigarettes, uncomplicated: Secondary | ICD-10-CM | POA: Insufficient documentation

## 2019-03-14 DIAGNOSIS — Z794 Long term (current) use of insulin: Secondary | ICD-10-CM | POA: Insufficient documentation

## 2019-03-14 DIAGNOSIS — M25561 Pain in right knee: Secondary | ICD-10-CM | POA: Diagnosis not present

## 2019-03-14 DIAGNOSIS — M7041 Prepatellar bursitis, right knee: Secondary | ICD-10-CM | POA: Diagnosis not present

## 2019-03-14 DIAGNOSIS — M7051 Other bursitis of knee, right knee: Secondary | ICD-10-CM | POA: Diagnosis not present

## 2019-03-14 DIAGNOSIS — E119 Type 2 diabetes mellitus without complications: Secondary | ICD-10-CM | POA: Insufficient documentation

## 2019-03-14 DIAGNOSIS — I10 Essential (primary) hypertension: Secondary | ICD-10-CM | POA: Insufficient documentation

## 2019-03-14 DIAGNOSIS — Y939 Activity, unspecified: Secondary | ICD-10-CM | POA: Diagnosis not present

## 2019-03-14 DIAGNOSIS — Z91040 Latex allergy status: Secondary | ICD-10-CM | POA: Diagnosis not present

## 2019-03-14 DIAGNOSIS — I2510 Atherosclerotic heart disease of native coronary artery without angina pectoris: Secondary | ICD-10-CM | POA: Diagnosis not present

## 2019-03-14 NOTE — ED Triage Notes (Signed)
Patient complains of right knee pain that started about two weeks ago.

## 2019-03-15 ENCOUNTER — Emergency Department (HOSPITAL_COMMUNITY)
Admission: EM | Admit: 2019-03-15 | Discharge: 2019-03-15 | Disposition: A | Discharge status: Home / Self Care | Payer: Medicare Other | Attending: Emergency Medicine | Admitting: Emergency Medicine

## 2019-03-15 DIAGNOSIS — M7051 Other bursitis of knee, right knee: Secondary | ICD-10-CM

## 2019-03-15 DIAGNOSIS — M7041 Prepatellar bursitis, right knee: Secondary | ICD-10-CM

## 2019-03-15 LAB — GLUCOSE, CAPILLARY: Glucose-Capillary: 245 mg/dL — ABNORMAL HIGH (ref 70–99)

## 2019-03-15 MED ORDER — LIDOCAINE HCL (PF) 1 % IJ SOLN
INTRAMUSCULAR | Status: AC
  Filled 2019-03-15: qty 10

## 2019-03-15 MED ORDER — LIDOCAINE HCL (PF) 2 % IJ SOLN
INTRAMUSCULAR | Status: AC
  Filled 2019-03-15: qty 20

## 2019-03-15 MED ORDER — TRAMADOL HCL 50 MG PO TABS: 50.0000 mg | ORAL_TABLET | Freq: Four times a day (QID) | ORAL | 0 refills | Status: DC | PRN

## 2019-03-15 MED ORDER — LIDOCAINE HCL (PF) 2 % IJ SOLN
10.0000 mL | Freq: Once | INTRAMUSCULAR | Status: AC
  Administered 2019-03-15: 10 mL

## 2019-03-15 NOTE — ED Provider Notes (Signed)
[facility] EMERGENCY DEPARTMENT Provider Note   CSN: [id number] Arrival date & time: 03/14/19  2238     History   Chief Complaint Chief Complaint  Patient presents with  . Knee Pain    HPI Marvin Salinas is a 73 y.o. male.     Patient presents to the emergency department for evaluation of right knee pain.  He reports that the pain has been present for a couple of weeks.  He denies any injury.  He says that it did swell up initially and then the swelling went down.  He has noticed over the last day or 2 that the swelling is back.  Knee is tender to the touch, under the kneecap.  Pain worsens with trying to walk.     Past Medical History:  Diagnosis Date  . Acute kidney injury superimposed on chronic kidney disease ([place]) 10/12/2017  . Atrial fibrillation with RVR ([place]) 10/27/2017  . Back pain   . Basilar artery stenosis    on chronic Plavix  . Benign prostatic hypertrophy without urinary obstruction 04/17/2014  . Cerebral vascular disease    with prior TIA's; followed by Dr. Erling Cruz  . Depression, neurotic 11/21/2013  . Diabetes mellitus    on insulin  . Enthesopathy of ankle and tarsus 09/04/2007   Overview:  Metatarsalgia   . History of renal calculi   . Hyperlipidemia   . Hypertension   . Ischemic heart disease    prior PCI to RCA in 1989. S/P PCI to LAD and OM in 1992. S/P PCI to first DX in 2000. S/P CABG x 3 in May 2011  . OSA (obstructive sleep apnea) 01/11/2018   AHI 18.1 and SaO2 low 73%  . Peripheral neuropathy   . Pneumonia Sept 2012  . Stroke ([place]) 04/16/2001   small right cerebellar infarct on 04/16/2001 at that time he was found to have proximal left vertebral artery, proximal left common carotid artery and both external carotid artery stenosis as well as intracranial stenosis involving mid basilar artery- 07/2011 add questionable TIA    Patient Active Problem List   Diagnosis Date Noted  . Hypertension   . Ischemic heart disease   . OSA (obstructive  sleep apnea) 01/11/2018  . PAD (peripheral artery disease) ([place]) 11/18/2017  . Atrial fibrillation with RVR ([place]) 10/27/2017  . Nonsustained ventricular tachycardia ([place]) 10/13/2017  . Paroxysmal atrial flutter ([place]) 10/13/2017  . Hx of NSTEMI 10/12/2017  . CAD (coronary artery disease) 09/08/2015  . Chronic kidney disease (CKD), stage III (moderate) ([place]) 04/17/2014  . Diabetes mellitus, type 2 ([place]) 04/17/2014  . Diabetic neuropathy ([place]) 04/17/2014  . History of stroke 04/17/2014  . Tobacco use disorder 12/06/2013  . Hyperlipidemia 03/15/2011  . Cerebral vascular disease 10/06/2010  . S/P CABG x 3 10/06/2010  . Claudication ([place]) 10/06/2010  . Spinal stenosis 10/06/2010    Past Surgical History:  Procedure Laterality Date  . ANGIOPLASTY  1989   right coronary artery  . ANGIOPLASTY  1992   LAD and OM  . ANGIOPLASTY  1998   First DX  . BRAIN SURGERY     on prior records  . CORONARY ARTERY BYPASS GRAFT  11/12/2009   LIMA to LAD, SVG to OM and SVG to RCA  . CORONARY STENT PLACEMENT  2000   Stent to LAD/Circumflex with angioplasty to first diagonal   . LEFT HEART CATH AND CORS/GRAFTS ANGIOGRAPHY N/A 10/13/2017   Procedure: LEFT HEART CATH AND CORS/GRAFTS ANGIOGRAPHY;  Surgeon: End,  Harrell Gave, MD;  Location: [facility];  Service: Cardiovascular;  Laterality: N/A;        Home Medications    Prior to Admission medications   Medication Sig Start Date End Date Taking? Authorizing Provider  clopidogrel (PLAVIX) 75 MG tablet TAKE 1 TABLET BY MOUTH EVERY DAY 11/02/18   [name], Wonda Cheng, MD  dofetilide (TIKOSYN) 125 MCG capsule Take 1 capsule (125 mcg total) by mouth 2 (two) times daily. 10/31/17   Rai, Vernelle Emerald, MD  ezetimibe (ZETIA) 10 MG tablet TAKE 1 TABLET BY MOUTH EVERY DAY 06/30/18   Isaiah Serge, NP  gabapentin (NEURONTIN) 100 MG capsule Take 2 capsules (200 mg total) by mouth 3 (three) times daily. 03/07/19   Kuneff, Renee A, DO  glipiZIDE (GLUCOTROL) 5 MG  tablet Take 1 tablet (5 mg total) by mouth 2 (two) times daily before a meal. 02/12/19   Kuneff, Renee A, DO  insulin glargine (LANTUS) 100 UNIT/ML injection Inject 0.4 mLs (40 Units total) into the skin 2 (two) times daily. 01/25/19 02/24/19  Kuneff, Renee A, DO  metoprolol tartrate (LOPRESSOR) 25 MG tablet TAKE 0.5 TABLETS (12.5 MG TOTAL) BY MOUTH 2 (TWO) TIMES DAILY. 12/28/18   [name], Wonda Cheng, MD  nitroGLYCERIN (NITROSTAT) 0.4 MG SL tablet Place 1 tablet (0.4 mg total) under the tongue every 5 (five) minutes as needed for chest pain. 10/14/17   Rama, Venetia Maxon, MD  olmesartan (BENICAR) 20 MG tablet Take 1 tablet (20 mg total) by mouth daily. 01/24/19   Kuneff, Renee A, DO  predniSONE (DELTASONE) 20 MG tablet 40 mg x3d, 20 mg x2d, 10 mg x2d 03/07/19   Raoul Pitch, Renee A, DO  Rivaroxaban (XARELTO) 15 MG TABS tablet Take 1 tablet (15 mg total) by mouth daily with supper. 12/15/18   [name], Wonda Cheng, MD  rosuvastatin (CRESTOR) 20 MG tablet TAKE 1 TABLET BY MOUTH EVERY DAY 01/04/19   Richardson Dopp T, PA-C  traMADol (ULTRAM) 50 MG tablet Take 1 tablet (50 mg total) by mouth every 6 (six) hours as needed. 03/15/19   Orpah Greek, MD  triamcinolone cream (KENALOG) 0.1 % Apply 1 application topically 2 (two) times daily. 02/24/19   Noe Gens, PA-C    Family History Family History  Problem Relation Age of Onset  . Depression Mother   . Early death Mother   . Kidney disease Mother   . Ovarian cancer Mother   . Hypertension Father   . Heart disease Father   . Heart attack Father   . Asthma Brother   . Diabetes Brother   . Stroke Other        Uncle  . Diabetes Sister     Social History Social History   Tobacco Use  . Smoking status: Current Every Day Smoker    Packs/day: 1.00    Years: 54.00    Pack years: 54.00    Types: Cigarettes    Last attempt to quit: 10/19/2009    Years since quitting: 9.4  . Smokeless tobacco: Never Used  Substance Use Topics  . Alcohol use: Yes  . Drug use: No      Allergies   Codeine, Lisinopril, Nsaids, and Latex   Review of Systems Review of Systems  Constitutional: Negative for fever.  Musculoskeletal: Positive for arthralgias.  All other systems reviewed and are negative.    Physical Exam Updated Vital Signs BP (!) 172/76 (BP Location: Right Arm)   Pulse 62   Temp 97.8 F (  36.6 C) (Oral)   Resp 18   Ht 5\' 8"  (1.727 m)   Wt 79.4 kg   SpO2 100%   BMI 26.61 kg/m   Physical Exam Vitals signs and nursing note reviewed.  Constitutional:      General: He is not in acute distress.    Appearance: Normal appearance. He is well-developed.  HENT:     Head: Normocephalic and atraumatic.     Right Ear: Hearing normal.     Left Ear: Hearing normal.     Nose: Nose normal.  Eyes:     Conjunctiva/sclera: Conjunctivae normal.     Pupils: Pupils are equal, round, and reactive to light.  Neck:     Musculoskeletal: Normal range of motion and neck supple.  Cardiovascular:     Rate and Rhythm: Regular rhythm.     Heart sounds: S1 normal and S2 normal. No murmur. No friction rub. No gallop.   Pulmonary:     Effort: Pulmonary effort is normal. No respiratory distress.     Breath sounds: Normal breath sounds.  Chest:     Chest wall: No tenderness.  Abdominal:     General: Bowel sounds are normal.     Palpations: Abdomen is soft.     Tenderness: There is no abdominal tenderness. There is no guarding or rebound. Negative signs include Murphy's sign and McBurney's sign.     Hernia: No hernia is present.  Musculoskeletal: Normal range of motion.       Legs:     Comments: Tender fluctuant infrapatellar region without erythema, warmth or induration  Skin:    General: Skin is warm and dry.     Findings: No erythema or rash.  Neurological:     Mental Status: He is alert and oriented to person, place, and time.     GCS: GCS eye subscore is 4. GCS verbal subscore is 5. GCS motor subscore is 6.     Cranial Nerves: No cranial nerve deficit.      Sensory: No sensory deficit.     Coordination: Coordination normal.  Psychiatric:        Speech: Speech normal.        Behavior: Behavior normal.        Thought Content: Thought content normal.      ED Treatments / Results  Labs (all labs ordered are listed, but only abnormal results are displayed) Labs Reviewed  GLUCOSE, CAPILLARY - Abnormal; Notable for the following components:      Result Value   Glucose-Capillary 245 (*)    All other components within normal limits  CBG MONITORING, ED    EKG None  Radiology Dg Knee Complete 4 Views Right  Result Date: 03/15/2019 CLINICAL DATA:  Right knee pain and swelling for 2 weeks. EXAM: RIGHT KNEE - COMPLETE 4+ VIEW COMPARISON:  Radiograph 03/06/2019 FINDINGS: No fracture or dislocation. Alignment and joint spaces are maintained. Small knee joint effusion. Prepatellar soft tissue edema overlies the patellar tendon, progressed from prior exam. No soft tissue air. Vascular calcifications and multiple surgical clips. IMPRESSION: 1. Progressive prepatellar soft tissue edema suggesting prepatellar bursitis. 2. No acute osseous abnormality.  Small knee joint effusion. Electronically Signed   By: Keith Rake M.D.   On: 03/15/2019 00:04    Procedures Procedures (including critical care time)  Medications Ordered in ED Medications  lidocaine (XYLOCAINE) 2 % injection (has no administration in time range)  lidocaine (XYLOCAINE) 2 % injection 10 mL (10 mLs Infiltration Given 03/15/19 0225)  Initial Impression / Assessment and Plan / ED Course  I have reviewed the triage vital signs and the nursing notes.  Pertinent labs & imaging results that were available during my care of the patient were reviewed by me and considered in my medical decision making (see chart for details).        Patient presents to the emergency department for evaluation of intermittent pain and swelling of the right knee for 2 weeks.  He denies any  injury.  He does have full range of motion but it hurts when he moves it.  There is very slight fullness of the prepatellar region and a ballotable fullness of the infrapatellar region.  Patient is on a blood thinner but it was felt that there was low risk for a small needle arthrocentesis into the soft tissues to evaluate for bleeding as well as possible infection.  Skin was anesthetized with lidocaine and then an 18-gauge needle was introduced into the area of fluctuance.  No inflammatory fluid was present.  Pressure was held over the area and good hemostasis was achieved, dressed with a Band-Aid.  Patient will therefore treat with rest and compression.  Blood sugar 245.  Does not require any intervention for his blood sugar at this time, however likely too high to introduce any steroids.  There is no warmth, redness or signs of infection at this time.  Follow-up with PCP.  Final Clinical Impressions(s) / ED Diagnoses   Final diagnoses:  Prepatellar bursitis of right knee  Infrapatellar bursitis of right knee    ED Discharge Orders         Ordered    traMADol (ULTRAM) 50 MG tablet  Every 6 hours PRN     03/15/19 0328           Orpah Greek, MD 03/15/19 0330

## 2019-03-17 ENCOUNTER — Other Ambulatory Visit: Payer: Self-pay | Admitting: Cardiology

## 2019-03-19 ENCOUNTER — Telehealth: Payer: Self-pay | Admitting: Family Medicine

## 2019-03-19 DIAGNOSIS — M25561 Pain in right knee: Secondary | ICD-10-CM

## 2019-03-19 NOTE — Telephone Encounter (Signed)
Patient reports his right knee is very swollen this morning. He states that he has seen Dr. Raoul Pitch about before.  Patient is requesting to get a referral to [facility]. He has been there before but not regarding his knee.  Patient can reached at [phone]

## 2019-03-20 DIAGNOSIS — M25561 Pain in right knee: Secondary | ICD-10-CM | POA: Diagnosis not present

## 2019-03-20 NOTE — Telephone Encounter (Signed)
LMOM for pt to let him know that Dr Raoul Pitch will be back in the office Thursday and we will get this message to her about referral.  Dr Raoul Pitch please advise if referral is appropriate.

## 2019-03-22 ENCOUNTER — Other Ambulatory Visit: Payer: Self-pay | Admitting: Cardiovascular Disease

## 2019-03-22 NOTE — Telephone Encounter (Signed)
Referral placed for him

## 2019-03-22 NOTE — Telephone Encounter (Signed)
Called patient and told him Dr. Raoul Pitch sent his referral. Advised patient that if he doesn't hear from them in a couple days, to give them a call to get something scheduled. Patient stated that he was bit by a spider a month ago and was treated at the medical center in [place]. Patient said that he still has a rash, he asked if he should call them in regards to the bite. I advised that he should call them first and if they want him to see Dr. Raoul Pitch, we can arrange a visit to her to look at it. Patient approved.

## 2019-03-23 ENCOUNTER — Emergency Department (INDEPENDENT_AMBULATORY_CARE_PROVIDER_SITE_OTHER)
Admission: EM | Admit: 2019-03-23 | Discharge: 2019-03-23 | Disposition: A | Discharge status: Home / Self Care | Payer: Medicare Other | Source: Home / Self Care

## 2019-03-23 ENCOUNTER — Other Ambulatory Visit: Payer: Self-pay

## 2019-03-23 DIAGNOSIS — R21 Rash and other nonspecific skin eruption: Secondary | ICD-10-CM | POA: Diagnosis not present

## 2019-03-23 MED ORDER — CLOTRIMAZOLE 1 % EX CREA: TOPICAL_CREAM | CUTANEOUS | 0 refills | Status: DC

## 2019-03-23 NOTE — Discharge Instructions (Signed)
°  Because you were already on an antibiotic, doxycycline, for this rash, and it did not resolve, the rash is more likely due to a fungal infection. It is recommended you use the medicated cream as prescribed until you can follow up with dermatology at the end of the month. If the rash is still present, they may need to do a skin biopsy to determine the source of the rash so they can determine the best treatment plan for you. If needed in the meantime, please follow up with your primary care provider for further evaluation and treatment of your rash.

## 2019-03-23 NOTE — ED Triage Notes (Addendum)
Pt here today for re-eval of spider bite rash. Was seen in UC 9/5 for same thing. Given antibiotics and a cream which he has ran out of. Tender to touch, red and itchy.

## 2019-03-23 NOTE — ED Provider Notes (Signed)
[name]    CSN: [id number] Arrival date & time: 03/23/19  0950      History   Chief Complaint Chief Complaint  Patient presents with  . Rash    LT wrist, spider bite    HPI Marvin Salinas is a 73 y.o. male.   HPI Marvin Salinas is a 73 y.o. male presenting to UC with c/o persistent rash on Left forearm/wrist that he believes started after being bitten by a spider about a month ago.  He was seen at [facility] on 02/24/2019, treated with doxycycline and triamcinolone cream with mild relief.  The area is itchy and mildly tender to touch.  He does have f/u with dermatology at the end of this month but was concerned to wait that long without being treated.  Denies fever, chills, n/v/d.   Past Medical History:  Diagnosis Date  . Acute kidney injury superimposed on chronic kidney disease ([place]) 10/12/2017  . Atrial fibrillation with RVR ([place]) 10/27/2017  . Back pain   . Basilar artery stenosis    on chronic Plavix  . Benign prostatic hypertrophy without urinary obstruction 04/17/2014  . Cerebral vascular disease    with prior TIA's; followed by Dr. Erling Cruz  . Depression, neurotic 11/21/2013  . Diabetes mellitus    on insulin  . Enthesopathy of ankle and tarsus 09/04/2007   Overview:  Metatarsalgia   . History of renal calculi   . Hyperlipidemia   . Hypertension   . Ischemic heart disease    prior PCI to RCA in 1989. S/P PCI to LAD and OM in 1992. S/P PCI to first DX in 2000. S/P CABG x 3 in May 2011  . OSA (obstructive sleep apnea) 01/11/2018   AHI 18.1 and SaO2 low 73%  . Peripheral neuropathy   . Pneumonia Sept 2012  . Stroke ([place]) 04/16/2001   small right cerebellar infarct on 04/16/2001 at that time he was found to have proximal left vertebral artery, proximal left common carotid artery and both external carotid artery stenosis as well as intracranial stenosis involving mid basilar artery- 07/2011 add questionable TIA    Patient Active Problem List   Diagnosis Date  Noted  . Hypertension   . Ischemic heart disease   . OSA (obstructive sleep apnea) 01/11/2018  . PAD (peripheral artery disease) ([place]) 11/18/2017  . Atrial fibrillation with RVR ([place]) 10/27/2017  . Nonsustained ventricular tachycardia ([place]) 10/13/2017  . Paroxysmal atrial flutter ([place]) 10/13/2017  . Hx of NSTEMI 10/12/2017  . CAD (coronary artery disease) 09/08/2015  . Chronic kidney disease (CKD), stage III (moderate) 04/17/2014  . Diabetes mellitus, type 2 ([place]) 04/17/2014  . Diabetic neuropathy ([place]) 04/17/2014  . History of stroke 04/17/2014  . Tobacco use disorder 12/06/2013  . Hyperlipidemia 03/15/2011  . Cerebral vascular disease 10/06/2010  . S/P CABG x 3 10/06/2010  . Claudication ([place]) 10/06/2010  . Spinal stenosis 10/06/2010    Past Surgical History:  Procedure Laterality Date  . ANGIOPLASTY  1989   right coronary artery  . ANGIOPLASTY  1992   LAD and OM  . ANGIOPLASTY  1998   First DX  . BRAIN SURGERY     on prior records  . CORONARY ARTERY BYPASS GRAFT  11/12/2009   LIMA to LAD, SVG to OM and SVG to RCA  . CORONARY STENT PLACEMENT  2000   Stent to LAD/Circumflex with angioplasty to first diagonal   . LEFT HEART CATH AND CORS/GRAFTS ANGIOGRAPHY N/A 10/13/2017  Procedure: LEFT HEART CATH AND CORS/GRAFTS ANGIOGRAPHY;  Surgeon: Nelva Bush, MD;  Location: [facility];  Service: Cardiovascular;  Laterality: N/A;       Home Medications    Prior to Admission medications   Medication Sig Start Date End Date Taking? Authorizing Provider  clopidogrel (PLAVIX) 75 MG tablet TAKE 1 TABLET BY MOUTH EVERY DAY 11/02/18   [name], Wonda Cheng, MD  clotrimazole (LOTRIMIN) 1 % cream Apply to affected area 2 times daily for up to 4 weeks 03/23/19   Noe Gens, PA-C  dofetilide (TIKOSYN) 125 MCG capsule Take 1 capsule (125 mcg total) by mouth 2 (two) times daily. 10/31/17   Rai, Vernelle Emerald, MD  ezetimibe (ZETIA) 10 MG tablet TAKE 1 TABLET BY MOUTH EVERY DAY 03/19/19    Isaiah Serge, NP  gabapentin (NEURONTIN) 100 MG capsule Take 2 capsules (200 mg total) by mouth 3 (three) times daily. 03/07/19   Kuneff, Renee A, DO  glipiZIDE (GLUCOTROL) 5 MG tablet Take 1 tablet (5 mg total) by mouth 2 (two) times daily before a meal. 02/12/19   Kuneff, Renee A, DO  insulin glargine (LANTUS) 100 UNIT/ML injection Inject 0.4 mLs (40 Units total) into the skin 2 (two) times daily. 01/25/19 02/24/19  Kuneff, Renee A, DO  metoprolol tartrate (LOPRESSOR) 25 MG tablet TAKE 0.5 TABLETS (12.5 MG TOTAL) BY MOUTH 2 (TWO) TIMES DAILY. 12/28/18   [name], Wonda Cheng, MD  nitroGLYCERIN (NITROSTAT) 0.4 MG SL tablet Place 1 tablet (0.4 mg total) under the tongue every 5 (five) minutes as needed for chest pain. 10/14/17   Rama, Venetia Maxon, MD  olmesartan (BENICAR) 20 MG tablet Take 1 tablet (20 mg total) by mouth daily. 01/24/19   Kuneff, Renee A, DO  predniSONE (DELTASONE) 20 MG tablet 40 mg x3d, 20 mg x2d, 10 mg x2d 03/07/19   Raoul Pitch, Renee A, DO  Rivaroxaban (XARELTO) 15 MG TABS tablet Take 1 tablet (15 mg total) by mouth daily with supper. 12/15/18   [name], Wonda Cheng, MD  rosuvastatin (CRESTOR) 20 MG tablet TAKE 1 TABLET BY MOUTH EVERY DAY 01/04/19   Richardson Dopp T, PA-C  traMADol (ULTRAM) 50 MG tablet Take 1 tablet (50 mg total) by mouth every 6 (six) hours as needed. 03/15/19   Orpah Greek, MD  triamcinolone cream (KENALOG) 0.1 % Apply 1 application topically 2 (two) times daily. 02/24/19   Noe Gens, PA-C    Family History Family History  Problem Relation Age of Onset  . Depression Mother   . Early death Mother   . Kidney disease Mother   . Ovarian cancer Mother   . Hypertension Father   . Heart disease Father   . Heart attack Father   . Asthma Brother   . Diabetes Brother   . Stroke Other        Uncle  . Diabetes Sister     Social History Social History   Tobacco Use  . Smoking status: Current Every Day Smoker    Packs/day: 1.00    Years: 54.00    Pack years:  54.00    Types: Cigarettes    Last attempt to quit: 10/19/2009    Years since quitting: 9.4  . Smokeless tobacco: Never Used  Substance Use Topics  . Alcohol use: Yes  . Drug use: No     Allergies   Codeine, Lisinopril, Nsaids, and Latex   Review of Systems Review of Systems  Constitutional: Negative for chills and fever.  Musculoskeletal: Positive for myalgias (mild soreness of the rash). Negative for arthralgias.  Skin: Positive for color change and rash. Negative for wound.     Physical Exam Triage Vital Signs ED Triage Vitals [03/23/19 0959]  Enc Vitals Group     BP      Pulse      Resp      Temp (!) 97.5 F (36.4 C)     Temp src      SpO2      Weight 174 lb (78.9 kg)     Height 5\' 8"  (1.727 m)     Head Circumference      Peak Flow      Pain Score 2     Pain Loc      Pain Edu?      Excl. in [place]?    No data found.  Updated Vital Signs BP (!) 156/68 (BP Location: Left Arm)   Pulse 60   Temp (!) 97.5 F (36.4 C)   Resp 18   Ht 5\' 8"  (1.727 m)   Wt 174 lb (78.9 kg)   SpO2 98%   BMI 26.46 kg/m   Visual Acuity Right Eye Distance:   Left Eye Distance:   Bilateral Distance:    Right Eye Near:   Left Eye Near:    Bilateral Near:     Physical Exam Vitals signs and nursing note reviewed.  Constitutional:      Appearance: He is well-developed.  HENT:     Head: Normocephalic and atraumatic.  Neck:     Musculoskeletal: Normal range of motion.  Cardiovascular:     Rate and Rhythm: Normal rate.  Pulmonary:     Effort: Pulmonary effort is normal.  Musculoskeletal: Normal range of motion.        General: Tenderness present. No swelling.     Comments: Left distal forearm: no edema, mild tenderness over rash. Full ROM elbow and wrist.   Skin:    General: Skin is warm and dry.     Findings: Erythema and rash present.       Neurological:     Mental Status: He is alert and oriented to person, place, and time.  Psychiatric:        Behavior: Behavior  normal.      UC Treatments / Results  Labs (all labs ordered are listed, but only abnormal results are displayed) Labs Reviewed - No data to display  EKG   Radiology No results found.  Procedures Procedures (including critical care time)  Medications Ordered in UC Medications - No data to display  Initial Impression / Assessment and Plan / UC Course  I have reviewed the triage vital signs and the nursing notes.  Pertinent labs & imaging results that were available during my care of the patient were reviewed by me and considered in my medical decision making (see chart for details).     Pt initially tx with doxycycline and triamcinolone after reported spider bite. Due to minimal improvement and evidence of rash with more well defined border, will tx for tinea AVS provided.  Final Clinical Impressions(s) / UC Diagnoses   Final diagnoses:  Rash and nonspecific skin eruption     Discharge Instructions      Because you were already on an antibiotic, doxycycline, for this rash, and it did not resolve, the rash is more likely due to a fungal infection. It is recommended you use the medicated cream as prescribed until you can follow up  with dermatology at the end of the month. If the rash is still present, they may need to do a skin biopsy to determine the source of the rash so they can determine the best treatment plan for you. If needed in the meantime, please follow up with your primary care provider for further evaluation and treatment of your rash.     ED Prescriptions    Medication Sig Dispense Auth. Provider   clotrimazole (LOTRIMIN) 1 % cream Apply to affected area 2 times daily for up to 4 weeks 15 g Noe Gens, PA-C     PDMP not reviewed this encounter.   Noe Gens, PA-C 03/23/19 1038

## 2019-03-26 LAB — HEMOGLOBIN A1C: Hemoglobin A1C: 9.3

## 2019-04-02 NOTE — Progress Notes (Signed)
Cardiology Office Note:    Date:  04/03/2019   ID:  Marvin Salinas, DOB 1946/03/27, MRN [id number]  PCP:  Marvin Hillock, DO  Cardiologist:  Marvin Moores, MD   Electrophysiologist:  None  PV: Marvin Sacramento, MD Nephrologist: Marvin Slimmer, MD Marvin Salinas)  Referring MD: Marvin Hillock, DO   Chief Complaint  Patient presents with  . Follow-up    CAD, A. fib    History of Present Illness:    Marvin Salinas is a 73 y.o. male with:   Coronary artery disease   S/p CABG in 2011  S/p NSTEMI 09/2017 in setting of AFlutter w/ RVR (cath with patent grafts; demand ischemia)  Mod caliber D1 and distal RPDA stenosis >> PCI could be considered if med Rx failed  Elevated Tn in setting of AFib/Flutter w/ RVR in 10/2017 (demand ischemia)  Persistent AFib/Flutter  CHADS2-VASc=6 (DM, HTN, CAD, TIA x 2, age x 1).>> Rivaroxaban  Rhythm control: Dofetilide (started in 10/2017)  Diabetes mellitus   Hypertension   Hyperlipidemia   Chronic kidney disease   Hx of TIAs   Basilar artery stenosis  PAD  Tobacco use  OSA  Marvin Salinas was last seen via telemedicine in April 2020.  He noted rare episodes of atrial fibrillation with associated angina at that time.  No changes were made.  It was felt that isosorbide could be started if his symptoms should escalate.  He returns for follow-up.  He is here alone.  Since last seen, he has done well.  He has had maybe 2 episodes of atrial fibrillation.  He has not really had any anginal symptoms.  He has not had significant shortness of breath.  He has been dealing with significant bursitis in his right knee.  This has contributed to some swelling in his right leg as well.  He does note symptoms of claudication in his calves.  This seems to be worse on the left.  He feels that his leg pain has gotten worse over time and typically develops symptoms after walking about 100 yards.   Prior CV studies:   The following studies were reviewed today:   Carotid US 03/16/18 Bilat ICA 1-39 L subclavian flow disturbed Repeat in 1 year  ABIs 11/07/2017 Final Interpretation: Right: Resting right ankle-brachial index is within normal range. No evidence of significant right lower extremity arterial disease. The right toe-brachial index is abnormal. Left: Resting left ankle-brachial index indicates mild left lower extremity arterial disease. The left toe-brachial index is abnormal.  Cardiac catheterization 10/13/2017 LM distal 50 LAD proximal 100-CTO, mid to distal 50; D1 ostial 75 LCx mid 100; OM1 100; OM295 RCA proximal 100-CTO; RPDA 70 SVG-RPDA patent SVG-OM 2 patent LIMA-LAD patent   Echo 10/12/2017 Mild LVH, EF 55-60, normal wall motion  Carotid US 03/16/2017 Bilateral ICA 1-39 Follow-up 1 year   Past Medical History:  Diagnosis Date  . Acute kidney injury superimposed on chronic kidney disease ([place]) 10/12/2017  . Atrial fibrillation with RVR ([place]) 10/27/2017  . Back pain   . Basilar artery stenosis    on chronic Plavix  . Benign prostatic hypertrophy without urinary obstruction 04/17/2014  . Cerebral vascular disease    with prior TIA's; followed by Dr. Erling Cruz  . Depression, neurotic 11/21/2013  . Diabetes mellitus    on insulin  . Enthesopathy of ankle and tarsus 09/04/2007   Overview:  Metatarsalgia   . History of renal calculi   . Hyperlipidemia   . Hypertension   .  Ischemic heart disease    prior PCI to RCA in 1989. S/P PCI to LAD and OM in 1992. S/P PCI to first DX in 2000. S/P CABG x 3 in May 2011  . OSA (obstructive sleep apnea) 01/11/2018   AHI 18.1 and SaO2 low 73%  . Peripheral neuropathy   . Pneumonia Sept 2012  . Stroke ([place]) 04/16/2001   small right cerebellar infarct on 04/16/2001 at that time he was found to have proximal left vertebral artery, proximal left common carotid artery and both external carotid artery stenosis as well as intracranial stenosis involving mid basilar artery- 07/2011 add  questionable TIA   Surgical Hx: The patient  has a past surgical history that includes Angioplasty (1989); Angioplasty (1992); Angioplasty (1998); Coronary stent placement (2000); LEFT HEART CATH AND CORS/GRAFTS ANGIOGRAPHY (N/A, 10/13/2017); Coronary artery bypass graft (11/12/2009); and Brain surgery.   Current Medications: Current Meds  Medication Sig  . BD INSULIN SYRINGE U/F 31G X 5/16" 0.5 ML MISC USE WITH INSULIN TWICE A DAY AS NEEDED  . clopidogrel (PLAVIX) 75 MG tablet TAKE 1 TABLET BY MOUTH EVERY DAY  . dofetilide (TIKOSYN) 125 MCG capsule Take 1 capsule (125 mcg total) by mouth 2 (two) times daily.  Marland Kitchen ezetimibe (ZETIA) 10 MG tablet TAKE 1 TABLET BY MOUTH EVERY DAY  . gabapentin (NEURONTIN) 100 MG capsule Take 2 capsules (200 mg total) by mouth 3 (three) times daily.  Marland Kitchen glipiZIDE (GLUCOTROL) 5 MG tablet Take 1 tablet (5 mg total) by mouth 2 (two) times daily before a meal.  . Lancets (ONETOUCH DELICA PLUS 123XX123) MISC ONETOUCH VERIO LANCETS USE TO TEST YOUR BLOOD SUGAR TWICE A DAY FINGER STICK  . metoprolol tartrate (LOPRESSOR) 25 MG tablet TAKE 0.5 TABLETS (12.5 MG TOTAL) BY MOUTH 2 (TWO) TIMES DAILY.  . nitroGLYCERIN (NITROSTAT) 0.4 MG SL tablet Place 1 tablet (0.4 mg total) under the tongue every 5 (five) minutes as needed for chest pain.  Marland Kitchen olmesartan (BENICAR) 20 MG tablet Take 1 tablet (20 mg total) by mouth daily.  Glory Rosebush VERIO test strip USE WITH METER TWICE PER DAY DX DM TYPE 2  . Rivaroxaban (XARELTO) 15 MG TABS tablet Take 1 tablet (15 mg total) by mouth daily with supper.  . rosuvastatin (CRESTOR) 20 MG tablet TAKE 1 TABLET BY MOUTH EVERY DAY  . triamcinolone cream (KENALOG) 0.1 % Apply 1 application topically 2 (two) times daily.     Allergies:   Codeine, Lisinopril, Nsaids, and Latex   Social History   Tobacco Use  . Smoking status: Current Every Day Smoker    Packs/day: 1.00    Years: 54.00    Pack years: 54.00    Types: Cigarettes    Last attempt to  quit: 10/19/2009    Years since quitting: 9.4  . Smokeless tobacco: Never Used  Substance Use Topics  . Alcohol use: Yes  . Drug use: No     Family Hx: The patient's family history includes Asthma in his brother; Depression in his mother; Diabetes in his brother and sister; Early death in his mother; Heart attack in his father; Heart disease in his father; Hypertension in his father; Kidney disease in his mother; Ovarian cancer in his mother; Stroke in an other family member.  ROS:   Please see the history of present illness.    Review of Systems  Gastrointestinal: Negative for hematochezia and melena.  Genitourinary: Negative for hematuria.   All other systems reviewed and are negative.   EKGs/Labs/Other  Test Reviewed:    EKG:  EKG is  ordered today.  The ekg ordered today demonstrates normal sinus rhythm, heart rate 63, left axis deviation, T wave inversions 1, aVL, V5-V6, QTC 437, no change from prior tracing  Recent Labs: 01/24/2019: TSH 0.60 03/07/2019: Hemoglobin 12.8; Platelets 301.0   Recent Lipid Panel Lab Results  Component Value Date/Time   CHOL 127 03/16/2018 09:09 AM   TRIG 165 (H) 03/16/2018 09:09 AM   HDL 45 03/16/2018 09:09 AM   CHOLHDL 2.8 03/16/2018 09:09 AM   CHOLHDL 4.1 10/12/2017 04:11 AM   LDLCALC 49 03/16/2018 09:09 AM    Physical Exam:    VS:  BP 124/68   Pulse 62   Ht 5\' 8"  (1.727 m)   Wt 176 lb 6.4 oz (80 kg)   SpO2 97%   BMI 26.82 kg/m     Wt Readings from Last 3 Encounters:  04/03/19 176 lb 6.4 oz (80 kg)  03/23/19 174 lb (78.9 kg)  03/14/19 175 lb (79.4 kg)     Physical Exam  Constitutional: He is oriented to person, place, and time. He appears well-developed and well-nourished. No distress.  HENT:  Head: Normocephalic and atraumatic.  Eyes: No scleral icterus.  Neck: No JVD present. No thyromegaly present.  Cardiovascular: Normal rate, regular rhythm and normal heart sounds.  No murmur heard. Pulmonary/Chest: Effort normal and  breath sounds normal. He has no rales.  Abdominal: Soft. There is no hepatomegaly.  Musculoskeletal:        General: Edema (trace R ankle edema) present.  Lymphadenopathy:    He has no cervical adenopathy.  Neurological: He is alert and oriented to person, place, and time.  Skin: Skin is warm and dry.  Psychiatric: He has a normal mood and affect.    ASSESSMENT & PLAN:    1. Coronary artery disease involving native coronary artery of native heart with angina pectoris ([place]) History of CABG in 2011.  Cardiac catheterization in April 2019 demonstrated patent bypass grafts.  There was moderate disease in the diagonal and RPDA which is treated medically.  He is currently doing well without symptoms of angina.  Continue current medical regimen which includes clopidogrel, metoprolol, rosuvastatin.    2. Persistent atrial fibrillation (H[facility]) Maintaining normal sinus rhythm on dofetilide.  QTC is acceptable.  Continue current management.  3. Essential (primary) hypertension The patient's blood pressure is controlled on his current regimen.  Continue current therapy.   4. Stage 3a chronic kidney disease Creatinine clearance based upon creatinine obtained in March 2020 was 47.  Continue Rivaroxaban 15 mg once daily.  5. Hyperlipidemia, unspecified hyperlipidemia type LDL optimal on most recent lab work.  Continue current Rx.    6.  Diabetes mellitus A1c back in March was 13.7.  He notes that most recently it was in the 9 range.  Continue follow-up with primary care.  Empagliflozin could be considered given the results of the EMPA-REG OUTCOME trial.  7.  Peripheral arterial disease ABIs on the left were mildly abnormal in May 2019.  He notes worsening claudication symptoms, especially on the left.  He has not seen Dr. Fletcher Anon in more than a year.  I will arrange follow-up ABIs/arterial Dopplers as well as follow-up with Dr. Fletcher Anon.  8. Carotid artery disease He is due for carotid Dopplers.  These  will be arranged.  Dispo:  Return in about 6 months (around 10/02/2019) for Routine Follow Up, w/ Dr. Acie Fredrickson, or Richardson Dopp, PA-C, (virtual or in-person).  Medication Adjustments/Labs and Tests Ordered: Current medicines are reviewed at length with the patient today.  Concerns regarding medicines are outlined above.  Tests Ordered: Orders Placed This Encounter  Procedures  . EKG 12-Lead  . VAS [place] LOWER EXTREMITY ARTERIAL DUPLEX  . VAS US CAROTID   Medication Changes: No orders of the defined types were placed in this encounter.   Signed, Richardson Dopp, PA-C  04/03/2019 8:51 AM    [facility] [place], [place], [place]  [address] Phone: [phone]; Fax: [phone]

## 2019-04-03 ENCOUNTER — Ambulatory Visit: Payer: Medicare Other | Admitting: Physician Assistant

## 2019-04-03 ENCOUNTER — Other Ambulatory Visit: Payer: Self-pay

## 2019-04-03 ENCOUNTER — Encounter: Payer: Self-pay | Admitting: Physician Assistant

## 2019-04-03 VITALS — BP 124/68 | HR 62 | Ht 68.0 in | Wt 176.4 lb

## 2019-04-03 DIAGNOSIS — I739 Peripheral vascular disease, unspecified: Secondary | ICD-10-CM

## 2019-04-03 DIAGNOSIS — I25119 Atherosclerotic heart disease of native coronary artery with unspecified angina pectoris: Secondary | ICD-10-CM | POA: Diagnosis not present

## 2019-04-03 DIAGNOSIS — I10 Essential (primary) hypertension: Secondary | ICD-10-CM

## 2019-04-03 DIAGNOSIS — E785 Hyperlipidemia, unspecified: Secondary | ICD-10-CM

## 2019-04-03 DIAGNOSIS — I4819 Other persistent atrial fibrillation: Secondary | ICD-10-CM

## 2019-04-03 DIAGNOSIS — Z794 Long term (current) use of insulin: Secondary | ICD-10-CM

## 2019-04-03 DIAGNOSIS — N1831 Chronic kidney disease, stage 3a: Secondary | ICD-10-CM

## 2019-04-03 DIAGNOSIS — I779 Disorder of arteries and arterioles, unspecified: Secondary | ICD-10-CM

## 2019-04-03 DIAGNOSIS — E1122 Type 2 diabetes mellitus with diabetic chronic kidney disease: Secondary | ICD-10-CM

## 2019-04-03 DIAGNOSIS — E1121 Type 2 diabetes mellitus with diabetic nephropathy: Secondary | ICD-10-CM

## 2019-04-03 NOTE — Patient Instructions (Signed)
Medication Instructions:  Your physician recommends that you continue on your current medications as directed. Please refer to the Current Medication list given to you today.  If you need a refill on your cardiac medications before your next appointment, please call your pharmacy.   Lab work: None   If you have labs (blood work) drawn today and your tests are completely normal, you will receive your results only by: Marland Kitchen MyChart Message (if you have MyChart) OR . A paper copy in the mail If you have any lab test that is abnormal or we need to change your treatment, we will call you to review the results.  Testing/Procedures: Your physician has requested that you have a carotid duplex. This test is an ultrasound of the carotid arteries in your neck. It looks at blood flow through these arteries that supply the brain with blood. Allow one hour for this exam. There are no restrictions or special instructions.  Your physician has requested that you have a lower extremity arterial exercise duplex with ABI's. During this test, exercise and ultrasound are used to evaluate arterial blood flow in the legs. Allow one hour for this exam. There are no restrictions or special instructions.  Follow-Up: At [facility], you and your health needs are our priority.  As part of our continuing mission to provide you with exceptional heart care, we have created designated Provider Care Teams.  These Care Teams include your primary Cardiologist (physician) and Advanced Practice Providers (APPs -  Physician Assistants and Nurse Practitioners) who all work together to provide you with the care you need, when you need it. You will need a follow up appointment in:  6 months.  Please call our office 2 months in advance to schedule this appointment.  You may see Mertie Moores, MD or one of the following Advanced Practice Providers on your designated Care Team: Richardson Dopp, PA-C  Follow up with Dr. Fletcher Anon after you have  your ABI's done   Any Other Special Instructions Will Be Listed Below (If Applicable).

## 2019-04-04 DIAGNOSIS — M25561 Pain in right knee: Secondary | ICD-10-CM | POA: Diagnosis not present

## 2019-04-12 ENCOUNTER — Other Ambulatory Visit: Payer: Self-pay | Admitting: Physician Assistant

## 2019-04-12 ENCOUNTER — Telehealth: Payer: Self-pay

## 2019-04-12 DIAGNOSIS — I6523 Occlusion and stenosis of bilateral carotid arteries: Secondary | ICD-10-CM

## 2019-04-12 DIAGNOSIS — N182 Chronic kidney disease, stage 2 (mild): Secondary | ICD-10-CM | POA: Diagnosis not present

## 2019-04-12 DIAGNOSIS — I739 Peripheral vascular disease, unspecified: Secondary | ICD-10-CM

## 2019-04-12 DIAGNOSIS — I10 Essential (primary) hypertension: Secondary | ICD-10-CM | POA: Diagnosis not present

## 2019-04-12 NOTE — Telephone Encounter (Signed)
Received fax from [employer]. House call CPE was performed. Paperwork placed on Dr Lucita Lora desk to review. Abstracted A1C into chart.

## 2019-04-18 ENCOUNTER — Encounter: Payer: Self-pay | Admitting: Family Medicine

## 2019-04-18 DIAGNOSIS — H02834 Dermatochalasis of left upper eyelid: Secondary | ICD-10-CM | POA: Diagnosis not present

## 2019-04-18 DIAGNOSIS — H35033 Hypertensive retinopathy, bilateral: Secondary | ICD-10-CM | POA: Diagnosis not present

## 2019-04-18 DIAGNOSIS — H04123 Dry eye syndrome of bilateral lacrimal glands: Secondary | ICD-10-CM | POA: Diagnosis not present

## 2019-04-18 DIAGNOSIS — H02831 Dermatochalasis of right upper eyelid: Secondary | ICD-10-CM | POA: Diagnosis not present

## 2019-04-18 DIAGNOSIS — H5203 Hypermetropia, bilateral: Secondary | ICD-10-CM | POA: Diagnosis not present

## 2019-04-18 LAB — HM DIABETES EYE EXAM

## 2019-04-19 ENCOUNTER — Other Ambulatory Visit: Payer: Self-pay | Admitting: Physician Assistant

## 2019-04-20 ENCOUNTER — Ambulatory Visit (HOSPITAL_COMMUNITY): Payer: Medicare Other

## 2019-04-25 ENCOUNTER — Ambulatory Visit (HOSPITAL_BASED_OUTPATIENT_CLINIC_OR_DEPARTMENT_OTHER)
Admission: RE | Admit: 2019-04-25 | Discharge: 2019-04-25 | Disposition: A | Discharge status: Home / Self Care | Payer: Medicare Other | Source: Ambulatory Visit | Attending: Physician Assistant | Admitting: Physician Assistant

## 2019-04-25 ENCOUNTER — Ambulatory Visit (HOSPITAL_COMMUNITY)
Admission: RE | Admit: 2019-04-25 | Discharge: 2019-04-25 | Disposition: A | Discharge status: Home / Self Care | Payer: Medicare Other | Source: Ambulatory Visit | Attending: Cardiology | Admitting: Cardiology

## 2019-04-25 ENCOUNTER — Other Ambulatory Visit: Payer: Self-pay

## 2019-04-25 DIAGNOSIS — I6523 Occlusion and stenosis of bilateral carotid arteries: Secondary | ICD-10-CM | POA: Insufficient documentation

## 2019-04-25 DIAGNOSIS — I739 Peripheral vascular disease, unspecified: Secondary | ICD-10-CM | POA: Insufficient documentation

## 2019-04-26 ENCOUNTER — Encounter: Payer: Self-pay | Admitting: Physician Assistant

## 2019-04-26 ENCOUNTER — Telehealth: Payer: Self-pay | Admitting: *Deleted

## 2019-04-26 DIAGNOSIS — I6523 Occlusion and stenosis of bilateral carotid arteries: Secondary | ICD-10-CM

## 2019-04-26 NOTE — Telephone Encounter (Signed)
I spoke with pt and reviewed carotid doppler and LEA Doppler/ABI reports with him. Appointment made for pt to see Dr Fletcher Anon on December 1,2020 at 8:00. Order placed for one year carotid doppler follow up.

## 2019-04-26 NOTE — Telephone Encounter (Signed)
See LEA doppler/ABIs also.  I placed call to pt to review test results.  Left message to call office.

## 2019-04-26 NOTE — Telephone Encounter (Signed)
-----   Message from Liliane Shi, [place] sent at 04/26/2019  1:31 PM EST ----- The Carotid ultrasound shows some slight progression in bilateral carotid artery plaque.  It was in the 1-39% range last year and now in the 40-59% range.  PLAN:   -Continue current medications and follow up as planned.   -Repeat Carotid US in 1 year.   -Please send a copy to his PCP. Richardson Dopp, PA-C    04/26/2019 1:26 PM

## 2019-04-26 NOTE — Telephone Encounter (Signed)
Follow up  ° ° °Pt is returning call  ° ° °Please call back  °

## 2019-04-27 DIAGNOSIS — M25561 Pain in right knee: Secondary | ICD-10-CM | POA: Diagnosis not present

## 2019-05-06 DIAGNOSIS — Z01818 Encounter for other preprocedural examination: Secondary | ICD-10-CM | POA: Diagnosis not present

## 2019-05-14 ENCOUNTER — Ambulatory Visit (INDEPENDENT_AMBULATORY_CARE_PROVIDER_SITE_OTHER): Payer: Medicare Other | Admitting: Family Medicine

## 2019-05-14 ENCOUNTER — Other Ambulatory Visit: Payer: Self-pay

## 2019-05-14 ENCOUNTER — Encounter: Payer: Self-pay | Admitting: Family Medicine

## 2019-05-14 VITALS — BP 138/71 | HR 69 | Temp 97.0°F | Ht 68.0 in | Wt 174.0 lb

## 2019-05-14 DIAGNOSIS — I259 Chronic ischemic heart disease, unspecified: Secondary | ICD-10-CM

## 2019-05-14 DIAGNOSIS — I4891 Unspecified atrial fibrillation: Secondary | ICD-10-CM | POA: Diagnosis not present

## 2019-05-14 DIAGNOSIS — I10 Essential (primary) hypertension: Secondary | ICD-10-CM | POA: Diagnosis not present

## 2019-05-14 DIAGNOSIS — I472 Ventricular tachycardia: Secondary | ICD-10-CM

## 2019-05-14 DIAGNOSIS — I252 Old myocardial infarction: Secondary | ICD-10-CM

## 2019-05-14 DIAGNOSIS — I25119 Atherosclerotic heart disease of native coronary artery with unspecified angina pectoris: Secondary | ICD-10-CM

## 2019-05-14 DIAGNOSIS — E1149 Type 2 diabetes mellitus with other diabetic neurological complication: Secondary | ICD-10-CM

## 2019-05-14 DIAGNOSIS — Z8673 Personal history of transient ischemic attack (TIA), and cerebral infarction without residual deficits: Secondary | ICD-10-CM

## 2019-05-14 DIAGNOSIS — D649 Anemia, unspecified: Secondary | ICD-10-CM

## 2019-05-14 DIAGNOSIS — E1122 Type 2 diabetes mellitus with diabetic chronic kidney disease: Secondary | ICD-10-CM

## 2019-05-14 DIAGNOSIS — I779 Disorder of arteries and arterioles, unspecified: Secondary | ICD-10-CM

## 2019-05-14 DIAGNOSIS — I4892 Unspecified atrial flutter: Secondary | ICD-10-CM

## 2019-05-14 DIAGNOSIS — I739 Peripheral vascular disease, unspecified: Secondary | ICD-10-CM

## 2019-05-14 DIAGNOSIS — Z951 Presence of aortocoronary bypass graft: Secondary | ICD-10-CM

## 2019-05-14 DIAGNOSIS — E785 Hyperlipidemia, unspecified: Secondary | ICD-10-CM

## 2019-05-14 DIAGNOSIS — Z794 Long term (current) use of insulin: Secondary | ICD-10-CM

## 2019-05-14 DIAGNOSIS — I4729 Other ventricular tachycardia: Secondary | ICD-10-CM

## 2019-05-14 DIAGNOSIS — N1831 Chronic kidney disease, stage 3a: Secondary | ICD-10-CM

## 2019-05-14 DIAGNOSIS — N183 Chronic kidney disease, stage 3 unspecified: Secondary | ICD-10-CM

## 2019-05-14 MED ORDER — INSULIN GLARGINE 100 UNIT/ML ~~LOC~~ SOLN: 40.0000 [IU] | Freq: Two times a day (BID) | SUBCUTANEOUS | 5 refills | Status: DC

## 2019-05-14 MED ORDER — GABAPENTIN 100 MG PO CAPS: 200.0000 mg | ORAL_CAPSULE | Freq: Every day | ORAL | 1 refills | Status: DC

## 2019-05-14 MED ORDER — GLIPIZIDE 5 MG PO TABS: 5.0000 mg | ORAL_TABLET | Freq: Two times a day (BID) | ORAL | 1 refills | Status: DC

## 2019-05-14 MED ORDER — OLMESARTAN MEDOXOMIL 20 MG PO TABS: 20.0000 mg | ORAL_TABLET | Freq: Every day | ORAL | 1 refills | Status: DC

## 2019-05-14 NOTE — Progress Notes (Signed)
VIRTUAL VISIT VIA VIDEO  I connected with Marvin Salinas on 05/14/19 at  8:30 AM EST by a video enabled telemedicine application and verified that I am speaking with the correct person using two identifiers. Location patient: Home Location provider: [facility], Office Persons participating in the virtual visit: Patient, Dr. Raoul Pitch and R.Baker, LPN  I discussed the limitations of evaluation and management by telemedicine and the availability of in person appointments. The patient expressed understanding and agreed to proceed.    Interactive audio and video telecommunications were attempted between this provider and patient, however failed, due to patient having technical difficulties OR patient did not have access to video capability. We continued and completed visit with audio only.    Patient ID: Marvin Salinas, male  DOB: 1945/10/14, 73 y.o.   MRN: [id number] Patient Care Team    Relationship Specialty Notifications Start End  Ma Hillock, DO PCP - General Family Medicine  01/24/19   [name], Wonda Cheng, MD PCP - Cardiology Cardiology Admissions 04/03/19   Chesley Mires, MD Consulting Physician Pulmonary Disease  01/26/19   Laurena Slimmer, MD Referring Physician Nephrology  01/26/19   Constance Haw, MD Consulting Physician Cardiology  01/26/19   Calvert Cantor, MD Consulting Physician Ophthalmology  01/26/19   Garvin Fila, MD Consulting Physician Neurology  01/26/19   Lennon Alstrom, MD Consulting Physician Neurology  01/26/19     Chief Complaint  Patient presents with  . Diabetes    Pt is doing well. BP has been WNL.   Marland Kitchen Hypertension    Sugar was 120 this morning, fasting     Subjective:  Marvin Salinas is a 73 y.o.  male present for diabetes follow up Type 2 diabetes mellitus with stage 3 chronic kidney disease, with long-term current use of insulin (HCC) Pt reports compliance with Lantus 40 units twice daily and glipizide 5 mg bid. .  Patient denies dizziness,  hyperglycemic or hypoglycemic events. Patient denies numbness, tingling in the extremities or nonhealing wounds of feet.  Pt reports his fasting blood glucose ranges since increase in lantus 120-150. Has been on gabapentin in the past.  PNA series: Pneumonia series completed Flu shot: completed  (recommneded yearly) Foot exam: Referred to podiatry 12/2018 Eye exam: 03/2019, Dr. Bing Plume A1c: 8.9>>> 13.7 (08/22/2018)>>11 (01/24/2019)>>a1c ordered today  Atrial fibrillation with RVR (HCC)/Essential hypertension/Ischemic heart disease/ S/P CABG x 3/Paroxysmal atrial flutter (HCC)/PAD (peripheral artery disease) (HCC)/Nonsustained ventricular tachycardia (HCC)/HLD/NSTEMI/h/o stroke/CKD 3 Patient has a history of severe 3-vessel coronary artery disease with chronic total occlusions of the proximal/mid LAD, mid/distal left circumflex/and proximal RCA.  Moderate caliber D1 and distal RPDA demonstrate 70-80% stenosis.  Moderately elevated left ventricular filling pressure. Pt reports compliance with Sherlynn Stalls, Xarelto, Benicar 20, metoprolol 12.5 mg twice daily, Zetia, tikosyn and Plavix. Blood pressures ranges at home WNL. Patient denies chest pain, shortness of breath, dizziness or lower extremity edema.   Plavix and Xarelto. Pt is prescribed statin. At one point he was prescribed tricor, not on current list today. BMP: 01/22/2019 reviewed in care everywhere tab creatinine 1.55, GFR 44.  Baseline creatinine 1.55-1.8 and baseline GFR 36-44. CBC: 08/22/2018 CBC normal 08/22/2018 lipids with elevated triglycerides. Diet: Heart healthy-low-sodium diet encouraged RF: Hypertension, hyperlipidemia, smoker, STEMI, stroke, diabetes, family history, CAD, CVD, PAD, bilateral carotid artery stenosis   ECHO 10/12/2017: Study Conclusions - Left ventricle: The cavity size was normal. Wall thickness was   increased in a pattern of mild LVH.  Systolic function was normal.   The estimated ejection fraction was in the range of 55% to  60%.   Wall motion was normal; there were no regional wall motion   abnormalities. Left ventricular diastolic function parameters   were normal. Impressions: - Normal LV systolic function; mild diastolic dysfunction; mild   LVH.  Vas US Carotid Result Date: 03/17/2018 Interpretation:  Right Carotid: Velocities in the right ICA are consistent with a 1-39% stenosis.                Non-hemodynamically significant plaque <50% noted in the CCA. Left Carotid: Velocities in the left ICA are consistent with a 1-39% stenosis.               Non-hemodynamically significant plaque noted in the CCA. The ECA               appears >50% stenosed. Vertebrals:   Bilateral vertebral arteries demonstrate antegrade flow.  Subclavians: Left subclavian artery flow was disturbed. Normal flow hemodynamics              were seen in the right subclavian artery.  Suggest follow up study in 12 months due to excessive plaque   Carotid duplex 09/2013: 40-59% bilateral carotid stenosis  Depression screen [facility] 2/9 01/24/2019  Decreased Interest 0  Down, Depressed, Hopeless 0  PHQ - 2 Score 0  Altered sleeping 0  Tired, decreased energy 0  Change in appetite 0  Feeling bad or failure about yourself  0  Trouble concentrating 0  Moving slowly or fidgety/restless 0  Suicidal thoughts 0  PHQ-9 Score 0  Difficult doing work/chores Not difficult at all   No flowsheet data found.       Fall Risk  01/24/2019  Falls in the past year? 0  Number falls in past yr: 0  Injury with Fall? 0    Immunization History  Administered Date(s) Administered  . Fluad Quad(high Dose 65+) 02/12/2019  . Influenza Split 05/08/2013, 03/28/2017  . Influenza, High Dose Seasonal PF 05/08/2013, 03/28/2017, 05/02/2018  . Influenza,inj,Quad PF,6+ Mos 04/21/2016  . Pneumococcal Conjugate-13 07/13/2016  . Pneumococcal Polysaccharide-23 02/21/2018  . Zoster 06/22/2015    No exam data present  Past Medical History:  Diagnosis Date  .  Acute kidney injury superimposed on chronic kidney disease ([place]) 10/12/2017  . Atrial fibrillation with RVR ([place]) 10/27/2017  . Back pain   . Basilar artery stenosis    on chronic Plavix  . Benign prostatic hypertrophy without urinary obstruction 04/17/2014  . Carotid artery disease ([place]) 10/06/2010   Carotid US 04/2019: Bilat ICA 40-59; L subclavian stenosis >> repeat 1 year  . Cerebral vascular disease    with prior TIA's; followed by Dr. Erling Cruz  . Depression, neurotic 11/21/2013  . Diabetes mellitus    on insulin  . Enthesopathy of ankle and tarsus 09/04/2007   Overview:  Metatarsalgia   . History of renal calculi   . Hyperlipidemia   . Hypertension   . Ischemic heart disease    prior PCI to RCA in 1989. S/P PCI to LAD and OM in 1992. S/P PCI to first DX in 2000. S/P CABG x 3 in May 2011  . OSA (obstructive sleep apnea) 01/11/2018   AHI 18.1 and SaO2 low 73%  . PAD (peripheral artery disease) ([place]) 11/18/2017   ABIs/Arterial US 04/2019: R 1.21; L 0.66 // R SFA 30-49, stable > 50 CIA and EIA stenosis; L > 50 CIA stenosis (likely represents severe stenosis  or short segment occlusion)  . Peripheral neuropathy   . Pneumonia Sept 2012  . Stroke ([place]) 04/16/2001   small right cerebellar infarct on 04/16/2001 at that time he was found to have proximal left vertebral artery, proximal left common carotid artery and both external carotid artery stenosis as well as intracranial stenosis involving mid basilar artery- 07/2011 add questionable TIA   Allergies  Allergen Reactions  . Codeine Nausea And Vomiting  . Lisinopril Cough    Other reaction(s): Cough (ALLERGY/intolerance)  . Nsaids     Other reaction(s): Other (See Comments) CKD Other reaction(s): Other CKD  . Latex Hives   Past Surgical History:  Procedure Laterality Date  . ANGIOPLASTY  1989   right coronary artery  . ANGIOPLASTY  1992   LAD and OM  . ANGIOPLASTY  1998   First DX  . BRAIN SURGERY     on prior records  .  CORONARY ARTERY BYPASS GRAFT  11/12/2009   LIMA to LAD, SVG to OM and SVG to RCA  . CORONARY STENT PLACEMENT  2000   Stent to LAD/Circumflex with angioplasty to first diagonal   . LEFT HEART CATH AND CORS/GRAFTS ANGIOGRAPHY N/A 10/13/2017   Procedure: LEFT HEART CATH AND CORS/GRAFTS ANGIOGRAPHY;  Surgeon: Nelva Bush, MD;  Location: [facility];  Service: Cardiovascular;  Laterality: N/A;   Family History  Problem Relation Age of Onset  . Depression Mother   . Early death Mother   . Kidney disease Mother   . Ovarian cancer Mother   . Hypertension Father   . Heart disease Father   . Heart attack Father   . Asthma Brother   . Diabetes Brother   . Stroke Other        Uncle  . Diabetes Sister    Social History   Social History Narrative   Marital status/children/pets: divorced.   Education/employment: retired, Patient has his GED. 9th grade education.    Safety:      -smoke alarm in the home:Yes     - wears seatbelt: Yes   Patient is right handed.   Patient does not drink any caffeine.    Allergies as of 05/14/2019      Reactions   Codeine Nausea And Vomiting   Lisinopril Cough   Other reaction(s): Cough (ALLERGY/intolerance)   Nsaids    Other reaction(s): Other (See Comments) CKD Other reaction(s): Other CKD   Latex Hives      Medication List       Accurate as of May 14, 2019  8:56 AM. If you have any questions, ask your nurse or doctor.        BD Insulin Syringe U/F 31G X 5/16" 0.5 ML Misc Generic drug: Insulin Syringe-Needle U-100 USE WITH INSULIN TWICE A DAY AS NEEDED   clopidogrel 75 MG tablet Commonly known as: PLAVIX TAKE 1 TABLET BY MOUTH EVERY DAY   dofetilide 125 MCG capsule Commonly known as: TIKOSYN TAKE 1 CAPSULE BY MOUTH TWICE A DAY   ezetimibe 10 MG tablet Commonly known as: ZETIA TAKE 1 TABLET BY MOUTH EVERY DAY   gabapentin 100 MG capsule Commonly known as: NEURONTIN Take 2 capsules (200 mg total) by mouth at  bedtime. What changed: when to take this Changed by: Howard Pouch, DO   glipiZIDE 5 MG tablet Commonly known as: GLUCOTROL Take 1 tablet (5 mg total) by mouth 2 (two) times daily before a meal.   insulin glargine 100 UNIT/ML injection Commonly known as: LANTUS  Inject 0.4 mLs (40 Units total) into the skin 2 (two) times daily.   metoprolol tartrate 25 MG tablet Commonly known as: LOPRESSOR TAKE 0.5 TABLETS (12.5 MG TOTAL) BY MOUTH 2 (TWO) TIMES DAILY.   nitroGLYCERIN 0.4 MG SL tablet Commonly known as: NITROSTAT PLACE 1 TABLET (0.4 MG TOTAL) UNDER THE TONGUE EVERY 5 (FIVE) MINUTES AS NEEDED FOR CHEST PAIN.   olmesartan 20 MG tablet Commonly known as: BENICAR Take 1 tablet (20 mg total) by mouth daily.   OneTouch Delica Plus 0000000 Misc ONETOUCH VERIO LANCETS USE TO TEST YOUR BLOOD SUGAR TWICE A DAY FINGER STICK   OneTouch Verio test strip Generic drug: glucose blood USE WITH METER TWICE PER DAY DX DM TYPE 2   Rivaroxaban 15 MG Tabs tablet Commonly known as: Xarelto Take 1 tablet (15 mg total) by mouth daily with supper.   rosuvastatin 20 MG tablet Commonly known as: CRESTOR TAKE 1 TABLET BY MOUTH EVERY DAY   triamcinolone cream 0.1 % Commonly known as: KENALOG Apply 1 application topically 2 (two) times daily.       All past medical history, surgical history, allergies, family history, immunizations andmedications were updated in the EMR today and reviewed under the history and medication portions of their EMR.    Recent Results (from the past 2160 hour(s))  Uric acid     Status: None   Collection Time: 03/07/19  1:33 PM  Result Value Ref Range   Uric Acid, Serum 5.0 4.0 - 7.8 mg/dL  CBC w/Diff     Status: Abnormal   Collection Time: 03/07/19  1:33 PM  Result Value Ref Range   WBC 8.9 4.0 - 10.5 K/uL   RBC 4.15 (L) 4.22 - 5.81 Mil/uL   Hemoglobin 12.8 (L) 13.0 - 17.0 g/dL   HCT 37.6 (L) 39.0 - 52.0 %   MCV 90.7 78.0 - 100.0 fl   MCHC 34.0 30.0 - 36.0 g/dL    RDW 14.0 11.5 - 15.5 %   Platelets 301.0 150.0 - 400.0 K/uL   Neutrophils Relative % 65.4 43.0 - 77.0 %   Lymphocytes Relative 24.3 12.0 - 46.0 %   Monocytes Relative 7.3 3.0 - 12.0 %   Eosinophils Relative 2.4 0.0 - 5.0 %   Basophils Relative 0.6 0.0 - 3.0 %   Neutro Abs 5.8 1.4 - 7.7 K/uL   Lymphs Abs 2.2 0.7 - 4.0 K/uL   Monocytes Absolute 0.6 0.1 - 1.0 K/uL   Eosinophils Absolute 0.2 0.0 - 0.7 K/uL   Basophils Absolute 0.0 0.0 - 0.1 K/uL  Glucose, capillary     Status: Abnormal   Collection Time: 03/15/19  3:12 AM  Result Value Ref Range   Glucose-Capillary 245 (H) 70 - 99 mg/dL  Hemoglobin A1c     Status: None   Collection Time: 03/26/19 12:00 AM  Result Value Ref Range   Hemoglobin A1C 9.3   HM DIABETES EYE EXAM     Status: None   Collection Time: 04/18/19 12:00 AM  Result Value Ref Range   HM Diabetic Eye Exam No Retinopathy No Retinopathy     ROS: 14 pt review of systems performed and negative (unless mentioned in an HPI)  Objective: BP 138/71   Pulse 69   Temp (!) 97 F (36.1 C) (Oral)   Ht 5\' 8"  (1.727 m)   Wt 174 lb (78.9 kg)   BMI 26.46 kg/m  Gen: Afebrile. No acute distress.  thyromegaly Chest: no cough or shortness of breath  Neuro:  Alert. Oriented. Psych: Normal affect, dress and demeanor. Normal speech. Normal thought content and judgment.   Assessment/plan: Marvin Salinas is a 73 y.o. male present for est care Type 2 diabetes mellitus with stage 3 chronic kidney disease, with long-term current use of insulin ([place]) - home blood sugars are better controlled.  - Continue Lantus 40 mg twice daily.   -Given his cardiac history and his CKD oral options are limited.   - continue  glipizide 5 mg twice daily before meals today. - Encouraged him to try gabapentin 200 mg nightly . -Medicines tried: Lantus and Ozempic (side effects) -He declined nutrition referral today.  States he has been doing a Pharmacologist and feels he is doing all right.   He just does not have the motivation to change his diet as he should but he is working on it. - Ambulatory referral to Podiatry placed July 2020 PNA series: Pneumonia series completed Flu shot: completed  (recommneded yearly) Foot exam: Referred to podiatry 12/2018 Eye exam: 03/2019, Dr. Bing Plume A1c: 8.9>>> 13.7 (08/22/2018)>>11 (01/24/2019)>>a1c ordered today Follow-up every 3-4 months  Atrial fibrillation with RVR (HCC)/Essential hypertension/Ischemic heart disease/ S/P CABG x 3/Paroxysmal atrial flutter (HCC)/PAD (peripheral artery disease) (HCC)/Nonsustained ventricular tachycardia (HCC)/HLD/NSTEMI/h/o stroke/PAD - stable.  -Continue Crestor 20 mg daily and Zetia 10 mg.  Prescription provided by cardiology. - Continue Xarelto and Plavix.  Prescription provided by cardiology. - Continue Benicar 20 mg daily, metoprolol tart take 12.5 mg twice daily and tikosyn prescribed by cardiology.- benicar refilled for him today -Encouraged him to stop smoking. -Continue routine follow-ups with cardiology  Chronic kidney disease (CKD), stage III (moderate) (HCC) Renally dose meds. Avoid NSAIDs. PTH/calcium/vitamin D every 6 months.     Return in about 4 months (around 09/11/2019).    Note is dictated utilizing voice recognition software. Although note has been proof read prior to signing, occasional typographical errors still can be missed. If any questions arise, please do not hesitate to call for verification.  Electronically signed by: Howard Pouch, DO [place]

## 2019-05-22 ENCOUNTER — Ambulatory Visit: Payer: Medicare Other | Admitting: Cardiovascular Disease

## 2019-05-22 ENCOUNTER — Other Ambulatory Visit: Payer: Self-pay

## 2019-05-22 VITALS — BP 126/69 | HR 55 | Temp 97.0°F | Ht 68.0 in | Wt 178.0 lb

## 2019-05-22 DIAGNOSIS — Z01812 Encounter for preprocedural laboratory examination: Secondary | ICD-10-CM | POA: Diagnosis not present

## 2019-05-22 DIAGNOSIS — Z720 Tobacco use: Secondary | ICD-10-CM

## 2019-05-22 DIAGNOSIS — I2510 Atherosclerotic heart disease of native coronary artery without angina pectoris: Secondary | ICD-10-CM | POA: Diagnosis not present

## 2019-05-22 DIAGNOSIS — E785 Hyperlipidemia, unspecified: Secondary | ICD-10-CM

## 2019-05-22 DIAGNOSIS — I779 Disorder of arteries and arterioles, unspecified: Secondary | ICD-10-CM

## 2019-05-22 DIAGNOSIS — I739 Peripheral vascular disease, unspecified: Secondary | ICD-10-CM

## 2019-05-22 LAB — BASIC METABOLIC PANEL
BUN/Creatinine Ratio: 12 (ref 10–24)
BUN: 16 mg/dL (ref 8–27)
CO2: 22 mmol/L (ref 20–29)
Calcium: 9.6 mg/dL (ref 8.6–10.2)
Chloride: 105 mmol/L (ref 96–106)
Creatinine, Ser: 1.33 mg/dL — ABNORMAL HIGH (ref 0.76–1.27)
GFR calc Af Amer: 60 mL/min/{1.73_m2} (ref >59)
GFR calc non Af Amer: 52 mL/min/{1.73_m2} — ABNORMAL LOW (ref >59)
Glucose: 177 mg/dL — ABNORMAL HIGH (ref 65–99)
Potassium: 5.1 mmol/L (ref 3.5–5.2)
Sodium: 139 mmol/L (ref 134–144)

## 2019-05-22 LAB — CBC
Hematocrit: 40.2 % (ref 37.5–51.0)
Hemoglobin: 13.4 g/dL (ref 13.0–17.7)
MCH: 30.2 pg (ref 26.6–33.0)
MCHC: 33.3 g/dL (ref 31.5–35.7)
MCV: 91 fL (ref 79–97)
Platelets: 307 10*3/uL (ref 150–450)
RBC: 4.43 x10E6/uL (ref 4.14–5.80)
RDW: 13.3 % (ref 11.6–15.4)
WBC: 9.2 10*3/uL (ref 3.4–10.8)

## 2019-05-22 NOTE — Patient Instructions (Signed)
    [address] [place] [address] Dept: [phone] Loc: [place]  05/22/2019  You are scheduled for a Peripheral Angiogram on Wednesday, December 9 with Dr. Kathlyn [place].  1. Please arrive at the [facility] (Main Entrance A) at [facility]: [address] [place], [place] [address] at 8:30 AM (This time is four hours before your procedure to ensure your preparation and pre-hydration). Free valet parking service is available.   Special note: Every effort is made to have your procedure done on time. Please understand that emergencies sometimes delay scheduled procedures.  2. Diet: Do not eat solid foods after midnight.  The patient may have clear liquids until 5am upon the day of the procedure.  3. Labs:   You will need to have blood drawn TODAY: CBC, BMP  You will need a COVID-19 test on Saturday, December 5th. Your appointment is at 11:00am.  4. Medication instructions in preparation for your procedure:  HOLD [place] TO YOUR PROCEDURE  HOLD GLIPIZIDE ON THE MORNING OF YOUR PROCEDURE  TAKE HALF A DOSE (20 UNITS) OF YOUR LANTUS INSULIN THE NIGHT NIGHT BEFORE YOUR PROCEDURE. DO NOT TAKE ANY INSULIN ON THE MORNING OF YOUR PROCEDURE   On the morning of your procedure, take your Plavix/Clopidogrel and any morning medicines NOT listed above.  You may use sips of water.  5. Plan for one night stay--bring personal belongings. 6. Bring a current list of your medications and current insurance cards. 7. You MUST have a responsible person to drive you home. 8. Someone MUST be with you the first 24 hours after you arrive home or your discharge will be delayed. 9. Please wear clothes that are easy to get on and off and wear slip-on shoes.   Post-Procedure Testing: Your physician has requested that you have a lower or upper extremity  arterial duplex. This test is an ultrasound of the arteries in the legs or arms. It looks at arterial blood flow in the legs and arms. Allow one hour for Lower and Upper Arterial scans. There are no restrictions or special instructions DUE AFTER YOU PROCEDURE  Your physician has requested that you have an ankle brachial index (ABI). During this test an ultrasound and blood pressure cuff are used to evaluate the arteries that supply the arms and legs with blood. Allow thirty minutes for this exam. There are no restrictions or special instructions. DUE AFTER YOU PROCEDURE  Follow-Up: At [facility], you and your health needs are our priority.  As part of our continuing mission to provide you with exceptional heart care, we have created designated Provider Care Teams.  These Care Teams include your primary Cardiologist (physician) and Advanced Practice Providers (APPs -  Physician Assistants and Nurse Practitioners) who all work together to provide you with the care you need, when you need it. You will need a follow up appointment in 1 month with Dr. Fletcher Anon. Please make sure to complete your ultrasounds before this appointment.

## 2019-05-22 NOTE — Progress Notes (Signed)
'  [phone] [id number]-

## 2019-05-22 NOTE — H&P (View-Only) (Signed)
Cardiology Office Note   Date:  05/22/2019   ID:  Marvin Salinas, DOB 1946-05-12, MRN [id number]  PCP:  Ma Hillock, DO  Cardiologist:  Dr. Acie Fredrickson  No chief complaint on file.     History of Present Illness: Marvin Salinas is a 73 y.o. male who is here today for a follow-up visit regarding peripheral arterial disease.  He has known history of coronary artery disease status post CABG in 2001, paroxysmal atrial fibrillation on anticoagulation, essential hypertension, carotid disease, hyperlipidemia, diabetes and prior TIAs. Patient has known history of peripheral arterial disease that has been treated medically.   Vascular studies in 2018  showed mildly reduced ABI on the left side at 0.83. Duplex showed significant bilateral iliac disease worse at the left external iliac artery with a peak velocity of 402. There was moderate SFA/popliteal disease with possible significant stenosis affecting the distal left SFA.He does have underlying chronic kidney disease with creatinine between 1.6-2.  He has recent worsening of left leg claudication.  Noninvasive vascular studies were repeated which showed an ABI of 1.21 on the right and 0.66 on the left.  Duplex showed possible short occlusion of the left common iliac artery.  His symptoms worsened in June and he is able to walk only less than half a block before he has to stop and rest.  He describes severe left lower back discomfort radiating to his whole left leg.  No rest pain or lower extremity ulceration.  In addition, he complains of bilateral knee joint discomfort.   Past Medical History:  Diagnosis Date  . Acute kidney injury superimposed on chronic kidney disease ([place]) 10/12/2017  . Atrial fibrillation with RVR ([place]) 10/27/2017  . Back pain   . Basilar artery stenosis    on chronic Plavix  . Benign prostatic hypertrophy without urinary obstruction 04/17/2014  . Carotid artery disease ([place]) 10/06/2010   Carotid US 04/2019: Bilat ICA  40-59; L subclavian stenosis >> repeat 1 year  . Cerebral vascular disease    with prior TIA's; followed by Dr. Erling Cruz  . Depression, neurotic 11/21/2013  . Diabetes mellitus    on insulin  . Enthesopathy of ankle and tarsus 09/04/2007   Overview:  Metatarsalgia   . History of renal calculi   . Hyperlipidemia   . Hypertension   . Ischemic heart disease    prior PCI to RCA in 1989. S/P PCI to LAD and OM in 1992. S/P PCI to first DX in 2000. S/P CABG x 3 in May 2011  . OSA (obstructive sleep apnea) 01/11/2018   AHI 18.1 and SaO2 low 73%  . PAD (peripheral artery disease) ([place]) 11/18/2017   ABIs/Arterial US 04/2019: R 1.21; L 0.66 // R SFA 30-49, stable > 50 CIA and EIA stenosis; L > 50 CIA stenosis (likely represents severe stenosis or short segment occlusion)  . Peripheral neuropathy   . Pneumonia Sept 2012  . Stroke ([place]) 04/16/2001   small right cerebellar infarct on 04/16/2001 at that time he was found to have proximal left vertebral artery, proximal left common carotid artery and both external carotid artery stenosis as well as intracranial stenosis involving mid basilar artery- 07/2011 add questionable TIA    Past Surgical History:  Procedure Laterality Date  . ANGIOPLASTY  1989   right coronary artery  . ANGIOPLASTY  1992   LAD and OM  . ANGIOPLASTY  1998   First DX  . BRAIN SURGERY     on prior  records  . CORONARY ARTERY BYPASS GRAFT  11/12/2009   LIMA to LAD, SVG to OM and SVG to RCA  . CORONARY STENT PLACEMENT  2000   Stent to LAD/Circumflex with angioplasty to first diagonal   . LEFT HEART CATH AND CORS/GRAFTS ANGIOGRAPHY N/A 10/13/2017   Procedure: LEFT HEART CATH AND CORS/GRAFTS ANGIOGRAPHY;  Surgeon: Nelva Bush, MD;  Location: [facility];  Service: Cardiovascular;  Laterality: N/A;     Current Outpatient Medications  Medication Sig Dispense Refill  . BD INSULIN SYRINGE U/F 31G X 5/16" 0.5 ML MISC USE WITH INSULIN TWICE A DAY AS NEEDED    . clopidogrel  (PLAVIX) 75 MG tablet TAKE 1 TABLET BY MOUTH EVERY DAY 90 tablet 3  . dofetilide (TIKOSYN) 125 MCG capsule TAKE 1 CAPSULE BY MOUTH TWICE A DAY 180 capsule 1  . ezetimibe (ZETIA) 10 MG tablet TAKE 1 TABLET BY MOUTH EVERY DAY 90 tablet 0  . gabapentin (NEURONTIN) 100 MG capsule Take 2 capsules (200 mg total) by mouth at bedtime. 180 capsule 1  . glipiZIDE (GLUCOTROL) 5 MG tablet Take 1 tablet (5 mg total) by mouth 2 (two) times daily before a meal. 180 tablet 1  . insulin glargine (LANTUS) 100 UNIT/ML injection Inject 0.4 mLs (40 Units total) into the skin 2 (two) times daily. 30 mL 5  . Lancets (ONETOUCH DELICA PLUS 123XX123) MISC ONETOUCH VERIO LANCETS USE TO TEST YOUR BLOOD SUGAR TWICE A DAY FINGER STICK    . metoprolol tartrate (LOPRESSOR) 25 MG tablet TAKE 0.5 TABLETS (12.5 MG TOTAL) BY MOUTH 2 (TWO) TIMES DAILY. 90 tablet 1  . nitroGLYCERIN (NITROSTAT) 0.4 MG SL tablet PLACE 1 TABLET (0.4 MG TOTAL) UNDER THE TONGUE EVERY 5 (FIVE) MINUTES AS NEEDED FOR CHEST PAIN. 25 tablet 12  . olmesartan (BENICAR) 20 MG tablet Take 1 tablet (20 mg total) by mouth daily. 90 tablet 1  . ONETOUCH VERIO test strip USE WITH METER TWICE PER DAY DX DM TYPE 2    . Rivaroxaban (XARELTO) 15 MG TABS tablet Take 1 tablet (15 mg total) by mouth daily with supper. 90 tablet 1  . rosuvastatin (CRESTOR) 20 MG tablet TAKE 1 TABLET BY MOUTH EVERY DAY 90 tablet 2   No current facility-administered medications for this visit.     Allergies:   Codeine, Lisinopril, Nsaids, and Latex    Social History:  The patient  reports that he has been smoking cigarettes. He has a 54.00 pack-year smoking history. He has never used smokeless tobacco. He reports current alcohol use. He reports that he does not use drugs.   Family History:  The patient's family history includes Asthma in his brother; Depression in his mother; Diabetes in his brother and sister; Early death in his mother; Heart attack in his father; Heart disease in his  father; Hypertension in his father; Kidney disease in his mother; Ovarian cancer in his mother; Stroke in an other family member.    ROS:  Please see the history of present illness.   Otherwise, review of systems are positive for none.   All other systems are reviewed and negative.    PHYSICAL EXAM: VS:  BP 126/69   Pulse (!) 55   Temp (!) 97 F (36.1 C)   Ht 5\' 8"  (1.727 m)   Wt 178 lb (80.7 kg)   SpO2 99%   BMI 27.06 kg/m  , BMI Body mass index is 27.06 kg/m. GEN: Well nourished, well developed, in no acute distress  HEENT: normal  Neck: no JVD,  or masses .  Bilateral carotid bruits. Cardiac: RRR; no murmurs, rubs, or gallops,no edema  Respiratory:  clear to auscultation bilaterally, normal work of breathing GI: soft, nontender, nondistended, + BS MS: no deformity or atrophy  Skin: warm and dry, no rash Neuro:  Strength and sensation are intact Psych: euthymic mood, full affect  Vascular: Femoral pulses +1 on the right side and absent on the left.  Distal pulses are palpable on the right but not the left.    EKG:  EKG is not ordered today.   Recent Labs: 01/24/2019: TSH 0.60 03/07/2019: Hemoglobin 12.8; Platelets 301.0    Lipid Panel    Component Value Date/Time   CHOL 127 03/16/2018 0909   TRIG 165 (H) 03/16/2018 0909   HDL 45 03/16/2018 0909   CHOLHDL 2.8 03/16/2018 0909   CHOLHDL 4.1 10/12/2017 0411   VLDL 18 10/12/2017 0411   LDLCALC 49 03/16/2018 0909      Wt Readings from Last 3 Encounters:  05/22/19 178 lb (80.7 kg)  05/14/19 174 lb (78.9 kg)  04/03/19 176 lb 6.4 oz (80 kg)        PAD Screen 11/30/2016  Previous PAD dx? Yes  Previous surgical procedure? No  Pain with walking? Yes  Subsides with rest? No  Feet/toe relief with dangling? Yes  Painful, non-healing ulcers? No  Extremities discolored? No      ASSESSMENT AND PLAN:  1.  Peripheral arterial disease: Worsening left leg claudication which is now severe and lifestyle limiting.  This  is happening with minimal exertion.  Rutherford class III.  Suspect left iliac occlusion versus high-grade stenosis.  He has no palpable left femoral artery pulse.  Given significant progression and severity of symptoms, I have recommended proceeding with abdominal aortogram with lower extremity runoff and possible endovascular intervention.  I discussed the procedure in details as well as risks and benefits.  He will require 4 hours of hydration given underlying chronic kidney disease and I might consider using CO2.   Planned access is via the right common femoral artery.  2. Coronary artery disease without angina: Continue medical therapy.  He is on clopidogrel  3. Tobacco use: He reports that he cut down to few cigarettes a day.  I discussed the importance of smoking cessation.  4. Carotid disease: Carotid Doppler progression to 40 to 59% range.  Repeat study in 1 year and continue treatment of risk factors.   5. Hyperlipidemia: Currently on rosuvastatin and Zetia.  Most recent LDL was 49.  6.  Persistent atrial fibrillation: Maintaining in sinus rhythm with dofetilide.  Hold Xarelto 2 days before the procedure.  Disposition:   FU with me in 1 month  Signed,  Kathlyn Sacramento, MD  05/22/2019 8:18 AM    [place]

## 2019-05-22 NOTE — Progress Notes (Signed)
Cardiology Office Note   Date:  05/22/2019   ID:  Marvin Salinas, DOB 22-Jul-1945, MRN [id number]  PCP:  Ma Hillock, DO  Cardiologist:  Dr. Acie Fredrickson  No chief complaint on file.     History of Present Illness: Marvin Salinas is a 73 y.o. male who is here today for a follow-up visit regarding peripheral arterial disease.  He has known history of coronary artery disease status post CABG in 2001, paroxysmal atrial fibrillation on anticoagulation, essential hypertension, carotid disease, hyperlipidemia, diabetes and prior TIAs. Patient has known history of peripheral arterial disease that has been treated medically.   Vascular studies in 2018  showed mildly reduced ABI on the left side at 0.83. Duplex showed significant bilateral iliac disease worse at the left external iliac artery with a peak velocity of 402. There was moderate SFA/popliteal disease with possible significant stenosis affecting the distal left SFA.He does have underlying chronic kidney disease with creatinine between 1.6-2.  He has recent worsening of left leg claudication.  Noninvasive vascular studies were repeated which showed an ABI of 1.21 on the right and 0.66 on the left.  Duplex showed possible short occlusion of the left common iliac artery.  His symptoms worsened in June and he is able to walk only less than half a block before he has to stop and rest.  He describes severe left lower back discomfort radiating to his whole left leg.  No rest pain or lower extremity ulceration.  In addition, he complains of bilateral knee joint discomfort.   Past Medical History:  Diagnosis Date  . Acute kidney injury superimposed on chronic kidney disease ([place]) 10/12/2017  . Atrial fibrillation with RVR ([place]) 10/27/2017  . Back pain   . Basilar artery stenosis    on chronic Plavix  . Benign prostatic hypertrophy without urinary obstruction 04/17/2014  . Carotid artery disease ([place]) 10/06/2010   Carotid US 04/2019: Bilat ICA  40-59; L subclavian stenosis >> repeat 1 year  . Cerebral vascular disease    with prior TIA's; followed by Dr. Erling Cruz  . Depression, neurotic 11/21/2013  . Diabetes mellitus    on insulin  . Enthesopathy of ankle and tarsus 09/04/2007   Overview:  Metatarsalgia   . History of renal calculi   . Hyperlipidemia   . Hypertension   . Ischemic heart disease    prior PCI to RCA in 1989. S/P PCI to LAD and OM in 1992. S/P PCI to first DX in 2000. S/P CABG x 3 in May 2011  . OSA (obstructive sleep apnea) 01/11/2018   AHI 18.1 and SaO2 low 73%  . PAD (peripheral artery disease) ([place]) 11/18/2017   ABIs/Arterial US 04/2019: R 1.21; L 0.66 // R SFA 30-49, stable > 50 CIA and EIA stenosis; L > 50 CIA stenosis (likely represents severe stenosis or short segment occlusion)  . Peripheral neuropathy   . Pneumonia Sept 2012  . Stroke ([place]) 04/16/2001   small right cerebellar infarct on 04/16/2001 at that time he was found to have proximal left vertebral artery, proximal left common carotid artery and both external carotid artery stenosis as well as intracranial stenosis involving mid basilar artery- 07/2011 add questionable TIA    Past Surgical History:  Procedure Laterality Date  . ANGIOPLASTY  1989   right coronary artery  . ANGIOPLASTY  1992   LAD and OM  . ANGIOPLASTY  1998   First DX  . BRAIN SURGERY     on prior  records  . CORONARY ARTERY BYPASS GRAFT  11/12/2009   LIMA to LAD, SVG to OM and SVG to RCA  . CORONARY STENT PLACEMENT  2000   Stent to LAD/Circumflex with angioplasty to first diagonal   . LEFT HEART CATH AND CORS/GRAFTS ANGIOGRAPHY N/A 10/13/2017   Procedure: LEFT HEART CATH AND CORS/GRAFTS ANGIOGRAPHY;  Surgeon: Nelva Bush, MD;  Location: [facility];  Service: Cardiovascular;  Laterality: N/A;     Current Outpatient Medications  Medication Sig Dispense Refill  . BD INSULIN SYRINGE U/F 31G X 5/16" 0.5 ML MISC USE WITH INSULIN TWICE A DAY AS NEEDED    . clopidogrel  (PLAVIX) 75 MG tablet TAKE 1 TABLET BY MOUTH EVERY DAY 90 tablet 3  . dofetilide (TIKOSYN) 125 MCG capsule TAKE 1 CAPSULE BY MOUTH TWICE A DAY 180 capsule 1  . ezetimibe (ZETIA) 10 MG tablet TAKE 1 TABLET BY MOUTH EVERY DAY 90 tablet 0  . gabapentin (NEURONTIN) 100 MG capsule Take 2 capsules (200 mg total) by mouth at bedtime. 180 capsule 1  . glipiZIDE (GLUCOTROL) 5 MG tablet Take 1 tablet (5 mg total) by mouth 2 (two) times daily before a meal. 180 tablet 1  . insulin glargine (LANTUS) 100 UNIT/ML injection Inject 0.4 mLs (40 Units total) into the skin 2 (two) times daily. 30 mL 5  . Lancets (ONETOUCH DELICA PLUS 123XX123) MISC ONETOUCH VERIO LANCETS USE TO TEST YOUR BLOOD SUGAR TWICE A DAY FINGER STICK    . metoprolol tartrate (LOPRESSOR) 25 MG tablet TAKE 0.5 TABLETS (12.5 MG TOTAL) BY MOUTH 2 (TWO) TIMES DAILY. 90 tablet 1  . nitroGLYCERIN (NITROSTAT) 0.4 MG SL tablet PLACE 1 TABLET (0.4 MG TOTAL) UNDER THE TONGUE EVERY 5 (FIVE) MINUTES AS NEEDED FOR CHEST PAIN. 25 tablet 12  . olmesartan (BENICAR) 20 MG tablet Take 1 tablet (20 mg total) by mouth daily. 90 tablet 1  . ONETOUCH VERIO test strip USE WITH METER TWICE PER DAY DX DM TYPE 2    . Rivaroxaban (XARELTO) 15 MG TABS tablet Take 1 tablet (15 mg total) by mouth daily with supper. 90 tablet 1  . rosuvastatin (CRESTOR) 20 MG tablet TAKE 1 TABLET BY MOUTH EVERY DAY 90 tablet 2   No current facility-administered medications for this visit.     Allergies:   Codeine, Lisinopril, Nsaids, and Latex    Social History:  The patient  reports that he has been smoking cigarettes. He has a 54.00 pack-year smoking history. He has never used smokeless tobacco. He reports current alcohol use. He reports that he does not use drugs.   Family History:  The patient's family history includes Asthma in his brother; Depression in his mother; Diabetes in his brother and sister; Early death in his mother; Heart attack in his father; Heart disease in his  father; Hypertension in his father; Kidney disease in his mother; Ovarian cancer in his mother; Stroke in an other family member.    ROS:  Please see the history of present illness.   Otherwise, review of systems are positive for none.   All other systems are reviewed and negative.    PHYSICAL EXAM: VS:  BP 126/69   Pulse (!) 55   Temp (!) 97 F (36.1 C)   Ht 5\' 8"  (1.727 m)   Wt 178 lb (80.7 kg)   SpO2 99%   BMI 27.06 kg/m  , BMI Body mass index is 27.06 kg/m. GEN: Well nourished, well developed, in no acute distress  HEENT: normal  Neck: no JVD,  or masses .  Bilateral carotid bruits. Cardiac: RRR; no murmurs, rubs, or gallops,no edema  Respiratory:  clear to auscultation bilaterally, normal work of breathing GI: soft, nontender, nondistended, + BS MS: no deformity or atrophy  Skin: warm and dry, no rash Neuro:  Strength and sensation are intact Psych: euthymic mood, full affect  Vascular: Femoral pulses +1 on the right side and absent on the left.  Distal pulses are palpable on the right but not the left.    EKG:  EKG is not ordered today.   Recent Labs: 01/24/2019: TSH 0.60 03/07/2019: Hemoglobin 12.8; Platelets 301.0    Lipid Panel    Component Value Date/Time   CHOL 127 03/16/2018 0909   TRIG 165 (H) 03/16/2018 0909   HDL 45 03/16/2018 0909   CHOLHDL 2.8 03/16/2018 0909   CHOLHDL 4.1 10/12/2017 0411   VLDL 18 10/12/2017 0411   LDLCALC 49 03/16/2018 0909      Wt Readings from Last 3 Encounters:  05/22/19 178 lb (80.7 kg)  05/14/19 174 lb (78.9 kg)  04/03/19 176 lb 6.4 oz (80 kg)        PAD Screen 11/30/2016  Previous PAD dx? Yes  Previous surgical procedure? No  Pain with walking? Yes  Subsides with rest? No  Feet/toe relief with dangling? Yes  Painful, non-healing ulcers? No  Extremities discolored? No      ASSESSMENT AND PLAN:  1.  Peripheral arterial disease: Worsening left leg claudication which is now severe and lifestyle limiting.  This  is happening with minimal exertion.  Rutherford class III.  Suspect left iliac occlusion versus high-grade stenosis.  He has no palpable left femoral artery pulse.  Given significant progression and severity of symptoms, I have recommended proceeding with abdominal aortogram with lower extremity runoff and possible endovascular intervention.  I discussed the procedure in details as well as risks and benefits.  He will require 4 hours of hydration given underlying chronic kidney disease and I might consider using CO2.   Planned access is via the right common femoral artery.  2. Coronary artery disease without angina: Continue medical therapy.  He is on clopidogrel  3. Tobacco use: He reports that he cut down to few cigarettes a day.  I discussed the importance of smoking cessation.  4. Carotid disease: Carotid Doppler progression to 40 to 59% range.  Repeat study in 1 year and continue treatment of risk factors.   5. Hyperlipidemia: Currently on rosuvastatin and Zetia.  Most recent LDL was 49.  6.  Persistent atrial fibrillation: Maintaining in sinus rhythm with dofetilide.  Hold Xarelto 2 days before the procedure.  Disposition:   FU with me in 1 month  Signed,  Kathlyn Sacramento, MD  05/22/2019 8:18 AM    [place]

## 2019-05-23 ENCOUNTER — Ambulatory Visit (INDEPENDENT_AMBULATORY_CARE_PROVIDER_SITE_OTHER): Payer: Medicare Other | Admitting: Family Medicine

## 2019-05-23 DIAGNOSIS — D649 Anemia, unspecified: Secondary | ICD-10-CM

## 2019-05-23 DIAGNOSIS — Z794 Long term (current) use of insulin: Secondary | ICD-10-CM | POA: Diagnosis not present

## 2019-05-23 DIAGNOSIS — E1122 Type 2 diabetes mellitus with diabetic chronic kidney disease: Secondary | ICD-10-CM

## 2019-05-23 DIAGNOSIS — N183 Chronic kidney disease, stage 3 unspecified: Secondary | ICD-10-CM

## 2019-05-23 NOTE — Addendum Note (Signed)
Addended by: Ralph Dowdy on: 05/23/2019 08:50 AM   Modules accepted: Orders

## 2019-05-24 ENCOUNTER — Telehealth: Payer: Self-pay | Admitting: Family Medicine

## 2019-05-24 LAB — CBC WITH DIFFERENTIAL/PLATELET
Absolute Monocytes: 551 cells/uL (ref 200–950)
Basophils Absolute: 81 cells/uL (ref 0–200)
Basophils Relative: 1 %
Eosinophils Absolute: 186 cells/uL (ref 15–500)
Eosinophils Relative: 2.3 %
HCT: 40 % (ref 38.5–50.0)
Hemoglobin: 13.1 g/dL — ABNORMAL LOW (ref 13.2–17.1)
Lymphs Abs: 1717 cells/uL (ref 850–3900)
MCH: 29.8 pg (ref 27.0–33.0)
MCHC: 32.8 g/dL (ref 32.0–36.0)
MCV: 90.9 fL (ref 80.0–100.0)
MPV: 10.6 fL (ref 7.5–12.5)
Monocytes Relative: 6.8 %
Neutro Abs: 5565 cells/uL (ref 1500–7800)
Neutrophils Relative %: 68.7 %
Platelets: 326 10*3/uL (ref 140–400)
RBC: 4.4 10*6/uL (ref 4.20–5.80)
RDW: 13.2 % (ref 11.0–15.0)
Total Lymphocyte: 21.2 %
WBC: 8.1 10*3/uL (ref 3.8–10.8)

## 2019-05-24 LAB — EXTRA SPECIMEN

## 2019-05-24 LAB — HEMOGLOBIN A1C
Hgb A1c MFr Bld: 10 % of total Hgb — ABNORMAL HIGH (ref <5.7)
Mean Plasma Glucose: 240 (calc)
eAG (mmol/L): 13.3 (calc)

## 2019-05-24 MED ORDER — INSULIN GLARGINE 100 UNIT/ML ~~LOC~~ SOLN: 45.0000 [IU] | Freq: Two times a day (BID) | SUBCUTANEOUS | 5 refills | Status: DC

## 2019-05-24 NOTE — Telephone Encounter (Signed)
Please inform patient the following information: -His A1c was 10.  Please confirm that he is taking his Lantus 40 units twice a day and his glipizide 5 mg twice a day.  If he confirms that he is doing the above.  Please have him increase his insulin to 45 units twice a day. Highly encouraged him to follow a diabetic diet. - his cbc/blood counts are stable. Please make sure he has follow-up in 3 months.

## 2019-05-24 NOTE — Telephone Encounter (Signed)
Pt was called and given labs results, pt verbalizes understanding. He is taking 40 units BID and Glipizide. He will increase his insulin and watch diet. F/U appt scheduled

## 2019-05-26 ENCOUNTER — Other Ambulatory Visit (HOSPITAL_COMMUNITY)
Admission: RE | Admit: 2019-05-26 | Discharge: 2019-05-26 | Disposition: A | Discharge status: Home / Self Care | Payer: Medicare Other | Source: Ambulatory Visit | Attending: Cardiovascular Disease | Admitting: Cardiovascular Disease

## 2019-05-26 DIAGNOSIS — Z01812 Encounter for preprocedural laboratory examination: Secondary | ICD-10-CM | POA: Diagnosis not present

## 2019-05-26 DIAGNOSIS — Z20828 Contact with and (suspected) exposure to other viral communicable diseases: Secondary | ICD-10-CM | POA: Insufficient documentation

## 2019-05-27 ENCOUNTER — Other Ambulatory Visit: Payer: Self-pay

## 2019-05-28 LAB — NOVEL CORONAVIRUS, NAA (HOSP ORDER, SEND-OUT TO REF LAB; TAT 18-24 HRS): SARS-CoV-2, NAA: NOT DETECTED

## 2019-05-29 ENCOUNTER — Telehealth: Payer: Self-pay | Admitting: *Deleted

## 2019-05-29 NOTE — Telephone Encounter (Signed)
Pt contacted pre-abdominal aortogram scheduled at [facility] for: Wednesday May 30, 2019 12:30 PM Verified arrival time and place: [facility]) at: 7:30 AM-pre procedure hydration   No solid food after midnight prior to cath, clear liquids until 5 AM day of procedure. Contrast allergy: no  Hold: Xarelto-none 05/28/19 until post procedure. Glipizide-AM of procedure. Insulin-AM of procedure. 1/2  Insulin PM prior to procedure.  Except hold medications AM meds can be  taken pre-cath with sip of water including: ASA 81 mg Plavix 75 mg  Confirmed patient has responsible adult to drive home post procedure and observe 24 hours after arriving home: yes  Currently, due to Covid-19 pandemic, only one support person will be allowed with patient. Must be the same support person for that patient's entire stay, will be screened and required to wear a mask. They will be asked to wait in the waiting room for the duration of the patient's stay.  Patients are required to wear a mask when they enter the hospital.      COVID-19 Pre-Screening Questions:  . In the past 7 to 10 days have you had a cough,  shortness of breath, headache, congestion, fever (100 or greater) body aches, chills, sore throat, or sudden loss of taste or sense of smell? no . Have you been around anyone with known Covid 19? no . Have you been around anyone who is awaiting Covid 19 test results in the past 7 to 10 days? no . Have you been around anyone who has been exposed to Covid 19, or has mentioned symptoms of Covid 19 within the past 7 to 10 days? no  I reviewed procedure/mask/visitor instructions, Covid-19 screening questions with patient, he verbalized understanding, thanked me for call.

## 2019-05-30 ENCOUNTER — Other Ambulatory Visit: Payer: Self-pay

## 2019-05-30 ENCOUNTER — Encounter (HOSPITAL_COMMUNITY)
Admission: RE | Disposition: A | Discharge status: Home / Self Care | Payer: Medicare Other | Source: Home / Self Care | Attending: Cardiovascular Disease

## 2019-05-30 ENCOUNTER — Ambulatory Visit (HOSPITAL_COMMUNITY)
Admission: RE | Admit: 2019-05-30 | Discharge: 2019-05-30 | Disposition: A | Discharge status: Home / Self Care | Payer: Medicare Other | Attending: Cardiovascular Disease | Admitting: Cardiovascular Disease

## 2019-05-30 DIAGNOSIS — Z886 Allergy status to analgesic agent status: Secondary | ICD-10-CM | POA: Diagnosis not present

## 2019-05-30 DIAGNOSIS — F17210 Nicotine dependence, cigarettes, uncomplicated: Secondary | ICD-10-CM | POA: Insufficient documentation

## 2019-05-30 DIAGNOSIS — E1151 Type 2 diabetes mellitus with diabetic peripheral angiopathy without gangrene: Secondary | ICD-10-CM | POA: Insufficient documentation

## 2019-05-30 DIAGNOSIS — N189 Chronic kidney disease, unspecified: Secondary | ICD-10-CM | POA: Diagnosis not present

## 2019-05-30 DIAGNOSIS — Z955 Presence of coronary angioplasty implant and graft: Secondary | ICD-10-CM | POA: Insufficient documentation

## 2019-05-30 DIAGNOSIS — E1122 Type 2 diabetes mellitus with diabetic chronic kidney disease: Secondary | ICD-10-CM | POA: Diagnosis not present

## 2019-05-30 DIAGNOSIS — Z79899 Other long term (current) drug therapy: Secondary | ICD-10-CM | POA: Diagnosis not present

## 2019-05-30 DIAGNOSIS — I745 Embolism and thrombosis of iliac artery: Secondary | ICD-10-CM | POA: Insufficient documentation

## 2019-05-30 DIAGNOSIS — Z8249 Family history of ischemic heart disease and other diseases of the circulatory system: Secondary | ICD-10-CM | POA: Diagnosis not present

## 2019-05-30 DIAGNOSIS — Z833 Family history of diabetes mellitus: Secondary | ICD-10-CM | POA: Insufficient documentation

## 2019-05-30 DIAGNOSIS — Z794 Long term (current) use of insulin: Secondary | ICD-10-CM | POA: Insufficient documentation

## 2019-05-30 DIAGNOSIS — E1142 Type 2 diabetes mellitus with diabetic polyneuropathy: Secondary | ICD-10-CM | POA: Insufficient documentation

## 2019-05-30 DIAGNOSIS — Z888 Allergy status to other drugs, medicaments and biological substances status: Secondary | ICD-10-CM | POA: Diagnosis not present

## 2019-05-30 DIAGNOSIS — I70212 Atherosclerosis of native arteries of extremities with intermittent claudication, left leg: Secondary | ICD-10-CM | POA: Insufficient documentation

## 2019-05-30 DIAGNOSIS — Z8673 Personal history of transient ischemic attack (TIA), and cerebral infarction without residual deficits: Secondary | ICD-10-CM | POA: Insufficient documentation

## 2019-05-30 DIAGNOSIS — Z823 Family history of stroke: Secondary | ICD-10-CM | POA: Insufficient documentation

## 2019-05-30 DIAGNOSIS — Z951 Presence of aortocoronary bypass graft: Secondary | ICD-10-CM | POA: Diagnosis not present

## 2019-05-30 DIAGNOSIS — Z885 Allergy status to narcotic agent status: Secondary | ICD-10-CM | POA: Diagnosis not present

## 2019-05-30 DIAGNOSIS — Z7902 Long term (current) use of antithrombotics/antiplatelets: Secondary | ICD-10-CM | POA: Diagnosis not present

## 2019-05-30 DIAGNOSIS — I4819 Other persistent atrial fibrillation: Secondary | ICD-10-CM | POA: Insufficient documentation

## 2019-05-30 DIAGNOSIS — E785 Hyperlipidemia, unspecified: Secondary | ICD-10-CM | POA: Diagnosis not present

## 2019-05-30 DIAGNOSIS — I2510 Atherosclerotic heart disease of native coronary artery without angina pectoris: Secondary | ICD-10-CM | POA: Diagnosis not present

## 2019-05-30 DIAGNOSIS — N400 Enlarged prostate without lower urinary tract symptoms: Secondary | ICD-10-CM | POA: Insufficient documentation

## 2019-05-30 DIAGNOSIS — G4733 Obstructive sleep apnea (adult) (pediatric): Secondary | ICD-10-CM | POA: Diagnosis not present

## 2019-05-30 DIAGNOSIS — I129 Hypertensive chronic kidney disease with stage 1 through stage 4 chronic kidney disease, or unspecified chronic kidney disease: Secondary | ICD-10-CM | POA: Insufficient documentation

## 2019-05-30 DIAGNOSIS — I739 Peripheral vascular disease, unspecified: Secondary | ICD-10-CM

## 2019-05-30 HISTORY — PX: ABDOMINAL AORTOGRAM W/LOWER EXTREMITY: CATH118223

## 2019-05-30 LAB — GLUCOSE, CAPILLARY
Glucose-Capillary: 196 mg/dL — ABNORMAL HIGH (ref 70–99)
Glucose-Capillary: 112 mg/dL — ABNORMAL HIGH (ref 70–99)

## 2019-05-30 LAB — POCT ACTIVATED CLOTTING TIME: Activated Clotting Time: 246 seconds

## 2019-05-30 SURGERY — ABDOMINAL AORTOGRAM W/LOWER EXTREMITY
Anesthesia: LOCAL | Laterality: Bilateral

## 2019-05-30 MED ORDER — ACETAMINOPHEN 325 MG PO TABS: 650.0000 mg | ORAL_TABLET | ORAL | Status: DC | PRN

## 2019-05-30 MED ORDER — SODIUM CHLORIDE 0.9% FLUSH: 3.0000 mL | INTRAVENOUS | Status: DC | PRN

## 2019-05-30 MED ORDER — HEPARIN (PORCINE) IN NACL 1000-0.9 UT/500ML-% IV SOLN
INTRAVENOUS | Status: DC | PRN
  Administered 2019-05-30 (×2): 500 mL

## 2019-05-30 MED ORDER — IODIXANOL 320 MG/ML IV SOLN
INTRAVENOUS | Status: DC | PRN
  Administered 2019-05-30: 70 mL via INTRA_ARTERIAL

## 2019-05-30 MED ORDER — HEPARIN SODIUM (PORCINE) 1000 UNIT/ML IJ SOLN
INTRAMUSCULAR | Status: AC
  Filled 2019-05-30: qty 1

## 2019-05-30 MED ORDER — FENTANYL CITRATE (PF) 100 MCG/2ML IJ SOLN
INTRAMUSCULAR | Status: AC
  Filled 2019-05-30: qty 2

## 2019-05-30 MED ORDER — ONDANSETRON HCL 4 MG/2ML IJ SOLN: 4.0000 mg | Freq: Four times a day (QID) | INTRAMUSCULAR | Status: DC | PRN

## 2019-05-30 MED ORDER — HYDRALAZINE HCL 20 MG/ML IJ SOLN: 5.0000 mg | INTRAMUSCULAR | Status: DC | PRN

## 2019-05-30 MED ORDER — MIDAZOLAM HCL 2 MG/2ML IJ SOLN
INTRAMUSCULAR | Status: DC | PRN
  Administered 2019-05-30 (×2): 1 mg via INTRAVENOUS

## 2019-05-30 MED ORDER — HEPARIN SODIUM (PORCINE) 1000 UNIT/ML IJ SOLN
INTRAMUSCULAR | Status: DC | PRN
  Administered 2019-05-30: 7000 [IU] via INTRAVENOUS

## 2019-05-30 MED ORDER — SODIUM CHLORIDE 0.9 % IV SOLN: 250.0000 mL | INTRAVENOUS | Status: DC | PRN

## 2019-05-30 MED ORDER — HEPARIN (PORCINE) IN NACL 1000-0.9 UT/500ML-% IV SOLN
INTRAVENOUS | Status: AC
  Filled 2019-05-30: qty 500

## 2019-05-30 MED ORDER — ASPIRIN 81 MG PO CHEW: 81.0000 mg | CHEWABLE_TABLET | ORAL | Status: DC

## 2019-05-30 MED ORDER — LABETALOL HCL 5 MG/ML IV SOLN: 10.0000 mg | INTRAVENOUS | Status: DC | PRN

## 2019-05-30 MED ORDER — SODIUM CHLORIDE 0.9 % WEIGHT BASED INFUSION
3.0000 mL/kg/h | INTRAVENOUS | Status: AC
  Administered 2019-05-30: 3 mL/kg/h via INTRAVENOUS

## 2019-05-30 MED ORDER — SODIUM CHLORIDE 0.9 % IV SOLN: INTRAVENOUS | Status: DC

## 2019-05-30 MED ORDER — LIDOCAINE HCL (PF) 1 % IJ SOLN
INTRAMUSCULAR | Status: AC
  Filled 2019-05-30: qty 30

## 2019-05-30 MED ORDER — LIDOCAINE HCL (PF) 1 % IJ SOLN
INTRAMUSCULAR | Status: DC | PRN
  Administered 2019-05-30 (×2): 15 mL via INTRADERMAL

## 2019-05-30 MED ORDER — SODIUM CHLORIDE 0.9% FLUSH: 3.0000 mL | Freq: Two times a day (BID) | INTRAVENOUS | Status: DC

## 2019-05-30 MED ORDER — SODIUM CHLORIDE 0.9 % WEIGHT BASED INFUSION: 1.0000 mL/kg/h | INTRAVENOUS | Status: DC

## 2019-05-30 MED ORDER — MIDAZOLAM HCL 2 MG/2ML IJ SOLN
INTRAMUSCULAR | Status: AC
  Filled 2019-05-30: qty 2

## 2019-05-30 MED ORDER — FENTANYL CITRATE (PF) 100 MCG/2ML IJ SOLN
INTRAMUSCULAR | Status: DC | PRN
  Administered 2019-05-30 (×2): 25 ug via INTRAVENOUS

## 2019-05-30 SURGICAL SUPPLY — 20 items
BALLN MUSTANG 6.0X40 75 (BALLOONS) ×2
BALLOON MUSTANG 6.0X40 75 (BALLOONS) IMPLANT
CATH ANGIO 5F PIGTAIL 65CM (CATHETERS) ×1 IMPLANT
CLOSURE MYNX CONTROL 5F (Vascular Products) ×1 IMPLANT
CLOSURE MYNX CONTROL 6F/7F (Vascular Products) ×1 IMPLANT
GLIDEWIRE ADV .035X180CM (WIRE) ×1 IMPLANT
KIT ENCORE 26 ADVANTAGE (KITS) ×1 IMPLANT
KIT MICROPUNCTURE NIT STIFF (SHEATH) ×1 IMPLANT
KIT PV (KITS) ×2 IMPLANT
SHEATH BRITE TIP 7FR 35CM (SHEATH) ×1 IMPLANT
SHEATH PINNACLE 5F 10CM (SHEATH) ×1 IMPLANT
SHEATH PINNACLE 7F 10CM (SHEATH) ×1 IMPLANT
SHEATH PROBE COVER 6X72 (BAG) ×1 IMPLANT
STENT EXPRESS LD 8X37X75 (Permanent Stent) ×1 IMPLANT
STOPCOCK MORSE 400PSI 3WAY (MISCELLANEOUS) ×2 IMPLANT
SYR MEDRAD MARK 7 150ML (SYRINGE) ×2 IMPLANT
TRANSDUCER W/STOPCOCK (MISCELLANEOUS) ×2 IMPLANT
TRAY PV CATH (CUSTOM PROCEDURE TRAY) ×2 IMPLANT
TUBING CIL FLEX 10 FLL-RA (TUBING) ×2 IMPLANT
WIRE HITORQ VERSACORE ST 145CM (WIRE) ×1 IMPLANT

## 2019-05-30 NOTE — Discharge Instructions (Signed)
Resume Xarelto tomorrow as long as no bleeding issues from the groin  Angiogram, Care After This sheet gives you information about how to care for yourself after your procedure. Your health care provider may also give you more specific instructions. If you have problems or questions, contact your health care provider. What can I expect after the procedure? After the procedure, it is common to have bruising and tenderness at the catheter insertion area. Follow these instructions at home: Insertion site care  Follow instructions from your health care provider about how to take care of your insertion site. Make sure you: ? Wash your hands with soap and water before you change your bandage (dressing). If soap and water are not available, use hand sanitizer. ? Change your dressing as told by your health care provider. ? Leave stitches (sutures), skin glue, or adhesive strips in place. These skin closures may need to stay in place for 2 weeks or longer. If adhesive strip edges start to loosen and curl up, you may trim the loose edges. Do not remove adhesive strips completely unless your health care provider tells you to do that.  Do not take baths, swim, or use a hot tub until your health care provider approves.  You may shower 24-48 hours after the procedure or as told by your health care provider. ? Gently wash the site with plain soap and water. ? Pat the area dry with a clean towel. ? Do not rub the site. This may cause bleeding.  Do not apply powder or lotion to the site. Keep the site clean and dry.  Check your insertion site every day for signs of infection. Check for: ? Redness, swelling, or pain. ? Fluid or blood. ? Warmth. ? Pus or a bad smell. Activity  Rest as told by your health care provider, usually for 1-2 days.  Do not lift anything that is heavier than 10 lbs. (4.5 kg) or as told by your health care provider.  Do not drive for 24 hours if you were given a medicine to  help you relax (sedative).  Do not drive or use heavy machinery while taking prescription pain medicine. General instructions   Return to your normal activities as told by your health care provider, usually in about a week. Ask your health care provider what activities are safe for you.  If the catheter site starts bleeding, lie flat and put pressure on the site. If the bleeding does not stop, get help right away. This is a medical emergency.  Drink enough fluid to keep your urine clear or pale yellow. This helps flush the contrast dye from your body.  Take over-the-counter and prescription medicines only as told by your health care provider.  Keep all follow-up visits as told by your health care provider. This is important. Contact a health care provider if:  You have a fever or chills.  You have redness, swelling, or pain around your insertion site.  You have fluid or blood coming from your insertion site.  The insertion site feels warm to the touch.  You have pus or a bad smell coming from your insertion site.  You have bruising around the insertion site.  You notice blood collecting in the tissue around the catheter site (hematoma). The hematoma may be painful to the touch. Get help right away if:  You have severe pain at the catheter insertion area.  The catheter insertion area swells very fast.  The catheter insertion area is bleeding, and  the bleeding does not stop when you hold steady pressure on the area.  The area near or just beyond the catheter insertion site becomes pale, cool, tingly, or numb. These symptoms may represent a serious problem that is an emergency. Do not wait to see if the symptoms will go away. Get medical help right away. Call your local emergency services (911 in the [place] Do not drive yourself to the hospital. Summary  After the procedure, it is common to have bruising and tenderness at the catheter insertion area.  After the procedure, it  is important to rest and drink plenty of fluids.  Do not take baths, swim, or use a hot tub until your health care provider says it is okay to do so. You may shower 24-48 hours after the procedure or as told by your health care provider.  If the catheter site starts bleeding, lie flat and put pressure on the site. If the bleeding does not stop, get help right away. This is a medical emergency. This information is not intended to replace advice given to you by your health care provider. Make sure you discuss any questions you have with your health care provider. Document Released: 12/24/2004 Document Revised: 05/20/2017 Document Reviewed: 05/12/2016 Elsevier Patient Education  2020 [employer].

## 2019-05-30 NOTE — Interval H&P Note (Signed)
History and Physical Interval Note:  05/30/2019 11:31 AM  Marvin Salinas  has presented today for surgery, with the diagnosis of PAD.  The various methods of treatment have been discussed with the patient and family. After consideration of risks, benefits and other options for treatment, the patient has consented to  Procedure(s): ABDOMINAL AORTOGRAM W/LOWER EXTREMITY (Bilateral) as a surgical intervention.  The patient's history has been reviewed, patient examined, no change in status, stable for surgery.  I have reviewed the patient's chart and labs.  Questions were answered to the patient's satisfaction.     Kathlyn Sacramento

## 2019-05-31 ENCOUNTER — Telehealth: Payer: Self-pay | Admitting: *Deleted

## 2019-05-31 DIAGNOSIS — N1831 Chronic kidney disease, stage 3a: Secondary | ICD-10-CM

## 2019-05-31 NOTE — Telephone Encounter (Signed)
The patient has been made aware to come in tomorrow for a repeat BMET. He will come to the [place] office.  Appointment has been made for a follow up with Dr. Fletcher Anon.

## 2019-05-31 NOTE — Telephone Encounter (Signed)
-----   Message from Wellington Hampshire, MD sent at 05/30/2019  3:15 PM EST ----- Status post left common iliac artery stent placement today.  He is already scheduled for an ABI and aortoiliac duplex. Schedule him for basic metabolic profile to be done in 2 days from now on Friday given underlying chronic kidney disease and contrast use today. He needs a follow-up appointment with me in 3 to 4 weeks

## 2019-06-01 DIAGNOSIS — N1831 Chronic kidney disease, stage 3a: Secondary | ICD-10-CM | POA: Diagnosis not present

## 2019-06-02 LAB — BASIC METABOLIC PANEL WITH GFR
BUN/Creatinine Ratio: 15 (ref 10–24)
BUN: 20 mg/dL (ref 8–27)
CO2: 22 mmol/L (ref 20–29)
Calcium: 9.6 mg/dL (ref 8.6–10.2)
Chloride: 100 mmol/L (ref 96–106)
Creatinine, Ser: 1.35 mg/dL — ABNORMAL HIGH (ref 0.76–1.27)
GFR calc Af Amer: 59 mL/min/{1.73_m2} — ABNORMAL LOW
GFR calc non Af Amer: 51 mL/min/{1.73_m2} — ABNORMAL LOW
Glucose: 211 mg/dL — ABNORMAL HIGH (ref 65–99)
Potassium: 5 mmol/L (ref 3.5–5.2)
Sodium: 137 mmol/L (ref 134–144)

## 2019-06-05 ENCOUNTER — Other Ambulatory Visit: Payer: Self-pay | Admitting: Pharmacist Clinician (PhC)/ Clinical Pharmacy Specialist

## 2019-06-05 ENCOUNTER — Other Ambulatory Visit: Payer: Self-pay | Admitting: Cardiovascular Disease

## 2019-06-05 MED ORDER — RIVAROXABAN 20 MG PO TABS: 20.0000 mg | ORAL_TABLET | Freq: Every day | ORAL | 1 refills | Status: DC

## 2019-06-05 NOTE — Telephone Encounter (Signed)
73m 80.7kg 1.35 06/01/19 ccr 54.8 Lovw/arida 05/22/19 PT REQUESTING 15MG  XARELTO BUT QUALIFIES FOR 20 WILL ROUTE TO PHARMD POOL FOR FURTHER REVIEW

## 2019-06-05 NOTE — Telephone Encounter (Signed)
Reviewed - when pt started med, SCr was running in the 1.8-2 range.  Much improved.  Patient aware of dose increase and reason

## 2019-06-06 ENCOUNTER — Telehealth: Payer: Self-pay | Admitting: Cardiovascular Disease

## 2019-06-06 NOTE — Telephone Encounter (Signed)
New Message    Pt is wondering when he can take off his bandages, he says he has had them for a week now.    Please call

## 2019-06-06 NOTE — Telephone Encounter (Signed)
The patient has been made aware that he can take the bandage off. He has been advised to call if he sees any redness or discolored drainage.

## 2019-06-07 ENCOUNTER — Ambulatory Visit (HOSPITAL_COMMUNITY): Payer: Medicare Other | Attending: Cardiology

## 2019-06-07 ENCOUNTER — Ambulatory Visit (HOSPITAL_COMMUNITY)
Admission: RE | Admit: 2019-06-07 | Payer: Medicare Other | Source: Ambulatory Visit | Attending: Cardiovascular Disease | Admitting: Cardiovascular Disease

## 2019-06-08 ENCOUNTER — Other Ambulatory Visit (HOSPITAL_COMMUNITY): Payer: Self-pay | Admitting: Cardiovascular Disease

## 2019-06-08 DIAGNOSIS — I739 Peripheral vascular disease, unspecified: Secondary | ICD-10-CM

## 2019-06-11 ENCOUNTER — Other Ambulatory Visit: Payer: Self-pay | Admitting: Cardiology

## 2019-06-20 ENCOUNTER — Telehealth: Payer: Self-pay

## 2019-06-20 MED ORDER — "BD INSULIN SYRINGE U/F 31G X 5/16"" 0.5 ML MISC": 1 refills | Status: DC

## 2019-06-20 NOTE — Telephone Encounter (Signed)
Patient is request syringes for diabetic medication to be sent to [facility].

## 2019-06-20 NOTE — Telephone Encounter (Signed)
RX sent

## 2019-06-21 ENCOUNTER — Other Ambulatory Visit: Payer: Self-pay

## 2019-06-21 ENCOUNTER — Ambulatory Visit (HOSPITAL_COMMUNITY)
Admission: RE | Admit: 2019-06-21 | Discharge: 2019-06-21 | Disposition: A | Discharge status: Home / Self Care | Payer: Medicare Other | Source: Ambulatory Visit | Attending: Cardiovascular Disease | Admitting: Cardiovascular Disease

## 2019-06-21 ENCOUNTER — Ambulatory Visit (HOSPITAL_BASED_OUTPATIENT_CLINIC_OR_DEPARTMENT_OTHER)
Admission: RE | Admit: 2019-06-21 | Discharge: 2019-06-21 | Disposition: A | Discharge status: Home / Self Care | Payer: Medicare Other | Source: Ambulatory Visit | Attending: Cardiovascular Disease | Admitting: Cardiovascular Disease

## 2019-06-21 ENCOUNTER — Other Ambulatory Visit (HOSPITAL_COMMUNITY): Payer: Self-pay | Admitting: Cardiovascular Disease

## 2019-06-21 DIAGNOSIS — I739 Peripheral vascular disease, unspecified: Secondary | ICD-10-CM | POA: Diagnosis not present

## 2019-06-21 DIAGNOSIS — Z95828 Presence of other vascular implants and grafts: Secondary | ICD-10-CM

## 2019-06-26 ENCOUNTER — Other Ambulatory Visit: Payer: Self-pay

## 2019-06-26 ENCOUNTER — Ambulatory Visit: Payer: Medicare Other | Admitting: Cardiovascular Disease

## 2019-06-26 ENCOUNTER — Encounter: Payer: Self-pay | Admitting: Cardiovascular Disease

## 2019-06-26 VITALS — BP 148/74 | HR 55 | Temp 97.9°F | Ht 68.0 in | Wt 182.4 lb

## 2019-06-26 DIAGNOSIS — I739 Peripheral vascular disease, unspecified: Secondary | ICD-10-CM

## 2019-06-26 DIAGNOSIS — I2510 Atherosclerotic heart disease of native coronary artery without angina pectoris: Secondary | ICD-10-CM | POA: Diagnosis not present

## 2019-06-26 DIAGNOSIS — I779 Disorder of arteries and arterioles, unspecified: Secondary | ICD-10-CM | POA: Diagnosis not present

## 2019-06-26 DIAGNOSIS — E785 Hyperlipidemia, unspecified: Secondary | ICD-10-CM

## 2019-06-26 NOTE — Patient Instructions (Signed)
Medication Instructions:  Your physician recommends that you continue on your current medications as directed. Please refer to the Current Medication list given to you today.     *If you need a refill on your cardiac medications before your next appointment, please call your pharmacy*  Lab Work: NONE  Testing/Procedures: REPEAT DOPPLERS IN 6 MONTHS, THE OFFICE WILL CONTACT WHEN TIME TO SCHEDULE  Follow-Up:   Your next appointment:   6 month(s)  AFTER YOUR DOPPLERS  You will receive a reminder letter in the mail two months in advance. If you don't receive a letter, please call our office to schedule the follow-up appointment.  The format for your next appointment:   In Person  Provider:   Dr Fletcher Anon

## 2019-06-26 NOTE — Progress Notes (Signed)
Cardiology Office Note   Date:  06/26/2019   ID:  Marvin Salinas, DOB [birth year], MRN [id number]  PCP:  Ma Hillock, DO  Cardiologist:  Dr. Acie Fredrickson  No chief complaint on file.     History of Present Illness: Marvin Salinas is a 74 y.o. male who is here today for a follow-up visit regarding peripheral arterial disease.  He has known history of coronary artery disease status post CABG in 2001, paroxysmal atrial fibrillation on anticoagulation, essential hypertension, carotid disease, hyperlipidemia, diabetes and prior TIAs. Patient has known history of peripheral arterial disease .  He does have underlying chronic kidney disease with creatinine between 1.6-2.  He has recent worsening of left leg claudication.  Noninvasive vascular studies  showed an ABI of 1.21 on the right and 0.66 on the left.  Duplex showed possible short occlusion of the left common iliac artery.  I proceeded with angiography last month which showed heavily calcified subtotal occlusion of the left common iliac artery with moderate disease affecting the right common iliac artery.  I performed successful angioplasty and and balloon expandable stent placement to the left common iliac artery.  He has been doing well since then with resolution of left leg claudication.  Doppler studies showed improvement in ABI to normal on the left. He continues to complain of lower back pain and bilateral knee joint pain. He has been under stress lately after the death death of his brother from COVID-56.   Past Medical History:  Diagnosis Date  . Acute kidney injury superimposed on chronic kidney disease ([place]) 10/12/2017  . Atrial fibrillation with RVR ([place]) 10/27/2017  . Back pain   . Basilar artery stenosis    on chronic Plavix  . Benign prostatic hypertrophy without urinary obstruction 04/17/2014  . Carotid artery disease ([place]) 10/06/2010   Carotid US 04/2019: Bilat ICA 40-59; L subclavian stenosis >> repeat 1 year  . Cerebral  vascular disease    with prior TIA's; followed by Dr. Erling Cruz  . Depression, neurotic 11/21/2013  . Diabetes mellitus    on insulin  . Enthesopathy of ankle and tarsus 09/04/2007   Overview:  Metatarsalgia   . History of renal calculi   . Hyperlipidemia   . Hypertension   . Ischemic heart disease    prior PCI to RCA in 1989. S/P PCI to LAD and OM in 1992. S/P PCI to first DX in 2000. S/P CABG x 3 in May 2011  . OSA (obstructive sleep apnea) 01/11/2018   AHI 18.1 and SaO2 low 73%  . PAD (peripheral artery disease) ([place]) 11/18/2017   ABIs/Arterial US 04/2019: R 1.21; L 0.66 // R SFA 30-49, stable > 50 CIA and EIA stenosis; L > 50 CIA stenosis (likely represents severe stenosis or short segment occlusion)  . Peripheral neuropathy   . Pneumonia Sept 2012  . Stroke ([place]) 04/16/2001   small right cerebellar infarct on 04/16/2001 at that time he was found to have proximal left vertebral artery, proximal left common carotid artery and both external carotid artery stenosis as well as intracranial stenosis involving mid basilar artery- 07/2011 add questionable TIA    Past Surgical History:  Procedure Laterality Date  . ABDOMINAL AORTOGRAM W/LOWER EXTREMITY Bilateral 05/30/2019   Procedure: ABDOMINAL AORTOGRAM W/LOWER EXTREMITY;  Surgeon: Wellington Hampshire, MD;  Location: [facility];  Service: Cardiovascular;  Laterality: Bilateral;  . ANGIOPLASTY  1989   right coronary artery  . ANGIOPLASTY  1992   LAD  and OM  . ANGIOPLASTY  1998   First DX  . BRAIN SURGERY     on prior records  . CORONARY ARTERY BYPASS GRAFT  11/12/2009   LIMA to LAD, SVG to OM and SVG to RCA  . CORONARY STENT PLACEMENT  2000   Stent to LAD/Circumflex with angioplasty to first diagonal   . LEFT HEART CATH AND CORS/GRAFTS ANGIOGRAPHY N/A 10/13/2017   Procedure: LEFT HEART CATH AND CORS/GRAFTS ANGIOGRAPHY;  Surgeon: Nelva Bush, MD;  Location: [facility];  Service: Cardiovascular;  Laterality: N/A;      Current Outpatient Medications  Medication Sig Dispense Refill  . acetaminophen (TYLENOL) 325 MG tablet Take 325 mg by mouth every 6 (six) hours as needed for moderate pain or headache.    . BD INSULIN SYRINGE U/F 31G X 5/16" 0.5 ML MISC Inject 0.45 mLs of insulin (45 Units total) into the skin 2 (two) times daily. DX E11.22 180 each 1  . Carboxymethylcell-Hypromellose (GENTEAL OP) Place 1 drop into both eyes daily as needed (dry eyes).    . clopidogrel (PLAVIX) 75 MG tablet TAKE 1 TABLET BY MOUTH EVERY DAY (Patient taking differently: Take 75 mg by mouth daily. ) 90 tablet 3  . dofetilide (TIKOSYN) 125 MCG capsule TAKE 1 CAPSULE BY MOUTH TWICE A DAY (Patient taking differently: Take 125 mcg by mouth 2 (two) times daily. ) 180 capsule 1  . ezetimibe (ZETIA) 10 MG tablet TAKE 1 TABLET BY MOUTH EVERY DAY 90 tablet 0  . gabapentin (NEURONTIN) 100 MG capsule Take 2 capsules (200 mg total) by mouth at bedtime. 180 capsule 1  . glipiZIDE (GLUCOTROL) 5 MG tablet Take 1 tablet (5 mg total) by mouth 2 (two) times daily before a meal. 180 tablet 1  . insulin glargine (LANTUS) 100 UNIT/ML injection Inject 0.45 mLs (45 Units total) into the skin 2 (two) times daily. 30 mL 5  . Lancets (ONETOUCH DELICA PLUS 123XX123) MISC ONETOUCH VERIO LANCETS USE TO TEST YOUR BLOOD SUGAR TWICE A DAY FINGER STICK    . loratadine (CLARITIN) 10 MG tablet Take 10 mg by mouth daily as needed for allergies.    . metoprolol tartrate (LOPRESSOR) 25 MG tablet TAKE 0.5 TABLETS (12.5 MG TOTAL) BY MOUTH 2 (TWO) TIMES DAILY. 90 tablet 1  . nitroGLYCERIN (NITROSTAT) 0.4 MG SL tablet PLACE 1 TABLET (0.4 MG TOTAL) UNDER THE TONGUE EVERY 5 (FIVE) MINUTES AS NEEDED FOR CHEST PAIN. 25 tablet 12  . olmesartan (BENICAR) 20 MG tablet Take 1 tablet (20 mg total) by mouth daily. 90 tablet 1  . Omega-3 Fatty Acids (FISH OIL) 1000 MG CAPS Take by mouth.    Glory Rosebush VERIO test strip USE WITH METER TWICE PER DAY DX DM TYPE 2    . rivaroxaban  (XARELTO) 20 MG TABS tablet Take 1 tablet (20 mg total) by mouth daily with supper. 90 tablet 1  . rosuvastatin (CRESTOR) 20 MG tablet TAKE 1 TABLET BY MOUTH EVERY DAY (Patient taking differently: Take 20 mg by mouth at bedtime. ) 90 tablet 2   No current facility-administered medications for this visit.    Allergies:   Codeine, Lisinopril, Nsaids, and Latex    Social History:  The patient  reports that he has been smoking cigarettes. He has a 54.00 pack-year smoking history. He has never used smokeless tobacco. He reports current alcohol use. He reports that he does not use drugs.   Family History:  The patient's family history includes Asthma in  his brother; Depression in his mother; Diabetes in his brother and sister; Early death in his mother; Heart attack in his father; Heart disease in his father; Hypertension in his father; Kidney disease in his mother; Ovarian cancer in his mother; Stroke in an other family member.    ROS:  Please see the history of present illness.   Otherwise, review of systems are positive for none.   All other systems are reviewed and negative.    PHYSICAL EXAM: VS:  BP (!) 148/74   Pulse (!) 55   Temp 97.9 F (36.6 C)   Ht 5\' 8"  (1.727 m)   Wt 182 lb 6.4 oz (82.7 kg)   SpO2 100%   BMI 27.73 kg/m  , BMI Body mass index is 27.73 kg/m. GEN: Well nourished, well developed, in no acute distress  HEENT: normal  Neck: no JVD,  or masses .  Bilateral carotid bruits. Cardiac: RRR; no murmurs, rubs, or gallops,no edema  Respiratory:  clear to auscultation bilaterally, normal work of breathing GI: soft, nontender, nondistended, + BS MS: no deformity or atrophy  Skin: warm and dry, no rash Neuro:  Strength and sensation are intact Psych: euthymic mood, full affect  Vascular: Femoral pulses are slightly diminished bilaterally.  No groin hematoma.   EKG:  EKG is not ordered today.   Recent Labs: 01/24/2019: TSH 0.60 05/23/2019: Hemoglobin 13.1; Platelets 326  06/01/2019: BUN 20; Creatinine, Ser 1.35; Potassium 5.0; Sodium 137    Lipid Panel    Component Value Date/Time   CHOL 127 03/16/2018 0909   TRIG 165 (H) 03/16/2018 0909   HDL 45 03/16/2018 0909   CHOLHDL 2.8 03/16/2018 0909   CHOLHDL 4.1 10/12/2017 0411   VLDL 18 10/12/2017 0411   LDLCALC 49 03/16/2018 0909      Wt Readings from Last 3 Encounters:  06/26/19 182 lb 6.4 oz (82.7 kg)  05/30/19 174 lb (78.9 kg)  05/22/19 178 lb (80.7 kg)        PAD Screen 11/30/2016  Previous PAD dx? Yes  Previous surgical procedure? No  Pain with walking? Yes  Subsides with rest? No  Feet/toe relief with dangling? Yes  Painful, non-healing ulcers? No  Extremities discolored? No      ASSESSMENT AND PLAN:  1.  Peripheral arterial disease: Status post recent successful stent placement to the left common iliac artery for subtotal occlusion.  Improved ABI to normal.  Repeat vascular studies in 6 months.  2. Coronary artery disease without angina: Continue medical therapy.  He is on clopidogrel  3. Tobacco use: He reports that he cut down to few cigarettes a day.  I discussed the importance of smoking cessation.  4. Carotid disease: Carotid Doppler progression to 40 to 59% range.  Repeat study in 1 year and continue treatment of risk factors.   5. Hyperlipidemia: Currently on rosuvastatin and Zetia.  Most recent LDL was 49.  6.  Persistent atrial fibrillation: Maintaining in sinus rhythm with dofetilide.  On long-term anticoagulation with Xarelto with no bleeding issues.     Disposition:   FU with me in 6 month  Signed,  Kathlyn Sacramento, MD  06/26/2019 8:41 AM    [place]

## 2019-07-27 ENCOUNTER — Telehealth: Payer: Self-pay | Admitting: Cardiovascular Disease

## 2019-07-27 NOTE — Telephone Encounter (Signed)
      [facility] Pre-operative Risk Assessment    Request for surgical clearance:  1. What type of surgery is being performed? Tooth extraction  2. When is this surgery scheduled? Tuesday 02-09  3. What type of clearance is required (medical clearance vs. Pharmacy clearance to hold med vs. Both)? Pharmacy  4. Are there any medications that need to be held prior to surgery and how long?Plavix   5. Practice name and name of physician performing surgery? [facility]  6. What is your office phone number: [phone]   7.   What is your office fax number: pt does not know  8.   Anesthesia type (None, local, MAC, general) ?: pt does not know   Marvin Salinas 07/27/2019, 3:25 PM  _________________________________________________________________   (provider comments below)

## 2019-07-27 NOTE — Telephone Encounter (Signed)
Mertie Moores, MD  Dr Kathlyn Sacramento, PAD  Will route clearance request to Dr Acie Fredrickson to address holding Plavix. Will route clearance request to Pharmacy to address Xarelto.  Rosaria Ferries, PA-C 07/27/2019 4:23 PM

## 2019-07-27 NOTE — Telephone Encounter (Signed)
No need to hold Xarelto for 1-2 teeth extraction

## 2019-07-30 NOTE — Telephone Encounter (Signed)
Patient may hold Xarelto for 1 day prior

## 2019-07-30 NOTE — Telephone Encounter (Signed)
Hi Pharmacy. I saw your below note. Would you hold his Xarelto for 1 tooth extraction and 2 root tip extractions?  Thank you!

## 2019-07-30 NOTE — Telephone Encounter (Signed)
Called requesting office. They stated pt will have one tooth extracted and two root tips extracted as well.

## 2019-07-30 NOTE — Telephone Encounter (Signed)
Pre-op covering staff, can you please contact dentist office and find out how many teeth patient is going to have extracted?  Thank you!

## 2019-07-31 NOTE — Telephone Encounter (Signed)
Hold Plavix 5 days before and resume after.

## 2019-07-31 NOTE — Telephone Encounter (Signed)
Hi Dr. Fletcher Anon,  Marvin Salinas is scheduled to have upcoming dental procedure (1 tooth extracted and 2 root tips extracted) and is being asked to hold Plavix. I know for simple dental extractions, we typically do not recommend holding antiplatelet. However, did not know if that applied to root tip extractions as well. You follow him for PAD and he underwent successful angioplasty and balloon expandable stent placement to the left common iliac artery on 05/30/2019. Is it OK for Plavix to be held this close to intervention?  Please route response back to P CV DIV PREOP.  Thank you! Loreley Schwall

## 2019-08-01 NOTE — Telephone Encounter (Signed)
   Primary Cardiologist: Mertie Moores, MD / Kathlyn Sacramento, MD  Chart reviewed as part of pre-operative protocol coverage. Simple dental extractions are considered low risk procedures per guidelines and generally do not require any specific cardiac clearance. It is also generally accepted that for simple extractions and dental cleanings, there is no need to interrupt blood thinner therapy. However, since patient is also having 2 root tip extractions, OK to hold Plavix for 5 days prior to procedure and Xarelto for 1 day prior to procedure. Both should be restarted as soon as able following procedure.   SBE prophylaxis is not required for the patient.  I will route this recommendation to the requesting party via Epic fax function and remove from pre-op pool.  Please call with questions.  Darreld Mclean, PA-C 08/01/2019, 7:44 AM

## 2019-08-20 ENCOUNTER — Ambulatory Visit (INDEPENDENT_AMBULATORY_CARE_PROVIDER_SITE_OTHER): Payer: Medicare Other | Admitting: Family Medicine

## 2019-08-20 ENCOUNTER — Encounter: Payer: Self-pay | Admitting: Family Medicine

## 2019-08-20 ENCOUNTER — Other Ambulatory Visit: Payer: Self-pay

## 2019-08-20 VITALS — BP 112/63 | HR 50 | Temp 97.6°F | Resp 17 | Ht 68.0 in | Wt 185.2 lb

## 2019-08-20 DIAGNOSIS — Z794 Long term (current) use of insulin: Secondary | ICD-10-CM

## 2019-08-20 DIAGNOSIS — E1149 Type 2 diabetes mellitus with other diabetic neurological complication: Secondary | ICD-10-CM

## 2019-08-20 DIAGNOSIS — E785 Hyperlipidemia, unspecified: Secondary | ICD-10-CM

## 2019-08-20 DIAGNOSIS — E1122 Type 2 diabetes mellitus with diabetic chronic kidney disease: Secondary | ICD-10-CM | POA: Diagnosis not present

## 2019-08-20 DIAGNOSIS — I472 Ventricular tachycardia: Secondary | ICD-10-CM

## 2019-08-20 DIAGNOSIS — N183 Chronic kidney disease, stage 3 unspecified: Secondary | ICD-10-CM | POA: Diagnosis not present

## 2019-08-20 DIAGNOSIS — I4892 Unspecified atrial flutter: Secondary | ICD-10-CM

## 2019-08-20 DIAGNOSIS — I4729 Other ventricular tachycardia: Secondary | ICD-10-CM

## 2019-08-20 DIAGNOSIS — I4891 Unspecified atrial fibrillation: Secondary | ICD-10-CM | POA: Diagnosis not present

## 2019-08-20 DIAGNOSIS — Z8673 Personal history of transient ischemic attack (TIA), and cerebral infarction without residual deficits: Secondary | ICD-10-CM

## 2019-08-20 DIAGNOSIS — I10 Essential (primary) hypertension: Secondary | ICD-10-CM

## 2019-08-20 DIAGNOSIS — N1831 Chronic kidney disease, stage 3a: Secondary | ICD-10-CM | POA: Diagnosis not present

## 2019-08-20 DIAGNOSIS — Z951 Presence of aortocoronary bypass graft: Secondary | ICD-10-CM

## 2019-08-20 DIAGNOSIS — I25810 Atherosclerosis of coronary artery bypass graft(s) without angina pectoris: Secondary | ICD-10-CM

## 2019-08-20 DIAGNOSIS — I779 Disorder of arteries and arterioles, unspecified: Secondary | ICD-10-CM | POA: Diagnosis not present

## 2019-08-20 DIAGNOSIS — I739 Peripheral vascular disease, unspecified: Secondary | ICD-10-CM

## 2019-08-20 LAB — POCT GLYCOSYLATED HEMOGLOBIN (HGB A1C)
HbA1c POC (<> result, manual entry): 10.8 % (ref 4.0–5.6)
HbA1c, POC (controlled diabetic range): 10.8 % — AB (ref 0.0–7.0)
HbA1c, POC (prediabetic range): 10.8 % — AB (ref 5.7–6.4)
Hemoglobin A1C: 10.8 % — AB (ref 4.0–5.6)

## 2019-08-20 MED ORDER — INSULIN GLARGINE 100 UNIT/ML ~~LOC~~ SOLN: 50.0000 [IU] | Freq: Two times a day (BID) | SUBCUTANEOUS | 5 refills | Status: DC

## 2019-08-20 MED ORDER — ROSUVASTATIN CALCIUM 20 MG PO TABS: 20.0000 mg | ORAL_TABLET | Freq: Every day | ORAL | 3 refills | Status: DC

## 2019-08-20 MED ORDER — EZETIMIBE 10 MG PO TABS: 10.0000 mg | ORAL_TABLET | Freq: Every day | ORAL | 3 refills | Status: DC

## 2019-08-20 MED ORDER — OLMESARTAN MEDOXOMIL 20 MG PO TABS: 20.0000 mg | ORAL_TABLET | Freq: Every day | ORAL | 1 refills | Status: DC

## 2019-08-20 MED ORDER — GLIPIZIDE 5 MG PO TABS: ORAL_TABLET | ORAL | 1 refills | Status: DC

## 2019-08-20 NOTE — Patient Instructions (Addendum)
COVID-19 Vaccine Information can be found at: [email or web address] For questions related to vaccine distribution or appointments, please email [email or web address] or call [phone].  Covid Vaccine appointment go to [email or web address].  Lantus: increase to 50 units at night and 45 units in the day. For 3 days... then increase to 50 units every 12 hours.  If sugars still above 110 routinely after above changes>> increase your glipizide to 2 tabs in the morning an 1 tab in the evening.

## 2019-08-20 NOTE — Progress Notes (Signed)
This visit occurred during the SARS-CoV-2 public health emergency.  Safety protocols were in place, including screening questions prior to the visit, additional usage of staff PPE, and extensive cleaning of exam room while observing appropriate contact time as indicated for disinfecting solutions.    Patient ID: Marvin Salinas, male  DOB: 1946/06/14, 74 y.o.   MRN: [id number] Patient Care Team    Relationship Specialty Notifications Start End  Ma Hillock, DO PCP - General Family Medicine  01/24/19   [name], Wonda Cheng, MD PCP - Cardiology Cardiology Admissions 04/03/19   Chesley Mires, MD Consulting Physician Pulmonary Disease  01/26/19   Laurena Slimmer, MD Referring Physician Nephrology  01/26/19   Constance Haw, MD Consulting Physician Cardiology  01/26/19   Calvert Cantor, MD Consulting Physician Ophthalmology  01/26/19   Garvin Fila, MD Consulting Physician Neurology  01/26/19   Lennon Alstrom, MD Consulting Physician Neurology  01/26/19     Chief Complaint  Patient presents with  . Diabetes    Fasting. Pt checks sugars at home. Needs refills.   . Hypertension    Subjective:  Marvin Salinas is a 74 y.o.  male present for diabetes follow up Type 2 diabetes mellitus with stage 3 chronic kidney disease, with long-term current use of insulin (HCC) Pt reports complaince with Lantus 45 units twice daily and glipizide 5 mg bid. Patient denies dizziness, hyperglycemic or hypoglycemic events. Patient denies numbness, tingling in the extremities or nonhealing wounds of feet.   Pt reports his fasting blood glucose ranges since increase in lantus 45 BID have been 160 FBG. Has been on gabapentin in the past- he reports his leg became swollen.  PNA series: Pneumonia series completed Flu shot: completed  (recommneded yearly) Foot exam: Referred to podiatry 12/2018 Eye exam: 03/2019, Dr. Bing Plume A1c: 8.9>>> 13.7 (08/22/2018)>>11 (01/24/2019)>>a1c ordered today  Atrial fibrillation with RVR  (HCC)/Essential hypertension/Ischemic heart disease/ S/P CABG x 3/Paroxysmal atrial flutter (HCC)/PAD (peripheral artery disease) (HCC)/Nonsustained ventricular tachycardia (HCC)/HLD/NSTEMI/h/o stroke/CKD 3 Patient has a history of severe 3-vessel coronary artery disease with chronic total occlusions of the proximal/mid LAD, mid/distal left circumflex/and proximal RCA.  Moderate caliber D1 and distal RPDA demonstrate 70-80% stenosis.  Moderately elevated left ventricular filling pressure. Pt reports compliance with compliance with  Crestor, Xarelto, Benicar 20, metoprolol 12.5 mg twice daily, Zetia, tikosyn and Plavix. Blood pressures ranges at home WNL. Patient denies chest pain, shortness of breath, dizziness or lower extremity edema.    Plavix and Xarelto. Pt is prescribed statin. At one point he was prescribed tricor, not on current list today. BMP: 01/22/2019 reviewed in care everywhere tab creatinine 1.55, GFR 44.  Baseline creatinine 1.55-1.8 and baseline GFR 36-44. CBC: 08/22/2018 CBC normal 08/22/2018 lipids with elevated triglycerides. Diet: Heart healthy-low-sodium diet encouraged RF: Hypertension, hyperlipidemia, smoker, STEMI, stroke, diabetes, family history, CAD, CVD, PAD, bilateral carotid artery stenosis   ECHO 10/12/2017: Study Conclusions - Left ventricle: The cavity size was normal. Wall thickness was   increased in a pattern of mild LVH. Systolic function was normal.   The estimated ejection fraction was in the range of 55% to 60%.   Wall motion was normal; there were no regional wall motion   abnormalities. Left ventricular diastolic function parameters   were normal. Impressions: - Normal LV systolic function; mild diastolic dysfunction; mild   LVH.  Vas US Carotid Result Date: 03/17/2018 Interpretation:  Right Carotid: Velocities in the right ICA are consistent with a 1-39% stenosis.  Non-hemodynamically significant plaque <50% noted in the CCA. Left Carotid:  Velocities in the left ICA are consistent with a 1-39% stenosis.               Non-hemodynamically significant plaque noted in the CCA. The ECA               appears >50% stenosed. Vertebrals:   Bilateral vertebral arteries demonstrate antegrade flow.  Subclavians: Left subclavian artery flow was disturbed. Normal flow hemodynamics              were seen in the right subclavian artery.  Suggest follow up study in 12 months due to excessive plaque   Carotid duplex 09/2013: 40-59% bilateral carotid stenosis  Depression screen [facility] 2/9 01/24/2019  Decreased Interest 0  Down, Depressed, Hopeless 0  PHQ - 2 Score 0  Altered sleeping 0  Tired, decreased energy 0  Change in appetite 0  Feeling bad or failure about yourself  0  Trouble concentrating 0  Moving slowly or fidgety/restless 0  Suicidal thoughts 0  PHQ-9 Score 0  Difficult doing work/chores Not difficult at all   No flowsheet data found.      Fall Risk  01/24/2019  Falls in the past year? 0  Number falls in past yr: 0  Injury with Fall? 0    Immunization History  Administered Date(s) Administered  . Fluad Quad(high Dose 65+) 02/12/2019  . Influenza Split 05/08/2013, 03/28/2017  . Influenza, High Dose Seasonal PF 05/08/2013, 03/28/2017, 05/02/2018  . Influenza,inj,Quad PF,6+ Mos 04/21/2016  . Pneumococcal Conjugate-13 07/13/2016  . Pneumococcal Polysaccharide-23 02/21/2018  . Zoster 06/22/2015    No exam data present  Past Medical History:  Diagnosis Date  . Acute kidney injury superimposed on chronic kidney disease ([place]) 10/12/2017  . Atrial fibrillation with RVR ([place]) 10/27/2017  . Back pain   . Basilar artery stenosis    on chronic Plavix  . Benign prostatic hypertrophy without urinary obstruction 04/17/2014  . Carotid artery disease ([place]) 10/06/2010   Carotid US 04/2019: Bilat ICA 40-59; L subclavian stenosis >> repeat 1 year  . Cerebral vascular disease    with prior TIA's; followed by Dr. Erling Cruz  . Depression,  neurotic 11/21/2013  . Diabetes mellitus    on insulin  . Enthesopathy of ankle and tarsus 09/04/2007   Overview:  Metatarsalgia   . History of renal calculi   . Hyperlipidemia   . Hypertension   . Ischemic heart disease    prior PCI to RCA in 1989. S/P PCI to LAD and OM in 1992. S/P PCI to first DX in 2000. S/P CABG x 3 in May 2011  . OSA (obstructive sleep apnea) 01/11/2018   AHI 18.1 and SaO2 low 73%  . PAD (peripheral artery disease) ([place]) 11/18/2017   ABIs/Arterial US 04/2019: R 1.21; L 0.66 // R SFA 30-49, stable > 50 CIA and EIA stenosis; L > 50 CIA stenosis (likely represents severe stenosis or short segment occlusion)  . Peripheral neuropathy   . Pneumonia Sept 2012  . Stroke ([place]) 04/16/2001   small right cerebellar infarct on 04/16/2001 at that time he was found to have proximal left vertebral artery, proximal left common carotid artery and both external carotid artery stenosis as well as intracranial stenosis involving mid basilar artery- 07/2011 add questionable TIA   Allergies  Allergen Reactions  . Codeine Nausea And Vomiting  . Lisinopril Cough  . Nsaids     CKD  . Latex Hives  Past Surgical History:  Procedure Laterality Date  . ABDOMINAL AORTOGRAM W/LOWER EXTREMITY Bilateral 05/30/2019   Procedure: ABDOMINAL AORTOGRAM W/LOWER EXTREMITY;  Surgeon: Wellington Hampshire, MD;  Location: [facility];  Service: Cardiovascular;  Laterality: Bilateral;  . ANGIOPLASTY  1989   right coronary artery  . ANGIOPLASTY  1992   LAD and OM  . ANGIOPLASTY  1998   First DX  . BRAIN SURGERY     on prior records  . CORONARY ARTERY BYPASS GRAFT  11/12/2009   LIMA to LAD, SVG to OM and SVG to RCA  . CORONARY STENT PLACEMENT  2000   Stent to LAD/Circumflex with angioplasty to first diagonal   . LEFT HEART CATH AND CORS/GRAFTS ANGIOGRAPHY N/A 10/13/2017   Procedure: LEFT HEART CATH AND CORS/GRAFTS ANGIOGRAPHY;  Surgeon: Nelva Bush, MD;  Location: [facility];  Service:  Cardiovascular;  Laterality: N/A;   Family History  Problem Relation Age of Onset  . Depression Mother   . Early death Mother   . Kidney disease Mother   . Ovarian cancer Mother   . Hypertension Father   . Heart disease Father   . Heart attack Father   . Asthma Brother   . Diabetes Brother   . Stroke Other        Uncle  . Diabetes Sister    Social History   Social History Narrative   Marital status/children/pets: divorced.   Education/employment: retired, Patient has his GED. 9th grade education.    Safety:      -smoke alarm in the home:Yes     - wears seatbelt: Yes   Patient is right handed.   Patient does not drink any caffeine.    Allergies as of 08/20/2019      Reactions   Codeine Nausea And Vomiting   Lisinopril Cough   Nsaids    CKD   Latex Hives      Medication List       Accurate as of August 20, 2019  8:39 AM. If you have any questions, ask your nurse or doctor.        STOP taking these medications   acetaminophen 325 MG tablet Commonly known as: TYLENOL Stopped by: Howard Pouch, DO   gabapentin 100 MG capsule Commonly known as: NEURONTIN Stopped by: Howard Pouch, DO     TAKE these medications   BD Insulin Syringe U/F 31G X 5/16" 0.5 ML Misc Generic drug: Insulin Syringe-Needle U-100 Inject 0.45 mLs of insulin (45 Units total) into the skin 2 (two) times daily. DX E11.22   clopidogrel 75 MG tablet Commonly known as: PLAVIX TAKE 1 TABLET BY MOUTH EVERY DAY   dofetilide 125 MCG capsule Commonly known as: TIKOSYN TAKE 1 CAPSULE BY MOUTH TWICE A DAY   ezetimibe 10 MG tablet Commonly known as: ZETIA Take 1 tablet (10 mg total) by mouth daily.   Fish Oil 1000 MG Caps Take by mouth.   GENTEAL OP Place 1 drop into both eyes daily as needed (dry eyes).   glipiZIDE 5 MG tablet Commonly known as: GLUCOTROL 2 tabs before breakfast and 1 tab with dinner. What changed:   how much to take  how to take this  when to take this  additional  instructions Changed by: Howard Pouch, DO   insulin glargine 100 UNIT/ML injection Commonly known as: LANTUS Inject 0.5 mLs (50 Units total) into the skin 2 (two) times daily. What changed: how much to take Changed by: Howard Pouch,  DO   loratadine 10 MG tablet Commonly known as: CLARITIN Take 10 mg by mouth daily as needed for allergies.   metoprolol tartrate 25 MG tablet Commonly known as: LOPRESSOR TAKE 0.5 TABLETS (12.5 MG TOTAL) BY MOUTH 2 (TWO) TIMES DAILY.   nitroGLYCERIN 0.4 MG SL tablet Commonly known as: NITROSTAT PLACE 1 TABLET (0.4 MG TOTAL) UNDER THE TONGUE EVERY 5 (FIVE) MINUTES AS NEEDED FOR CHEST PAIN.   olmesartan 20 MG tablet Commonly known as: BENICAR Take 1 tablet (20 mg total) by mouth daily.   OneTouch Delica Plus 0000000 Misc ONETOUCH VERIO LANCETS USE TO TEST YOUR BLOOD SUGAR TWICE A DAY FINGER STICK   OneTouch Verio test strip Generic drug: glucose blood USE WITH METER TWICE PER DAY DX DM TYPE 2   rivaroxaban 20 MG Tabs tablet Commonly known as: XARELTO Take 1 tablet (20 mg total) by mouth daily with supper.   rosuvastatin 20 MG tablet Commonly known as: CRESTOR Take 1 tablet (20 mg total) by mouth at bedtime.       All past medical history, surgical history, allergies, family history, immunizations andmedications were updated in the EMR today and reviewed under the history and medication portions of their EMR.      ROS: 14 pt review of systems performed and negative (unless mentioned in an HPI)  Objective: BP 112/63 (BP Location: Left Arm, Patient Position: Sitting, Cuff Size: Normal)   Pulse (!) 50   Temp 97.6 F (36.4 C) (Temporal)   Resp 17   Ht 5\' 8"  (1.727 m)   Wt 185 lb 4 oz (84 kg)   SpO2 99%   BMI 28.17 kg/m  Gen: Afebrile. No acute distress. Nontoxic. Very pleasant caucasian male.  HENT: AT. [place].  Eyes:Pupils Equal Round Reactive to light, Extraocular movements intact,  Conjunctiva without redness, discharge or  icterus. Neck/lymp/endocrine: Supple,no lymphadenopathy, no thyromegaly CV: RRR no murmur, no edema, +2/4 P posterior tibialis pulses Chest: CTAB, no wheeze or crackles Abd: Soft. NTND. BS present. no Masses palpated.  Skin: no rashes, purpura or petechiae.  Neuro:  Normal gait. PERLA. EOMi. Alert. Oriented x3  Psych: Normal affect, dress and demeanor. Normal speech. Normal thought content and judgment.   Assessment/plan: Marvin Salinas is a 74 y.o. male present for est care Type 2 diabetes mellitus with stage 3 chronic kidney disease, with long-term current use of insulin (HCC) - not well controlled despite increase in insulin last appt. He is not getting his needed exercise bc of pandemic. He has not signed up for the wait list for covid vaccine yet. Information provided to him today.  - Need more exercise.  - increase  Lantus 50 mg twice daily.  (taper instructions provided) - Given his cardiac history and his CKD oral options are limited.   - continue  glipizide 5 mg twice daily before meals today. If after tapering lantus up again his sugars are still above 110>> increase to 10/5 dosing.  - Encouraged him to try gabapentin 200 mg nightly . - Medicines tried: Lantus and Ozempic (side effects) - He declined nutrition referral .  States he has been doing a lot of research online and feels he is doing all right.  He just does not have the motivation to change his diet as he should but he is working on it. - Ambulatory referral to Podiatry placed July 2020 PNA series: Pneumonia series completed Flu shot: completed  (recommneded yearly) Foot exam: Referred to podiatry 12/2018 Eye exam: 03/2019, Dr.  Digby A1c: 8.9>>> 13.7 (08/22/2018)>>11 (01/24/2019)>>9.3>10>10.8 Follow-up every 3-4 months  Atrial fibrillation with RVR (HCC)/Essential hypertension/Ischemic heart disease/ S/P CABG x 3/Paroxysmal atrial flutter (HCC)/PAD (peripheral artery disease) (HCC)/Nonsustained ventricular tachycardia  (HCC)/HLD/NSTEMI/h/o stroke/PAD - stable. - continue Crestor 20 mg daily and Zetia 10 mg.  Prescription provided by cardiology. - continue Xarelto and Plavix.  Prescription provided by cardiology. - continue Benicar 20 mg daily - continue metoprolol 12.5 mg twice daily and tikosyn prescribed by cardiology. -Encouraged him to stop smoking. -Continue routine follow-ups with cardiology  Chronic kidney disease (CKD), stage III (moderate) (HCC) Renally dose meds. Avoid NSAIDs. PTH/calcium/vitamin D every 6 months.     Return in about 4 months (around 12/20/2019).  Orders Placed This Encounter  Procedures  . POCT glycosylated hemoglobin (Hb A1C)   Meds ordered this encounter  Medications  . olmesartan (BENICAR) 20 MG tablet    Sig: Take 1 tablet (20 mg total) by mouth daily.    Dispense:  90 tablet    Refill:  1  . rosuvastatin (CRESTOR) 20 MG tablet    Sig: Take 1 tablet (20 mg total) by mouth at bedtime.    Dispense:  90 tablet    Refill:  3  . ezetimibe (ZETIA) 10 MG tablet    Sig: Take 1 tablet (10 mg total) by mouth daily.    Dispense:  90 tablet    Refill:  3  . insulin glargine (LANTUS) 100 UNIT/ML injection    Sig: Inject 0.5 mLs (50 Units total) into the skin 2 (two) times daily.    Dispense:  30 mL    Refill:  5    DC ALL other lantus scripts please. Dose change. thank you.  Marland Kitchen glipiZIDE (GLUCOTROL) 5 MG tablet    Sig: 2 tabs before breakfast and 1 tab with dinner.    Dispense:  270 tablet    Refill:  1   Referral Orders  No referral(s) requested today     Note is dictated utilizing voice recognition software. Although note has been proof read prior to signing, occasional typographical errors still can be missed. If any questions arise, please do not hesitate to call for verification.  Electronically signed by: Howard Pouch, DO [place]

## 2019-09-14 ENCOUNTER — Other Ambulatory Visit: Payer: Self-pay | Admitting: Cardiovascular Disease

## 2019-10-01 ENCOUNTER — Encounter: Payer: Self-pay | Admitting: Cardiovascular Disease

## 2019-10-01 ENCOUNTER — Ambulatory Visit: Payer: Medicare Other | Admitting: Cardiovascular Disease

## 2019-10-01 ENCOUNTER — Other Ambulatory Visit: Payer: Self-pay

## 2019-10-01 VITALS — BP 114/66 | HR 59 | Ht 68.0 in | Wt 183.0 lb

## 2019-10-01 DIAGNOSIS — I4819 Other persistent atrial fibrillation: Secondary | ICD-10-CM | POA: Diagnosis not present

## 2019-10-01 DIAGNOSIS — E118 Type 2 diabetes mellitus with unspecified complications: Secondary | ICD-10-CM | POA: Diagnosis not present

## 2019-10-01 DIAGNOSIS — I2510 Atherosclerotic heart disease of native coronary artery without angina pectoris: Secondary | ICD-10-CM

## 2019-10-01 DIAGNOSIS — Z79899 Other long term (current) drug therapy: Secondary | ICD-10-CM

## 2019-10-01 DIAGNOSIS — I10 Essential (primary) hypertension: Secondary | ICD-10-CM | POA: Diagnosis not present

## 2019-10-01 DIAGNOSIS — I6523 Occlusion and stenosis of bilateral carotid arteries: Secondary | ICD-10-CM | POA: Diagnosis not present

## 2019-10-01 DIAGNOSIS — Z794 Long term (current) use of insulin: Secondary | ICD-10-CM

## 2019-10-01 DIAGNOSIS — I4892 Unspecified atrial flutter: Secondary | ICD-10-CM

## 2019-10-01 DIAGNOSIS — I739 Peripheral vascular disease, unspecified: Secondary | ICD-10-CM

## 2019-10-01 LAB — BASIC METABOLIC PANEL
BUN/Creatinine Ratio: 12 (ref 10–24)
BUN: 20 mg/dL (ref 8–27)
CO2: 19 mmol/L — ABNORMAL LOW (ref 20–29)
Calcium: 8.8 mg/dL (ref 8.6–10.2)
Chloride: 103 mmol/L (ref 96–106)
Creatinine, Ser: 1.65 mg/dL — ABNORMAL HIGH (ref 0.76–1.27)
GFR calc Af Amer: 47 mL/min/{1.73_m2} — ABNORMAL LOW (ref >59)
GFR calc non Af Amer: 40 mL/min/{1.73_m2} — ABNORMAL LOW (ref >59)
Glucose: 374 mg/dL — ABNORMAL HIGH (ref 65–99)
Potassium: 5.2 mmol/L (ref 3.5–5.2)
Sodium: 136 mmol/L (ref 134–144)

## 2019-10-01 LAB — HEPATIC FUNCTION PANEL
ALT: 16 IU/L (ref 0–44)
AST: 17 IU/L (ref 0–40)
Albumin: 4.2 g/dL (ref 3.7–4.7)
Alkaline Phosphatase: 63 IU/L (ref 39–117)
Bilirubin Total: 0.2 mg/dL (ref 0.0–1.2)
Bilirubin, Direct: 0.1 mg/dL (ref 0.00–0.40)
Total Protein: 6 g/dL (ref 6.0–8.5)

## 2019-10-01 LAB — LIPID PANEL
Chol/HDL Ratio: 2.6 ratio (ref 0.0–5.0)
Cholesterol, Total: 110 mg/dL (ref 100–199)
HDL: 42 mg/dL (ref >39)
LDL Chol Calc (NIH): 36 mg/dL (ref 0–99)
Triglycerides: 204 mg/dL — ABNORMAL HIGH (ref 0–149)
VLDL Cholesterol Cal: 32 mg/dL (ref 5–40)

## 2019-10-01 LAB — MAGNESIUM: Magnesium: 2 mg/dL (ref 1.6–2.3)

## 2019-10-01 NOTE — Progress Notes (Signed)
Marvin Salinas Date of Birth  1946/03/13       [place] [id number], [place], [place]  [address]   [place], [place]  [address] [phone]     [phone]   Fax  [phone]    Fax [phone]  Problem List: 1. Coronary artery disease-status post CABG-2001 2. Hypertension 3. Hyperlipidemia 4. Diabetes mellitus 5. Extensive cerebrovascular disease with prior TIAs and known basilar artery stenosis    Marvin Salinas is seen back today for a 3 month check.  He is a former patient of Dr. Susa Simmonds. He has known CAD with prior CABG back in 2011. Other issues include HTN, HLD, DM and extensive cerebral vascular disease with prior TIA's and known basilar artery stenosis. He is on chronic Plavix and aspirin therapy. Was on coumadin in the remote past.  Pt has done well. Complains of back pain.  He gets some regular exercise - walks on occasion.  September 08, 2015: Marvin Salinas is seen back after a 3-4 year absence. Former patient of [facility].  CAD - CABG in 2011,   Cerebral vascular disease   Doing well,  No CP , no further TIAs  HR is slow,  No syncope or presyncope.   HR has been slow for a while  Exercises,  Still paints houses.   Oct 20, 2016:  Marvin Salinas is doing well from a cardiac standpont. No CP or dyspnea Has leg cramp / burning with exercise Is on pravachol for hyperlipidemia,  Has not tolerated the other statins   Sept. 26, 2019:  Marvin Salinas has done well.  He was admitted to the hospital in April, 2019 with a non-ST segment elevation myocardial infarction.  He also had developed atrial flutter with rapid rates.  He spontaneously converted to sinus rhythm.  He was started on Tikosyn at that time.  Heart catheterization revealed patent grafts.  Has had rare episodes of chest pressure - thinks it was indigestion. Processes on a regular basis.  He walks regularly.  He also has a history of hyperlipidemia and diabetes  mellitus.  October 01, 2019:  Marvin Salinas is seen today for follow-up of his coronary artery disease, hyperlipidemia, atrial fibrillation and peripheral arterial disease.   Dr. Fletcher Anon performed successful angioplasty and stent placement to the left common iliac artery.  He is been doing well with minimal claudication since that time. Still having knee pain and hip pain .   No CP  Still smokes - wants to quit.      Current Outpatient Medications on File Prior to Visit  Medication Sig Dispense Refill  . BD INSULIN SYRINGE U/F 31G X 5/16" 0.5 ML MISC Inject 0.45 mLs of insulin (45 Units total) into the skin 2 (two) times daily. DX E11.22 180 each 1  . Carboxymethylcell-Hypromellose (GENTEAL OP) Place 1 drop into both eyes daily as needed (dry eyes).    . clopidogrel (PLAVIX) 75 MG tablet TAKE 1 TABLET BY MOUTH EVERY DAY (Patient taking differently: Take 75 mg by mouth daily. ) 90 tablet 3  . dofetilide (TIKOSYN) 125 MCG capsule TAKE 1 CAPSULE BY MOUTH TWICE A DAY (Patient taking differently: Take 125 mcg by mouth 2 (two) times daily. ) 180 capsule 1  . ezetimibe (ZETIA) 10 MG tablet Take 1 tablet (10 mg total) by mouth daily. 90 tablet 3  . glipiZIDE (GLUCOTROL) 5 MG tablet 2 tabs before breakfast and 1 tab with dinner.  270 tablet 1  . Lancets (ONETOUCH DELICA PLUS 123XX123) MISC ONETOUCH VERIO LANCETS USE TO TEST YOUR BLOOD SUGAR TWICE A DAY FINGER STICK    . loratadine (CLARITIN) 10 MG tablet Take 10 mg by mouth daily as needed for allergies.    . metoprolol tartrate (LOPRESSOR) 25 MG tablet TAKE 0.5 TABLETS (12.5 MG TOTAL) BY MOUTH 2 (TWO) TIMES DAILY. 90 tablet 2  . nitroGLYCERIN (NITROSTAT) 0.4 MG SL tablet PLACE 1 TABLET (0.4 MG TOTAL) UNDER THE TONGUE EVERY 5 (FIVE) MINUTES AS NEEDED FOR CHEST PAIN. 25 tablet 12  . olmesartan (BENICAR) 20 MG tablet Take 1 tablet (20 mg total) by mouth daily. 90 tablet 1  . Omega-3 Fatty Acids (FISH OIL) 1000 MG CAPS Take by mouth.    Glory Rosebush VERIO test strip  USE WITH METER TWICE PER DAY DX DM TYPE 2    . rivaroxaban (XARELTO) 20 MG TABS tablet Take 1 tablet (20 mg total) by mouth daily with supper. 90 tablet 1  . rosuvastatin (CRESTOR) 20 MG tablet Take 1 tablet (20 mg total) by mouth at bedtime. 90 tablet 3  . insulin glargine (LANTUS) 100 UNIT/ML injection Inject 0.5 mLs (50 Units total) into the skin 2 (two) times daily. 30 mL 5   No current facility-administered medications on file prior to visit.     Allergies  Allergen Reactions  . Codeine Nausea And Vomiting  . Lisinopril Cough  . Nsaids     CKD  . Latex Hives    Past Medical History:  Diagnosis Date  . Acute kidney injury superimposed on chronic kidney disease ([place]) 10/12/2017  . Atrial fibrillation with RVR ([facility]) 10/27/2017  . Back pain   . Basilar artery stenosis    on chronic Plavix  . Benign prostatic hypertrophy without urinary obstruction 04/17/2014  . Carotid artery disease ([facility]) 10/06/2010   Carotid US 04/2019: Bilat ICA 40-59; L subclavian stenosis >> repeat 1 year  . Cerebral vascular disease    with prior TIA's; followed by Dr. Erling Cruz  . Depression, neurotic 11/21/2013  . Diabetes mellitus    on insulin  . Enthesopathy of ankle and tarsus 09/04/2007   Overview:  Metatarsalgia   . History of renal calculi   . Hyperlipidemia   . Hypertension   . Ischemic heart disease    prior PCI to RCA in 1989. S/P PCI to LAD and OM in 1992. S/P PCI to first DX in 2000. S/P CABG x 3 in May 2011  . OSA (obstructive sleep apnea) 01/11/2018   AHI 18.1 and SaO2 low 73%  . PAD (peripheral artery disease) ([facility]) 11/18/2017   ABIs/Arterial US 04/2019: R 1.21; L 0.66 // R SFA 30-49, stable > 50 CIA and EIA stenosis; L > 50 CIA stenosis (likely represents severe stenosis or short segment occlusion)  . Peripheral neuropathy   . Pneumonia Sept 2012  . Stroke ([facility]) 04/16/2001   small right cerebellar infarct on 04/16/2001 at that time he was found to have proximal left vertebral artery,  proximal left common carotid artery and both external carotid artery stenosis as well as intracranial stenosis involving mid basilar artery- 07/2011 add questionable TIA    Past Surgical History:  Procedure Laterality Date  . ABDOMINAL AORTOGRAM W/LOWER EXTREMITY Bilateral 05/30/2019   Procedure: ABDOMINAL AORTOGRAM W/LOWER EXTREMITY;  Surgeon: Wellington Hampshire, MD;  Location: [facility];  Service: Cardiovascular;  Laterality: Bilateral;  . ANGIOPLASTY  1989   right coronary artery  . ANGIOPLASTY  1992   LAD and OM  . ANGIOPLASTY  1998   First DX  . BRAIN SURGERY     on prior records  . CORONARY ARTERY BYPASS GRAFT  11/12/2009   LIMA to LAD, SVG to OM and SVG to RCA  . CORONARY STENT PLACEMENT  2000   Stent to LAD/Circumflex with angioplasty to first diagonal   . LEFT HEART CATH AND CORS/GRAFTS ANGIOGRAPHY N/A 10/13/2017   Procedure: LEFT HEART CATH AND CORS/GRAFTS ANGIOGRAPHY;  Surgeon: Nelva Bush, MD;  Location: [facility];  Service: Cardiovascular;  Laterality: N/A;    Social History   Tobacco Use  Smoking Status Current Every Day Smoker  . Packs/day: 1.00  . Years: 54.00  . Pack years: 54.00  . Types: Cigarettes  . Last attempt to quit: 10/19/2009  . Years since quitting: 9.9  Smokeless Tobacco Never Used    Social History   Substance and Sexual Activity  Alcohol Use Yes    Family History  Problem Relation Age of Onset  . Depression Mother   . Early death Mother   . Kidney disease Mother   . Ovarian cancer Mother   . Hypertension Father   . Heart disease Father   . Heart attack Father   . Asthma Brother   . Diabetes Brother   . Stroke Other        Uncle  . Diabetes Sister     Reviw of Systems:  Reviewed in the HPI.  All other systems are negative.  Physical Exam: Blood pressure 114/66, pulse (!) 59, height 5\' 8"  (1.727 m), weight 183 lb (83 kg), SpO2 99 %.  GEN:  Elderly male,  NAD  HEENT: Normal NECK: No JVD; No carotid bruits  LYMPHATICS: No lymphadenopathy CARDIAC: RRR , no murmurs, rubs, gallops RESPIRATORY:  Clear to auscultation without rales, wheezing or rhonchi  ABDOMEN: Soft, non-tender, non-distended MUSCULOSKELETAL:  No edema; No deformity  SKIN: Warm and dry NEUROLOGIC:  Alert and oriented x 3   ECG:   Sinus bradycardia at 57 beats a minute.  QTC is 449 ms.  No changes.   Assessment / Plan:   1. Coronary artery disease  -he denies having any episodes of angina.  I encouraged him to stop smoking.  2.  Atrial fib:   He is currently on Tikosyn and Xarelto.  He is currently in sinus rhythm.  .   2. Hypertension -    blood pressures well controlled.   3. Hyperlipidemia -continue rosuvastatin.  Will check lipids, liver enzymes today.    4. Diabetes mellitus -glucose levels have been elevated.  He needs to discuss this with his primary medical doctor. 5.  Carotid artery disease:   Stable.     Mertie Moores, MD  10/01/2019 9:21 AM    [address],  [address] [place], [place]  [address] Pager [phone] Phone: [phone]; Fax: [phone]

## 2019-10-01 NOTE — Patient Instructions (Signed)
Medication Instructions:  Your physician recommends that you continue on your current medications as directed. Please refer to the Current Medication list given to you today.  *If you need a refill on your cardiac medications before your next appointment, please call your pharmacy*   Lab Work: TODAY - Magnesium, BMET, Lipid, Liver If you have labs (blood work) drawn today and your tests are completely normal, you will receive your results only by: Marland Kitchen MyChart Message (if you have MyChart) OR . A paper copy in the mail If you have any lab test that is abnormal or we need to change your treatment, we will call you to review the results.   Testing/Procedures: None Ordered   Follow-Up: At [facility], you and your health needs are our priority.  As part of our continuing mission to provide you with exceptional heart care, we have created designated Provider Care Teams.  These Care Teams include your primary Cardiologist (physician) and Advanced Practice Providers (APPs -  Physician Assistants and Nurse Practitioners) who all work together to provide you with the care you need, when you need it.  We recommend signing up for the patient portal called "MyChart".  Sign up information is provided on this After Visit Summary.  MyChart is used to connect with patients for Virtual Visits (Telemedicine).  Patients are able to view lab/test results, encounter notes, upcoming appointments, etc.  Non-urgent messages can be sent to your provider as well.   To learn more about what you can do with MyChart, go to [email or web address].    Your next appointment:   6 month(s)  The format for your next appointment:   In Person  Provider:   Richardson Dopp, PA-C

## 2019-10-03 NOTE — Addendum Note (Signed)
Addended by: Mendel Ryder on: 10/03/2019 08:06 AM   Modules accepted: Orders

## 2019-10-11 DIAGNOSIS — N1831 Chronic kidney disease, stage 3a: Secondary | ICD-10-CM | POA: Diagnosis not present

## 2019-10-18 ENCOUNTER — Other Ambulatory Visit: Payer: Self-pay | Admitting: Cardiovascular Disease

## 2019-10-20 ENCOUNTER — Other Ambulatory Visit: Payer: Self-pay | Admitting: Physician Assistant

## 2019-10-29 ENCOUNTER — Other Ambulatory Visit: Payer: Self-pay

## 2019-10-29 ENCOUNTER — Encounter: Payer: Self-pay | Admitting: Family Medicine

## 2019-10-29 ENCOUNTER — Ambulatory Visit (INDEPENDENT_AMBULATORY_CARE_PROVIDER_SITE_OTHER): Payer: Medicare Other | Admitting: Family Medicine

## 2019-10-29 VITALS — BP 113/62 | HR 55 | Temp 97.1°F | Resp 16 | Ht 68.0 in | Wt 182.2 lb

## 2019-10-29 DIAGNOSIS — N183 Chronic kidney disease, stage 3 unspecified: Secondary | ICD-10-CM | POA: Diagnosis not present

## 2019-10-29 DIAGNOSIS — Z794 Long term (current) use of insulin: Secondary | ICD-10-CM | POA: Diagnosis not present

## 2019-10-29 DIAGNOSIS — E1122 Type 2 diabetes mellitus with diabetic chronic kidney disease: Secondary | ICD-10-CM

## 2019-10-29 LAB — GLUCOSE, POCT (MANUAL RESULT ENTRY): POC Glucose: 120 mg/dl — AB (ref 70–99)

## 2019-10-29 NOTE — Patient Instructions (Signed)
If sugars > 200 after meal you can take glipizide 10 mg (2 tabs) twice a day 30 minutes before meal.  We will keep the insulin dose the same since it looks so good now.   You can cut crestor in half before bed. Hopefully that will help your energy and joint discomfort but keep enough in your system to provide some cardiovascular protection.   See you at your routine diabetes appt in a few weeks.

## 2019-10-29 NOTE — Progress Notes (Signed)
This visit occurred during the SARS-CoV-2 public health emergency.  Safety protocols were in place, including screening questions prior to the visit, additional usage of staff PPE, and extensive cleaning of exam room while observing appropriate contact time as indicated for disinfecting solutions.    Patient ID: Marvin Salinas, male  DOB: 04-27-1946, 74 y.o.   MRN: [id number] Patient Care Team    Relationship Specialty Notifications Start End  Ma Hillock, DO PCP - General Family Medicine  01/24/19   [name], Wonda Cheng, MD PCP - Cardiology Cardiology Admissions 04/03/19   Chesley Mires, MD Consulting Physician Pulmonary Disease  01/26/19   Laurena Slimmer, MD Referring Physician Nephrology  01/26/19   Constance Haw, MD Consulting Physician Cardiology  01/26/19   Calvert Cantor, MD Consulting Physician Ophthalmology  01/26/19   Garvin Fila, MD Consulting Physician Neurology  01/26/19   Lennon Alstrom, MD Consulting Physician Neurology  01/26/19     Chief Complaint  Patient presents with  . Diabetes    Pt has had high blood sugars reading. Thursday it was up to 500.     Subjective: Marvin Salinas is a 74 y.o.  male present for elevated sugars Type 2 diabetes mellitus with stage 3 chronic kidney disease, with long-term current use of insulin (HCC) Pt reports compliance with Lantus 50 units twice daily and glipizide 10/5 mg bid. Patient reports he had and event last Thursday where he felt weak and off. He called EMS and reports his EKG was normal. His sugar was elevated > 500. He denies being ill, fever or chills. He feels his joints ache and prevents him from exercising as much. He believes it is from his crestor. He reports his sugars normalized over the weekend and this morning FBG was 93.  He has had some elevations after a meal up in the 200s.  PNA series: Pneumonia series completed Flu shot: completed  (recommneded yearly) Foot exam: Referred to podiatry 12/2018 Eye exam: 03/2019, Dr.  Bing Plume A1c: 8.9>>> 13.7 (08/22/2018)>>11 (01/24/2019)>>10.8 08/20/2019  Depression screen PHQ 2/9 01/24/2019  Decreased Interest 0  Down, Depressed, Hopeless 0  PHQ - 2 Score 0  Altered sleeping 0  Tired, decreased energy 0  Change in appetite 0  Feeling bad or failure about yourself  0  Trouble concentrating 0  Moving slowly or fidgety/restless 0  Suicidal thoughts 0  PHQ-9 Score 0  Difficult doing work/chores Not difficult at all   No flowsheet data found.      Fall Risk  01/24/2019  Falls in the past year? 0  Number falls in past yr: 0  Injury with Fall? 0    Immunization History  Administered Date(s) Administered  . Fluad Quad(high Dose 65+) 02/12/2019  . Influenza Split 05/08/2013, 03/28/2017  . Influenza, High Dose Seasonal PF 05/08/2013, 03/28/2017, 05/02/2018  . Influenza,inj,Quad PF,6+ Mos 04/21/2016  . Moderna SARS-COVID-2 Vaccination 08/27/2019, 10/03/2019  . Pneumococcal Conjugate-13 07/13/2016  . Pneumococcal Polysaccharide-23 02/21/2018  . Zoster 06/22/2015    No exam data present  Past Medical History:  Diagnosis Date  . Acute kidney injury superimposed on chronic kidney disease ([place]) 10/12/2017  . Atrial fibrillation with RVR ([place]) 10/27/2017  . Back pain   . Basilar artery stenosis    on chronic Plavix  . Benign prostatic hypertrophy without urinary obstruction 04/17/2014  . Carotid artery disease ([place]) 10/06/2010   Carotid US 04/2019: Bilat ICA 40-59; L subclavian stenosis >> repeat 1 year  . Cerebral vascular  disease    with prior TIA's; followed by Dr. Erling Cruz  . Depression, neurotic 11/21/2013  . Diabetes mellitus    on insulin  . Enthesopathy of ankle and tarsus 09/04/2007   Overview:  Metatarsalgia   . History of renal calculi   . Hyperlipidemia   . Hypertension   . Ischemic heart disease    prior PCI to RCA in 1989. S/P PCI to LAD and OM in 1992. S/P PCI to first DX in 2000. S/P CABG x 3 in May 2011  . OSA (obstructive sleep apnea) 01/11/2018   AHI  18.1 and SaO2 low 73%  . PAD (peripheral artery disease) ([facility]) 11/18/2017   ABIs/Arterial US 04/2019: R 1.21; L 0.66 // R SFA 30-49, stable > 50 CIA and EIA stenosis; L > 50 CIA stenosis (likely represents severe stenosis or short segment occlusion)  . Peripheral neuropathy   . Pneumonia Sept 2012  . Stroke ([facility]) 04/16/2001   small right cerebellar infarct on 04/16/2001 at that time he was found to have proximal left vertebral artery, proximal left common carotid artery and both external carotid artery stenosis as well as intracranial stenosis involving mid basilar artery- 07/2011 add questionable TIA   Allergies  Allergen Reactions  . Codeine Nausea And Vomiting  . Lisinopril Cough  . Nsaids     CKD  . Latex Hives   Past Surgical History:  Procedure Laterality Date  . ABDOMINAL AORTOGRAM W/LOWER EXTREMITY Bilateral 05/30/2019   Procedure: ABDOMINAL AORTOGRAM W/LOWER EXTREMITY;  Surgeon: Wellington Hampshire, MD;  Location: [facility];  Service: Cardiovascular;  Laterality: Bilateral;  . ANGIOPLASTY  1989   right coronary artery  . ANGIOPLASTY  1992   LAD and OM  . ANGIOPLASTY  1998   First DX  . BRAIN SURGERY     on prior records  . CORONARY ARTERY BYPASS GRAFT  11/12/2009   LIMA to LAD, SVG to OM and SVG to RCA  . CORONARY STENT PLACEMENT  2000   Stent to LAD/Circumflex with angioplasty to first diagonal   . LEFT HEART CATH AND CORS/GRAFTS ANGIOGRAPHY N/A 10/13/2017   Procedure: LEFT HEART CATH AND CORS/GRAFTS ANGIOGRAPHY;  Surgeon: Nelva Bush, MD;  Location: [facility];  Service: Cardiovascular;  Laterality: N/A;   Family History  Problem Relation Age of Onset  . Depression Mother   . Early death Mother   . Kidney disease Mother   . Ovarian cancer Mother   . Hypertension Father   . Heart disease Father   . Heart attack Father   . Asthma Brother   . Diabetes Brother   . Stroke Other        Uncle  . Diabetes Sister    Social History   Social History  Narrative   Marital status/children/pets: divorced.   Education/employment: retired, Patient has his GED. 9th grade education.    Safety:      -smoke alarm in the home:Yes     - wears seatbelt: Yes   Patient is right handed.   Patient does not drink any caffeine.    Allergies as of 10/29/2019      Reactions   Codeine Nausea And Vomiting   Lisinopril Cough   Nsaids    CKD   Latex Hives      Medication List       Accurate as of Oct 29, 2019  9:32 AM. If you have any questions, ask your nurse or doctor.  BD Insulin Syringe U/F 31G X 5/16" 0.5 ML Misc Generic drug: Insulin Syringe-Needle U-100 Inject 0.45 mLs of insulin (45 Units total) into the skin 2 (two) times daily. DX E11.22   clopidogrel 75 MG tablet Commonly known as: PLAVIX TAKE 1 TABLET BY MOUTH EVERY DAY   dofetilide 125 MCG capsule Commonly known as: TIKOSYN TAKE 1 CAPSULE BY MOUTH TWICE A DAY   ezetimibe 10 MG tablet Commonly known as: ZETIA Take 1 tablet (10 mg total) by mouth daily.   Fish Oil 1000 MG Caps Take by mouth.   GENTEAL OP Place 1 drop into both eyes daily as needed (dry eyes).   glipiZIDE 5 MG tablet Commonly known as: GLUCOTROL 2 tabs before breakfast and 1 tab with dinner.   insulin glargine 100 UNIT/ML injection Commonly known as: LANTUS Inject 0.5 mLs (50 Units total) into the skin 2 (two) times daily.   loratadine 10 MG tablet Commonly known as: CLARITIN Take 10 mg by mouth daily as needed for allergies.   metoprolol tartrate 25 MG tablet Commonly known as: LOPRESSOR TAKE 0.5 TABLETS (12.5 MG TOTAL) BY MOUTH 2 (TWO) TIMES DAILY.   nitroGLYCERIN 0.4 MG SL tablet Commonly known as: NITROSTAT PLACE 1 TABLET (0.4 MG TOTAL) UNDER THE TONGUE EVERY 5 (FIVE) MINUTES AS NEEDED FOR CHEST PAIN.   olmesartan 20 MG tablet Commonly known as: BENICAR Take 1 tablet (20 mg total) by mouth daily.   OneTouch Delica Plus 0000000 Misc ONETOUCH VERIO LANCETS USE TO TEST YOUR BLOOD  SUGAR TWICE A DAY FINGER STICK   OneTouch Verio test strip Generic drug: glucose blood USE WITH METER TWICE PER DAY DX DM TYPE 2   rivaroxaban 20 MG Tabs tablet Commonly known as: XARELTO Take 1 tablet (20 mg total) by mouth daily with supper.   rosuvastatin 20 MG tablet Commonly known as: CRESTOR Take 1 tablet (20 mg total) by mouth at bedtime.       All past medical history, surgical history, allergies, family history, immunizations andmedications were updated in the EMR today and reviewed under the history and medication portions of their EMR.      ROS: 14 pt review of systems performed and negative (unless mentioned in an HPI)  Objective: BP 113/62 (BP Location: Right Arm, Patient Position: Sitting, Cuff Size: Normal)   Pulse (!) 55   Temp (!) 97.1 F (36.2 C) (Temporal)   Resp 16   Ht 5\' 8"  (1.727 m)   Wt 182 lb 4 oz (82.7 kg)   SpO2 100%   BMI 27.71 kg/m  Gen: Afebrile. No acute distress. nontoxic HENT: AT. [place].   Neck/lymp/endocrine: Supple,no lymphadenopathy, no thyromegaly CV: RRR no murmur, trace edema, +2/4 P posterior tibialis pulses Chest: CTAB, no wheeze or crackles Abd: Soft. NTND. BS present. no Masses palpated.  Skin: no rashes, purpura or petechiae.  Neuro:  Normal gait. PERLA. EOMi. Alert. Oriented x3  Psych: Normal affect, dress and demeanor. Normal speech. Normal thought content and judgment.   Assessment/plan: AKILI ANDUJO is a 74 y.o. male present for est care Type 2 diabetes mellitus with stage 3 chronic kidney disease, with long-term current use of insulin (HCC) - suspect spike in sugars possibly from diet. They have normalized since.  - would not increase insulin at this time with FBG 93 this morning. Current CBG 120 (not fasting) - Need more exercise.  -  Continue  Lantus 50 mg twice daily - Given his cardiac history and his CKD oral options are  limited.   - continue  glipizide 10/5 mg twice daily before meals today. If after meal sugars  still > 200> increase to glip 10/10. He reports understanding.  - Encouraged him to try gabapentin 200 mg nightly . - Medicines tried: Lantus and Ozempic (side effects) - He declined nutrition referral .  States he has been doing a lot of research online and feels he is doing all right.  He just does not have the motivation to change his diet as he should but he is working on it. - Ambulatory referral to Podiatry placed July 2020 PNA series: Pneumonia series completed Flu shot: completed  (recommneded yearly) Foot exam: Referred to podiatry 12/2018 Eye exam: 03/2019, Dr. Bing Plume A1c: 8.9>>> 13.7 (08/22/2018)>>11 (01/24/2019)>>9.3>10>10.8 Follow-up in 4 weeks at his routine DM appt.      Return in about 4 weeks (around 11/26/2019).  Orders Placed This Encounter  Procedures  . POCT glucose (manual entry)   No orders of the defined types were placed in this encounter.  Referral Orders  No referral(s) requested today     Note is dictated utilizing voice recognition software. Although note has been proof read prior to signing, occasional typographical errors still can be missed. If any questions arise, please do not hesitate to call for verification.  Electronically signed by: Howard Pouch, DO [place]

## 2019-11-08 LAB — HEMOGLOBIN A1C: Hemoglobin A1C: 9.1

## 2019-11-13 ENCOUNTER — Telehealth: Payer: Self-pay

## 2019-11-26 ENCOUNTER — Encounter: Payer: Self-pay | Admitting: Family Medicine

## 2019-11-26 ENCOUNTER — Ambulatory Visit (INDEPENDENT_AMBULATORY_CARE_PROVIDER_SITE_OTHER): Payer: Medicare Other | Admitting: Family Medicine

## 2019-11-26 ENCOUNTER — Other Ambulatory Visit: Payer: Self-pay | Admitting: *Deleted

## 2019-11-26 ENCOUNTER — Other Ambulatory Visit: Payer: Self-pay

## 2019-11-26 VITALS — BP 117/72 | HR 61 | Temp 98.0°F | Resp 17 | Ht 68.0 in | Wt 183.1 lb

## 2019-11-26 DIAGNOSIS — N1831 Chronic kidney disease, stage 3a: Secondary | ICD-10-CM

## 2019-11-26 DIAGNOSIS — Z951 Presence of aortocoronary bypass graft: Secondary | ICD-10-CM | POA: Diagnosis not present

## 2019-11-26 DIAGNOSIS — I252 Old myocardial infarction: Secondary | ICD-10-CM

## 2019-11-26 DIAGNOSIS — I779 Disorder of arteries and arterioles, unspecified: Secondary | ICD-10-CM

## 2019-11-26 DIAGNOSIS — E1149 Type 2 diabetes mellitus with other diabetic neurological complication: Secondary | ICD-10-CM

## 2019-11-26 DIAGNOSIS — E785 Hyperlipidemia, unspecified: Secondary | ICD-10-CM

## 2019-11-26 DIAGNOSIS — E1122 Type 2 diabetes mellitus with diabetic chronic kidney disease: Secondary | ICD-10-CM | POA: Diagnosis not present

## 2019-11-26 DIAGNOSIS — I4891 Unspecified atrial fibrillation: Secondary | ICD-10-CM

## 2019-11-26 DIAGNOSIS — I10 Essential (primary) hypertension: Secondary | ICD-10-CM | POA: Diagnosis not present

## 2019-11-26 DIAGNOSIS — Z794 Long term (current) use of insulin: Secondary | ICD-10-CM

## 2019-11-26 DIAGNOSIS — Z8673 Personal history of transient ischemic attack (TIA), and cerebral infarction without residual deficits: Secondary | ICD-10-CM

## 2019-11-26 DIAGNOSIS — N183 Chronic kidney disease, stage 3 unspecified: Secondary | ICD-10-CM | POA: Diagnosis not present

## 2019-11-26 DIAGNOSIS — J342 Deviated nasal septum: Secondary | ICD-10-CM | POA: Diagnosis not present

## 2019-11-26 DIAGNOSIS — I25119 Atherosclerotic heart disease of native coronary artery with unspecified angina pectoris: Secondary | ICD-10-CM

## 2019-11-26 LAB — POCT GLYCOSYLATED HEMOGLOBIN (HGB A1C)
HbA1c POC (<> result, manual entry): 10.5 % (ref 4.0–5.6)
HbA1c, POC (controlled diabetic range): 10.5 % — AB (ref 0.0–7.0)
HbA1c, POC (prediabetic range): 10.5 % — AB (ref 5.7–6.4)
Hemoglobin A1C: 10.5 % — AB (ref 4.0–5.6)

## 2019-11-26 NOTE — Patient Instructions (Signed)
Please try to follow a diabetic diet.  Next appt in 3 mos. We will collect labs You will get a call from [place] in the next couple days about adding a med once we see if your insurance will cover invokana or like med.  I will refer you back to your sinus doctor.   Keep legs elevated. Wear compression stockings.      Diabetes Mellitus and Nutrition, Adult When you have diabetes (diabetes mellitus), it is very important to have healthy eating habits because your blood sugar (glucose) levels are greatly affected by what you eat and drink. Eating healthy foods in the appropriate amounts, at about the same times every day, can help you:  Control your blood glucose.  Lower your risk of heart disease.  Improve your blood pressure.  Reach or maintain a healthy weight. Every person with diabetes is different, and each person has different needs for a meal plan. Your health care provider may recommend that you work with a diet and nutrition specialist (dietitian) to make a meal plan that is best for you. Your meal plan may vary depending on factors such as:  The calories you need.  The medicines you take.  Your weight.  Your blood glucose, blood pressure, and cholesterol levels.  Your activity level.  Other health conditions you have, such as heart or kidney disease. How do carbohydrates affect me? Carbohydrates, also called carbs, affect your blood glucose level more than any other type of food. Eating carbs naturally raises the amount of glucose in your blood. Carb counting is a method for keeping track of how many carbs you eat. Counting carbs is important to keep your blood glucose at a healthy level, especially if you use insulin or take certain oral diabetes medicines. It is important to know how many carbs you can safely have in each meal. This is different for every person. Your dietitian can help you calculate how many carbs you should have at each meal and for each snack. Foods that  contain carbs include:  Bread, cereal, rice, pasta, and crackers.  Potatoes and corn.  Peas, beans, and lentils.  Milk and yogurt.  Fruit and juice.  Desserts, such as cakes, cookies, ice cream, and candy. How does alcohol affect me? Alcohol can cause a sudden decrease in blood glucose (hypoglycemia), especially if you use insulin or take certain oral diabetes medicines. Hypoglycemia can be a life-threatening condition. Symptoms of hypoglycemia (sleepiness, dizziness, and confusion) are similar to symptoms of having too much alcohol. If your health care provider says that alcohol is safe for you, follow these guidelines:  Limit alcohol intake to no more than 1 drink per day for nonpregnant women and 2 drinks per day for men. One drink equals 12 oz of beer, 5 oz of wine, or 1 oz of hard liquor.  Do not drink on an empty stomach.  Keep yourself hydrated with water, diet soda, or unsweetened iced tea.  Keep in mind that regular soda, juice, and other mixers may contain a lot of sugar and must be counted as carbs. What are tips for following this plan?  Reading food labels  Start by checking the serving size on the "Nutrition Facts" label of packaged foods and drinks. The amount of calories, carbs, fats, and other nutrients listed on the label is based on one serving of the item. Many items contain more than one serving per package.  Check the total grams (g) of carbs in one serving. You can  calculate the number of servings of carbs in one serving by dividing the total carbs by 15. For example, if a food has 30 g of total carbs, it would be equal to 2 servings of carbs.  Check the number of grams (g) of saturated and trans fats in one serving. Choose foods that have low or no amount of these fats.  Check the number of milligrams (mg) of salt (sodium) in one serving. Most people should limit total sodium intake to less than 2,300 mg per day.  Always check the nutrition information of  foods labeled as "low-fat" or "nonfat". These foods may be higher in added sugar or refined carbs and should be avoided.  Talk to your dietitian to identify your daily goals for nutrients listed on the label. Shopping  Avoid buying canned, premade, or processed foods. These foods tend to be high in fat, sodium, and added sugar.  Shop around the outside edge of the grocery store. This includes fresh fruits and vegetables, bulk grains, fresh meats, and fresh dairy. Cooking  Use low-heat cooking methods, such as baking, instead of high-heat cooking methods like deep frying.  Cook using healthy oils, such as olive, canola, or sunflower oil.  Avoid cooking with butter, cream, or high-fat meats. Meal planning  Eat meals and snacks regularly, preferably at the same times every day. Avoid going long periods of time without eating.  Eat foods high in fiber, such as fresh fruits, vegetables, beans, and whole grains. Talk to your dietitian about how many servings of carbs you can eat at each meal.  Eat 4-6 ounces (oz) of lean protein each day, such as lean meat, chicken, fish, eggs, or tofu. One oz of lean protein is equal to: ? 1 oz of meat, chicken, or fish. ? 1 egg. ?  cup of tofu.  Eat some foods each day that contain healthy fats, such as avocado, nuts, seeds, and fish. Lifestyle  Check your blood glucose regularly.  Exercise regularly as told by your health care provider. This may include: ? 150 minutes of moderate-intensity or vigorous-intensity exercise each week. This could be brisk walking, biking, or water aerobics. ? Stretching and doing strength exercises, such as yoga or weightlifting, at least 2 times a week.  Take medicines as told by your health care provider.  Do not use any products that contain nicotine or tobacco, such as cigarettes and e-cigarettes. If you need help quitting, ask your health care provider.  Work with a Social worker or diabetes educator to identify  strategies to manage stress and any emotional and social challenges. Questions to ask a health care provider  Do I need to meet with a diabetes educator?  Do I need to meet with a dietitian?  What number can I call if I have questions?  When are the best times to check my blood glucose? Where to find more information:  American Diabetes Association: diabetes.org  Academy of Nutrition and Dietetics: www.eatright.[employer] of Diabetes and Digestive and Kidney Diseases (NIH): [employer] Summary  A healthy meal plan will help you control your blood glucose and maintain a healthy lifestyle.  Working with a diet and nutrition specialist (dietitian) can help you make a meal plan that is best for you.  Keep in mind that carbohydrates (carbs) and alcohol have immediate effects on your blood glucose levels. It is important to count carbs and to use alcohol carefully. This information is not intended to replace advice given to you by your  health care provider. Make sure you discuss any questions you have with your health care provider. Document Revised: 05/20/2017 Document Reviewed: 07/12/2016 Elsevier Patient Education  2020 [employer].

## 2019-11-26 NOTE — Progress Notes (Signed)
This visit occurred during the SARS-CoV-2 public health emergency.  Safety protocols were in place, including screening questions prior to the visit, additional usage of staff PPE, and extensive cleaning of exam room while observing appropriate contact time as indicated for disinfecting solutions.    Patient ID: Marvin Salinas, male  DOB: May 17, 1946, 74 y.o.   MRN: [id number] Patient Care Team    Relationship Specialty Notifications Start End  Ma Hillock, DO PCP - General Family Medicine  01/24/19   [name], Wonda Cheng, MD PCP - Cardiology Cardiology Admissions 04/03/19   Chesley Mires, MD Consulting Physician Pulmonary Disease  01/26/19   Laurena Slimmer, MD Referring Physician Nephrology  01/26/19   Constance Haw, MD Consulting Physician Cardiology  01/26/19   Calvert Cantor, MD Consulting Physician Ophthalmology  01/26/19   Garvin Fila, MD Consulting Physician Neurology  01/26/19   Love, Alyson Locket, MD Consulting Physician Neurology  01/26/19   Leona Singleton, RN [facility]  Admissions 11/13/19     Chief Complaint  Patient presents with  . Diabetes    Pt states sugar readings have been better     Subjective: Marvin Salinas is a 74 y.o.  male present for elevated sugars Type 2 diabetes mellitus with stage 3 chronic kidney disease, with long-term current use of insulin (HCC) Pt reports compliance with Lantus 50 units twice daily and glipizide 10/10 mg bid.  He has had some elevations after a meal up in the 280.  He reports his fasting blood glucose is approximately 115-130. PNA series: Pneumonia series completed Flu shot: completed  (recommneded yearly) Foot exam: Referred to podiatry 12/2018 Eye exam: 03/2019, Dr. Bing Plume A1c: 8.9>>> 13.7 (08/22/2018)>>11 (01/24/2019)>>10.8 08/20/2019> 10.5 today  Atrial fibrillation with RVR (HCC)/Essential hypertension/Ischemic heart disease/ S/P CABG x 3/Paroxysmal atrial flutter (HCC)/PAD (peripheral artery disease)  (HCC)/Nonsustained ventricular tachycardia (HCC)/HLD/NSTEMI/h/o stroke/CKD 3 Patient has a history of severe 3-vessel coronary artery disease with chronic total occlusions of the proximal/mid LAD, mid/distal left circumflex/and proximal RCA.  Moderate caliber D1 and distal RPDA demonstrate 70-80% stenosis.  Moderately elevated left ventricular filling pressure. Pt reports compliance with  Crestor, Xarelto, Benicar 20, metoprolol 12.5 mg twice daily, Zetia, tikosyn and Plavix. Blood pressures ranges at home WNL. Patient denies chest pain, shortness of breath, dizziness or lower extremity edema.  Pt is prescribed statin. Diet: Heart healthy-low-sodium diet encouraged RF: Hypertension, hyperlipidemia, smoker, STEMI, stroke, diabetes, family history, CAD, CVD, PAD, bilateral carotid artery stenosis  ECHO 10/12/2017: Study Conclusions - Left ventricle: The cavity size was normal. Wall thickness was increased in a pattern of mild LVH. Systolic function was normal. The estimated ejection fraction was in the range of 55% to 60%. Wall motion was normal; there were no regional wall motion abnormalities. Left ventricular diastolic function parameters were normal. Impressions: - Normal LV systolic function; mild diastolic dysfunction; mild LVH.  Vas US Carotid 04/25/2019: Carotid ultrasound shows some slight progression in bilateral carotid artery plaque. It was in the 1-39% range last year and now in the 40-59% range.  Result Date: 03/17/2018 Interpretation:  Right Carotid: Velocities in the right ICA are consistent with a 1-39% stenosis.                Non-hemodynamically significant plaque <50% noted in the CCA. Left Carotid: Velocities in the left ICA are consistent with a 1-39% stenosis.               Non-hemodynamically significant plaque noted  in the CCA. The ECA               appears >50% stenosed. Vertebrals:   Bilateral vertebral arteries demonstrate antegrade flow.  Subclavians:  Left subclavian artery flow was disturbed. Normal flow hemodynamics              were seen in the right subclavian artery.  Suggest follow up study in 12 months due to excessive plaque     Depression screen [facility] 2/9 01/24/2019  Decreased Interest 0  Down, Depressed, Hopeless 0  PHQ - 2 Score 0  Altered sleeping 0  Tired, decreased energy 0  Change in appetite 0  Feeling bad or failure about yourself  0  Trouble concentrating 0  Moving slowly or fidgety/restless 0  Suicidal thoughts 0  PHQ-9 Score 0  Difficult doing work/chores Not difficult at all   No flowsheet data found.      Fall Risk  01/24/2019  Falls in the past year? 0  Number falls in past yr: 0  Injury with Fall? 0    Immunization History  Administered Date(s) Administered  . Fluad Quad(high Dose 65+) 02/12/2019  . Influenza Split 05/08/2013, 03/28/2017  . Influenza, High Dose Seasonal PF 05/08/2013, 03/28/2017, 05/02/2018  . Influenza,inj,Quad PF,6+ Mos 04/21/2016  . Moderna SARS-COVID-2 Vaccination 08/27/2019, 10/03/2019  . Pneumococcal Conjugate-13 07/13/2016  . Pneumococcal Polysaccharide-23 02/21/2018  . Zoster 06/22/2015    No exam data present  Past Medical History:  Diagnosis Date  . Acute kidney injury superimposed on chronic kidney disease ([place]) 10/12/2017  . Atrial fibrillation with RVR ([place]) 10/27/2017  . Back pain   . Basilar artery stenosis    on chronic Plavix  . Benign prostatic hypertrophy without urinary obstruction 04/17/2014  . Carotid artery disease ([place]) 10/06/2010   Carotid US 04/2019: Bilat ICA 40-59; L subclavian stenosis >> repeat 1 year  . Cerebral vascular disease    with prior TIA's; followed by Dr. Erling Cruz  . Depression, neurotic 11/21/2013  . Diabetes mellitus    on insulin  . Enthesopathy of ankle and tarsus 09/04/2007   Overview:  Metatarsalgia   . History of renal calculi   . Hyperlipidemia   . Hypertension   . Ischemic heart disease    prior PCI to RCA in 1989. S/P PCI  to LAD and OM in 1992. S/P PCI to first DX in 2000. S/P CABG x 3 in May 2011  . OSA (obstructive sleep apnea) 01/11/2018   AHI 18.1 and SaO2 low 73%  . PAD (peripheral artery disease) ([place]) 11/18/2017   ABIs/Arterial US 04/2019: R 1.21; L 0.66 // R SFA 30-49, stable > 50 CIA and EIA stenosis; L > 50 CIA stenosis (likely represents severe stenosis or short segment occlusion)  . Peripheral neuropathy   . Pneumonia Sept 2012  . Stroke ([place]) 04/16/2001   small right cerebellar infarct on 04/16/2001 at that time he was found to have proximal left vertebral artery, proximal left common carotid artery and both external carotid artery stenosis as well as intracranial stenosis involving mid basilar artery- 07/2011 add questionable TIA   Allergies  Allergen Reactions  . Codeine Nausea And Vomiting  . Lisinopril Cough  . Nsaids     CKD  . Latex Hives   Past Surgical History:  Procedure Laterality Date  . ABDOMINAL AORTOGRAM W/LOWER EXTREMITY Bilateral 05/30/2019   Procedure: ABDOMINAL AORTOGRAM W/LOWER EXTREMITY;  Surgeon: Wellington Hampshire, MD;  Location: [facility];  Service: Cardiovascular;  Laterality: Bilateral;  . ANGIOPLASTY  1989   right coronary artery  . ANGIOPLASTY  1992   LAD and OM  . ANGIOPLASTY  1998   First DX  . BRAIN SURGERY     on prior records  . CORONARY ARTERY BYPASS GRAFT  11/12/2009   LIMA to LAD, SVG to OM and SVG to RCA  . CORONARY STENT PLACEMENT  2000   Stent to LAD/Circumflex with angioplasty to first diagonal   . LEFT HEART CATH AND CORS/GRAFTS ANGIOGRAPHY N/A 10/13/2017   Procedure: LEFT HEART CATH AND CORS/GRAFTS ANGIOGRAPHY;  Surgeon: Nelva Bush, MD;  Location: [facility];  Service: Cardiovascular;  Laterality: N/A;   Family History  Problem Relation Age of Onset  . Depression Mother   . Early death Mother   . Kidney disease Mother   . Ovarian cancer Mother   . Hypertension Father   . Heart disease Father   . Heart attack Father     . Asthma Brother   . Diabetes Brother   . Stroke Other        Uncle  . Diabetes Sister    Social History   Social History Narrative   Marital status/children/pets: divorced.   Education/employment: retired, Patient has his GED. 9th grade education.    Safety:      -smoke alarm in the home:Yes     - wears seatbelt: Yes   Patient is right handed.   Patient does not drink any caffeine.    Allergies as of 11/26/2019      Reactions   Codeine Nausea And Vomiting   Lisinopril Cough   Nsaids    CKD   Latex Hives      Medication List       Accurate as of November 26, 2019 11:59 PM. If you have any questions, ask your nurse or doctor.        BD Insulin Syringe U/F 31G X 5/16" 0.5 ML Misc Generic drug: Insulin Syringe-Needle U-100 Inject 0.52-0.55 mL into the skin 2 (two) times daily as directed. DX E11.22 What changed: additional instructions Changed by: Howard Pouch, DO   clopidogrel 75 MG tablet Commonly known as: PLAVIX TAKE 1 TABLET BY MOUTH EVERY DAY   dofetilide 125 MCG capsule Commonly known as: TIKOSYN TAKE 1 CAPSULE BY MOUTH TWICE A DAY   ezetimibe 10 MG tablet Commonly known as: ZETIA Take 1 tablet (10 mg total) by mouth daily.   Fish Oil 1000 MG Caps Take by mouth.   GENTEAL OP Place 1 drop into both eyes daily as needed (dry eyes).   glipiZIDE 10 MG tablet Commonly known as: GLUCOTROL Take 1 tablet (10 mg total) by mouth 2 (two) times daily before a meal. What changed:   medication strength  how much to take  how to take this  when to take this  additional instructions Changed by: Howard Pouch, DO   insulin glargine 100 UNIT/ML injection Commonly known as: LANTUS 52-55 units subq inj. BID What changed:   how much to take  how to take this  when to take this  additional instructions Changed by: Howard Pouch, DO   loratadine 10 MG tablet Commonly known as: CLARITIN Take 10 mg by mouth daily as needed for allergies.   metoprolol  tartrate 25 MG tablet Commonly known as: LOPRESSOR TAKE 0.5 TABLETS (12.5 MG TOTAL) BY MOUTH 2 (TWO) TIMES DAILY.   nitroGLYCERIN 0.4 MG SL tablet Commonly known as: NITROSTAT PLACE 1 TABLET (0.4  MG TOTAL) UNDER THE TONGUE EVERY 5 (FIVE) MINUTES AS NEEDED FOR CHEST PAIN.   olmesartan 20 MG tablet Commonly known as: BENICAR Take 1 tablet (20 mg total) by mouth daily.   OneTouch Delica Plus [id number] Misc ONETOUCH VERIO LANCETS USE TO TEST YOUR BLOOD SUGAR TWICE A DAY FINGER STICK   OneTouch Verio test strip Generic drug: glucose blood USE WITH METER TWICE PER DAY DX DM TYPE 2   rivaroxaban 20 MG Tabs tablet Commonly known as: XARELTO Take 1 tablet (20 mg total) by mouth daily with supper.   rosuvastatin 20 MG tablet Commonly known as: CRESTOR Take 1 tablet (20 mg total) by mouth at bedtime.       All past medical history, surgical history, allergies, family history, immunizations andmedications were updated in the EMR today and reviewed under the history and medication portions of their EMR.      ROS: 14 pt review of systems performed and negative (unless mentioned in an HPI)  Objective: BP 117/72 (BP Location: Right Arm, Patient Position: Sitting, Cuff Size: Normal)   Pulse 61   Temp 98 F (36.7 C) (Temporal)   Resp 17   Ht 5\' 8"  (1.727 m)   Wt 183 lb 2 oz (83.1 kg)   SpO2 98%   BMI 27.84 kg/m  Gen: Afebrile. No acute distress.  Nontoxic in appearance, well-developed, well-nourished, male. HENT: AT. [place].  No cough.  No shortness of breath. Eyes:Pupils Equal Round Reactive to light, Extraocular movements intact,  Conjunctiva without redness, discharge or icterus. Neck/lymp/endocrine: Supple, no lymphadenopathy, no thyromegaly CV: RRR , trace edema, +2/4 P posterior tibialis pulses Chest: CTAB, no wheeze or crackles Abd: Soft. NTND. BS present.  No masses palpated.  Skin: No rashes, purpura or petechiae.  Neuro:  Normal gait. PERLA. EOMi. Alert. Oriented x3  Psych:  Normal affect, dress and demeanor. Normal speech. Normal thought content and judgment.   Assessment/plan: Marvin Salinas is a 74 y.o. male present for est care Type 2 diabetes mellitus with stage 3 chronic kidney disease, with long-term current use of insulin ([place]) -Patient was encouraged to follow a diabetic diet and make an effort to get routine exercise.  He admits he does not follow a diabetic diet. -Increase Lantus to 52 units twice daily.  After 1 week , if after dinner sugar still remains greater than 200 he is to increase his morning Lantus dose to 55 units.(Max dose 55 units in the morning/52 units in the evening) - Given his cardiac history and his CKD oral options are limited.  The remaining oral medication would be Invokana 100 mg tab (renal dose), which is not covered by his insurance. -Discussed may need to try Victoza or Trulicity versus short acting insulin premeal. -Continue glipizide 10/10 mg twice daily before meals today. - Encouraged him to try gabapentin 200 mg nightly. - Medicines tried: Lantus and Ozempic (side effects) - He declined nutrition referral - Ambulatory referral to Podiatry placed July 2020 PNA series: Pneumonia series completed Flu shot: completed  (recommneded yearly) Foot exam: Referred to podiatry 12/2018 Eye exam: 03/2019, Dr. Bing Plume A1c: 8.9>>> 13.7 (08/22/2018)>>11 (01/24/2019)>>9.3>10>10.8> 10.5  today  Atrial fibrillation with RVR (HCC)/Essential hypertension/Ischemic heart disease/ S/P CABG x 3/Paroxysmal atrial flutter (HCC)/PAD (peripheral artery disease) (HCC)/Nonsustained ventricular tachycardia (HCC)/HLD/NSTEMI/h/o stroke/PAD - Blood pressure is stable.  He does have mild lower extremity edema.  Patient was encouraged to monitor his fluid levels.  Make dietary changes-low-sodium which he does not follow routinely.  If edema  is still present or is worsening, can consider starting low-dose Lasix as needed. - continue Crestor 20 mg daily and Zetia 10  mg.  Prescription provided by cardiology. - continue Xarelto and Plavix.  Prescription provided by cardiology. - Continue Benicar 20 mg daily - continue metoprolol 12.5 mg twice daily  - Continue tikosyn prescribed by cardiology. -Encouraged him to stop smoking. -Continue routine follow-ups with cardiology  Chronic kidney disease (CKD), stage III (moderate) (HCC) Renally dose meds. Avoid NSAIDs. PTH/calcium/vitamin D every 6 months  Nasal septum deviation patient also endorses desiring ENT referral.  He has had chronic sinusitis and nasal septum deviation.  He had seen an ENT in the past and would like referred back to them.   Return in about 3 months (around 02/29/2020) for [place] (30 min).  Orders Placed This Encounter  Procedures  . Ambulatory referral to ENT  . POCT glycosylated hemoglobin (Hb A1C)   Meds ordered this encounter  Medications  . insulin glargine (LANTUS) 100 UNIT/ML injection    Sig: 52-55 units subq inj. BID    Dispense:  30 mL    Refill:  5    DC ALL other lantus scripts please. Dose change. thank you.  . BD INSULIN SYRINGE U/F 31G X 5/16" 0.5 ML MISC    Sig: Inject 0.52-0.55 mL into the skin 2 (two) times daily as directed. DX E11.22    Dispense:  180 each    Refill:  1  . glipiZIDE (GLUCOTROL) 10 MG tablet    Sig: Take 1 tablet (10 mg total) by mouth 2 (two) times daily before a meal.    Dispense:  180 tablet    Refill:  1    Dc prior script dose change  . olmesartan (BENICAR) 20 MG tablet    Sig: Take 1 tablet (20 mg total) by mouth daily.    Dispense:  90 tablet    Refill:  1    Referral Orders     Ambulatory referral to ENT   Note is dictated utilizing voice recognition software. Although note has been proof read prior to signing, occasional typographical errors still can be missed. If any questions arise, please do not hesitate to call for verification.  Electronically signed by: Howard Pouch, DO [place]

## 2019-11-26 NOTE — Patient Outreach (Signed)
[facility]) Care Management  11/26/2019  Marvin Salinas 1945/07/16 [id number]   Telephone Screen  Referral Date:  11/13/2019 Referral Source:  EMMI Prevent Reason for Referral:  EMMI Prevent Screening/Join Care Management Insurance:  [employer] Medicare   Outreach Attempt:  Outreach attempt #1 to patient for telephone screening.  Patient answered and stated he was out running errands, requested call back another day.   Plan:  RN Health Coach will make another outreach attempt within the next 3-4 business days per patient request.     Hubert Azure RN [place] [phone] [email or web address]

## 2019-11-27 ENCOUNTER — Telehealth: Payer: Self-pay | Admitting: Family Medicine

## 2019-11-27 MED ORDER — "BD INSULIN SYRINGE U/F 31G X 5/16"" 0.5 ML MISC": 1 refills | Status: DC

## 2019-11-27 MED ORDER — INSULIN GLARGINE 100 UNIT/ML ~~LOC~~ SOLN: SUBCUTANEOUS | 5 refills | Status: DC

## 2019-11-27 MED ORDER — OLMESARTAN MEDOXOMIL 20 MG PO TABS: 20.0000 mg | ORAL_TABLET | Freq: Every day | ORAL | 1 refills | Status: DC

## 2019-11-27 MED ORDER — GLIPIZIDE 10 MG PO TABS: 10.0000 mg | ORAL_TABLET | Freq: Two times a day (BID) | ORAL | 1 refills | Status: DC

## 2019-11-27 NOTE — Telephone Encounter (Signed)
Please inform patient His insurance does not cover the oral medication we were discussing called Invokana. Since that is the last oral medication that can be prescribed for him considering his chronic conditions, I suggest we attempt slowly increasing his Lantus a little bit more before considering adding on another medication (which would have to be a different type of injectable).  A new prescription for his Lantus with the dose changes has been called in as well as his glipizide.   -Please make sure he checks his glipizide dose when he picks it up from the pharmacy.  I have prescribed the glipizide 10 mg tabs so he will take 1 tab twice daily (he did have the 5 mg tabs and was taking 2 tabs twice daily.  Therefore dose is  the same, pill load is decreased).      -As for his Lantus dose: He is to increase to 52 units of Lantus twice daily.     - After 1 week, if after dinner sugars are still routinely greater than 200> then he is to increase his morning Lantus dose to 55 units and keep his night dose at 52 units.  Please make sure he reads back and understand the changes above.

## 2019-11-28 ENCOUNTER — Other Ambulatory Visit: Payer: Self-pay | Admitting: *Deleted

## 2019-11-28 ENCOUNTER — Encounter: Payer: Self-pay | Admitting: Family Medicine

## 2019-11-28 NOTE — Telephone Encounter (Signed)
Pt was called and given lab results/instructions. He repeated back the instruction to me. He verbalized understanding and call with any concerns.

## 2019-11-28 NOTE — Patient Outreach (Signed)
[facility] [facility]) Care Management  11/28/2019  Marvin Salinas December 17, 1945 [id number]   Telephone Screen  Referral Date:  11/13/2019 Referral Source:  EMMI Prevent Reason for Referral:  EMMI Prevent Screening/Join Care Management Insurance:  [employer] Medicare   Outreach Attempt: Outreach attempt #2 to patient for screening.  Patient answered and stated he was driving, requested telephone call back.   Plan:  RN Health Coach will make another outreach attempt to patient within 3-4 business days if no return call back from patient.   [place] [phone] [email or web address]

## 2019-11-30 ENCOUNTER — Other Ambulatory Visit: Payer: Self-pay | Admitting: Cardiovascular Disease

## 2019-11-30 NOTE — Telephone Encounter (Signed)
Age 74, weight 83kg, SCr 1.65 on 10/01/19, CrCl 43mL/min. SCr previously stable 1.35 - will send in refill for 1 month and recheck renal function at upcoming appt on 7/6 with Dr Fletcher Anon to see if CrCl remains < 50 and dose reduction is needed.

## 2019-11-30 NOTE — Telephone Encounter (Signed)
Please review for refill. Thanks!  

## 2019-11-30 NOTE — Telephone Encounter (Signed)
Refill requested

## 2019-12-03 ENCOUNTER — Other Ambulatory Visit: Payer: Self-pay | Admitting: *Deleted

## 2019-12-03 NOTE — Patient Outreach (Signed)
[place] [facility]) Care Management  12/03/2019  Marvin Salinas [birth year] [id number]   Telephone Screen  Referral Date:11/13/2019 Referral Source:EMMI Prevent Reason for Referral:EMMI Prevent Screening/Join Care Management Insurance:[employer] Medicare   Outreach Attempt:  Successful telephone outreach to patient for telephone screening.  HIPAA verified with patient.  RN Health Coach introduced self and role.  [facility] services reviewed and discussed.  Patient verbally agrees to Disease Management outreaches.  Does state he is working with the [place] Pharmacist at primary care providers office for medication adjustments.  States he has a doctors appointment today to get ready for and unable to complete initial telephone assessment; request call back another day and time.  Plan:  RN Health Coach will send patient [facility] Welcome Packet.  RN Health Coach will make another outreach attempt to complete initial telephone assessment within the month of June, per patient's request.   Hubert Azure RN [place] [phone] [email or web address]

## 2019-12-06 ENCOUNTER — Encounter: Payer: Self-pay | Admitting: Family Medicine

## 2019-12-18 ENCOUNTER — Other Ambulatory Visit: Payer: Self-pay | Admitting: *Deleted

## 2019-12-18 NOTE — Patient Outreach (Signed)
[place] [facility]) Care Management  12/18/2019  Marvin Salinas May 23, 1946 [id number]   RN Health Coach Initial Assessment  Referral Date:  12/03/2019 Referral Source:  EMMI Prevent Screening Reason for Referral:  Disease Management Education Insurance:  [employer]   Outreach Attempt:  Outreach attempt #1 to patient for initial telephone assessment.  Patient answered and stated he was driving.  He request to return call to this [place] when he returned home.   Plan:  RN Health Coach will make another outreach attempt within the month of July if no return call back from patient.   [place] [phone] [email or web address]

## 2019-12-19 ENCOUNTER — Ambulatory Visit (HOSPITAL_BASED_OUTPATIENT_CLINIC_OR_DEPARTMENT_OTHER)
Admission: RE | Admit: 2019-12-19 | Discharge: 2019-12-19 | Disposition: A | Discharge status: Home / Self Care | Payer: Medicare Other | Source: Ambulatory Visit | Attending: Cardiovascular Disease | Admitting: Cardiovascular Disease

## 2019-12-19 ENCOUNTER — Ambulatory Visit (HOSPITAL_COMMUNITY)
Admission: RE | Admit: 2019-12-19 | Discharge: 2019-12-19 | Disposition: A | Discharge status: Home / Self Care | Payer: Medicare Other | Source: Ambulatory Visit | Attending: Cardiovascular Disease | Admitting: Cardiovascular Disease

## 2019-12-19 ENCOUNTER — Other Ambulatory Visit (HOSPITAL_COMMUNITY): Payer: Self-pay | Admitting: Cardiovascular Disease

## 2019-12-19 ENCOUNTER — Other Ambulatory Visit: Payer: Self-pay

## 2019-12-19 DIAGNOSIS — Z95828 Presence of other vascular implants and grafts: Secondary | ICD-10-CM | POA: Insufficient documentation

## 2019-12-19 DIAGNOSIS — I739 Peripheral vascular disease, unspecified: Secondary | ICD-10-CM

## 2019-12-24 ENCOUNTER — Other Ambulatory Visit: Payer: Self-pay | Admitting: Cardiovascular Disease

## 2019-12-24 NOTE — Progress Notes (Signed)
Cardiology Office Note   Date:  12/25/2019   ID:  Marvin Salinas, DOB 12/27/1945, MRN [id number]  PCP:  Ma Hillock, DO  Cardiologist:  Dr. Acie Fredrickson  No chief complaint on file.     History of Present Illness: Marvin Salinas is Salinas 74 y.o. male who is here today for Salinas follow-up visit regarding peripheral arterial disease.  He has known history of coronary artery disease status post CABG in 2001, paroxysmal atrial fibrillation on anticoagulation, essential hypertension, carotid disease, hyperlipidemia, diabetes and prior TIAs. Patient has known history of peripheral arterial disease .  He does have underlying chronic kidney disease with creatinine between 1.6-2.  The patient is followed for peripheral arterial disease with previous severe claudication.  Angiography in December 2020 showed heavily calcified subtotal occlusion of the left common iliac artery with moderate disease affecting the right common iliac artery.  I performed successful angioplasty and and balloon expandable stent placement to the left common iliac artery.   He has known history of low back pain and bilateral knee joint pain.   Recent noninvasive vascular studies showed noncompressible vessels.  Duplex showed significant stenosis affecting the distal right common iliac artery with peak velocity of 345.  Left common iliac stent was patent with peak velocity of 251 in the distal segment.  He has been having left hip, left knee and left ankle discomfort both at rest and with physical activities.  The symptoms are different from his prior claudication.  He is trying to quit smoking and is down to 4 cigarettes Salinas day.  Past Medical History:  Diagnosis Date  . Acute kidney injury superimposed on chronic kidney disease ([place]) 10/12/2017  . Atrial fibrillation with RVR ([place]) 10/27/2017  . Back pain   . Basilar artery stenosis    on chronic Plavix  . Benign prostatic hypertrophy without urinary obstruction 04/17/2014  .  Carotid artery disease ([place]) 10/06/2010   Carotid US 04/2019: Bilat ICA 40-59; L subclavian stenosis >> repeat 1 year  . Cerebral vascular disease    with prior TIA's; followed by Dr. Erling Cruz  . Depression, neurotic 11/21/2013  . Diabetes mellitus    on insulin  . Enthesopathy of ankle and tarsus 09/04/2007   Overview:  Metatarsalgia   . History of renal calculi   . Hyperlipidemia   . Hypertension   . Ischemic heart disease    prior PCI to RCA in 1989. S/P PCI to LAD and OM in 1992. S/P PCI to first DX in 2000. S/P CABG x 3 in May 2011  . OSA (obstructive sleep apnea) 01/11/2018   AHI 18.1 and SaO2 low 73%  . PAD (peripheral artery disease) ([place]) 11/18/2017   ABIs/Arterial US 04/2019: R 1.21; L 0.66 // R SFA 30-49, stable > 50 CIA and EIA stenosis; L > 50 CIA stenosis (likely represents severe stenosis or short segment occlusion)  . Peripheral neuropathy   . Pneumonia Sept 2012  . Stroke ([place]) 04/16/2001   small right cerebellar infarct on 04/16/2001 at that time he was found to have proximal left vertebral artery, proximal left common carotid artery and both external carotid artery stenosis as well as intracranial stenosis involving mid basilar artery- 07/2011 add questionable TIA    Past Surgical History:  Procedure Laterality Date  . ABDOMINAL AORTOGRAM W/LOWER EXTREMITY Bilateral 05/30/2019   Procedure: ABDOMINAL AORTOGRAM W/LOWER EXTREMITY;  Surgeon: Wellington Hampshire, MD;  Location: [facility];  Service: Cardiovascular;  Laterality: Bilateral;  .  ANGIOPLASTY  1989   right coronary artery  . ANGIOPLASTY  1992   LAD and OM  . ANGIOPLASTY  1998   First DX  . BRAIN SURGERY     on prior records  . CORONARY ARTERY BYPASS GRAFT  11/12/2009   LIMA to LAD, SVG to OM and SVG to RCA  . CORONARY STENT PLACEMENT  2000   Stent to LAD/Circumflex with angioplasty to first diagonal   . LEFT HEART CATH AND CORS/GRAFTS ANGIOGRAPHY N/Salinas 10/13/2017   Procedure: LEFT HEART CATH AND CORS/GRAFTS  ANGIOGRAPHY;  Surgeon: Nelva Bush, MD;  Location: [place];  Service: Cardiovascular;  Laterality: N/Salinas;     Current Outpatient Medications  Medication Sig Dispense Refill  . BD INSULIN SYRINGE U/F 31G X 5/16" 0.5 ML MISC Inject 0.52-0.55 mL into the skin 2 (two) times daily as directed. DX E11.22 180 each 1  . Carboxymethylcell-Hypromellose (GENTEAL OP) Place 1 drop into both eyes daily as needed (dry eyes).    . clopidogrel (PLAVIX) 75 MG tablet TAKE 1 TABLET BY MOUTH EVERY DAY 90 tablet 3  . dofetilide (TIKOSYN) 125 MCG capsule TAKE 1 CAPSULE BY MOUTH TWICE Salinas DAY 180 capsule 3  . ezetimibe (ZETIA) 10 MG tablet Take 1 tablet (10 mg total) by mouth daily. 90 tablet 3  . glipiZIDE (GLUCOTROL) 10 MG tablet Take 1 tablet (10 mg total) by mouth 2 (two) times daily before Salinas meal. 180 tablet 1  . insulin glargine (LANTUS) 100 UNIT/ML injection 52-55 units subq inj. BID 30 mL 5  . Lancets (ONETOUCH DELICA PLUS [id number]) MISC ONETOUCH VERIO LANCETS USE TO TEST YOUR BLOOD SUGAR TWICE Salinas DAY FINGER STICK    . loratadine (CLARITIN) 10 MG tablet Take 10 mg by mouth daily as needed for allergies.    . metoprolol tartrate (LOPRESSOR) 25 MG tablet TAKE 0.5 TABLETS (12.5 MG TOTAL) BY MOUTH 2 (TWO) TIMES DAILY. 90 tablet 2  . nitroGLYCERIN (NITROSTAT) 0.4 MG SL tablet PLACE 1 TABLET (0.4 MG TOTAL) UNDER THE TONGUE EVERY 5 (FIVE) MINUTES AS NEEDED FOR CHEST PAIN. 25 tablet 12  . olmesartan (BENICAR) 20 MG tablet Take 1 tablet (20 mg total) by mouth daily. 90 tablet 1  . Omega-3 Fatty Acids (FISH OIL) 1000 MG CAPS Take by mouth.    Glory Rosebush VERIO test strip USE WITH METER TWICE PER DAY DX DM TYPE 2    . rosuvastatin (CRESTOR) 20 MG tablet Take 1 tablet (20 mg total) by mouth at bedtime. 90 tablet 3  . XARELTO 20 MG TABS tablet TAKE 1 TABLET (20 MG TOTAL) BY MOUTH DAILY WITH SUPPER. 30 tablet 0   No current facility-administered medications for this visit.    Allergies:   Codeine, Lisinopril,  Nsaids, and Latex    Social History:  The patient  reports that he has been smoking cigarettes. He has Salinas 54.00 pack-year smoking history. He has never used smokeless tobacco. He reports current alcohol use. He reports that he does not use drugs.   Family History:  The patient's family history includes Asthma in his brother; Depression in his mother; Diabetes in his brother and sister; Early death in his mother; Heart attack in his father; Heart disease in his father; Hypertension in his father; Kidney disease in his mother; Ovarian cancer in his mother; Stroke in an other family member.    ROS:  Please see the history of present illness.   Otherwise, review of systems are positive for none.  All other systems are reviewed and negative.    PHYSICAL EXAM: VS:  BP 130/82   Pulse (!) 56   Ht 5\' 8"  (1.727 Marvin)   Wt 186 lb 9.6 oz (84.6 kg)   SpO2 98%   BMI 28.37 kg/Marvin  , BMI Body mass index is 28.37 kg/Marvin. GEN: Well nourished, well developed, in no acute distress  HEENT: normal  Neck: no JVD,  or masses .  Bilateral carotid bruits. Cardiac: RRR; no murmurs, rubs, or gallops,no edema  Respiratory:  clear to auscultation bilaterally, normal work of breathing GI: soft, nontender, nondistended, + BS MS: no deformity or atrophy  Skin: warm and dry, no rash Neuro:  Strength and sensation are intact Psych: euthymic mood, full affect  Vascular: Femoral pulses are slightly diminished bilaterally.  Posterior tibial is palpable on the left side  EKG:  EKG is not ordered today.   Recent Labs: 01/24/2019: TSH 0.60 05/23/2019: Hemoglobin 13.1; Platelets 326 10/01/2019: ALT 16; BUN 20; Creatinine, Ser 1.65; Magnesium 2.0; Potassium 5.2; Sodium 136    Lipid Panel    Component Value Date/Time   CHOL 110 10/01/2019 0944   TRIG 204 (H) 10/01/2019 0944   HDL 42 10/01/2019 0944   CHOLHDL 2.6 10/01/2019 0944   CHOLHDL 4.1 10/12/2017 0411   VLDL 18 10/12/2017 0411   LDLCALC 36 10/01/2019 0944       Wt Readings from Last 3 Encounters:  12/25/19 186 lb 9.6 oz (84.6 kg)  11/26/19 183 lb 2 oz (83.1 kg)  10/29/19 182 lb 4 oz (82.7 kg)        PAD Screen 11/30/2016  Previous PAD dx? Yes  Previous surgical procedure? No  Pain with walking? Yes  Subsides with rest? No  Feet/toe relief with dangling? Yes  Painful, non-healing ulcers? No  Extremities discolored? No      ASSESSMENT AND PLAN:  1.  Peripheral arterial disease: Status post  stent placement to the left common iliac artery for subtotal occlusion.  Current left hip and knee discomfort does not seem to be vascular and likely due to arthritis.  He has palpable pulses on the left side and recent duplex showed patent iliac stent.  2. Coronary artery disease without angina: Continue medical therapy.  He is on clopidogrel  3. Tobacco use: He reports that he cut down to few cigarettes Salinas day.  I discussed the importance of smoking cessation.  He wants something to help him quit and I prescribed Chantix.  4. Carotid disease: Carotid Doppler progression to 40 to 59% range.  Repeat study in November of this year  5. Hyperlipidemia: Currently on rosuvastatin and Zetia.  Most recent LDL was 49.  6.  Persistent atrial fibrillation: Maintaining in sinus rhythm with dofetilide.  On long-term anticoagulation with Xarelto with no bleeding issues.     Disposition:   FU with me in 6 month.  I advised him to follow-up with his primary care physician regarding the need for hip x-ray.  Signed,  Kathlyn Sacramento, MD  12/25/2019 8:15 AM    [place]

## 2019-12-25 ENCOUNTER — Telehealth: Payer: Self-pay

## 2019-12-25 ENCOUNTER — Encounter: Payer: Self-pay | Admitting: Cardiovascular Disease

## 2019-12-25 ENCOUNTER — Other Ambulatory Visit: Payer: Self-pay

## 2019-12-25 ENCOUNTER — Ambulatory Visit: Payer: Medicare Other | Admitting: Cardiovascular Disease

## 2019-12-25 VITALS — BP 130/82 | HR 56 | Ht 68.0 in | Wt 186.6 lb

## 2019-12-25 DIAGNOSIS — I779 Disorder of arteries and arterioles, unspecified: Secondary | ICD-10-CM

## 2019-12-25 DIAGNOSIS — I2510 Atherosclerotic heart disease of native coronary artery without angina pectoris: Secondary | ICD-10-CM

## 2019-12-25 DIAGNOSIS — I4819 Other persistent atrial fibrillation: Secondary | ICD-10-CM | POA: Diagnosis not present

## 2019-12-25 DIAGNOSIS — I739 Peripheral vascular disease, unspecified: Secondary | ICD-10-CM | POA: Diagnosis not present

## 2019-12-25 DIAGNOSIS — Z720 Tobacco use: Secondary | ICD-10-CM

## 2019-12-25 DIAGNOSIS — E785 Hyperlipidemia, unspecified: Secondary | ICD-10-CM | POA: Diagnosis not present

## 2019-12-25 MED ORDER — VARENICLINE TARTRATE 1 MG PO TABS: 1.0000 mg | ORAL_TABLET | Freq: Two times a day (BID) | ORAL | 1 refills | Status: DC

## 2019-12-25 MED ORDER — CHANTIX STARTING MONTH PAK 0.5 MG X 11 & 1 MG X 42 PO TABS: ORAL_TABLET | ORAL | 0 refills | Status: DC

## 2019-12-25 NOTE — Patient Instructions (Signed)
Medication Instructions:  Chantix:  Take one 0.5 mg tablet by mouth once daily for 3 days, then increase to one 0.5 mg tablet twice daily for 4 days, then increase to one 1 mg tablet twice daily.Two prescriptions have been sent into your pharmacy. One for the starter pack for the first month and then the one that will be started after the first month. You may pick up the Chantix 1 mg twice daily after the starter pack has been completed.   *If you need a refill on your cardiac medications before your next appointment, please call your pharmacy*   Lab Work: None ordered If you have labs (blood work) drawn today and your tests are completely normal, you will receive your results only by: Marland Kitchen MyChart Message (if you have MyChart) OR . A paper copy in the mail If you have any lab test that is abnormal or we need to change your treatment, we will call you to review the results.   Testing/Procedures: None ordered   Follow-Up: At [facility], you and your health needs are our priority.  As part of our continuing mission to provide you with exceptional heart care, we have created designated Provider Care Teams.  These Care Teams include your primary Cardiologist (physician) and Advanced Practice Providers (APPs -  Physician Assistants and Nurse Practitioners) who all work together to provide you with the care you need, when you need it.  We recommend signing up for the patient portal called "MyChart".  Sign up information is provided on this After Visit Summary.  MyChart is used to connect with patients for Virtual Visits (Telemedicine).  Patients are able to view lab/test results, encounter notes, upcoming appointments, etc.  Non-urgent messages can be sent to your provider as well.   To learn more about what you can do with MyChart, go to [email or web address].    Your next appointment:   6 month(s)  The format for your next appointment:   In Person  Provider:   Kathlyn Sacramento, MD

## 2019-12-25 NOTE — Telephone Encounter (Signed)
Reviewed chart.  CrCl has wavered between 47-57 in the past year, based on SCr fluctuations.  Will have him continue with current dose for 3 more months then re-evaluate.  Will lower dose at that time should Clearance remain < 50.

## 2019-12-25 NOTE — Telephone Encounter (Signed)
Appointment scheduled w/ Dr Raoul Pitch at first availability 01/03/20.

## 2019-12-25 NOTE — Telephone Encounter (Signed)
Patient called in stating that he went to see his cardiologist and he suggest that the patient come into the office to get x rays    Please call and advise

## 2019-12-25 NOTE — Telephone Encounter (Signed)
34M 84.6KG SCR 1.65 CCR 47 LOVW/ARIDA 12/25/19 PT REQUESTS 20MG  XARELTO WHEN CCR PUTS HIM AT 15MG  WILL ROUTE TO PHARMD POOL FOR FURTHER REVIEW

## 2019-12-25 NOTE — Telephone Encounter (Signed)
Please review for refill. Thanks!  

## 2019-12-27 ENCOUNTER — Other Ambulatory Visit: Payer: Self-pay | Admitting: *Deleted

## 2019-12-27 NOTE — Patient Outreach (Signed)
[place] [facility]) Care Management  12/27/2019  Marvin Salinas 1945-09-22 [id number]   RN Health Coach Initial Assessment  Referral Date:  12/03/2019 Referral Source:  EMMI Prevent Screening Reason for Referral:  Disease Management Education Insurance:  [employer]   Outreach Attempt:  Outreach attempt #2 to patient for initial telephone assessment.  Patient answered and stated he was waiting on an important phone call.  Requested to call this RN Health Coach back.   Plan:  RN Health Coach will make another outreach attempt to patient within the month of August if no return call back from patient.   [place] [phone] [email or web address]

## 2019-12-31 DIAGNOSIS — J343 Hypertrophy of nasal turbinates: Secondary | ICD-10-CM | POA: Diagnosis not present

## 2019-12-31 DIAGNOSIS — J342 Deviated nasal septum: Secondary | ICD-10-CM | POA: Diagnosis not present

## 2019-12-31 DIAGNOSIS — J310 Chronic rhinitis: Secondary | ICD-10-CM | POA: Diagnosis not present

## 2020-01-03 ENCOUNTER — Other Ambulatory Visit: Payer: Self-pay

## 2020-01-03 ENCOUNTER — Ambulatory Visit (INDEPENDENT_AMBULATORY_CARE_PROVIDER_SITE_OTHER): Payer: Medicare Other | Admitting: Family Medicine

## 2020-01-03 ENCOUNTER — Encounter: Payer: Self-pay | Admitting: Family Medicine

## 2020-01-03 VITALS — BP 134/74 | HR 57 | Temp 97.2°F | Resp 18 | Ht 68.0 in | Wt 185.0 lb

## 2020-01-03 DIAGNOSIS — M533 Sacrococcygeal disorders, not elsewhere classified: Secondary | ICD-10-CM | POA: Insufficient documentation

## 2020-01-03 DIAGNOSIS — M25562 Pain in left knee: Secondary | ICD-10-CM | POA: Diagnosis not present

## 2020-01-03 DIAGNOSIS — G8929 Other chronic pain: Secondary | ICD-10-CM | POA: Diagnosis not present

## 2020-01-03 DIAGNOSIS — M25552 Pain in left hip: Secondary | ICD-10-CM | POA: Diagnosis not present

## 2020-01-03 HISTORY — DX: Sacrococcygeal disorders, not elsewhere classified: M53.3

## 2020-01-03 HISTORY — DX: Pain in left hip: M25.552

## 2020-01-03 NOTE — Patient Instructions (Signed)
Sacroiliac Joint Dysfunction  Sacroiliac joint dysfunction is a condition that causes inflammation on one or both sides of the sacroiliac (SI) joint. The SI joint connects the lower part of the spine (sacrum) with the two upper portions of the pelvis (ilium). This condition causes deep aching or burning pain in the low back. In some cases, the pain may also spread into one or both buttocks, hips, or thighs. What are the causes? This condition may be caused by:  Pregnancy. During pregnancy, extra stress is put on the SI joints because the pelvis widens.  Injury, such as: ? Injuries from car accidents. ? Sports-related injuries. ? Work-related injuries.  Having one leg that is shorter than the other.  Conditions that affect the joints, such as: ? Rheumatoid arthritis. ? Gout. ? Psoriatic arthritis. ? Joint infection (septic arthritis). Sometimes, the cause of SI joint dysfunction is not known. What are the signs or symptoms? Symptoms of this condition include:  Aching or burning pain in the lower back. The pain may also spread to other areas, such as: ? Buttocks. ? Groin. ? Thighs.  Muscle spasms in or around the painful areas.  Increased pain when standing, walking, running, stair climbing, bending, or lifting. How is this diagnosed? This condition is diagnosed with a physical exam and medical history. During the exam, the health care provider may move one or both of your legs to different positions to check for pain. Various tests may be done to confirm the diagnosis, including:  Imaging tests to look for other causes of pain. These may include: ? MRI. ? CT scan. ? Bone scan.  Diagnostic injection. A numbing medicine is injected into the SI joint using a needle. If your pain is temporarily improved or stopped after the injection, this can indicate that SI joint dysfunction is the problem. How is this treated? Treatment depends on the cause and severity of your condition.  Treatment options may include:  Ice or heat applied to the lower back area after an injury. This may help reduce pain and muscle spasms.  Medicines to relieve pain or inflammation or to relax the muscles.  Wearing a back brace (sacroiliac brace) to help support the joint while your back is healing.  Physical therapy to increase muscle strength around the joint and flexibility at the joint. This may also involve learning proper body positions and ways of moving to relieve stress on the joint.  Direct manipulation of the SI joint.  Injections of steroid medicine into the joint to reduce pain and swelling.  Radiofrequency ablation to burn away nerves that are carrying pain messages from the joint.  Use of a device that provides electrical stimulation to help reduce pain at the joint.  Surgery to put in screws and plates that limit or prevent joint motion. This is rare. Follow these instructions at home: Medicines  Take over-the-counter and prescription medicines only as told by your health care provider.  Do not drive or use heavy machinery while taking prescription pain medicine.  If you are taking prescription pain medicine, take actions to prevent or treat constipation. Your health care provider may recommend that you: ? Drink enough fluid to keep your urine pale yellow. ? Eat foods that are high in fiber, such as fresh fruits and vegetables, whole grains, and beans. ? Limit foods that are high in fat and processed sugars, such as fried or sweet foods. ? Take an over-the-counter or prescription medicine for constipation. If you have a brace:    Wear the brace as told by your health care provider. Remove it only as told by your health care provider.  Keep the brace clean.  If the brace is not waterproof: ? Do not let it get wet. ? Cover it with a watertight covering when you take a bath or a shower. Managing pain, stiffness, and swelling      Icing can help with pain and  swelling. Heat may help with muscle tension or spasms. Ask your health care provider if you should use ice or heat.  If directed, put ice on the affected area: ? If you have a removable brace, remove it as told by your health care provider. ? Put ice in a plastic bag. ? Place a towel between your skin and the bag. ? Leave the ice on for 20 minutes, 2-3 times a day.  If directed, apply heat to the affected area. Use the heat source that your health care provider recommends, such as a moist heat pack or a heating pad. ? Place a towel between your skin and the heat source. ? Leave the heat on for 20-30 minutes. ? Remove the heat if your skin turns bright red. This is especially important if you are unable to feel pain, heat, or cold. You may have a greater risk of getting burned. General instructions  Rest as needed. Ask your health care provider what activities are safe for you.  Return to your normal activities as told by your health care provider.  Exercise as directed by your health care provider or physical therapist.  Do not use any products that contain nicotine or tobacco, such as cigarettes and e-cigarettes. These can delay bone healing. If you need help quitting, ask your health care provider.  Keep all follow-up visits as told by your health care provider. This is important. Contact a health care provider if:  Your pain is not controlled with medicine.  You have a fever.  Your pain is getting worse. Get help right away if:  You have weakness, numbness, or tingling in your legs or feet.  You lose control of your bladder or bowel. Summary  Sacroiliac joint dysfunction is a condition that causes inflammation on one or both sides of the sacroiliac (SI) joint.  This condition causes deep aching or burning pain in the low back. In some cases, the pain may also spread into one or both buttocks, hips, or thighs.  Treatment depends on the cause and severity of your condition.  It may include medicines to reduce pain and swelling or to relax muscles. This information is not intended to replace advice given to you by your health care provider. Make sure you discuss any questions you have with your health care provider. Document Revised: 02/01/2018 Document Reviewed: 07/18/2017 Elsevier Patient Education  2020 [employer]  

## 2020-01-03 NOTE — Progress Notes (Signed)
This visit occurred during the SARS-CoV-2 public health emergency.  Safety protocols were in place, including screening questions prior to the visit, additional usage of staff PPE, and extensive cleaning of exam room while observing appropriate contact time as indicated for disinfecting solutions.    Marvin Salinas , [birth year], 74 y.o., male MRN: [id number] Patient Care Team    Relationship Specialty Notifications Start End  Ma Hillock, DO PCP - General Family Medicine  01/24/19   [name], Wonda Cheng, MD PCP - Cardiology Cardiology Admissions 04/03/19   Chesley Mires, MD Consulting Physician Pulmonary Disease  01/26/19   Laurena Slimmer, MD Referring Physician Nephrology  01/26/19   Constance Haw, MD Consulting Physician Cardiology  01/26/19   Calvert Cantor, MD Consulting Physician Ophthalmology  01/26/19   Garvin Fila, MD Consulting Physician Neurology  01/26/19   Love, Alyson Locket, MD Consulting Physician Neurology  01/26/19   Leona Singleton, RN Tensed Management  Admissions 11/13/19     Chief Complaint  Patient presents with  . Hip Pain    Left sided hip pain x1.5 yrs. Getting worse.      Subjective: Pt presents for an OV with complaints of left hip pain for a few years duration- worsening over the last 1.5 yrs.  Associated symptoms include Pain with everyday activities. He reports pain both posterior and anterior hip, along with sharp pain lateral aspect of leg. He has fallen x1 when stepping out of his car secondary to a sharp shootong pain that made him lose his footing. He also has discomfort left knee and left ankle. Tightness in his hands on occassions- especially morning. He has no personal or fhx of inflammatory arthritis. His brother has osteoarthritis.  He has a hx of PAD, under the care of cardio, w/ prior h/o severe claudication 05/2019 duplex with heavily calcified occ of left common iliac artery and mod right common iliac a. He has underwent a  angioplasty/stent  Placement of left common iliac artery.  Pt has tried tylenol to ease their symptoms.   Depression screen [facility] 2/9 01/24/2019  Decreased Interest 0  Down, Depressed, Hopeless 0  PHQ - 2 Score 0  Altered sleeping 0  Tired, decreased energy 0  Change in appetite 0  Feeling bad or failure about yourself  0  Trouble concentrating 0  Moving slowly or fidgety/restless 0  Suicidal thoughts 0  PHQ-9 Score 0  Difficult doing work/chores Not difficult at all    Allergies  Allergen Reactions  . Codeine Nausea And Vomiting  . Lisinopril Cough  . Nsaids     CKD  . Latex Hives   Social History   Social History Narrative   Marital status/children/pets: divorced.   Education/employment: retired, Patient has his GED. 9th grade education.    Safety:      -smoke alarm in the home:Yes     - wears seatbelt: Yes   Patient is right handed.   Patient does not drink any caffeine.   Past Medical History:  Diagnosis Date  . Acute kidney injury superimposed on chronic kidney disease ([place]) 10/12/2017  . Atrial fibrillation with RVR ([place]) 10/27/2017  . Back pain   . Basilar artery stenosis    on chronic Plavix  . Benign prostatic hypertrophy without urinary obstruction 04/17/2014  . Carotid artery disease ([place]) 10/06/2010   Carotid US 04/2019: Bilat ICA 40-59; L subclavian stenosis >> repeat 1 year  . Cerebral vascular disease  with prior TIA's; followed by Dr. Erling Cruz  . Depression, neurotic 11/21/2013  . Diabetes mellitus    on insulin  . Enthesopathy of ankle and tarsus 09/04/2007   Overview:  Metatarsalgia   . History of renal calculi   . Hyperlipidemia   . Hypertension   . Ischemic heart disease    prior PCI to RCA in 1989. S/P PCI to LAD and OM in 1992. S/P PCI to first DX in 2000. S/P CABG x 3 in May 2011  . OSA (obstructive sleep apnea) 01/11/2018   AHI 18.1 and SaO2 low 73%  . PAD (peripheral artery disease) ([place]) 11/18/2017   ABIs/Arterial US 04/2019: R 1.21; L 0.66  // R SFA 30-49, stable > 50 CIA and EIA stenosis; L > 50 CIA stenosis (likely represents severe stenosis or short segment occlusion)  . Peripheral neuropathy   . Pneumonia Sept 2012  . Stroke ([place]) 04/16/2001   small right cerebellar infarct on 04/16/2001 at that time he was found to have proximal left vertebral artery, proximal left common carotid artery and both external carotid artery stenosis as well as intracranial stenosis involving mid basilar artery- 07/2011 add questionable TIA   Past Surgical History:  Procedure Laterality Date  . ABDOMINAL AORTOGRAM W/LOWER EXTREMITY Bilateral 05/30/2019   Procedure: ABDOMINAL AORTOGRAM W/LOWER EXTREMITY;  Surgeon: Wellington Hampshire, MD;  Location: [place] [facility];  Service: Cardiovascular;  Laterality: Bilateral;  . ANGIOPLASTY  1989   right coronary artery  . ANGIOPLASTY  1992   LAD and OM  . ANGIOPLASTY  1998   First DX  . BRAIN SURGERY     on prior records  . CORONARY ARTERY BYPASS GRAFT  11/12/2009   LIMA to LAD, SVG to OM and SVG to RCA  . CORONARY STENT PLACEMENT  2000   Stent to LAD/Circumflex with angioplasty to first diagonal   . LEFT HEART CATH AND CORS/GRAFTS ANGIOGRAPHY N/A 10/13/2017   Procedure: LEFT HEART CATH AND CORS/GRAFTS ANGIOGRAPHY;  Surgeon: Nelva Bush, MD;  Location: [place] [facility];  Service: Cardiovascular;  Laterality: N/A;   Family History  Problem Relation Age of Onset  . Depression Mother   . Early death Mother   . Kidney disease Mother   . Ovarian cancer Mother   . Hypertension Father   . Heart disease Father   . Heart attack Father   . Asthma Brother   . Diabetes Brother   . Stroke Other        Uncle  . Diabetes Sister    Allergies as of 01/03/2020      Reactions   Codeine Nausea And Vomiting   Lisinopril Cough   Nsaids    CKD   Latex Hives      Medication List       Accurate as of January 03, 2020  9:42 AM. If you have any questions, ask your nurse or doctor.        BD Insulin  Syringe U/F 31G X 5/16" 0.5 ML Misc Generic drug: Insulin Syringe-Needle U-100 Inject 0.52-0.55 mL into the skin 2 (two) times daily as directed. DX E11.22   Chantix Starting Month Pak 0.5 MG X 11 & 1 MG X 42 tablet Generic drug: varenicline Take one 0.5 mg tablet by mouth once daily for 3 days, then increase to one 0.5 mg tablet twice daily for 4 days, then increase to one 1 mg tablet twice daily.   varenicline 1 MG tablet Commonly known as: Chantix Continuing  Month Pak Take 1 tablet (1 mg total) by mouth 2 (two) times daily.   clopidogrel 75 MG tablet Commonly known as: PLAVIX TAKE 1 TABLET BY MOUTH EVERY DAY   dofetilide 125 MCG capsule Commonly known as: TIKOSYN TAKE 1 CAPSULE BY MOUTH TWICE A DAY   ezetimibe 10 MG tablet Commonly known as: ZETIA Take 1 tablet (10 mg total) by mouth daily.   Fish Oil 1000 MG Caps Take by mouth.   GENTEAL OP Place 1 drop into both eyes daily as needed (dry eyes).   glipiZIDE 10 MG tablet Commonly known as: GLUCOTROL Take 1 tablet (10 mg total) by mouth 2 (two) times daily before a meal.   insulin glargine 100 UNIT/ML injection Commonly known as: LANTUS 52-55 units subq inj. BID   loratadine 10 MG tablet Commonly known as: CLARITIN Take 10 mg by mouth daily as needed for allergies.   metoprolol tartrate 25 MG tablet Commonly known as: LOPRESSOR TAKE 0.5 TABLETS (12.5 MG TOTAL) BY MOUTH 2 (TWO) TIMES DAILY.   nitroGLYCERIN 0.4 MG SL tablet Commonly known as: NITROSTAT PLACE 1 TABLET (0.4 MG TOTAL) UNDER THE TONGUE EVERY 5 (FIVE) MINUTES AS NEEDED FOR CHEST PAIN.   olmesartan 20 MG tablet Commonly known as: BENICAR Take 1 tablet (20 mg total) by mouth daily.   OneTouch Delica Plus [id number] Misc ONETOUCH VERIO LANCETS USE TO TEST YOUR BLOOD SUGAR TWICE A DAY FINGER STICK   OneTouch Verio test strip Generic drug: glucose blood USE WITH METER TWICE PER DAY DX DM TYPE 2   rosuvastatin 20 MG tablet Commonly known as:  CRESTOR Take 1 tablet (20 mg total) by mouth at bedtime.   Xarelto 20 MG Tabs tablet Generic drug: rivaroxaban TAKE 1 TABLET (20 MG TOTAL) BY MOUTH DAILY WITH SUPPER.       All past medical history, surgical history, allergies, family history, immunizations andmedications were updated in the EMR today and reviewed under the history and medication portions of their EMR.     ROS: Negative, with the exception of above mentioned in HPI   Objective:  BP 134/74 (BP Location: Right Arm, Patient Position: Sitting, Cuff Size: Normal)   Pulse (!) 57   Temp (!) 97.2 F (36.2 C) (Temporal)   Resp 18   Ht 5\' 8"  (1.727 m)   Wt 185 lb (83.9 kg)   SpO2 100%   BMI 28.13 kg/m  Body mass index is 28.13 kg/m. Gen: Afebrile. No acute distress. Nontoxic in appearance, well developed, well nourished.  HENT: AT. [place].  Eyes:Pupils Equal Round Reactive to light, Extraocular movements intact,  Conjunctiva without redness, discharge or icterus. MSK (LLE): no erythema, TTP left SI. +FABRE left for both anterior hip pain (mild) and Mod left SI pain. + FABRE right for left SI pain. +left SLR. Mild MS weakness left plantar/dorsi flexion of foot.  Neuro: Normal gait. PERLA. EOMi. Alert. Oriented x3  Psych: Normal affect, dress and demeanor. Normal speech. Normal thought content and judgment.  No exam data present No results found. No results found for this or any previous visit (from the past 24 hour(s)).  Assessment/Plan: RADEK CARNERO is a 74 y.o. male present for OV for  Left hip pain/Sacroiliac joint dysfunction of left side/left knee pain Exam today consistent with Left SI joint pain and left anterior hip discomfort.  Most likely arthritis of hip is present, however his SI pain seems to be more bothersome to him on exam.  - also concern for lower  back etiology given his mild weakness on LEFT LE exam.  - offered xray now and referral to ortho vs referral to ortho (which will complete xray in office)  and he elected ortho referral. Agree this is a responsible approach.  - may need to have lower lumbar/sacral eval for cause- however currently he is more focused on evaluation of hip/SI/Knee - PAIN: NSAIDS contraindicated on chronic anticoag and CKD. Tramadol has not been helpful in the past for him. Offered short term Vicodin script and he declined- he prefers not to start opoid.  - Ambulatory referral to Orthopedic Surgery    Reviewed expectations re: course of current medical issues.  Discussed self-management of symptoms.  Outlined signs and symptoms indicating need for more acute intervention.  Patient verbalized understanding and all questions were answered.  Patient received an After-Visit Summary.    Orders Placed This Encounter  Procedures  . Ambulatory referral to Orthopedic Surgery   No orders of the defined types were placed in this encounter.   Referral Orders     Ambulatory referral to Orthopedic Surgery   Note is dictated utilizing voice recognition software. Although note has been proof read prior to signing, occasional typographical errors still can be missed. If any questions arise, please do not hesitate to call for verification.   electronically signed by:  Howard Pouch, DO  [place]

## 2020-01-11 HISTORY — PX: OTHER SURGICAL HISTORY: SHX169

## 2020-01-17 ENCOUNTER — Other Ambulatory Visit: Payer: Self-pay | Admitting: *Deleted

## 2020-01-17 NOTE — Patient Outreach (Signed)
[facility]) Care Management  01/17/2020  Marvin Salinas 16-Jul-1945 [id number]   [place] Initial Assessment  Referral Date:12/03/2019 Referral Source:EMMI Prevent Screening Reason for Referral:Disease Management Education Insurance:[employer] Medicare   Outreach Attempt:  Outreach attempt #3 to patient for initial telephone assessment.  Patient answered and stated he was driving; requesting call back.   Plan:  RN Health Coach will make another outreach attempt within the month of August per patients request.    Marvin Azure RN [place] [phone] [email or web address]

## 2020-01-21 ENCOUNTER — Telehealth: Payer: Self-pay | Admitting: Cardiovascular Disease

## 2020-01-21 DIAGNOSIS — M5416 Radiculopathy, lumbar region: Secondary | ICD-10-CM | POA: Insufficient documentation

## 2020-01-21 DIAGNOSIS — M545 Low back pain: Secondary | ICD-10-CM | POA: Diagnosis not present

## 2020-01-21 NOTE — Telephone Encounter (Signed)
Message fwd to Dr. Arida to advise. 

## 2020-01-21 NOTE — Telephone Encounter (Signed)
Dr. Latanya Maudlin would like to order an MRI on patient. Please call and advise if he can do this with his heart condition.

## 2020-01-23 NOTE — Telephone Encounter (Signed)
An MRI is fine from my standpoint.

## 2020-01-23 NOTE — Telephone Encounter (Signed)
Returned the call to [place] at Dr. Charlestine Night office. Tammy made aware of Dr. Tyrell Antonio response.  Tammy verbalized understanding and voiced appreciation for the call.

## 2020-02-08 DIAGNOSIS — M5416 Radiculopathy, lumbar region: Secondary | ICD-10-CM | POA: Diagnosis not present

## 2020-02-14 DIAGNOSIS — M545 Low back pain: Secondary | ICD-10-CM | POA: Diagnosis not present

## 2020-02-14 DIAGNOSIS — M5416 Radiculopathy, lumbar region: Secondary | ICD-10-CM | POA: Diagnosis not present

## 2020-02-15 ENCOUNTER — Encounter: Payer: Self-pay | Admitting: *Deleted

## 2020-02-15 ENCOUNTER — Other Ambulatory Visit: Payer: Self-pay | Admitting: *Deleted

## 2020-02-15 NOTE — Patient Outreach (Signed)
[facility]) Care Management  [place]  02/15/2020   Marvin Salinas 1945/12/17 [id number]   RN Health Coach Initial Assessment   Referral Date:  12/03/2019 Referral Source:  EMMI Prevent Screening Reason for Referral:  Disease Management Education Insurance:  [employer]   Outreach Attempt:  Successful telephone outreach to patient for initial telephone assessment.  HIPAA verified with patient.  RN Health Coach reintroduced self and role.  Patient verbally agrees to Disease Management outreaches.  Completed initial telephone assessment.  Social: Patient lives at home with friend.  Reports being independent with ADLs and IADLs.  Ambulates independently and denies any falls within the past year.  States he or his friend provides transportation to medical appointments.  DME in the home include:  Standard walker, straight cane, blood pressure cuff, lower dentures, CBG meter, scale, and eyeglasses.  Conditions:  Per chart review and discussion with patient, PMH includes but not limited to:  Chronic kidney disease, atrial fibrillation, back pain, basilar artery stenosis, benign prostatic hypertrophy, carotid artery disease, Stroke, depression, diabetes, hyperlipidemia, hypertension, ischemic heart disease with previous stent placements and coronary artery bypass grafting, peripheral artery disease, obstructive sleep apnea, and peripheral neuropathy.  Denies any recent emergency room visits or hospitalizations.  Does report continued back and groin/hip pain and states he was told he has a pinched nerve and plans to have injection next week. Latest Hgb A1C 10.5 in July 2021.  Fasting blood sugar this morning was 200 with recent fasting ranges of 190-200 and afternoon ranges of 240-250's. Patient relates his uncontrolled blood sugars to being less active.  Unable to exercise or walk for long distances due to back and leg pain.  Continues to smoke.  Smoking cessation  reviewed and discussed.  Medications: Reports taking about 10 medications.  Manages medications himself with weekly pill box fills.  Denies any issues with affording medications at this time.  Encounter Medications:  Outpatient Encounter Medications as of 02/15/2020  Medication Sig Note  . Carboxymethylcell-Hypromellose (GENTEAL OP) Place 1 drop into both eyes daily as needed (dry eyes).   . cholecalciferol (VITAMIN D3) 25 MCG (1000 UNIT) tablet Take 1,000 Units by mouth daily.   . clopidogrel (PLAVIX) 75 MG tablet TAKE 1 TABLET BY MOUTH EVERY DAY   . co-enzyme Q-10 30 MG capsule Take 30 mg by mouth daily.   Marland Kitchen dofetilide (TIKOSYN) 125 MCG capsule TAKE 1 CAPSULE BY MOUTH TWICE A DAY   . ezetimibe (ZETIA) 10 MG tablet Take 1 tablet (10 mg total) by mouth daily.   Marland Kitchen glipiZIDE (GLUCOTROL) 10 MG tablet Take 1 tablet (10 mg total) by mouth 2 (two) times daily before a meal.   . insulin glargine (LANTUS) 100 UNIT/ML injection 52-55 units subq inj. BID   . loratadine (CLARITIN) 10 MG tablet Take 10 mg by mouth daily as needed for allergies.   . metoprolol tartrate (LOPRESSOR) 25 MG tablet TAKE 0.5 TABLETS (12.5 MG TOTAL) BY MOUTH 2 (TWO) TIMES DAILY.   . nitroGLYCERIN (NITROSTAT) 0.4 MG SL tablet PLACE 1 TABLET (0.4 MG TOTAL) UNDER THE TONGUE EVERY 5 (FIVE) MINUTES AS NEEDED FOR CHEST PAIN.   Marland Kitchen olmesartan (BENICAR) 20 MG tablet Take 1 tablet (20 mg total) by mouth daily.   . rosuvastatin (CRESTOR) 20 MG tablet Take 1 tablet (20 mg total) by mouth at bedtime.   Alveda Reasons 20 MG TABS tablet TAKE 1 TABLET (20 MG TOTAL) BY MOUTH DAILY WITH SUPPER.   Marland Kitchen BD  INSULIN SYRINGE U/F 31G X 5/16" 0.5 ML MISC Inject 0.52-0.55 mL into the skin 2 (two) times daily as directed. DX E11.22   . Lancets (ONETOUCH DELICA PLUS [id number]) MISC ONETOUCH VERIO LANCETS USE TO TEST YOUR BLOOD SUGAR TWICE A DAY FINGER STICK   . Omega-3 Fatty Acids (FISH OIL) 1000 MG CAPS Take by mouth. (Patient not taking: Reported on 02/15/2020)  02/15/2020: Reports not taking at this time  . ONETOUCH VERIO test strip USE WITH METER TWICE PER DAY DX DM TYPE 2   . varenicline (CHANTIX CONTINUING MONTH PAK) 1 MG tablet Take 1 tablet (1 mg total) by mouth 2 (two) times daily. (Patient not taking: Reported on 02/15/2020) 02/15/2020: Reports not taking, pharmacy did not have  . varenicline (CHANTIX STARTING MONTH PAK) 0.5 MG X 11 & 1 MG X 42 tablet Take one 0.5 mg tablet by mouth once daily for 3 days, then increase to one 0.5 mg tablet twice daily for 4 days, then increase to one 1 mg tablet twice daily.    No facility-administered encounter medications on file as of 02/15/2020.    Functional Status:  No flowsheet data found.  Fall/Depression Screening: Fall Risk  01/24/2019  Falls in the past year? 0  Number falls in past yr: 0  Injury with Fall? 0   PHQ 2/9 Scores 02/15/2020 01/24/2019  PHQ - 2 Score 0 0  PHQ- 9 Score - 0   Goals Addressed              This Visit's Progress   .  Patient will report decrease in Hgb A1C by 0.2 points within the next 90 days. (pt-stated)        CARE PLAN ENTRY (see longtitudinal plan of care for additional care plan information)  Objective:  Lab Results  Component Value Date   HGBA1C 10.5 (A) 11/26/2019   HGBA1C 10.5 11/26/2019   HGBA1C 10.5 (A) 11/26/2019   HGBA1C 10.5 (A) 11/26/2019 .   Lab Results  Component Value Date   CREATININE 1.65 (H) 10/01/2019   CREATININE 1.35 (H) 06/01/2019   CREATININE 1.33 (H) 05/22/2019 .   Marland Kitchen No results found for: EGFR  Current Barriers:  Marland Kitchen Knowledge Deficits related to basic Diabetes pathophysiology and self care/management . Decreased ability to exercise or be active  Case Manager Clinical Goal(s):  Over the next 90 days, patient will demonstrate improved adherence to prescribed treatment plan for diabetes self care/management as evidenced by: decreasing Hgb A1C by 0.2 points . Verbalize daily monitoring and recording of CBG within 90  days . Verbalize adherence to ADA/ carb modified diet within the next 90 days . Verbalize exercise 2 days/week . Verbalize adherence to prescribed medication regimen within the next 90 days  . Verbalize fasting blood sugars are ranging below 150 within the next 90 days  Interventions:  . Provided education to patient about basic DM disease process . Reviewed medications with patient and discussed importance of medication adherence . Discussed plans with patient for ongoing care management follow up and provided patient with direct contact information for care management team . Provided patient with written educational materials related to hypo and hyperglycemia and importance of correct treatment . Reviewed scheduled/upcoming provider appointments including: follow up with primary care provider . Advised patient, providing education and rationale, to check cbg twice a day or as provider prescribed and record, calling primary care provider for findings outside established parameters.   . Smoking cessation discussed and encouraged; sending EMMI Smoking  Cessation . Sending [place] [facility] Exercise Program Activity Booklet, encouraged patient to increase activity as tolerated  Patient Self Care Activities:  . Self administers oral medications as prescribed . Self administers insulin as prescribed . Attends all scheduled provider appointments . Checks blood sugars as prescribed and utilize hyper and hypoglycemia protocol as needed . Adheres to prescribed ADA/carb modified . Increases activity as tolerated  Initial goal documentation       Appointments: Attended appointment with primary care provider, Dr. Raoul Pitch on 01/03/2020.  Has scheduled appointments with Cardiology on 03/31/2020 and Nephrology on 04/10/2020.  Advanced Directives:  Denies having Advance Directive in place and does not wish to create one at this time.  States he has the paperwork available.   Consent: [facility] services reviewed and  discussed.  Patient verbally agrees to Disease Management outreaches.  Plan: RN Health Coach will send primary MD barriers letter. RN Health Coach will route initial telephone assessment note to primary MD. [place] will send patient Living Well with Diabetes Educational Packet. RN Health Coach will send patient 2021 [facility] Calender Booklet. RN Health Coach will send patient EMMI Quitting Smoking. RN Health Coach will send patient [facility] Exercise Program Activity Booklet. RN Health Coach will make next telephone outreach to patient in the month of September and patient agrees to future outreach.  [place] [phone] [email or web address]

## 2020-02-20 ENCOUNTER — Other Ambulatory Visit: Payer: Self-pay | Admitting: Family Medicine

## 2020-02-21 DIAGNOSIS — M5416 Radiculopathy, lumbar region: Secondary | ICD-10-CM | POA: Diagnosis not present

## 2020-02-28 ENCOUNTER — Telehealth: Payer: Self-pay

## 2020-02-28 DIAGNOSIS — I4819 Other persistent atrial fibrillation: Secondary | ICD-10-CM

## 2020-02-28 NOTE — Telephone Encounter (Signed)
   [facility] Pre-operative Risk Assessment    HEARTCARE STAFF: - Please ensure there is not already an duplicate clearance open for this procedure. - Under Visit Info/Reason for Call, type in Other and utilize the format Clearance MM/DD/YY or Clearance TBD. Do not use dashes or single digits. - If request is for dental extraction, please clarify the # of teeth to be extracted.  Request for surgical clearance:  1. What type of surgery is being performed? ESI Injection  2. When is this surgery scheduled? TBD   3. What type of clearance is required (medical clearance vs. Pharmacy clearance to hold med vs. Both)? Both  4. Are there any medications that need to be held prior to surgery and how long? Hold blood thinner 5 days prior to procedure   5. Practice name and name of physician performing surgery? [facility]   6. What is the office phone number? [phone] ext [phone]   7.   What is the office fax number? [phone] ATTN: Marlowe Alt  8.   Anesthesia type (None, local, MAC, general) ? None specified   Bobby Rumpf 02/28/2020, 3:44 PM  _________________________________________________________________   (provider comments below)

## 2020-03-03 NOTE — Telephone Encounter (Signed)
Pt has been scheduled to see Dr. Acie Fredrickson tomorrow, 03/04/20 at 1:40.  Will fax back to the requesting surgeon's office to make them aware.

## 2020-03-03 NOTE — Telephone Encounter (Signed)
Patient with diagnosis of afib on Xarelto for anticoagulation.    Procedure: ESI injection Date of procedure: TBD  CHADS2-VASc score of 7 (age x2, HTN, DM, CAD/PAD, stroke)  CrCl 72mL/min Platelet count 326K  Typically hold Xarelto for 3 days prior to spinal procedures, however pt is at elevated cardiovascular risk due to hx of afib and stroke. Will need to defer to MD regarding anticoag length of hold.  He is also on Xarelto 20mg  daily but qualifies for 15mg  daily dosing due to CrCl < 50. He has appt next month with plans to recheck BMP at that time. If CrCl remains < 50, will need Xarelto dose reduction to 15mg  daily.

## 2020-03-03 NOTE — Telephone Encounter (Signed)
I spoke with Marvin Salinas, he is having more lower extremity edema, more dyspnea on exertion and palpitation. He suspect he has gone back into atrial fibrillation. Given the change in his symptom, I recommended a earlier office visit prior to final cardiac clearance. He has a upcoming follow up with Marvin Salinas, callback pool and scheduling to see if there is an earlier appt available with Marvin Salinas or his APP for cardiac clearance.

## 2020-03-03 NOTE — Telephone Encounter (Signed)
Left message for the patient to call back and speak to the preop APP. Dr. Acie Fredrickson, if he has no chest pain or worsening dyspnea, would you be ok to hold plavix for 7 days prior ESI injection?  Also see note by clinical pharmacist regarding Xarelto  Please forward your response to P CV DIV PREOP

## 2020-03-03 NOTE — Telephone Encounter (Signed)
Clinical pharmacist to review Xarelto 

## 2020-03-04 ENCOUNTER — Encounter: Payer: Self-pay | Admitting: Cardiovascular Disease

## 2020-03-04 ENCOUNTER — Ambulatory Visit: Payer: Medicare Other | Admitting: Cardiovascular Disease

## 2020-03-04 ENCOUNTER — Other Ambulatory Visit: Payer: Self-pay

## 2020-03-04 VITALS — BP 128/58 | HR 74 | Ht 68.0 in | Wt 184.4 lb

## 2020-03-04 DIAGNOSIS — E785 Hyperlipidemia, unspecified: Secondary | ICD-10-CM | POA: Diagnosis not present

## 2020-03-04 DIAGNOSIS — I4819 Other persistent atrial fibrillation: Secondary | ICD-10-CM

## 2020-03-04 DIAGNOSIS — I10 Essential (primary) hypertension: Secondary | ICD-10-CM

## 2020-03-04 DIAGNOSIS — E118 Type 2 diabetes mellitus with unspecified complications: Secondary | ICD-10-CM

## 2020-03-04 DIAGNOSIS — I2510 Atherosclerotic heart disease of native coronary artery without angina pectoris: Secondary | ICD-10-CM

## 2020-03-04 DIAGNOSIS — Z794 Long term (current) use of insulin: Secondary | ICD-10-CM

## 2020-03-04 DIAGNOSIS — I739 Peripheral vascular disease, unspecified: Secondary | ICD-10-CM | POA: Diagnosis not present

## 2020-03-04 DIAGNOSIS — I779 Disorder of arteries and arterioles, unspecified: Secondary | ICD-10-CM

## 2020-03-04 NOTE — Progress Notes (Addendum)
Marvin Salinas Date of Birth  1945-10-26       [facility] [address], [address], [place], [place]  [address]   [place], [place]  [address] [phone]     [phone]   Fax  [phone]    Fax [phone]  Problem List: 1. Coronary artery disease-status post CABG-2001 2. Hypertension 3. Hyperlipidemia 4. Diabetes mellitus 5. Extensive cerebrovascular disease with prior TIAs and known basilar artery stenosis    Marvin Salinas is seen back today for a 3 month check.  He is a former patient of Dr. Susa Simmonds. He has known CAD with prior CABG back in 2011. Other issues include HTN, HLD, DM and extensive cerebral vascular disease with prior TIA's and known basilar artery stenosis. He is on chronic Plavix and aspirin therapy. Was on coumadin in the remote past.  Pt has done well. Complains of back pain.  He gets some regular exercise - walks on occasion.  September 08, 2015: Mr. Saab is seen back after a 3-4 year absence. Former patient of [facility].  CAD - CABG in 2011,   Cerebral vascular disease   Doing well,  No CP , no further TIAs  HR is slow,  No syncope or presyncope.   HR has been slow for a while  Exercises,  Still paints houses.   Oct 20, 2016:  Marvin Salinas is doing well from a cardiac [place]. No CP or dyspnea Has leg cramp / burning with exercise Is on pravachol for hyperlipidemia,  Has not tolerated the other statins   Sept. 26, 2019:  Marvin Salinas has done well.  He was admitted to the hospital in April, 2019 with a non-ST segment elevation myocardial infarction.  He also had developed atrial flutter with rapid rates.  He spontaneously converted to sinus rhythm.  He was started on Tikosyn at that time.  Heart catheterization revealed patent grafts.  Has had rare episodes of chest pressure - thinks it was indigestion. Processes on a regular basis.  He walks regularly.  He also has a history of hyperlipidemia and diabetes  mellitus.  October 01, 2019:  Marvin Salinas is seen today for follow-up of his coronary artery disease, hyperlipidemia, atrial fibrillation and peripheral arterial disease.   Dr. Fletcher Anon performed successful angioplasty and stent placement to the left common iliac artery.  He is been doing well with minimal claudication since that time. Still having knee pain and hip pain .   No CP  Still smokes - wants to quit.    Sept. 14, 2021  Marvin Salinas is seen today for follow up of his CAD, atrial fib, PAD  Still smoking  Sees Mujhamad Arida - had peripheral stenting in Dec.  Has cad, - hx of CABG Needs a back injection He is at low risk for his upcoming injection and may hold his xarelto for 3 days prior to injection     Current Outpatient Medications on File Prior to Visit  Medication Sig Dispense Refill  . BD INSULIN SYRINGE U/F 31G X 5/16" 0.5 ML MISC Inject 0.52-0.55 mL into the skin 2 (two) times daily as directed. DX E11.22 180 each 1  . Carboxymethylcell-Hypromellose (GENTEAL OP) Place 1 drop into both eyes daily as needed (dry eyes).    . cholecalciferol (VITAMIN D3) 25 MCG (1000 UNIT) tablet Take 1,000 Units by mouth daily.    . clopidogrel (PLAVIX) 75 MG tablet TAKE 1 TABLET BY MOUTH EVERY DAY 90  tablet 3  . co-enzyme Q-10 30 MG capsule Take 30 mg by mouth daily.    Marland Kitchen dofetilide (TIKOSYN) 125 MCG capsule TAKE 1 CAPSULE BY MOUTH TWICE A DAY 180 capsule 3  . ezetimibe (ZETIA) 10 MG tablet Take 1 tablet (10 mg total) by mouth daily. 90 tablet 3  . fluticasone (FLONASE) 50 MCG/ACT nasal spray Place 2 sprays into both nostrils daily.    Marland Kitchen gabapentin (NEURONTIN) 100 MG capsule Take 2 capsules by mouth at bedtime.    Marland Kitchen glipiZIDE (GLUCOTROL) 10 MG tablet Take 1 tablet (10 mg total) by mouth 2 (two) times daily before a meal. 180 tablet 1  . insulin glargine (LANTUS) 100 UNIT/ML injection 52-55 units subq inj. BID 30 mL 5  . Lancets (ONETOUCH DELICA PLUS [id number]) MISC ONETOUCH VERIO LANCETS USE TO TEST  YOUR BLOOD SUGAR TWICE A DAY FINGER STICK    . loratadine (CLARITIN) 10 MG tablet Take 10 mg by mouth daily as needed for allergies.    . metoprolol tartrate (LOPRESSOR) 25 MG tablet TAKE 0.5 TABLETS (12.5 MG TOTAL) BY MOUTH 2 (TWO) TIMES DAILY. 90 tablet 2  . nitroGLYCERIN (NITROSTAT) 0.4 MG SL tablet PLACE 1 TABLET (0.4 MG TOTAL) UNDER THE TONGUE EVERY 5 (FIVE) MINUTES AS NEEDED FOR CHEST PAIN. 25 tablet 12  . olmesartan (BENICAR) 20 MG tablet Take 1 tablet (20 mg total) by mouth daily. 90 tablet 1  . ONETOUCH VERIO test strip USE WITH METER TWICE PER DAY DX DM TYPE 2    . rosuvastatin (CRESTOR) 20 MG tablet Take 1 tablet (20 mg total) by mouth at bedtime. 90 tablet 3  . XARELTO 20 MG TABS tablet TAKE 1 TABLET (20 MG TOTAL) BY MOUTH DAILY WITH SUPPER. 90 tablet 0   No current facility-administered medications on file prior to visit.     Allergies  Allergen Reactions  . Codeine Nausea And Vomiting  . Lisinopril Cough  . Nsaids     CKD  . Latex Hives    Past Medical History:  Diagnosis Date  . Acute kidney injury superimposed on chronic kidney disease ([facility]) 10/12/2017  . Atrial fibrillation with RVR ([facility]) 10/27/2017  . Back pain   . Basilar artery stenosis    on chronic Plavix  . Benign prostatic hypertrophy without urinary obstruction 04/17/2014  . Carotid artery disease ([facility]) 10/06/2010   Carotid US 04/2019: Bilat ICA 40-59; L subclavian stenosis >> repeat 1 year  . Cerebral vascular disease    with prior TIA's; followed by Dr. Erling Cruz  . Depression, neurotic 11/21/2013  . Diabetes mellitus    on insulin  . Enthesopathy of ankle and tarsus 09/04/2007   Overview:  Metatarsalgia   . History of renal calculi   . Hyperlipidemia   . Hypertension   . Ischemic heart disease    prior PCI to RCA in 1989. S/P PCI to LAD and OM in 1992. S/P PCI to first DX in 2000. S/P CABG x 3 in May 2011  . OSA (obstructive sleep apnea) 01/11/2018   AHI 18.1 and SaO2 low 73%  . PAD (peripheral artery  disease) ([facility]) 11/18/2017   ABIs/Arterial US 04/2019: R 1.21; L 0.66 // R SFA 30-49, stable > 50 CIA and EIA stenosis; L > 50 CIA stenosis (likely represents severe stenosis or short segment occlusion)  . Peripheral neuropathy   . Pneumonia Sept 2012  . Stroke ([facility]) 04/16/2001   small right cerebellar infarct on 04/16/2001 at that time he was found to  have proximal left vertebral artery, proximal left common carotid artery and both external carotid artery stenosis as well as intracranial stenosis involving mid basilar artery- 07/2011 add questionable TIA    Past Surgical History:  Procedure Laterality Date  .  NASAL ENDOSCOPY  01/11/2020   chronic rhinitis w/o evidence of acute sinusitis, bilateral inferior turbinate hypertrophy  . ABDOMINAL AORTOGRAM W/LOWER EXTREMITY Bilateral 05/30/2019   Procedure: ABDOMINAL AORTOGRAM W/LOWER EXTREMITY;  Surgeon: Wellington Hampshire, MD;  Location: [facility];  Service: Cardiovascular;  Laterality: Bilateral;  . ANGIOPLASTY  1989   right coronary artery  . ANGIOPLASTY  1992   LAD and OM  . ANGIOPLASTY  1998   First DX  . BRAIN SURGERY     on prior records  . CORONARY ARTERY BYPASS GRAFT  11/12/2009   LIMA to LAD, SVG to OM and SVG to RCA  . CORONARY STENT PLACEMENT  2000   Stent to LAD/Circumflex with angioplasty to first diagonal   . LEFT HEART CATH AND CORS/GRAFTS ANGIOGRAPHY N/A 10/13/2017   Procedure: LEFT HEART CATH AND CORS/GRAFTS ANGIOGRAPHY;  Surgeon: Nelva Bush, MD;  Location: [facility];  Service: Cardiovascular;  Laterality: N/A;    Social History   Tobacco Use  Smoking Status Current Every Day Smoker  . Packs/day: 0.25  . Years: 54.00  . Pack years: 13.50  . Types: Cigarettes  Smokeless Tobacco Never Used    Social History   Substance and Sexual Activity  Alcohol Use Yes    Family History  Problem Relation Age of Onset  . Depression Mother   . Early death Mother   . Kidney disease Mother   . Ovarian  cancer Mother   . Hypertension Father   . Heart disease Father   . Heart attack Father   . Asthma Brother   . Diabetes Brother   . Stroke Other        Uncle  . Diabetes Sister     Reviw of Systems:  Reviewed in the HPI.  All other systems are negative.  Physical Exam: Blood pressure (!) 128/58, pulse 74, height 5\' 8"  (1.727 m), weight 184 lb 6.4 oz (83.6 kg), SpO2 99 %.  GEN:  Elderly male ,  Chronically ill  HEENT: Normal NECK: No JVD; bilateral carotid bruits.  LYMPHATICS: No lymphadenopathy CARDIAC: RRR , no murmurs, rubs, gallops RESPIRATORY:  Clear to auscultation without rales, wheezing or rhonchi  ABDOMEN: Soft, non-tender, non-distended MUSCULOSKELETAL:  No edema; No deformity  SKIN: Warm and dry NEUROLOGIC:  Alert and oriented x 3   ECG:   Sept. 14, 2021:   NSR at 74.  No ST or T wave changes to suggest ische;mia    Assessment / Plan:   1. Coronary artery disease is not having any episodes of angina..   2.  Atrial fib:   In NSR today  .   2. Hypertension -    BP is well controlled.    3. Hyperlipidemia - labs from April look ok.  Trigs are elevated    4. Diabetes mellitus -  5.  Carotid artery disease:   Has bilateral bruits.   Managed by Dr. Fletcher Anon   6.  Back pain:   He needs to have a back injection.   He is at low risk for his back injection .  He may hold his xarelto for 3 days prior to injection     Mertie Moores, MD  03/04/2020 2:09 PM  [place],  [place], [place]  [address] Pager [phone] Phone: [phone]; Fax: [phone]

## 2020-03-04 NOTE — Patient Instructions (Signed)

## 2020-03-05 NOTE — Telephone Encounter (Signed)
He is at low risk for his back injection .  He may hold his xarelto for 3 days prior to injection

## 2020-03-07 NOTE — Telephone Encounter (Signed)
Dr. Fletcher Anon, Please review plavix, can the patient hold plavix for a few days prior to back injection from [facility] perspective. Last PV angiography was in Dec 2020 at which time stent was placed in L common iliac artery.   Please forward your response to P CV DIV PREOP.   Thank you.

## 2020-03-08 NOTE — Telephone Encounter (Signed)
Hold Plavix 5 days before.  °

## 2020-03-10 NOTE — Telephone Encounter (Addendum)
Hi Phil, I finalized clearance to the surgeon's office but in reading the notes from earlier in this chain there was a plan for him to have a repeat BMET drawn at his follow-up due to CrCl potentially qualifying him for lower dose Xarelto. (PharmD note: He is also on Xarelto 20mg  daily but qualifies for 15mg  daily dosing due to CrCl < 50. He has appt next month with plans to recheck BMP at that time. If CrCl remains < 50, will need Xarelto dose reduction to 15mg  daily.") I do not see any plans for labs in your OV last week. Can you take a look and address with your nurse? Otherwise we're done with the chart in pre-op, just wanted to make sure this was noted. Jarika Robben

## 2020-03-10 NOTE — Telephone Encounter (Signed)
Pt returning call

## 2020-03-10 NOTE — Telephone Encounter (Addendum)
   Primary Cardiologist: Mertie Moores, MD  Chart reviewed as part of pre-operative protocol coverage. Patient was contacted 03/10/2020 in reference to pre-operative risk assessment for pending surgery as outlined below.  Marvin Salinas was last seen on 03/04/20 by Dr. Acie Fredrickson and felt to be clinically stable. Dr. Acie Fredrickson felt he was low risk for his back injection.   Per Dr. Acie Fredrickson, he may hold his Xarelto for 3 days prior to injection. Per Dr. Fletcher Anon, hold Plavix 5 days before.   I will route this recommendation to the requesting party via Epic fax function and remove from pre-op pool. Please call with questions.  It also appears there was a previous question raised about Xarelto dosing, will forward this message to Dr. Acie Fredrickson to address (not specifically a preop issue).  [name], PA-C 03/10/2020, 8:49 AM

## 2020-03-10 NOTE — Telephone Encounter (Signed)
Left message to call back  

## 2020-03-10 NOTE — Telephone Encounter (Signed)
Valetta Fuller,  Will you order a bmp on Marvin Salinas. His renal function is on the borderline of needing 20 mg vs. 15 mg of xarelto  Thanks  [employer]

## 2020-03-11 NOTE — Telephone Encounter (Signed)
Scheduled patient for BMET next Tuesday. He was grateful for assistance.

## 2020-03-11 NOTE — Addendum Note (Signed)
Addended by: Harland German A on: 03/11/2020 12:50 PM   Modules accepted: Orders

## 2020-03-11 NOTE — Telephone Encounter (Signed)
    Pt is calling back to follow up  

## 2020-03-11 NOTE — Telephone Encounter (Signed)
Marvin Salinas this came back to the pre op pool. He has already been cleared with instructions on what to do with his Plavix and Xarelto pre op.  It looks like he needs a BMP at some point to decide if he should be on 15 mg or 20 mg of Xarelto- see Dr Elmarie Shiley note. I spoke with the patient today to relay the pre op instructions and told him he will need a BMP if you could order this.  This will NOT hold up his surgery.   Thanks  Kerin Ransom PA-C 03/11/2020 9:40 AM

## 2020-03-13 DIAGNOSIS — M5416 Radiculopathy, lumbar region: Secondary | ICD-10-CM | POA: Diagnosis not present

## 2020-03-14 ENCOUNTER — Other Ambulatory Visit: Payer: Self-pay | Admitting: *Deleted

## 2020-03-14 ENCOUNTER — Encounter: Payer: Self-pay | Admitting: *Deleted

## 2020-03-14 NOTE — Patient Outreach (Addendum)
[facility]) Care Management  03/14/2020  MARQUES ERICSON 12/18/1945 [id number]   [facility] Monthly Outreach  Referral Date:12/03/2019 Referral Source:EMMI Prevent Screening Reason for Referral:Disease Management Education Insurance:[employer] Medicare   Outreach Attempt:  Successful telephone outreach to patient for follow up. HIPAA verified with patient.  Patient reporting he had steroid injection in his back yesterday. States he does feel slightly better with a little decrease in back pain.  Patient is reporting he had some chest tightness and felt like his heart was racing with activity yesterday.  States those symptoms are gone today.  Reports blood pressure was 118/57, heart rate of 87 and he has resumed his Plavix and plans to resume his Xarelto tonight.  Patient encouraged to contact Cardiologist if symptoms returned and he stated his understanding.  Fasting blood sugar this morning was 340, but patient states fasting ranges of 90-130's prior to steroid injection.  Encouraged patient to contact PCP if blood sugars continued to be elevated post injection.  Reports he is hoping to quit smoking soon and has managed to decrease to about 2 cigarettes a day.  Smoking cessation reviewed and encouraged.  Appointments:  Attended cardiology appointment on 03/04/2020.  Plan: RN Health Coach will make next telephone outreach to patient within the month of October and patient agrees to future outreach.  [place] [phone] [email or web address]

## 2020-03-18 ENCOUNTER — Other Ambulatory Visit: Payer: Self-pay

## 2020-03-18 ENCOUNTER — Other Ambulatory Visit: Payer: Medicare Other

## 2020-03-18 ENCOUNTER — Other Ambulatory Visit: Payer: Self-pay | Admitting: Cardiovascular Disease

## 2020-03-18 DIAGNOSIS — I4819 Other persistent atrial fibrillation: Secondary | ICD-10-CM | POA: Diagnosis not present

## 2020-03-18 LAB — BASIC METABOLIC PANEL
BUN/Creatinine Ratio: 19 (ref 10–24)
BUN: 30 mg/dL — ABNORMAL HIGH (ref 8–27)
CO2: 20 mmol/L (ref 20–29)
Calcium: 9 mg/dL (ref 8.6–10.2)
Chloride: 102 mmol/L (ref 96–106)
Creatinine, Ser: 1.57 mg/dL — ABNORMAL HIGH (ref 0.76–1.27)
GFR calc Af Amer: 49 mL/min/{1.73_m2} — ABNORMAL LOW (ref >59)
GFR calc non Af Amer: 42 mL/min/{1.73_m2} — ABNORMAL LOW (ref >59)
Glucose: 342 mg/dL — ABNORMAL HIGH (ref 65–99)
Potassium: 4.7 mmol/L (ref 3.5–5.2)
Sodium: 135 mmol/L (ref 134–144)

## 2020-03-18 NOTE — Telephone Encounter (Signed)
Pt last saw Dr Acie Fredrickson 03/04/20, last labs 10/01/19 Creat 1.65, age 74, weight 83.6kg, CrCl 45.74, based on CrCl pt is not on the appropriate dosage of Xarelto 20mg  QD.  Pt's Creat has been historically elevated.  Please advise if dosage change to Xarelto 15mg  QD is appropriate.  Message sent to Dr Acie Fredrickson, will await response.

## 2020-03-18 NOTE — Telephone Encounter (Signed)
Will take care of switching pt from Xarelto 20mg  QD to Eliquis 5mg  BID.  Called spoke with pt, he is coming into our office today for BMP will await lab results before refilling rx.  Advised pt per Dr Harrington Challenger, given kidney function fluctuations Creat 1.3-1.8, Eliquis is our best alternative to Xarelto for full dose anticoagulation at present.  Pt states he was going to ask about switching at next OV he is agreeable to switch to Eliquis 5mg  BID and discontinue Xarelto at next refill.  Pt states he is not out of Xarelto at present.  Advised when he runs out of Xarelto to discontinue the Xarelto and start taking the Eliquis 5mg  twice daily.  Start with taking evening dosage, don't take Xarelto but instead take the evening dosage of Eliquis when he would previously be taking the Xarelto, then continue taking twice daily once in am and once in pm approximately 12 hours apart.

## 2020-03-18 NOTE — Telephone Encounter (Signed)
-----   Message from Fay Records, MD sent at 03/18/2020  8:37 AM EDT ----- Yes.  What do I need to do? ----- Message ----- From: Brynda Peon, RN Sent: 03/18/2020   8:24 AM EDT To: Fay Records, MD  Discussed with Lenna Sciara, pharmacist and we agree at this point Eliquis 5mg  BID is the better option since his Creatinine is fluctuating, but his age and weight are well within criteria for full dose Eliquis.  Ok to switch to Eliquis 5mg  BID? ----- Message ----- From: Fay Records, MD Sent: 03/18/2020   8:12 AM EDT To: Brynda Peon, RN  Abbe [place] is out of town.  I am covering   Cr is up and down  1.3 to 1.8   ? If Xarelto the best choice    Do you think Eliquis would be better? ----- Message ----- From: Brynda Peon, RN Sent: 03/18/2020   7:57 AM EDT To: Thayer Headings, MD  Pt last saw you on 03/04/20, last labs 10/01/19 Creat 1.65, age 74, weight 83.6kg, CrCl 45.74, based on CrCl pt is not on the appropriate dosage of Xarelto 20mg  QD.  Pt's Creat has been historically elevated.  Please advise if dosage change to Xarelto 15mg  QD is appropriate.  Thanks!

## 2020-03-20 MED ORDER — APIXABAN 5 MG PO TABS: 5.0000 mg | ORAL_TABLET | Freq: Two times a day (BID) | ORAL | 1 refills | Status: DC

## 2020-03-20 NOTE — Telephone Encounter (Signed)
Pt had BMP on 03/18/20 Creat 1.57, CrCl 48.07.  Per Dr Alan Ripper recommendation while Dr Acie Fredrickson on vacation, and my previous discussion with pt,  will switch pt to Eliquis 5mg  BID.  Pt is aware of new rx, will discontinue Xarelto on pt's medication list and add Eliquis 5mg  BID.  Pt is aware to start the Eliquis the next day in the pm after his current Xarelto rx has been completed.  Discontinue Xarelto and continue on Eliquis 5mg  BID.

## 2020-03-31 ENCOUNTER — Ambulatory Visit: Payer: Medicare Other | Admitting: Physician Assistant

## 2020-04-03 ENCOUNTER — Other Ambulatory Visit: Payer: Self-pay | Admitting: *Deleted

## 2020-04-03 ENCOUNTER — Encounter: Payer: Self-pay | Admitting: *Deleted

## 2020-04-03 NOTE — Patient Outreach (Signed)
[facility]) Care Management  04/03/2020  Marvin Salinas 1946-03-17 [id number]   RN Health Coach Monthly Outreach  Referral Date:12/03/2019 Referral Source:EMMI Prevent Screening Reason for Referral:Disease Management Education Insurance:[employer] Medicare   Outreach Attempt:  Successful telephone outreach to patient for follow up.  HIPAA verified with patient. Patient reporting back pain is about the same or worse since injection.  Encouraged him to discuss pain with provider.  Does report he has not smoked since injection.  Congratulated patient and praised him on this accomplishment to quit smoking.  Reports blood sugars have been much better.  Fasting blood sugar this morning was 140 with recent fasting ranges of 80-150's.  Does report he has been having periods of shortness of breath and chest pains with activity.  Encouraged patient to contact Cardiology for appointment to discuss these symptoms.  Also states he has been having blisters breakouts and is wondering if it is a side effect of his medication.  Discussed [facility] referral for medication reconciliation and patient verbally agrees.  Appointments:  Needs to schedule primary care follow up and appointment with Cardiologist.  Plan: RN Health Coach will place [place] referral for medication reconciliation. RN Health Coach will make next telephone outreach to patient within the month of November and patient verbally agrees to future outreach.  [place] [phone] [email or web address]

## 2020-04-04 NOTE — Patient Outreach (Signed)
Referral from Hubert Azure, RN to [place] for Medication assistance.

## 2020-04-10 DIAGNOSIS — N1831 Chronic kidney disease, stage 3a: Secondary | ICD-10-CM | POA: Diagnosis not present

## 2020-04-10 DIAGNOSIS — I10 Essential (primary) hypertension: Secondary | ICD-10-CM | POA: Diagnosis not present

## 2020-04-17 DIAGNOSIS — H35033 Hypertensive retinopathy, bilateral: Secondary | ICD-10-CM | POA: Diagnosis not present

## 2020-04-17 DIAGNOSIS — E113292 Type 2 diabetes mellitus with mild nonproliferative diabetic retinopathy without macular edema, left eye: Secondary | ICD-10-CM | POA: Diagnosis not present

## 2020-04-17 DIAGNOSIS — H02834 Dermatochalasis of left upper eyelid: Secondary | ICD-10-CM | POA: Diagnosis not present

## 2020-04-17 DIAGNOSIS — H02831 Dermatochalasis of right upper eyelid: Secondary | ICD-10-CM | POA: Diagnosis not present

## 2020-04-17 DIAGNOSIS — H25813 Combined forms of age-related cataract, bilateral: Secondary | ICD-10-CM | POA: Diagnosis not present

## 2020-04-17 LAB — HM DIABETES EYE EXAM

## 2020-04-18 DIAGNOSIS — M5416 Radiculopathy, lumbar region: Secondary | ICD-10-CM | POA: Diagnosis not present

## 2020-04-21 ENCOUNTER — Other Ambulatory Visit: Payer: Self-pay | Admitting: Family Medicine

## 2020-04-24 ENCOUNTER — Other Ambulatory Visit: Payer: Self-pay

## 2020-04-24 ENCOUNTER — Other Ambulatory Visit (HOSPITAL_COMMUNITY): Payer: Self-pay | Admitting: Physician Assistant

## 2020-04-24 ENCOUNTER — Ambulatory Visit (HOSPITAL_COMMUNITY)
Admission: RE | Admit: 2020-04-24 | Discharge: 2020-04-24 | Disposition: A | Discharge status: Home / Self Care | Payer: Medicare Other | Source: Ambulatory Visit | Attending: Cardiology | Admitting: Cardiology

## 2020-04-24 DIAGNOSIS — I6523 Occlusion and stenosis of bilateral carotid arteries: Secondary | ICD-10-CM

## 2020-04-28 ENCOUNTER — Encounter: Payer: Self-pay | Admitting: Physician Assistant

## 2020-04-29 ENCOUNTER — Other Ambulatory Visit: Payer: Self-pay

## 2020-04-29 ENCOUNTER — Encounter: Payer: Self-pay | Admitting: Family Medicine

## 2020-04-29 ENCOUNTER — Ambulatory Visit (INDEPENDENT_AMBULATORY_CARE_PROVIDER_SITE_OTHER): Payer: Medicare Other | Admitting: Family Medicine

## 2020-04-29 VITALS — BP 114/64 | HR 66 | Temp 97.3°F | Ht 68.0 in | Wt 181.0 lb

## 2020-04-29 DIAGNOSIS — E785 Hyperlipidemia, unspecified: Secondary | ICD-10-CM

## 2020-04-29 DIAGNOSIS — I4891 Unspecified atrial fibrillation: Secondary | ICD-10-CM | POA: Diagnosis not present

## 2020-04-29 DIAGNOSIS — Z23 Encounter for immunization: Secondary | ICD-10-CM

## 2020-04-29 DIAGNOSIS — I10 Essential (primary) hypertension: Secondary | ICD-10-CM

## 2020-04-29 DIAGNOSIS — D649 Anemia, unspecified: Secondary | ICD-10-CM | POA: Diagnosis not present

## 2020-04-29 DIAGNOSIS — N1831 Chronic kidney disease, stage 3a: Secondary | ICD-10-CM | POA: Diagnosis not present

## 2020-04-29 DIAGNOSIS — I25810 Atherosclerosis of coronary artery bypass graft(s) without angina pectoris: Secondary | ICD-10-CM

## 2020-04-29 DIAGNOSIS — E1149 Type 2 diabetes mellitus with other diabetic neurological complication: Secondary | ICD-10-CM | POA: Diagnosis not present

## 2020-04-29 LAB — CBC
HCT: 28 % — ABNORMAL LOW (ref 39.0–52.0)
Hemoglobin: 8.4 g/dL — ABNORMAL LOW (ref 13.0–17.0)
MCHC: 30.1 g/dL (ref 30.0–36.0)
MCV: 64.7 fl — ABNORMAL LOW (ref 78.0–100.0)
Platelets: 378 10*3/uL (ref 150.0–400.0)
RBC: 4.33 Mil/uL (ref 4.22–5.81)
RDW: 18 % — ABNORMAL HIGH (ref 11.5–15.5)
WBC: 9.1 10*3/uL (ref 4.0–10.5)

## 2020-04-29 LAB — COMPREHENSIVE METABOLIC PANEL
ALT: 13 U/L (ref 0–53)
AST: 15 U/L (ref 0–37)
Albumin: 4.4 g/dL (ref 3.5–5.2)
Alkaline Phosphatase: 56 U/L (ref 39–117)
BUN: 26 mg/dL — ABNORMAL HIGH (ref 6–23)
CO2: 24 mEq/L (ref 19–32)
Calcium: 9.2 mg/dL (ref 8.4–10.5)
Chloride: 104 mEq/L (ref 96–112)
Creatinine, Ser: 1.47 mg/dL (ref 0.40–1.50)
GFR: 46.48 mL/min — ABNORMAL LOW (ref >60.00)
Glucose, Bld: 206 mg/dL — ABNORMAL HIGH (ref 70–99)
Potassium: 4.6 mEq/L (ref 3.5–5.1)
Sodium: 136 mEq/L (ref 135–145)
Total Bilirubin: 0.4 mg/dL (ref 0.2–1.2)
Total Protein: 6.4 g/dL (ref 6.0–8.3)

## 2020-04-29 LAB — POCT GLYCOSYLATED HEMOGLOBIN (HGB A1C)
HbA1c POC (<> result, manual entry): 10.3 % (ref 4.0–5.6)
HbA1c, POC (controlled diabetic range): 10.3 % — AB (ref 0.0–7.0)
HbA1c, POC (prediabetic range): 10.3 % — AB (ref 5.7–6.4)
Hemoglobin A1C: 10.3 % — AB (ref 4.0–5.6)

## 2020-04-29 LAB — VITAMIN D 25 HYDROXY (VIT D DEFICIENCY, FRACTURES): VITD: 24.35 ng/mL — ABNORMAL LOW (ref 30.00–100.00)

## 2020-04-29 LAB — TSH: TSH: 0.89 u[IU]/mL (ref 0.35–4.50)

## 2020-04-29 MED ORDER — RYBELSUS 3 MG PO TABS: ORAL_TABLET | ORAL | 0 refills | Status: DC

## 2020-04-29 MED ORDER — ROSUVASTATIN CALCIUM 20 MG PO TABS: 20.0000 mg | ORAL_TABLET | Freq: Every day | ORAL | 3 refills | Status: DC

## 2020-04-29 MED ORDER — OLMESARTAN MEDOXOMIL 20 MG PO TABS: 20.0000 mg | ORAL_TABLET | Freq: Every day | ORAL | 1 refills | Status: DC

## 2020-04-29 MED ORDER — GLIPIZIDE 10 MG PO TABS: 10.0000 mg | ORAL_TABLET | Freq: Two times a day (BID) | ORAL | 1 refills | Status: DC

## 2020-04-29 MED ORDER — RYBELSUS 7 MG PO TABS: 1.0000 | ORAL_TABLET | Freq: Every day | ORAL | 1 refills | Status: DC

## 2020-04-29 MED ORDER — INSULIN GLARGINE 100 UNIT/ML ~~LOC~~ SOLN: SUBCUTANEOUS | 5 refills | Status: DC

## 2020-04-29 MED ORDER — EZETIMIBE 10 MG PO TABS: 10.0000 mg | ORAL_TABLET | Freq: Every day | ORAL | 3 refills | Status: DC

## 2020-04-29 MED ORDER — CLOTRIMAZOLE 1 % EX CREA: 1.0000 "application " | TOPICAL_CREAM | Freq: Two times a day (BID) | CUTANEOUS | 5 refills | Status: DC

## 2020-04-29 NOTE — Progress Notes (Signed)
This visit occurred during the SARS-CoV-2 public health emergency.  Safety protocols were in place, including screening questions prior to the visit, additional usage of staff PPE, and extensive cleaning of exam room while observing appropriate contact time as indicated for disinfecting solutions.    Patient ID: Marvin Salinas, male  DOB: [birth year], 74 y.o.   MRN: [id number] Patient Care Team    Relationship Specialty Notifications Start End  Ma Hillock, DO PCP - General Family Medicine  01/24/19   Chesley Mires, MD Consulting Physician Pulmonary Disease  01/26/19   Laurena Slimmer, MD Referring Physician Nephrology  01/26/19   Constance Haw, MD Consulting Physician Cardiology  01/26/19   Calvert Cantor, MD Consulting Physician Ophthalmology  01/26/19   Garvin Fila, MD Consulting Physician Neurology  01/26/19   Love, Alyson Locket, MD Consulting Physician Neurology  01/26/19   Leona Singleton, RN [facility]  Admissions 11/13/19   [name], Wonda Cheng, MD Consulting Physician Cardiology  04/30/20     Chief Complaint  Patient presents with  . Follow-up    [facility]; pt is fasting    Subjective: Marvin Salinas is a 74 y.o.  male present for elevated sugars Type 2 diabetes mellitus with stage 3 chronic kidney disease, with long-term current use of insulin (HCC) Pt reports compliance with Lantus 52 units in the morning and 50 units nightly and glipizide 10/10 mg bid.  He has had some elevations after a meal up in the 240.  He reports his fasting blood glucose is approximately 145-185.  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  He has stopped smoking over the last 3 months!!! PNA series: Pneumonia series completed Flu shot: completed today 04/29/2020 (recommneded yearly) Foot exam: Referred to podiatry 12/2018-completed today Eye exam: 03/2020, Dr. Gwenevere Ghazi changes noted A1c: 8.9>>> 13.7 (08/22/2018)>>11 (01/24/2019)>>10.8 08/20/2019> 10.5>10.3 today  Atrial  fibrillation with RVR (HCC)/Essential hypertension/Ischemic heart disease/ S/P CABG x 3/Paroxysmal atrial flutter (HCC)/PAD (peripheral artery disease) (HCC)/Nonsustained ventricular tachycardia (HCC)/HLD/NSTEMI/h/o stroke/CKD 3/chronic anticoagulation Patient has a history of severe 3-vessel coronary artery disease with chronic total occlusions of the proximal/mid LAD, mid/distal left circumflex/and proximal RCA.  Moderate caliber D1 and distal RPDA demonstrate 70-80% stenosis.  Moderately elevated left ventricular filling pressure.  Coronary artery stents and CABG have been completed in the past.  He also has a history of cerebral ischemia in TIAs. Pt reports compliance with  Crestor, Eliquis, Benicar 20, metoprolol 12.5 mg twice daily, Zetia, tikosyn and Plavix. Blood pressures ranges at home WNL.   Patient reports he is feeling good and has no lower extremity edema. Pt is prescribed statin. Diet: Heart healthy-low-sodium diet encouraged RF: Hypertension, hyperlipidemia, former smoker, STEMI, stroke, diabetes, family history, CAD, CVD, PAD, bilateral carotid artery stenosis, TIA   Depression screen [facility] 2/9 04/29/2020 02/15/2020 01/24/2019  Decreased Interest 0 0 0  Down, Depressed, Hopeless 0 0 0  PHQ - 2 Score 0 0 0  Altered sleeping - - 0  Tired, decreased energy - - 0  Change in appetite - - 0  Feeling bad or failure about yourself  - - 0  Trouble concentrating - - 0  Moving slowly or fidgety/restless - - 0  Suicidal thoughts - - 0  PHQ-9 Score - - 0  Difficult doing work/chores - - Not difficult at all   No flowsheet data found.      Fall Risk  04/29/2020 01/24/2019  Falls in the past year? 0 0  Number falls in past yr: 0 0  Injury with Fall? 0 0  Follow up Falls evaluation completed -    Immunization History  Administered Date(s) Administered  . Fluad Quad(high Dose 65+) 02/12/2019, 04/29/2020  . Influenza Split 05/08/2013, 03/28/2017  . Influenza, High Dose Seasonal PF  05/08/2013, 03/28/2017, 05/02/2018  . Influenza,inj,Quad PF,6+ Mos 04/21/2016  . Moderna SARS-COVID-2 Vaccination 08/27/2019, 10/03/2019  . Pneumococcal Conjugate-13 07/13/2016  . Pneumococcal Polysaccharide-23 02/21/2018  . Tdap 02/15/2013  . Zoster 06/22/2015    No exam data present  Past Medical History:  Diagnosis Date  . Acute kidney injury superimposed on chronic kidney disease ([place]) 10/12/2017  . Atrial fibrillation with RVR ([place]) 10/27/2017  . Back pain   . Basilar artery stenosis    on chronic Plavix  . Benign prostatic hypertrophy without urinary obstruction 04/17/2014  . Carotid artery disease ([place]) 10/06/2010   Carotid US 04/2019: Bilat ICA 40-59; L subclavian stenosis  // Carotid US 11/21: Bilat ICA 40-59; bilateral subclavian stenosis   . Cerebral vascular disease    with prior TIA's; followed by Dr. Erling Cruz  . Depression, neurotic 11/21/2013  . Diabetes mellitus    on insulin  . Enthesopathy of ankle and tarsus 09/04/2007   Overview:  Metatarsalgia   . History of renal calculi   . Hyperlipidemia   . Hypertension   . Ischemic heart disease    prior PCI to RCA in 1989. S/P PCI to LAD and OM in 1992. S/P PCI to first DX in 2000. S/P CABG x 3 in May 2011  . OSA (obstructive sleep apnea) 01/11/2018   AHI 18.1 and SaO2 low 73%  . PAD (peripheral artery disease) ([place]) 11/18/2017   ABIs/Arterial US 04/2019: R 1.21; L 0.66 // R SFA 30-49, stable > 50 CIA and EIA stenosis; L > 50 CIA stenosis (likely represents severe stenosis or short segment occlusion)  . Peripheral neuropathy   . Pneumonia Sept 2012  . Stroke ([place]) 04/16/2001   small right cerebellar infarct on 04/16/2001 at that time he was found to have proximal left vertebral artery, proximal left common carotid artery and both external carotid artery stenosis as well as intracranial stenosis involving mid basilar artery- 07/2011 add questionable TIA   Allergies  Allergen Reactions  . Codeine Nausea And Vomiting  .  Lisinopril Cough  . Nsaids     CKD  . Latex Hives   Past Surgical History:  Procedure Laterality Date  .  NASAL ENDOSCOPY  01/11/2020   chronic rhinitis w/o evidence of acute sinusitis, bilateral inferior turbinate hypertrophy  . ABDOMINAL AORTOGRAM W/LOWER EXTREMITY Bilateral 05/30/2019   Procedure: ABDOMINAL AORTOGRAM W/LOWER EXTREMITY;  Surgeon: Wellington Hampshire, MD;  Location: [facility];  Service: Cardiovascular;  Laterality: Bilateral;  . ANGIOPLASTY  1989   right coronary artery  . ANGIOPLASTY  1992   LAD and OM  . ANGIOPLASTY  1998   First DX  . BRAIN SURGERY     on prior records  . CORONARY ARTERY BYPASS GRAFT  11/12/2009   LIMA to LAD, SVG to OM and SVG to RCA  . CORONARY STENT PLACEMENT  2000   Stent to LAD/Circumflex with angioplasty to first diagonal   . LEFT HEART CATH AND CORS/GRAFTS ANGIOGRAPHY N/A 10/13/2017   Procedure: LEFT HEART CATH AND CORS/GRAFTS ANGIOGRAPHY;  Surgeon: Nelva Bush, MD;  Location: [facility];  Service: Cardiovascular;  Laterality: N/A;   Family History  Problem Relation Age of Onset  .  Depression Mother   . Early death Mother   . Kidney disease Mother   . Ovarian cancer Mother   . Hypertension Father   . Heart disease Father   . Heart attack Father   . Asthma Brother   . Diabetes Brother   . Stroke Other        Uncle  . Diabetes Sister    Social History   Social History Narrative   Marital status/children/pets: divorced.   Education/employment: retired, Patient has his GED. 9th grade education.    Safety:      -smoke alarm in the home:Yes     - wears seatbelt: Yes   Patient is right handed.   Patient does not drink any caffeine.    Allergies as of 04/29/2020      Reactions   Codeine Nausea And Vomiting   Lisinopril Cough   Nsaids    CKD   Latex Hives      Medication List       Accurate as of April 29, 2020 11:59 PM. If you have any questions, ask your nurse or doctor.        STOP taking  these medications   gabapentin 100 MG capsule Commonly known as: NEURONTIN Stopped by: Howard Pouch, DO     TAKE these medications   apixaban 5 MG Tabs tablet Commonly known as: Eliquis Take 1 tablet (5 mg total) by mouth 2 (two) times daily. Discontinue Xarelto,   BD Insulin Syringe U/F 31G X 5/16" 0.5 ML Misc Generic drug: Insulin Syringe-Needle U-100 Inject 0.52-0.55 mL into the skin 2 (two) times daily as directed. DX E11.22   cholecalciferol 25 MCG (1000 UNIT) tablet Commonly known as: VITAMIN D3 Take 1,000 Units by mouth daily.   clopidogrel 75 MG tablet Commonly known as: PLAVIX TAKE 1 TABLET BY MOUTH EVERY DAY   clotrimazole 1 % cream Commonly known as: Clotrimazole Anti-Fungal Apply 1 application topically 2 (two) times daily. Started by: Howard Pouch, DO   co-enzyme Q-10 30 MG capsule Take 30 mg by mouth daily.   dofetilide 125 MCG capsule Commonly known as: TIKOSYN TAKE 1 CAPSULE BY MOUTH TWICE A DAY   ezetimibe 10 MG tablet Commonly known as: ZETIA Take 1 tablet (10 mg total) by mouth daily.   fluticasone 50 MCG/ACT nasal spray Commonly known as: FLONASE Place 2 sprays into both nostrils daily.   GENTEAL OP Place 1 drop into both eyes daily as needed (dry eyes).   glipiZIDE 10 MG tablet Commonly known as: GLUCOTROL Take 1 tablet (10 mg total) by mouth 2 (two) times daily before a meal.   insulin glargine 100 UNIT/ML injection Commonly known as: LANTUS 52-55 units subq inj. BID   loratadine 10 MG tablet Commonly known as: CLARITIN Take 10 mg by mouth daily as needed for allergies.   metoprolol tartrate 25 MG tablet Commonly known as: LOPRESSOR TAKE 0.5 TABLETS (12.5 MG TOTAL) BY MOUTH 2 (TWO) TIMES DAILY.   nitroGLYCERIN 0.4 MG SL tablet Commonly known as: NITROSTAT PLACE 1 TABLET (0.4 MG TOTAL) UNDER THE TONGUE EVERY 5 (FIVE) MINUTES AS NEEDED FOR CHEST PAIN.   olmesartan 20 MG tablet Commonly known as: BENICAR Take 1 tablet (20 mg  total) by mouth daily.   OneTouch Delica Plus [id number] Misc ONETOUCH VERIO LANCETS USE TO TEST YOUR BLOOD SUGAR TWICE A DAY FINGER STICK   OneTouch Verio test strip Generic drug: glucose blood USE WITH METER TWICE PER DAY DX DM TYPE 2  rosuvastatin 20 MG tablet Commonly known as: CRESTOR Take 1 tablet (20 mg total) by mouth at bedtime.   Rybelsus 3 MG Tabs Generic drug: Semaglutide 1 tab daily for 4 weeks. Started by: Howard Pouch, DO   Rybelsus 7 MG Tabs Generic drug: Semaglutide Take 1 tablet by mouth daily with breakfast. Started by: Howard Pouch, DO       All past medical history, surgical history, allergies, family history, immunizations andmedications were updated in the EMR today and reviewed under the history and medication portions of their EMR.      ROS: 14 pt review of systems performed and negative (unless mentioned in an HPI)  Objective: BP 114/64   Pulse 66   Temp (!) 97.3 F (36.3 C) (Oral)   Ht 5\' 8"  (1.727 m)   Wt 181 lb (82.1 kg)   SpO2 100%   BMI 27.52 kg/m  Gen: Afebrile. No acute distress.  Pleasant, Caucasian male.  Mildly pale appearing today. HENT: AT. [place].  No cough.  No hoarseness. Eyes:Pupils Equal Round Reactive to light, Extraocular movements intact,  Conjunctiva without redness, discharge or icterus. Neck/lymp/endocrine: Supple, no lymphadenopathy, no thyromegaly CV: RRR no murmur, no edema Chest: CTAB, no wheeze or crackles Skin: No rashes, purpura or petechiae.  Neuro:  Normal gait. PERLA. EOMi. Alert. Oriented x3 Psych: Normal affect, dress and demeanor. Normal speech. Normal thought content and judgment.  Diabetic Foot Exam - Simple   Simple Foot Form Diabetic Foot exam was performed with the following findings: Yes 04/29/2020  9:42 AM  Visual Inspection No deformities, no ulcerations, no other skin breakdown bilaterally: Yes Sensation Testing Intact to touch and monofilament testing bilaterally: Yes Pulse Check Posterior  Tibialis and Dorsalis pulse intact bilaterally: Yes Comments Pulses equally bilaterally.  Thickened nails bilaterally.  Mildly cool to touch bilaterally.       Assessment/plan: DEAUNTE DENTE is a 74 y.o. male present for est care Type 2 diabetes mellitus with stage 3 chronic kidney disease, with long-term current use of insulin ([place]) -Patient was encouraged to follow a diabetic diet and make an effort to get routine exercise.  He admits he does not follow a diabetic diet. -Increase Lantus to 52 units at night and 55 units in the day.   - Given his cardiac history and his CKD oral options are limited-insurance does not cover Jardiance either. -Continue glipizide 10/10 mg twice daily before meals today. -Start Rybelsus 3 mg for 1 month and then increase to Rybelsus 7 mg daily - Medicines tried: Lantus and Ozempic (side effects) - He declined nutrition referral PNA series: Pneumonia series completed Flu shot: completed today 04/29/2020 (recommneded yearly) Foot exam: Referred to podiatry 12/2018-completed today Eye exam: 03/2020, Dr. Gwenevere Ghazi changes noted A1c: 8.9>>> 13.7 (08/22/2018)>>11 (01/24/2019)>>10.8 08/20/2019> 10.5>10.3 today  Atrial fibrillation with RVR (HCC)/Essential hypertension/Ischemic heart disease/ S/P CABG x 3/Paroxysmal atrial flutter (HCC)/PAD (peripheral artery disease) (HCC)/Nonsustained ventricular tachycardia (HCC)/HLD/NSTEMI/h/o stroke/PAD - Stable blood pressure.   - Prescribed by cardio: tikosyn, plavix, Eliquis (recently changed from xarelto) - continue Crestor 20 mg daily  - Continue zeita. - continue  Benicar 20 mg daily - continue metoprolol 12.5 mg twice daily  - He has stopped smoking !!!!  Chronic kidney disease (CKD), stage III (moderate) (HCC) Renally dose meds. Avoid NSAIDs. PTH/calcium/vitamin D every 6 months Continue follow up with nephrology.    Influenza immunization provided today  Return in about 3 months (around 07/30/2020) for  CMC (30 min).  Orders Placed This Encounter  Procedures  . Flu Vaccine QUAD High Dose(Fluad)  . Comprehensive metabolic panel  . CBC  . PTH, Intact and Calcium  . Vitamin D (25 hydroxy)  . TSH  . POCT HgB A1C   Meds ordered this encounter  Medications  . Semaglutide (RYBELSUS) 3 MG TABS    Sig: 1 tab daily for 4 weeks.    Dispense:  30 tablet    Refill:  0  . clotrimazole (CLOTRIMAZOLE ANTI-FUNGAL) 1 % cream    Sig: Apply 1 application topically 2 (two) times daily.    Dispense:  30 g    Refill:  5  . rosuvastatin (CRESTOR) 20 MG tablet    Sig: Take 1 tablet (20 mg total) by mouth at bedtime.    Dispense:  90 tablet    Refill:  3  . olmesartan (BENICAR) 20 MG tablet    Sig: Take 1 tablet (20 mg total) by mouth daily.    Dispense:  90 tablet    Refill:  1  . glipiZIDE (GLUCOTROL) 10 MG tablet    Sig: Take 1 tablet (10 mg total) by mouth 2 (two) times daily before a meal.    Dispense:  180 tablet    Refill:  1  . ezetimibe (ZETIA) 10 MG tablet    Sig: Take 1 tablet (10 mg total) by mouth daily.    Dispense:  90 tablet    Refill:  3    MUST HAVE OV FOR FURTHER REFILLS  . insulin glargine (LANTUS) 100 UNIT/ML injection    Sig: 52-55 units subq inj. BID    Dispense:  30 mL    Refill:  5    DC ALL other lantus scripts please. Dose change. thank you.  . Semaglutide (RYBELSUS) 7 MG TABS    Sig: Take 1 tablet by mouth daily with breakfast.    Dispense:  90 tablet    Refill:  1    May fill after 05/24/2020- pt needs to use the low dose 4 week script first. Thanks!   Referral Orders  No referral(s) requested today     Note is dictated utilizing voice recognition software. Although note has been proof read prior to signing, occasional typographical errors still can be missed. If any questions arise, please do not hesitate to call for verification.  Electronically signed by: Howard Pouch, DO [place]

## 2020-04-29 NOTE — Patient Instructions (Addendum)
We have to get your a1c near 7. Today your a1c was 10.3 Increase lantus to 55 untis in the morning and 52 units at night.   Start Rybelus one tab daily. 2nd month will be a higher dose but same medication.  Continue all other medications.   Make sure to follow with your foot doctor.   Diabetes Mellitus and Nutrition, Adult When you have diabetes (diabetes mellitus), it is very important to have healthy eating habits because your blood sugar (glucose) levels are greatly affected by what you eat and drink. Eating healthy foods in the appropriate amounts, at about the same times every day, can help you:  Control your blood glucose.  Lower your risk of heart disease.  Improve your blood pressure.  Reach or maintain a healthy weight. Every person with diabetes is different, and each person has different needs for a meal plan. Your health care provider may recommend that you work with a diet and nutrition specialist (dietitian) to make a meal plan that is best for you. Your meal plan may vary depending on factors such as:  The calories you need.  The medicines you take.  Your weight.  Your blood glucose, blood pressure, and cholesterol levels.  Your activity level.  Other health conditions you have, such as heart or kidney disease. How do carbohydrates affect me? Carbohydrates, also called carbs, affect your blood glucose level more than any other type of food. Eating carbs naturally raises the amount of glucose in your blood. Carb counting is a method for keeping track of how many carbs you eat. Counting carbs is important to keep your blood glucose at a healthy level, especially if you use insulin or take certain oral diabetes medicines. It is important to know how many carbs you can safely have in each meal. This is different for every person. Your dietitian can help you calculate how many carbs you should have at each meal and for each snack. Foods that contain carbs  include:  Bread, cereal, rice, pasta, and crackers.  Potatoes and corn.  Peas, beans, and lentils.  Milk and yogurt.  Fruit and juice.  Desserts, such as cakes, cookies, ice cream, and candy. How does alcohol affect me? Alcohol can cause a sudden decrease in blood glucose (hypoglycemia), especially if you use insulin or take certain oral diabetes medicines. Hypoglycemia can be a life-threatening condition. Symptoms of hypoglycemia (sleepiness, dizziness, and confusion) are similar to symptoms of having too much alcohol. If your health care provider says that alcohol is safe for you, follow these guidelines:  Limit alcohol intake to no more than 1 drink per day for nonpregnant women and 2 drinks per day for men. One drink equals 12 oz of beer, 5 oz of wine, or 1 oz of hard liquor.  Do not drink on an empty stomach.  Keep yourself hydrated with water, diet soda, or unsweetened iced tea.  Keep in mind that regular soda, juice, and other mixers may contain a lot of sugar and must be counted as carbs. What are tips for following this plan?  Reading food labels  Start by checking the serving size on the "Nutrition Facts" label of packaged foods and drinks. The amount of calories, carbs, fats, and other nutrients listed on the label is based on one serving of the item. Many items contain more than one serving per package.  Check the total grams (g) of carbs in one serving. You can calculate the number of servings of carbs in  one serving by dividing the total carbs by 15. For example, if a food has 30 g of total carbs, it would be equal to 2 servings of carbs.  Check the number of grams (g) of saturated and trans fats in one serving. Choose foods that have low or no amount of these fats.  Check the number of milligrams (mg) of salt (sodium) in one serving. Most people should limit total sodium intake to less than 2,300 mg per day.  Always check the nutrition information of foods labeled  as "low-fat" or "nonfat". These foods may be higher in added sugar or refined carbs and should be avoided.  Talk to your dietitian to identify your daily goals for nutrients listed on the label. Shopping  Avoid buying canned, premade, or processed foods. These foods tend to be high in fat, sodium, and added sugar.  Shop around the outside edge of the grocery store. This includes fresh fruits and vegetables, bulk grains, fresh meats, and fresh dairy. Cooking  Use low-heat cooking methods, such as baking, instead of high-heat cooking methods like deep frying.  Cook using healthy oils, such as olive, canola, or sunflower oil.  Avoid cooking with butter, cream, or high-fat meats. Meal planning  Eat meals and snacks regularly, preferably at the same times every day. Avoid going long periods of time without eating.  Eat foods high in fiber, such as fresh fruits, vegetables, beans, and whole grains. Talk to your dietitian about how many servings of carbs you can eat at each meal.  Eat 4-6 ounces (oz) of lean protein each day, such as lean meat, chicken, fish, eggs, or tofu. One oz of lean protein is equal to: ? 1 oz of meat, chicken, or fish. ? 1 egg. ?  cup of tofu.  Eat some foods each day that contain healthy fats, such as avocado, nuts, seeds, and fish. Lifestyle  Check your blood glucose regularly.  Exercise regularly as told by your health care provider. This may include: ? 150 minutes of moderate-intensity or vigorous-intensity exercise each week. This could be brisk walking, biking, or water aerobics. ? Stretching and doing strength exercises, such as yoga or weightlifting, at least 2 times a week.  Take medicines as told by your health care provider.  Do not use any products that contain nicotine or tobacco, such as cigarettes and e-cigarettes. If you need help quitting, ask your health care provider.  Work with a Social worker or diabetes educator to identify strategies to  manage stress and any emotional and social challenges. Questions to ask a health care provider  Do I need to meet with a diabetes educator?  Do I need to meet with a dietitian?  What number can I call if I have questions?  When are the best times to check my blood glucose? Where to find more information:  American Diabetes Association: diabetes.org  Academy of Nutrition and Dietetics: www.eatright.[employer] of Diabetes and Digestive and Kidney Diseases (NIH): [employer] Summary  A healthy meal plan will help you control your blood glucose and maintain a healthy lifestyle.  Working with a diet and nutrition specialist (dietitian) can help you make a meal plan that is best for you.  Keep in mind that carbohydrates (carbs) and alcohol have immediate effects on your blood glucose levels. It is important to count carbs and to use alcohol carefully. This information is not intended to replace advice given to you by your health care provider. Make sure you discuss any  questions you have with your health care provider. Document Revised: 05/20/2017 Document Reviewed: 07/12/2016 Elsevier Patient Education  2020 [employer].

## 2020-04-30 ENCOUNTER — Other Ambulatory Visit: Payer: Self-pay | Admitting: Family

## 2020-04-30 ENCOUNTER — Encounter: Payer: Self-pay | Admitting: Family Medicine

## 2020-04-30 ENCOUNTER — Telehealth: Payer: Self-pay | Admitting: Family Medicine

## 2020-04-30 ENCOUNTER — Encounter: Payer: Self-pay | Admitting: Gastroenterology

## 2020-04-30 ENCOUNTER — Ambulatory Visit (INDEPENDENT_AMBULATORY_CARE_PROVIDER_SITE_OTHER): Payer: Medicare Other

## 2020-04-30 DIAGNOSIS — Z7901 Long term (current) use of anticoagulants: Secondary | ICD-10-CM

## 2020-04-30 DIAGNOSIS — D649 Anemia, unspecified: Secondary | ICD-10-CM

## 2020-04-30 DIAGNOSIS — D510 Vitamin B12 deficiency anemia due to intrinsic factor deficiency: Secondary | ICD-10-CM

## 2020-04-30 LAB — PTH, INTACT AND CALCIUM
Calcium: 9.4 mg/dL (ref 8.6–10.3)
PTH: 39 pg/mL (ref 14–64)

## 2020-04-30 NOTE — Telephone Encounter (Signed)
Please call patient ASAP Liver, kidney and thyroid function are normal electrolytes are normal Glucose/sugar was 206 at the time of his appointment. Vitamin D is low at 24, goal is at least above 30 and 40-50 is best for bone health.  I would encourage him to start an over-the-counter vitamin D 1000 units daily.  Most importantly, his hemoglobin is very low at 8.4 (usually his is around 13.4).  This means he is significantly anemic.  This needs to be addressed immediately.   -Please ask patient if he has noticed any bleeding, such as rectal bleeding or dark tarry stools?   -Please schedule him for lab ASAP to have repeat CBC, iron panel and complete FOBT cards (please make sure he is provided with FOBT packet).   -I have referred him to hematology and gastroenterology urgently.  Hematology will evaluate for need of blood transfusion or iron transfusion and guide him on his blood thinners.  Gastroenterology will need to look for source of bleeding within the stomach/colon. -If he has weakness, shortness of breath, chest pain, dizziness or even feeling just more winded he should report to the emergency room/call EMS immediately-this can mean severe active bleeding is occurring. -If he sees rectal bleeding he should report to the emergency room immediately.  Thanks

## 2020-04-30 NOTE — Telephone Encounter (Signed)
Noted  

## 2020-04-30 NOTE — Telephone Encounter (Signed)
Spoke with pt regarding labs and instructions.  Pt stated he has been experiencing tarry stools. He also mention that he has been experiencing extreme fatigue. Pt was asked he has experienced any weakness, SOB, CP, dizziness or just winded, and pt stated that he has experienced all of the following but just not at the moment. Pt was instructed to call 911 and go to the emergency room. Pt is schedule for lab today and will expect the calls from the specialist.   FYI

## 2020-05-01 ENCOUNTER — Telehealth: Payer: Self-pay

## 2020-05-01 ENCOUNTER — Telehealth: Payer: Self-pay | Admitting: Family

## 2020-05-01 LAB — CBC WITH DIFFERENTIAL/PLATELET
Absolute Monocytes: 656 cells/uL (ref 200–950)
Basophils Absolute: 79 cells/uL (ref 0–200)
Basophils Relative: 1 %
Eosinophils Absolute: 205 cells/uL (ref 15–500)
Eosinophils Relative: 2.6 %
HCT: 28.8 % — ABNORMAL LOW (ref 38.5–50.0)
Hemoglobin: 8.1 g/dL — ABNORMAL LOW (ref 13.2–17.1)
Lymphs Abs: 909 cells/uL (ref 850–3900)
MCH: 19.8 pg — ABNORMAL LOW (ref 27.0–33.0)
MCHC: 28.1 g/dL — ABNORMAL LOW (ref 32.0–36.0)
MCV: 70.2 fL — ABNORMAL LOW (ref 80.0–100.0)
MPV: 10 fL (ref 7.5–12.5)
Monocytes Relative: 8.3 %
Neutro Abs: 6051 cells/uL (ref 1500–7800)
Neutrophils Relative %: 76.6 %
Platelets: 375 10*3/uL (ref 140–400)
RBC: 4.1 10*6/uL — ABNORMAL LOW (ref 4.20–5.80)
RDW: 17.3 % — ABNORMAL HIGH (ref 11.0–15.0)
Total Lymphocyte: 11.5 %
WBC: 7.9 10*3/uL (ref 3.8–10.8)

## 2020-05-01 LAB — IRON,TIBC AND FERRITIN PANEL
%SAT: 3 % (calc) — ABNORMAL LOW (ref 20–48)
Ferritin: 4 ng/mL — ABNORMAL LOW (ref 24–380)
Iron: 16 ug/dL — ABNORMAL LOW (ref 50–180)
TIBC: 487 mcg/dL (calc) — ABNORMAL HIGH (ref 250–425)

## 2020-05-01 NOTE — Telephone Encounter (Signed)
Called pt per new pt ref from PCP and he is aware of his appt on 05/09/20, new pt packt mailed

## 2020-05-01 NOTE — Telephone Encounter (Signed)
New Patient appointment scheduled letter & calendar mailed to patient.   

## 2020-05-02 ENCOUNTER — Encounter: Payer: Self-pay | Admitting: *Deleted

## 2020-05-02 ENCOUNTER — Other Ambulatory Visit: Payer: Self-pay | Admitting: *Deleted

## 2020-05-02 ENCOUNTER — Telehealth: Payer: Self-pay | Admitting: Family Medicine

## 2020-05-02 MED ORDER — FERROUS SULFATE 324 (65 FE) MG PO TBEC: 1.0000 | DELAYED_RELEASE_TABLET | Freq: Three times a day (TID) | ORAL | 1 refills | Status: DC

## 2020-05-02 NOTE — Telephone Encounter (Signed)
Please call patient His iron levels are extremely low.  I have called in an iron supplement to take every 8 hours.  Iron can cause constipation, therefore he should consider taking a stool softener while using the iron.  He also needs to make sure not to take any of his other medicines 2 hours before or 2 hours after he has taken the iron supplement.

## 2020-05-02 NOTE — Patient Instructions (Signed)
Goals Addressed            This Visit's Progress   . [facility]) Learn More About My Health   On track    Follow Up Date 06/19/21   - tell my story and reason for my visit - make a list of questions - ask questions - repeat what I heard to make sure I understand - bring a list of my medicines to the visit - speak up when I don't understand    Why is this important?   The best way to learn about your health and care is by talking to the doctor and nurse.  They will answer your questions and give you information in the way that you like best.    Notes:  05/02/20--awaiting consultation appointments to learn more about new diagnosis of anemia    . [facility]) Make and Keep All Appointments   On track    Follow Up Date 06/19/21   - ask family or friend for a ride - call to cancel if needed - keep a calendar with appointment dates    Why is this important?   Part of staying healthy is seeing the doctor for follow-up care.  If you forget your appointments, there are some things you can do to stay on track.    Notes:  Needs to schedule PCP follow up to discuss medications, concerns, and possible dermatology referral; Also needs to schedule Cardiology appointment to discuss shortness of breath and chest pains  05/02/20-attended appointment with PCP, now has consultation appointments with hematology and gastroenterology    . [facility]) Manage My Medicine   Not on track    Follow Up Date 06/19/20    - call for medicine refill 2 or 3 days before it runs out - keep a list of all the medicines I take; vitamins and herbals too - learn to read medicine labels    Why is this important?   These steps will help you keep on track with your medicines.    Notes: [facility] referral for medication reconciliation and review with patient  05/02/20--has spoken with [facility] for medication review; orders for new oral hypoglycemic (Rybelsus) still awaiting to get from the pharmacy (not in stock in the  pharmacy)    . [facility]) Monitor and Manage My Blood Sugar   On track    Follow Up Date 07/18/20   - check blood sugar at prescribed times - check blood sugar if I feel it is too high or too low - enter blood sugar readings and medication or insulin into daily log - take the blood sugar log to all doctor visits - take the blood sugar meter to all doctor visits    Why is this important?   Checking your blood sugar at home helps to keep it from getting very high or very low.  Writing the results in a diary or log helps the doctor know how to care for you.  Your blood sugar log should have the time, date and the results.  Also, write down the amount of insulin or other medicine that you take.  Other information, like what you ate, exercise done and how you were feeling, will also be helpful.     Notes:     . COMPLETED: [facility]) Obtain Eye Exam   On track    Follow Up Date 07/18/20   - schedule appointment with eye doctor    Why is this important?   Eye check-ups  are important when you have diabetes.  Vision loss can be prevented.    Notes:  05/02/20---Attended eye exam in October 2021 (+ retinopathy per chart review)    . [facility]) Set My Target A1C   Not on track    Follow Up Date 07/18/20   - set target A1C--Target is 7; current is 10.5    Why is this important?   Your target A1C is decided together by you and your doctor.  It is based on several things like your age and other health issues.    Notes:  05/02/20---A1C decreased to 10.3 04/29/20    . [facility]) Stop or Cut Down Tobacco Use   On track    Follow Up Date 07/18/20    - change or avoid triggers like smoky places, drinking alcohol and other smokers Continue to remain quit smoking    Why is this important?   To stop or cut down it is important to have support from a person or group of people who you can count on.  You will also need to think about the things that make you feel like smoking, then plan for how to handle them.     Notes:  05/02/20-remains quit smoking

## 2020-05-02 NOTE — Telephone Encounter (Signed)
Spoke with pt regarding labs and instructions.   

## 2020-05-02 NOTE — Patient Outreach (Signed)
[facility]) Care Management  Opp  05/02/2020   Marvin Salinas [birth year] [id number]   [place] Monthly Outreach  Referral Date:12/03/2019 Referral Source:EMMI Prevent Screening Reason for Referral:Disease Management Education Insurance:[employer] Medicare   Outreach Attempt:  Successful telephone outreach to patient for follow up.  HIPAA verified with patient.  Patient reporting he has new consultation appointments with hematology and gastroenterology for newly found anemia.  Continues to report weakness and tiredness with any activity.  Discussed signs and symptoms of anemia and the differences in blood work being monitored over the past few months.  Fasting blood sugar this morning was 190 with recent fasting ranges of 160-200.  Thinks his blood sugars continue to be elevated due to decrease in activity.  Latest Hgb A1C is 10.3 on 04/29/2020.  Continues to remain quite smoking.  Discussed when to contact and/or seek medical attention based on signs and symptoms.  Encounter Medications:  Outpatient Encounter Medications as of 05/02/2020  Medication Sig Note  . apixaban (ELIQUIS) 5 MG TABS tablet Take 1 tablet (5 mg total) by mouth 2 (two) times daily. Discontinue Xarelto,   . cholecalciferol (VITAMIN D3) 25 MCG (1000 UNIT) tablet Take 1,000 Units by mouth daily.   . clopidogrel (PLAVIX) 75 MG tablet TAKE 1 TABLET BY MOUTH EVERY DAY 03/14/2020: Resumed this morning  . co-enzyme Q-10 30 MG capsule Take 30 mg by mouth daily.   Marland Kitchen dofetilide (TIKOSYN) 125 MCG capsule TAKE 1 CAPSULE BY MOUTH TWICE A DAY   . ezetimibe (ZETIA) 10 MG tablet Take 1 tablet (10 mg total) by mouth daily.   . ferrous sulfate 324 (65 Fe) MG TBEC Take 1 tablet (325 mg total) by mouth in the morning, at noon, and at bedtime.   Marland Kitchen glipiZIDE (GLUCOTROL) 10 MG tablet Take 1 tablet (10 mg total) by mouth 2 (two) times daily before a meal.   . insulin glargine (LANTUS) 100  UNIT/ML injection 52-55 units subq inj. BID   . loratadine (CLARITIN) 10 MG tablet Take 10 mg by mouth daily as needed for allergies.   . metoprolol tartrate (LOPRESSOR) 25 MG tablet TAKE 0.5 TABLETS (12.5 MG TOTAL) BY MOUTH 2 (TWO) TIMES DAILY.   Marland Kitchen olmesartan (BENICAR) 20 MG tablet Take 1 tablet (20 mg total) by mouth daily.   . rosuvastatin (CRESTOR) 20 MG tablet Take 1 tablet (20 mg total) by mouth at bedtime.   . BD INSULIN SYRINGE U/F 31G X 5/16" 0.5 ML MISC Inject 0.52-0.55 mL into the skin 2 (two) times daily as directed. DX E11.22   . Carboxymethylcell-Hypromellose (GENTEAL OP) Place 1 drop into both eyes daily as needed (dry eyes).   . clotrimazole (CLOTRIMAZOLE ANTI-FUNGAL) 1 % cream Apply 1 application topically 2 (two) times daily.   . fluticasone (FLONASE) 50 MCG/ACT nasal spray Place 2 sprays into both nostrils daily.   . Lancets (ONETOUCH DELICA PLUS CVELFY10F) MISC ONETOUCH VERIO LANCETS USE TO TEST YOUR BLOOD SUGAR TWICE A DAY FINGER STICK   . nitroGLYCERIN (NITROSTAT) 0.4 MG SL tablet PLACE 1 TABLET (0.4 MG TOTAL) UNDER THE TONGUE EVERY 5 (FIVE) MINUTES AS NEEDED FOR CHEST PAIN.   Marland Kitchen ONETOUCH VERIO test strip USE WITH METER TWICE PER DAY DX DM TYPE 2   . Semaglutide (RYBELSUS) 3 MG TABS 1 tab daily for 4 weeks. 05/02/2020: Still needs to obtain from pharmacy (pharmacy does not have available at this time, should be in next week)  . Semaglutide (RYBELSUS) 7  MG TABS Take 1 tablet by mouth daily with breakfast.    No facility-administered encounter medications on file as of 05/02/2020.    Functional Status:  No flowsheet data found.  Fall/Depression Screening: Fall Risk  04/29/2020 01/24/2019  Falls in the past year? 0 0  Number falls in past yr: 0 0  Injury with Fall? 0 0  Follow up Falls evaluation completed -   PHQ 2/9 Scores 04/29/2020 02/15/2020 01/24/2019  PHQ - 2 Score 0 0 0  PHQ- 9 Score - - 0    Goals Addressed            This Visit's Progress   . [facility]) Learn  More About My Health   On track    Follow Up Date 06/19/21   - tell my story and reason for my visit - make a list of questions - ask questions - repeat what I heard to make sure I understand - bring a list of my medicines to the visit - speak up when I don't understand    Why is this important?   The best way to learn about your health and care is by talking to the doctor and nurse.  They will answer your questions and give you information in the way that you like best.    Notes:  05/02/20--awaiting consultation appointments to learn more about new diagnosis of anemia    . [facility]) Make and Keep All Appointments   On track    Follow Up Date 06/19/21   - ask family or friend for a ride - call to cancel if needed - keep a calendar with appointment dates    Why is this important?   Part of staying healthy is seeing the doctor for follow-up care.  If you forget your appointments, there are some things you can do to stay on track.    Notes:  Needs to schedule PCP follow up to discuss medications, concerns, and possible dermatology referral; Also needs to schedule Cardiology appointment to discuss shortness of breath and chest pains  05/02/20-attended appointment with PCP, now has consultation appointments with hematology and gastroenterology    . [facility]) Manage My Medicine   Not on track    Follow Up Date 06/19/20    - call for medicine refill 2 or 3 days before it runs out - keep a list of all the medicines I take; vitamins and herbals too - learn to read medicine labels    Why is this important?   These steps will help you keep on track with your medicines.    Notes: [place] referral for medication reconciliation and review with patient  05/02/20--has spoken with [facility] for medication review; orders for new oral hypoglycemic (Rybelsus) still awaiting to get from the pharmacy (not in stock in the pharmacy)    . [facility]) Monitor and Manage My Blood Sugar   On track     Follow Up Date 07/18/20   - check blood sugar at prescribed times - check blood sugar if I feel it is too high or too low - enter blood sugar readings and medication or insulin into daily log - take the blood sugar log to all doctor visits - take the blood sugar meter to all doctor visits    Why is this important?   Checking your blood sugar at home helps to keep it from getting very high or very low.  Writing the results in a diary or log helps the doctor  know how to care for you.  Your blood sugar log should have the time, date and the results.  Also, write down the amount of insulin or other medicine that you take.  Other information, like what you ate, exercise done and how you were feeling, will also be helpful.     Notes:     . COMPLETED: [facility]) Obtain Eye Exam   On track    Follow Up Date 07/18/20   - schedule appointment with eye doctor    Why is this important?   Eye check-ups are important when you have diabetes.  Vision loss can be prevented.    Notes:  05/02/20---Attended eye exam in October 2021 (+ retinopathy per chart review)    . [facility]) Set My Target A1C   Not on track    Follow Up Date 07/18/20   - set target A1C--Target is 7; current is 10.5    Why is this important?   Your target A1C is decided together by you and your doctor.  It is based on several things like your age and other health issues.    Notes:  05/02/20---A1C decreased to 10.3 04/29/20    . [facility]) Stop or Cut Down Tobacco Use   On track    Follow Up Date 07/18/20    - change or avoid triggers like smoky places, drinking alcohol and other smokers Continue to remain quit smoking    Why is this important?   To stop or cut down it is important to have support from a person or group of people who you can count on.  You will also need to think about the things that make you feel like smoking, then plan for how to handle them.    Notes:  05/02/20-remains quit smoking       Appointments:   Attended appointment with primary care provider, Dr. Raoul Pitch on 04/29/2020.  Has consultation appointments with Hematology on 05/09/2020 and Gastroenterology on 05/21/2020.  Plan: RN Health Coach will send primary care provider quarterly update. RN Health Coach will make next telephone outreach to patient within the month of December and patient agrees to future outreach.  [place] [phone] [email or web address]

## 2020-05-05 LAB — POC HEMOCCULT BLD/STL (HOME/3-CARD/SCREEN)
Card #2 Fecal Occult Blod, POC: POSITIVE
Card #3 Fecal Occult Blood, POC: NEGATIVE
Fecal Occult Blood, POC: POSITIVE — AB

## 2020-05-05 NOTE — Addendum Note (Signed)
Addended by: Octaviano Glow on: 05/05/2020 10:07 AM   Modules accepted: Orders

## 2020-05-07 ENCOUNTER — Other Ambulatory Visit (INDEPENDENT_AMBULATORY_CARE_PROVIDER_SITE_OTHER): Payer: Medicare Other

## 2020-05-07 ENCOUNTER — Telehealth: Payer: Self-pay

## 2020-05-07 ENCOUNTER — Encounter: Payer: Self-pay | Admitting: Physician Assistant

## 2020-05-07 ENCOUNTER — Ambulatory Visit: Payer: Medicare Other | Admitting: Physician Assistant

## 2020-05-07 VITALS — BP 130/62 | HR 64 | Ht 68.0 in | Wt 181.0 lb

## 2020-05-07 DIAGNOSIS — K30 Functional dyspepsia: Secondary | ICD-10-CM

## 2020-05-07 DIAGNOSIS — R110 Nausea: Secondary | ICD-10-CM | POA: Diagnosis not present

## 2020-05-07 DIAGNOSIS — D509 Iron deficiency anemia, unspecified: Secondary | ICD-10-CM

## 2020-05-07 DIAGNOSIS — R195 Other fecal abnormalities: Secondary | ICD-10-CM

## 2020-05-07 DIAGNOSIS — R1032 Left lower quadrant pain: Secondary | ICD-10-CM

## 2020-05-07 DIAGNOSIS — Z86010 Personal history of colonic polyps: Secondary | ICD-10-CM

## 2020-05-07 LAB — CBC WITH DIFFERENTIAL/PLATELET
Basophils Absolute: 0.1 10*3/uL (ref 0.0–0.1)
Basophils Relative: 1.1 % (ref 0.0–3.0)
Eosinophils Absolute: 0.4 10*3/uL (ref 0.0–0.7)
Eosinophils Relative: 3.9 % (ref 0.0–5.0)
HCT: 27.2 % — ABNORMAL LOW (ref 39.0–52.0)
Hemoglobin: 8.2 g/dL — ABNORMAL LOW (ref 13.0–17.0)
Lymphocytes Relative: 13.9 % (ref 12.0–46.0)
Lymphs Abs: 1.3 10*3/uL (ref 0.7–4.0)
MCHC: 30.1 g/dL (ref 30.0–36.0)
MCV: 65.3 fl — ABNORMAL LOW (ref 78.0–100.0)
Monocytes Absolute: 0.8 10*3/uL (ref 0.1–1.0)
Monocytes Relative: 8.4 % (ref 3.0–12.0)
Neutro Abs: 6.9 10*3/uL (ref 1.4–7.7)
Neutrophils Relative %: 72.7 % (ref 43.0–77.0)
Platelets: 389 10*3/uL (ref 150.0–400.0)
RBC: 4.17 Mil/uL — ABNORMAL LOW (ref 4.22–5.81)
RDW: 18.5 % — ABNORMAL HIGH (ref 11.5–15.5)
WBC: 9.4 10*3/uL (ref 4.0–10.5)

## 2020-05-07 MED ORDER — PANTOPRAZOLE SODIUM 40 MG PO TBEC: 40.0000 mg | DELAYED_RELEASE_TABLET | Freq: Every morning | ORAL | 6 refills | Status: DC

## 2020-05-07 MED ORDER — NA SULFATE-K SULFATE-MG SULF 17.5-3.13-1.6 GM/177ML PO SOLN: 1.0000 | Freq: Once | ORAL | 0 refills | Status: AC

## 2020-05-07 NOTE — Progress Notes (Addendum)
Subjective:    Patient ID: Marvin Salinas, male    DOB: 07/25/1945, 74 y.o.   MRN: [id number]  HPI Marvin Salinas is a pleasant 75 year old white male, new to GI today referred by Dr. Raoul Pitch for evaluation of Hemoccult positive stool and iron deficiency anemia.  Patient is on Plavix and Eliquis.  He has history of coronary artery disease is status post remote CABG, also with carotid artery disease, prior TIA, history of peripheral arterial disease with lower extremity stenting December 2020.  He has atrial for flutter, last EF 55 to 60%, obstructive sleep apnea, adult onset diabetes mellitus and chronic kidney disease stage III. He relates that he did have 1 prior colonoscopy about 10 years ago done per Dr. Starr Sinclair [facility] and was told that he had polyps.  He was to have 5-year follow-up he believes but did not go through with that. Patient had Hemoccults done on 05/05/2020, 203 were positive, labs from 04/30/2020 hemoglobin 8.1/hematocrit of 28, MCV 70, iron 16/TIBC 487/sat 3/ferritin 4.  Reviewing labs hemoglobin was 13.11-year ago. He says he has been having some problems with frequent nausea over the past year which is sometimes worse if he is exerting himself a lot and sometimes after eating.  He has not been having any vomiting.  Appetite has been okay, weight has been stable.  He does admit to having indigestion fairly frequently occasional heartburn, no dysphagia.  He has also been having some vague mid abdominal discomfort and has noticed some darker stools off and on and more so over the past couple of days. He had recently been started on iron.  When questioned further he says he has been feeling weak and tired recently. He says he is supposed to have an injection in his back earlier next week.  Review of Systems Pertinent positive and negative review of systems were noted in the above HPI section.  All other review of systems was otherwise negative.  Outpatient Encounter Medications as of 05/07/2020   Medication Sig  . apixaban (ELIQUIS) 5 MG TABS tablet Take 1 tablet (5 mg total) by mouth 2 (two) times daily. Discontinue Xarelto,  . BD INSULIN SYRINGE U/F 31G X 5/16" 0.5 ML MISC Inject 0.52-0.55 mL into the skin 2 (two) times daily as directed. DX E11.22  . Carboxymethylcell-Hypromellose (GENTEAL OP) Place 1 drop into both eyes daily as needed (dry eyes).  . cholecalciferol (VITAMIN D3) 25 MCG (1000 UNIT) tablet Take 1,000 Units by mouth daily.  . clopidogrel (PLAVIX) 75 MG tablet TAKE 1 TABLET BY MOUTH EVERY DAY  . clotrimazole (CLOTRIMAZOLE ANTI-FUNGAL) 1 % cream Apply 1 application topically 2 (two) times daily.  Marland Kitchen co-enzyme Q-10 30 MG capsule Take 30 mg by mouth daily.  Marland Kitchen dofetilide (TIKOSYN) 125 MCG capsule TAKE 1 CAPSULE BY MOUTH TWICE A DAY  . ezetimibe (ZETIA) 10 MG tablet Take 1 tablet (10 mg total) by mouth daily.  . ferrous sulfate 324 (65 Fe) MG TBEC Take 1 tablet (325 mg total) by mouth in the morning, at noon, and at bedtime.  . fluticasone (FLONASE) 50 MCG/ACT nasal spray Place 2 sprays into both nostrils daily.  Marland Kitchen glipiZIDE (GLUCOTROL) 10 MG tablet Take 1 tablet (10 mg total) by mouth 2 (two) times daily before a meal.  . insulin glargine (LANTUS) 100 UNIT/ML injection 52-55 units subq inj. BID  . Lancets (ONETOUCH DELICA PLUS [id number]) MISC ONETOUCH VERIO LANCETS USE TO TEST YOUR BLOOD SUGAR TWICE A DAY FINGER STICK  . loratadine (CLARITIN)  10 MG tablet Take 10 mg by mouth daily as needed for allergies.  . metoprolol tartrate (LOPRESSOR) 25 MG tablet TAKE 0.5 TABLETS (12.5 MG TOTAL) BY MOUTH 2 (TWO) TIMES DAILY.  . nitroGLYCERIN (NITROSTAT) 0.4 MG SL tablet PLACE 1 TABLET (0.4 MG TOTAL) UNDER THE TONGUE EVERY 5 (FIVE) MINUTES AS NEEDED FOR CHEST PAIN.  Marland Kitchen olmesartan (BENICAR) 20 MG tablet Take 1 tablet (20 mg total) by mouth daily.  Glory Rosebush VERIO test strip USE WITH METER TWICE PER DAY DX DM TYPE 2  . rosuvastatin (CRESTOR) 20 MG tablet Take 1 tablet (20 mg total) by  mouth at bedtime.  . Semaglutide (RYBELSUS) 3 MG TABS 1 tab daily for 4 weeks.  . Na Sulfate-K Sulfate-Mg Sulf 17.5-3.13-1.6 GM/177ML SOLN Take 1 kit by mouth once for 1 dose. Apply Coupon=BIN: [id number] PCN: CN GROUP: [id number] ID: [id number]  . pantoprazole (PROTONIX) 40 MG tablet Take 1 tablet (40 mg total) by mouth in the morning.  . Semaglutide (RYBELSUS) 7 MG TABS Take 1 tablet by mouth daily with breakfast. (Patient not taking: Reported on 05/07/2020)   No facility-administered encounter medications on file as of 05/07/2020.   Allergies  Allergen Reactions  . Codeine Nausea And Vomiting  . Lisinopril Cough  . Nsaids     CKD  . Latex Hives   Patient Active Problem List   Diagnosis Date Noted  . Lumbar radiculopathy 01/21/2020  . Left hip pain 01/03/2020  . Sacroiliac joint dysfunction of left side 01/03/2020  . Chronic pain of left knee 01/03/2020  . Hypertension   . Ischemic heart disease   . OSA (obstructive sleep apnea) 01/11/2018  . PAD (peripheral artery disease) ([place]) 11/18/2017  . Atrial fibrillation with RVR ([place]) 10/27/2017  . Nonsustained ventricular tachycardia ([place]) 10/13/2017  . Paroxysmal atrial flutter ([place]) 10/13/2017  . Hx of NSTEMI 10/12/2017  . CAD (coronary artery disease) 09/08/2015  . Chronic kidney disease (CKD), stage III (moderate) ([place]) 04/17/2014  . Diabetic neuropathy ([place]) 04/17/2014  . History of stroke 04/17/2014  . Tobacco use disorder 12/06/2013  . Hyperlipidemia 03/15/2011  . Carotid artery disease ([place]) 10/06/2010  . S/P CABG x 3 10/06/2010  . Claudication ([place]) 10/06/2010  . Spinal stenosis 10/06/2010  . Dysuria 10/16/2007  . Type II or unspecified type diabetes mellitus without mention of complication, not stated as uncontrolled 09/18/2007  . Coronary atherosclerosis 09/05/2007  . Enthesopathy of ankle and tarsus 09/04/2007   Social History   Socioeconomic History  . Marital status: Divorced    Spouse name: Not on file  .  Number of children: 0  . Years of education: GED  . Highest education level: Not on file  Occupational History  . Occupation: retired  Tobacco Use  . Smoking status: Former Smoker    Packs/day: 0.25    Years: 54.00    Pack years: 13.50    Types: Cigarettes    Quit date: 03/12/2020    Years since quitting: 0.1  . Smokeless tobacco: Never Used  Vaping Use  . Vaping Use: Never used  Substance and Sexual Activity  . Alcohol use: Yes  . Drug use: No  . Sexual activity: Not Currently    Partners: Female  Other Topics Concern  . Not on file  Social History Narrative   Marital status/children/pets: divorced.   Education/employment: retired, Patient has his GED. 9th grade education.    Safety:      -smoke alarm in the home:Yes     - wears  seatbelt: Yes   Patient is right handed.   Patient does not drink any caffeine.   Social Determinants of Health   Financial Resource Strain: Low Risk   . Difficulty of Paying Living Expenses: Not hard at all  Food Insecurity: No Food Insecurity  . Worried About Charity fundraiser in the Last Year: Never true  . Ran Out of Food in the Last Year: Never true  Transportation Needs: No Transportation Needs  . Lack of Transportation (Medical): No  . Lack of Transportation (Non-Medical): No  Physical Activity:   . Days of Exercise per Week: Not on file  . Minutes of Exercise per Session: Not on file  Stress:   . Feeling of Stress : Not on file  Social Connections:   . Frequency of Communication with Friends and Family: Not on file  . Frequency of Social Gatherings with Friends and Family: Not on file  . Attends Religious Services: Not on file  . Active Member of Clubs or Organizations: Not on file  . Attends Archivist Meetings: Not on file  . Marital Status: Not on file  Intimate Partner Violence: Not At Risk  . Fear of Current or Ex-Partner: No  . Emotionally Abused: No  . Physically Abused: No  . Sexually Abused: No    Mr.  Salinas family history includes Asthma in his brother; Depression in his mother; Diabetes in his brother and sister; Early death in his mother; Heart attack in his father; Heart disease in his father; Hypertension in his father; Kidney disease in his mother; Ovarian cancer in his mother; Stroke in an other family member.      Objective:    Vitals:   05/07/20 1338  BP: 130/62  Pulse: 64    Physical Exam Well-developed well-nourished elderly white male in no acute distress.  Height, Weight, 181 BMI 27.5  HEENT; nontraumatic normocephalic, EOMI, PE RR LA, sclera anicteric. Oropharynx; not examined Neck; supple, no JVD Cardiovascular; regular rate and rhythm with S1-S2, no murmur rub or gallop, sternal incisional scar Pulmonary; Clear bilaterally Abdomen; soft, mild tenderness in the epigastrium and hypogastrium, no guarding nondistended, no palpable mass or hepatosplenomegaly, bowel sounds are active Rectal; not done today-recent Hemoccults positive Skin; benign exam, no jaundice rash or appreciable lesions Extremities; no clubbing cyanosis or edema skin warm and dry Neuro/Psych; alert and oriented x4, grossly nonfocal mood and affect appropriate       Assessment & Plan:   #60 74 year old white male with new finding of iron deficiency anemia and hemoglobin of 8.1 documented on 04/30/2020.  2/3 Hemoccults positive. Patient is chronically anticoagulated with Eliquis and also on Plavix. He has been having some nausea off and on over the past year, intermittent indigestion and vague mid abdominal discomfort.  Stools have been dark intermittently and has noted darker stools over the past few days but has recently been started on iron.  Certainly patient has chronic GI blood loss as etiology for iron deficiency anemia, will need to rule out occult neoplasm, chronic gastropathy, peptic ulcer disease, AVMs  #2 coronary artery disease status post prior CABG 3.  History of ischemic heart  disease most recent EF 55 to 6% 4.  Adult onset diabetes mellitus 5.  Chronic kidney disease stage III 6.  Obstructive sleep apnea 7.  Atrial fib flutter 8.  History of TIA 9.  Peripheral arterial disease with lower extremity stenting December 2020  Plan; repeat CBC today. Start Protonix 40 mg p.o. every  morning. Patient will be scheduled for colonoscopy and EGD with Dr. Bryan Lemma.  Both procedures were discussed in detail with patient including indications risks and benefits and he is agreeable to proceed. We will need to hold Plavix for 5 days prior to procedures, and hold Eliquis for 2 days prior to procedures.  We will communicate with his cardiologist Dr. Acie Fredrickson to assure this is reasonable for this patient. I did have discussion with the patient regarding potential need for hospitalization especially if he has had a drop in his hemoglobin , since checked 1 week ago.  He voices understanding.  Addendum-previous records received, patient had colonoscopy May 2013, Dr. Amedeo Plenty to 3 to 5 mm polyps in the rectosigmoid and transverse removed and one 6 mm polyp in the distal descending colon-path consistent with tubular adenomas x3  Ellis Koffler S [place] PA-C 05/07/2020   Cc: Howard Pouch A, DO

## 2020-05-07 NOTE — Telephone Encounter (Signed)
Patient with diagnosis of afib on Eliquis for anticoagulation.    Procedure: endoscopy/colonoscopy Date of procedure: 05/13/20  CHA2DS2-VASc Score = 7  This indicates a 11.2% annual risk of stroke. The patient's score is based upon: CHF History: 0 HTN History: 1 Diabetes History: 1 Stroke History: 2 (history of TIAs) Vascular Disease History: 1 Age Score: 2 Gender Score: 0  CrCl 61mL/min Platelet count 389K  Due to elevated CV risk including afib and hx of multiple TIAs, would prefer to limit time off of anticoagulation and hold Eliquis for 1 day prior to procedure. Will forward to Dr Acie Fredrickson to see if 2 day hold is ok if MD performing procedure feels it's required.

## 2020-05-07 NOTE — Telephone Encounter (Signed)
I agree that holding eliquis for 1 day would be best Lets see what the GI doctor thinks is best

## 2020-05-07 NOTE — Patient Instructions (Signed)
If you are age 75 or older, your body mass index should be between 23-30. Your Body mass index is 27.52 kg/m. If this is out of the aforementioned range listed, please consider follow up with your Primary Care Provider.  If you are age 41 or younger, your body mass index should be between 19-25. Your Body mass index is 27.52 kg/m. If this is out of the aformentioned range listed, please consider follow up with your Primary Care Provider.   Your provider has requested that you go to the basement level for lab work before leaving today. Press "B" on the elevator. The lab is located at the first door on the left as you exit the elevator.  You have been scheduled for an endoscopy and colonoscopy. Please follow the written instructions given to you at your visit today. Please pick up your prep supplies at the pharmacy within the next 1-3 days. If you use inhalers (even only as needed), please bring them with you on the day of your procedure.  You will be contacted by our office prior to your procedure for directions on holding your Plavix and Eliquis.  If you do not hear from our office 1 week prior to your scheduled procedure, please call [phone] to discuss.   START Pantoprazole 40 mg 1 tablet every morning.  Follow up pending at this time.

## 2020-05-07 NOTE — Telephone Encounter (Signed)
[facility] Pre-operative Risk Assessment     Request for surgical clearance:     Endoscopy Procedure  What type of surgery is being performed?     Endoscopy/Colonoscopy  When is this surgery scheduled?     05/13/20  What type of clearance is required ?   Pharmacy  Are there any medications that need to be held prior to surgery and how long? Eliquis starting 2 days prior and Plavix starting 5 days prior  Practice name and name of physician performing surgery?      [facility]  What is your office phone and fax number?      Phone- [phone]  [phone]  Anesthesia type (None, local, MAC, general) ?       MAC

## 2020-05-07 NOTE — Telephone Encounter (Signed)
   Primary Cardiologist: Mertie Moores, MD  Chart reviewed as part of pre-operative protocol coverage.   Hx of Coronary artery disease-status post CABG-2001,Hypertension, Hyperlipidemia, Diabetes mellitus, Extensive cerebrovascular disease with prior TIAs and known basilar artery stenosis, carotid artery dz, and PAF.  Dr. Acie Fredrickson can pt hold Plavix? Pharmacy to review anticoagulation.   [place], [place] 05/07/2020, 3:31 PM

## 2020-05-08 NOTE — Telephone Encounter (Signed)
Pt may hold Plavix for 5-7 days for procedure Please restart as soon as the GI doctor gives the [place],    Mertie Moores, MD  05/08/2020 3:10 PM    [address],  [place], [place]  [address] Phone: [phone]; Fax: [phone]

## 2020-05-08 NOTE — Telephone Encounter (Signed)
Pt may hold Plavix for 5-7 days for procedure Please restart as soon as the GI doctor gives the [place] PA-C 05/08/2020 3:30 PM

## 2020-05-08 NOTE — Telephone Encounter (Signed)
   Primary Cardiologist: Mertie Moores, MD  Chart reviewed as part of pre-operative protocol coverage. Given past medical history and time since last visit, based on ACC/AHA guidelines, Marvin Salinas would be at acceptable risk for the planned procedure without further cardiovascular testing.   Per Dr Aleen Campi to elevated CV risk including afib and hx of multiple TIAs, would prefer to limit time off of anticoagulation and hold Eliquis for 1 day prior to procedure.  The patient was advised that if he develops new symptoms prior to surgery to contact our office to arrange for a follow-up visit, and he verbalized understanding.  I will route this recommendation to the requesting party via Epic fax function and remove from pre-op pool.  Please call with questions.  Kerin Ransom, PA-C 05/08/2020, 8:47 AM

## 2020-05-08 NOTE — Telephone Encounter (Signed)
I have contacted the patient with instructions on holding Plavix and Eliquis.   Kerin Ransom PA-C 05/08/2020 3:34 PM

## 2020-05-08 NOTE — Telephone Encounter (Signed)
Please advise on the Plavix, as this patient would need to be notified to stop it today.

## 2020-05-09 ENCOUNTER — Other Ambulatory Visit: Payer: Self-pay | Admitting: Family

## 2020-05-09 ENCOUNTER — Inpatient Hospital Stay (HOSPITAL_BASED_OUTPATIENT_CLINIC_OR_DEPARTMENT_OTHER): Payer: Medicare Other | Admitting: Family

## 2020-05-09 ENCOUNTER — Other Ambulatory Visit: Payer: Self-pay

## 2020-05-09 ENCOUNTER — Encounter: Payer: Self-pay | Admitting: Gastroenterology

## 2020-05-09 ENCOUNTER — Telehealth: Payer: Self-pay | Admitting: *Deleted

## 2020-05-09 ENCOUNTER — Telehealth: Payer: Self-pay

## 2020-05-09 ENCOUNTER — Encounter: Payer: Self-pay | Admitting: Family

## 2020-05-09 ENCOUNTER — Inpatient Hospital Stay: Payer: Medicare Other | Attending: Hematology & Oncology

## 2020-05-09 VITALS — BP 133/60 | HR 63 | Temp 97.3°F | Resp 18 | Ht 68.0 in | Wt 180.0 lb

## 2020-05-09 DIAGNOSIS — Z87891 Personal history of nicotine dependence: Secondary | ICD-10-CM | POA: Insufficient documentation

## 2020-05-09 DIAGNOSIS — K922 Gastrointestinal hemorrhage, unspecified: Secondary | ICD-10-CM | POA: Diagnosis not present

## 2020-05-09 DIAGNOSIS — K921 Melena: Secondary | ICD-10-CM | POA: Diagnosis not present

## 2020-05-09 DIAGNOSIS — D500 Iron deficiency anemia secondary to blood loss (chronic): Secondary | ICD-10-CM

## 2020-05-09 DIAGNOSIS — D510 Vitamin B12 deficiency anemia due to intrinsic factor deficiency: Secondary | ICD-10-CM

## 2020-05-09 DIAGNOSIS — D509 Iron deficiency anemia, unspecified: Secondary | ICD-10-CM | POA: Insufficient documentation

## 2020-05-09 DIAGNOSIS — Z8051 Family history of malignant neoplasm of kidney: Secondary | ICD-10-CM

## 2020-05-09 DIAGNOSIS — Z7901 Long term (current) use of anticoagulants: Secondary | ICD-10-CM | POA: Diagnosis not present

## 2020-05-09 DIAGNOSIS — Z8673 Personal history of transient ischemic attack (TIA), and cerebral infarction without residual deficits: Secondary | ICD-10-CM

## 2020-05-09 DIAGNOSIS — G629 Polyneuropathy, unspecified: Secondary | ICD-10-CM | POA: Insufficient documentation

## 2020-05-09 DIAGNOSIS — D649 Anemia, unspecified: Secondary | ICD-10-CM

## 2020-05-09 DIAGNOSIS — Z79899 Other long term (current) drug therapy: Secondary | ICD-10-CM | POA: Insufficient documentation

## 2020-05-09 LAB — SAMPLE TO BLOOD BANK

## 2020-05-09 LAB — CBC WITH DIFFERENTIAL (CANCER CENTER ONLY)
Abs Immature Granulocytes: 0.02 10*3/uL (ref 0.00–0.07)
Basophils Absolute: 0.1 10*3/uL (ref 0.0–0.1)
Basophils Relative: 1 %
Eosinophils Absolute: 0.3 10*3/uL (ref 0.0–0.5)
Eosinophils Relative: 4 %
HCT: 29.7 % — ABNORMAL LOW (ref 39.0–52.0)
Hemoglobin: 8.5 g/dL — ABNORMAL LOW (ref 13.0–17.0)
Immature Granulocytes: 0 %
Lymphocytes Relative: 13 %
Lymphs Abs: 1.1 10*3/uL (ref 0.7–4.0)
MCH: 20.2 pg — ABNORMAL LOW (ref 26.0–34.0)
MCHC: 28.6 g/dL — ABNORMAL LOW (ref 30.0–36.0)
MCV: 70.7 fL — ABNORMAL LOW (ref 80.0–100.0)
Monocytes Absolute: 0.7 10*3/uL (ref 0.1–1.0)
Monocytes Relative: 9 %
Neutro Abs: 6 10*3/uL (ref 1.7–7.7)
Neutrophils Relative %: 73 %
Platelet Count: 388 10*3/uL (ref 150–400)
RBC: 4.2 MIL/uL — ABNORMAL LOW (ref 4.22–5.81)
RDW: 19.9 % — ABNORMAL HIGH (ref 11.5–15.5)
WBC Count: 8.1 10*3/uL (ref 4.0–10.5)
nRBC: 0 % (ref 0.0–0.2)

## 2020-05-09 LAB — CMP (CANCER CENTER ONLY)
ALT: 12 U/L (ref 0–44)
AST: 14 U/L — ABNORMAL LOW (ref 15–41)
Albumin: 4.3 g/dL (ref 3.5–5.0)
Alkaline Phosphatase: 61 U/L (ref 38–126)
Anion gap: 9 (ref 5–15)
BUN: 17 mg/dL (ref 8–23)
CO2: 27 mmol/L (ref 22–32)
Calcium: 9.9 mg/dL (ref 8.9–10.3)
Chloride: 103 mmol/L (ref 98–111)
Creatinine: 1.57 mg/dL — ABNORMAL HIGH (ref 0.61–1.24)
GFR, Estimated: 46 mL/min — ABNORMAL LOW (ref >60)
Glucose, Bld: 235 mg/dL — ABNORMAL HIGH (ref 70–99)
Potassium: 4.2 mmol/L (ref 3.5–5.1)
Sodium: 139 mmol/L (ref 135–145)
Total Bilirubin: 0.3 mg/dL (ref 0.3–1.2)
Total Protein: 6.8 g/dL (ref 6.5–8.1)

## 2020-05-09 LAB — RETICULOCYTES
Immature Retic Fract: 31.7 % — ABNORMAL HIGH (ref 2.3–15.9)
RBC.: 4.12 MIL/uL — ABNORMAL LOW (ref 4.22–5.81)
Retic Count, Absolute: 164.8 10*3/uL (ref 19.0–186.0)
Retic Ct Pct: 4 % — ABNORMAL HIGH (ref 0.4–3.1)

## 2020-05-09 LAB — VITAMIN B12: Vitamin B-12: 218 pg/mL (ref 180–914)

## 2020-05-09 LAB — IRON AND TIBC
Iron: 155 ug/dL (ref 42–163)
Saturation Ratios: 28 % (ref 20–55)
TIBC: 563 ug/dL — ABNORMAL HIGH (ref 202–409)
UIBC: 408 ug/dL — ABNORMAL HIGH (ref 117–376)

## 2020-05-09 LAB — FOLATE: Folate: 20.9 ng/mL (ref >5.9)

## 2020-05-09 LAB — LACTATE DEHYDROGENASE: LDH: 145 U/L (ref 98–192)

## 2020-05-09 LAB — SAVE SMEAR(SSMR), FOR PROVIDER SLIDE REVIEW

## 2020-05-09 LAB — FERRITIN: Ferritin: 20 ng/mL — ABNORMAL LOW (ref 24–336)

## 2020-05-09 NOTE — Telephone Encounter (Signed)
S/w pt per inbasket message and 05/09/20 los, aware of appts and a calendar has been mailed as well... AOM

## 2020-05-09 NOTE — Progress Notes (Signed)
Agree with the assessment and plan as outlined by Nicoletta Ba, PA-C.  Looks like patient has been scheduled with Dr. Ardis Hughs for EGD/colonoscopy on 05/13/2020.  I will forward note for his awareness prior to scheduled procedures.  Maryfrances Portugal, DO, [facility]

## 2020-05-09 NOTE — Telephone Encounter (Signed)
-----   Message from Eliezer Bottom, NP sent at 05/09/2020  1:14 PM EST ----- Scheduling message sent to [facility] for 5 doses of Venofer. He can stop his oral iron supplement.  ----- Message ----- From: Interface, Lab In [place] Sent: 05/09/2020   8:10 AM EST To: Eliezer Bottom, NP

## 2020-05-09 NOTE — Progress Notes (Signed)
Hematology/Oncology Consultation   Name: Marvin Salinas      MRN: [id number]    Location: Room/bed info not found  Date: 05/09/2020 Time:8:20 AM   REFERRING PHYSICIAN: Amy Esterwood, PA-C  REASON FOR CONSULT: Iron deficiency anemia secondary to intermittent GI blood loss   DIAGNOSIS:  Iron deficiency anemia secondary to intermittent GI blood loss  HISTORY OF PRESENT ILLNESS: Marvin Salinas is a very pleasant 74 yo caucasian gentleman with iron deficiency anemia secondary to GI blood loss. Heme positive stool with GI.   He is scheduled to have an endoscopy and colonoscopy with Dr. Ardis Hughs next week on 05/13/2020.  He is on chronic anticoagulation with Eliquis and Plavix for history of TIA, atrial fib/flutter and PAD with stenting.  He has started Protonix PO daily.  He is currently taking an oral iron supplement TID. He states that this sometimes causes nausea as well as abdominal discomfort. Hgb is 8.5, MCV 70, WBC count 8.1 and platelets 388.  He has not noted any obvious blood loss. No bruising or petechiae.  He is symptomatic with fatigue and chewing ice.  He states that his sister also has history of anemia.  No personal history of cancer. His mother passed away in her 61's from kidney cancer.  He states that he is scheduled for lumbar injection due to 3 bulging discs on Monday 05/12/2020. He was given instructions on how to hold Plavix and Eliquis prior to his procedures.  He is diabetic and currently on glipizide, Lantus and Rybelsus.  No fever, chills, n/v, cough, rash, dizziness, SOB, chest pain, palpitations or changes in bowel or bladder habits.  He has neuropathy in his fingertips.  Puffiness in his feet and ankles comes and goes.  No falls or syncopal episodes to report.  He quite smoking 2 months ago. Previously he was smoking about 1 ppd.  No ETOH or recreational drug use.  He has maintained a good appetite and is staying well hydrated. His weight is described as stable.  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   ROS: All other 10 point review of systems is negative.   PAST MEDICAL HISTORY:   Past Medical History:  Diagnosis Date  . Acute kidney injury superimposed on chronic kidney disease ([place]) 10/12/2017  . Atrial fibrillation with RVR ([place]) 10/27/2017  . Back pain   . Basilar artery stenosis    on chronic Plavix  . Benign prostatic hypertrophy without urinary obstruction 04/17/2014  . Carotid artery disease ([place]) 10/06/2010   Carotid US 04/2019: Bilat ICA 40-59; L subclavian stenosis  // Carotid US 11/21: Bilat ICA 40-59; bilateral subclavian stenosis   . Cerebral vascular disease    with prior TIA's; followed by Dr. Erling Cruz  . Depression, neurotic 11/21/2013  . Diabetes mellitus    on insulin  . Enthesopathy of ankle and tarsus 09/04/2007   Overview:  Metatarsalgia   . History of renal calculi   . Hyperlipidemia   . Hypertension   . Ischemic heart disease    prior PCI to RCA in 1989. S/P PCI to LAD and OM in 1992. S/P PCI to first DX in 2000. S/P CABG x 3 in May 2011  . OSA (obstructive sleep apnea) 01/11/2018   AHI 18.1 and SaO2 low 73%  . PAD (peripheral artery disease) ([place]) 11/18/2017   ABIs/Arterial US 04/2019: R 1.21; L 0.66 // R SFA 30-49, stable > 50 CIA and EIA stenosis; L > 50 CIA stenosis (likely represents  severe stenosis or short segment occlusion)  . Peripheral neuropathy   . Pneumonia Sept 2012  . Stroke ([place]) 04/16/2001   small right cerebellar infarct on 04/16/2001 at that time he was found to have proximal left vertebral artery, proximal left common carotid artery and both external carotid artery stenosis as well as intracranial stenosis involving mid basilar artery- 07/2011 add questionable TIA    ALLERGIES: Allergies  Allergen Reactions  . Codeine Nausea And Vomiting  . Lisinopril Cough  . Nsaids     CKD  . Latex Hives      MEDICATIONS:  Current Outpatient Medications on File Prior to Visit  Medication  Sig Dispense Refill  . apixaban (ELIQUIS) 5 MG TABS tablet Take 1 tablet (5 mg total) by mouth 2 (two) times daily. Discontinue Xarelto, 180 tablet 1  . BD INSULIN SYRINGE U/F 31G X 5/16" 0.5 ML MISC Inject 0.52-0.55 mL into the skin 2 (two) times daily as directed. DX E11.22 180 each 1  . Carboxymethylcell-Hypromellose (GENTEAL OP) Place 1 drop into both eyes daily as needed (dry eyes).    . cholecalciferol (VITAMIN D3) 25 MCG (1000 UNIT) tablet Take 1,000 Units by mouth daily.    . clopidogrel (PLAVIX) 75 MG tablet TAKE 1 TABLET BY MOUTH EVERY DAY 90 tablet 3  . clotrimazole (CLOTRIMAZOLE ANTI-FUNGAL) 1 % cream Apply 1 application topically 2 (two) times daily. 30 g 5  . co-enzyme Q-10 30 MG capsule Take 30 mg by mouth daily.    Marland Kitchen dofetilide (TIKOSYN) 125 MCG capsule TAKE 1 CAPSULE BY MOUTH TWICE A DAY 180 capsule 3  . ezetimibe (ZETIA) 10 MG tablet Take 1 tablet (10 mg total) by mouth daily. 90 tablet 3  . ferrous sulfate 324 (65 Fe) MG TBEC Take 1 tablet (325 mg total) by mouth in the morning, at noon, and at bedtime. 90 tablet 1  . fluticasone (FLONASE) 50 MCG/ACT nasal spray Place 2 sprays into both nostrils daily.    Marland Kitchen glipiZIDE (GLUCOTROL) 10 MG tablet Take 1 tablet (10 mg total) by mouth 2 (two) times daily before a meal. 180 tablet 1  . insulin glargine (LANTUS) 100 UNIT/ML injection 52-55 units subq inj. BID 30 mL 5  . Lancets (ONETOUCH DELICA PLUS [id number]) MISC ONETOUCH VERIO LANCETS USE TO TEST YOUR BLOOD SUGAR TWICE A DAY FINGER STICK    . loratadine (CLARITIN) 10 MG tablet Take 10 mg by mouth daily as needed for allergies.    . metoprolol tartrate (LOPRESSOR) 25 MG tablet TAKE 0.5 TABLETS (12.5 MG TOTAL) BY MOUTH 2 (TWO) TIMES DAILY. 90 tablet 2  . nitroGLYCERIN (NITROSTAT) 0.4 MG SL tablet PLACE 1 TABLET (0.4 MG TOTAL) UNDER THE TONGUE EVERY 5 (FIVE) MINUTES AS NEEDED FOR CHEST PAIN. 25 tablet 12  . olmesartan (BENICAR) 20 MG tablet Take 1 tablet (20 mg total) by mouth daily. 90  tablet 1  . ONETOUCH VERIO test strip USE WITH METER TWICE PER DAY DX DM TYPE 2    . pantoprazole (PROTONIX) 40 MG tablet Take 1 tablet (40 mg total) by mouth in the morning. 30 tablet 6  . rosuvastatin (CRESTOR) 20 MG tablet Take 1 tablet (20 mg total) by mouth at bedtime. 90 tablet 3  . Semaglutide (RYBELSUS) 3 MG TABS 1 tab daily for 4 weeks. 30 tablet 0  . Semaglutide (RYBELSUS) 7 MG TABS Take 1 tablet by mouth daily with breakfast. (Patient not taking: Reported on 05/07/2020) 90 tablet 1   No current facility-administered medications  on file prior to visit.     PAST SURGICAL HISTORY Past Surgical History:  Procedure Laterality Date  .  NASAL ENDOSCOPY  01/11/2020   chronic rhinitis w/o evidence of acute sinusitis, bilateral inferior turbinate hypertrophy  . ABDOMINAL AORTOGRAM W/LOWER EXTREMITY Bilateral 05/30/2019   Procedure: ABDOMINAL AORTOGRAM W/LOWER EXTREMITY;  Surgeon: Wellington Hampshire, MD;  Location: [facility];  Service: Cardiovascular;  Laterality: Bilateral;  . ANGIOPLASTY  1989   right coronary artery  . ANGIOPLASTY  1992   LAD and OM  . ANGIOPLASTY  1998   First DX  . BRAIN SURGERY     on prior records  . CORONARY ARTERY BYPASS GRAFT  11/12/2009   LIMA to LAD, SVG to OM and SVG to RCA  . CORONARY STENT PLACEMENT  2000   Stent to LAD/Circumflex with angioplasty to first diagonal   . LEFT HEART CATH AND CORS/GRAFTS ANGIOGRAPHY N/A 10/13/2017   Procedure: LEFT HEART CATH AND CORS/GRAFTS ANGIOGRAPHY;  Surgeon: Nelva Bush, MD;  Location: [facility];  Service: Cardiovascular;  Laterality: N/A;    FAMILY HISTORY: Family History  Problem Relation Age of Onset  . Depression Mother   . Early death Mother   . Kidney disease Mother   . Ovarian cancer Mother   . Hypertension Father   . Heart disease Father   . Heart attack Father   . Asthma Brother   . Diabetes Brother   . Stroke Other        Uncle  . Diabetes Sister     SOCIAL HISTORY:   reports that he quit smoking about 8 weeks ago. His smoking use included cigarettes. He has a 13.50 pack-year smoking history. He has never used smokeless tobacco. He reports current alcohol use. He reports that he does not use drugs.  PERFORMANCE STATUS: The patient's performance status is 1 - Symptomatic but completely ambulatory  PHYSICAL EXAM: Most Recent Vital Signs: There were no vitals taken for this visit. BP 133/60 (BP Location: Left Arm, Patient Position: Sitting)   Pulse 63   Temp (!) 97.3 F (36.3 C) (Oral)   Resp 18   Ht 5\' 8"  (1.727 m)   Wt 180 lb (81.6 kg)   SpO2 100%   BMI 27.37 kg/m   General Appearance:    Alert, cooperative, no distress, appears stated age  Head:    Normocephalic, without obvious abnormality, atraumatic  Eyes:    PERRL, conjunctiva/corneas clear, EOM's intact, fundi    benign, both eyes             Throat:   Lips, mucosa, and tongue normal; teeth and gums normal  Neck:   Supple, symmetrical, trachea midline, no adenopathy;       thyroid:  No enlargement/tenderness/nodules; no carotid   bruit or JVD  Back:     Symmetric, no curvature, ROM normal, no CVA tenderness  Lungs:     Clear to auscultation bilaterally, respirations unlabored  Chest wall:    No tenderness or deformity  Heart:    Regular rate and rhythm, S1 and S2 normal, no murmur, rub   or gallop  Abdomen:     Soft, non-tender, bowel sounds active all four quadrants,    no masses, no organomegaly        Extremities:   Extremities normal, atraumatic, no cyanosis or edema  Pulses:   2+ and symmetric all extremities  Skin:   Skin color, texture, turgor normal, no rashes or lesions  Lymph nodes:   Cervical, supraclavicular, and axillary nodes normal  Neurologic:   CNII-XII intact. Normal strength, sensation and reflexes      throughout    LABORATORY DATA:  Results for orders placed or performed in visit on 05/09/20 (from the past 48 hour(s))  CBC with Differential ([facility]  Only)     Status: Abnormal   Collection Time: 05/09/20  7:55 AM  Result Value Ref Range   WBC Count 8.1 4.0 - 10.5 K/uL   RBC 4.20 (L) 4.22 - 5.81 MIL/uL   Hemoglobin 8.5 (L) 13.0 - 17.0 g/dL    Comment: Reticulocyte Hemoglobin testing may be clinically indicated, consider ordering this additional test [id number]    HCT 29.7 (L) 39 - 52 %   MCV 70.7 (L) 80.0 - 100.0 fL   MCH 20.2 (L) 26.0 - 34.0 pg   MCHC 28.6 (L) 30.0 - 36.0 g/dL   RDW 19.9 (H) 11.5 - 15.5 %   Platelet Count 388 150 - 400 K/uL   nRBC 0.0 0.0 - 0.2 %   Neutrophils Relative % 73 %   Neutro Abs 6.0 1.7 - 7.7 K/uL   Lymphocytes Relative 13 %   Lymphs Abs 1.1 0.7 - 4.0 K/uL   Monocytes Relative 9 %   Monocytes Absolute 0.7 0.1 - 1.0 K/uL   Eosinophils Relative 4 %   Eosinophils Absolute 0.3 0.0 - 0.5 K/uL   Basophils Relative 1 %   Basophils Absolute 0.1 0.0 - 0.1 K/uL   Immature Granulocytes 0 %   Abs Immature Granulocytes 0.02 0.00 - 0.07 K/uL    Comment: Performed at [facility] at [facility], [address] [place], [place] [address]  Reticulocytes     Status: Abnormal   Collection Time: 05/09/20  7:55 AM  Result Value Ref Range   Retic Ct Pct 4.0 (H) 0.4 - 3.1 %   RBC. 4.12 (L) 4.22 - 5.81 MIL/uL   Retic Count, Absolute 164.8 19.0 - 186.0 K/uL   Immature Retic Fract 31.7 (H) 2.3 - 15.9 %    Comment: Performed at [facility] at [facility], [address] [place], [place] [address]  Save Smear [facility])     Status: None   Collection Time: 05/09/20  7:55 AM  Result Value Ref Range   Smear Review SMEAR STAINED AND AVAILABLE FOR REVIEW     Comment: Performed at [facility] at [facility], [address], [place], [place] [address]      RADIOGRAPHY: No results found.     PATHOLOGY: None  ASSESSMENT/PLAN: Mr. Montenegro is a very pleasant 74 yo caucasian gentleman with iron deficiency anemia secondary to GI blood loss.  He is  symptomatic as mentioned above.  Anemia panel is pending. We will get him set up for IV iron replacement if needed and have him stop his oral iron supplement.  Follow-up in 6 weeks.   All questions were answered and they are in agreement with the plan. He was encouraged to contact our office with any questions or concerns. We can certainly see him sooner if needed.   Laverna Peace, NP

## 2020-05-09 NOTE — Telephone Encounter (Signed)
As noted below by Laverna Peace, NP, I instructed the patient to stop his oral iron supplement due to him receiving IV iron. Reviewed his schedule and appointments. He verbalized understanding.

## 2020-05-12 DIAGNOSIS — M5416 Radiculopathy, lumbar region: Secondary | ICD-10-CM | POA: Diagnosis not present

## 2020-05-12 LAB — ERYTHROPOIETIN: Erythropoietin: 71.3 m[IU]/mL — ABNORMAL HIGH (ref 2.6–18.5)

## 2020-05-13 ENCOUNTER — Other Ambulatory Visit: Payer: Self-pay

## 2020-05-13 ENCOUNTER — Other Ambulatory Visit: Payer: Self-pay | Admitting: Family

## 2020-05-13 ENCOUNTER — Ambulatory Visit (AMBULATORY_SURGERY_CENTER): Payer: Medicare Other | Admitting: Gastroenterology

## 2020-05-13 ENCOUNTER — Encounter: Payer: Self-pay | Admitting: Gastroenterology

## 2020-05-13 ENCOUNTER — Other Ambulatory Visit: Payer: Self-pay | Admitting: Gastroenterology

## 2020-05-13 VITALS — BP 128/68 | HR 60 | Temp 97.7°F | Resp 60 | Ht 68.0 in | Wt 181.0 lb

## 2020-05-13 DIAGNOSIS — R195 Other fecal abnormalities: Secondary | ICD-10-CM | POA: Diagnosis not present

## 2020-05-13 DIAGNOSIS — D123 Benign neoplasm of transverse colon: Secondary | ICD-10-CM | POA: Diagnosis not present

## 2020-05-13 DIAGNOSIS — D124 Benign neoplasm of descending colon: Secondary | ICD-10-CM

## 2020-05-13 DIAGNOSIS — D509 Iron deficiency anemia, unspecified: Secondary | ICD-10-CM | POA: Diagnosis not present

## 2020-05-13 DIAGNOSIS — K648 Other hemorrhoids: Secondary | ICD-10-CM | POA: Diagnosis not present

## 2020-05-13 DIAGNOSIS — K2970 Gastritis, unspecified, without bleeding: Secondary | ICD-10-CM | POA: Diagnosis not present

## 2020-05-13 MED ORDER — SODIUM CHLORIDE 0.9 % IV SOLN: 500.0000 mL | Freq: Once | INTRAVENOUS | Status: DC

## 2020-05-13 NOTE — Progress Notes (Signed)
PT taken to PACU. Monitors in place. VSS. Report given to RN. 

## 2020-05-13 NOTE — Progress Notes (Signed)
Called to room to assist during endoscopic procedure.  Patient ID and intended procedure confirmed with present staff. Received instructions for my participation in the procedure from the performing physician.  

## 2020-05-13 NOTE — Progress Notes (Signed)
Pt's blood sugar was 297 in the recovery room.  Per Dr. Ardis Hughs take your diabetes medication when pt gets home and continue to monitor his blood sugar.  Pt was also instruction to [place] tomorrow.  Pt was also told verbally by Dr. Ardis Hughs to restart his iron today.  No problems noted in the recovery room. maw

## 2020-05-13 NOTE — Op Note (Signed)
[facility] Patient Name: Marvin Salinas Procedure Date: 05/13/2020 3:18 PM MRN: [id number] Endoscopist: Milus Banister , MD Age: 74 Referring MD:  Date of Birth: 10-26-1945 Gender: Male Account #: [id number] Procedure:                Upper GI endoscopy Indications:              Iron deficiency anemia Medicines:                Monitored Anesthesia Care Procedure:                Pre-Anesthesia Assessment:                           - Prior to the procedure, a History and Physical                            was performed, and patient medications and                            allergies were reviewed. The patient's tolerance of                            previous anesthesia was also reviewed. The risks                            and benefits of the procedure and the sedation                            options and risks were discussed with the patient.                            All questions were answered, and informed consent                            was obtained. Prior Anticoagulants: The patient has                            taken Plavix (clopidogrel), last dose was 5 days                            prior to procedure and eliquis, last dose 2 days                            ago. ASA Grade Assessment: III - A patient with                            severe systemic disease. After reviewing the risks                            and benefits, the patient was deemed in                            satisfactory condition to undergo the procedure.  After obtaining informed consent, the endoscope was                            passed under direct vision. Throughout the                            procedure, the patient's blood pressure, pulse, and                            oxygen saturations were monitored continuously. The                            Endoscope was introduced through the mouth, and                            advanced to the second part of  duodenum. The upper                            GI endoscopy was accomplished without difficulty.                            The patient tolerated the procedure well. Scope In: Scope Out: Findings:                 Very mild, non-specific gastritis.                           Normal duodenum, biopsied to check for Celiac Sprue                           The exam was otherwise without abnormality. Complications:            No immediate complications. Estimated blood loss:                            None. Estimated Blood Loss:     Estimated blood loss: none. Impression:               - Very mild, non-specific gastritis.                           - Normal duodenum, biopsied to check for Celiac                            Sprue                           - The examination was otherwise normal. Recommendation:           - Patient has a contact number available for                            emergencies. The signs and symptoms of potential                            delayed complications were discussed with the  patient. Return to normal activities tomorrow.                            Written discharge instructions were provided to the                            patient.                           - Resume previous diet.                           - Continue present medications. It is OK to resume                            BOTH of your blood thinners tomorrow.                           - Await pathology results. Milus Banister, MD 05/13/2020 3:59:52 PM This report has been signed electronically.

## 2020-05-13 NOTE — Progress Notes (Signed)
previsit done ; pt states no changes to health hx.  Vs by CW

## 2020-05-13 NOTE — Op Note (Signed)
[facility] Patient Name: Marvin Salinas Procedure Date: 05/13/2020 3:19 PM MRN: [id number] Endoscopist: Milus Banister , MD Age: 74 Referring MD:  Date of Birth: [birth year] Gender: Male Account #: [id number] Procedure:                Colonoscopy Indications:              Iron deficiency anemia, heme + stool Medicines:                Monitored Anesthesia Care Procedure:                Pre-Anesthesia Assessment:                           - Prior to the procedure, a History and Physical                            was performed, and patient medications and                            allergies were reviewed. The patient's tolerance of                            previous anesthesia was also reviewed. The risks                            and benefits of the procedure and the sedation                            options and risks were discussed with the patient.                            All questions were answered, and informed consent                            was obtained. Prior Anticoagulants: The patient has                            taken Plavix (clopidogrel), last dose was 5 days                            prior to procedure, and eliquis last dose 2 days                            ago. ASA Grade Assessment: III - A patient with                            severe systemic disease. After reviewing the risks                            and benefits, the patient was deemed in                            satisfactory condition to undergo the procedure.  After obtaining informed consent, the colonoscope                            was passed under direct vision. Throughout the                            procedure, the patient's blood pressure, pulse, and                            oxygen saturations were monitored continuously. The                            Colonoscope was introduced through the anus and                            advanced to the the cecum,  identified by                            appendiceal orifice and ileocecal valve. The                            colonoscopy was performed without difficulty. The                            patient tolerated the procedure well. The quality                            of the bowel preparation was good. The ileocecal                            valve, appendiceal orifice, and rectum were                            photographed. Scope In: 3:23:25 PM Scope Out: 3:39:54 PM Scope Withdrawal Time: 0 hours 12 minutes 12 seconds  Total Procedure Duration: 0 hours 16 minutes 29 seconds  Findings:                 Five sessile polyps were found in the descending                            colon and transverse colon. The polyps were 4 to 10                            mm in size. These polyps were removed with a cold                            snare. Resection and retrieval were complete.                           Small internal and external hemorrhoids.                           The exam was otherwise without abnormality on  direct and retroflexion views. Complications:            No immediate complications. Estimated blood loss:                            None. Estimated Blood Loss:     Estimated blood loss: none. Impression:               - Five 4 to 10 mm polyps in the descending colon                            and in the transverse colon, removed with a cold                            snare. Resected and retrieved.                           - The examination was otherwise normal on direct                            and retroflexion views. Recommendation:           - EGD now.                           - Await pathology results. Milus Banister, MD 05/13/2020 3:55:17 PM This report has been signed electronically.

## 2020-05-13 NOTE — Patient Instructions (Signed)
Handouts were given to you on polyps,hemorrhoids, and gastritis. Per Dr. Ardis Hughs you may resume your PLAVIX & ELIQUIS TOMORROW. Your blood sugar was 297 in the recovery room.  Pt instructed to take his diabetes medication when to gets home and continue to monitor your blood sugar. You may resume your current medications today including your IRON.   Await biopsy results. Please call if any questions or concerns.    YOU HAD AN ENDOSCOPIC PROCEDURE TODAY AT [facility]:   Refer to the procedure report that was given to you for any specific questions about what was found during the examination.  If the procedure report does not answer your questions, please call your gastroenterologist to clarify.  If you requested that your care partner not be given the details of your procedure findings, then the procedure report has been included in a sealed envelope for you to review at your convenience later.  YOU SHOULD EXPECT: Some feelings of bloating in the abdomen. Passage of more gas than usual.  Walking can help get rid of the air that was put into your GI tract during the procedure and reduce the bloating. If you had a lower endoscopy (such as a colonoscopy or flexible sigmoidoscopy) you may notice spotting of blood in your stool or on the toilet paper. If you underwent a bowel prep for your procedure, you may not have a normal bowel movement for a few days.  Please Note:  You might notice some irritation and congestion in your nose or some drainage.  This is from the oxygen used during your procedure.  There is no need for concern and it should clear up in a day or so.  SYMPTOMS TO REPORT IMMEDIATELY:   Following lower endoscopy (colonoscopy or flexible sigmoidoscopy):  Excessive amounts of blood in the stool  Significant tenderness or worsening of abdominal pains  Swelling of the abdomen that is new, acute  Fever of 100F or higher   Following upper endoscopy (EGD)  Vomiting of  blood or coffee ground material  New chest pain or pain under the shoulder blades  Painful or persistently difficult swallowing  New shortness of breath  Fever of 100F or higher  Black, tarry-looking stools  For urgent or emergent issues, a gastroenterologist can be reached at any hour by calling [phone]. Do not use MyChart messaging for urgent concerns.    DIET:  We do recommend a small meal at first, but then you may proceed to your regular diet.  Drink plenty of fluids but you should avoid alcoholic beverages for 24 hours.  ACTIVITY:  You should plan to take it easy for the rest of today and you should NOT DRIVE or use heavy machinery until tomorrow (because of the sedation medicines used during the test).    FOLLOW UP: Our staff will call the number listed on your records 48-72 hours following your procedure to check on you and address any questions or concerns that you may have regarding the information given to you following your procedure. If we do not reach you, we will leave a message.  We will attempt to reach you two times.  During this call, we will ask if you have developed any symptoms of COVID 19. If you develop any symptoms (ie: fever, flu-like symptoms, shortness of breath, cough etc.) before then, please call [phone].  If you test positive for Covid 19 in the 2 weeks post procedure, please call and report this information to [place].  If any biopsies were taken you will be contacted by phone or by letter within the next 1-3 weeks.  Please call us at [phone] if you have not heard about the biopsies in 3 weeks.    SIGNATURES/CONFIDENTIALITY: You and/or your care partner have signed paperwork which will be entered into your electronic medical record.  These signatures attest to the fact that that the information above on your After Visit Summary has been reviewed and is understood.  Full responsibility of the confidentiality of this discharge information lies  with you and/or your care-partner.

## 2020-05-14 ENCOUNTER — Telehealth: Payer: Self-pay

## 2020-05-14 NOTE — Telephone Encounter (Signed)
Follow up call to pt, lm on vm for pt to call with any problems or questions.

## 2020-05-19 ENCOUNTER — Inpatient Hospital Stay: Payer: Medicare Other

## 2020-05-19 ENCOUNTER — Other Ambulatory Visit: Payer: Self-pay

## 2020-05-19 VITALS — BP 160/56 | HR 57 | Temp 97.6°F | Resp 18

## 2020-05-19 DIAGNOSIS — G629 Polyneuropathy, unspecified: Secondary | ICD-10-CM | POA: Diagnosis not present

## 2020-05-19 DIAGNOSIS — K922 Gastrointestinal hemorrhage, unspecified: Secondary | ICD-10-CM | POA: Diagnosis not present

## 2020-05-19 DIAGNOSIS — D500 Iron deficiency anemia secondary to blood loss (chronic): Secondary | ICD-10-CM

## 2020-05-19 DIAGNOSIS — Z7901 Long term (current) use of anticoagulants: Secondary | ICD-10-CM | POA: Diagnosis not present

## 2020-05-19 DIAGNOSIS — K921 Melena: Secondary | ICD-10-CM | POA: Diagnosis not present

## 2020-05-19 DIAGNOSIS — Z8673 Personal history of transient ischemic attack (TIA), and cerebral infarction without residual deficits: Secondary | ICD-10-CM | POA: Diagnosis not present

## 2020-05-19 DIAGNOSIS — Z87891 Personal history of nicotine dependence: Secondary | ICD-10-CM | POA: Diagnosis not present

## 2020-05-19 DIAGNOSIS — Z79899 Other long term (current) drug therapy: Secondary | ICD-10-CM | POA: Diagnosis not present

## 2020-05-19 MED ORDER — SODIUM CHLORIDE 0.9 % IV SOLN
Freq: Once | INTRAVENOUS | Status: AC
  Filled 2020-05-19: qty 250

## 2020-05-19 MED ORDER — SODIUM CHLORIDE 0.9 % IV SOLN
200.0000 mg | Freq: Once | INTRAVENOUS | Status: AC
  Administered 2020-05-19: 200 mg via INTRAVENOUS
  Filled 2020-05-19: qty 200

## 2020-05-19 NOTE — Patient Instructions (Addendum)
Patient instructed to discontinue PO iron at home per last visit note.   Iron Sucrose injection What is this medicine? IRON SUCROSE (AHY ern SOO krohs) is an iron complex. Iron is used to make healthy red blood cells, which carry oxygen and nutrients throughout the body. This medicine is used to treat iron deficiency anemia in people with chronic kidney disease. This medicine may be used for other purposes; ask your health care provider or pharmacist if you have questions. COMMON BRAND NAME(S): Venofer What should I tell my health care provider before I take this medicine? They need to know if you have any of these conditions:  anemia not caused by low iron levels  heart disease  high levels of iron in the blood  kidney disease  liver disease  an unusual or allergic reaction to iron, other medicines, foods, dyes, or preservatives  pregnant or trying to get pregnant  breast-feeding How should I use this medicine? This medicine is for infusion into a vein. It is given by a health care professional in a hospital or clinic setting. Talk to your pediatrician regarding the use of this medicine in children. While this drug may be prescribed for children as young as 2 years for selected conditions, precautions do apply. Overdosage: If you think you have taken too much of this medicine contact a poison control center or emergency room at once. NOTE: This medicine is only for you. Do not share this medicine with others. What if I miss a dose? It is important not to miss your dose. Call your doctor or health care professional if you are unable to keep an appointment. What may interact with this medicine? Do not take this medicine with any of the following medications:  deferoxamine  dimercaprol  other iron products This medicine may also interact with the following medications:  chloramphenicol  deferasirox This list may not describe all possible interactions. Give your health care  provider a list of all the medicines, herbs, non-prescription drugs, or dietary supplements you use. Also tell them if you smoke, drink alcohol, or use illegal drugs. Some items may interact with your medicine. What should I watch for while using this medicine? Visit your doctor or healthcare professional regularly. Tell your doctor or healthcare professional if your symptoms do not start to get better or if they get worse. You may need blood work done while you are taking this medicine. You may need to follow a special diet. Talk to your doctor. Foods that contain iron include: whole grains/cereals, dried fruits, beans, or peas, leafy green vegetables, and organ meats (liver, kidney). What side effects may I notice from receiving this medicine? Side effects that you should report to your doctor or health care professional as soon as possible:  allergic reactions like skin rash, itching or hives, swelling of the face, lips, or tongue  breathing problems  changes in blood pressure  cough  fast, irregular heartbeat  feeling faint or lightheaded, falls  fever or chills  flushing, sweating, or hot feelings  joint or muscle aches/pains  seizures  swelling of the ankles or feet  unusually weak or tired Side effects that usually do not require medical attention (report to your doctor or health care professional if they continue or are bothersome):  diarrhea  feeling achy  headache  irritation at site where injected  nausea, vomiting  stomach upset  tiredness This list may not describe all possible side effects. Call your doctor for medical advice about side  effects. You may report side effects to FDA at 1-800-FDA-1088. Where should I keep my medicine? This drug is given in a hospital or clinic and will not be stored at home. NOTE: This sheet is a summary. It may not cover all possible information. If you have questions about this medicine, talk to your doctor, pharmacist, or  health care provider.  2020 Elsevier/Gold Standard (2011-03-18 17:14:35) Pt discharged in no apparent distress. Pt left ambulatory without assistance. Pt aware of discharge instructions and verbalized understanding and had no further questions.

## 2020-05-21 ENCOUNTER — Ambulatory Visit: Payer: Medicare Other | Admitting: Gastroenterology

## 2020-05-22 ENCOUNTER — Other Ambulatory Visit: Payer: Self-pay

## 2020-05-22 ENCOUNTER — Inpatient Hospital Stay: Payer: Medicare Other | Attending: Hematology & Oncology

## 2020-05-22 VITALS — BP 159/60 | HR 78 | Temp 97.8°F | Resp 18

## 2020-05-22 DIAGNOSIS — D500 Iron deficiency anemia secondary to blood loss (chronic): Secondary | ICD-10-CM | POA: Diagnosis not present

## 2020-05-22 DIAGNOSIS — K922 Gastrointestinal hemorrhage, unspecified: Secondary | ICD-10-CM | POA: Diagnosis not present

## 2020-05-22 MED ORDER — ACETAMINOPHEN 325 MG PO TABS
ORAL_TABLET | ORAL | Status: AC
  Filled 2020-05-22: qty 2

## 2020-05-22 MED ORDER — SODIUM CHLORIDE 0.9 % IV SOLN
200.0000 mg | Freq: Once | INTRAVENOUS | Status: AC
  Administered 2020-05-22: 200 mg via INTRAVENOUS
  Filled 2020-05-22: qty 200

## 2020-05-22 MED ORDER — DIPHENHYDRAMINE HCL 25 MG PO CAPS
ORAL_CAPSULE | ORAL | Status: AC
  Filled 2020-05-22: qty 1

## 2020-05-22 MED ORDER — ACETAMINOPHEN 325 MG PO TABS
650.0000 mg | ORAL_TABLET | Freq: Once | ORAL | Status: AC
  Administered 2020-05-22: 650 mg via ORAL

## 2020-05-22 MED ORDER — SODIUM CHLORIDE 0.9 % IV SOLN
Freq: Once | INTRAVENOUS | Status: AC
  Filled 2020-05-22: qty 250

## 2020-05-22 MED ORDER — DIPHENHYDRAMINE HCL 25 MG PO CAPS
25.0000 mg | ORAL_CAPSULE | Freq: Once | ORAL | Status: AC
  Administered 2020-05-22: 25 mg via ORAL

## 2020-05-22 NOTE — Patient Instructions (Signed)

## 2020-05-22 NOTE — Progress Notes (Signed)
Pt discharged in no apparent distress. Pt left ambulatory without assistance. Pt aware of discharge instructions and verbalized understanding and had no further questions.  

## 2020-05-23 ENCOUNTER — Other Ambulatory Visit: Payer: Self-pay

## 2020-05-23 DIAGNOSIS — D509 Iron deficiency anemia, unspecified: Secondary | ICD-10-CM

## 2020-05-24 ENCOUNTER — Other Ambulatory Visit: Payer: Self-pay | Admitting: Family Medicine

## 2020-05-25 ENCOUNTER — Other Ambulatory Visit: Payer: Self-pay | Admitting: Family Medicine

## 2020-05-26 ENCOUNTER — Inpatient Hospital Stay: Payer: Medicare Other

## 2020-05-26 ENCOUNTER — Other Ambulatory Visit: Payer: Self-pay

## 2020-05-26 VITALS — BP 153/57 | HR 63 | Temp 98.0°F | Resp 18

## 2020-05-26 DIAGNOSIS — D500 Iron deficiency anemia secondary to blood loss (chronic): Secondary | ICD-10-CM | POA: Diagnosis not present

## 2020-05-26 DIAGNOSIS — K922 Gastrointestinal hemorrhage, unspecified: Secondary | ICD-10-CM | POA: Diagnosis not present

## 2020-05-26 MED ORDER — ACETAMINOPHEN 325 MG PO TABS
650.0000 mg | ORAL_TABLET | Freq: Once | ORAL | Status: AC
  Administered 2020-05-26: 650 mg via ORAL

## 2020-05-26 MED ORDER — ACETAMINOPHEN 325 MG PO TABS
ORAL_TABLET | ORAL | Status: AC
  Filled 2020-05-26: qty 2

## 2020-05-26 MED ORDER — SODIUM CHLORIDE 0.9 % IV SOLN
Freq: Once | INTRAVENOUS | Status: AC
  Filled 2020-05-26: qty 250

## 2020-05-26 MED ORDER — DIPHENHYDRAMINE HCL 25 MG PO CAPS
25.0000 mg | ORAL_CAPSULE | Freq: Once | ORAL | Status: AC
  Administered 2020-05-26: 25 mg via ORAL

## 2020-05-26 MED ORDER — DIPHENHYDRAMINE HCL 25 MG PO CAPS
ORAL_CAPSULE | ORAL | Status: AC
  Filled 2020-05-26: qty 1

## 2020-05-26 MED ORDER — SODIUM CHLORIDE 0.9 % IV SOLN
200.0000 mg | Freq: Once | INTRAVENOUS | Status: AC
  Administered 2020-05-26: 200 mg via INTRAVENOUS
  Filled 2020-05-26: qty 200

## 2020-05-26 NOTE — Progress Notes (Signed)
Pt discharged in no apparent distress. Pt left ambulatory without assistance. Pt aware of discharge instructions and verbalized understanding and had no further questions.  

## 2020-05-26 NOTE — Patient Instructions (Signed)

## 2020-05-26 NOTE — Telephone Encounter (Signed)
He is following with hematology for this condition now- nay management/medications concerning his anemia will come from them moving forward.   Thanks.

## 2020-05-26 NOTE — Telephone Encounter (Signed)
Is this okay or will pt need to f/u with Hematology?

## 2020-05-29 ENCOUNTER — Inpatient Hospital Stay: Payer: Medicare Other

## 2020-05-29 ENCOUNTER — Other Ambulatory Visit: Payer: Self-pay

## 2020-05-29 VITALS — BP 193/55 | HR 64 | Temp 97.5°F | Resp 17

## 2020-05-29 DIAGNOSIS — D500 Iron deficiency anemia secondary to blood loss (chronic): Secondary | ICD-10-CM | POA: Diagnosis not present

## 2020-05-29 DIAGNOSIS — K922 Gastrointestinal hemorrhage, unspecified: Secondary | ICD-10-CM | POA: Diagnosis not present

## 2020-05-29 MED ORDER — SODIUM CHLORIDE 0.9% FLUSH
10.0000 mL | Freq: Once | INTRAVENOUS | Status: DC | PRN
  Filled 2020-05-29: qty 10

## 2020-05-29 MED ORDER — DIPHENHYDRAMINE HCL 50 MG/ML IJ SOLN: 50.0000 mg | Freq: Once | INTRAMUSCULAR | Status: DC | PRN

## 2020-05-29 MED ORDER — DIPHENHYDRAMINE HCL 25 MG PO CAPS
25.0000 mg | ORAL_CAPSULE | Freq: Once | ORAL | Status: AC
  Administered 2020-05-29: 25 mg via ORAL

## 2020-05-29 MED ORDER — SODIUM CHLORIDE 0.9 % IV SOLN
Freq: Once | INTRAVENOUS | Status: DC | PRN
  Filled 2020-05-29: qty 250

## 2020-05-29 MED ORDER — ACETAMINOPHEN 325 MG PO TABS
ORAL_TABLET | ORAL | Status: AC
  Filled 2020-05-29: qty 2

## 2020-05-29 MED ORDER — SODIUM CHLORIDE 0.9 % IV SOLN
200.0000 mg | Freq: Once | INTRAVENOUS | Status: AC
  Administered 2020-05-29: 200 mg via INTRAVENOUS
  Filled 2020-05-29: qty 200

## 2020-05-29 MED ORDER — METHYLPREDNISOLONE SODIUM SUCC 125 MG IJ SOLR: 125.0000 mg | Freq: Once | INTRAMUSCULAR | Status: DC | PRN

## 2020-05-29 MED ORDER — ALBUTEROL SULFATE (2.5 MG/3ML) 0.083% IN NEBU
2.5000 mg | INHALATION_SOLUTION | Freq: Once | RESPIRATORY_TRACT | Status: DC | PRN
  Filled 2020-05-29: qty 3

## 2020-05-29 MED ORDER — EPINEPHRINE 0.3 MG/0.3ML IJ SOAJ: 0.3000 mg | Freq: Once | INTRAMUSCULAR | Status: DC | PRN

## 2020-05-29 MED ORDER — FAMOTIDINE IN NACL 20-0.9 MG/50ML-% IV SOLN: 20.0000 mg | Freq: Once | INTRAVENOUS | Status: DC | PRN

## 2020-05-29 MED ORDER — SODIUM CHLORIDE 0.9% FLUSH
3.0000 mL | Freq: Once | INTRAVENOUS | Status: DC | PRN
  Filled 2020-05-29: qty 10

## 2020-05-29 MED ORDER — DIPHENHYDRAMINE HCL 25 MG PO CAPS
ORAL_CAPSULE | ORAL | Status: AC
  Filled 2020-05-29: qty 1

## 2020-05-29 MED ORDER — SODIUM CHLORIDE 0.9 % IV SOLN
Freq: Once | INTRAVENOUS | Status: AC
  Filled 2020-05-29: qty 250

## 2020-05-29 MED ORDER — ACETAMINOPHEN 325 MG PO TABS
650.0000 mg | ORAL_TABLET | Freq: Once | ORAL | Status: AC
  Administered 2020-05-29: 650 mg via ORAL

## 2020-05-29 NOTE — Progress Notes (Signed)
Pt declined to stay for post infusion observation period. Pt stated he has tolerated medication multiple times prior without difficulty. Pt aware to call clinic with any questions or concerns. Pt verbalized understanding and had no further questions.  Pt discharged in stable condition. 

## 2020-05-29 NOTE — Patient Instructions (Signed)

## 2020-05-30 ENCOUNTER — Other Ambulatory Visit: Payer: Self-pay | Admitting: *Deleted

## 2020-05-30 NOTE — Patient Outreach (Signed)
[facility]) Care Management  05/30/2020  Marvin Salinas 10-31-1945 [id number]   RN Health Coach Monthly Outreach  Referral Date:12/03/2019 Referral Source:EMMI Prevent Screening Reason for Referral:Disease Management Education Insurance:[employer] Medicare   Outreach Attempt:  Outreach attempt #1 to patient for follow up. No answer. RN Health Coach left voicemail message along with contact information.  Plan:  RN Health Coach will make another outreach attempt within the month of January if no return call back from patient.   [place] [phone] [name]

## 2020-06-02 ENCOUNTER — Inpatient Hospital Stay: Payer: Medicare Other

## 2020-06-02 ENCOUNTER — Other Ambulatory Visit: Payer: Self-pay

## 2020-06-02 VITALS — BP 166/74 | HR 60 | Temp 97.4°F | Resp 17

## 2020-06-02 DIAGNOSIS — K922 Gastrointestinal hemorrhage, unspecified: Secondary | ICD-10-CM | POA: Diagnosis not present

## 2020-06-02 DIAGNOSIS — D500 Iron deficiency anemia secondary to blood loss (chronic): Secondary | ICD-10-CM

## 2020-06-02 MED ORDER — SODIUM CHLORIDE 0.9 % IV SOLN
200.0000 mg | Freq: Once | INTRAVENOUS | Status: AC
  Administered 2020-06-02: 200 mg via INTRAVENOUS
  Filled 2020-06-02: qty 200

## 2020-06-02 MED ORDER — ACETAMINOPHEN 325 MG PO TABS
ORAL_TABLET | ORAL | Status: AC
  Filled 2020-06-02: qty 2

## 2020-06-02 MED ORDER — ACETAMINOPHEN 325 MG PO TABS
650.0000 mg | ORAL_TABLET | Freq: Once | ORAL | Status: AC
  Administered 2020-06-02: 650 mg via ORAL

## 2020-06-02 MED ORDER — SODIUM CHLORIDE 0.9 % IV SOLN
Freq: Once | INTRAVENOUS | Status: AC
  Filled 2020-06-02: qty 250

## 2020-06-02 MED ORDER — DIPHENHYDRAMINE HCL 25 MG PO CAPS
ORAL_CAPSULE | ORAL | Status: AC
  Filled 2020-06-02: qty 2

## 2020-06-02 MED ORDER — DIPHENHYDRAMINE HCL 25 MG PO CAPS
25.0000 mg | ORAL_CAPSULE | Freq: Once | ORAL | Status: AC
  Administered 2020-06-02: 25 mg via ORAL

## 2020-06-02 NOTE — Progress Notes (Signed)
Pt declined to stay for post infusion observation period. Pt stated he has tolerated medication multiple times prior without difficulty. Pt aware to call clinic with any questions or concerns. Pt verbalized understanding and had no further questions.  Pt discharged in stable condition. 

## 2020-06-02 NOTE — Patient Instructions (Signed)

## 2020-06-03 ENCOUNTER — Telehealth: Payer: Self-pay

## 2020-06-03 NOTE — Telephone Encounter (Signed)
Per 06/03/20 inbasket message please move 06/17/20 appt out two weeks-done/aware..... AOM

## 2020-06-15 ENCOUNTER — Other Ambulatory Visit: Payer: Self-pay | Admitting: Family Medicine

## 2020-06-17 ENCOUNTER — Inpatient Hospital Stay: Payer: Medicare Other | Admitting: Family

## 2020-06-17 ENCOUNTER — Inpatient Hospital Stay: Payer: Medicare Other

## 2020-07-01 ENCOUNTER — Inpatient Hospital Stay (HOSPITAL_BASED_OUTPATIENT_CLINIC_OR_DEPARTMENT_OTHER): Payer: Medicare Other | Admitting: Family

## 2020-07-01 ENCOUNTER — Telehealth: Payer: Self-pay

## 2020-07-01 ENCOUNTER — Encounter: Payer: Self-pay | Admitting: Family

## 2020-07-01 ENCOUNTER — Inpatient Hospital Stay: Payer: Medicare Other | Attending: Hematology & Oncology

## 2020-07-01 ENCOUNTER — Encounter: Payer: Self-pay | Admitting: Cardiovascular Disease

## 2020-07-01 ENCOUNTER — Other Ambulatory Visit: Payer: Self-pay

## 2020-07-01 ENCOUNTER — Ambulatory Visit: Payer: Medicare Other | Admitting: Cardiovascular Disease

## 2020-07-01 VITALS — BP 140/72 | HR 65 | Ht 68.0 in | Wt 180.2 lb

## 2020-07-01 VITALS — BP 152/65 | HR 63 | Temp 97.7°F | Resp 18 | Ht 68.0 in | Wt 179.0 lb

## 2020-07-01 DIAGNOSIS — I739 Peripheral vascular disease, unspecified: Secondary | ICD-10-CM

## 2020-07-01 DIAGNOSIS — Z794 Long term (current) use of insulin: Secondary | ICD-10-CM | POA: Diagnosis not present

## 2020-07-01 DIAGNOSIS — Z79899 Other long term (current) drug therapy: Secondary | ICD-10-CM | POA: Diagnosis not present

## 2020-07-01 DIAGNOSIS — I779 Disorder of arteries and arterioles, unspecified: Secondary | ICD-10-CM | POA: Diagnosis not present

## 2020-07-01 DIAGNOSIS — Z7984 Long term (current) use of oral hypoglycemic drugs: Secondary | ICD-10-CM | POA: Diagnosis not present

## 2020-07-01 DIAGNOSIS — E785 Hyperlipidemia, unspecified: Secondary | ICD-10-CM | POA: Diagnosis not present

## 2020-07-01 DIAGNOSIS — D500 Iron deficiency anemia secondary to blood loss (chronic): Secondary | ICD-10-CM

## 2020-07-01 DIAGNOSIS — K922 Gastrointestinal hemorrhage, unspecified: Secondary | ICD-10-CM | POA: Insufficient documentation

## 2020-07-01 DIAGNOSIS — I2510 Atherosclerotic heart disease of native coronary artery without angina pectoris: Secondary | ICD-10-CM

## 2020-07-01 DIAGNOSIS — Z7901 Long term (current) use of anticoagulants: Secondary | ICD-10-CM | POA: Diagnosis not present

## 2020-07-01 DIAGNOSIS — I4819 Other persistent atrial fibrillation: Secondary | ICD-10-CM | POA: Diagnosis not present

## 2020-07-01 DIAGNOSIS — E119 Type 2 diabetes mellitus without complications: Secondary | ICD-10-CM | POA: Diagnosis not present

## 2020-07-01 DIAGNOSIS — Z720 Tobacco use: Secondary | ICD-10-CM | POA: Diagnosis not present

## 2020-07-01 LAB — CBC WITH DIFFERENTIAL (CANCER CENTER ONLY)
Abs Immature Granulocytes: 0.11 10*3/uL — ABNORMAL HIGH (ref 0.00–0.07)
Basophils Absolute: 0.1 10*3/uL (ref 0.0–0.1)
Basophils Relative: 1 %
Eosinophils Absolute: 0.3 10*3/uL (ref 0.0–0.5)
Eosinophils Relative: 4 %
HCT: 41.4 % (ref 39.0–52.0)
Hemoglobin: 13.2 g/dL (ref 13.0–17.0)
Immature Granulocytes: 1 %
Lymphocytes Relative: 16 %
Lymphs Abs: 1.4 10*3/uL (ref 0.7–4.0)
MCH: 26 pg (ref 26.0–34.0)
MCHC: 31.9 g/dL (ref 30.0–36.0)
MCV: 81.7 fL (ref 80.0–100.0)
Monocytes Absolute: 0.6 10*3/uL (ref 0.1–1.0)
Monocytes Relative: 7 %
Neutro Abs: 5.9 10*3/uL (ref 1.7–7.7)
Neutrophils Relative %: 71 %
Platelet Count: 303 10*3/uL (ref 150–400)
RBC: 5.07 MIL/uL (ref 4.22–5.81)
RDW: 23 % — ABNORMAL HIGH (ref 11.5–15.5)
WBC Count: 8.4 10*3/uL (ref 4.0–10.5)
nRBC: 0 % (ref 0.0–0.2)

## 2020-07-01 LAB — CMP (CANCER CENTER ONLY)
ALT: 32 U/L (ref 0–44)
AST: 28 U/L (ref 15–41)
Albumin: 4.3 g/dL (ref 3.5–5.0)
Alkaline Phosphatase: 51 U/L (ref 38–126)
Anion gap: 10 (ref 5–15)
BUN: 23 mg/dL (ref 8–23)
CO2: 25 mmol/L (ref 22–32)
Calcium: 9.4 mg/dL (ref 8.9–10.3)
Chloride: 102 mmol/L (ref 98–111)
Creatinine: 1.51 mg/dL — ABNORMAL HIGH (ref 0.61–1.24)
GFR, Estimated: 48 mL/min — ABNORMAL LOW (ref >60)
Glucose, Bld: 248 mg/dL — ABNORMAL HIGH (ref 70–99)
Potassium: 4.6 mmol/L (ref 3.5–5.1)
Sodium: 137 mmol/L (ref 135–145)
Total Bilirubin: 0.3 mg/dL (ref 0.3–1.2)
Total Protein: 7.2 g/dL (ref 6.5–8.1)

## 2020-07-01 LAB — RETICULOCYTES
Immature Retic Fract: 12.7 % (ref 2.3–15.9)
RBC.: 5.1 MIL/uL (ref 4.22–5.81)
Retic Count, Absolute: 84.7 10*3/uL (ref 19.0–186.0)
Retic Ct Pct: 1.7 % (ref 0.4–3.1)

## 2020-07-01 NOTE — Patient Instructions (Signed)
  Testing/Procedures:  ABI AND ABDOMINAL DUPLEX IN JUNE   Follow-Up: At [facility], you and your health needs are our priority.  As part of our continuing mission to provide you with exceptional heart care, we have created designated Provider Care Teams.  These Care Teams include your primary Cardiologist (physician) and Advanced Practice Providers (APPs -  Physician Assistants and Nurse Practitioners) who all work together to provide you with the care you need, when you need it.  We recommend signing up for the patient portal called "MyChart".  Sign up information is provided on this After Visit Summary.  MyChart is used to connect with patients for Virtual Visits (Telemedicine).  Patients are able to view lab/test results, encounter notes, upcoming appointments, etc.  Non-urgent messages can be sent to your provider as well.   To learn more about what you can do with MyChart, go to [email or web address].    Your next appointment:   9 month(s)  The format for your next appointment:   In Person  Provider:   Kathlyn Sacramento, MD

## 2020-07-01 NOTE — Progress Notes (Unsigned)
Cardiology Office Note   Date:  07/03/2020   ID:  Marvin Salinas, DOB [birth year], MRN [id number]  PCP:  Ma Hillock, DO  Cardiologist:  Dr. Acie Fredrickson  No chief complaint on file.     History of Present Illness: Marvin Salinas is a 75 y.o. male who is here today for a follow-up visit regarding peripheral arterial disease.  He has known history of coronary artery disease status post CABG in 2001, paroxysmal atrial fibrillation on anticoagulation, essential hypertension, carotid disease, hyperlipidemia, diabetes and prior TIAs. Patient has known history of peripheral arterial disease .  He does have underlying chronic kidney disease with creatinine between 1.6-2.  The patient is followed for peripheral arterial disease with previous severe claudication.  Angiography in December 2020 showed heavily calcified subtotal occlusion of the left common iliac artery with moderate disease affecting the right common iliac artery.  I performed successful angioplasty and and balloon expandable stent placement to the left common iliac artery.   He has known history of low back pain and bilateral knee joint pain.   Most recent noninvasive vascular studies showed noncompressible vessels.  Duplex showed significant stenosis affecting the distal right common iliac artery with peak velocity of 345.  Left common iliac stent was patent with peak velocity of 251 in the distal segment.  The patient was diagnosed with severe lumbar radiculopathy but did not improve significantly with steroid injections. Currently with no claudication. He was diagnosed with severe iron deficiency anemia and he is still feeling fatigued. His hemoglobin was down to 8.1.  Past Medical History:  Diagnosis Date  . Acute kidney injury superimposed on chronic kidney disease ([place]) 10/12/2017  . Atrial fibrillation with RVR ([place]) 10/27/2017  . Back pain   . Basilar artery stenosis    on chronic Plavix  . Benign prostatic hypertrophy  without urinary obstruction 04/17/2014  . Carotid artery disease ([place]) 10/06/2010   Carotid US 04/2019: Bilat ICA 40-59; L subclavian stenosis  // Carotid US 11/21: Bilat ICA 40-59; bilateral subclavian stenosis   . Cerebral vascular disease    with prior TIA's; followed by Dr. Erling Cruz  . Depression, neurotic 11/21/2013  . Diabetes mellitus    on insulin  . Enthesopathy of ankle and tarsus 09/04/2007   Overview:  Metatarsalgia   . History of renal calculi   . Hyperlipidemia   . Hypertension   . Ischemic heart disease    prior PCI to RCA in 1989. S/P PCI to LAD and OM in 1992. S/P PCI to first DX in 2000. S/P CABG x 3 in May 2011  . OSA (obstructive sleep apnea) 01/11/2018   AHI 18.1 and SaO2 low 73%  . PAD (peripheral artery disease) ([place]) 11/18/2017   ABIs/Arterial US 04/2019: R 1.21; L 0.66 // R SFA 30-49, stable > 50 CIA and EIA stenosis; L > 50 CIA stenosis (likely represents severe stenosis or short segment occlusion)  . Peripheral neuropathy   . Pneumonia Sept 2012  . Stroke ([place]) 04/16/2001   small right cerebellar infarct on 04/16/2001 at that time he was found to have proximal left vertebral artery, proximal left common carotid artery and both external carotid artery stenosis as well as intracranial stenosis involving mid basilar artery- 07/2011 add questionable TIA    Past Surgical History:  Procedure Laterality Date  .  NASAL ENDOSCOPY  01/11/2020   chronic rhinitis w/o evidence of acute sinusitis, bilateral inferior turbinate hypertrophy  . ABDOMINAL AORTOGRAM W/LOWER EXTREMITY Bilateral 05/30/2019  Procedure: ABDOMINAL AORTOGRAM W/LOWER EXTREMITY;  Surgeon: Wellington Hampshire, MD;  Location: [facility];  Service: Cardiovascular;  Laterality: Bilateral;  . ANGIOPLASTY  1989   right coronary artery  . ANGIOPLASTY  1992   LAD and OM  . ANGIOPLASTY  1998   First DX  . BRAIN SURGERY     on prior records  . CORONARY ARTERY BYPASS GRAFT  11/12/2009   LIMA to LAD, SVG to  OM and SVG to RCA  . CORONARY STENT PLACEMENT  2000   Stent to LAD/Circumflex with angioplasty to first diagonal   . LEFT HEART CATH AND CORS/GRAFTS ANGIOGRAPHY N/A 10/13/2017   Procedure: LEFT HEART CATH AND CORS/GRAFTS ANGIOGRAPHY;  Surgeon: Nelva Bush, MD;  Location: [facility];  Service: Cardiovascular;  Laterality: N/A;     Current Outpatient Medications  Medication Sig Dispense Refill  . apixaban (ELIQUIS) 5 MG TABS tablet Take 1 tablet (5 mg total) by mouth 2 (two) times daily. Discontinue Xarelto, 180 tablet 1  . BD INSULIN SYRINGE U/F 31G X 5/16" 0.5 ML MISC USE TO INJECT INSULIN TWICE DAILY AS DIRECTED 200 each 1  . Carboxymethylcell-Hypromellose (GENTEAL OP) Place 1 drop into both eyes daily as needed (dry eyes).    . cholecalciferol (VITAMIN D3) 25 MCG (1000 UNIT) tablet Take 1,000 Units by mouth daily.    . clopidogrel (PLAVIX) 75 MG tablet TAKE 1 TABLET BY MOUTH EVERY DAY 90 tablet 3  . clotrimazole (CLOTRIMAZOLE ANTI-FUNGAL) 1 % cream Apply 1 application topically 2 (two) times daily. 30 g 5  . co-enzyme Q-10 30 MG capsule Take 30 mg by mouth daily.    Marland Kitchen dofetilide (TIKOSYN) 125 MCG capsule TAKE 1 CAPSULE BY MOUTH TWICE A DAY 180 capsule 3  . ezetimibe (ZETIA) 10 MG tablet Take 1 tablet (10 mg total) by mouth daily. 90 tablet 3  . ferrous sulfate 324 (65 Fe) MG TBEC Take 1 tablet (325 mg total) by mouth in the morning, at noon, and at bedtime. 90 tablet 1  . fluticasone (FLONASE) 50 MCG/ACT nasal spray Place 2 sprays into both nostrils daily.    Marland Kitchen glipiZIDE (GLUCOTROL) 10 MG tablet Take 1 tablet (10 mg total) by mouth 2 (two) times daily before a meal. 180 tablet 1  . insulin glargine (LANTUS) 100 UNIT/ML injection 52-55 units subq inj. BID 30 mL 5  . Lancets (ONETOUCH DELICA PLUS [id number]) MISC ONETOUCH VERIO LANCETS USE TO TEST YOUR BLOOD SUGAR TWICE A DAY FINGER STICK    . loratadine (CLARITIN) 10 MG tablet Take 10 mg by mouth daily as needed for allergies.     . metoprolol tartrate (LOPRESSOR) 25 MG tablet TAKE 0.5 TABLETS (12.5 MG TOTAL) BY MOUTH 2 (TWO) TIMES DAILY. 90 tablet 2  . nitroGLYCERIN (NITROSTAT) 0.4 MG SL tablet PLACE 1 TABLET (0.4 MG TOTAL) UNDER THE TONGUE EVERY 5 (FIVE) MINUTES AS NEEDED FOR CHEST PAIN. (Patient not taking: Reported on 07/01/2020) 25 tablet 12  . olmesartan (BENICAR) 20 MG tablet Take 1 tablet (20 mg total) by mouth daily. 90 tablet 1  . ONETOUCH VERIO test strip USE WITH METER TWICE PER DAY DX DM TYPE 2    . pantoprazole (PROTONIX) 40 MG tablet Take 1 tablet (40 mg total) by mouth in the morning. 30 tablet 6  . rosuvastatin (CRESTOR) 20 MG tablet Take 1 tablet (20 mg total) by mouth at bedtime. 90 tablet 3  . Semaglutide (RYBELSUS) 7 MG TABS Take 1 tablet by mouth  daily with breakfast. 90 tablet 1   No current facility-administered medications for this visit.    Allergies:   Codeine, Lisinopril, Nsaids, and Latex    Social History:  The patient  reports that he quit smoking about 3 months ago. His smoking use included cigarettes. He has a 13.50 pack-year smoking history. He has never used smokeless tobacco. He reports current alcohol use. He reports that he does not use drugs.   Family History:  The patient's family history includes Asthma in his brother; Depression in his mother; Diabetes in his brother and sister; Early death in his mother; Heart attack in his father; Heart disease in his father; Hypertension in his father; Kidney disease in his mother; Ovarian cancer in his mother; Stroke in an other family member.    ROS:  Please see the history of present illness.   Otherwise, review of systems are positive for none.   All other systems are reviewed and negative.    PHYSICAL EXAM: VS:  BP 140/72   Pulse 65   Ht 5\' 8"  (1.727 m)   Wt 180 lb 3.2 oz (81.7 kg)   SpO2 99%   BMI 27.40 kg/m  , BMI Body mass index is 27.4 kg/m. GEN: Well nourished, well developed, in no acute distress  HEENT: normal  Neck: no  JVD,  or masses .  Bilateral carotid bruits. Cardiac: RRR; no murmurs, rubs, or gallops,no edema  Respiratory:  clear to auscultation bilaterally, normal work of breathing GI: soft, nontender, nondistended, + BS MS: no deformity or atrophy  Skin: warm and dry, no rash Neuro:  Strength and sensation are intact Psych: euthymic mood, full affect  Vascular: Femoral pulses are slightly diminished bilaterally.    EKG:  EKG is not ordered today.   Recent Labs: 10/01/2019: Magnesium 2.0 04/29/2020: TSH 0.89 07/01/2020: ALT 32; BUN 23; Creatinine 1.51; Hemoglobin 13.2; Platelet Count 303; Potassium 4.6; Sodium 137    Lipid Panel    Component Value Date/Time   CHOL 110 10/01/2019 0944   TRIG 204 (H) 10/01/2019 0944   HDL 42 10/01/2019 0944   CHOLHDL 2.6 10/01/2019 0944   CHOLHDL 4.1 10/12/2017 0411   VLDL 18 10/12/2017 0411   LDLCALC 36 10/01/2019 0944      Wt Readings from Last 3 Encounters:  07/01/20 179 lb (81.2 kg)  07/01/20 180 lb 3.2 oz (81.7 kg)  05/13/20 181 lb (82.1 kg)        PAD Screen 11/30/2016  Previous PAD dx? Yes  Previous surgical procedure? No  Pain with walking? Yes  Subsides with rest? No  Feet/toe relief with dangling? Yes  Painful, non-healing ulcers? No  Extremities discolored? No      ASSESSMENT AND PLAN:  1.  Peripheral arterial disease: Status post  stent placement to the left common iliac artery for subtotal occlusion. Currently with no claudication. He has neuropathic pain related to his lumbar radiculopathy. Repeat ABI and aortoiliac duplex in June.  2. Coronary artery disease without angina: Continue medical therapy.  He is on clopidogrel. I am going to discuss with Dr. Elease Hashimoto and see if clopidogrel can be discontinued given that he is on long-term anticoagulation and is dealing with significant iron deficiency anemia.  3. Tobacco use: He quit smoking last year and then smoked a few cigarettes around the holidays but quit again.  4. Carotid  disease: Carotid Doppler progression to 40 to 59% range.  Repeat study in November of this year  5. Hyperlipidemia: Currently on  rosuvastatin and Zetia.  Most recent LDL was 36.  6.  Persistent atrial fibrillation: Maintaining in sinus rhythm with dofetilide.  On long-term anticoagulation with Eliquis.   Disposition:   FU with me in 9 month.   Signed,  Kathlyn Sacramento, MD  07/03/2020 7:56 AM    [place]

## 2020-07-01 NOTE — Progress Notes (Signed)
Hematology and Oncology Follow Up Visit  Marvin Salinas [id number] 04-22-1946 75 y.o. 07/01/2020   Principle Diagnosis:  Iron deficiency anemia secondary to intermittent GI blood loss  Current Therapy:   IV iron as indicated    Interim History:  Marvin Salinas is here today for follow-up. He is doing well and has no complaints at this time.  He tolerated IV iron infusions nicely and has had a great response so far. Hgb is now 13.2, MCV 81, WBC count 8.4 and platelets 303.  He has not noted any blood loss. No dark, tarry or bright red bloody stools. No abnormal bruising, no petechiae.  No fever, chills, n/v, cough, rash, dizziness, SOB, chest pain, palpitations, abdominal pain or changes in bowel or bladder habits.  No swelling, tenderness, numbness or tingling in his extremities at this time.  No falls or syncope.  He has maintained a good appetite and is staying well hydrated. His weight is stable at 179 lbs.   ECOG Performance Status: 1 - Symptomatic but completely ambulatory  Medications:  Allergies as of 07/01/2020      Reactions   Codeine Nausea And Vomiting   Lisinopril Cough   Nsaids Other (See Comments)   CKD   Latex Hives      Medication List       Accurate as of July 01, 2020 12:55 PM. If you have any questions, ask your nurse or doctor.        apixaban 5 MG Tabs tablet Commonly known as: Eliquis Take 1 tablet (5 mg total) by mouth 2 (two) times daily. Discontinue Xarelto,   BD Insulin Syringe U/F 31G X 5/16" 0.5 ML Misc Generic drug: Insulin Syringe-Needle U-100 USE TO INJECT INSULIN TWICE DAILY AS DIRECTED   cholecalciferol 25 MCG (1000 UNIT) tablet Commonly known as: VITAMIN D3 Take 1,000 Units by mouth daily.   clopidogrel 75 MG tablet Commonly known as: PLAVIX TAKE 1 TABLET BY MOUTH EVERY DAY   clotrimazole 1 % cream Commonly known as: Clotrimazole Anti-Fungal Apply 1 application topically 2 (two) times daily.   co-enzyme Q-10 30 MG  capsule Take 30 mg by mouth daily.   dofetilide 125 MCG capsule Commonly known as: TIKOSYN TAKE 1 CAPSULE BY MOUTH TWICE A DAY   ezetimibe 10 MG tablet Commonly known as: ZETIA Take 1 tablet (10 mg total) by mouth daily.   ferrous sulfate 324 (65 Fe) MG Tbec Take 1 tablet (325 mg total) by mouth in the morning, at noon, and at bedtime.   fluticasone 50 MCG/ACT nasal spray Commonly known as: FLONASE Place 2 sprays into both nostrils daily.   GENTEAL OP Place 1 drop into both eyes daily as needed (dry eyes).   glipiZIDE 10 MG tablet Commonly known as: GLUCOTROL Take 1 tablet (10 mg total) by mouth 2 (two) times daily before a meal.   insulin glargine 100 UNIT/ML injection Commonly known as: LANTUS 52-55 units subq inj. BID   loratadine 10 MG tablet Commonly known as: CLARITIN Take 10 mg by mouth daily as needed for allergies.   metoprolol tartrate 25 MG tablet Commonly known as: LOPRESSOR TAKE 0.5 TABLETS (12.5 MG TOTAL) BY MOUTH 2 (TWO) TIMES DAILY.   nitroGLYCERIN 0.4 MG SL tablet Commonly known as: NITROSTAT PLACE 1 TABLET (0.4 MG TOTAL) UNDER THE TONGUE EVERY 5 (FIVE) MINUTES AS NEEDED FOR CHEST PAIN.   olmesartan 20 MG tablet Commonly known as: BENICAR Take 1 tablet (20 mg total) by mouth daily.   OneTouch  Delica Plus [id number] Misc ONETOUCH VERIO LANCETS USE TO TEST YOUR BLOOD SUGAR TWICE A DAY FINGER STICK   OneTouch Verio test strip Generic drug: glucose blood USE WITH METER TWICE PER DAY DX DM TYPE 2   pantoprazole 40 MG tablet Commonly known as: PROTONIX Take 1 tablet (40 mg total) by mouth in the morning.   rosuvastatin 20 MG tablet Commonly known as: CRESTOR Take 1 tablet (20 mg total) by mouth at bedtime.   Rybelsus 7 MG Tabs Generic drug: Semaglutide Take 1 tablet by mouth daily with breakfast. What changed: Another medication with the same name was removed. Continue taking this medication, and follow the directions you see here. Changed by:  Kathlyn Sacramento, MD       Allergies:  Allergies  Allergen Reactions  . Codeine Nausea And Vomiting  . Lisinopril Cough  . Nsaids Other (See Comments)    CKD  . Latex Hives    Past Medical History, Surgical history, Social history, and Family History were reviewed and updated.  Review of Systems: All other 10 point review of systems is negative.   Physical Exam:  vitals were not taken for this visit.   Wt Readings from Last 3 Encounters:  07/01/20 180 lb 3.2 oz (81.7 kg)  05/13/20 181 lb (82.1 kg)  05/09/20 180 lb (81.6 kg)    Ocular: Sclerae unicteric, pupils equal, round and reactive to light Ear-nose-throat: Oropharynx clear, dentition fair Lymphatic: No cervical or supraclavicular adenopathy Lungs no rales or rhonchi, good excursion bilaterally Heart regular rate and rhythm, no murmur appreciated Abd soft, nontender, positive bowel sounds MSK no focal spinal tenderness, no joint edema Neuro: non-focal, well-oriented, appropriate affect Breasts: Deferred   Lab Results  Component Value Date   WBC 8.4 07/01/2020   HGB 13.2 07/01/2020   HCT 41.4 07/01/2020   MCV 81.7 07/01/2020   PLT 303 07/01/2020   Lab Results  Component Value Date   FERRITIN 20 (L) 05/09/2020   IRON 155 05/09/2020   TIBC 563 (H) 05/09/2020   UIBC 408 (H) 05/09/2020   IRONPCTSAT 28 05/09/2020   Lab Results  Component Value Date   RETICCTPCT 1.7 07/01/2020   RBC 5.10 07/01/2020   RBC 5.07 07/01/2020   No results found for: KPAFRELGTCHN, LAMBDASER, KAPLAMBRATIO No results found for: IGGSERUM, IGA, IGMSERUM No results found for: Odetta Pink, SPEI   Chemistry      Component Value Date/Time   NA 139 05/09/2020 0755   NA 135 03/18/2020 1018   K 4.2 05/09/2020 0755   CL 103 05/09/2020 0755   CO2 27 05/09/2020 0755   BUN 17 05/09/2020 0755   BUN 30 (H) 03/18/2020 1018   CREATININE 1.57 (H) 05/09/2020 0755      Component Value  Date/Time   CALCIUM 9.9 05/09/2020 0755   ALKPHOS 61 05/09/2020 0755   AST 14 (L) 05/09/2020 0755   ALT 12 05/09/2020 0755   BILITOT 0.3 05/09/2020 0755       Impression and Plan: Marvin Salinas is a very pleasant 75 yo caucasian gentleman with iron deficiency anemia secondary to GI blood loss.  Iron studies are pending. We will replace again if needed.  Follow-up in 3 months.  He can contact our office with any questions or concerns.   Laverna Peace, NP 1/11/202212:55 PM

## 2020-07-01 NOTE — Telephone Encounter (Signed)
appts made and printed for pt per 07/01/20 los

## 2020-07-02 LAB — IRON AND TIBC
Iron: 55 ug/dL (ref 42–163)
Saturation Ratios: 13 % — ABNORMAL LOW (ref 20–55)
TIBC: 418 ug/dL — ABNORMAL HIGH (ref 202–409)
UIBC: 363 ug/dL (ref 117–376)

## 2020-07-02 LAB — FERRITIN: Ferritin: 98 ng/mL (ref 24–336)

## 2020-07-03 ENCOUNTER — Telehealth: Payer: Self-pay | Admitting: *Deleted

## 2020-07-03 NOTE — Telephone Encounter (Signed)
Message Received: Today [name], Marvin Cheng, MD  Wellington Hampshire, MD; P Cv [facility] Triage I think we can DC the plavix also  I've sent a message to the triage pool to DC plavix  Thanks   Phil        Previous Messages   ----- Message -----  From: Wellington Hampshire, MD  Sent: 07/03/2020  7:59 AM EST  To: Thayer Headings, MD   Hi Phil,  This is our mutual patient. He has been dealing with significant iron deficiency anemia. He is on both Eliquis and Plavix. From PAD standpoint, he does not need to continue Plavix but he has been on it for a long time likely related to his CAD and previous stroke. I wonder if we should stop Plavix and just keep him on Eliquis for now but I wanted to check with you. Thanks.

## 2020-07-03 NOTE — Telephone Encounter (Signed)
Spoke with the pt and informed him that per Dr. Acie Fredrickson, he spoke with Dr. Fletcher Anon, and they both agree that he can stop taking Plavix only, and continue taking his Eliquis regimen. Discontinued Plavix from pts med list.  Pt verbalized understanding and agrees with this plan. Pt was more than gracious for all the assistance provided.

## 2020-07-04 DIAGNOSIS — M47816 Spondylosis without myelopathy or radiculopathy, lumbar region: Secondary | ICD-10-CM | POA: Diagnosis not present

## 2020-07-08 ENCOUNTER — Inpatient Hospital Stay: Payer: Medicare Other

## 2020-07-11 ENCOUNTER — Other Ambulatory Visit: Payer: Self-pay | Admitting: *Deleted

## 2020-07-11 NOTE — Patient Outreach (Signed)
[facility]) Care Management  [place]  07/11/2020   Marvin Salinas 1945/07/10 [id number]  Subjective: Successful telephone outreach call to patient. HIPAA identifiers obtained. Patient reports feeling much better. He states he has received several iron infusions and his hemoglobin has increased to 13.2. He has much more energy and is no longer SOB. Patient explained that he is now motivated to get his diabetes under better control. He has started to walk a little to increase his physical activity and he is making much better diet decisions in regards to his diabetes by eating  healthier. His FBS ranges are 120-130 and his postprandial rages are 130-140. Patient's last A1c was 10.3 and he has a goal to lower it to 7. Patient did not have any further questions or concerns today.   Encounter Medications:  Outpatient Encounter Medications as of 07/11/2020  Medication Sig Note  . Carboxymethylcell-Hypromellose (GENTEAL OP) Place 1 drop into both eyes daily as needed (dry eyes).   . cholecalciferol (VITAMIN D3) 25 MCG (1000 UNIT) tablet Take 1,000 Units by mouth daily.   . clotrimazole (CLOTRIMAZOLE ANTI-FUNGAL) 1 % cream Apply 1 application topically 2 (two) times daily.   Marland Kitchen co-enzyme Q-10 30 MG capsule Take 30 mg by mouth daily.   Marland Kitchen dofetilide (TIKOSYN) 125 MCG capsule TAKE 1 CAPSULE BY MOUTH TWICE A DAY   . ezetimibe (ZETIA) 10 MG tablet Take 1 tablet (10 mg total) by mouth daily.   . ferrous sulfate 324 (65 Fe) MG TBEC Take 1 tablet (325 mg total) by mouth in the morning, at noon, and at bedtime.   . fluticasone (FLONASE) 50 MCG/ACT nasal spray Place 2 sprays into both nostrils daily.   Marland Kitchen glipiZIDE (GLUCOTROL) 10 MG tablet Take 1 tablet (10 mg total) by mouth 2 (two) times daily before a meal.   . insulin glargine (LANTUS) 100 UNIT/ML injection 52-55 units subq inj. BID   . Lancets (ONETOUCH DELICA PLUS [id number]) MISC ONETOUCH VERIO LANCETS USE TO TEST YOUR BLOOD SUGAR  TWICE A DAY FINGER STICK   . loratadine (CLARITIN) 10 MG tablet Take 10 mg by mouth daily as needed for allergies.   . metoprolol tartrate (LOPRESSOR) 25 MG tablet TAKE 0.5 TABLETS (12.5 MG TOTAL) BY MOUTH 2 (TWO) TIMES DAILY.   . nitroGLYCERIN (NITROSTAT) 0.4 MG SL tablet PLACE 1 TABLET (0.4 MG TOTAL) UNDER THE TONGUE EVERY 5 (FIVE) MINUTES AS NEEDED FOR CHEST PAIN.   Marland Kitchen olmesartan (BENICAR) 20 MG tablet Take 1 tablet (20 mg total) by mouth daily.   Glory Rosebush VERIO test strip USE WITH METER TWICE PER DAY DX DM TYPE 2   . pantoprazole (PROTONIX) 40 MG tablet Take 1 tablet (40 mg total) by mouth in the morning.   . rosuvastatin (CRESTOR) 20 MG tablet Take 1 tablet (20 mg total) by mouth at bedtime.   . Semaglutide (RYBELSUS) 7 MG TABS Take 1 tablet by mouth daily with breakfast. 05/09/2020: Will start this dose after completing the 3mg  dose prescription.  Marland Kitchen apixaban (ELIQUIS) 5 MG TABS tablet Take 1 tablet (5 mg total) by mouth 2 (two) times daily. Discontinue Xarelto,   . BD INSULIN SYRINGE U/F 31G X 5/16" 0.5 ML MISC USE TO INJECT INSULIN TWICE DAILY AS DIRECTED    No facility-administered encounter medications on file as of 07/11/2020.    Functional Status:  No flowsheet data found.  Fall/Depression Screening: Fall Risk  07/11/2020 04/29/2020 01/24/2019  Falls in the past year? 0 0  0  Number falls in past yr: 0 0 0  Injury with Fall? 0 0 0  Follow up Falls prevention discussed;Falls evaluation completed;Education provided Falls evaluation completed -   PHQ 2/9 Scores 04/29/2020 02/15/2020 01/24/2019  PHQ - 2 Score 0 0 0  PHQ- 9 Score - - 0    Assessment:  Goals Addressed            This Visit's Progress   . COMPLETED: [facility]) Learn More About My Health       Timeframe:  Long-Range Goal Priority:  High Start Date: 04/03/20                            Expected End Date:  07/11/20                     Follow Up Date 07/11/20   - tell my story and reason for my visit - make a list of  questions - ask questions - repeat what I heard to make sure I understand - bring a list of my medicines to the visit - speak up when I don't understand    Why is this important?   The best way to learn about your health and care is by talking to the doctor and nurse.  They will answer your questions and give you information in the way that you like best.    Notes:  05/02/20--awaiting consultation appointments to learn more about new diagnosis of anemia 07/11/20: Patient has attended several appointments regarding his iron anemia. He is receiving iron infusions and feel much better.    . COMPLETED: [facility]) Make and Keep All Appointments       Timeframe:  Long-Range Goal Priority:  High Start Date:  04/03/20                           Expected End Date: 07/11/20                      Follow Up Date 07/11/20   - ask family or friend for a ride - call to cancel if needed - keep a calendar with appointment dates    Why is this important?   Part of staying healthy is seeing the doctor for follow-up care.  If you forget your appointments, there are some things you can do to stay on track.    Notes:  Needs to schedule PCP follow up to discuss medications, concerns, and possible dermatology referral; Also needs to schedule Cardiology appointment to discuss shortness of breath and chest pains  05/02/20-attended appointment with PCP, now has consultation appointments with hematology and gastroenterology 07/11/20: Patient has attended all of his providers appointments. He explains that he does not have any difficulty managing his providers appointments.    . COMPLETED: [facility]) Manage My Medicine       Timeframe:  Long-Range Goal Priority:  High Start Date:  04/03/20                           Expected End Date: 07/11/20                      Follow Up Date 07/11/20   - call for medicine refill 2 or 3 days before it runs out - keep a list of all the medicines I take;  vitamins and herbals too -  learn to read medicine labels    Why is this important?   These steps will help you keep on track with your medicines.    Notes: [place] referral for medication reconciliation and review with patient  05/02/20--has spoken with [facility] for medication review; orders for new oral hypoglycemic (Rybelsus) still awaiting to get from the pharmacy (not in stock in the pharmacy) 07/11/20: Patient reports that he has Rybelsus medication. He explains that he does not have any difficulties affording or managing his medications.    Marland Kitchen [facility]) Monitor and Manage My Blood Sugar       Timeframe:  Long-Range Goal Priority:  High Start Date: 04/03/20                            Expected End Date: 12/18/20                     Follow Up Date 10/18/20   - check blood sugar at prescribed times - check blood sugar if I feel it is too high or too low - enter blood sugar readings and medication or insulin into daily log - take the blood sugar log to all doctor visits - take the blood sugar meter to all doctor visits    Why is this important?   Checking your blood sugar at home helps to keep it from getting very high or very low.  Writing the results in a diary or log helps the doctor know how to care for you.  Your blood sugar log should have the time, date and the results.  Also, write down the amount of insulin or other medicine that you take.  Other information, like what you ate, exercise done and how you were feeling, will also be helpful.     Notes: 07/11/20: Patient reports checking his blood sugar twice daily and as needed. He does record his values in a log.     . COMPLETED: [facility]) Set My Target A1C       Timeframe:  Long-Range Goal Priority:  Medium Start Date:  04/03/20                           Expected End Date: 07/11/20                      Follow Up Date 07/11/20   - set target A1C--Target is 7   Why is this important?   Your target A1C is decided together by you and your doctor.   It is based on several things like your age and other health issues.    Notes:  05/02/20---A1C decreased to 10.3 04/29/20 07/11/20: Patient has set his A1c goal at 7    . COMPLETED: ([facility]) Stop or Cut Down Tobacco Use       Timeframe:  Long-Range Goal Priority:  Low Start Date: 04/03/20                            Expected End Date:  07/11/20                     Follow Up Date 07/11/20   - change or avoid triggers like smoky places, drinking alcohol and other smokers Continue to remain quit smoking    Why is this  important?   To stop or cut down it is important to have support from a person or group of people who you can count on.  You will also need to think about the things that make you feel like smoking, then plan for how to handle them.    Notes:  05/02/20-remains quit smoking 07/11/20: Patient has not smoked since 9/21. Patient states he is doing very well and feels great.        Plan: RN Health Coach will send PCP a quarterly update and will call patient within the month of April. Follow-up:  Patient agrees to Care Plan and Follow-up.  Emelia Loron RN, BSN [place] [phone] [email or web address]

## 2020-07-11 NOTE — Patient Instructions (Addendum)
Goals Addressed            This Visit's Progress   . COMPLETED: [facility]) Learn More About My Health       Timeframe:  Long-Range Goal Priority:  High Start Date: 04/03/20                            Expected End Date:  07/11/20                     Follow Up Date 07/11/20   - tell my story and reason for my visit - make a list of questions - ask questions - repeat what I heard to make sure I understand - bring a list of my medicines to the visit - speak up when I don't understand    Why is this important?   The best way to learn about your health and care is by talking to the doctor and nurse.  They will answer your questions and give you information in the way that you like best.    Notes:  05/02/20--awaiting consultation appointments to learn more about new diagnosis of anemia 07/11/20: Patient has attended several appointments regarding his iron anemia. He is receiving iron infusions and feel much better.    . COMPLETED: [facility]) Make and Keep All Appointments       Timeframe:  Long-Range Goal Priority:  High Start Date:  04/03/20                           Expected End Date: 07/11/20                      Follow Up Date 07/11/20   - ask family or friend for a ride - call to cancel if needed - keep a calendar with appointment dates    Why is this important?   Part of staying healthy is seeing the doctor for follow-up care.  If you forget your appointments, there are some things you can do to stay on track.    Notes:  Needs to schedule PCP follow up to discuss medications, concerns, and possible dermatology referral; Also needs to schedule Cardiology appointment to discuss shortness of breath and chest pains  05/02/20-attended appointment with PCP, now has consultation appointments with hematology and gastroenterology 07/11/20: Patient has attended all of his providers appointments. He explains that he does not have any difficulty managing his providers appointments.    .  COMPLETED: [facility]) Manage My Medicine       Timeframe:  Long-Range Goal Priority:  High Start Date:  04/03/20                           Expected End Date: 07/11/20                      Follow Up Date 07/11/20   - call for medicine refill 2 or 3 days before it runs out - keep a list of all the medicines I take; vitamins and herbals too - learn to read medicine labels    Why is this important?   These steps will help you keep on track with your medicines.    Notes: [facility] referral for medication reconciliation and review with patient  05/02/20--has spoken with [facility] for medication review; orders for new oral hypoglycemic (  Rybelsus) still awaiting to get from the pharmacy (not in stock in the pharmacy) 07/11/20: Patient reports that he has Rybelsus medication. He explains that he does not have any difficulties affording or managing his medications.    Marland Kitchen [facility]) Monitor and Manage My Blood Sugar       Timeframe:  Long-Range Goal Priority:  High Start Date: 04/03/20                            Expected End Date: 12/18/20                     Follow Up Date 10/18/20   - check blood sugar at prescribed times - check blood sugar if I feel it is too high or too low - enter blood sugar readings and medication or insulin into daily log - take the blood sugar log to all doctor visits - take the blood sugar meter to all doctor visits    Why is this important?   Checking your blood sugar at home helps to keep it from getting very high or very low.  Writing the results in a diary or log helps the doctor know how to care for you.  Your blood sugar log should have the time, date and the results.  Also, write down the amount of insulin or other medicine that you take.  Other information, like what you ate, exercise done and how you were feeling, will also be helpful.     Notes: 07/11/20: Patient reports checking his blood sugar twice daily and as needed. He does record his values in a log.  Patient states he is motivated to improving his diabetes control. He reports maintaining a diabetic diet, increasing his physical activity by walking and doing house projects. He has a goal of lowering his A1c to 7 for his well and overall health.     . COMPLETED: [facility]) Set My Target A1C       Timeframe:  Long-Range Goal Priority:  Medium Start Date:  04/03/20                           Expected End Date: 07/11/20                      Follow Up Date 07/11/20   - set target A1C--Target is 7   Why is this important?   Your target A1C is decided together by you and your doctor.  It is based on several things like your age and other health issues.    Notes:  05/02/20---A1C decreased to 10.3 04/29/20 07/11/20: Patient has set his A1c goal at 7    . COMPLETED: ([facility]) Stop or Cut Down Tobacco Use       Timeframe:  Long-Range Goal Priority:  Low Start Date: 04/03/20                            Expected End Date:  07/11/20                     Follow Up Date 07/11/20   - change or avoid triggers like smoky places, drinking alcohol and other smokers Continue to remain quit smoking    Why is this important?   To stop or cut down it is important to have support from a person  or group of people who you can count on.  You will also need to think about the things that make you feel like smoking, then plan for how to handle them.    Notes:  05/02/20-remains quit smoking 07/11/20: Patient has not smoked since 9/21. Patient states he is doing very well and feels great.

## 2020-07-14 ENCOUNTER — Other Ambulatory Visit: Payer: Self-pay

## 2020-07-14 ENCOUNTER — Inpatient Hospital Stay: Payer: Medicare Other

## 2020-07-14 VITALS — BP 171/67 | HR 59 | Temp 97.9°F | Resp 17

## 2020-07-14 DIAGNOSIS — D500 Iron deficiency anemia secondary to blood loss (chronic): Secondary | ICD-10-CM

## 2020-07-14 DIAGNOSIS — E119 Type 2 diabetes mellitus without complications: Secondary | ICD-10-CM | POA: Diagnosis not present

## 2020-07-14 DIAGNOSIS — K922 Gastrointestinal hemorrhage, unspecified: Secondary | ICD-10-CM | POA: Diagnosis not present

## 2020-07-14 DIAGNOSIS — Z79899 Other long term (current) drug therapy: Secondary | ICD-10-CM | POA: Diagnosis not present

## 2020-07-14 DIAGNOSIS — Z794 Long term (current) use of insulin: Secondary | ICD-10-CM | POA: Diagnosis not present

## 2020-07-14 DIAGNOSIS — Z7984 Long term (current) use of oral hypoglycemic drugs: Secondary | ICD-10-CM | POA: Diagnosis not present

## 2020-07-14 DIAGNOSIS — Z7901 Long term (current) use of anticoagulants: Secondary | ICD-10-CM | POA: Diagnosis not present

## 2020-07-14 MED ORDER — SODIUM CHLORIDE 0.9 % IV SOLN
200.0000 mg | Freq: Once | INTRAVENOUS | Status: AC
  Administered 2020-07-14: 200 mg via INTRAVENOUS
  Filled 2020-07-14: qty 200

## 2020-07-14 MED ORDER — DIPHENHYDRAMINE HCL 25 MG PO CAPS
ORAL_CAPSULE | ORAL | Status: AC
  Filled 2020-07-14: qty 1

## 2020-07-14 MED ORDER — DIPHENHYDRAMINE HCL 25 MG PO CAPS
25.0000 mg | ORAL_CAPSULE | Freq: Once | ORAL | Status: AC
  Administered 2020-07-14: 25 mg via ORAL

## 2020-07-14 MED ORDER — SODIUM CHLORIDE 0.9 % IV SOLN
Freq: Once | INTRAVENOUS | Status: AC
  Filled 2020-07-14: qty 250

## 2020-07-14 MED ORDER — ACETAMINOPHEN 325 MG PO TABS
ORAL_TABLET | ORAL | Status: AC
  Filled 2020-07-14: qty 2

## 2020-07-14 MED ORDER — ACETAMINOPHEN 325 MG PO TABS
650.0000 mg | ORAL_TABLET | Freq: Once | ORAL | Status: AC
  Administered 2020-07-14: 650 mg via ORAL

## 2020-07-14 NOTE — Progress Notes (Signed)
Pt declined to stay for post infusion observation period. Pt stated he has tolerated medication multiple times prior without difficulty. Pt aware to call clinic with any questions or concerns. Pt verbalized understanding and had no further questions.   

## 2020-07-16 ENCOUNTER — Other Ambulatory Visit: Payer: Self-pay

## 2020-07-16 ENCOUNTER — Inpatient Hospital Stay: Payer: Medicare Other

## 2020-07-16 VITALS — BP 161/71 | HR 59 | Temp 97.6°F | Resp 17

## 2020-07-16 DIAGNOSIS — D500 Iron deficiency anemia secondary to blood loss (chronic): Secondary | ICD-10-CM | POA: Diagnosis not present

## 2020-07-16 DIAGNOSIS — Z79899 Other long term (current) drug therapy: Secondary | ICD-10-CM | POA: Diagnosis not present

## 2020-07-16 DIAGNOSIS — Z7901 Long term (current) use of anticoagulants: Secondary | ICD-10-CM | POA: Diagnosis not present

## 2020-07-16 DIAGNOSIS — K922 Gastrointestinal hemorrhage, unspecified: Secondary | ICD-10-CM | POA: Diagnosis not present

## 2020-07-16 DIAGNOSIS — Z7984 Long term (current) use of oral hypoglycemic drugs: Secondary | ICD-10-CM | POA: Diagnosis not present

## 2020-07-16 DIAGNOSIS — E119 Type 2 diabetes mellitus without complications: Secondary | ICD-10-CM | POA: Diagnosis not present

## 2020-07-16 DIAGNOSIS — Z794 Long term (current) use of insulin: Secondary | ICD-10-CM | POA: Diagnosis not present

## 2020-07-16 MED ORDER — ACETAMINOPHEN 325 MG PO TABS: 650.0000 mg | ORAL_TABLET | Freq: Once | ORAL | Status: DC

## 2020-07-16 MED ORDER — SODIUM CHLORIDE 0.9 % IV SOLN
200.0000 mg | Freq: Once | INTRAVENOUS | Status: AC
  Administered 2020-07-16: 200 mg via INTRAVENOUS
  Filled 2020-07-16: qty 200

## 2020-07-16 MED ORDER — DIPHENHYDRAMINE HCL 25 MG PO CAPS: 25.0000 mg | ORAL_CAPSULE | Freq: Once | ORAL | Status: DC

## 2020-07-16 MED ORDER — SODIUM CHLORIDE 0.9 % IV SOLN
Freq: Once | INTRAVENOUS | Status: AC
  Filled 2020-07-16: qty 250

## 2020-07-16 NOTE — Patient Instructions (Signed)

## 2020-07-16 NOTE — Progress Notes (Signed)
Pt declined to stay for post infusion observation period. Pt stated he has tolerated medication multiple times prior without difficulty. Pt aware to call clinic with any questions or concerns. Pt verbalized understanding and had no further questions.   

## 2020-07-17 DIAGNOSIS — M47816 Spondylosis without myelopathy or radiculopathy, lumbar region: Secondary | ICD-10-CM | POA: Diagnosis not present

## 2020-07-18 ENCOUNTER — Other Ambulatory Visit: Payer: Self-pay

## 2020-07-18 ENCOUNTER — Inpatient Hospital Stay: Payer: Medicare Other

## 2020-07-18 VITALS — BP 168/68 | HR 64 | Resp 17

## 2020-07-18 DIAGNOSIS — E119 Type 2 diabetes mellitus without complications: Secondary | ICD-10-CM | POA: Diagnosis not present

## 2020-07-18 DIAGNOSIS — K922 Gastrointestinal hemorrhage, unspecified: Secondary | ICD-10-CM | POA: Diagnosis not present

## 2020-07-18 DIAGNOSIS — Z794 Long term (current) use of insulin: Secondary | ICD-10-CM | POA: Diagnosis not present

## 2020-07-18 DIAGNOSIS — D500 Iron deficiency anemia secondary to blood loss (chronic): Secondary | ICD-10-CM

## 2020-07-18 DIAGNOSIS — Z7984 Long term (current) use of oral hypoglycemic drugs: Secondary | ICD-10-CM | POA: Diagnosis not present

## 2020-07-18 DIAGNOSIS — Z7901 Long term (current) use of anticoagulants: Secondary | ICD-10-CM | POA: Diagnosis not present

## 2020-07-18 DIAGNOSIS — Z79899 Other long term (current) drug therapy: Secondary | ICD-10-CM | POA: Diagnosis not present

## 2020-07-18 MED ORDER — DIPHENHYDRAMINE HCL 25 MG PO CAPS: 25.0000 mg | ORAL_CAPSULE | Freq: Once | ORAL | Status: DC

## 2020-07-18 MED ORDER — ACETAMINOPHEN 325 MG PO TABS: 650.0000 mg | ORAL_TABLET | Freq: Once | ORAL | Status: DC

## 2020-07-18 MED ORDER — SODIUM CHLORIDE 0.9 % IV SOLN
200.0000 mg | Freq: Once | INTRAVENOUS | Status: AC
  Administered 2020-07-18: 200 mg via INTRAVENOUS
  Filled 2020-07-18: qty 200

## 2020-07-18 MED ORDER — SODIUM CHLORIDE 0.9 % IV SOLN
Freq: Once | INTRAVENOUS | Status: AC
  Filled 2020-07-18: qty 250

## 2020-07-18 NOTE — Patient Instructions (Signed)

## 2020-07-28 ENCOUNTER — Other Ambulatory Visit (INDEPENDENT_AMBULATORY_CARE_PROVIDER_SITE_OTHER): Payer: Medicare Other

## 2020-07-28 DIAGNOSIS — D509 Iron deficiency anemia, unspecified: Secondary | ICD-10-CM | POA: Diagnosis not present

## 2020-07-28 LAB — CBC WITH DIFFERENTIAL/PLATELET
Basophils Absolute: 0.1 10*3/uL (ref 0.0–0.1)
Basophils Relative: 0.9 % (ref 0.0–3.0)
Eosinophils Absolute: 0.2 10*3/uL (ref 0.0–0.7)
Eosinophils Relative: 2.3 % (ref 0.0–5.0)
HCT: 42 % (ref 39.0–52.0)
Hemoglobin: 14.6 g/dL (ref 13.0–17.0)
Lymphocytes Relative: 17.6 % (ref 12.0–46.0)
Lymphs Abs: 1.4 10*3/uL (ref 0.7–4.0)
MCHC: 34.8 g/dL (ref 30.0–36.0)
MCV: 83 fl (ref 78.0–100.0)
Monocytes Absolute: 0.6 10*3/uL (ref 0.1–1.0)
Monocytes Relative: 7.7 % (ref 3.0–12.0)
Neutro Abs: 5.8 10*3/uL (ref 1.4–7.7)
Neutrophils Relative %: 71.5 % (ref 43.0–77.0)
Platelets: 274 10*3/uL (ref 150.0–400.0)
RBC: 5.06 Mil/uL (ref 4.22–5.81)
RDW: 23 % — ABNORMAL HIGH (ref 11.5–15.5)
WBC: 8.1 10*3/uL (ref 4.0–10.5)

## 2020-07-30 ENCOUNTER — Encounter: Payer: Self-pay | Admitting: Gastroenterology

## 2020-07-30 ENCOUNTER — Ambulatory Visit (INDEPENDENT_AMBULATORY_CARE_PROVIDER_SITE_OTHER): Payer: Medicare Other | Admitting: Gastroenterology

## 2020-07-30 DIAGNOSIS — D509 Iron deficiency anemia, unspecified: Secondary | ICD-10-CM

## 2020-07-30 DIAGNOSIS — R195 Other fecal abnormalities: Secondary | ICD-10-CM | POA: Diagnosis not present

## 2020-07-30 NOTE — Progress Notes (Signed)
Review of pertinent gastrointestinal problems: 1.  Iron deficiency on anemia, Hemoccult positive hemoglobin 8.21 April 2020.  Dual anticoagulation with Eliquis and Plavix.  Very possibly his anemia was from the polyps in his colon as he was having a minor intermittent rectal bleeding.  He was also having pink urine periodically.  Th these overt bleeding is completely stopped after he came off Plavix and the polyps were removed.  Colonoscopy November 2021 Dr. Ardis Hughs found and removed 5 adenomas from his colon ranging in size from 4 mm to 10 mm.  He was recommended to have repeat examination in 3 years.  EGD November 2021 Dr. Ardis Hughs found very mild nonspecific gastritis.  Biopsies from duodenum were normal.   HPI: [name] whom I last saw the time of colonoscopy and upper endoscopy.  CBC 2 days ago showed hemoglobin 14.6, MCV 83, RDW 23.  This is a huge improvement from November 2021 at which time his hemoglobin was 8.1, his MCV was 70.2.  He has received 8 iron infusions and his blood counts are much much improved.  He stopped Plavix about 1 month ago and is now on Eliquis monotherapy.  He is very clear today that he was having minor intermittent rectal bleeding as well as minor pink-colored urine intermittently prior to the colonoscopy, prior to stopping his Plavix.  That overt bleeding has completely stopped.  He feels generally very well.  His weight is overall stable.   ROS: complete GI ROS as described in HPI, all other review negative.  Constitutional:  No unintentional weight loss   Past Medical History:  Diagnosis Date  . Acute kidney injury superimposed on chronic kidney disease ([place]) 10/12/2017  . Atrial fibrillation with RVR ([place]) 10/27/2017  . Back pain   . Basilar artery stenosis    on chronic Plavix  . Benign prostatic hypertrophy without urinary obstruction 04/17/2014  . Carotid artery disease ([place]) 10/06/2010   Carotid US 04/2019:  Bilat ICA 40-59; L subclavian stenosis  // Carotid US 11/21: Bilat ICA 40-59; bilateral subclavian stenosis   . Cerebral vascular disease    with prior TIA's; followed by Dr. Erling Cruz  . Depression, neurotic 11/21/2013  . Diabetes mellitus    on insulin  . Enthesopathy of ankle and tarsus 09/04/2007   Overview:  Metatarsalgia   . History of renal calculi   . Hyperlipidemia   . Hypertension   . Ischemic heart disease    prior PCI to RCA in 1989. S/P PCI to LAD and OM in 1992. S/P PCI to first DX in 2000. S/P CABG x 3 in May 2011  . OSA (obstructive sleep apnea) 01/11/2018   AHI 18.1 and SaO2 low 73%  . PAD (peripheral artery disease) ([place]) 11/18/2017   ABIs/Arterial US 04/2019: R 1.21; L 0.66 // R SFA 30-49, stable > 50 CIA and EIA stenosis; L > 50 CIA stenosis (likely represents severe stenosis or short segment occlusion)  . Peripheral neuropathy   . Pneumonia Sept 2012  . Stroke ([place]) 04/16/2001   small right cerebellar infarct on 04/16/2001 at that time he was found to have proximal left vertebral artery, proximal left common carotid artery and both external carotid artery stenosis as well as intracranial stenosis involving mid basilar artery- 07/2011 add questionable TIA    Past Surgical History:  Procedure Laterality Date  .  NASAL ENDOSCOPY  01/11/2020   chronic rhinitis w/o evidence of acute sinusitis, bilateral inferior turbinate hypertrophy  . ABDOMINAL  AORTOGRAM W/LOWER EXTREMITY Bilateral 05/30/2019   Procedure: ABDOMINAL AORTOGRAM W/LOWER EXTREMITY;  Surgeon: Wellington Hampshire, MD;  Location: [facility];  Service: Cardiovascular;  Laterality: Bilateral;  . ANGIOPLASTY  1989   right coronary artery  . ANGIOPLASTY  1992   LAD and OM  . ANGIOPLASTY  1998   First DX  . BRAIN SURGERY     on prior records  . CORONARY ARTERY BYPASS GRAFT  11/12/2009   LIMA to LAD, SVG to OM and SVG to RCA  . CORONARY STENT PLACEMENT  2000   Stent to LAD/Circumflex with angioplasty to first  diagonal   . LEFT HEART CATH AND CORS/GRAFTS ANGIOGRAPHY N/A 10/13/2017   Procedure: LEFT HEART CATH AND CORS/GRAFTS ANGIOGRAPHY;  Surgeon: Nelva Bush, MD;  Location: [facility];  Service: Cardiovascular;  Laterality: N/A;    Current Outpatient Medications  Medication Sig Dispense Refill  . apixaban (ELIQUIS) 5 MG TABS tablet Take 1 tablet (5 mg total) by mouth 2 (two) times daily. Discontinue Xarelto, 180 tablet 1  . BD INSULIN SYRINGE U/F 31G X 5/16" 0.5 ML MISC USE TO INJECT INSULIN TWICE DAILY AS DIRECTED 200 each 1  . Carboxymethylcell-Hypromellose (GENTEAL OP) Place 1 drop into both eyes daily as needed (dry eyes).    . cholecalciferol (VITAMIN D3) 25 MCG (1000 UNIT) tablet Take 1,000 Units by mouth daily.    Marland Kitchen co-enzyme Q-10 30 MG capsule Take 30 mg by mouth daily.    Marland Kitchen dofetilide (TIKOSYN) 125 MCG capsule TAKE 1 CAPSULE BY MOUTH TWICE A DAY 180 capsule 3  . ezetimibe (ZETIA) 10 MG tablet Take 1 tablet (10 mg total) by mouth daily. 90 tablet 3  . fluticasone (FLONASE) 50 MCG/ACT nasal spray Place 2 sprays into both nostrils daily.    Marland Kitchen glipiZIDE (GLUCOTROL) 10 MG tablet Take 1 tablet (10 mg total) by mouth 2 (two) times daily before a meal. 180 tablet 1  . insulin glargine (LANTUS) 100 UNIT/ML injection 52-55 units subq inj. BID 30 mL 5  . Lancets (ONETOUCH DELICA PLUS [id number]) MISC ONETOUCH VERIO LANCETS USE TO TEST YOUR BLOOD SUGAR TWICE A DAY FINGER STICK    . loratadine (CLARITIN) 10 MG tablet Take 10 mg by mouth daily as needed for allergies.    . metoprolol tartrate (LOPRESSOR) 25 MG tablet TAKE 0.5 TABLETS (12.5 MG TOTAL) BY MOUTH 2 (TWO) TIMES DAILY. 90 tablet 2  . nitroGLYCERIN (NITROSTAT) 0.4 MG SL tablet PLACE 1 TABLET (0.4 MG TOTAL) UNDER THE TONGUE EVERY 5 (FIVE) MINUTES AS NEEDED FOR CHEST PAIN. 25 tablet 12  . olmesartan (BENICAR) 20 MG tablet Take 1 tablet (20 mg total) by mouth daily. 90 tablet 1  . ONETOUCH VERIO test strip USE WITH METER TWICE PER DAY DX  DM TYPE 2    . pantoprazole (PROTONIX) 40 MG tablet Take 1 tablet (40 mg total) by mouth in the morning. 30 tablet 6  . rosuvastatin (CRESTOR) 20 MG tablet Take 1 tablet (20 mg total) by mouth at bedtime. 90 tablet 3  . Semaglutide (RYBELSUS) 7 MG TABS Take 1 tablet by mouth daily with breakfast. 90 tablet 1   No current facility-administered medications for this visit.    Allergies as of 07/30/2020 - Review Complete 07/30/2020  Allergen Reaction Noted  . Codeine Nausea And Vomiting 10/06/2010  . Lisinopril Cough 10/06/2010  . Nsaids Other (See Comments) 07/13/2016  . Latex Hives 10/06/2010    Family History  Problem Relation Age of  Onset  . Depression Mother   . Early death Mother   . Kidney disease Mother   . Ovarian cancer Mother   . Hypertension Father   . Heart disease Father   . Heart attack Father   . Asthma Brother   . Diabetes Brother   . Stroke Other        Uncle  . Diabetes Sister   . Colon cancer Neg Hx   . Rectal cancer Neg Hx   . Stomach cancer Neg Hx   . Esophageal cancer Neg Hx     Social History   Socioeconomic History  . Marital status: Divorced    Spouse name: Not on file  . Number of children: 0  . Years of education: GED  . Highest education level: Not on file  Occupational History  . Occupation: retired  Tobacco Use  . Smoking status: Former Smoker    Packs/day: 0.25    Years: 54.00    Pack years: 13.50    Types: Cigarettes    Quit date: 03/12/2020    Years since quitting: 0.3  . Smokeless tobacco: Never Used  Vaping Use  . Vaping Use: Never used  Substance and Sexual Activity  . Alcohol use: Yes  . Drug use: No  . Sexual activity: Not Currently    Partners: Female  Other Topics Concern  . Not on file  Social History Narrative   Marital status/children/pets: divorced.   Education/employment: retired, Patient has his GED. 9th grade education.    Safety:      -smoke alarm in the home:Yes     - wears seatbelt: Yes   Patient is  right handed.   Patient does not drink any caffeine.   Social Determinants of Health   Financial Resource Strain: Low Risk   . Difficulty of Paying Living Expenses: Not hard at all  Food Insecurity: No Food Insecurity  . Worried About Charity fundraiser in the Last Year: Never true  . Ran Out of Food in the Last Year: Never true  Transportation Needs: No Transportation Needs  . Lack of Transportation (Medical): No  . Lack of Transportation (Non-Medical): No  Physical Activity: Not on file  Stress: Not on file  Social Connections: Not on file  Intimate Partner Violence: Not At Risk  . Fear of Current or Ex-Partner: No  . Emotionally Abused: No  . Physically Abused: No  . Sexually Abused: No     Physical Exam: BP (!) 152/68   Pulse (!) 59   Ht 5\' 8"  (1.727 m)   Wt 182 lb (82.6 kg)   BMI 27.67 kg/m  Constitutional: generally well-appearing Psychiatric: alert and oriented x3 Abdomen: soft, nontender, nondistended, no obvious ascites, no peritoneal signs, normal bowel sounds No peripheral edema noted in lower extremities  Assessment and plan: 75 y.o. male with iron deficiency anemia, Hemoccult positive stools  I think it is certainly possible that his iron deficiency anemia and Hemoccult-positive stools were the result of 5 colon polyps in the setting of dual blood thinning with Plavix and Eliquis.  He was indeed having minor intermittent rectal bleeding.  At the same time he was having minor intermittent hematuria.  Both of that overt bleeding has completely stopped.  I suspect coming off of Plavix was a major part of the bleeding stopping, also possibly removing those 5 polyps played a role.  I explained to him that he has had a good evaluation of his colon and of his upper  GI tract but that there are about 20 feet of small bowel in between and that to complete the GI work-up, essentially to exclude any serious sources of other potential GI bleeding such as tumors I recommended  small bowel capsule endoscopy.  It will be safe for him to stay on Eliquis.  He understands and agrees to proceed with the capsule endoscopy.  Please see the "Patient Instructions" section for addition details about the plan.  Owens Loffler, MD [facility] 07/30/2020, 8:36 AM   Total time on date of encounter was 35 minutes (this included time spent preparing to see the patient reviewing records; obtaining and/or reviewing separately obtained history; performing a medically appropriate exam and/or evaluation; counseling and educating the patient and family if present; ordering medications, tests or procedures if applicable; and documenting clinical information in the health record).

## 2020-07-30 NOTE — Patient Instructions (Signed)
CAPSULE ENDOSCOPY PATIENT INSTRUCTION Marvin Salinas December 30, 1945 [id number]   1. 08-04-2020 Seven (7) days prior to capsule endoscopy stop taking iron supplements and carafate.  2. [id number] Two (2) days prior to capsule endoscopy stop taking aspirin or any arthritis drugs.  3. 08-10-2020 Day before capsule endoscopy purchase a 238 gram bottle of Miralax from the laxative section of your drug store, and a 32 oz. bottle of Gatorade (no red).    4. 08-10-2020 One (1) day prior to capsule endoscopy: a) Stop smoking. b) Eat a regular diet until 12:00 Noon. c) After 12:00 Noon take only the following: Black coffee  Jell-O (no fruit or red Jell-o) Water   Bouillon (chicken or beef) 7-Up   Cranberry Juice Tea   Kool-Aid Popsicle (not red) Sprite   Coke Ginger Ale  Pepsi Mountain Dew Gatorade d) At 6:00 pm the evening before your appointment, drink 7 capfuls (105 grams) of Miralax with 32 oz. Gatorade. Drink 8 oz every 15 minutes until gone. e) Nothing to eat or drink after midnight except medications with a sip of water.  5. 08-11-2020 Day of capsule endoscopy:  No medications for 2 hours prior to your test.  6. Please arrive at [facility]  3rd floor patient registration area by 8:15 am on: 08-11-2020.   For any questions: Call [place] at [phone] and ask to speak with one of the capsule endoscopy nurses.  YOU WILL NEED TO RETURN THE EQUIPMENT AT 4 PM ON THE DAY OF THE PROCEDURE.  PLEASE KEEP THIS IN MIND WHEN SCHEDULING.   The above instructions have been reviewed and explained to me by____Nicole Marcelline Mates, CMA____________   Patient signature:_________________________________________     Date:________________  Small Bowel Capsule Endoscopy  What you should know: Small Bowel capsule endoscopy is a procedure that takes pictures of the inside of your small intestine (bowel).  Your small bowel connects to your stomach on one end, and your large bowel (colon) on the  other.  A capsule endoscopy is done by swallowing a pill size camera.  The capsule moves through your stomach and into your small bowel, where pictures are taken.   You may need a small bowel capsule endoscopy if you have symptoms, such as blood in your stool, chronic stomach pain, and diarrhea.  The pictures may show if you have growths, swelling, and bleeding area in you small bowel.  A capsule endoscopy may also show if diseases such as Crohn's or celiac disease are causing your symptoms.  Having a small bowel capsule endoscopy may help you and your caregiver learn the cause of your symptoms.  Learning what is causing your symptoms allows you to receive needed treatment and prevent further problems. Risks: . You may have stomach pain during your procedure.   . The pictures taken by the capsule may not be clear.   . The pictures may not show the cause of your symptoms.   . You may need another endoscopy procedure.  .  The capsule may get trapped in your esophagus or intestines. You may need surgery or additional procedures to remove the capsule from your body.    Before your procedure: You will be instructed to stop certain prescription medications or over- the -counter medications prior to the procedure.   The day before your scheduled appointment you will need to be on a restricted diet and will need to drink a bowel prep that will clean out your bowels.   The day of the  procedure: . You may drive yourself to the procedure.   . You will need to plan on 2 trips to the office on the day of the procedure. Morning: . Plan to be at the office about 45 minutes. . The morning of the procedure a sensor belt and recorder will be placed on you.  You will wear this for 8 hours.  (The sensor belt transfers pictures of your small bowel to the recorder.)   You will be given a pill-sized capsule endoscope to swallow.  Once you swallow the capsule it will travel through your body the same way food does,  constantly taking pictures along the way.  The capsule takes 2-3 pictures a second.   . Once you have left the office you may go about your normal day with a few exceptions: You may not go near a MRI machine or a radio or television towers; You need to avoid other patients having capsule endoscopy; You will be given a written diet to follow for the day.  Afternoon: . You will need to be return to the office at your designated time. . The sensors belt will be removed . You will need to be at the office about 15 minutes.  If you are age 58 or older, your body mass index should be between 23-30. Your Body mass index is 27.67 kg/m. If this is out of the aforementioned range listed, please consider follow up with your Primary Care Provider.  Due to recent changes in healthcare laws, you may see the results of your imaging and laboratory studies on MyChart before your provider has had a chance to review them.  We understand that in some cases there may be results that are confusing or concerning to you. Not all laboratory results come back in the same time frame and the provider may be waiting for multiple results in order to interpret others.  Please give [place] 48 hours in order for your provider to thoroughly review all the results before contacting the office for clarification of your results.    Thank you for entrusting me with your care and choosing [facility].  Dr Ardis Hughs

## 2020-07-31 DIAGNOSIS — M47817 Spondylosis without myelopathy or radiculopathy, lumbosacral region: Secondary | ICD-10-CM | POA: Diagnosis not present

## 2020-08-08 DIAGNOSIS — M47817 Spondylosis without myelopathy or radiculopathy, lumbosacral region: Secondary | ICD-10-CM | POA: Diagnosis not present

## 2020-08-08 DIAGNOSIS — M47816 Spondylosis without myelopathy or radiculopathy, lumbar region: Secondary | ICD-10-CM | POA: Diagnosis not present

## 2020-08-11 ENCOUNTER — Ambulatory Visit (INDEPENDENT_AMBULATORY_CARE_PROVIDER_SITE_OTHER): Payer: Medicare Other | Admitting: Gastroenterology

## 2020-08-11 ENCOUNTER — Encounter: Payer: Self-pay | Admitting: Gastroenterology

## 2020-08-11 DIAGNOSIS — D509 Iron deficiency anemia, unspecified: Secondary | ICD-10-CM

## 2020-08-11 NOTE — Patient Instructions (Signed)
Do not eat or drink for 4 hours after ingesting the capsule  You may have a clear liquid diet at 10:30 am  You may have a light lunch beginning at 12:30 ( ex 1/2 sandwich and a bowl of soup)  You may resume your normal diet at 5:00 pm  If you experience any abdominal pain, nausea ,or vomiting after ingesting the capsule please call [phone] to notify the nurse  You may not have an MRI until you have confirmation that you have passed the capsule.   Please return to the office at 4 PM today. 

## 2020-08-11 NOTE — Progress Notes (Signed)
Capsule ID: [id number] Exp: 09-22-2021 LOT: [id number]  Patient arrived for Capsule Endoscopy. Reported the prep went well but he had not had a BM after prep, I discussed this with Harrel Lemon, RN and she stated that it was okay to proceed with capsule endoscopy. I explained the dietary restrictions for the next few hours and provided a written copy for the patient as well. Patient verbalized understanding. Opened capsule, ensured capsule was flashing prior to the patient swallowing the capsule. Patient swallowed capsule without difficulty. Patient instructed to return to the office at 4:00 pm today for removal of the recording equipment, to call the office with any questions and if no capsule was visualized after 72 hours. No further questions by the conclusion of the visit.

## 2020-08-13 ENCOUNTER — Ambulatory Visit (INDEPENDENT_AMBULATORY_CARE_PROVIDER_SITE_OTHER)
Admission: RE | Admit: 2020-08-13 | Discharge: 2020-08-13 | Disposition: A | Discharge status: Home / Self Care | Payer: Medicare Other | Source: Ambulatory Visit | Attending: Gastroenterology | Admitting: Gastroenterology

## 2020-08-13 ENCOUNTER — Telehealth: Payer: Self-pay | Admitting: Gastroenterology

## 2020-08-13 ENCOUNTER — Other Ambulatory Visit: Payer: Self-pay

## 2020-08-13 DIAGNOSIS — T189XXA Foreign body of alimentary tract, part unspecified, initial encounter: Secondary | ICD-10-CM

## 2020-08-13 NOTE — Telephone Encounter (Signed)
Patient has not passed capsule endoscopy. He will come in today for a KUB.  Patient verbalized understanding to go to the basement x-ray department.

## 2020-08-13 NOTE — Telephone Encounter (Signed)
Patient called and said he has not seen the capsule since he swallowed it and is wanting to know what to do.

## 2020-08-22 ENCOUNTER — Telehealth: Payer: Self-pay | Admitting: Gastroenterology

## 2020-08-22 ENCOUNTER — Other Ambulatory Visit: Payer: Self-pay

## 2020-08-22 DIAGNOSIS — D509 Iron deficiency anemia, unspecified: Secondary | ICD-10-CM

## 2020-08-22 NOTE — Telephone Encounter (Signed)
Please let him know that his capsule endoscopy testing showed 2 or 3 small blood vessels in his small intestine which might periodically bleeding contribute to his anemia.  None of them were bleeding at the time of the capsule.   If he is not taking over-the-counter iron supplement once a day he should start.  325 mg should suffice.  He needs return office visit with me in 2 months with a CBC a few days prior to see how his blood counts are holding on oral iron supplement and decide about small bowel enteroscopy if necessary.  Thank you

## 2020-08-22 NOTE — Telephone Encounter (Signed)
Spoke with pt and he is aware. Order in for lab, he knows to start iron daily. Pt scheduled to see Dr. Ardis Hughs 10/24/20@9 :10am. Pt aware of appt.

## 2020-08-27 DIAGNOSIS — M25562 Pain in left knee: Secondary | ICD-10-CM | POA: Diagnosis not present

## 2020-09-02 DIAGNOSIS — M25562 Pain in left knee: Secondary | ICD-10-CM | POA: Diagnosis not present

## 2020-09-12 ENCOUNTER — Other Ambulatory Visit: Payer: Self-pay | Admitting: Internal Medicine

## 2020-09-12 NOTE — Telephone Encounter (Signed)
Pt last saw Dr Acie Fredrickson 03/04/20, last labs 07/01/20 Creat 1.51, age 75, weight 82.6kg, based on specified criteria pt is on appropriate dosage of Eliquis 5mg  BID.  Will refill rx.

## 2020-09-15 DIAGNOSIS — H25813 Combined forms of age-related cataract, bilateral: Secondary | ICD-10-CM | POA: Diagnosis not present

## 2020-09-15 DIAGNOSIS — H02834 Dermatochalasis of left upper eyelid: Secondary | ICD-10-CM | POA: Diagnosis not present

## 2020-09-15 DIAGNOSIS — H35033 Hypertensive retinopathy, bilateral: Secondary | ICD-10-CM | POA: Diagnosis not present

## 2020-09-15 DIAGNOSIS — H02831 Dermatochalasis of right upper eyelid: Secondary | ICD-10-CM | POA: Diagnosis not present

## 2020-09-15 DIAGNOSIS — E113293 Type 2 diabetes mellitus with mild nonproliferative diabetic retinopathy without macular edema, bilateral: Secondary | ICD-10-CM | POA: Diagnosis not present

## 2020-09-17 ENCOUNTER — Other Ambulatory Visit: Payer: Self-pay | Admitting: Cardiovascular Disease

## 2020-09-29 ENCOUNTER — Inpatient Hospital Stay (HOSPITAL_BASED_OUTPATIENT_CLINIC_OR_DEPARTMENT_OTHER): Payer: Medicare Other | Admitting: Family

## 2020-09-29 ENCOUNTER — Other Ambulatory Visit: Payer: Self-pay

## 2020-09-29 ENCOUNTER — Inpatient Hospital Stay: Payer: Medicare Other | Attending: Hematology & Oncology

## 2020-09-29 ENCOUNTER — Encounter: Payer: Self-pay | Admitting: Family

## 2020-09-29 VITALS — BP 155/62 | HR 60 | Temp 97.6°F | Resp 18 | Ht 68.0 in | Wt 174.0 lb

## 2020-09-29 DIAGNOSIS — D500 Iron deficiency anemia secondary to blood loss (chronic): Secondary | ICD-10-CM | POA: Insufficient documentation

## 2020-09-29 DIAGNOSIS — K922 Gastrointestinal hemorrhage, unspecified: Secondary | ICD-10-CM | POA: Diagnosis not present

## 2020-09-29 LAB — CMP (CANCER CENTER ONLY)
ALT: 16 U/L (ref 0–44)
AST: 16 U/L (ref 15–41)
Albumin: 4.5 g/dL (ref 3.5–5.0)
Alkaline Phosphatase: 52 U/L (ref 38–126)
Anion gap: 7 (ref 5–15)
BUN: 26 mg/dL — ABNORMAL HIGH (ref 8–23)
CO2: 28 mmol/L (ref 22–32)
Calcium: 10.1 mg/dL (ref 8.9–10.3)
Chloride: 104 mmol/L (ref 98–111)
Creatinine: 1.51 mg/dL — ABNORMAL HIGH (ref 0.61–1.24)
GFR, Estimated: 48 mL/min — ABNORMAL LOW (ref >60)
Glucose, Bld: 156 mg/dL — ABNORMAL HIGH (ref 70–99)
Potassium: 4.5 mmol/L (ref 3.5–5.1)
Sodium: 139 mmol/L (ref 135–145)
Total Bilirubin: 0.5 mg/dL (ref 0.3–1.2)
Total Protein: 6.7 g/dL (ref 6.5–8.1)

## 2020-09-29 LAB — RETICULOCYTES
Immature Retic Fract: 14.1 % (ref 2.3–15.9)
RBC.: 5.5 MIL/uL (ref 4.22–5.81)
Retic Count, Absolute: 97.9 10*3/uL (ref 19.0–186.0)
Retic Ct Pct: 1.8 % (ref 0.4–3.1)

## 2020-09-29 LAB — FERRITIN: Ferritin: 85 ng/mL (ref 24–336)

## 2020-09-29 LAB — IRON AND TIBC
Iron: 92 ug/dL (ref 42–163)
Saturation Ratios: 25 % (ref 20–55)
TIBC: 373 ug/dL (ref 202–409)
UIBC: 281 ug/dL (ref 117–376)

## 2020-09-29 LAB — CBC WITH DIFFERENTIAL (CANCER CENTER ONLY)
Abs Immature Granulocytes: 0.13 10*3/uL — ABNORMAL HIGH (ref 0.00–0.07)
Basophils Absolute: 0.1 10*3/uL (ref 0.0–0.1)
Basophils Relative: 1 %
Eosinophils Absolute: 0.2 10*3/uL (ref 0.0–0.5)
Eosinophils Relative: 2 %
HCT: 46.6 % (ref 39.0–52.0)
Hemoglobin: 16.2 g/dL (ref 13.0–17.0)
Immature Granulocytes: 1 %
Lymphocytes Relative: 10 %
Lymphs Abs: 1.2 10*3/uL (ref 0.7–4.0)
MCH: 30 pg (ref 26.0–34.0)
MCHC: 34.8 g/dL (ref 30.0–36.0)
MCV: 86.3 fL (ref 80.0–100.0)
Monocytes Absolute: 0.8 10*3/uL (ref 0.1–1.0)
Monocytes Relative: 6 %
Neutro Abs: 9.7 10*3/uL — ABNORMAL HIGH (ref 1.7–7.7)
Neutrophils Relative %: 80 %
Platelet Count: 250 10*3/uL (ref 150–400)
RBC: 5.4 MIL/uL (ref 4.22–5.81)
RDW: 13.5 % (ref 11.5–15.5)
WBC Count: 12.1 10*3/uL — ABNORMAL HIGH (ref 4.0–10.5)
nRBC: 0 % (ref 0.0–0.2)

## 2020-09-29 NOTE — Progress Notes (Signed)
Hematology and Oncology Follow Up Visit  Marvin Salinas [id number] [birth year] 75 y.o. 09/29/2020   Principle Diagnosis:  Iron deficiency anemia secondary to intermittent GI blood loss  Current Therapy:        IV iron as indicated    Interim History:  Marvin Salinas is here today for follow-up. He is doing quite well and has been taking his iron supplement by mouth once a day.   Hgb is 16.2, MCV 86 and platelets 250.  He notes that his energy is much improved. No blood loss noted. No bruising or petechiae.  No fever, chill, n/v, cough, rash, dizziness, SOB, chest pain, palpitations, abdominal pain or changes in bowel or bladder habits.  He takes Miralax daily to prevent constipation.  No swelling, tenderness, numbness or tingling in his extremities.  No falls or syncope.  He has maintained a good appetite and is staying well hydrated throughout the day. His weight is stable at 174 lbs.   ECOG Performance Status: 0 - Asymptomatic  Medications:  Allergies as of 09/29/2020      Reactions   Codeine Nausea And Vomiting   Lisinopril Cough   Nsaids Other (See Comments)   CKD   Latex Hives      Medication List       Accurate as of September 29, 2020  8:50 AM. If you have any questions, ask your nurse or doctor.        BD Insulin Syringe U/F 31G X 5/16" 0.5 ML Misc Generic drug: Insulin Syringe-Needle U-100 USE TO INJECT INSULIN TWICE DAILY AS DIRECTED   cholecalciferol 25 MCG (1000 UNIT) tablet Commonly known as: VITAMIN D3 Take 1,000 Units by mouth daily.   co-enzyme Q-10 30 MG capsule Take 30 mg by mouth daily.   dofetilide 125 MCG capsule Commonly known as: TIKOSYN TAKE 1 CAPSULE BY MOUTH TWICE A DAY   Eliquis 5 MG Tabs tablet Generic drug: apixaban TAKE 1 TABLET (5 MG TOTAL) BY MOUTH 2 (TWO) TIMES DAILY   ezetimibe 10 MG tablet Commonly known as: ZETIA Take 1 tablet (10 mg total) by mouth daily.   fluticasone 50 MCG/ACT nasal spray Commonly known as: FLONASE Place  2 sprays into both nostrils daily.   GENTEAL OP Place 1 drop into both eyes daily as needed (dry eyes).   glipiZIDE 10 MG tablet Commonly known as: GLUCOTROL Take 1 tablet (10 mg total) by mouth 2 (two) times daily before a meal.   insulin glargine 100 UNIT/ML injection Commonly known as: LANTUS 52-55 units subq inj. BID   loratadine 10 MG tablet Commonly known as: CLARITIN Take 10 mg by mouth daily as needed for allergies.   metoprolol tartrate 25 MG tablet Commonly known as: LOPRESSOR TAKE 0.5 TABLETS (12.5 MG TOTAL) BY MOUTH 2 (TWO) TIMES DAILY.   nitroGLYCERIN 0.4 MG SL tablet Commonly known as: NITROSTAT PLACE 1 TABLET (0.4 MG TOTAL) UNDER THE TONGUE EVERY 5 (FIVE) MINUTES AS NEEDED FOR CHEST PAIN.   olmesartan 20 MG tablet Commonly known as: BENICAR Take 1 tablet (20 mg total) by mouth daily.   OneTouch Delica Plus [id number] Misc ONETOUCH VERIO LANCETS USE TO TEST YOUR BLOOD SUGAR TWICE A DAY FINGER STICK   OneTouch Verio test strip Generic drug: glucose blood USE WITH METER TWICE PER DAY DX DM TYPE 2   pantoprazole 40 MG tablet Commonly known as: PROTONIX Take 1 tablet (40 mg total) by mouth in the morning.   rosuvastatin 20 MG tablet Commonly known as:  CRESTOR Take 1 tablet (20 mg total) by mouth at bedtime.   Rybelsus 7 MG Tabs Generic drug: Semaglutide Take 1 tablet by mouth daily with breakfast.       Allergies:  Allergies  Allergen Reactions  . Codeine Nausea And Vomiting  . Lisinopril Cough  . Nsaids Other (See Comments)    CKD  . Latex Hives    Past Medical History, Surgical history, Social history, and Family History were reviewed and updated.  Review of Systems: All other 10 point review of systems is negative.   Physical Exam:  vitals were not taken for this visit.   Wt Readings from Last 3 Encounters:  07/30/20 182 lb (82.6 kg)  07/01/20 179 lb (81.2 kg)  07/01/20 180 lb 3.2 oz (81.7 kg)    Ocular: Sclerae unicteric, pupils  equal, round and reactive to light Ear-nose-throat: Oropharynx clear, dentition fair Lymphatic: No cervical or supraclavicular adenopathy Lungs no rales or rhonchi, good excursion bilaterally Heart regular rate and rhythm, no murmur appreciated Abd soft, nontender, positive bowel sounds MSK no focal spinal tenderness, no joint edema Neuro: non-focal, well-oriented, appropriate affect Breasts: Deferred   Lab Results  Component Value Date   WBC 12.1 (H) 09/29/2020   HGB 16.2 09/29/2020   HCT 46.6 09/29/2020   MCV 86.3 09/29/2020   PLT 250 09/29/2020   Lab Results  Component Value Date   FERRITIN 98 07/01/2020   IRON 55 07/01/2020   TIBC 418 (H) 07/01/2020   UIBC 363 07/01/2020   IRONPCTSAT 13 (L) 07/01/2020   Lab Results  Component Value Date   RETICCTPCT 1.8 09/29/2020   RBC 5.50 09/29/2020   RBC 5.40 09/29/2020   No results found for: KPAFRELGTCHN, LAMBDASER, KAPLAMBRATIO No results found for: IGGSERUM, IGA, IGMSERUM No results found for: Odetta Pink, SPEI   Chemistry      Component Value Date/Time   NA 137 07/01/2020 1219   NA 135 03/18/2020 1018   K 4.6 07/01/2020 1219   CL 102 07/01/2020 1219   CO2 25 07/01/2020 1219   BUN 23 07/01/2020 1219   BUN 30 (H) 03/18/2020 1018   CREATININE 1.51 (H) 07/01/2020 1219      Component Value Date/Time   CALCIUM 9.4 07/01/2020 1219   ALKPHOS 51 07/01/2020 1219   AST 28 07/01/2020 1219   ALT 32 07/01/2020 1219   BILITOT 0.3 07/01/2020 1219       Impression and Plan: Marvin Salinas is a very pleasant 75 yo caucasian gentleman with iron deficiency anemia secondary to GI blood loss. As mentioned above, he is doing well and tolerating oral iron nicely.  Iron studies are pending. We will replace only if needed.  Follow-up in 4 months.  He can contact our office with any questions or concerns.   Laverna Peace, NP 4/11/20228:50 AM

## 2020-10-07 ENCOUNTER — Other Ambulatory Visit: Payer: Self-pay | Admitting: Cardiovascular Disease

## 2020-10-10 ENCOUNTER — Other Ambulatory Visit: Payer: Self-pay | Admitting: *Deleted

## 2020-10-10 NOTE — Patient Outreach (Signed)
[facility]) Care Management  10/10/2020  Marvin Salinas 1946-06-21 [id number]  Unsuccessful outreach attempt made to patient. RN Health Coach left HIPAA compliant voicemail message along with her contact information.  Plan: RN Health Coach will call patient within the month of May.  Emelia Loron RN, BSN [place] [phone] [name]

## 2020-10-22 ENCOUNTER — Other Ambulatory Visit (INDEPENDENT_AMBULATORY_CARE_PROVIDER_SITE_OTHER): Payer: Medicare Other

## 2020-10-22 DIAGNOSIS — D509 Iron deficiency anemia, unspecified: Secondary | ICD-10-CM

## 2020-10-22 LAB — CBC WITH DIFFERENTIAL/PLATELET
Basophils Absolute: 0.1 10*3/uL (ref 0.0–0.1)
Basophils Relative: 0.6 % (ref 0.0–3.0)
Eosinophils Absolute: 0.2 10*3/uL (ref 0.0–0.7)
Eosinophils Relative: 2.4 % (ref 0.0–5.0)
HCT: 47.1 % (ref 39.0–52.0)
Hemoglobin: 16 g/dL (ref 13.0–17.0)
Lymphocytes Relative: 14.7 % (ref 12.0–46.0)
Lymphs Abs: 1.5 10*3/uL (ref 0.7–4.0)
MCHC: 34 g/dL (ref 30.0–36.0)
MCV: 88.5 fl (ref 78.0–100.0)
Monocytes Absolute: 0.7 10*3/uL (ref 0.1–1.0)
Monocytes Relative: 6.9 % (ref 3.0–12.0)
Neutro Abs: 7.6 10*3/uL (ref 1.4–7.7)
Neutrophils Relative %: 75.4 % (ref 43.0–77.0)
Platelets: 246 10*3/uL (ref 150.0–400.0)
RBC: 5.32 Mil/uL (ref 4.22–5.81)
RDW: 14.2 % (ref 11.5–15.5)
WBC: 10 10*3/uL (ref 4.0–10.5)

## 2020-10-23 ENCOUNTER — Other Ambulatory Visit: Payer: Self-pay | Admitting: Family Medicine

## 2020-10-23 ENCOUNTER — Ambulatory Visit (INDEPENDENT_AMBULATORY_CARE_PROVIDER_SITE_OTHER): Payer: Medicare Other | Admitting: Family Medicine

## 2020-10-23 ENCOUNTER — Encounter: Payer: Self-pay | Admitting: Family Medicine

## 2020-10-23 ENCOUNTER — Other Ambulatory Visit: Payer: Self-pay

## 2020-10-23 VITALS — BP 139/70 | HR 56 | Temp 97.5°F | Ht 68.0 in | Wt 173.0 lb

## 2020-10-23 DIAGNOSIS — I4891 Unspecified atrial fibrillation: Secondary | ICD-10-CM | POA: Diagnosis not present

## 2020-10-23 DIAGNOSIS — N183 Chronic kidney disease, stage 3 unspecified: Secondary | ICD-10-CM

## 2020-10-23 DIAGNOSIS — I10 Essential (primary) hypertension: Secondary | ICD-10-CM | POA: Diagnosis not present

## 2020-10-23 DIAGNOSIS — E118 Type 2 diabetes mellitus with unspecified complications: Secondary | ICD-10-CM

## 2020-10-23 DIAGNOSIS — I739 Peripheral vascular disease, unspecified: Secondary | ICD-10-CM

## 2020-10-23 DIAGNOSIS — E1149 Type 2 diabetes mellitus with other diabetic neurological complication: Secondary | ICD-10-CM

## 2020-10-23 DIAGNOSIS — I25810 Atherosclerosis of coronary artery bypass graft(s) without angina pectoris: Secondary | ICD-10-CM | POA: Diagnosis not present

## 2020-10-23 DIAGNOSIS — I4892 Unspecified atrial flutter: Secondary | ICD-10-CM | POA: Diagnosis not present

## 2020-10-23 DIAGNOSIS — Z951 Presence of aortocoronary bypass graft: Secondary | ICD-10-CM

## 2020-10-23 DIAGNOSIS — Z8673 Personal history of transient ischemic attack (TIA), and cerebral infarction without residual deficits: Secondary | ICD-10-CM

## 2020-10-23 DIAGNOSIS — E1122 Type 2 diabetes mellitus with diabetic chronic kidney disease: Secondary | ICD-10-CM

## 2020-10-23 DIAGNOSIS — I472 Ventricular tachycardia: Secondary | ICD-10-CM

## 2020-10-23 DIAGNOSIS — I252 Old myocardial infarction: Secondary | ICD-10-CM

## 2020-10-23 DIAGNOSIS — I2510 Atherosclerotic heart disease of native coronary artery without angina pectoris: Secondary | ICD-10-CM

## 2020-10-23 DIAGNOSIS — E785 Hyperlipidemia, unspecified: Secondary | ICD-10-CM | POA: Diagnosis not present

## 2020-10-23 DIAGNOSIS — Z794 Long term (current) use of insulin: Secondary | ICD-10-CM | POA: Diagnosis not present

## 2020-10-23 DIAGNOSIS — I779 Disorder of arteries and arterioles, unspecified: Secondary | ICD-10-CM

## 2020-10-23 DIAGNOSIS — N1831 Chronic kidney disease, stage 3a: Secondary | ICD-10-CM

## 2020-10-23 DIAGNOSIS — I4729 Other ventricular tachycardia: Secondary | ICD-10-CM

## 2020-10-23 LAB — LIPID PANEL
Cholesterol: 137 mg/dL (ref 0–200)
HDL: 44.3 mg/dL (ref >39.00)
LDL Cholesterol: 68 mg/dL (ref 0–99)
NonHDL: 93.04
Total CHOL/HDL Ratio: 3
Triglycerides: 125 mg/dL (ref 0.0–149.0)
VLDL: 25 mg/dL (ref 0.0–40.0)

## 2020-10-23 LAB — POCT GLYCOSYLATED HEMOGLOBIN (HGB A1C)
HbA1c POC (<> result, manual entry): 6.4 % (ref 4.0–5.6)
HbA1c, POC (controlled diabetic range): 6.4 % (ref 0.0–7.0)
HbA1c, POC (prediabetic range): 6.4 % (ref 5.7–6.4)
Hemoglobin A1C: 6.4 % — AB (ref 4.0–5.6)

## 2020-10-23 LAB — VITAMIN D 25 HYDROXY (VIT D DEFICIENCY, FRACTURES): VITD: 34.97 ng/mL (ref 30.00–100.00)

## 2020-10-23 LAB — TSH: TSH: 0.73 u[IU]/mL (ref 0.35–4.50)

## 2020-10-23 MED ORDER — OLMESARTAN MEDOXOMIL 20 MG PO TABS: 30.0000 mg | ORAL_TABLET | Freq: Every day | ORAL | 1 refills | Status: DC

## 2020-10-23 MED ORDER — INSULIN GLARGINE 100 UNIT/ML ~~LOC~~ SOLN: SUBCUTANEOUS | 5 refills | Status: DC

## 2020-10-23 MED ORDER — RYBELSUS 7 MG PO TABS: 1.0000 | ORAL_TABLET | Freq: Every day | ORAL | 1 refills | Status: DC

## 2020-10-23 MED ORDER — ROSUVASTATIN CALCIUM 20 MG PO TABS: 20.0000 mg | ORAL_TABLET | Freq: Every day | ORAL | 3 refills | Status: DC

## 2020-10-23 MED ORDER — EZETIMIBE 10 MG PO TABS: 10.0000 mg | ORAL_TABLET | Freq: Every day | ORAL | 3 refills | Status: DC

## 2020-10-23 MED ORDER — GLIPIZIDE 10 MG PO TABS: 10.0000 mg | ORAL_TABLET | Freq: Two times a day (BID) | ORAL | 1 refills | Status: DC

## 2020-10-23 NOTE — Telephone Encounter (Signed)
Please contact the insurance for dose overide/prior authorization.  Patient needs an increase in dose to Benicar 30 mg and there is not a 30 mg tablet to prescribe.  Therefore he would need to take 1-1/2 tabs of the Benicar 20 mg daily. Please make sure patient has enough medication and is not waiting on this prior Auth to have his meds.  If he needs the Benicar 20 mg tablet take 1 a day prescription provide that to him while we wait on prior Auth.  Thanks

## 2020-10-23 NOTE — Patient Instructions (Addendum)
Next appt 4 mos.  A1c looks great at 6.4!!! Nephrology should call to get you established.   Only change to meds is increase olmesartan to 1.5 tabs a day.   Temporomandibular Joint Syndrome  Temporomandibular joint syndrome (TMJ syndrome) is a condition that causes pain in the temporomandibular joints. These joints are located near your ears and allow your jaw to open and close. For people with TMJ syndrome, chewing, biting, or other movements of the jaw can be difficult or painful. TMJ syndrome is often mild and goes away within a few weeks. However, sometimes the condition becomes a long-term (chronic) problem. What are the causes? This condition may be caused by:  Grinding your teeth or clenching your jaw. Some people do this when they are under stress.  Arthritis.  Injury to the jaw.  Head or neck injury.  Teeth or dentures that are not aligned well. In some cases, the cause of TMJ syndrome may not be known. What are the signs or symptoms? The most common symptom of this condition is an aching pain on the side of the head in the area of the TMJ. Other symptoms may include:  Pain when moving your jaw, such as when chewing or biting.  Being unable to open your jaw all the way.  Making a clicking sound when you open your mouth.  Headache.  Earache.  Neck or shoulder pain. How is this diagnosed? This condition may be diagnosed based on:  Your symptoms and medical history.  A physical exam. Your health care provider may check the range of motion of your jaw.  Imaging tests, such as X-rays or an MRI. You may also need to see your dentist, who will determine if your teeth and jaw are lined up correctly. How is this treated? TMJ syndrome often goes away on its own. If treatment is needed, the options may include:  Eating soft foods and applying ice or heat.  Medicines to relieve pain or inflammation.  Medicines or massage to relax the muscles.  A splint, bite  plate, or mouthpiece to prevent teeth grinding or jaw clenching.  Relaxation techniques or counseling to help reduce stress.  A therapy for pain in which an electrical current is applied to the nerves through the skin (transcutaneous electrical nerve stimulation).  Acupuncture. This is sometimes helpful to relieve pain.  Jaw surgery. This is rarely needed. Follow these instructions at home: Eating and drinking  Eat a soft diet if you are having trouble chewing.  Avoid foods that require a lot of chewing. Do not chew gum. General instructions  Take over-the-counter and prescription medicines only as told by your health care provider.  If directed, put ice on the painful area. ? Put ice in a plastic bag. ? Place a towel between your skin and the bag. ? Leave the ice on for 20 minutes, 2-3 times a day.  Apply a warm, wet cloth (warm compress) to the painful area as directed.  Massage your jaw area and do any jaw stretching exercises as told by your health care provider.  If you were given a splint, bite plate, or mouthpiece, wear it as told by your health care provider.  Keep all follow-up visits as told by your health care provider. This is important.   Contact a health care provider if:  You are having trouble eating.  You have new or worsening symptoms. Get help right away if:  Your jaw locks open or closed. Summary  Temporomandibular joint syndrome (  TMJ syndrome) is a condition that causes pain in the temporomandibular joints. These joints are located near your ears and allow your jaw to open and close.  TMJ syndrome is often mild and goes away within a few weeks. However, sometimes the condition becomes a long-term (chronic) problem.  Symptoms include an aching pain on the side of the head in the area of the TMJ, pain when chewing or biting, and being unable to open your jaw all the way. You may also make a clicking sound when you open your mouth.  TMJ syndrome often  goes away on its own. If treatment is needed, it may include medicines to relieve pain, reduce inflammation, or relax the muscles. A splint, bite plate, or mouthpiece may also be used to prevent teeth grinding or jaw clenching. This information is not intended to replace advice given to you by your health care provider. Make sure you discuss any questions you have with your health care provider. Document Revised: 08/19/2017 Document Reviewed: 07/19/2017 Elsevier Patient Education  2021 [employer].

## 2020-10-23 NOTE — Progress Notes (Signed)
This visit occurred during the SARS-CoV-2 public health emergency.  Safety protocols were in place, including screening questions prior to the visit, additional usage of staff PPE, and extensive cleaning of exam room while observing appropriate contact time as indicated for disinfecting solutions.    Patient ID: Marvin Salinas, male  DOB: 1946/02/15, 75 y.o.   MRN: [id number] Patient Care Team    Relationship Specialty Notifications Start End  Ma Hillock, DO PCP - General Family Medicine  01/24/19   [name], Wonda Cheng, MD PCP - Cardiology Cardiology  05/07/20   Chesley Mires, MD Consulting Physician Pulmonary Disease  01/26/19   Laurena Slimmer, MD Referring Physician Nephrology  01/26/19   Constance Haw, MD Consulting Physician Cardiology  01/26/19   Calvert Cantor, MD Consulting Physician Ophthalmology  01/26/19   Garvin Fila, MD Consulting Physician Neurology  01/26/19   Love, Alyson Locket, MD Consulting Physician Neurology  01/26/19   [name], Wonda Cheng, MD Consulting Physician Cardiology  04/30/20   Michiel Cowboy, [facility]   06/17/20     Chief Complaint  Patient presents with  . Follow-up    [facility];   . Diabetes    Pt is fasting    Subjective: Marvin Salinas is a 75 y.o.  male present for  [facility] Type 2 diabetes mellitus with stage 3 chronic kidney disease, with long-term current use of insulin ([place]) Pt reports compliance  with Lantus 25-30 units in the morning and 40 units nightly, Rybelsus 7 mg daily and glipizide 10/10 mg bid.  He has had some elevations after a meal up in the 240.  He reports his fasting blood glucose is approximately 90-110.  He is watching his diet. PNA series: Pneumonia series completed Flu shot: completed  04/29/2020 (recommneded yearly) Foot exam: Referred to podiatry 12/2018-completed UTD Eye exam: 03/2020, Dr. Gwenevere Salinas changes noted A1c: 8.9>>> 13.7 (08/22/2018)>>11 (01/24/2019)>>10.8 08/20/2019> 10.5>10.3> 6.4  today  Atrial fibrillation with RVR (HCC)/Essential hypertension/Ischemic heart disease/ S/P CABG x 3/Paroxysmal atrial flutter (HCC)/PAD (peripheral artery disease) (HCC)/Nonsustained ventricular tachycardia (HCC)/HLD/NSTEMI/h/o stroke/CKD 3/chronic anticoagulation Patient has a history of severe 3-vessel coronary artery disease with chronic total occlusions of the proximal/mid LAD, mid/distal left circumflex/and proximal RCA.  Moderate caliber D1 and distal RPDA demonstrate 70-80% stenosis.  Moderately elevated left ventricular filling pressure.  Coronary artery stents and CABG have been completed in the past.  He also has a history of cerebral ischemia and TIAs. Pt reports compliance with  Crestor, Eliquis, Benicar 20, metoprolol 12.5 mg twice daily, Zetia, tikosyn. Blood pressures ranges at home WNL.   Patient reports he is feeling really good. Pt is prescribed statin. Diet: Heart healthy-low-sodium diet encouraged RF: Hypertension, hyperlipidemia, former smoker, STEMI, stroke, diabetes, family history, CAD, CVD, PAD, bilateral carotid artery stenosis, TIA   Depression screen [facility] 2/9 04/29/2020 02/15/2020 01/24/2019  Decreased Interest 0 0 0  Down, Depressed, Hopeless 0 0 0  PHQ - 2 Score 0 0 0  Altered sleeping - - 0  Tired, decreased energy - - 0  Change in appetite - - 0  Feeling bad or failure about yourself  - - 0  Trouble concentrating - - 0  Moving slowly or fidgety/restless - - 0  Suicidal thoughts - - 0  PHQ-9 Score - - 0  Difficult doing work/chores - - Not difficult at all  Some recent data might be hidden   No flowsheet data found.      Fall  Risk  07/11/2020 04/29/2020 01/24/2019  Falls in the past year? 0 0 0  Number falls in past yr: 0 0 0  Injury with Fall? 0 0 0  Follow up Falls prevention discussed;Falls evaluation completed;Education provided Falls evaluation completed -    Immunization History  Administered Date(s) Administered  . Fluad Quad(high Dose 65+)  02/12/2019, 04/29/2020  . Influenza Split 05/08/2013, 03/28/2017  . Influenza, High Dose Seasonal PF 05/08/2013, 03/28/2017, 05/02/2018  . Influenza,inj,Quad PF,6+ Mos 04/21/2016  . Moderna Sars-Covid-2 Vaccination 08/27/2019, 10/03/2019  . Pneumococcal Conjugate-13 07/13/2016  . Pneumococcal Polysaccharide-23 02/21/2018  . Tdap 02/15/2013  . Zoster 06/22/2015    No exam data present  Past Medical History:  Diagnosis Date  . Acute kidney injury superimposed on chronic kidney disease ([place]) 10/12/2017  . Atrial fibrillation with RVR ([place]) 10/27/2017  . Back pain   . Basilar artery stenosis    on chronic Plavix  . Benign prostatic hypertrophy without urinary obstruction 04/17/2014  . Carotid artery disease ([place]) 10/06/2010   Carotid US 04/2019: Bilat ICA 40-59; L subclavian stenosis  // Carotid US 11/21: Bilat ICA 40-59; bilateral subclavian stenosis   . Cerebral vascular disease    with prior TIA's; followed by Dr. Erling Cruz  . Depression, neurotic 11/21/2013  . Diabetes mellitus    on insulin  . Enthesopathy of ankle and tarsus 09/04/2007   Overview:  Metatarsalgia   . Enthesopathy of ankle and tarsus 09/04/2007   Overview:  Metatarsalgia  Formatting of this note might be different from the original. Metatarsalgia  10/1 IMO update  . History of renal calculi   . Hyperlipidemia   . Hypertension   . Ischemic heart disease    prior PCI to RCA in 1989. S/P PCI to LAD and OM in 1992. S/P PCI to first DX in 2000. S/P CABG x 3 in May 2011  . Left hip pain 01/03/2020  . OSA (obstructive sleep apnea) 01/11/2018   AHI 18.1 and SaO2 low 73%  . PAD (peripheral artery disease) ([place]) 11/18/2017   ABIs/Arterial US 04/2019: R 1.21; L 0.66 // R SFA 30-49, stable > 50 CIA and EIA stenosis; L > 50 CIA stenosis (likely represents severe stenosis or short segment occlusion)  . Peripheral neuropathy   . Pneumonia Sept 2012  . Sacroiliac joint dysfunction of left side 01/03/2020  . Stroke ([place]) 04/16/2001    small right cerebellar infarct on 04/16/2001 at that time he was found to have proximal left vertebral artery, proximal left common carotid artery and both external carotid artery stenosis as well as intracranial stenosis involving mid basilar artery- 07/2011 add questionable TIA   Allergies  Allergen Reactions  . Codeine Nausea And Vomiting  . Lisinopril Cough  . Nsaids Other (See Comments)    CKD  . Latex Hives   Past Surgical History:  Procedure Laterality Date  .  NASAL ENDOSCOPY  01/11/2020   chronic rhinitis w/o evidence of acute sinusitis, bilateral inferior turbinate hypertrophy  . ABDOMINAL AORTOGRAM W/LOWER EXTREMITY Bilateral 05/30/2019   Procedure: ABDOMINAL AORTOGRAM W/LOWER EXTREMITY;  Surgeon: Wellington Hampshire, MD;  Location: [place] [facility];  Service: Cardiovascular;  Laterality: Bilateral;  . ANGIOPLASTY  1989   right coronary artery  . ANGIOPLASTY  1992   LAD and OM  . ANGIOPLASTY  1998   First DX  . BRAIN SURGERY     on prior records  . CORONARY ARTERY BYPASS GRAFT  11/12/2009   LIMA to LAD, SVG to OM and  SVG to RCA  . CORONARY STENT PLACEMENT  2000   Stent to LAD/Circumflex with angioplasty to first diagonal   . LEFT HEART CATH AND CORS/GRAFTS ANGIOGRAPHY N/A 10/13/2017   Procedure: LEFT HEART CATH AND CORS/GRAFTS ANGIOGRAPHY;  Surgeon: Nelva Bush, MD;  Location: [facility] [facility];  Service: Cardiovascular;  Laterality: N/A;   Family History  Problem Relation Age of Onset  . Depression Mother   . Early death Mother   . Kidney disease Mother   . Ovarian cancer Mother   . Hypertension Father   . Heart disease Father   . Heart attack Father   . Asthma Brother   . Diabetes Brother   . Stroke Other        Uncle  . Diabetes Sister   . Colon cancer Neg Hx   . Rectal cancer Neg Hx   . Stomach cancer Neg Hx   . Esophageal cancer Neg Hx    Social History   Social History Narrative   Marital status/children/pets: divorced.   Education/employment:  retired, Patient has his GED. 9th grade education.    Safety:      -smoke alarm in the home:Yes     - wears seatbelt: Yes   Patient is right handed.   Patient does not drink any caffeine.    Allergies as of 10/23/2020      Reactions   Codeine Nausea And Vomiting   Lisinopril Cough   Nsaids Other (See Comments)   CKD   Latex Hives      Medication List       Accurate as of Oct 23, 2020  1:19 PM. If you have any questions, ask your nurse or doctor.        STOP taking these medications   pantoprazole 40 MG tablet Commonly known as: PROTONIX Stopped by: Howard Pouch, DO     TAKE these medications   BD Insulin Syringe U/F 31G X 5/16" 0.5 ML Misc Generic drug: Insulin Syringe-Needle U-100 USE TO INJECT INSULIN TWICE DAILY AS DIRECTED   cholecalciferol 25 MCG (1000 UNIT) tablet Commonly known as: VITAMIN D3 Take 1,000 Units by mouth daily.   co-enzyme Q-10 30 MG capsule Take 30 mg by mouth daily.   dofetilide 125 MCG capsule Commonly known as: TIKOSYN TAKE 1 CAPSULE BY MOUTH TWICE A DAY   Eliquis 5 MG Tabs tablet Generic drug: apixaban TAKE 1 TABLET (5 MG TOTAL) BY MOUTH 2 (TWO) TIMES DAILY   ezetimibe 10 MG tablet Commonly known as: ZETIA Take 1 tablet (10 mg total) by mouth daily.   ferrous sulfate 325 (65 FE) MG tablet Take 325 mg by mouth daily with breakfast.   fluticasone 50 MCG/ACT nasal spray Commonly known as: FLONASE Place 2 sprays into both nostrils daily.   GENTEAL OP Place 1 drop into both eyes daily as needed (dry eyes).   glipiZIDE 10 MG tablet Commonly known as: GLUCOTROL Take 1 tablet (10 mg total) by mouth 2 (two) times daily before a meal.   insulin glargine 100 UNIT/ML injection Commonly known as: LANTUS 25-30 units subcu injection in the morning and 40 units subcu injection before bed. What changed: additional instructions Changed by: Howard Pouch, DO   loratadine 10 MG tablet Commonly known as: CLARITIN Take 10 mg by mouth daily  as needed for allergies.   metoprolol tartrate 25 MG tablet Commonly known as: LOPRESSOR TAKE 0.5 TABLETS (12.5 MG TOTAL) BY MOUTH 2 (TWO) TIMES DAILY.   nitroGLYCERIN 0.4 MG SL  tablet Commonly known as: NITROSTAT PLACE 1 TABLET (0.4 MG TOTAL) UNDER THE TONGUE EVERY 5 (FIVE) MINUTES AS NEEDED FOR CHEST PAIN.   olmesartan 20 MG tablet Commonly known as: BENICAR Take 1.5 tablets (30 mg total) by mouth daily. What changed: how much to take Changed by: Howard Pouch, DO   OneTouch Delica Plus [id number] Misc ONETOUCH VERIO LANCETS USE TO TEST YOUR BLOOD SUGAR TWICE A DAY FINGER STICK   OneTouch Verio test strip Generic drug: glucose blood USE WITH METER TWICE PER DAY DX DM TYPE 2   rosuvastatin 20 MG tablet Commonly known as: CRESTOR Take 1 tablet (20 mg total) by mouth at bedtime.   Rybelsus 7 MG Tabs Generic drug: Semaglutide Take 1 tablet by mouth daily with breakfast.       All past medical history, surgical history, allergies, family history, immunizations andmedications were updated in the EMR today and reviewed under the history and medication portions of their EMR.      ROS: 14 pt review of systems performed and negative (unless mentioned in an HPI)  Objective: BP 139/70   Pulse (!) 56   Temp (!) 97.5 F (36.4 C) (Oral)   Ht 5\' 8"  (1.727 m)   Wt 173 lb (78.5 kg)   SpO2 98%   BMI 26.30 kg/m  Gen: Afebrile. No acute distress.  Nontoxic, pleasant male. HENT: AT. [place].  No cough.  No hoarseness. Eyes:Pupils Equal Round Reactive to light, Extraocular movements intact,  Conjunctiva without redness, discharge or icterus. Neck/lymp/endocrine: Supple, no lymphadenopathy, no thyromegaly CV: RRR no murmur, no edema Chest: CTAB, no wheeze or crackles Skin: No rashes, purpura or petechiae.  Neuro:  Normal gait. PERLA. EOMi. Alert. Oriented x3 Psych: Normal affect, dress and demeanor. Normal speech. Normal thought content and judgment.   Results for orders placed or  performed in visit on 10/23/20 (from the past 24 hour(s))  POCT HgB A1C     Status: Abnormal   Collection Time: 10/23/20 10:42 AM  Result Value Ref Range   Hemoglobin A1C 6.4 (A) 4.0 - 5.6 %   HbA1c POC (<> result, manual entry) 6.4 4.0 - 5.6 %   HbA1c, POC (prediabetic range) 6.4 5.7 - 6.4 %   HbA1c, POC (controlled diabetic range) 6.4 0.0 - 7.0 %    Assessment/plan: AUDEN TATAR is a 75 y.o. male present for est care Type 2 diabetes mellitus with stage 3 chronic kidney disease, with long-term current use of insulin ([place]) -Patient was encouraged to follow a diabetic diet and make an effort to get routine exercise.  -Continue Lantus to 25-30 units at day and 40 units in the night.   - Given his cardiac history and his CKD oral options are limited-insurance does not cover Jardiance either. -Continue glipizide 10/10 mg twice daily before meals today. - continue Rybelsus 7 mg daily - Medicines tried: Lantus and Ozempic (side effects) - He declined nutrition referral PNA series: Pneumonia series completed Flu shot: completed11/02/2020 (recommneded yearly) Foot exam: Referred to podiatry 12/2018-completed EGD Eye exam: 03/2020, Dr. Gwenevere Salinas changes noted A1c: 8.9>>> 13.7 (08/22/2018)>>11 (01/24/2019)>>10.8 08/20/2019> 10.5>10.3> 6.4 today!!!!    Atrial fibrillation with RVR (HCC)/Essential hypertension/Ischemic heart disease/ S/P CABG x 3/Paroxysmal atrial flutter (HCC)/PAD (peripheral artery disease) (HCC)/Nonsustained ventricular tachycardia (HCC)/HLD/NSTEMI/h/o stroke/PAD - Borderline BP for his chronic conditions.   - Patient aware goal is less than 130/80.  He reports all lung pressures were above this goal. - Prescribed by cardio: tikosyn, Eliquis (recently changed from [place]) -Continue  Crestor 20 mg daily  -Continue zeita. - Increase Benicar 20 mg daily to 30 mg a day. - continue metoprolol 12.5 mg twice daily  - He has stopped smoking !!!!  Chronic kidney disease (CKD),  stage III (moderate) (HCC) Renally dose meds. Avoid NSAIDs. PTH/calcium/vitamin D collected today reviewed recent CBC and CMP Referral placed to new nephrology.   Return in about 4 months (around 02/23/2021) for [place] (30 min).  Orders Placed This Encounter  Procedures  . Vitamin D (25 hydroxy)  . PTH, Intact and Calcium  . TSH  . Lipid panel  . Ambulatory referral to Nephrology  . POCT HgB A1C   Meds ordered this encounter  Medications  . olmesartan (BENICAR) 20 MG tablet    Sig: Take 1.5 tablets (30 mg total) by mouth daily.    Dispense:  135 tablet    Refill:  1  . ezetimibe (ZETIA) 10 MG tablet    Sig: Take 1 tablet (10 mg total) by mouth daily.    Dispense:  90 tablet    Refill:  3    MUST HAVE OV FOR FURTHER REFILLS  . glipiZIDE (GLUCOTROL) 10 MG tablet    Sig: Take 1 tablet (10 mg total) by mouth 2 (two) times daily before a meal.    Dispense:  180 tablet    Refill:  1  . rosuvastatin (CRESTOR) 20 MG tablet    Sig: Take 1 tablet (20 mg total) by mouth at bedtime.    Dispense:  90 tablet    Refill:  3  . Semaglutide (RYBELSUS) 7 MG TABS    Sig: Take 1 tablet by mouth daily with breakfast.    Dispense:  90 tablet    Refill:  1  . insulin glargine (LANTUS) 100 UNIT/ML injection    Sig: 25-30 units subcu injection in the morning and 40 units subcu injection before bed.    Dispense:  70 mL    Refill:  5    DC ALL other lantus scripts please. Dose change. thank you.    Referral Orders     Ambulatory referral to Nephrology   Note is dictated utilizing voice recognition software. Although note has been proof read prior to signing, occasional typographical errors still can be missed. If any questions arise, please do not hesitate to call for verification.  Electronically signed by: Howard Pouch, DO [place]

## 2020-10-24 ENCOUNTER — Telehealth: Payer: Self-pay | Admitting: Family Medicine

## 2020-10-24 ENCOUNTER — Other Ambulatory Visit (INDEPENDENT_AMBULATORY_CARE_PROVIDER_SITE_OTHER): Payer: Medicare Other

## 2020-10-24 ENCOUNTER — Ambulatory Visit: Payer: Medicare Other | Admitting: Gastroenterology

## 2020-10-24 ENCOUNTER — Encounter: Payer: Self-pay | Admitting: Gastroenterology

## 2020-10-24 DIAGNOSIS — K5520 Angiodysplasia of colon without hemorrhage: Secondary | ICD-10-CM | POA: Diagnosis not present

## 2020-10-24 DIAGNOSIS — D509 Iron deficiency anemia, unspecified: Secondary | ICD-10-CM | POA: Diagnosis not present

## 2020-10-24 DIAGNOSIS — R634 Abnormal weight loss: Secondary | ICD-10-CM | POA: Diagnosis not present

## 2020-10-24 LAB — CBC
HCT: 45.3 % (ref 39.0–52.0)
Hemoglobin: 15.7 g/dL (ref 13.0–17.0)
MCHC: 34.6 g/dL (ref 30.0–36.0)
MCV: 88.2 fl (ref 78.0–100.0)
Platelets: 254 10*3/uL (ref 150.0–400.0)
RBC: 5.14 Mil/uL (ref 4.22–5.81)
RDW: 14 % (ref 11.5–15.5)
WBC: 9.3 10*3/uL (ref 4.0–10.5)

## 2020-10-24 LAB — BASIC METABOLIC PANEL
BUN: 32 mg/dL — ABNORMAL HIGH (ref 6–23)
CO2: 28 mEq/L (ref 19–32)
Calcium: 10 mg/dL (ref 8.4–10.5)
Chloride: 105 mEq/L (ref 96–112)
Creatinine, Ser: 1.55 mg/dL — ABNORMAL HIGH (ref 0.40–1.50)
GFR: 43.46 mL/min — ABNORMAL LOW (ref >60.00)
Glucose, Bld: 122 mg/dL — ABNORMAL HIGH (ref 70–99)
Potassium: 4.7 mEq/L (ref 3.5–5.1)
Sodium: 141 mEq/L (ref 135–145)

## 2020-10-24 LAB — PTH, INTACT AND CALCIUM
Calcium: 9.9 mg/dL (ref 8.6–10.3)
PTH: 17 pg/mL (ref 16–77)

## 2020-10-24 MED ORDER — FERROUS SULFATE 325 (65 FE) MG PO TABS: 325.0000 mg | ORAL_TABLET | ORAL | Status: DC

## 2020-10-24 NOTE — Progress Notes (Signed)
Review of pertinent gastrointestinal problems: 1.  Iron deficiency on anemia, Hemoccult positive (2 out of 3 samples) hemoglobin 8.21 April 2020.  Dual anticoagulation with Eliquis and Plavix.  Very possibly his anemia was from the polyps in his colon as he was having a minor intermittent rectal bleeding.  He was also having pink urine periodically.  This overt bleeding is completely stopped after he came off Plavix and the polyps were removed.  Colonoscopy November 2021 Dr. Ardis Hughs found and removed 5 adenomas from his colon ranging in size from 4 mm to 10 mm.  He was recommended to have repeat examination in 3 years.  EGD November 2021 Dr. Ardis Hughs found very mild nonspecific gastritis.  Biopsies from duodenum were normal.  Capsule endoscopy February 2022 showed 3 small to medium AVMs throughout his small bowel.  Most proximal 2 are probably within the reach of enteroscopy, the more distal one was far out of reach of enteroscopy.  None of them were actively bleeding.  HPI: [name] whom I last saw 2 or 3 months ago here in the office.  Blood work May 2022 normal TSH, normal vitamin D, normal lipid panel, hemoglobin A1c 6.4, hemoglobin 16, MCV 88.5,  His weight is down 9 pounds since his last office visit here 2 months ago.  He believes this is because of his Rybelsus which he started about 1 month ago.  I actually do not see it is a common side effect on my drug index.  He has no abdominal pains.  He has been taking iron faithfully on a daily basis for the last 2 months.  This does cause some mild constipation and indigestion.  His stools have been somewhat dark since starting the iron but never overtly bloody.   ROS: complete GI ROS as described in HPI, all other review negative.  Constitutional:  No unintentional weight loss   Past Medical History:  Diagnosis Date  . Acute kidney injury superimposed on chronic kidney disease ([place]) 10/12/2017  . Atrial  fibrillation with RVR ([place]) 10/27/2017  . Back pain   . Basilar artery stenosis    on chronic Plavix  . Benign prostatic hypertrophy without urinary obstruction 04/17/2014  . Carotid artery disease ([place]) 10/06/2010   Carotid US 04/2019: Bilat ICA 40-59; L subclavian stenosis  // Carotid US 11/21: Bilat ICA 40-59; bilateral subclavian stenosis   . Cerebral vascular disease    with prior TIA's; followed by Dr. Erling Cruz  . Depression, neurotic 11/21/2013  . Diabetes mellitus    on insulin  . Enthesopathy of ankle and tarsus 09/04/2007   Overview:  Metatarsalgia   . Enthesopathy of ankle and tarsus 09/04/2007   Overview:  Metatarsalgia  Formatting of this note might be different from the original. Metatarsalgia  10/1 IMO update  . History of renal calculi   . Hyperlipidemia   . Hypertension   . Ischemic heart disease    prior PCI to RCA in 1989. S/P PCI to LAD and OM in 1992. S/P PCI to first DX in 2000. S/P CABG x 3 in May 2011  . Left hip pain 01/03/2020  . OSA (obstructive sleep apnea) 01/11/2018   AHI 18.1 and SaO2 low 73%  . PAD (peripheral artery disease) ([place]) 11/18/2017   ABIs/Arterial US 04/2019: R 1.21; L 0.66 // R SFA 30-49, stable > 50 CIA and EIA stenosis; L > 50 CIA stenosis (likely represents severe stenosis or short segment occlusion)  . Peripheral neuropathy   .  Pneumonia Sept 2012  . Sacroiliac joint dysfunction of left side 01/03/2020  . Stroke ([place]) 04/16/2001   small right cerebellar infarct on 04/16/2001 at that time he was found to have proximal left vertebral artery, proximal left common carotid artery and both external carotid artery stenosis as well as intracranial stenosis involving mid basilar artery- 07/2011 add questionable TIA    Past Surgical History:  Procedure Laterality Date  .  NASAL ENDOSCOPY  01/11/2020   chronic rhinitis w/o evidence of acute sinusitis, bilateral inferior turbinate hypertrophy  . ABDOMINAL AORTOGRAM W/LOWER EXTREMITY Bilateral 05/30/2019    Procedure: ABDOMINAL AORTOGRAM W/LOWER EXTREMITY;  Surgeon: Wellington Hampshire, MD;  Location: [facility];  Service: Cardiovascular;  Laterality: Bilateral;  . ANGIOPLASTY  1989   right coronary artery  . ANGIOPLASTY  1992   LAD and OM  . ANGIOPLASTY  1998   First DX  . BRAIN SURGERY     on prior records  . CORONARY ARTERY BYPASS GRAFT  11/12/2009   LIMA to LAD, SVG to OM and SVG to RCA  . CORONARY STENT PLACEMENT  2000   Stent to LAD/Circumflex with angioplasty to first diagonal   . LEFT HEART CATH AND CORS/GRAFTS ANGIOGRAPHY N/A 10/13/2017   Procedure: LEFT HEART CATH AND CORS/GRAFTS ANGIOGRAPHY;  Surgeon: Nelva Bush, MD;  Location: [facility];  Service: Cardiovascular;  Laterality: N/A;    Current Outpatient Medications  Medication Sig Dispense Refill  . BD INSULIN SYRINGE U/F 31G X 5/16" 0.5 ML MISC USE TO INJECT INSULIN TWICE DAILY AS DIRECTED 200 each 1  . Carboxymethylcell-Hypromellose (GENTEAL OP) Place 1 drop into both eyes daily as needed (dry eyes).    . cholecalciferol (VITAMIN D3) 25 MCG (1000 UNIT) tablet Take 1,000 Units by mouth daily.    Marland Kitchen co-enzyme Q-10 30 MG capsule Take 30 mg by mouth daily.    Marland Kitchen dofetilide (TIKOSYN) 125 MCG capsule TAKE 1 CAPSULE BY MOUTH TWICE A DAY 180 capsule 1  . ELIQUIS 5 MG TABS tablet TAKE 1 TABLET (5 MG TOTAL) BY MOUTH 2 (TWO) TIMES DAILY 180 tablet 1  . ezetimibe (ZETIA) 10 MG tablet Take 1 tablet (10 mg total) by mouth daily. 90 tablet 3  . ferrous sulfate 325 (65 FE) MG tablet Take 325 mg by mouth daily with breakfast.    . fluticasone (FLONASE) 50 MCG/ACT nasal spray Place 2 sprays into both nostrils daily.    Marland Kitchen glipiZIDE (GLUCOTROL) 10 MG tablet Take 1 tablet (10 mg total) by mouth 2 (two) times daily before a meal. 180 tablet 1  . insulin glargine (LANTUS) 100 UNIT/ML injection 25-30 units subcu injection in the morning and 40 units subcu injection before bed. 70 mL 5  . Lancets (ONETOUCH DELICA PLUS [id number]) MISC  ONETOUCH VERIO LANCETS USE TO TEST YOUR BLOOD SUGAR TWICE A DAY FINGER STICK    . loratadine (CLARITIN) 10 MG tablet Take 10 mg by mouth daily as needed for allergies.    . metoprolol tartrate (LOPRESSOR) 25 MG tablet TAKE 0.5 TABLETS (12.5 MG TOTAL) BY MOUTH 2 (TWO) TIMES DAILY. 90 tablet 1  . olmesartan (BENICAR) 20 MG tablet TAKE 1 AND 1/2 TABLETS DAILY BY MOUTH 135 tablet 1  . ONETOUCH VERIO test strip USE WITH METER TWICE PER DAY DX DM TYPE 2    . rosuvastatin (CRESTOR) 20 MG tablet Take 1 tablet (20 mg total) by mouth at bedtime. 90 tablet 3  . Semaglutide (RYBELSUS) 7 MG TABS Take  1 tablet by mouth daily with breakfast. 90 tablet 1  . nitroGLYCERIN (NITROSTAT) 0.4 MG SL tablet PLACE 1 TABLET (0.4 MG TOTAL) UNDER THE TONGUE EVERY 5 (FIVE) MINUTES AS NEEDED FOR CHEST PAIN. (Patient not taking: Reported on 10/24/2020) 25 tablet 12   No current facility-administered medications for this visit.    Allergies as of 10/24/2020 - Review Complete 10/24/2020  Allergen Reaction Noted  . Codeine Nausea And Vomiting 10/06/2010  . Lisinopril Cough 10/06/2010  . Nsaids Other (See Comments) 07/13/2016  . Latex Hives 10/06/2010    Family History  Problem Relation Age of Onset  . Depression Mother   . Early death Mother   . Kidney disease Mother   . Ovarian cancer Mother   . Hypertension Father   . Heart disease Father   . Heart attack Father   . Asthma Brother   . Diabetes Brother   . Stroke Other        Uncle  . Diabetes Sister   . Colon cancer Neg Hx   . Rectal cancer Neg Hx   . Stomach cancer Neg Hx   . Esophageal cancer Neg Hx     Social History   Socioeconomic History  . Marital status: Divorced    Spouse name: Not on file  . Number of children: 0  . Years of education: GED  . Highest education level: Not on file  Occupational History  . Occupation: retired  Tobacco Use  . Smoking status: Former Smoker    Packs/day: 0.25    Years: 54.00    Pack years: 13.50    Types:  Cigarettes    Quit date: 03/12/2020    Years since quitting: 0.6  . Smokeless tobacco: Never Used  Vaping Use  . Vaping Use: Never used  Substance and Sexual Activity  . Alcohol use: Yes  . Drug use: No  . Sexual activity: Not Currently    Partners: Female  Other Topics Concern  . Not on file  Social History Narrative   Marital status/children/pets: divorced.   Education/employment: retired, Patient has his GED. 9th grade education.    Safety:      -smoke alarm in the home:Yes     - wears seatbelt: Yes   Patient is right handed.   Patient does not drink any caffeine.   Social Determinants of Health   Financial Resource Strain: Low Risk   . Difficulty of Paying Living Expenses: Not hard at all  Food Insecurity: No Food Insecurity  . Worried About Charity fundraiser in the Last Year: Never true  . Ran Out of Food in the Last Year: Never true  Transportation Needs: No Transportation Needs  . Lack of Transportation (Medical): No  . Lack of Transportation (Non-Medical): No  Physical Activity: Not on file  Stress: Not on file  Social Connections: Not on file  Intimate Partner Violence: Not At Risk  . Fear of Current or Ex-Partner: No  . Emotionally Abused: No  . Physically Abused: No  . Sexually Abused: No     Physical Exam: BP 140/60 (BP Location: Left Arm, Patient Position: Sitting, Cuff Size: Normal)   Pulse 64   Ht 5' 6.75" (1.695 m) Comment: height measured without shoes  Wt 173 lb 2 oz (78.5 kg)   BMI 27.32 kg/m  Constitutional: generally well-appearing Psychiatric: alert and oriented x3 Abdomen: soft, nontender, nondistended, no obvious ascites, no peritoneal signs, normal bowel sounds No peripheral edema noted in lower extremities  Assessment  and plan: 75 y.o. male with iron deficiency anemia, small bowel AVMs, weight loss  First his iron deficiency anemia is well controlled as long as he takes iron orally.  He does have 3 small to medium sized AVMs in his  small intestine which were nonbleeding.  Capsule endoscopy 2 months ago.  Those certainly may be the source of his iron deficiency and anemia.  He understands that as long as iron supplements continue to keep his blood counts at a healthy, normal level then there is no need to try to find and eradicate these small bowel AVMs.  I do have some slight concern that there might be a different process going on causing his anemia such as an occult neoplasm.  It would not be from the GI tract because he has had a very thorough evaluation but since he has lost some weight I recommended CT scan abdomen pelvis to exclude other occult neoplastic sites.  I think that is unlikely however.  His daily iron supplement is bothering his GI tract with constipation and some indigestion so I recommended he start taking it every other day instead of daily.  I suspect this will be adequate iron supplementation still and we will repeat his CBC in 3 months.  Please see the "Patient Instructions" section for addition details about the plan.  Owens Loffler, MD [facility] 10/24/2020, 9:31 AM   Total time on date of encounter was 30 minutes (this included time spent preparing to see the patient reviewing records; obtaining and/or reviewing separately obtained history; performing a medically appropriate exam and/or evaluation; counseling and educating the patient and family if present; ordering medications, tests or procedures if applicable; and documenting clinical information in the health record).

## 2020-10-24 NOTE — Telephone Encounter (Signed)
Spoke with pt regarding medication and informed him that we will complete the PA. Pt confirmed that he has about 2 weeks worth of pills and new dosage

## 2020-10-24 NOTE — Patient Instructions (Addendum)
If you are age 76 or older, your body mass index should be between 23-30. Your Body mass index is 27.32 kg/m. If this is out of the aforementioned range listed, please consider follow up with your Primary Care Provider.  You have been scheduled for a CT scan of the abdomen and pelvis at [facility] ([address]---this is in the same building as [employer]).   You are scheduled on 10-28-2020 at 2:30pm. You should arrive 15 minutes prior to your appointment time for registration. Please follow the written instructions below on the day of your exam:  WARNING: IF YOU ARE ALLERGIC TO IODINE/X-RAY DYE, PLEASE NOTIFY RADIOLOGY IMMEDIATELY AT [phone]! YOU WILL BE GIVEN A 13 HOUR PREMEDICATION PREP.  1) Do not eat or drink anything after 10:30am (4 hours prior to your test) 2) You have been given 2 bottles of oral contrast to drink. The solution may taste better if refrigerated, but do NOT add ice or any other liquid to this solution. Shake well before drinking.    Drink 1 bottle of contrast @ 12:30pm (2 hours prior to your exam)  Drink 1 bottle of contrast @ 1:30pm (1 hour prior to your exam)  You may take any medications as prescribed with a small amount of water, if necessary. If you take any of the following medications: METFORMIN, GLUCOPHAGE, GLUCOVANCE, AVANDAMET, RIOMET, FORTAMET, [place] MET, JANUMET, GLUMETZA or METAGLIP, you MAY be asked to HOLD this medication 48 hours AFTER the exam.  The purpose of you drinking the oral contrast is to aid in the visualization of your intestinal tract. The contrast solution may cause some diarrhea. Depending on your individual set of symptoms, you may also receive an intravenous injection of x-ray contrast/dye. Plan on being at [facility] for 30 minutes or longer, depending on the type of exam you are having performed.  This test typically takes 30-45 minutes to complete.  If you have any questions regarding your exam  or if you need to reschedule, you may call the CT department at [phone] between the hours of 8:00 am and 5:00 pm, Monday-Friday.  ________________________________________________________________________  Due to recent changes in healthcare laws, you may see the results of your imaging and laboratory studies on MyChart before your provider has had a chance to review them.  We understand that in some cases there may be results that are confusing or concerning to you. Not all laboratory results come back in the same time frame and the provider may be waiting for multiple results in order to interpret others.  Please give [place] 48 hours in order for your provider to thoroughly review all the results before contacting the office for clarification of your results.   CHANGE: Iron take one tablet every other day.  Your provider has requested that you go to the basement level for lab work before leaving today. Press "B" on the elevator. The lab is located at the first door on the left as you exit the elevator.  Your provider has requested that you go to the basement level for lab work in 3 months (August 2022). Press "B" on the elevator. The lab is located at the first door on the left as you exit the elevator.  Thank you for entrusting me with your care and choosing [facility].  Dr Ardis Hughs

## 2020-10-24 NOTE — Telephone Encounter (Signed)
Patient states that his insurance is requesting a 90 day supply of his Olmesartan (one tablet instead of 1 1/2 tablets). He said they will only pay for the 90 day supply. Please call him at [phone] if you have any questions.

## 2020-10-24 NOTE — Telephone Encounter (Signed)
Marvin Salinas  (12:37 PM)     Patient states that his insurance is requesting a 90 day supply of his Olmesartan (one tablet instead of 1 1/2 tablets). He said they will only pay for the 90 day supply. Please call him at [phone] if you have any questions.

## 2020-10-24 NOTE — Telephone Encounter (Signed)
Please see other encounter.

## 2020-10-28 ENCOUNTER — Ambulatory Visit (INDEPENDENT_AMBULATORY_CARE_PROVIDER_SITE_OTHER)
Admission: RE | Admit: 2020-10-28 | Discharge: 2020-10-28 | Disposition: A | Discharge status: Home / Self Care | Payer: Medicare Other | Source: Ambulatory Visit | Attending: Gastroenterology | Admitting: Gastroenterology

## 2020-10-28 ENCOUNTER — Other Ambulatory Visit: Payer: Self-pay

## 2020-10-28 DIAGNOSIS — R109 Unspecified abdominal pain: Secondary | ICD-10-CM | POA: Diagnosis not present

## 2020-10-28 DIAGNOSIS — D509 Iron deficiency anemia, unspecified: Secondary | ICD-10-CM

## 2020-10-28 DIAGNOSIS — R634 Abnormal weight loss: Secondary | ICD-10-CM

## 2020-10-28 MED ORDER — IOHEXOL 300 MG/ML  SOLN
80.0000 mL | Freq: Once | INTRAMUSCULAR | Status: AC | PRN
  Administered 2020-10-28: 80 mL via INTRAVENOUS

## 2020-10-28 NOTE — Telephone Encounter (Signed)
PA completed thru pt's insurance with [facility], reference # provided is [id number]. Dx provided (I10)primary hypertension, PAD(I73.9), Atrial fibrillation with RVR (I48.91). Qty 135 for 90 d supply, day limit is 1.5 tablets. We will be provided with update via fax in the next few days.

## 2020-11-06 ENCOUNTER — Other Ambulatory Visit: Payer: Self-pay | Admitting: *Deleted

## 2020-11-06 NOTE — Patient Instructions (Addendum)
Goals Addressed            This Visit's Progress   . (THN) Monitor and Manage My Blood Sugar       Timeframe:  Long-Range Goal Priority:  High Start Date: 04/03/20                            Expected End Date: 04/19/21                     Follow Up Date 02/17/21   - check blood sugar at prescribed times - check blood sugar if I feel it is too high or too low - enter blood sugar readings and medication or insulin into daily log - take the blood sugar log to all doctor visits - take the blood sugar meter to all doctor visits  -Encouraged continuation of limiting sugar and carbohydrates in diet -Encouraged continuation of increasing physical activity by push mowing his yard and tending to his vegetable garden  Why is this important?   Checking your blood sugar at home helps to keep it from getting very high or very low.  Writing the results in a diary or log helps the doctor know how to care for you.  Your blood sugar log should have the time, date and the results.  Also, write down the amount of insulin or other medicine that you take.  Other information, like what you ate, exercise done and how you were feeling, will also be helpful.     Notes: 07/11/20: Patient reports checking his blood sugar twice daily and as needed. He does record his values in a log. Patient states he is motivated to improving his diabetes control. He reports maintaining a diabetic diet, increasing his physical activity by walking and doing house projects. He has a goal of lowering his A1c to 7 for his overall health.   Updated 11/06/20: Patient continues to check his blood sugar twice daily and record his values. He states his diabetes is currently under control. Patient's last A1c was 6.4 on 10/23/20. Patient states he has loss 12 pounds and is working towards losing another 12 pounds by continuing to eat healthy and by staying physically active. Nurse congratulated patient for being dedicated to his healthy and  wellness.

## 2020-11-06 NOTE — Patient Outreach (Signed)
[facility]) Care Management  [place]  11/06/2020   Marvin Salinas [birth year] [id number]  Subjective: Successful telephone outreach call to patient. HIPAA identifiers obtained. Patient reports he is doing very well. He states that his diabetes is currently under control, his last A1c was 6.4 on 10/23/20, he is eating a healthier diet, and he is staying physically active by push mowing his yard and tending to his vegetable garden. Patient explains that his Hgb is currently 15.7 and that oncology will continue to follow-up. He does not have any iron infusions scheduled and will continue to take ferrous sulfate by mouth as maintenance. Patient shared that he has loss 12 pounds and is working towards losing another 12 pounds. Nurse congratulated patient for being dedicated to his health and wellness. Patient did not have any further questions or concerns today.   Encounter Medications:  Outpatient Encounter Medications as of 11/06/2020  Medication Sig Note  . BD INSULIN SYRINGE U/F 31G X 5/16" 0.5 ML MISC USE TO INJECT INSULIN TWICE DAILY AS DIRECTED   . Carboxymethylcell-Hypromellose (GENTEAL OP) Place 1 drop into both eyes daily as needed (dry eyes).   . cholecalciferol (VITAMIN D3) 25 MCG (1000 UNIT) tablet Take 1,000 Units by mouth daily.   Marland Kitchen co-enzyme Q-10 30 MG capsule Take 30 mg by mouth daily.   Marland Kitchen dofetilide (TIKOSYN) 125 MCG capsule TAKE 1 CAPSULE BY MOUTH TWICE A DAY   . ELIQUIS 5 MG TABS tablet TAKE 1 TABLET (5 MG TOTAL) BY MOUTH 2 (TWO) TIMES DAILY   . ezetimibe (ZETIA) 10 MG tablet Take 1 tablet (10 mg total) by mouth daily.   . ferrous sulfate 325 (65 FE) MG tablet Take 1 tablet (325 mg total) by mouth every other day.   . fluticasone (FLONASE) 50 MCG/ACT nasal spray Place 2 sprays into both nostrils daily.   Marland Kitchen glipiZIDE (GLUCOTROL) 10 MG tablet Take 1 tablet (10 mg total) by mouth 2 (two) times daily before a meal.   . insulin glargine (LANTUS) 100 UNIT/ML  injection 25-30 units subcu injection in the morning and 40 units subcu injection before bed.   . Lancets (ONETOUCH DELICA PLUS [id number]) MISC ONETOUCH VERIO LANCETS USE TO TEST YOUR BLOOD SUGAR TWICE A DAY FINGER STICK   . loratadine (CLARITIN) 10 MG tablet Take 10 mg by mouth daily as needed for allergies.   . metoprolol tartrate (LOPRESSOR) 25 MG tablet TAKE 0.5 TABLETS (12.5 MG TOTAL) BY MOUTH 2 (TWO) TIMES DAILY.   . nitroGLYCERIN (NITROSTAT) 0.4 MG SL tablet PLACE 1 TABLET (0.4 MG TOTAL) UNDER THE TONGUE EVERY 5 (FIVE) MINUTES AS NEEDED FOR CHEST PAIN. 10/24/2020: On hand  . olmesartan (BENICAR) 20 MG tablet TAKE 1 AND 1/2 TABLETS DAILY BY MOUTH   . ONETOUCH VERIO test strip USE WITH METER TWICE PER DAY DX DM TYPE 2   . rosuvastatin (CRESTOR) 20 MG tablet Take 1 tablet (20 mg total) by mouth at bedtime.   . Semaglutide (RYBELSUS) 7 MG TABS Take 1 tablet by mouth daily with breakfast.    No facility-administered encounter medications on file as of 11/06/2020.    Functional Status:  No flowsheet data found.  Fall/Depression Screening: Fall Risk  11/06/2020 07/11/2020 04/29/2020  Falls in the past year? 0 0 0  Number falls in past yr: 0 0 0  Injury with Fall? 0 0 0  Follow up Falls prevention discussed;Education provided;Falls evaluation completed Falls prevention discussed;Falls evaluation completed;Education provided Falls evaluation completed  PHQ 2/9 Scores 04/29/2020 02/15/2020 01/24/2019  PHQ - 2 Score 0 0 0  PHQ- 9 Score - - 0    Assessment:  Goals Addressed            This Visit's Progress   . (THN) Monitor and Manage My Blood Sugar       Timeframe:  Long-Range Goal Priority:  High Start Date: 04/03/20                            Expected End Date: 04/19/21                     Follow Up Date 02/17/21   - check blood sugar at prescribed times - check blood sugar if I feel it is too high or too low - enter blood sugar readings and medication or insulin into daily log -  take the blood sugar log to all doctor visits - take the blood sugar meter to all doctor visits  -Encouraged continuation of limiting sugar and carbohydrates in diet -Encouraged continuation of increasing physical activity by push mowing his yard and tending to his vegetable garden  Why is this important?   Checking your blood sugar at home helps to keep it from getting very high or very low.  Writing the results in a diary or log helps the doctor know how to care for you.  Your blood sugar log should have the time, date and the results.  Also, write down the amount of insulin or other medicine that you take.  Other information, like what you ate, exercise done and how you were feeling, will also be helpful.     Notes: 07/11/20: Patient reports checking his blood sugar twice daily and as needed. He does record his values in a log. Patient states he is motivated to improving his diabetes control. He reports maintaining a diabetic diet, increasing his physical activity by walking and doing house projects. He has a goal of lowering his A1c to 7 for his overall health.   Updated 11/06/20: Patient continues to check his blood sugar twice daily and record his values. He states his diabetes is currently under control. Patient's last A1c was 6.4 on 10/23/20. Patient states he has loss 12 pounds and is working towards losing another 12 pounds by continuing to eat healthy and by staying physically active. Nurse congratulated patient for being dedicated to his healthy and wellness.      Plan: RN Health Coach will send PCP a quarterly update and will call patient within the month of August. Follow-up:  Patient agrees to Care Plan and Follow-up.  Emelia Loron RN, BSN [place] [phone] [email or web address]

## 2020-11-19 DIAGNOSIS — R809 Proteinuria, unspecified: Secondary | ICD-10-CM | POA: Diagnosis not present

## 2020-11-19 DIAGNOSIS — I480 Paroxysmal atrial fibrillation: Secondary | ICD-10-CM | POA: Diagnosis not present

## 2020-11-19 DIAGNOSIS — N189 Chronic kidney disease, unspecified: Secondary | ICD-10-CM | POA: Diagnosis not present

## 2020-11-19 DIAGNOSIS — E559 Vitamin D deficiency, unspecified: Secondary | ICD-10-CM | POA: Diagnosis not present

## 2020-11-19 DIAGNOSIS — I129 Hypertensive chronic kidney disease with stage 1 through stage 4 chronic kidney disease, or unspecified chronic kidney disease: Secondary | ICD-10-CM | POA: Diagnosis not present

## 2020-11-19 DIAGNOSIS — Z79899 Other long term (current) drug therapy: Secondary | ICD-10-CM | POA: Diagnosis not present

## 2020-11-19 DIAGNOSIS — I517 Cardiomegaly: Secondary | ICD-10-CM | POA: Diagnosis not present

## 2020-11-19 DIAGNOSIS — E1122 Type 2 diabetes mellitus with diabetic chronic kidney disease: Secondary | ICD-10-CM | POA: Diagnosis not present

## 2020-11-19 DIAGNOSIS — D508 Other iron deficiency anemias: Secondary | ICD-10-CM | POA: Diagnosis not present

## 2020-11-19 DIAGNOSIS — E1129 Type 2 diabetes mellitus with other diabetic kidney complication: Secondary | ICD-10-CM | POA: Diagnosis not present

## 2020-11-20 ENCOUNTER — Other Ambulatory Visit (HOSPITAL_COMMUNITY): Payer: Self-pay | Admitting: Nephrology

## 2020-11-20 DIAGNOSIS — I129 Hypertensive chronic kidney disease with stage 1 through stage 4 chronic kidney disease, or unspecified chronic kidney disease: Secondary | ICD-10-CM

## 2020-11-22 ENCOUNTER — Other Ambulatory Visit: Payer: Self-pay | Admitting: Cardiovascular Disease

## 2020-11-22 ENCOUNTER — Other Ambulatory Visit: Payer: Self-pay | Admitting: Family Medicine

## 2020-11-24 DIAGNOSIS — R809 Proteinuria, unspecified: Secondary | ICD-10-CM | POA: Diagnosis not present

## 2020-11-24 DIAGNOSIS — I129 Hypertensive chronic kidney disease with stage 1 through stage 4 chronic kidney disease, or unspecified chronic kidney disease: Secondary | ICD-10-CM | POA: Diagnosis not present

## 2020-11-24 DIAGNOSIS — E1129 Type 2 diabetes mellitus with other diabetic kidney complication: Secondary | ICD-10-CM | POA: Diagnosis not present

## 2020-11-24 DIAGNOSIS — Z79899 Other long term (current) drug therapy: Secondary | ICD-10-CM | POA: Diagnosis not present

## 2020-11-24 DIAGNOSIS — N189 Chronic kidney disease, unspecified: Secondary | ICD-10-CM | POA: Diagnosis not present

## 2020-11-24 DIAGNOSIS — E1122 Type 2 diabetes mellitus with diabetic chronic kidney disease: Secondary | ICD-10-CM | POA: Diagnosis not present

## 2020-11-24 DIAGNOSIS — E559 Vitamin D deficiency, unspecified: Secondary | ICD-10-CM | POA: Diagnosis not present

## 2020-12-02 ENCOUNTER — Ambulatory Visit (HOSPITAL_COMMUNITY)
Admission: RE | Admit: 2020-12-02 | Discharge: 2020-12-02 | Disposition: A | Discharge status: Home / Self Care | Payer: Medicare Other | Source: Ambulatory Visit | Attending: Nephrology | Admitting: Nephrology

## 2020-12-02 ENCOUNTER — Other Ambulatory Visit: Payer: Self-pay

## 2020-12-02 DIAGNOSIS — N189 Chronic kidney disease, unspecified: Secondary | ICD-10-CM | POA: Diagnosis not present

## 2020-12-02 DIAGNOSIS — I129 Hypertensive chronic kidney disease with stage 1 through stage 4 chronic kidney disease, or unspecified chronic kidney disease: Secondary | ICD-10-CM | POA: Diagnosis not present

## 2020-12-09 ENCOUNTER — Telehealth: Payer: Self-pay | Admitting: *Deleted

## 2020-12-09 NOTE — Telephone Encounter (Signed)
Called to reschedule appointment from 01/30/21 to 02/04/21 - patient confirmed

## 2020-12-10 DIAGNOSIS — Z23 Encounter for immunization: Secondary | ICD-10-CM | POA: Diagnosis not present

## 2020-12-15 ENCOUNTER — Encounter: Payer: Self-pay | Admitting: Family

## 2020-12-18 ENCOUNTER — Ambulatory Visit (HOSPITAL_COMMUNITY)
Admission: RE | Admit: 2020-12-18 | Discharge: 2020-12-18 | Disposition: A | Discharge status: Home / Self Care | Payer: Medicare Other | Source: Ambulatory Visit | Attending: Cardiovascular Disease | Admitting: Cardiovascular Disease

## 2020-12-18 ENCOUNTER — Ambulatory Visit (HOSPITAL_BASED_OUTPATIENT_CLINIC_OR_DEPARTMENT_OTHER)
Admission: RE | Admit: 2020-12-18 | Discharge: 2020-12-18 | Disposition: A | Discharge status: Home / Self Care | Payer: Medicare Other | Source: Ambulatory Visit | Attending: Cardiovascular Disease | Admitting: Cardiovascular Disease

## 2020-12-18 ENCOUNTER — Other Ambulatory Visit (HOSPITAL_COMMUNITY): Payer: Self-pay | Admitting: Cardiovascular Disease

## 2020-12-18 ENCOUNTER — Other Ambulatory Visit: Payer: Self-pay

## 2020-12-18 DIAGNOSIS — I739 Peripheral vascular disease, unspecified: Secondary | ICD-10-CM | POA: Diagnosis not present

## 2020-12-18 DIAGNOSIS — Z95828 Presence of other vascular implants and grafts: Secondary | ICD-10-CM

## 2020-12-19 DIAGNOSIS — R809 Proteinuria, unspecified: Secondary | ICD-10-CM | POA: Diagnosis not present

## 2020-12-19 DIAGNOSIS — D472 Monoclonal gammopathy: Secondary | ICD-10-CM | POA: Diagnosis not present

## 2020-12-19 DIAGNOSIS — N189 Chronic kidney disease, unspecified: Secondary | ICD-10-CM | POA: Diagnosis not present

## 2020-12-19 DIAGNOSIS — E1122 Type 2 diabetes mellitus with diabetic chronic kidney disease: Secondary | ICD-10-CM | POA: Diagnosis not present

## 2020-12-19 DIAGNOSIS — I129 Hypertensive chronic kidney disease with stage 1 through stage 4 chronic kidney disease, or unspecified chronic kidney disease: Secondary | ICD-10-CM | POA: Diagnosis not present

## 2020-12-19 DIAGNOSIS — E1129 Type 2 diabetes mellitus with other diabetic kidney complication: Secondary | ICD-10-CM | POA: Diagnosis not present

## 2020-12-28 ENCOUNTER — Other Ambulatory Visit: Payer: Self-pay | Admitting: Family Medicine

## 2021-01-19 ENCOUNTER — Telehealth: Payer: Self-pay

## 2021-01-19 NOTE — Telephone Encounter (Signed)
Called patient to remind him that he will need to have CBC per Dr Ardis Hughs in the month of August.  Patient states he will come this week for lab work.  Patient agreed to plan and verbalized understanding.  No further questions.

## 2021-01-20 ENCOUNTER — Other Ambulatory Visit (INDEPENDENT_AMBULATORY_CARE_PROVIDER_SITE_OTHER): Payer: Medicare Other

## 2021-01-20 ENCOUNTER — Other Ambulatory Visit: Payer: Self-pay

## 2021-01-20 DIAGNOSIS — D509 Iron deficiency anemia, unspecified: Secondary | ICD-10-CM

## 2021-01-20 DIAGNOSIS — K5520 Angiodysplasia of colon without hemorrhage: Secondary | ICD-10-CM

## 2021-01-20 LAB — CBC
HCT: 44.5 % (ref 39.0–52.0)
Hemoglobin: 15.3 g/dL (ref 13.0–17.0)
MCHC: 34.3 g/dL (ref 30.0–36.0)
MCV: 89.9 fl (ref 78.0–100.0)
Platelets: 251 10*3/uL (ref 150.0–400.0)
RBC: 4.94 Mil/uL (ref 4.22–5.81)
RDW: 13.7 % (ref 11.5–15.5)
WBC: 8.5 10*3/uL (ref 4.0–10.5)

## 2021-01-21 ENCOUNTER — Other Ambulatory Visit: Payer: Self-pay | Admitting: Cardiovascular Disease

## 2021-01-26 ENCOUNTER — Other Ambulatory Visit: Payer: Self-pay

## 2021-01-26 DIAGNOSIS — D509 Iron deficiency anemia, unspecified: Secondary | ICD-10-CM

## 2021-01-30 ENCOUNTER — Other Ambulatory Visit: Payer: Medicare Other

## 2021-01-30 ENCOUNTER — Ambulatory Visit: Payer: Medicare Other | Admitting: Family

## 2021-02-04 ENCOUNTER — Encounter: Payer: Self-pay | Admitting: Family

## 2021-02-04 ENCOUNTER — Other Ambulatory Visit: Payer: Self-pay

## 2021-02-04 ENCOUNTER — Inpatient Hospital Stay (HOSPITAL_BASED_OUTPATIENT_CLINIC_OR_DEPARTMENT_OTHER): Payer: Medicare Other | Admitting: Family

## 2021-02-04 ENCOUNTER — Telehealth: Payer: Self-pay | Admitting: *Deleted

## 2021-02-04 ENCOUNTER — Inpatient Hospital Stay: Payer: Medicare Other | Attending: Family

## 2021-02-04 VITALS — BP 181/81 | HR 59 | Temp 97.7°F | Resp 16 | Wt 170.0 lb

## 2021-02-04 DIAGNOSIS — D500 Iron deficiency anemia secondary to blood loss (chronic): Secondary | ICD-10-CM | POA: Insufficient documentation

## 2021-02-04 DIAGNOSIS — K922 Gastrointestinal hemorrhage, unspecified: Secondary | ICD-10-CM | POA: Insufficient documentation

## 2021-02-04 DIAGNOSIS — Z79899 Other long term (current) drug therapy: Secondary | ICD-10-CM | POA: Insufficient documentation

## 2021-02-04 LAB — CMP (CANCER CENTER ONLY)
ALT: 20 U/L (ref 0–44)
AST: 24 U/L (ref 15–41)
Albumin: 4.3 g/dL (ref 3.5–5.0)
Alkaline Phosphatase: 54 U/L (ref 38–126)
Anion gap: 8 (ref 5–15)
BUN: 21 mg/dL (ref 8–23)
CO2: 26 mmol/L (ref 22–32)
Calcium: 9.9 mg/dL (ref 8.9–10.3)
Chloride: 105 mmol/L (ref 98–111)
Creatinine: 1.35 mg/dL — ABNORMAL HIGH (ref 0.61–1.24)
GFR, Estimated: 55 mL/min — ABNORMAL LOW (ref >60)
Glucose, Bld: 143 mg/dL — ABNORMAL HIGH (ref 70–99)
Potassium: 4.6 mmol/L (ref 3.5–5.1)
Sodium: 139 mmol/L (ref 135–145)
Total Bilirubin: 0.4 mg/dL (ref 0.3–1.2)
Total Protein: 6.7 g/dL (ref 6.5–8.1)

## 2021-02-04 LAB — CBC WITH DIFFERENTIAL (CANCER CENTER ONLY)
Abs Immature Granulocytes: 0.03 10*3/uL (ref 0.00–0.07)
Basophils Absolute: 0.1 10*3/uL (ref 0.0–0.1)
Basophils Relative: 1 %
Eosinophils Absolute: 0.2 10*3/uL (ref 0.0–0.5)
Eosinophils Relative: 3 %
HCT: 44.3 % (ref 39.0–52.0)
Hemoglobin: 15.4 g/dL (ref 13.0–17.0)
Immature Granulocytes: 0 %
Lymphocytes Relative: 14 %
Lymphs Abs: 1.3 10*3/uL (ref 0.7–4.0)
MCH: 31.4 pg (ref 26.0–34.0)
MCHC: 34.8 g/dL (ref 30.0–36.0)
MCV: 90.2 fL (ref 80.0–100.0)
Monocytes Absolute: 0.6 10*3/uL (ref 0.1–1.0)
Monocytes Relative: 7 %
Neutro Abs: 6.9 10*3/uL (ref 1.7–7.7)
Neutrophils Relative %: 75 %
Platelet Count: 231 10*3/uL (ref 150–400)
RBC: 4.91 MIL/uL (ref 4.22–5.81)
RDW: 13.1 % (ref 11.5–15.5)
WBC Count: 9.1 10*3/uL (ref 4.0–10.5)
nRBC: 0 % (ref 0.0–0.2)

## 2021-02-04 LAB — IRON AND TIBC
Iron: 98 ug/dL (ref 42–163)
Saturation Ratios: 28 % (ref 20–55)
TIBC: 345 ug/dL (ref 202–409)
UIBC: 247 ug/dL (ref 117–376)

## 2021-02-04 LAB — FERRITIN: Ferritin: 94 ng/mL (ref 24–336)

## 2021-02-04 LAB — RETICULOCYTES
Immature Retic Fract: 10.4 % (ref 2.3–15.9)
RBC.: 5 MIL/uL (ref 4.22–5.81)
Retic Count, Absolute: 74 10*3/uL (ref 19.0–186.0)
Retic Ct Pct: 1.5 % (ref 0.4–3.1)

## 2021-02-04 NOTE — Telephone Encounter (Signed)
Per 02/04/21 los - gave upcoming appointments - confirmed - print calendar

## 2021-02-04 NOTE — Progress Notes (Signed)
Hematology and Oncology Follow Up Visit  Marvin Salinas [id number] [birth year] 75 y.o. 02/04/2021   Principle Diagnosis:  Iron deficiency anemia secondary to intermittent GI blood loss   Current Therapy:        IV iron as indicated    Interim History:  Marvin Salinas is here today for follow-up. He is doing well but notes some occasional fatigue.  No blood loss noted. No bruising or petechiae.  No fever, chills, n/v, cough, rash, dizziness, SOB, chest pain, palpitations, abdominal pain or changes in bowel or bladder habits.  He is taking Mirilax daily to help prevent constipation.  Neuropathy in his hands and feet is unchanged.  No swelling in his extremities at this time. He has generalized aches and pains with arthritis.  No falls or syncope to report.  He has a good appetite and is staying well hydrated. Her weight is stable.   ECOG Performance Status: 1 - Symptomatic but completely ambulatory  Medications:  Allergies as of 02/04/2021       Reactions   Codeine Nausea And Vomiting   Lisinopril Cough   Nsaids Other (See Comments)   CKD   Latex Hives        Medication List        Accurate as of February 04, 2021  9:14 AM. If you have any questions, ask your nurse or doctor.          ascorbic acid 500 MG tablet Commonly known as: VITAMIN C Take by mouth.   BD Insulin Syringe U/F 31G X 5/16" 0.5 ML Misc Generic drug: Insulin Syringe-Needle U-100 USE TO INJECT INSULIN TWICE DAILY AS DIRECTED   cholecalciferol 25 MCG (1000 UNIT) tablet Commonly known as: VITAMIN D3 Take 1,000 Units by mouth daily.   co-enzyme Q-10 30 MG capsule Take 30 mg by mouth daily.   dofetilide 125 MCG capsule Commonly known as: TIKOSYN TAKE 1 CAPSULE BY MOUTH TWICE A DAY   Eliquis 5 MG Tabs tablet Generic drug: apixaban TAKE 1 TABLET (5 MG TOTAL) BY MOUTH 2 (TWO) TIMES DAILY   ezetimibe 10 MG tablet Commonly known as: ZETIA Take 1 tablet (10 mg total) by mouth daily.   ferrous  sulfate 325 (65 FE) MG tablet Take 1 tablet (325 mg total) by mouth every other day.   fluticasone 50 MCG/ACT nasal spray Commonly known as: FLONASE Place 2 sprays into both nostrils daily.   GENTEAL OP Place 1 drop into both eyes daily as needed (dry eyes).   glipiZIDE 10 MG tablet Commonly known as: GLUCOTROL Take 1 tablet (10 mg total) by mouth 2 (two) times daily before a meal.   insulin glargine 100 UNIT/ML injection Commonly known as: LANTUS 25-30 units subcu injection in the morning and 40 units subcu injection before bed.   loratadine 10 MG tablet Commonly known as: CLARITIN Take 10 mg by mouth daily as needed for allergies.   Magnesium 250 MG Tabs Take by mouth.   metoprolol tartrate 25 MG tablet Commonly known as: LOPRESSOR TAKE ONE-HALF TABLET BY  MOUTH TWICE DAILY   nitroGLYCERIN 0.4 MG SL tablet Commonly known as: NITROSTAT PLACE 1 TABLET (0.4 MG TOTAL) UNDER THE TONGUE EVERY 5 (FIVE) MINUTES AS NEEDED FOR CHEST PAIN.   olmesartan 20 MG tablet Commonly known as: BENICAR TAKE 1 AND 1/2 TABLETS DAILY BY MOUTH   OneTouch Delica Plus 0000000 Misc ONETOUCH VERIO LANCETS USE TO TEST YOUR BLOOD SUGAR TWICE A DAY FINGER STICK   OneTouch Verio test  strip Generic drug: glucose blood USE WITH METER TWICE PER DAY DX DM TYPE 2   rosuvastatin 20 MG tablet Commonly known as: CRESTOR Take 1 tablet (20 mg total) by mouth at bedtime.   Rybelsus 7 MG Tabs Generic drug: Semaglutide Take 1 tablet by mouth daily with breakfast.        Allergies:  Allergies  Allergen Reactions   Codeine Nausea And Vomiting   Lisinopril Cough   Nsaids Other (See Comments)    CKD   Latex Hives    Past Medical History, Surgical history, Social history, and Family History were reviewed and updated.  Review of Systems: All other 10 point review of systems is negative.   Physical Exam:  weight is 170 lb (77.1 kg). His oral temperature is 97.7 F (36.5 C). His blood pressure  is 181/81 (abnormal) and his pulse is 59 (abnormal). His respiration is 16 and oxygen saturation is 100%.   Wt Readings from Last 3 Encounters:  02/04/21 170 lb (77.1 kg)  10/24/20 173 lb 2 oz (78.5 kg)  10/23/20 173 lb (78.5 kg)    Ocular: Sclerae unicteric, pupils equal, round and reactive to light Ear-nose-throat: Oropharynx clear, dentition fair Lymphatic: No cervical or supraclavicular adenopathy Lungs no rales or rhonchi, good excursion bilaterally Heart regular rate and rhythm, no murmur appreciated Abd soft, nontender, positive bowel sounds MSK no focal spinal tenderness, no joint edema Neuro: non-focal, well-oriented, appropriate affect Breasts: Deferred   Lab Results  Component Value Date   WBC 9.1 02/04/2021   HGB 15.4 02/04/2021   HCT 44.3 02/04/2021   MCV 90.2 02/04/2021   PLT 231 02/04/2021   Lab Results  Component Value Date   FERRITIN 85 09/29/2020   IRON 92 09/29/2020   TIBC 373 09/29/2020   UIBC 281 09/29/2020   IRONPCTSAT 25 09/29/2020   Lab Results  Component Value Date   RETICCTPCT 1.5 02/04/2021   RBC 5.00 02/04/2021   RBC 4.91 02/04/2021   No results found for: KPAFRELGTCHN, LAMBDASER, KAPLAMBRATIO No results found for: IGGSERUM, IGA, IGMSERUM No results found for: Odetta Pink, SPEI   Chemistry      Component Value Date/Time   NA 139 02/04/2021 0832   NA 135 03/18/2020 1018   K 4.6 02/04/2021 0832   CL 105 02/04/2021 0832   CO2 26 02/04/2021 0832   BUN 21 02/04/2021 0832   BUN 30 (H) 03/18/2020 1018   CREATININE 1.35 (H) 02/04/2021 0832      Component Value Date/Time   CALCIUM 9.9 02/04/2021 0832   ALKPHOS 54 02/04/2021 0832   AST 24 02/04/2021 0832   ALT 20 02/04/2021 0832   BILITOT 0.4 02/04/2021 0832       Impression and Plan: Marvin Salinas is a very pleasant 75 yo caucasian gentleman with iron deficiency anemia secondary to GI blood loss.  Iron studies are pending. He will  continue his daily oral iron supplement.  Follow-up in 6 months.  He can contact our office with any questions or concerns.   Laverna Peace, NP 8/17/20229:14 AM

## 2021-02-06 ENCOUNTER — Other Ambulatory Visit: Payer: Self-pay | Admitting: *Deleted

## 2021-02-06 NOTE — Patient Outreach (Signed)
[facility]) Care Management  02/06/2021  GAVON CHRISTON [birth year] [id number]  Unsuccessful outreach attempt made to patient. RN Health Coach left HIPAA compliant voicemail message along with her contact information.  Plan: RN Health Coach will call patient within the month of September.  Emelia Loron RN, BSN [place] [phone] [email or web address]

## 2021-02-19 DIAGNOSIS — R809 Proteinuria, unspecified: Secondary | ICD-10-CM | POA: Diagnosis not present

## 2021-02-19 DIAGNOSIS — N189 Chronic kidney disease, unspecified: Secondary | ICD-10-CM | POA: Diagnosis not present

## 2021-02-19 DIAGNOSIS — D472 Monoclonal gammopathy: Secondary | ICD-10-CM | POA: Diagnosis not present

## 2021-02-19 DIAGNOSIS — E1122 Type 2 diabetes mellitus with diabetic chronic kidney disease: Secondary | ICD-10-CM | POA: Diagnosis not present

## 2021-02-19 DIAGNOSIS — E1129 Type 2 diabetes mellitus with other diabetic kidney complication: Secondary | ICD-10-CM | POA: Diagnosis not present

## 2021-02-25 ENCOUNTER — Telehealth: Payer: Self-pay

## 2021-02-25 ENCOUNTER — Other Ambulatory Visit: Payer: Self-pay | Admitting: Family

## 2021-02-25 DIAGNOSIS — E1129 Type 2 diabetes mellitus with other diabetic kidney complication: Secondary | ICD-10-CM | POA: Diagnosis not present

## 2021-02-25 DIAGNOSIS — I129 Hypertensive chronic kidney disease with stage 1 through stage 4 chronic kidney disease, or unspecified chronic kidney disease: Secondary | ICD-10-CM | POA: Diagnosis not present

## 2021-02-25 DIAGNOSIS — R778 Other specified abnormalities of plasma proteins: Secondary | ICD-10-CM

## 2021-02-25 DIAGNOSIS — D472 Monoclonal gammopathy: Secondary | ICD-10-CM | POA: Diagnosis not present

## 2021-02-25 DIAGNOSIS — E1122 Type 2 diabetes mellitus with diabetic chronic kidney disease: Secondary | ICD-10-CM | POA: Diagnosis not present

## 2021-02-25 DIAGNOSIS — N189 Chronic kidney disease, unspecified: Secondary | ICD-10-CM | POA: Diagnosis not present

## 2021-02-25 DIAGNOSIS — R809 Proteinuria, unspecified: Secondary | ICD-10-CM | POA: Diagnosis not present

## 2021-02-25 NOTE — Telephone Encounter (Signed)
Dr.Bhutanis office called to see if we got the notes they faxed over in regards to some recent lab work they did showing a spike in his MGUS labs and wanted to see if we could get him in sooner than February. Information provided to Princella Pellegrini and Dr.Ennever who would like pateint to come back in one month for repeat labs and appt to follow up. Patient called and informed and informed scheduling will call and make those appts with him.

## 2021-03-01 ENCOUNTER — Encounter: Payer: Self-pay | Admitting: Cardiovascular Disease

## 2021-03-01 NOTE — Progress Notes (Signed)
Marvin Salinas Date of Birth  12/28/1945       [facility] [id number], [place], [place]  [address]   [place], [place]  [address] [phone]     [phone]   Fax  [phone]    Fax [phone]  Problem List: 1. Coronary artery disease-status post CABG-2001 2. Hypertension 3. Hyperlipidemia 4. Diabetes mellitus 5. Extensive cerebrovascular disease with prior TIAs and known basilar artery stenosis    Marvin Salinas is seen back today for a 3 month check.  He is a former patient of Dr. Susa Simmonds. He has known CAD with prior CABG back in 2011. Other issues include HTN, HLD, DM and extensive cerebral vascular disease with prior TIA's and known basilar artery stenosis. He is on chronic Plavix and aspirin therapy. Was on coumadin in the remote past.  Pt has done well. Complains of back pain.  He gets some regular exercise - walks on occasion.  September 08, 2015: [name] is seen back after a 3-4 year absence. Former patient of [facility].  CAD - CABG in 2011,   Cerebral vascular disease   Doing well,  No CP , no further TIAs  HR is slow,  No syncope or presyncope.   HR has been slow for a while  Exercises,  Still paints houses.   Oct 20, 2016:  Marvin Salinas is doing well from a cardiac standp[place]. No CP or dyspnea Has leg cramp / burning with exercise Is on pravachol for hyperlipidemia,  Has not tolerated the other statins   Sept. 26, 2019:  Marvin Salinas has done well.  He was admitted to the hospital in April, 2019 with a non-ST segment elevation myocardial infarction.  He also had developed atrial flutter with rapid rates.  He spontaneously converted to sinus rhythm.  He was started on Tikosyn at that time.  Heart catheterization revealed patent grafts.  Has had rare episodes of chest pressure - thinks it was indigestion. Processes on a regular basis.  He walks regularly.  He also has a history of hyperlipidemia and diabetes  mellitus.  October 01, 2019:  Marvin Salinas is seen today for follow-up of his coronary artery disease, hyperlipidemia, atrial fibrillation and peripheral arterial disease.   Dr. Fletcher Anon performed successful angioplasty and stent placement to the left common iliac artery.  He is been doing well with minimal claudication since that time. Still having knee pain and hip pain .   No CP  Still smokes - wants to quit.    Sept. 14, 2021  Marvin Salinas is seen today for follow up of his CAD, atrial fib, PAD  Still smoking  Sees Mujhamad Arida - had peripheral stenting in Dec.  Has cad, - hx of CABG Needs a back injection He is at low risk for his upcoming injection and may hold his xarelto for 3 days prior to injection    Sept. 12, 2022  Marvin Salinas is seen for follow up visit Hx of CAD, PAD   Lipids from May 5, , 2022 look good.  Still smoking - has cut back  Has PVD - knows that he needs to stop smoking   Current Outpatient Medications on File Prior to Visit  Medication Sig Dispense Refill   ascorbic acid (VITAMIN C) 500 MG tablet Take by mouth.     BD INSULIN SYRINGE U/F 31G X 5/16" 0.5 ML MISC USE TO INJECT INSULIN TWICE DAILY AS DIRECTED 200 each  1   Carboxymethylcell-Hypromellose (GENTEAL OP) Place 1 drop into both eyes daily as needed (dry eyes).     cholecalciferol (VITAMIN D3) 25 MCG (1000 UNIT) tablet Take 1,000 Units by mouth daily.     co-enzyme Q-10 30 MG capsule Take 30 mg by mouth daily.     dofetilide (TIKOSYN) 125 MCG capsule TAKE 1 CAPSULE BY MOUTH TWICE A DAY 180 capsule 1   ELIQUIS 5 MG TABS tablet TAKE 1 TABLET (5 MG TOTAL) BY MOUTH 2 (TWO) TIMES DAILY 180 tablet 1   ezetimibe (ZETIA) 10 MG tablet Take 1 tablet (10 mg total) by mouth daily. 90 tablet 3   ferrous sulfate 325 (65 FE) MG tablet Take 1 tablet (325 mg total) by mouth every other day.     fluticasone (FLONASE) 50 MCG/ACT nasal spray Place 2 sprays into both nostrils daily.     glipiZIDE (GLUCOTROL) 10 MG tablet Take 1 tablet (10  mg total) by mouth 2 (two) times daily before a meal. 180 tablet 1   insulin glargine (LANTUS) 100 UNIT/ML injection 25-30 units subcu injection in the morning and 40 units subcu injection before bed. 70 mL 5   Lancets (ONETOUCH DELICA PLUS 123XX123) MISC ONETOUCH VERIO LANCETS USE TO TEST YOUR BLOOD SUGAR TWICE A DAY FINGER STICK     loratadine (CLARITIN) 10 MG tablet Take 10 mg by mouth daily as needed for allergies.     Magnesium 250 MG TABS Take by mouth.     metoprolol tartrate (LOPRESSOR) 25 MG tablet TAKE ONE-HALF TABLET BY  MOUTH TWICE DAILY 90 tablet 0   nitroGLYCERIN (NITROSTAT) 0.4 MG SL tablet PLACE 1 TABLET (0.4 MG TOTAL) UNDER THE TONGUE EVERY 5 (FIVE) MINUTES AS NEEDED FOR CHEST PAIN. 25 tablet 12   olmesartan (BENICAR) 20 MG tablet TAKE 1 AND 1/2 TABLETS DAILY BY MOUTH 135 tablet 1   ONETOUCH VERIO test strip USE WITH METER TWICE PER DAY DX DM TYPE 2     rosuvastatin (CRESTOR) 20 MG tablet Take 1 tablet (20 mg total) by mouth at bedtime. 90 tablet 3   Semaglutide (RYBELSUS) 7 MG TABS Take 1 tablet by mouth daily with breakfast. 90 tablet 1   No current facility-administered medications on file prior to visit.     Allergies  Allergen Reactions   Codeine Nausea And Vomiting   Lisinopril Cough   Nsaids Other (See Comments)    CKD   Latex Hives    Past Medical History:  Diagnosis Date   Acute kidney injury superimposed on chronic kidney disease ([facility]) 10/12/2017   Atrial fibrillation with RVR ([facility]) 10/27/2017   Back pain    Basilar artery stenosis    on chronic Plavix   Benign prostatic hypertrophy without urinary obstruction 04/17/2014   Carotid artery disease ([facility]) 10/06/2010   Carotid US 04/2019: Bilat ICA 40-59; L subclavian stenosis  // Carotid US 11/21: Bilat ICA 40-59; bilateral subclavian stenosis    Cerebral vascular disease    with prior TIA's; followed by Dr. Erling Cruz   Depression, neurotic 11/21/2013   Diabetes mellitus    on insulin   Enthesopathy of ankle and  tarsus 09/04/2007   Overview:  Metatarsalgia    Enthesopathy of ankle and tarsus 09/04/2007   Overview:  Metatarsalgia  Formatting of this note might be different from the original. Metatarsalgia  10/1 IMO update   History of renal calculi    Hyperlipidemia    Hypertension    Ischemic heart disease  prior PCI to RCA in 1989. S/P PCI to LAD and OM in 1992. S/P PCI to first DX in 2000. S/P CABG x 3 in May 2011   Left hip pain 01/03/2020   OSA (obstructive sleep apnea) 01/11/2018   AHI 18.1 and SaO2 low 73%   PAD (peripheral artery disease) ([facility]) 11/18/2017   ABIs/Arterial US 04/2019: R 1.21; L 0.66 // R SFA 30-49, stable > 50 CIA and EIA stenosis; L > 50 CIA stenosis (likely represents severe stenosis or short segment occlusion)   Peripheral neuropathy    Pneumonia Sept 2012   Sacroiliac joint dysfunction of left side 01/03/2020   Stroke ([facility]) 04/16/2001   small right cerebellar infarct on 04/16/2001 at that time he was found to have proximal left vertebral artery, proximal left common carotid artery and both external carotid artery stenosis as well as intracranial stenosis involving mid basilar artery- 07/2011 add questionable TIA    Past Surgical History:  Procedure Laterality Date    NASAL ENDOSCOPY  01/11/2020   chronic rhinitis w/o evidence of acute sinusitis, bilateral inferior turbinate hypertrophy   ABDOMINAL AORTOGRAM W/LOWER EXTREMITY Bilateral 05/30/2019   Procedure: ABDOMINAL AORTOGRAM W/LOWER EXTREMITY;  Surgeon: Wellington Hampshire, MD;  Location: [facility];  Service: Cardiovascular;  Laterality: Bilateral;   ANGIOPLASTY  1989   right coronary artery   ANGIOPLASTY  1992   LAD and OM   ANGIOPLASTY  1998   First DX   BRAIN SURGERY     on prior records   CORONARY ARTERY BYPASS GRAFT  11/12/2009   LIMA to LAD, SVG to OM and SVG to RCA   CORONARY STENT PLACEMENT  2000   Stent to LAD/Circumflex with angioplasty to first diagonal    LEFT HEART CATH AND CORS/GRAFTS  ANGIOGRAPHY N/A 10/13/2017   Procedure: LEFT HEART CATH AND CORS/GRAFTS ANGIOGRAPHY;  Surgeon: Nelva Bush, MD;  Location: [facility];  Service: Cardiovascular;  Laterality: N/A;    Social History   Tobacco Use  Smoking Status Every Day   Packs/day: 0.25   Years: 54.00   Pack years: 13.50   Types: Cigarettes  Smokeless Tobacco Never    Social History   Substance and Sexual Activity  Alcohol Use Yes    Family History  Problem Relation Age of Onset   Depression Mother    Early death Mother    Kidney disease Mother    Ovarian cancer Mother    Hypertension Father    Heart disease Father    Heart attack Father    Asthma Brother    Diabetes Brother    Stroke Other        Uncle   Diabetes Sister    Colon cancer Neg Hx    Rectal cancer Neg Hx    Stomach cancer Neg Hx    Esophageal cancer Neg Hx     Reviw of Systems:  Reviewed in the HPI.  All other systems are negative.  Physical Exam: Blood pressure 116/60, pulse 62, height '5\' 8"'$  (1.727 m), weight 169 lb 3.2 oz (76.7 kg), SpO2 97 %.  GEN:  Well nourished, well developed in no acute distress HEENT: Normal NECK: No JVD; No carotid bruits LYMPHATICS: No lymphadenopathy CARDIAC: RRR , no murmurs, rubs, gallops RESPIRATORY:  Clear to auscultation without rales, wheezing or rhonchi  ABDOMEN: Soft, non-tender, non-distended MUSCULOSKELETAL:  No edema; No deformity  SKIN: Warm and dry NEUROLOGIC:  Alert and oriented x 3   ECG:  Sept. 12, 2022:   NSR ,  NS ST /T wave in V5-V6, no changes from several ECGS in the past    Assessment / Plan:   1. Coronary artery disease:  no angina .   Cont meds.   LDL looks good Needs to stop smoking  2.  Atrial fib: is maintaining NSR   2. Hypertension -   BP is well controlled   3. Hyperlipidemia -  labs are stable    4. Diabetes mellitus -  5.  Carotid artery disease:       Mertie Moores, MD  03/02/2021 8:32 AM    [facility] [place],  [place], [place]  [address] Pager [phone] Phone: [phone]; Fax: [phone]

## 2021-03-02 ENCOUNTER — Ambulatory Visit: Payer: Medicare Other | Admitting: Cardiovascular Disease

## 2021-03-02 ENCOUNTER — Encounter: Payer: Self-pay | Admitting: Cardiovascular Disease

## 2021-03-02 ENCOUNTER — Other Ambulatory Visit: Payer: Self-pay

## 2021-03-02 VITALS — BP 116/60 | HR 62 | Ht 68.0 in | Wt 169.2 lb

## 2021-03-02 DIAGNOSIS — I6523 Occlusion and stenosis of bilateral carotid arteries: Secondary | ICD-10-CM | POA: Diagnosis not present

## 2021-03-02 DIAGNOSIS — I10 Essential (primary) hypertension: Secondary | ICD-10-CM

## 2021-03-02 DIAGNOSIS — I2510 Atherosclerotic heart disease of native coronary artery without angina pectoris: Secondary | ICD-10-CM

## 2021-03-02 DIAGNOSIS — I739 Peripheral vascular disease, unspecified: Secondary | ICD-10-CM | POA: Diagnosis not present

## 2021-03-02 NOTE — Patient Instructions (Signed)

## 2021-03-03 ENCOUNTER — Encounter: Payer: Self-pay | Admitting: Family Medicine

## 2021-03-03 ENCOUNTER — Ambulatory Visit (INDEPENDENT_AMBULATORY_CARE_PROVIDER_SITE_OTHER): Payer: Medicare Other | Admitting: Family Medicine

## 2021-03-03 VITALS — BP 118/71 | HR 63 | Temp 97.9°F | Wt 168.2 lb

## 2021-03-03 DIAGNOSIS — Z794 Long term (current) use of insulin: Secondary | ICD-10-CM | POA: Diagnosis not present

## 2021-03-03 DIAGNOSIS — G4733 Obstructive sleep apnea (adult) (pediatric): Secondary | ICD-10-CM

## 2021-03-03 DIAGNOSIS — I779 Disorder of arteries and arterioles, unspecified: Secondary | ICD-10-CM | POA: Diagnosis not present

## 2021-03-03 DIAGNOSIS — I25119 Atherosclerotic heart disease of native coronary artery with unspecified angina pectoris: Secondary | ICD-10-CM | POA: Diagnosis not present

## 2021-03-03 DIAGNOSIS — E118 Type 2 diabetes mellitus with unspecified complications: Secondary | ICD-10-CM

## 2021-03-03 DIAGNOSIS — I252 Old myocardial infarction: Secondary | ICD-10-CM

## 2021-03-03 DIAGNOSIS — N1831 Chronic kidney disease, stage 3a: Secondary | ICD-10-CM | POA: Diagnosis not present

## 2021-03-03 DIAGNOSIS — Z951 Presence of aortocoronary bypass graft: Secondary | ICD-10-CM | POA: Diagnosis not present

## 2021-03-03 DIAGNOSIS — I4892 Unspecified atrial flutter: Secondary | ICD-10-CM

## 2021-03-03 DIAGNOSIS — I4891 Unspecified atrial fibrillation: Secondary | ICD-10-CM

## 2021-03-03 DIAGNOSIS — I10 Essential (primary) hypertension: Secondary | ICD-10-CM

## 2021-03-03 DIAGNOSIS — I472 Ventricular tachycardia: Secondary | ICD-10-CM

## 2021-03-03 DIAGNOSIS — I739 Peripheral vascular disease, unspecified: Secondary | ICD-10-CM | POA: Diagnosis not present

## 2021-03-03 DIAGNOSIS — E785 Hyperlipidemia, unspecified: Secondary | ICD-10-CM

## 2021-03-03 DIAGNOSIS — E1149 Type 2 diabetes mellitus with other diabetic neurological complication: Secondary | ICD-10-CM

## 2021-03-03 DIAGNOSIS — I4729 Other ventricular tachycardia: Secondary | ICD-10-CM

## 2021-03-03 LAB — POCT GLYCOSYLATED HEMOGLOBIN (HGB A1C)
HbA1c POC (<> result, manual entry): 6.2 % (ref 4.0–5.6)
HbA1c, POC (controlled diabetic range): 6.2 % (ref 0.0–7.0)
HbA1c, POC (prediabetic range): 6.2 % (ref 5.7–6.4)
Hemoglobin A1C: 6.2 % — AB (ref 4.0–5.6)

## 2021-03-03 MED ORDER — RYBELSUS 7 MG PO TABS: 1.0000 | ORAL_TABLET | Freq: Every day | ORAL | 1 refills | Status: DC

## 2021-03-03 MED ORDER — BLOOD GLUCOSE MONITOR KIT: PACK | 0 refills | Status: DC

## 2021-03-03 MED ORDER — INSULIN GLARGINE 100 UNIT/ML ~~LOC~~ SOLN: SUBCUTANEOUS | 5 refills | Status: DC

## 2021-03-03 MED ORDER — OLMESARTAN MEDOXOMIL 20 MG PO TABS: ORAL_TABLET | ORAL | 1 refills | Status: DC

## 2021-03-03 NOTE — Patient Instructions (Addendum)
Great to see you today. You look great! I have refilled the medication(s) we provide.   If labs were collected, we will inform you of lab results once received either by echart message or telephone call.   - echart message- for normal results that have been seen by the patient already.   - telephone call: abnormal results or if patient has not viewed results in their echart.

## 2021-03-03 NOTE — Progress Notes (Signed)
This visit occurred during the SARS-CoV-2 public health emergency.  Safety protocols were in place, including screening questions prior to the visit, additional usage of staff PPE, and extensive cleaning of exam room while observing appropriate contact time as indicated for disinfecting solutions.    Patient ID: Marvin Salinas, male  DOB: [birth year], 75 y.o.   MRN: [id number] Patient Care Team    Relationship Specialty Notifications Start End  Ma Hillock, DO PCP - General Family Medicine  01/24/19   [name], Wonda Cheng, MD PCP - Cardiology Cardiology  03/02/21   Chesley Mires, MD Consulting Physician Pulmonary Disease  01/26/19   Constance Haw, MD Consulting Physician Cardiology  01/26/19   Calvert Cantor, MD Consulting Physician Ophthalmology  01/26/19   Garvin Fila, MD Consulting Physician Neurology  01/26/19   Lennon Alstrom, MD Consulting Physician Neurology  01/26/19   [name], Wonda Cheng, MD Consulting Physician Cardiology  04/30/20   Michiel Cowboy, [facility]   06/17/20   Milus Banister, MD Attending Physician Gastroenterology  10/23/20   Celso Amy, NP Nurse Practitioner Nurse Practitioner  10/23/20    Comment: Hematology    Chief Complaint  Patient presents with   Follow-up    Pt is fastin and would like to wait until October for flu shot   Medication Refill   Diabetes    Subjective: Marvin Salinas is a 75 y.o.  male present for  [facility] Type 2 diabetes mellitus with stage 3 chronic kidney disease, with long-term current use of insulin ([place]) Pt reports compliance with Lantus 20 units in the morning and 30 units nightly, Rybelsus 7 mg daily and glipizide 10/10 mg bid.   He reports his fasting blood glucose is approximately 90-110.  He is watching his diet closely and has lost weight. PNA series: Pneumonia series completed Flu shot: completed  04/29/2020 (recommneded yearly) like to wait till October for 2022 [facility] Foot exam: Referred to podiatry  12/2018-completed UTD Eye exam: 03/2020, Dr. Gwenevere Ghazi changes noted A1c: 8.9>>> 13.7 (08/22/2018)>>11 (01/24/2019)>>10.8 08/20/2019> 10.5>10.3> 6.4 > 6.2 today  Atrial fibrillation with RVR (HCC)/Essential hypertension/Ischemic heart disease/ S/P CABG x 3/Paroxysmal atrial flutter (HCC)/PAD (peripheral artery disease) (HCC)/Nonsustained ventricular tachycardia (HCC)/HLD/NSTEMI/h/o stroke/CKD 3/chronic anticoagulation Patient has a history of severe 3-vessel coronary artery disease with chronic total occlusions of the proximal/mid LAD, mid/distal left circumflex/and proximal RCA.  Moderate caliber D1 and distal RPDA demonstrate 70-80% stenosis.  Moderately elevated left ventricular filling pressure.  Coronary artery stents and CABG have been completed in the past.  He also has a history of cerebral ischemia and TIAs. Pt reports compliance with  Crestor, Eliquis, Benicar 20, metoprolol 12.5 mg twice daily, Zetia, tikosyn. Blood pressures ranges at home WNL.   Patient reports he is feeling really good.  He is happy with his outcome with weight loss. Pt is prescribed statin. Diet: Heart healthy-low-sodium diet encouraged RF: Hypertension, hyperlipidemia, former smoker, STEMI, stroke, diabetes, family history, CAD, CVD, PAD, bilateral carotid artery stenosis, TIA    Depression screen [facility] 2/9 04/29/2020 02/15/2020 01/24/2019  Decreased Interest 0 0 0  Down, Depressed, Hopeless 0 0 0  PHQ - 2 Score 0 0 0  Altered sleeping - - 0  Tired, decreased energy - - 0  Change in appetite - - 0  Feeling bad or failure about yourself  - - 0  Trouble concentrating - - 0  Moving slowly or fidgety/restless - - 0  Suicidal  thoughts - - 0  PHQ-9 Score - - 0  Difficult doing work/chores - - Not difficult at all  Some recent data might be hidden   No flowsheet data found.      Fall Risk  11/06/2020 07/11/2020 04/29/2020 01/24/2019  Falls in the past year? 0 0 0 0  Number falls in past yr: 0 0 0 0  Injury with Fall?  0 0 0 0  Follow up Falls prevention discussed;Education provided;Falls evaluation completed Falls prevention discussed;Falls evaluation completed;Education provided Falls evaluation completed -    Immunization History  Administered Date(s) Administered   Fluad Quad(high Dose 65+) 02/12/2019, 04/29/2020   Influenza Split 05/08/2013, 03/28/2017   Influenza, High Dose Seasonal PF 05/08/2013, 03/28/2017, 05/02/2018   Influenza,inj,Quad PF,6+ Mos 04/21/2016   Moderna Sars-Covid-2 Vaccination 08/27/2019, 10/03/2019   Pneumococcal Conjugate-13 07/13/2016   Pneumococcal Polysaccharide-23 02/21/2018   Tdap 02/15/2013   Zoster, Live 06/22/2015    No results found.  Past Medical History:  Diagnosis Date   Acute kidney injury superimposed on chronic kidney disease ([place]) 10/12/2017   Atrial fibrillation with RVR ([place]) 10/27/2017   Back pain    Basilar artery stenosis    on chronic Plavix   Benign prostatic hypertrophy without urinary obstruction 04/17/2014   Carotid artery disease ([place]) 10/06/2010   Carotid US 04/2019: Bilat ICA 40-59; L subclavian stenosis  // Carotid US 11/21: Bilat ICA 40-59; bilateral subclavian stenosis    Cerebral vascular disease    with prior TIA's; followed by Dr. Erling Cruz   Depression, neurotic 11/21/2013   Diabetes mellitus    on insulin   Enthesopathy of ankle and tarsus 09/04/2007   Overview:  Metatarsalgia    Enthesopathy of ankle and tarsus 09/04/2007   Overview:  Metatarsalgia  Formatting of this note might be different from the original. Metatarsalgia  10/1 IMO update   History of renal calculi    Hyperlipidemia    Hypertension    Ischemic heart disease    prior PCI to RCA in 1989. S/P PCI to LAD and OM in 1992. S/P PCI to first DX in 2000. S/P CABG x 3 in May 2011   Left hip pain 01/03/2020   OSA (obstructive sleep apnea) 01/11/2018   AHI 18.1 and SaO2 low 73%   PAD (peripheral artery disease) ([place]) 11/18/2017   ABIs/Arterial US 04/2019: R 1.21; L 0.66 // R  SFA 30-49, stable > 50 CIA and EIA stenosis; L > 50 CIA stenosis (likely represents severe stenosis or short segment occlusion)   Peripheral neuropathy    Pneumonia Sept 2012   Sacroiliac joint dysfunction of left side 01/03/2020   Stroke ([place]) 04/16/2001   small right cerebellar infarct on 04/16/2001 at that time he was found to have proximal left vertebral artery, proximal left common carotid artery and both external carotid artery stenosis as well as intracranial stenosis involving mid basilar artery- 07/2011 add questionable TIA   Allergies  Allergen Reactions   Codeine Nausea And Vomiting   Lisinopril Cough   Nsaids Other (See Comments)    CKD   Latex Hives   Past Surgical History:  Procedure Laterality Date    NASAL ENDOSCOPY  01/11/2020   chronic rhinitis w/o evidence of acute sinusitis, bilateral inferior turbinate hypertrophy   ABDOMINAL AORTOGRAM W/LOWER EXTREMITY Bilateral 05/30/2019   Procedure: ABDOMINAL AORTOGRAM W/LOWER EXTREMITY;  Surgeon: Wellington Hampshire, MD;  Location: [place] [facility];  Service: Cardiovascular;  Laterality: Bilateral;   ANGIOPLASTY  1989   right  coronary artery   ANGIOPLASTY  1992   LAD and OM   ANGIOPLASTY  1998   First DX   BRAIN SURGERY     on prior records   CORONARY ARTERY BYPASS GRAFT  11/12/2009   LIMA to LAD, SVG to OM and SVG to RCA   CORONARY STENT PLACEMENT  2000   Stent to LAD/Circumflex with angioplasty to first diagonal    LEFT HEART CATH AND CORS/GRAFTS ANGIOGRAPHY N/A 10/13/2017   Procedure: LEFT HEART CATH AND CORS/GRAFTS ANGIOGRAPHY;  Surgeon: Nelva Bush, MD;  Location: [place] [facility];  Service: Cardiovascular;  Laterality: N/A;   Family History  Problem Relation Age of Onset   Depression Mother    Early death Mother    Kidney disease Mother    Ovarian cancer Mother    Hypertension Father    Heart disease Father    Heart attack Father    Asthma Brother    Diabetes Brother    Stroke Other        Uncle    Diabetes Sister    Colon cancer Neg Hx    Rectal cancer Neg Hx    Stomach cancer Neg Hx    Esophageal cancer Neg Hx    Social History   Social History Narrative   Marital status/children/pets: divorced.   Education/employment: retired, Patient has his GED. 9th grade education.    Safety:      -smoke alarm in the home:Yes     - wears seatbelt: Yes   Patient is right handed.   Patient does not drink any caffeine.    Allergies as of 03/03/2021       Reactions   Codeine Nausea And Vomiting   Lisinopril Cough   Nsaids Other (See Comments)   CKD   Latex Hives        Medication List        Accurate as of March 03, 2021 11:59 PM. If you have any questions, ask your nurse or doctor.          STOP taking these medications    glipiZIDE 10 MG tablet Commonly known as: GLUCOTROL Stopped by: Howard Pouch, DO       TAKE these medications    ascorbic acid 500 MG tablet Commonly known as: VITAMIN C Take by mouth.   BD Insulin Syringe U/F 31G X 5/16" 0.5 ML Misc Generic drug: Insulin Syringe-Needle U-100 USE TO INJECT INSULIN TWICE DAILY AS DIRECTED   blood glucose meter kit and supplies Kit Dispense based on patient and insurance preference. Use up to four times daily as directed. Started by: Howard Pouch, DO   cholecalciferol 25 MCG (1000 UNIT) tablet Commonly known as: VITAMIN D3 Take 1,000 Units by mouth daily.   co-enzyme Q-10 30 MG capsule Take 30 mg by mouth daily.   dofetilide 125 MCG capsule Commonly known as: TIKOSYN TAKE 1 CAPSULE BY MOUTH TWICE A DAY   Eliquis 5 MG Tabs tablet Generic drug: apixaban TAKE 1 TABLET (5 MG TOTAL) BY MOUTH 2 (TWO) TIMES DAILY   ezetimibe 10 MG tablet Commonly known as: ZETIA Take 1 tablet (10 mg total) by mouth daily.   ferrous sulfate 325 (65 FE) MG tablet Take 1 tablet (325 mg total) by mouth every other day.   fluticasone 50 MCG/ACT nasal spray Commonly known as: FLONASE Place 2 sprays into both  nostrils daily.   GENTEAL OP Place 1 drop into both eyes daily as needed (dry eyes).   insulin  glargine 100 UNIT/ML injection Commonly known as: LANTUS 20-25 units subcu injection in the morning and 30-35 units subcu injection before bed. What changed: additional instructions Changed by: Howard Pouch, DO   loratadine 10 MG tablet Commonly known as: CLARITIN Take 10 mg by mouth daily as needed for allergies.   Magnesium 250 MG Tabs Take by mouth.   metoprolol tartrate 25 MG tablet Commonly known as: LOPRESSOR TAKE ONE-HALF TABLET BY  MOUTH TWICE DAILY   nitroGLYCERIN 0.4 MG SL tablet Commonly known as: NITROSTAT PLACE 1 TABLET (0.4 MG TOTAL) UNDER THE TONGUE EVERY 5 (FIVE) MINUTES AS NEEDED FOR CHEST PAIN.   olmesartan 20 MG tablet Commonly known as: BENICAR TAKE 1 AND 1/2 TABLETS DAILY BY MOUTH   OneTouch Delica Plus [id number] Misc ONETOUCH VERIO LANCETS USE TO TEST YOUR BLOOD SUGAR TWICE A DAY FINGER STICK   OneTouch Verio test strip Generic drug: glucose blood USE WITH METER TWICE PER DAY DX DM TYPE 2   polyethylene glycol powder 17 GM/SCOOP powder Commonly known as: GLYCOLAX/MIRALAX Take by mouth.   rosuvastatin 20 MG tablet Commonly known as: CRESTOR Take 1 tablet (20 mg total) by mouth at bedtime.   Rybelsus 7 MG Tabs Generic drug: Semaglutide Take 1 tablet by mouth daily with breakfast.        All past medical history, surgical history, allergies, family history, immunizations andmedications were updated in the EMR today and reviewed under the history and medication portions of their EMR.      ROS: 14 pt review of systems performed and negative (unless mentioned in an HPI)  Objective: BP 118/71   Pulse 63   Temp 97.9 F (36.6 C) (Oral)   Wt 168 lb 3.2 oz (76.3 kg)   SpO2 99%   BMI 25.57 kg/m  Gen: Afebrile. No acute distress.  Nontoxic, very pleasant male.  Looks great today. HENT: AT. [place].  No cough.  No hoarseness.   Eyes:Pupils Equal Round  Reactive to light, Extraocular movements intact,  Conjunctiva without redness, discharge or icterus. Neck/lymp/endocrine: Supple, no lymphadenopathy, no thyromegaly CV: RRR no murmur, no edema, +2/4 P posterior tibialis pulses Chest: CTAB, no wheeze or crackles Skin: No rashes, purpura or petechiae.  Neuro:  Normal gait. PERLA. EOMi. Alert. Oriented x3 Psych: Normal affect, dress and demeanor. Normal speech. Normal thought content and judgment.  No results found for this or any previous visit (from the past 24 hour(s)).   Assessment/plan: Marvin Salinas is a 75 y.o. male present for est care Type 2 diabetes mellitus with stage 3 chronic kidney disease, with long-term current use of insulin ([place]) -Patient was encouraged to follow a diabetic diet and make an effort to get routine exercise.  He is doing really great with his weight loss. -Increase Lantus to 25 units a.m. and 35 units in the night.   - Given his cardiac history and his CKD oral options are limited-insurance does not cover Jardiance either. -DISCONTINUE glipizide 10/10 mg twice daily before meals today. -Continue Rybelsus 7 mg daily - Medicines tried: Ozempic (side effects) - He declined nutrition referral PNA series: Pneumonia series completed Flu shot: completed11/02/2020 (recommneded yearly).  Wants to wait October to receive. Foot exam: Referred to podiatry 12/2018-completed  Eye exam: 03/2020, Dr. Gwenevere Ghazi changes noted A1c: 8.9>>> 13.7 (08/22/2018)>>11 (01/24/2019)>>10.8 08/20/2019> 10.5>10.3> 6.4 > 6.2 today!!!!    Atrial fibrillation with RVR (HCC)/Essential hypertension/Ischemic heart disease/ S/P CABG x 3/Paroxysmal atrial flutter (HCC)/PAD (peripheral artery disease) (HCC)/Nonsustained ventricular tachycardia (HCC)/HLD/NSTEMI/h/o stroke/PAD -  BP looks great! - Patient aware goal is less than 130/80.   - Prescribed by cardio: tikosyn, Eliquis (recently changed from xarelto) -Continue Crestor 20 mg daily   -continue zeita. -Continue Benicar 30 mg a day. - continue metoprolol 12.5 mg twice daily  - He has stopped smoking !!!!   Chronic kidney disease (CKD), stage III (moderate) (HCC) Renally dose meds. Avoid NSAIDs. PTH/calcium/vitamin D UTD 10/2020 Referred to nephrology 2022   Return in about 4 months (around 06/29/2021) for [place] (30 min).  Orders Placed This Encounter  Procedures   POCT glycosylated hemoglobin (Hb A1C)    Meds ordered this encounter  Medications   insulin glargine (LANTUS) 100 UNIT/ML injection    Sig: 20-25 units subcu injection in the morning and 30-35 units subcu injection before bed.    Dispense:  70 mL    Refill:  5    DC ALL other lantus scripts please. DC glipizide as well.  Dose change. thank you.   olmesartan (BENICAR) 20 MG tablet    Sig: TAKE 1 AND 1/2 TABLETS DAILY BY MOUTH    Dispense:  135 tablet    Refill:  1   Semaglutide (RYBELSUS) 7 MG TABS    Sig: Take 1 tablet by mouth daily with breakfast.    Dispense:  90 tablet    Refill:  1   blood glucose meter kit and supplies KIT    Sig: Dispense based on patient and insurance preference. Use up to four times daily as directed.    Dispense:  1 each    Refill:  0    Per formulary.    Order Specific Question:   Number of strips    Answer:   120    Order Specific Question:   Number of lancets    Answer:   120    Referral Orders  No referral(s) requested today     Note is dictated utilizing voice recognition software. Although note has been proof read prior to signing, occasional typographical errors still can be missed. If any questions arise, please do not hesitate to call for verification.  Electronically signed by: Howard Pouch, DO [place]

## 2021-03-07 ENCOUNTER — Other Ambulatory Visit: Payer: Self-pay | Admitting: Cardiovascular Disease

## 2021-03-09 ENCOUNTER — Other Ambulatory Visit: Payer: Self-pay | Admitting: *Deleted

## 2021-03-09 NOTE — Telephone Encounter (Signed)
Prescription refill request for Eliquis received.  Indication: afib  Last office visit: [name], 03/02/2021 Scr: 1.35, 02/04/2021 Age: 75 yo  Weight:  76.3 kg   Refill sent.

## 2021-03-09 NOTE — Patient Instructions (Addendum)
Goals Addressed             This Visit's Progress    ([facility]) Monitor and Manage My Blood Sugar       Timeframe:  Long-Range Goal Priority:  High Start Date: 04/03/20                            Expected End Date: 04/19/21                     Follow Up Date 06/19/21   - check blood sugar at prescribed times - check blood sugar if I feel it is too high or too low - enter blood sugar readings and medication or insulin into daily log - take the blood sugar log to all doctor visits - take the blood sugar meter to all doctor visits  -Encouraged continuation of limiting sugar and carbohydrates in diet -Encouraged continuation of increasing physical activity by push mowing his yard and tending to his vegetable garden -Encouraged patient to continue to eat a snack at night before bedtime to prevent low blood sugar drops in the morning hours.  Why is this important?   Checking your blood sugar at home helps to keep it from getting very high or very low.  Writing the results in a diary or log helps the doctor know how to care for you.  Your blood sugar log should have the time, date and the results.  Also, write down the amount of insulin or other medicine that you take.  Other information, like what you ate, exercise done and how you were feeling, will also be helpful.     Notes: 07/11/20: Patient reports checking his blood sugar twice daily and as needed. He does record his values in a log. Patient states he is motivated to improving his diabetes control. He reports maintaining a diabetic diet, increasing his physical activity by walking and doing house projects. He has a goal of lowering his A1c to 7 for his overall health.   Updated 11/06/20: Patient continues to check his blood sugar twice daily and record his values. He states his diabetes is currently under control. Patient's last A1c was 6.4 on 10/23/20. Patient states he has loss 12 pounds and is working towards losing another 12 pounds by  continuing to eat healthy and by staying physically active. Nurse congratulated patient for being dedicated to his health and wellness.  Updated 03/09/21: Patient reports that his A1c decreased to 6.2. His blood sugar ranges are 65-115. He has begun to eat a snack at night if his blood sugar is low. This has decreased the low blood sugar drops which typically occur in the morning. Nurse will send patient hypoglycemia education. Patient states that his weight is now 160 pounds. He has reached his weight goal by maintaining a heart healthy, diabetic diet and by staying active. Patient states he is waiting for a call-back from a neuropathy specialist. Adding that his neuropathy pain has been worse and he is hoping the specialist can help to decrease the pain. Nurse congratulated the patient for maintaining his health and wellness goals.      [facility]) Stop or Cut Down Tobacco Use       Timeframe:  Long-Range Goal Priority:  Low Start Date: 04/03/20                            Expected  End Date:  12/18/21                   Follow Up Date 06/19/21   - change or avoid triggers like smoky places, drinking alcohol and other smokers - Encouraged continuation of  not smoking more than 3-4 cigarettes daily    Why is this important?   To stop or cut down it is important to have support from a person or group of people who you can count on.  You will also need to think about the things that make you feel like smoking, then plan for how to handle them.    Notes:  05/02/20-remains quit smoking 07/11/20: Patient has not smoked since 9/21. Patient states he is doing very well and feels great.  03/09/21: Patient states he smokes 3-4 cigarettes a day. He does not keep a pack with him and sets out 4 cigarettes a day. Patient states he spreads them out during the day and does not smoke more than 4 cigarettes daily.

## 2021-03-09 NOTE — Patient Outreach (Signed)
[facility]) Care Management  [place]  03/09/2021   Marvin Salinas [birth year] [id number]  Subjective: Successful telephone outreach call to patient. HIPAA identifiers obtained. Patient states he is dong well at this time. Nurse discussed with patient his health goals and his health and wellness needs which were documented in the Epic system. Patient did not have any further questions or concerns today and did confirm that he has this nurse's contact number to call her if needed.   Encounter Medications:  Outpatient Encounter Medications as of 03/09/2021  Medication Sig   ascorbic acid (VITAMIN C) 500 MG tablet Take by mouth.   BD INSULIN SYRINGE U/F 31G X 5/16" 0.5 ML MISC USE TO INJECT INSULIN TWICE DAILY AS DIRECTED   blood glucose meter kit and supplies KIT Dispense based on patient and insurance preference. Use up to four times daily as directed.   Carboxymethylcell-Hypromellose (GENTEAL OP) Place 1 drop into both eyes daily as needed (dry eyes).   cholecalciferol (VITAMIN D3) 25 MCG (1000 UNIT) tablet Take 1,000 Units by mouth daily.   co-enzyme Q-10 30 MG capsule Take 30 mg by mouth daily.   dofetilide (TIKOSYN) 125 MCG capsule TAKE 1 CAPSULE BY MOUTH TWICE A DAY   ELIQUIS 5 MG TABS tablet TAKE 1 TABLET BY MOUTH TWICE A DAY   ezetimibe (ZETIA) 10 MG tablet Take 1 tablet (10 mg total) by mouth daily.   ferrous sulfate 325 (65 FE) MG tablet Take 1 tablet (325 mg total) by mouth every other day.   fluticasone (FLONASE) 50 MCG/ACT nasal spray Place 2 sprays into both nostrils daily.   insulin glargine (LANTUS) 100 UNIT/ML injection 20-25 units subcu injection in the morning and 30-35 units subcu injection before bed.   Lancets (ONETOUCH DELICA PLUS [id number]) MISC ONETOUCH VERIO LANCETS USE TO TEST YOUR BLOOD SUGAR TWICE A DAY FINGER STICK   loratadine (CLARITIN) 10 MG tablet Take 10 mg by mouth daily as needed for allergies.   Magnesium 250 MG TABS Take by  mouth.   metoprolol tartrate (LOPRESSOR) 25 MG tablet TAKE ONE-HALF TABLET BY  MOUTH TWICE DAILY   nitroGLYCERIN (NITROSTAT) 0.4 MG SL tablet PLACE 1 TABLET (0.4 MG TOTAL) UNDER THE TONGUE EVERY 5 (FIVE) MINUTES AS NEEDED FOR CHEST PAIN.   olmesartan (BENICAR) 20 MG tablet TAKE 1 AND 1/2 TABLETS DAILY BY MOUTH   ONETOUCH VERIO test strip USE WITH METER TWICE PER DAY DX DM TYPE 2   polyethylene glycol powder (GLYCOLAX/MIRALAX) 17 GM/SCOOP powder Take by mouth.   rosuvastatin (CRESTOR) 20 MG tablet Take 1 tablet (20 mg total) by mouth at bedtime.   Semaglutide (RYBELSUS) 7 MG TABS Take 1 tablet by mouth daily with breakfast.   No facility-administered encounter medications on file as of 03/09/2021.    Functional Status:  No flowsheet data found.  Fall/Depression Screening: Fall Risk  03/09/2021 11/06/2020 07/11/2020  Falls in the past year? 0 0 0  Number falls in past yr: 0 0 0  Injury with Fall? 0 0 0  Follow up Falls evaluation completed;Education provided;Falls prevention discussed Falls prevention discussed;Education provided;Falls evaluation completed Falls prevention discussed;Falls evaluation completed;Education provided   PHQ 2/9 Scores 04/29/2020 02/15/2020 01/24/2019  PHQ - 2 Score 0 0 0  PHQ- 9 Score - - 0    Assessment:   Care Plan There are no care plans that you recently modified to display for this patient.    Goals Addressed  This Visit's Progress    ([facility]) Monitor and Manage My Blood Sugar       Timeframe:  Long-Range Goal Priority:  High Start Date: 04/03/20                            Expected End Date: 04/19/21                     Follow Up Date 06/19/21   - check blood sugar at prescribed times - check blood sugar if I feel it is too high or too low - enter blood sugar readings and medication or insulin into daily log - take the blood sugar log to all doctor visits - take the blood sugar meter to all doctor visits  -Encouraged continuation of  limiting sugar and carbohydrates in diet -Encouraged continuation of increasing physical activity by push mowing his yard and tending to his vegetable garden -Encouraged patient to continue to eat a snack at night before bedtime to prevent low blood sugar drops in the morning hours.  Why is this important?   Checking your blood sugar at home helps to keep it from getting very high or very low.  Writing the results in a diary or log helps the doctor know how to care for you.  Your blood sugar log should have the time, date and the results.  Also, write down the amount of insulin or other medicine that you take.  Other information, like what you ate, exercise done and how you were feeling, will also be helpful.     Notes: 07/11/20: Patient reports checking his blood sugar twice daily and as needed. He does record his values in a log. Patient states he is motivated to improving his diabetes control. He reports maintaining a diabetic diet, increasing his physical activity by walking and doing house projects. He has a goal of lowering his A1c to 7 for his overall health.   Updated 11/06/20: Patient continues to check his blood sugar twice daily and record his values. He states his diabetes is currently under control. Patient's last A1c was 6.4 on 10/23/20. Patient states he has loss 12 pounds and is working towards losing another 12 pounds by continuing to eat healthy and by staying physically active. Nurse congratulated patient for being dedicated to his health and wellness.  Updated 03/09/21: Patient reports that his A1c decreased to 6.2. His blood sugar ranges are 65-115. He has begun to eat a snack at night if his blood sugar is low. This has decreased the low blood sugar drops which typically occur in the morning. Nurse will send patient hypoglycemia education. Patient states that his weight is now 160 pounds. He has reached his weight goal by maintaining a heart healthy, diabetic diet and by staying  active. Patient states he is waiting for a call-back from a neuropathy specialist. Adding that his neuropathy pain has been worse and he is hoping the specialist can help to decrease the pain. Nurse congratulated the patient for maintaining his health and wellness goals.      [facility]) Stop or Cut Down Tobacco Use       Timeframe:  Long-Range Goal Priority:  Low Start Date: 04/03/20                            Expected End Date:  12/18/21  Follow Up Date 06/19/21   - change or avoid triggers like smoky places, drinking alcohol and other smokers - Encouraged continuation of  not smoking more than 3-4 cigarettes daily    Why is this important?   To stop or cut down it is important to have support from a person or group of people who you can count on.  You will also need to think about the things that make you feel like smoking, then plan for how to handle them.    Notes:  05/02/20-remains quit smoking 07/11/20: Patient has not smoked since 9/21. Patient states he is doing very well and feels great.  03/09/21: Patient states he smokes 3-4 cigarettes a day. He does not keep a pack with him and sets out 4 cigarettes a day. Patient states he spreads them out during the day and does not smoke more than 4 cigarettes daily.        Plan: RN Health Coach will  send PCP today's assessment note, will send patient hypoglycemia education, and will call patient within the month of December. Follow-up: Patient agrees to Care Plan and Follow-up.  Emelia Loron RN, BSN [place] [phone] [email or web address]

## 2021-03-18 ENCOUNTER — Ambulatory Visit (INDEPENDENT_AMBULATORY_CARE_PROVIDER_SITE_OTHER): Payer: Medicare Other | Admitting: *Deleted

## 2021-03-18 DIAGNOSIS — Z0000 Encounter for general adult medical examination without abnormal findings: Secondary | ICD-10-CM | POA: Diagnosis not present

## 2021-03-18 DIAGNOSIS — R278 Other lack of coordination: Secondary | ICD-10-CM | POA: Diagnosis not present

## 2021-03-18 DIAGNOSIS — M79604 Pain in right leg: Secondary | ICD-10-CM | POA: Diagnosis not present

## 2021-03-18 DIAGNOSIS — R202 Paresthesia of skin: Secondary | ICD-10-CM | POA: Diagnosis not present

## 2021-03-18 DIAGNOSIS — G2581 Restless legs syndrome: Secondary | ICD-10-CM | POA: Diagnosis not present

## 2021-03-18 DIAGNOSIS — E1049 Type 1 diabetes mellitus with other diabetic neurological complication: Secondary | ICD-10-CM | POA: Diagnosis not present

## 2021-03-18 DIAGNOSIS — I10 Essential (primary) hypertension: Secondary | ICD-10-CM | POA: Diagnosis not present

## 2021-03-18 NOTE — Patient Instructions (Signed)
Marvin Salinas , Thank you for taking time to come for your Medicare Wellness Visit. I appreciate your ongoing commitment to your health goals. Please review the following plan we discussed and let me know if I can assist you in the future.   Screening recommendations/referrals: Colonoscopy: no longer required Recommended yearly ophthalmology/optometry visit for glaucoma screening and checkup Recommended yearly dental visit for hygiene and checkup  Vaccinations: Influenza vaccine: Education provided Pneumococcal vaccine: Education provided Tdap vaccine: up to date Shingles vaccine: Education provided    Advanced directives: Education provided  Conditions/risks identified:     Preventive Care 76 Years and Older, Male Preventive care refers to lifestyle choices and visits with your health care provider that can promote health and wellness. What does preventive care include? A yearly physical exam. This is also called an annual well check. Dental exams once or twice a year. Routine eye exams. Ask your health care provider how often you should have your eyes checked. Personal lifestyle choices, including: Daily care of your teeth and gums. Regular physical activity. Eating a healthy diet. Avoiding tobacco and drug use. Limiting alcohol use. Practicing safe sex. Taking low doses of aspirin every day. Taking vitamin and mineral supplements as recommended by your health care provider. What happens during an annual well check? The services and screenings done by your health care provider during your annual well check will depend on your age, overall health, lifestyle risk factors, and family history of disease. Counseling  Your health care provider may ask you questions about your: Alcohol use. Tobacco use. Drug use. Emotional well-being. Home and relationship well-being. Sexual activity. Eating habits. History of falls. Memory and ability to understand (cognition). Work and work  Statistician. Screening  You may have the following tests or measurements: Height, weight, and BMI. Blood pressure. Lipid and cholesterol levels. These may be checked every 5 years, or more frequently if you are over 6 years old. Skin check. Lung cancer screening. You may have this screening every year starting at age 31 if you have a 30-pack-year history of smoking and currently smoke or have quit within the past 15 years. Fecal occult blood test (FOBT) of the stool. You may have this test every year starting at age 74. Flexible sigmoidoscopy or colonoscopy. You may have a sigmoidoscopy every 5 years or a colonoscopy every 10 years starting at age 45. Prostate cancer screening. Recommendations will vary depending on your family history and other risks. Hepatitis C blood test. Hepatitis B blood test. Sexually transmitted disease (STD) testing. Diabetes screening. This is done by checking your blood sugar (glucose) after you have not eaten for a while (fasting). You may have this done every 1-3 years. Abdominal aortic aneurysm (AAA) screening. You may need this if you are a current or former smoker. Osteoporosis. You may be screened starting at age 60 if you are at high risk. Talk with your health care provider about your test results, treatment options, and if necessary, the need for more tests. Vaccines  Your health care provider may recommend certain vaccines, such as: Influenza vaccine. This is recommended every year. Tetanus, diphtheria, and acellular pertussis (Tdap, Td) vaccine. You may need a Td booster every 10 years. Zoster vaccine. You may need this after age 45. Pneumococcal 13-valent conjugate (PCV13) vaccine. One dose is recommended after age 73. Pneumococcal polysaccharide (PPSV23) vaccine. One dose is recommended after age 16. Talk to your health care provider about which screenings and vaccines you need and how often you need  them. This information is not intended to replace  advice given to you by your health care provider. Make sure you discuss any questions you have with your health care provider. Document Released: 07/04/2015 Document Revised: 02/25/2016 Document Reviewed: 04/08/2015 Elsevier Interactive Patient Education  2017 [place] Prevention in the Home Falls can cause injuries. They can happen to people of all ages. There are many things you can do to make your home safe and to help prevent falls. What can I do on the outside of my home? Regularly fix the edges of walkways and driveways and fix any cracks. Remove anything that might make you trip as you walk through a door, such as a raised step or threshold. Trim any bushes or trees on the path to your home. Use bright outdoor lighting. Clear any walking paths of anything that might make someone trip, such as rocks or tools. Regularly check to see if handrails are loose or broken. Make sure that both sides of any steps have handrails. Any raised decks and porches should have guardrails on the edges. Have any leaves, snow, or ice cleared regularly. Use sand or salt on walking paths during winter. Clean up any spills in your garage right away. This includes oil or grease spills. What can I do in the bathroom? Use night lights. Install grab bars by the toilet and in the tub and shower. Do not use towel bars as grab bars. Use non-skid mats or decals in the tub or shower. If you need to sit down in the shower, use a plastic, non-slip stool. Keep the floor dry. Clean up any water that spills on the floor as soon as it happens. Remove soap buildup in the tub or shower regularly. Attach bath mats securely with double-sided non-slip rug tape. Do not have throw rugs and other things on the floor that can make you trip. What can I do in the bedroom? Use night lights. Make sure that you have a light by your bed that is easy to reach. Do not use any sheets or blankets that are too big for your bed.  They should not hang down onto the floor. Have a firm chair that has side arms. You can use this for support while you get dressed. Do not have throw rugs and other things on the floor that can make you trip. What can I do in the kitchen? Clean up any spills right away. Avoid walking on wet floors. Keep items that you use a lot in easy-to-reach places. If you need to reach something above you, use a strong step stool that has a grab bar. Keep electrical cords out of the way. Do not use floor polish or wax that makes floors slippery. If you must use wax, use non-skid floor wax. Do not have throw rugs and other things on the floor that can make you trip. What can I do with my stairs? Do not leave any items on the stairs. Make sure that there are handrails on both sides of the stairs and use them. Fix handrails that are broken or loose. Make sure that handrails are as long as the stairways. Check any carpeting to make sure that it is firmly attached to the stairs. Fix any carpet that is loose or worn. Avoid having throw rugs at the top or bottom of the stairs. If you do have throw rugs, attach them to the floor with carpet tape. Make sure that you have a light switch at  the top of the stairs and the bottom of the stairs. If you do not have them, ask someone to add them for you. What else can I do to help prevent falls? Wear shoes that: Do not have high heels. Have rubber bottoms. Are comfortable and fit you well. Are closed at the toe. Do not wear sandals. If you use a stepladder: Make sure that it is fully opened. Do not climb a closed stepladder. Make sure that both sides of the stepladder are locked into place. Ask someone to hold it for you, if possible. Clearly mark and make sure that you can see: Any grab bars or handrails. First and last steps. Where the edge of each step is. Use tools that help you move around (mobility aids) if they are needed. These  include: Canes. Walkers. Scooters. Crutches. Turn on the lights when you go into a dark area. Replace any light bulbs as soon as they burn out. Set up your furniture so you have a clear path. Avoid moving your furniture around. If any of your floors are uneven, fix them. If there are any pets around you, be aware of where they are. Review your medicines with your doctor. Some medicines can make you feel dizzy. This can increase your chance of falling. Ask your doctor what other things that you can do to help prevent falls. This information is not intended to replace advice given to you by your health care provider. Make sure you discuss any questions you have with your health care provider. Document Released: 04/03/2009 Document Revised: 11/13/2015 Document Reviewed: 07/12/2014 Elsevier Interactive Patient Education  2017 [employer].

## 2021-03-18 NOTE — Progress Notes (Signed)
Subjective:   Marvin Salinas is a 75 y.o. male who presents for Medicare Annual/Subsequent preventive examination.  I connected with  Marvin Salinas on 03/18/21 by a telephone enabled telemedicine application and verified that I am speaking with the correct person using two identifiers.   I discussed the limitations of evaluation and management by telemedicine. The patient expressed understanding and agreed to proceed.    Review of Systems     Cardiac Risk Factors include: advanced age (>57mn, >>72women);diabetes mellitus;male gender;hypertension     Objective:    Today's Vitals   There is no height or weight on file to calculate BMI.  Advanced Directives 03/18/2021 02/04/2021 09/29/2020 07/01/2020 05/13/2020 05/09/2020 02/15/2020  Does Patient Have a Medical Advance Directive? _0  No No  Would patient like information on creating a medical advance directive? No - Patient declined No - Patient declined No - Patient declined No - Patient declined No - Patient declined No - Patient declined No - Patient declined    Current Medications (verified) Outpatient Encounter Medications as of 03/18/2021  Medication Sig   ascorbic acid (VITAMIN C) 500 MG tablet Take by mouth.   BD INSULIN SYRINGE U/F 31G X 5/16" 0.5 ML MISC USE TO INJECT INSULIN TWICE DAILY AS DIRECTED   blood glucose meter kit and supplies KIT Dispense based on patient and insurance preference. Use up to four times daily as directed.   Carboxymethylcell-Hypromellose (GENTEAL OP) Place 1 drop into both eyes daily as needed (dry eyes).   cholecalciferol (VITAMIN D3) 25 MCG (1000 UNIT) tablet Take 1,000 Units by mouth daily.   co-enzyme Q-10 30 MG capsule Take 30 mg by mouth daily.   dofetilide (TIKOSYN) 125 MCG capsule TAKE 1 CAPSULE BY MOUTH TWICE A DAY   ELIQUIS 5 MG TABS tablet TAKE 1 TABLET BY MOUTH TWICE A DAY   ezetimibe (ZETIA) 10 MG tablet Take 1 tablet (10 mg total) by mouth daily.   ferrous sulfate 325 (65  FE) MG tablet Take 1 tablet (325 mg total) by mouth every other day.   fluticasone (FLONASE) 50 MCG/ACT nasal spray Place 2 sprays into both nostrils daily.   insulin glargine (LANTUS) 100 UNIT/ML injection 20-25 units subcu injection in the morning and 30-35 units subcu injection before bed.   Lancets (ONETOUCH DELICA PLUS [id number] MISC ONETOUCH VERIO LANCETS USE TO TEST YOUR BLOOD SUGAR TWICE A DAY FINGER STICK   loratadine (CLARITIN) 10 MG tablet Take 10 mg by mouth daily as needed for allergies.   Magnesium 250 MG TABS Take by mouth.   metoprolol tartrate (LOPRESSOR) 25 MG tablet TAKE ONE-HALF TABLET BY  MOUTH TWICE DAILY   nitroGLYCERIN (NITROSTAT) 0.4 MG SL tablet PLACE 1 TABLET (0.4 MG TOTAL) UNDER THE TONGUE EVERY 5 (FIVE) MINUTES AS NEEDED FOR CHEST PAIN.   olmesartan (BENICAR) 20 MG tablet TAKE 1 AND 1/2 TABLETS DAILY BY MOUTH   ONETOUCH VERIO test strip USE WITH METER TWICE PER DAY DX DM TYPE 2   polyethylene glycol powder (GLYCOLAX/MIRALAX) 17 GM/SCOOP powder Take by mouth.   rosuvastatin (CRESTOR) 20 MG tablet Take 1 tablet (20 mg total) by mouth at bedtime.   Semaglutide (RYBELSUS) 7 MG TABS Take 1 tablet by mouth daily with breakfast.   No facility-administered encounter medications on file as of 03/18/2021.    Allergies (verified) Codeine, Lisinopril, Nsaids, and Latex   History: Past Medical History:  Diagnosis Date   Acute kidney injury superimposed on chronic kidney  disease ([place]) 10/12/2017   Atrial fibrillation with RVR ([place]) 10/27/2017   Back pain    Basilar artery stenosis    on chronic Plavix   Benign prostatic hypertrophy without urinary obstruction 04/17/2014   Carotid artery disease ([place]) 10/06/2010   Carotid US 04/2019: Bilat ICA 40-59; L subclavian stenosis  // Carotid US 11/21: Bilat ICA 40-59; bilateral subclavian stenosis    Cerebral vascular disease    with prior TIA's; followed by Dr. Erling Cruz   Depression, neurotic 11/21/2013   Diabetes mellitus    on  insulin   Enthesopathy of ankle and tarsus 09/04/2007   Overview:  Metatarsalgia    Enthesopathy of ankle and tarsus 09/04/2007   Overview:  Metatarsalgia  Formatting of this note might be different from the original. Metatarsalgia  10/1 IMO update   History of renal calculi    Hyperlipidemia    Hypertension    Ischemic heart disease    prior PCI to RCA in 1989. S/P PCI to LAD and OM in 1992. S/P PCI to first DX in 2000. S/P CABG x 3 in May 2011   Left hip pain 01/03/2020   OSA (obstructive sleep apnea) 01/11/2018   AHI 18.1 and SaO2 low 73%   PAD (peripheral artery disease) ([place]) 11/18/2017   ABIs/Arterial US 04/2019: R 1.21; L 0.66 // R SFA 30-49, stable > 50 CIA and EIA stenosis; L > 50 CIA stenosis (likely represents severe stenosis or short segment occlusion)   Peripheral neuropathy    Pneumonia Sept 2012   Sacroiliac joint dysfunction of left side 01/03/2020   Stroke ([place]) 04/16/2001   small right cerebellar infarct on 04/16/2001 at that time he was found to have proximal left vertebral artery, proximal left common carotid artery and both external carotid artery stenosis as well as intracranial stenosis involving mid basilar artery- 07/2011 add questionable TIA   Past Surgical History:  Procedure Laterality Date    NASAL ENDOSCOPY  01/11/2020   chronic rhinitis w/o evidence of acute sinusitis, bilateral inferior turbinate hypertrophy   ABDOMINAL AORTOGRAM W/LOWER EXTREMITY Bilateral 05/30/2019   Procedure: ABDOMINAL AORTOGRAM W/LOWER EXTREMITY;  Surgeon: Wellington Hampshire, MD;  Location: [facility];  Service: Cardiovascular;  Laterality: Bilateral;   ANGIOPLASTY  1989   right coronary artery   ANGIOPLASTY  1992   LAD and OM   ANGIOPLASTY  1998   First DX   BRAIN SURGERY     on prior records   CORONARY ARTERY BYPASS GRAFT  11/12/2009   LIMA to LAD, SVG to OM and SVG to RCA   CORONARY STENT PLACEMENT  2000   Stent to LAD/Circumflex with angioplasty to first diagonal     LEFT HEART CATH AND CORS/GRAFTS ANGIOGRAPHY N/A 10/13/2017   Procedure: LEFT HEART CATH AND CORS/GRAFTS ANGIOGRAPHY;  Surgeon: Nelva Bush, MD;  Location: [facility];  Service: Cardiovascular;  Laterality: N/A;   Family History  Problem Relation Age of Onset   Depression Mother    Early death Mother    Kidney disease Mother    Ovarian cancer Mother    Hypertension Father    Heart disease Father    Heart attack Father    Asthma Brother    Diabetes Brother    Stroke Other        Uncle   Diabetes Sister    Colon cancer Neg Hx    Rectal cancer Neg Hx    Stomach cancer Neg Hx    Esophageal cancer Neg  Hx    Social History   Socioeconomic History   Marital status: Divorced    Spouse name: Not on file   Number of children: 0   Years of education: GED   Highest education level: Not on file  Occupational History   Occupation: retired  Tobacco Use   Smoking status: Every Day    Packs/day: 0.25    Years: 54.00    Pack years: 13.50    Types: Cigarettes   Smokeless tobacco: Never  Vaping Use   Vaping Use: Never used  Substance and Sexual Activity   Alcohol use: Yes   Drug use: No   Sexual activity: Not Currently    Partners: Female  Other Topics Concern   Not on file  Social History Narrative   Marital status/children/pets: divorced.   Education/employment: retired, Patient has his GED. 9th grade education.    Safety:      -smoke alarm in the home:Yes     - wears seatbelt: Yes   Patient is right handed.   Patient does not drink any caffeine.   Social Determinants of Health   Financial Resource Strain: Low Risk    Difficulty of Paying Living Expenses: Not hard at all  Food Insecurity: No Food Insecurity   Worried About Charity fundraiser in the Last Year: Never true   [place] in the Last Year: Never true  Transportation Needs: No Transportation Needs   Lack of Transportation (Medical): No   Lack of Transportation (Non-Medical): No  Physical  Activity: Insufficiently Active   Days of Exercise per Week: 4 days   Minutes of Exercise per Session: 10 min  Stress: No Stress Concern Present   Feeling of Stress : Not at all  Social Connections: Socially Isolated   Frequency of Communication with Friends and Family: Twice a week   Frequency of Social Gatherings with Friends and Family: Twice a week   Attends Religious Services: Never   Printmaker: No   Attends Music therapist: Never   Marital Status: Divorced    Tobacco Counseling Ready to quit: Not Answered Counseling given: Not Answered   Clinical Intake:  Pre-visit preparation completed: Yes  Pain : No/denies pain Pain Orientation: Other (Comment)     Nutritional Risks: None Diabetes: Yes CBG done?: No Did pt. bring in CBG monitor from home?: No  How often do you need to have someone help you when you read instructions, pamphlets, or other written materials from your doctor or pharmacy?: 1 - Never  Diabetic?yes  Nutrition Risk Assessment:  Has the patient had any N/V/D within the last 2 months?  No  Does the patient have any non-healing wounds?  No  Has the patient had any unintentional weight loss or weight gain?  No   Diabetes:  Is the patient diabetic?  Yes  If diabetic, was a CBG obtained today?  No  Did the patient bring in their glucometer from home?  No  How often do you monitor your CBG's? 3 x daily.   Financial Strains and Diabetes Management:  Are you having any financial strains with the device, your supplies or your medication? No .  Does the patient want to be seen by Chronic Care Management for management of their diabetes?  No  Would the patient like to be referred to a Nutritionist or for Diabetic Management?  No   Diabetic Exams:  Diabetic Eye Exam: Completed . Overdue for diabetic  eye exam. Pt has been advised about the importance in completing this exam.   Diabetic Foot Exam: Completed .  Pt has been advised about the importance in completing this exam.     Interpreter Needed?: No  Information entered by :: Leroy Kennedy LPN   Activities of Daily Living In your present state of health, do you have any difficulty performing the following activities: 03/18/2021  Hearing? N  Vision? N  Difficulty concentrating or making decisions? N  Walking or climbing stairs? N  Dressing or bathing? N  Doing errands, shopping? N  Preparing Food and eating ? N  Using the Toilet? N  In the past six months, have you accidently leaked urine? N  Do you have problems with loss of bowel control? N  Managing your Medications? N  Managing your Finances? N  Housekeeping or managing your Housekeeping? N  Some recent data might be hidden    Patient Care Team: Ma Hillock, DO as PCP - General (Family Medicine) [name], Wonda Cheng, MD as PCP - Cardiology (Cardiology) Chesley Mires, MD as Consulting Physician (Pulmonary Disease) Constance Haw, MD as Consulting Physician (Cardiology) Calvert Cantor, MD as Consulting Physician (Ophthalmology) Garvin Fila, MD as Consulting Physician (Neurology) Love, Alyson Locket, MD as Consulting Physician (Neurology) [name], Wonda Cheng, MD as Consulting Physician (Cardiology) Michiel Cowboy, RN as [facility] Milus Banister, MD as Attending Physician (Gastroenterology) Celso Amy, NP as Nurse Practitioner (Nurse Practitioner)  Indicate any recent Medical Services you may have received from other than [facility] providers in the past year (date may be approximate).     Assessment:   This is a routine wellness examination for Marvin Salinas.  Hearing/Vision screen Hearing Screening - Comments:: No trouble hearing  Vision Screening - Comments:: Up to date [facility]  Dietary issues and exercise activities discussed: Current Exercise Habits: Home exercise routine, Type of exercise: strength training/weights, Time (Minutes): 15,  Frequency (Times/Week): 4, Weekly Exercise (Minutes/Week): 60, Intensity: Mild   Goals Addressed             This Visit's Progress    Patient Stated       Would like to increase walking  Is seeing rehab for feet  Neuropathy       Depression Screen PHQ 2/9 Scores 03/18/2021 04/29/2020 02/15/2020 01/24/2019  PHQ - 2 Score 0 0 0 0  PHQ- 9 Score - - - 0    Fall Risk Fall Risk  03/09/2021 11/06/2020 07/11/2020 04/29/2020 01/24/2019  Falls in the past year? 0 0 0 0 0  Number falls in past yr: 0 0 0 0 0  Injury with Fall? 0 0 0 0 0  Follow up Falls evaluation completed;Education provided;Falls prevention discussed Falls prevention discussed;Education provided;Falls evaluation completed Falls prevention discussed;Falls evaluation completed;Education provided Falls evaluation completed -    FALL RISK PREVENTION PERTAINING TO THE HOME:  Any stairs in or around the home? Yes  If so, are there any without handrails? No  Home free of loose throw rugs in walkways, pet beds, electrical cords, etc? Yes  Adequate lighting in your home to reduce risk of falls? Yes   ASSISTIVE DEVICES UTILIZED TO PREVENT FALLS:  Life alert? No  Use of a cane, walker or w/c? No  Grab bars in the bathroom? No  Shower chair or bench in shower? No  Elevated toilet seat or a handicapped toilet? No   TIMED UP AND GO:  Was the test performed?  No .    Cognitive Function:Normal cognitive status assessed by direct observation by this Nurse Health Advisor. No abnormalities found.          Immunizations Immunization History  Administered Date(s) Administered   Fluad Quad(high Dose 65+) 02/12/2019, 04/29/2020   Influenza Split 05/08/2013, 03/28/2017   Influenza, High Dose Seasonal PF 05/08/2013, 03/28/2017, 05/02/2018   Influenza,inj,Quad PF,6+ Mos 04/21/2016   Moderna Sars-Covid-2 Vaccination 08/27/2019, 10/03/2019   Pneumococcal Conjugate-13 07/13/2016   Pneumococcal Polysaccharide-23 02/21/2018   Tdap  02/15/2013   Zoster, Live 06/22/2015    TDAP status: Up to date  Flu Vaccine status: Due, Education has been provided regarding the importance of this vaccine. Advised may receive this vaccine at local pharmacy or Health Dept. Aware to provide a copy of the vaccination record if obtained from local pharmacy or Health Dept. Verbalized acceptance and understanding.  Pneumococcal vaccine status: Up to date  Covid-19 vaccine status: Information provided on how to obtain vaccines.   Qualifies for Shingles Vaccine? Yes   Zostavax completed No   Shingrix Completed?: Yes  Screening Tests Health Maintenance  Topic Date Due   COVID-19 Vaccine (3 - Moderna risk series) 03/19/2021 (Originally 10/31/2019)   Zoster Vaccines- Shingrix (1 of 2) 06/02/2021 (Originally 02/23/1965)   INFLUENZA VACCINE  09/18/2021 (Originally 01/19/2021)   OPHTHALMOLOGY EXAM  04/17/2021   FOOT EXAM  04/29/2021   HEMOGLOBIN A1C  08/31/2021   TETANUS/TDAP  02/16/2023   COLONOSCOPY (Pts 45-25yr Insurance coverage will need to be confirmed)  05/14/2023   HPV VACCINES  Aged Out   Hepatitis C Screening  Discontinued    Health Maintenance  There are no preventive care reminders to display for this patient.  Colorectal cancer screening: No longer required.   Lung Cancer Screening: (Low Dose CT Chest recommended if Age 76-80years, 30 pack-year currently smoking OR have quit w/in 15years.) does qualify.   Lung Cancer Screening Referral:   Additional Screening:  Hepatitis C Screening: does not qualify; Completed   Vision Screening: Recommended annual ophthalmology exams for early detection of glaucoma and other disorders of the eye. Is the patient up to date with their annual eye exam?  Yes  Who is the provider or what is the name of the office in which the patient attends annual eye exams? Dr. DBing PlumeIf pt is not established with a provider, would they like to be referred to a provider to establish care? No .    Dental Screening: Recommended annual dental exams for proper oral hygiene  Community Resource Referral / Chronic Care Management: CRR required this visit?  No   CCM required this visit?  No      Plan:     I have personally reviewed and noted the following in the patient's chart:   Medical and social history Use of alcohol, tobacco or illicit drugs  Current medications and supplements including opioid prescriptions. Patient is not currently taking opioid prescriptions. Functional ability and status Nutritional status Physical activity Advanced directives List of other physicians Hospitalizations, surgeries, and ER visits in previous 12 months Vitals Screenings to include cognitive, depression, and falls Referrals and appointments  In addition, I have reviewed and discussed with patient certain preventive protocols, quality metrics, and best practice recommendations. A written personalized care plan for preventive services as well as general preventive health recommendations were provided to patient.     JLeroy Kennedy LPN   [id number]  Nurse Notes:

## 2021-03-19 DIAGNOSIS — G2581 Restless legs syndrome: Secondary | ICD-10-CM | POA: Diagnosis not present

## 2021-03-19 DIAGNOSIS — E1049 Type 1 diabetes mellitus with other diabetic neurological complication: Secondary | ICD-10-CM | POA: Diagnosis not present

## 2021-03-19 DIAGNOSIS — R278 Other lack of coordination: Secondary | ICD-10-CM | POA: Diagnosis not present

## 2021-03-19 DIAGNOSIS — R202 Paresthesia of skin: Secondary | ICD-10-CM | POA: Diagnosis not present

## 2021-03-19 DIAGNOSIS — M79604 Pain in right leg: Secondary | ICD-10-CM | POA: Diagnosis not present

## 2021-03-19 DIAGNOSIS — I10 Essential (primary) hypertension: Secondary | ICD-10-CM | POA: Diagnosis not present

## 2021-03-20 DIAGNOSIS — R278 Other lack of coordination: Secondary | ICD-10-CM | POA: Diagnosis not present

## 2021-03-20 DIAGNOSIS — R202 Paresthesia of skin: Secondary | ICD-10-CM | POA: Diagnosis not present

## 2021-03-20 DIAGNOSIS — E1049 Type 1 diabetes mellitus with other diabetic neurological complication: Secondary | ICD-10-CM | POA: Diagnosis not present

## 2021-03-20 DIAGNOSIS — I10 Essential (primary) hypertension: Secondary | ICD-10-CM | POA: Diagnosis not present

## 2021-03-20 DIAGNOSIS — G2581 Restless legs syndrome: Secondary | ICD-10-CM | POA: Diagnosis not present

## 2021-03-20 DIAGNOSIS — M79604 Pain in right leg: Secondary | ICD-10-CM | POA: Diagnosis not present

## 2021-03-26 ENCOUNTER — Other Ambulatory Visit: Payer: Self-pay | Admitting: *Deleted

## 2021-03-26 DIAGNOSIS — R778 Other specified abnormalities of plasma proteins: Secondary | ICD-10-CM

## 2021-03-27 ENCOUNTER — Inpatient Hospital Stay: Payer: Medicare Other | Attending: Family

## 2021-03-27 ENCOUNTER — Other Ambulatory Visit: Payer: Self-pay

## 2021-03-27 DIAGNOSIS — D500 Iron deficiency anemia secondary to blood loss (chronic): Secondary | ICD-10-CM | POA: Insufficient documentation

## 2021-03-27 DIAGNOSIS — R778 Other specified abnormalities of plasma proteins: Secondary | ICD-10-CM

## 2021-03-27 DIAGNOSIS — K922 Gastrointestinal hemorrhage, unspecified: Secondary | ICD-10-CM | POA: Diagnosis not present

## 2021-03-28 LAB — IGG, IGA, IGM
IgA: 55 mg/dL — ABNORMAL LOW (ref 61–437)
IgG (Immunoglobin G), Serum: 491 mg/dL — ABNORMAL LOW (ref 603–1613)
IgM (Immunoglobulin M), Srm: 18 mg/dL (ref 15–143)

## 2021-03-30 LAB — KAPPA/LAMBDA LIGHT CHAINS
Kappa free light chain: 24.4 mg/L — ABNORMAL HIGH (ref 3.3–19.4)
Kappa, lambda light chain ratio: 0.03 — ABNORMAL LOW (ref 0.26–1.65)
Lambda free light chains: 827.5 mg/L — ABNORMAL HIGH (ref 5.7–26.3)

## 2021-03-31 LAB — PROTEIN ELECTROPHORESIS, SERUM
A/G Ratio: 1.7 (ref 0.7–1.7)
Albumin ELP: 3.8 g/dL (ref 2.9–4.4)
Alpha-1-Globulin: 0.2 g/dL (ref 0.0–0.4)
Alpha-2-Globulin: 0.8 g/dL (ref 0.4–1.0)
Beta Globulin: 0.9 g/dL (ref 0.7–1.3)
Gamma Globulin: 0.4 g/dL (ref 0.4–1.8)
Globulin, Total: 2.3 g/dL (ref 2.2–3.9)
Total Protein ELP: 6.1 g/dL (ref 6.0–8.5)

## 2021-04-01 ENCOUNTER — Other Ambulatory Visit: Payer: Self-pay | Admitting: Cardiovascular Disease

## 2021-04-22 ENCOUNTER — Other Ambulatory Visit: Payer: Self-pay

## 2021-04-22 ENCOUNTER — Ambulatory Visit (INDEPENDENT_AMBULATORY_CARE_PROVIDER_SITE_OTHER): Payer: Medicare Other

## 2021-04-22 DIAGNOSIS — Z23 Encounter for immunization: Secondary | ICD-10-CM

## 2021-04-24 ENCOUNTER — Other Ambulatory Visit: Payer: Self-pay

## 2021-04-24 ENCOUNTER — Ambulatory Visit (HOSPITAL_COMMUNITY)
Admission: RE | Admit: 2021-04-24 | Discharge: 2021-04-24 | Disposition: A | Discharge status: Home / Self Care | Payer: Medicare Other | Source: Ambulatory Visit | Attending: Cardiology | Admitting: Cardiology

## 2021-04-24 DIAGNOSIS — I6523 Occlusion and stenosis of bilateral carotid arteries: Secondary | ICD-10-CM | POA: Insufficient documentation

## 2021-04-26 ENCOUNTER — Encounter: Payer: Self-pay | Admitting: Physician Assistant

## 2021-04-26 ENCOUNTER — Other Ambulatory Visit: Payer: Self-pay | Admitting: Cardiovascular Disease

## 2021-04-26 ENCOUNTER — Other Ambulatory Visit: Payer: Self-pay | Admitting: Family Medicine

## 2021-04-27 ENCOUNTER — Other Ambulatory Visit: Payer: Self-pay | Admitting: *Deleted

## 2021-04-27 DIAGNOSIS — I779 Disorder of arteries and arterioles, unspecified: Secondary | ICD-10-CM

## 2021-04-27 DIAGNOSIS — I6523 Occlusion and stenosis of bilateral carotid arteries: Secondary | ICD-10-CM

## 2021-05-05 ENCOUNTER — Other Ambulatory Visit: Payer: Self-pay

## 2021-05-05 ENCOUNTER — Ambulatory Visit: Payer: Medicare Other | Admitting: Cardiovascular Disease

## 2021-05-05 ENCOUNTER — Encounter: Payer: Self-pay | Admitting: Cardiovascular Disease

## 2021-05-05 VITALS — BP 122/60 | HR 64 | Ht 68.0 in | Wt 169.8 lb

## 2021-05-05 DIAGNOSIS — I2510 Atherosclerotic heart disease of native coronary artery without angina pectoris: Secondary | ICD-10-CM | POA: Diagnosis not present

## 2021-05-05 DIAGNOSIS — I779 Disorder of arteries and arterioles, unspecified: Secondary | ICD-10-CM | POA: Diagnosis not present

## 2021-05-05 DIAGNOSIS — I739 Peripheral vascular disease, unspecified: Secondary | ICD-10-CM

## 2021-05-05 DIAGNOSIS — E785 Hyperlipidemia, unspecified: Secondary | ICD-10-CM

## 2021-05-05 NOTE — Patient Instructions (Signed)
Medication Instructions:  Your physician recommends that you continue on your current medications as directed. Please refer to the Current Medication list given to you today.  *If you need a refill on your cardiac medications before your next appointment, please call your pharmacy*  Testing/Procedures: Your physician has requested that you have an ankle brachial index (ABI) (due in June). During this test an ultrasound and blood pressure cuff are used to evaluate the arteries that supply the arms and legs with blood. Allow thirty minutes for this exam. There are no restrictions or special instructions.  Your physician has requested that you have an aorto-iliac duplex (due in June). During this test, an ultrasound is used to evaluate the aorta and iliac arteries. Do not eat after midnight the day before and avoid carbonated beverages  Follow-Up: At [facility], you and your health needs are our priority.  As part of our continuing mission to provide you with exceptional heart care, we have created designated Provider Care Teams.  These Care Teams include your primary Cardiologist (physician) and Advanced Practice Providers (APPs -  Physician Assistants and Nurse Practitioners) who all work together to provide you with the care you need, when you need it.  We recommend signing up for the patient portal called "MyChart".  Sign up information is provided on this After Visit Summary.  MyChart is used to connect with patients for Virtual Visits (Telemedicine).  Patients are able to view lab/test results, encounter notes, upcoming appointments, etc.  Non-urgent messages can be sent to your provider as well.   To learn more about what you can do with MyChart, go to [email or web address].    Your next appointment:   12 month(s)  The format for your next appointment:   In Person  Provider:   Dr. Fletcher Anon

## 2021-05-05 NOTE — Progress Notes (Signed)
Cardiology Office Note   Date:  05/05/2021   ID:  Marvin Salinas, DOB 02/16/1946, MRN [id number]  PCP:  Ma Hillock, DO  Cardiologist:  Dr. Acie Fredrickson  No chief complaint on file.     History of Present Illness: Marvin Salinas is a 75 y.o. male who is here today for a follow-up visit regarding peripheral arterial disease.  He has known history of coronary artery disease status post CABG in 2001, paroxysmal atrial fibrillation on anticoagulation, essential hypertension, carotid disease, hyperlipidemia, diabetes and prior TIAs. Patient has known history of peripheral arterial disease .  He does have underlying chronic kidney disease with creatinine between 1.6-2.  The patient is followed for peripheral arterial disease with previous severe claudication.  Angiography in December 2020 showed heavily calcified subtotal occlusion of the left common iliac artery with moderate disease affecting the right common iliac artery.  I performed successful angioplasty and and balloon expandable stent placement to the left common iliac artery.   He has known history of low back pain and bilateral knee joint pain.    The patient was diagnosed with severe lumbar radiculopathy but did not improve significantly with steroid injections.   He has been doing reasonably well overall with no leg claudication.  No chest pain or shortness of breath.  He has intermittent palpitations that does not last long.  Past Medical History:  Diagnosis Date   Acute kidney injury superimposed on chronic kidney disease ([place]) 10/12/2017   Atrial fibrillation with RVR ([place]) 10/27/2017   Back pain    Basilar artery stenosis    on chronic Plavix   Benign prostatic hypertrophy without urinary obstruction 04/17/2014   Carotid artery disease ([place]) 10/06/2010   Carotid US 04/2019: Bilat ICA 40-59; L subclavian stenosis  // Carotid US 11/21: Bilat ICA 40-59; bilateral subclavian stenosis // Carotid US 11/22: Bilateral ICA 40-59;  left subclavian stenosis   Cerebral vascular disease    with prior TIA's; followed by Dr. Erling Cruz   Depression, neurotic 11/21/2013   Diabetes mellitus    on insulin   Enthesopathy of ankle and tarsus 09/04/2007   Overview:  Metatarsalgia    Enthesopathy of ankle and tarsus 09/04/2007   Overview:  Metatarsalgia  Formatting of this note might be different from the original. Metatarsalgia  10/1 IMO update   History of renal calculi    Hyperlipidemia    Hypertension    Ischemic heart disease    prior PCI to RCA in 1989. S/P PCI to LAD and OM in 1992. S/P PCI to first DX in 2000. S/P CABG x 3 in May 2011   Left hip pain 01/03/2020   OSA (obstructive sleep apnea) 01/11/2018   AHI 18.1 and SaO2 low 73%   PAD (peripheral artery disease) ([place]) 11/18/2017   ABIs/Arterial US 04/2019: R 1.21; L 0.66 // R SFA 30-49, stable > 50 CIA and EIA stenosis; L > 50 CIA stenosis (likely represents severe stenosis or short segment occlusion)   Peripheral neuropathy    Pneumonia 02/2011   Sacroiliac joint dysfunction of left side 01/03/2020   Stroke ([place]) 04/16/2001   small right cerebellar infarct on 04/16/2001 at that time he was found to have proximal left vertebral artery, proximal left common carotid artery and both external carotid artery stenosis as well as intracranial stenosis involving mid basilar artery- 07/2011 add questionable TIA    Past Surgical History:  Procedure Laterality Date    NASAL ENDOSCOPY  01/11/2020   chronic  rhinitis w/o evidence of acute sinusitis, bilateral inferior turbinate hypertrophy   ABDOMINAL AORTOGRAM W/LOWER EXTREMITY Bilateral 05/30/2019   Procedure: ABDOMINAL AORTOGRAM W/LOWER EXTREMITY;  Surgeon: Wellington Hampshire, MD;  Location: [facility];  Service: Cardiovascular;  Laterality: Bilateral;   ANGIOPLASTY  1989   right coronary artery   ANGIOPLASTY  1992   LAD and OM   ANGIOPLASTY  1998   First DX   BRAIN SURGERY     on prior records   CORONARY ARTERY  BYPASS GRAFT  11/12/2009   LIMA to LAD, SVG to OM and SVG to RCA   CORONARY STENT PLACEMENT  2000   Stent to LAD/Circumflex with angioplasty to first diagonal    LEFT HEART CATH AND CORS/GRAFTS ANGIOGRAPHY N/A 10/13/2017   Procedure: LEFT HEART CATH AND CORS/GRAFTS ANGIOGRAPHY;  Surgeon: Nelva Bush, MD;  Location: [facility];  Service: Cardiovascular;  Laterality: N/A;     Current Outpatient Medications  Medication Sig Dispense Refill   ascorbic acid (VITAMIN C) 500 MG tablet Take by mouth.     BD INSULIN SYRINGE U/F 31G X 5/16" 0.5 ML MISC USE TO INJECT INSULIN TWICE DAILY AS DIRECTED 200 each 1   blood glucose meter kit and supplies KIT Dispense based on patient and insurance preference. Use up to four times daily as directed. 1 each 0   Carboxymethylcell-Hypromellose (GENTEAL OP) Place 1 drop into both eyes daily as needed (dry eyes).     cholecalciferol (VITAMIN D3) 25 MCG (1000 UNIT) tablet Take 1,000 Units by mouth daily.     co-enzyme Q-10 30 MG capsule Take 30 mg by mouth daily.     dofetilide (TIKOSYN) 125 MCG capsule TAKE 1 CAPSULE BY MOUTH TWICE A DAY 180 capsule 1   ELIQUIS 5 MG TABS tablet TAKE 1 TABLET BY MOUTH TWICE A DAY 180 tablet 1   ezetimibe (ZETIA) 10 MG tablet Take 1 tablet (10 mg total) by mouth daily. 90 tablet 3   ferrous sulfate 325 (65 FE) MG tablet Take 1 tablet (325 mg total) by mouth every other day.     fluticasone (FLONASE) 50 MCG/ACT nasal spray Place 2 sprays into both nostrils daily.     insulin glargine (LANTUS) 100 UNIT/ML injection 20-25 units subcu injection in the morning and 30-35 units subcu injection before bed. 70 mL 5   Lancets (ONETOUCH DELICA PLUS [id number]) MISC ONETOUCH VERIO LANCETS USE TO TEST YOUR BLOOD SUGAR TWICE A DAY FINGER STICK     loratadine (CLARITIN) 10 MG tablet Take 10 mg by mouth daily as needed for allergies.     Magnesium 250 MG TABS Take by mouth.     metoprolol tartrate (LOPRESSOR) 25 MG tablet Take 0.5 tablets  (12.5 mg total) by mouth 2 (two) times daily. 90 tablet 3   nitroGLYCERIN (NITROSTAT) 0.4 MG SL tablet PLACE 1 TABLET (0.4 MG TOTAL) UNDER THE TONGUE EVERY 5 (FIVE) MINUTES AS NEEDED FOR CHEST PAIN. 25 tablet 12   olmesartan (BENICAR) 20 MG tablet TAKE 1 AND 1/2 TABLETS DAILY BY MOUTH 135 tablet 1   ONETOUCH VERIO test strip USE UP TO 4 TIMES DAILY AS DIRECTED 100 strip 3   polyethylene glycol powder (GLYCOLAX/MIRALAX) 17 GM/SCOOP powder Take by mouth.     rosuvastatin (CRESTOR) 20 MG tablet Take 1 tablet (20 mg total) by mouth at bedtime. 90 tablet 3   Semaglutide (RYBELSUS) 7 MG TABS Take 1 tablet by mouth daily with breakfast. 90 tablet 1  No current facility-administered medications for this visit.    Allergies:   Codeine, Lisinopril, Nsaids, and Latex    Social History:  The patient  reports that he has been smoking cigarettes. He has a 13.50 pack-year smoking history. He has never used smokeless tobacco. He reports current alcohol use. He reports that he does not use drugs.   Family History:  The patient's family history includes Asthma in his brother; Depression in his mother; Diabetes in his brother and sister; Early death in his mother; Heart attack in his father; Heart disease in his father; Hypertension in his father; Kidney disease in his mother; Ovarian cancer in his mother; Stroke in an other family member.    ROS:  Please see the history of present illness.   Otherwise, review of systems are positive for none.   All other systems are reviewed and negative.    PHYSICAL EXAM: VS:  BP 122/60   Pulse 64   Ht '5\' 8"'  (1.727 m)   Wt 169 lb 12.8 oz (77 kg)   SpO2 98%   BMI 25.82 kg/m  , BMI Body mass index is 25.82 kg/m. GEN: Well nourished, well developed, in no acute distress  HEENT: normal  Neck: no JVD,  or masses .  Bilateral carotid bruits. Cardiac: RRR; no murmurs, rubs, or gallops,no edema  Respiratory:  clear to auscultation bilaterally, normal work of  breathing GI: soft, nontender, nondistended, + BS MS: no deformity or atrophy  Skin: warm and dry, no rash Neuro:  Strength and sensation are intact Psych: euthymic mood, full affect  Vascular: Femoral pulses are slightly diminished bilaterally.    EKG:  EKG is not ordered today.   Recent Labs: 10/23/2020: TSH 0.73 02/04/2021: ALT 20; BUN 21; Creatinine 1.35; Hemoglobin 15.4; Platelet Count 231; Potassium 4.6; Sodium 139    Lipid Panel    Component Value Date/Time   CHOL 137 10/23/2020 1112   CHOL 110 10/01/2019 0944   TRIG 125.0 10/23/2020 1112   HDL 44.30 10/23/2020 1112   HDL 42 10/01/2019 0944   CHOLHDL 3 10/23/2020 1112   VLDL 25.0 10/23/2020 1112   LDLCALC 68 10/23/2020 1112   LDLCALC 36 10/01/2019 0944      Wt Readings from Last 3 Encounters:  05/05/21 169 lb 12.8 oz (77 kg)  03/03/21 168 lb 3.2 oz (76.3 kg)  03/02/21 169 lb 3.2 oz (76.7 kg)        PAD Screen 11/30/2016  Previous PAD dx? Yes  Previous surgical procedure? No  Pain with walking? Yes  Subsides with rest? No  Feet/toe relief with dangling? Yes  Painful, non-healing ulcers? No  Extremities discolored? No      ASSESSMENT AND PLAN:  1.  Peripheral arterial disease: Status post  stent placement to the left common iliac artery for subtotal occlusion. Currently with no claudication. He has neuropathic pain related to his lumbar radiculopathy.  I reviewed most recent Doppler studies with him which were done in June and showed noncompressible vessels with patent left iliac stent with stable moderately elevated velocities.  He does have borderline stenosis affecting the right common iliac artery but this has been stable and currently with no claudication.  Continue medical therapy and repeat studies in June of next year.  2. Coronary artery disease without angina: Continue medical therapy.  Clopidogrel was discontinued last year given that he is on anticoagulation.  His anemia has resolved since  then.  3. Tobacco use: He quit smoking last year and  then smoked a few cigarettes around the holidays but quit again.  4. Carotid disease: Carotid Doppler progression to 40 to 59% range.  This has been stable on most recent Doppler.  Continue treatment of risk factors.  5. Hyperlipidemia: I reviewed most recent lipid profile which showed an LDL of 68.  This is at target.  Continue rosuvastatin and Zetia.  6.  Persistent atrial fibrillation: Maintaining in sinus rhythm with dofetilide.  On long-term anticoagulation with Eliquis.   Disposition:   FU with me in 12 month.   Signed,  Kathlyn Sacramento, MD  05/05/2021 10:19 AM    [place]

## 2021-05-20 ENCOUNTER — Other Ambulatory Visit: Payer: Self-pay

## 2021-05-20 ENCOUNTER — Inpatient Hospital Stay: Payer: Medicare Other | Attending: Family

## 2021-05-20 DIAGNOSIS — R779 Abnormality of plasma protein, unspecified: Secondary | ICD-10-CM | POA: Insufficient documentation

## 2021-05-20 DIAGNOSIS — R778 Other specified abnormalities of plasma proteins: Secondary | ICD-10-CM

## 2021-05-22 LAB — UIFE/LIGHT CHAINS/TP QN, 24-HR UR
FR KAPPA LT CH,24HR: 83.82 mg/24 hr
FR LAMBDA LT CH,24HR: 1370.53 mg/24 hr
Free Kappa Lt Chains,Ur: 69.85 mg/L (ref 1.17–86.46)
Free Kappa/Lambda Ratio: 0.06 — ABNORMAL LOW (ref 1.83–14.26)
Free Lambda Lt Chains,Ur: 1142.11 mg/L — ABNORMAL HIGH (ref 0.27–15.21)
Total Protein, Urine-Ur/day: 433 mg/24 hr — ABNORMAL HIGH (ref 30–150)
Total Protein, Urine: 36.1 mg/dL
Total Volume: 1200

## 2021-05-25 DIAGNOSIS — E113293 Type 2 diabetes mellitus with mild nonproliferative diabetic retinopathy without macular edema, bilateral: Secondary | ICD-10-CM | POA: Diagnosis not present

## 2021-05-25 DIAGNOSIS — H35033 Hypertensive retinopathy, bilateral: Secondary | ICD-10-CM | POA: Diagnosis not present

## 2021-05-25 DIAGNOSIS — H25813 Combined forms of age-related cataract, bilateral: Secondary | ICD-10-CM | POA: Diagnosis not present

## 2021-05-25 DIAGNOSIS — H02834 Dermatochalasis of left upper eyelid: Secondary | ICD-10-CM | POA: Diagnosis not present

## 2021-05-25 LAB — HM DIABETES EYE EXAM

## 2021-06-02 DIAGNOSIS — E1129 Type 2 diabetes mellitus with other diabetic kidney complication: Secondary | ICD-10-CM | POA: Diagnosis not present

## 2021-06-02 DIAGNOSIS — I129 Hypertensive chronic kidney disease with stage 1 through stage 4 chronic kidney disease, or unspecified chronic kidney disease: Secondary | ICD-10-CM | POA: Diagnosis not present

## 2021-06-02 DIAGNOSIS — R809 Proteinuria, unspecified: Secondary | ICD-10-CM | POA: Diagnosis not present

## 2021-06-02 DIAGNOSIS — E1122 Type 2 diabetes mellitus with diabetic chronic kidney disease: Secondary | ICD-10-CM | POA: Diagnosis not present

## 2021-06-02 DIAGNOSIS — N189 Chronic kidney disease, unspecified: Secondary | ICD-10-CM | POA: Diagnosis not present

## 2021-06-08 ENCOUNTER — Other Ambulatory Visit: Payer: Self-pay | Admitting: *Deleted

## 2021-06-08 NOTE — Patient Outreach (Signed)
[facility]) Care Management  06/08/2021  Marvin Salinas December 16, 1945 [id number]   Unsuccessful outreach attempt made to patient. Patient answered the phone and stated that he would not be able to speak today. He did request that this nurse call back at a later date.   Plan: RN Health Coach will call patient within the month of January.  Emelia Loron RN, BSN [place] [phone] [email or web address]

## 2021-06-09 DIAGNOSIS — R809 Proteinuria, unspecified: Secondary | ICD-10-CM | POA: Diagnosis not present

## 2021-06-09 DIAGNOSIS — N189 Chronic kidney disease, unspecified: Secondary | ICD-10-CM | POA: Diagnosis not present

## 2021-06-09 DIAGNOSIS — E1122 Type 2 diabetes mellitus with diabetic chronic kidney disease: Secondary | ICD-10-CM | POA: Diagnosis not present

## 2021-06-09 DIAGNOSIS — E1129 Type 2 diabetes mellitus with other diabetic kidney complication: Secondary | ICD-10-CM | POA: Diagnosis not present

## 2021-06-09 DIAGNOSIS — D472 Monoclonal gammopathy: Secondary | ICD-10-CM | POA: Diagnosis not present

## 2021-06-09 DIAGNOSIS — I129 Hypertensive chronic kidney disease with stage 1 through stage 4 chronic kidney disease, or unspecified chronic kidney disease: Secondary | ICD-10-CM | POA: Diagnosis not present

## 2021-06-29 ENCOUNTER — Other Ambulatory Visit: Payer: Self-pay | Admitting: *Deleted

## 2021-06-29 NOTE — Patient Instructions (Signed)
Visit Information  Thank you for taking time to visit with me today. Please don't hesitate to contact me if I can be of assistance to you before our next scheduled telephone appointment.  Following are the goals we discussed today:  Patient Goals/Self-Care Activities: Take all medications as prescribed Attend all scheduled provider appointments keep appointment with eye doctor check blood sugar at prescribed times: twice daily check feet daily for cuts, sores or redness enter blood sugar readings and medication or insulin into daily log take the blood sugar log to all doctor visits set goal weight trim toenails straight across drink 6 to 8 glasses of water each day fill half of plate with vegetables limit fast food meals to no more than 1 per week manage portion size prepare main meal at home 3 to 5 days each week wear comfortable, well-fitting shoes Continue to stay physically active by maintaining your home and yard Continue to limit sugar, carbohydrates, and sodium in your diet    The patient verbalized understanding of instructions, educational materials, and care plan provided today and agreed to receive a mailed copy of patient instructions, educational materials, and care plan.   Telephone follow up appointment with care management team member scheduled [id number]  Emelia Loron RN, [place] [phone] Lillien [email or web address]

## 2021-06-29 NOTE — Patient Outreach (Signed)
[facility]) Care Management  06/29/2021  Marvin Salinas Nov 04, 1945 [id number]  [facility]) Care Management RN Health Coach Note   06/29/2021 Name:  Marvin Salinas MRN:  [id number] DOB:  17-Apr-1946  Summary: Patient reports that his diabetes is currently controlled. His last A1c was 6.2 on 03/03/21. Patient explains that he has maintained his weight at around 160 pounds and is adhering to a diabetic diet. Patient reports that his brother and his sister recently passed away. Nurse offered sincere condolences. Patient states that he also recently broke up with his long-term girlfriend. Patient declined grief and emotional counseling at this time adding that he is alright and that it would just take time to get over the loss. Patient states he is supported by his other siblings. Patient denies any falls and states his home environment is safe. Patient did not have any further questions or concerns today and did confirm that he has this nurse's contact number to call her if needed.   Recommendations/Changes made from today's visit: check blood sugar at prescribed times: twice daily Continue to  adhere to a diabetic diet and heart healthy diet Continue to stay active by maintaining home and yard  Subjective: Marvin Salinas is an 76 y.o. year old male who is a primary patient of Kuneff, Renee A, DO. The care management team was consulted for assistance with care management and/or care coordination needs.    RN Health Coach completed Telephone Visit today.   Objective:  Medications Reviewed Today     Reviewed by Michiel Cowboy, RN (Registered Nurse) on 06/29/21 at 8  Med List Status: <None>   Medication Order Taking? Sig Documenting Provider Last Dose Status Informant  ascorbic acid (VITAMIN C) 500 MG tablet [id number] Yes Take by mouth. [provider] Taking Active   BD INSULIN SYRINGE U/F 31G X 5/16" 0.5 ML MISC [id number] Yes USE TO INJECT INSULIN  TWICE DAILY AS DIRECTED Kuneff, Renee A, DO Taking Active   blood glucose meter kit and supplies KIT [id number] Yes Dispense based on patient and insurance preference. Use up to four times daily as directed. Raoul Pitch, Renee A, DO Taking Active   Carboxymethylcell-Hypromellose (GENTEAL OP) [id number] Yes Place 1 drop into both eyes daily as needed (dry eyes). [provider] Taking Active Self  cholecalciferol (VITAMIN D3) 25 MCG (1000 UNIT) tablet [id number] Yes Take 1,000 Units by mouth daily. [provider] Taking Active Self  co-enzyme Q-10 30 MG capsule [id number] Yes Take 30 mg by mouth daily. [provider] Taking Active Self  dofetilide (TIKOSYN) 125 MCG capsule [id number] Yes TAKE 1 CAPSULE BY MOUTH TWICE A DAY [name], Wonda Cheng, MD Taking Active   ELIQUIS 5 MG TABS tablet [id number] Yes TAKE 1 TABLET BY MOUTH TWICE A DAY [name], Wonda Cheng, MD Taking Active   ezetimibe (ZETIA) 10 MG tablet [id number] Yes Take 1 tablet (10 mg total) by mouth daily. Kuneff, Renee A, DO Taking Active   ferrous sulfate 325 (65 FE) MG tablet [id number] Yes Take 1 tablet (325 mg total) by mouth every other day. Milus Banister, MD Taking Active   fluticasone [facility]) 50 MCG/ACT nasal spray [id number] Yes Place 2 sprays into both nostrils daily. [provider] Taking Active   insulin glargine (LANTUS) 100 UNIT/ML injection [id number] Yes 20-25 units subcu injection in the morning and 30-35 units subcu injection before bed. Ma Hillock, DO Taking Active   Lancets ([place] [id number]) [place] [id number]  Yes ONETOUCH VERIO LANCETS USE TO TEST YOUR BLOOD SUGAR TWICE A DAY FINGER STICK [provider] Taking Active Self  loratadine (CLARITIN) 10 MG tablet [id number] Yes Take 10 mg by mouth daily as needed for allergies. [provider] Taking Active Self  Magnesium 250 MG TABS [id number] Yes Take by mouth. [provider] Taking Active   metoprolol tartrate  (LOPRESSOR) 25 MG tablet [id number] Yes Take 0.5 tablets (12.5 mg total) by mouth 2 (two) times daily. [name], Wonda Cheng, MD Taking Active   nitroGLYCERIN (NITROSTAT) 0.4 MG SL tablet [id number] Yes PLACE 1 TABLET (0.4 MG TOTAL) UNDER THE TONGUE EVERY 5 (FIVE) MINUTES AS NEEDED FOR CHEST PAIN. Liliane Shi, PA-C Taking Active            Med Note Lynn Ito   Mon Mar 02, 2021  8:04 AM)    olmesartan (BENICAR) 20 MG tablet [id number] Yes TAKE 1 AND 1/2 TABLETS DAILY BY MOUTH Kuneff, Renee A, DO Taking Active            Med Note Laretta Alstrom, Kaylin Marcon A   Mon Jun 29, 2021  4:01 PM)    olmesartan (BENICAR) 40 MG tablet [id number] No Take 40 mg by mouth daily.  Patient not taking: Reported on 06/29/2021   [provider] Not Taking Active            Med Note Laretta Alstrom, Celestia Duva A   Mon Jun 29, 2421  5:36 PM) duplicate  [facility] VERIO test strip [id number] Yes USE UP TO 4 TIMES DAILY AS DIRECTED McGowen, Adrian Blackwater, MD Taking Active   polyethylene glycol powder (GLYCOLAX/MIRALAX) 17 GM/SCOOP powder [id number] Yes Take by mouth. [provider] Taking Active   rosuvastatin (CRESTOR) 20 MG tablet [id number] Yes Take 1 tablet (20 mg total) by mouth at bedtime. Raoul Pitch, Renee A, DO Taking Active   Semaglutide (RYBELSUS) 7 MG TABS [id number] Yes Take 1 tablet by mouth daily with breakfast. Raoul Pitch, Renee A, DO Taking Active              SDOH:  (Social Determinants of Health) assessments and interventions performed:    Care Plan  Review of patient past medical history, allergies, medications, health status, including review of consultants reports, laboratory and other test data, was performed as part of comprehensive evaluation for care management services.   Care Plan : Wellness (Adult)  Updates made by Michiel Cowboy, RN since 06/29/2021 12:00 AM     Problem: Health Literacy (Wellness) Resolved 06/29/2021  Priority: Low     Care Plan : General Plan of Care (Adult)  Updates made by Michiel Cowboy, RN since  06/29/2021 12:00 AM     Problem: Coping Skills (General Plan of Care) Resolved 06/29/2021  Priority: Medium     Care Plan : Diabetes Type 2 (Adult)  Updates made by Michiel Cowboy, RN since 06/29/2021 12:00 AM     Problem: Glycemic Management (Diabetes, Type 2) Resolved 06/29/2021  Priority: High     Long-Range Goal: Glycemic Management Optimized Completed 06/29/2021  Start Date: 05/02/2020  Expected End Date: 05/20/2021  Recent Progress: Not on track  Note:   Resolving due to duplicate goal  Evidence-based guidance:  Anticipate A1C testing (point-of-care) every 3 to 6 months based on goal attainment.  Review mutually-set A1C goal or target range.  Anticipate use of antihyperglycemic with or without insulin and periodic adjustments; consider active involvement of pharmacist.  Provide medical nutrition therapy and development of individualized eating.  Compare self-reported symptoms of hypo or hyperglycemia to blood glucose levels, diet and fluid intake, current medications, psychosocial and physiologic stressors, change in activity and barriers to care adherence.  Promote self-monitoring of blood glucose levels.  Assess and address barriers to management plan, such as food insecurity, age, developmental ability, depression, anxiety, fear of hypoglycemia or weight gain, as well as medication cost, side effects and complicated regimen.  Consider referral to community-based diabetes education program, visiting nurse, community health worker or health coach.  Encourage regular dental care for treatment of periodontal disease; refer to dental provider when needed.   Notes:     Task: Alleviate Barriers to Glycemic Management Completed 06/29/2021  Due Date: 05/20/2021  Note:   Care Management Activities:    - barriers to adherence to treatment plan identified - blood glucose monitoring encouraged - blood glucose readings reviewed - mutual A1C goal set or reviewed - self-awareness of  signs/symptoms of hypo or hyperglycemia encouraged - use of blood glucose monitoring log promoted    Notes:  05/02/20--offered diabetic diet education material, patient declines at this time 07/11/20: Patient states he is motivated to get his diabetes under control. He is following a diabetic diet and his blood sugars have decreased. Patient states he has diabetes education at home.     Problem: Disease Progression (Diabetes, Type 2) Resolved 06/29/2021  Priority: High     Long-Range Goal: Disease Progression Prevented or Minimized Completed 06/29/2021  Start Date: 05/02/2020  Expected End Date: 05/20/2021  Recent Progress: Not on track  Note:   Resolving due to duplicate goal  Evidence-based guidance:  Prepare patient for laboratory and diagnostic exams based on risk and presentation.  Encourage lifestyle changes, such as increased intake of plant-based foods, stress reduction, consistent physical activity and smoking cessation to prevent long-term complications and chronic disease.   Individualize activity and exercise recommendations while considering potential limitations, such as neuropathy, retinopathy or the ability to prevent hyperglycemia or hypoglycemia.   Prepare patient for use of pharmacologic therapy that may include antihypertensive, analgesic, prostaglandin E1 with periodic adjustments, based on presenting chronic condition and laboratory results.  Assess signs/symptoms and risk factors for hypertension, sleep-disordered breathing, neuropathy (including changes in gait and balance), retinopathy, nephropathy and sexual dysfunction.  Address pregnancy planning and contraceptive choice, especially when prescribing antihypertensive or statin.  Ensure completion of annual comprehensive foot exam and dilated eye exam.   Implement additional individualized goals and interventions based on identified risk factors.  Prepare patient for consultation or referral for specialist care, such  as ophthalmology, neurology, cardiology, podiatry, nephrology or perinatology.   Notes:     Task: Monitor and Manage Follow-up for Comorbidities Completed 06/29/2021  Due Date: 05/20/2021  Note:   Care Management Activities:    - completion of annual dilated eye exam confirmed - completion of annual foot exam verified - signs/symptoms of comorbidities identified    Notes:  05/02/20--verbalizes increase in blood sugars related to decrease ability to exercise related to anemia 07/11/20: Patient is up to date with his eye and foot exam. He is aware that  uncontrolled diabetes  effects the eyes, kidneys, heart, and feet.    Care Plan : RN Care Manager Plan of Care  Updates made by Michiel Cowboy, RN since 06/29/2021 12:00 AM     Problem: Knowledge Deficit Related to  Diabetes   Priority: High     Long-Range Goal: Development of Plan of Care for Management of Diabetes   Start Date: 06/29/2021  Expected End Date: 07/20/2021  Priority: High  Note:   Current Barriers:  Chronic Disease Management support and education needs related to DMII   RNCM Clinical Goal(s):  Patient will demonstrate Ongoing adherence to prescribed treatment plan for DMII as evidenced by maintaining A1c <7  through collaboration with RN Care manager, provider, and care team.   Interventions: Inter-disciplinary care team collaboration (see longitudinal plan of care) Evaluation of current treatment plan related to  self management and patient's adherence to plan as established by provider   Diabetes Interventions:  (Status:  Goal on track:  Yes.) Long Term Goal Assessed patient's understanding of A1c goal: <7% Provided education to patient about basic DM disease process Reviewed medications with patient and discussed importance of medication adherence Discussed plans with patient for ongoing care management follow up and provided patient with direct contact information for care management team Provided patient with  written educational materials related to hypo and hyperglycemia and importance of correct treatment Advised patient, providing education and rationale, to check cbg 1-2 and record, calling PCP for findings outside established parameters Assessed social determinant of health barriers Encouraged continuation of a eating a heart healthy diabetic diet Encouraged continuation of staying active by maintaining his home and yard Lab Results  Component Value Date   HGBA1C 6.2 (A) 03/03/2021   HGBA1C 6.2 03/03/2021   HGBA1C 6.2 03/03/2021   HGBA1C 6.2 03/03/2021   Patient Goals/Self-Care Activities: Take all medications as prescribed Attend all scheduled provider appointments keep appointment with eye doctor check blood sugar at prescribed times: twice daily check feet daily for cuts, sores or redness enter blood sugar readings and medication or insulin into daily log take the blood sugar log to all doctor visits set goal weight trim toenails straight across drink 6 to 8 glasses of water each day fill half of plate with vegetables limit fast food meals to no more than 1 per week manage portion size prepare main meal at home 3 to 5 days each week wear comfortable, well-fitting shoes Continue to stay physically active by maintaining your home and yard Continue to limit sugar, carbohydrates, and sodium in your diet  Follow Up Plan:  Telephone follow up appointment with care management team member scheduled for:  March       Plan: Telephone follow up appointment with care management team member scheduled for:  March. Nurse will send PCP today's assessment note.  Emelia Loron RN, [place] [phone] [email or web address]

## 2021-07-08 ENCOUNTER — Encounter: Payer: Self-pay | Admitting: Family

## 2021-07-15 ENCOUNTER — Ambulatory Visit (INDEPENDENT_AMBULATORY_CARE_PROVIDER_SITE_OTHER): Payer: Commercial Managed Care - HMO | Admitting: Family Medicine

## 2021-07-15 ENCOUNTER — Encounter: Payer: Self-pay | Admitting: Family

## 2021-07-15 ENCOUNTER — Encounter: Payer: Self-pay | Admitting: Family Medicine

## 2021-07-15 ENCOUNTER — Other Ambulatory Visit: Payer: Self-pay

## 2021-07-15 VITALS — BP 146/63 | HR 56 | Temp 97.5°F | Ht 68.0 in | Wt 172.0 lb

## 2021-07-15 DIAGNOSIS — Z8673 Personal history of transient ischemic attack (TIA), and cerebral infarction without residual deficits: Secondary | ICD-10-CM | POA: Diagnosis not present

## 2021-07-15 DIAGNOSIS — D500 Iron deficiency anemia secondary to blood loss (chronic): Secondary | ICD-10-CM

## 2021-07-15 DIAGNOSIS — I252 Old myocardial infarction: Secondary | ICD-10-CM

## 2021-07-15 DIAGNOSIS — Z87891 Personal history of nicotine dependence: Secondary | ICD-10-CM

## 2021-07-15 DIAGNOSIS — I10 Essential (primary) hypertension: Secondary | ICD-10-CM

## 2021-07-15 DIAGNOSIS — I4892 Unspecified atrial flutter: Secondary | ICD-10-CM

## 2021-07-15 DIAGNOSIS — I4891 Unspecified atrial fibrillation: Secondary | ICD-10-CM | POA: Diagnosis not present

## 2021-07-15 DIAGNOSIS — I4729 Other ventricular tachycardia: Secondary | ICD-10-CM

## 2021-07-15 DIAGNOSIS — N1831 Chronic kidney disease, stage 3a: Secondary | ICD-10-CM | POA: Diagnosis not present

## 2021-07-15 DIAGNOSIS — E118 Type 2 diabetes mellitus with unspecified complications: Secondary | ICD-10-CM

## 2021-07-15 DIAGNOSIS — I779 Disorder of arteries and arterioles, unspecified: Secondary | ICD-10-CM | POA: Diagnosis not present

## 2021-07-15 DIAGNOSIS — E782 Mixed hyperlipidemia: Secondary | ICD-10-CM | POA: Diagnosis not present

## 2021-07-15 DIAGNOSIS — I259 Chronic ischemic heart disease, unspecified: Secondary | ICD-10-CM

## 2021-07-15 DIAGNOSIS — Z794 Long term (current) use of insulin: Secondary | ICD-10-CM | POA: Diagnosis not present

## 2021-07-15 DIAGNOSIS — Z951 Presence of aortocoronary bypass graft: Secondary | ICD-10-CM

## 2021-07-15 DIAGNOSIS — I739 Peripheral vascular disease, unspecified: Secondary | ICD-10-CM | POA: Diagnosis not present

## 2021-07-15 DIAGNOSIS — E1149 Type 2 diabetes mellitus with other diabetic neurological complication: Secondary | ICD-10-CM | POA: Diagnosis not present

## 2021-07-15 DIAGNOSIS — I2510 Atherosclerotic heart disease of native coronary artery without angina pectoris: Secondary | ICD-10-CM | POA: Diagnosis not present

## 2021-07-15 LAB — POCT GLYCOSYLATED HEMOGLOBIN (HGB A1C)
HbA1c POC (<> result, manual entry): 8.4 % (ref 4.0–5.6)
HbA1c, POC (controlled diabetic range): 8.4 % — AB (ref 0.0–7.0)
HbA1c, POC (prediabetic range): 8.4 % — AB (ref 5.7–6.4)
Hemoglobin A1C: 8.4 % — AB (ref 4.0–5.6)

## 2021-07-15 MED ORDER — INSULIN GLARGINE 100 UNIT/ML ~~LOC~~ SOLN: SUBCUTANEOUS | 5 refills | Status: DC

## 2021-07-15 MED ORDER — BLOOD GLUCOSE MONITOR KIT: PACK | 0 refills | Status: DC

## 2021-07-15 MED ORDER — RYBELSUS 7 MG PO TABS: 1.0000 | ORAL_TABLET | Freq: Every day | ORAL | 1 refills | Status: DC

## 2021-07-15 MED ORDER — "BD INSULIN SYRINGE U/F 31G X 5/16"" 0.5 ML MISC": 4 refills | Status: DC

## 2021-07-15 NOTE — Patient Instructions (Addendum)
°  Great to see you today.  I have refilled the medication(s) we provide.   If labs were collected, we will inform you of lab results once received either by echart message or telephone call.   - echart message- for normal results that have been seen by the patient already.   - telephone call: abnormal results or if patient has not viewed results in their echart. BP goal < 130/80 A1c was 8.4 today- goal < 7%  Increase benicar to 40 mg (can take 1/2 tab in the morning and night) Insulin: increase to 30 units in the morning and stay at 35 units at night. If evening sugars still above 110 after increase, then go to 31 units in the morning, then 32...etc until you see sugars of 110 or lower.

## 2021-07-15 NOTE — Progress Notes (Signed)
This visit occurred during the SARS-CoV-2 public health emergency.  Safety protocols were in place, including screening questions prior to the visit, additional usage of staff PPE, and extensive cleaning of exam room while observing appropriate contact time as indicated for disinfecting solutions.    Patient ID: Marvin Salinas, male  DOB: 21-May-1946, 76 y.o.   MRN: [id number] Patient Care Team    Relationship Specialty Notifications Start End  Ma Hillock, DO PCP - General Family Medicine  01/24/19   [name], Wonda Cheng, MD PCP - Cardiology Cardiology  03/02/21   Chesley Mires, MD Consulting Physician Pulmonary Disease  01/26/19   Constance Haw, MD Consulting Physician Cardiology  01/26/19   Calvert Cantor, MD Consulting Physician Ophthalmology  01/26/19   Garvin Fila, MD Consulting Physician Neurology  01/26/19   Lennon Alstrom, MD Consulting Physician Neurology  01/26/19   [name], Wonda Cheng, MD Consulting Physician Cardiology  04/30/20   Michiel Cowboy, [facility]   06/17/20   Milus Banister, MD Attending Physician Gastroenterology  10/23/20   Celso Amy, NP Nurse Practitioner Nurse Practitioner  10/23/20    Comment: Hematology    Chief Complaint  Patient presents with   Diabetes    [facility]; pt is not fasting    Subjective: Marvin Salinas is a 76 y.o.  male present for  [facility] Type 2 diabetes mellitus with stage 3 chronic kidney disease, with long-term current use of insulin ([place]) Pt reports compliance with Lantus 25 units in the morning and 35 units nightly, Rybelsus 7 mg daily .Marland Kitchen   He reports his fasting blood glucose is approximately 100.  His nighttime blood glucoses are higher around 150-160 he is watching his diet closely and has lost weight.  Atrial fibrillation with RVR (HCC)/Essential hypertension/Ischemic heart disease/ S/P CABG x 3/Paroxysmal atrial flutter (HCC)/PAD (peripheral artery disease) (HCC)/Nonsustained ventricular tachycardia  (HCC)/HLD/NSTEMI/h/o stroke/CKD 3/chronic anticoagulation Patient has a history of severe 3-vessel coronary artery disease with chronic total occlusions of the proximal/mid LAD, mid/distal left circumflex/and proximal RCA.  Moderate caliber D1 and distal RPDA demonstrate 70-80% stenosis.  Moderately elevated left ventricular filling pressure.  Coronary artery stents and CABG have been completed in the past.  He also has a history of cerebral ischemia and TIAs. Pt reports compliance with  Crestor, Eliquis, Benicar 30, metoprolol 12.5 mg twice daily, Zetia, tikosyn. Blood pressures ranges at home 220U-542H systolic.   Patient reports he is feeling good overall.   Pt is prescribed statin. Diet: Heart healthy-low-sodium diet encouraged RF: Hypertension, hyperlipidemia, former smoker, STEMI, stroke, diabetes, family history, CAD, CVD, PAD, bilateral carotid artery stenosis, TIA    Depression screen [facility] 2/9 06/29/2021 04/24/2021 03/18/2021 04/29/2020 02/15/2020  Decreased Interest 0 0 0 0 0  Down, Depressed, Hopeless 1 0 0 0 0  PHQ - 2 Score 1 0 0 0 0  Altered sleeping - - - - -  Tired, decreased energy - - - - -  Change in appetite - - - - -  Feeling bad or failure about yourself  - - - - -  Trouble concentrating - - - - -  Moving slowly or fidgety/restless - - - - -  Suicidal thoughts - - - - -  PHQ-9 Score - - - - -  Difficult doing work/chores - - - - -  Some recent data might be hidden   No flowsheet data found.      Fall Risk  06/29/2021 04/24/2021 03/09/2021 11/06/2020 07/11/2020  Falls in the past year? 0 0 0 0 0  Number falls in past yr: 0 0 0 0 0  Injury with Fall? 0 0 0 0 0  Follow up Falls evaluation completed Falls evaluation completed Falls evaluation completed;Education provided;Falls prevention discussed Falls prevention discussed;Education provided;Falls evaluation completed Falls prevention discussed;Falls evaluation completed;Education provided    Immunization History   Administered Date(s) Administered   Fluad Quad(high Dose 65+) 02/12/2019, 04/29/2020, 04/22/2021   Influenza Split 05/08/2013, 03/28/2017   Influenza, High Dose Seasonal PF 05/08/2013, 03/28/2017, 05/02/2018   Influenza,inj,Quad PF,6+ Mos 04/21/2016   MODERNA COVID-19 SARS-COV-2 PEDS BIVALENT BOOSTER 6Y-11Y 06/18/2020, 12/10/2020   Moderna Sars-Covid-2 Vaccination 08/27/2019, 10/03/2019   Pneumococcal Conjugate-13 07/13/2016   Pneumococcal Polysaccharide-23 02/21/2018   Tdap 02/15/2013   Zoster, Live 06/22/2015    No results found.  Past Medical History:  Diagnosis Date   Acute kidney injury superimposed on chronic kidney disease ([place]) 10/12/2017   Atrial fibrillation with RVR ([place]) 10/27/2017   Back pain    Basilar artery stenosis    on chronic Plavix   Benign prostatic hypertrophy without urinary obstruction 04/17/2014   Carotid artery disease ([place]) 10/06/2010   Carotid US 04/2019: Bilat ICA 40-59; L subclavian stenosis  // Carotid US 11/21: Bilat ICA 40-59; bilateral subclavian stenosis // Carotid US 11/22: Bilateral ICA 40-59; left subclavian stenosis   Cerebral vascular disease    with prior TIA's; followed by Dr. Erling Cruz   Depression, neurotic 11/21/2013   Diabetes mellitus    on insulin   Enthesopathy of ankle and tarsus 09/04/2007   Overview:  Metatarsalgia    Enthesopathy of ankle and tarsus 09/04/2007   Overview:  Metatarsalgia  Formatting of this note might be different from the original. Metatarsalgia  10/1 IMO update   History of renal calculi    Hyperlipidemia    Hypertension    Ischemic heart disease    prior PCI to RCA in 1989. S/P PCI to LAD and OM in 1992. S/P PCI to first DX in 2000. S/P CABG x 3 in May 2011   Left hip pain 01/03/2020   OSA (obstructive sleep apnea) 01/11/2018   AHI 18.1 and SaO2 low 73%   PAD (peripheral artery disease) ([place]) 11/18/2017   ABIs/Arterial US 04/2019: R 1.21; L 0.66 // R SFA 30-49, stable > 50 CIA and EIA stenosis; L > 50 CIA  stenosis (likely represents severe stenosis or short segment occlusion)   Peripheral neuropathy    Pneumonia 02/2011   Sacroiliac joint dysfunction of left side 01/03/2020   Stroke ([place]) 04/16/2001   small right cerebellar infarct on 04/16/2001 at that time he was found to have proximal left vertebral artery, proximal left common carotid artery and both external carotid artery stenosis as well as intracranial stenosis involving mid basilar artery- 07/2011 add questionable TIA   Allergies  Allergen Reactions   Codeine Nausea And Vomiting   Lisinopril Cough   Nsaids Other (See Comments)    CKD   Latex Hives   Past Surgical History:  Procedure Laterality Date    NASAL ENDOSCOPY  01/11/2020   chronic rhinitis w/o evidence of acute sinusitis, bilateral inferior turbinate hypertrophy   ABDOMINAL AORTOGRAM W/LOWER EXTREMITY Bilateral 05/30/2019   Procedure: ABDOMINAL AORTOGRAM W/LOWER EXTREMITY;  Surgeon: Wellington Hampshire, MD;  Location: [facility];  Service: Cardiovascular;  Laterality: Bilateral;   ANGIOPLASTY  1989   right coronary artery   ANGIOPLASTY  1992  LAD and OM   ANGIOPLASTY  1998   First DX   BRAIN SURGERY     on prior records   CORONARY ARTERY BYPASS GRAFT  11/12/2009   LIMA to LAD, SVG to OM and SVG to RCA   CORONARY STENT PLACEMENT  2000   Stent to LAD/Circumflex with angioplasty to first diagonal    LEFT HEART CATH AND CORS/GRAFTS ANGIOGRAPHY N/A 10/13/2017   Procedure: LEFT HEART CATH AND CORS/GRAFTS ANGIOGRAPHY;  Surgeon: Nelva Bush, MD;  Location: [facility];  Service: Cardiovascular;  Laterality: N/A;   Family History  Problem Relation Age of Onset   Depression Mother    Early death Mother    Kidney disease Mother    Ovarian cancer Mother    Hypertension Father    Heart disease Father    Heart attack Father    Asthma Brother    Diabetes Brother    Stroke Other        Uncle   Diabetes Sister    Colon cancer Neg Hx    Rectal cancer  Neg Hx    Stomach cancer Neg Hx    Esophageal cancer Neg Hx    Social History   Social History Narrative   Marital status/children/pets: divorced.   Education/employment: retired, Patient has his GED. 9th grade education.    Safety:      -smoke alarm in the home:Yes     - wears seatbelt: Yes   Patient is right handed.   Patient does not drink any caffeine.    Allergies as of 07/15/2021       Reactions   Codeine Nausea And Vomiting   Lisinopril Cough   Nsaids Other (See Comments)   CKD   Latex Hives        Medication List        Accurate as of July 15, 2021  4:52 PM. If you have any questions, ask your nurse or doctor.          ascorbic acid 500 MG tablet Commonly known as: VITAMIN C Take by mouth.   BD Insulin Syringe U/F 31G X 5/16" 0.5 ML Misc Generic drug: Insulin Syringe-Needle U-100 E.11.9- monitor blood glucose tid for IDDM What changed: See the new instructions. Changed by: Howard Pouch, DO   blood glucose meter kit and supplies Kit Dispense based on patient and insurance preference. Use up to four times daily as directed.   cholecalciferol 25 MCG (1000 UNIT) tablet Commonly known as: VITAMIN D3 Take 1,000 Units by mouth daily.   co-enzyme Q-10 30 MG capsule Take 30 mg by mouth daily.   dofetilide 125 MCG capsule Commonly known as: TIKOSYN TAKE 1 CAPSULE BY MOUTH TWICE A DAY   Eliquis 5 MG Tabs tablet Generic drug: apixaban TAKE 1 TABLET BY MOUTH TWICE A DAY   ezetimibe 10 MG tablet Commonly known as: ZETIA Take 1 tablet (10 mg total) by mouth daily.   ferrous sulfate 325 (65 FE) MG tablet Take 1 tablet (325 mg total) by mouth every other day.   fluticasone 50 MCG/ACT nasal spray Commonly known as: FLONASE Place 2 sprays into both nostrils daily.   GENTEAL OP Place 1 drop into both eyes daily as needed (dry eyes).   insulin glargine 100 UNIT/ML injection Commonly known as: LANTUS 30-35 units subcu injection in the morning and  35 units subcu injection before bed. What changed: additional instructions Changed by: Howard Pouch, DO   loratadine 10 MG tablet Commonly known  as: CLARITIN Take 10 mg by mouth daily as needed for allergies.   Magnesium 250 MG Tabs Take by mouth.   metoprolol tartrate 25 MG tablet Commonly known as: LOPRESSOR Take 0.5 tablets (12.5 mg total) by mouth 2 (two) times daily.   nitroGLYCERIN 0.4 MG SL tablet Commonly known as: NITROSTAT PLACE 1 TABLET (0.4 MG TOTAL) UNDER THE TONGUE EVERY 5 (FIVE) MINUTES AS NEEDED FOR CHEST PAIN.   olmesartan 40 MG tablet Commonly known as: BENICAR Take 40 mg by mouth daily. What changed: Another medication with the same name was removed. Continue taking this medication, and follow the directions you see here. Changed by: Howard Pouch, DO   OneTouch Delica Plus [id number] Misc ONETOUCH VERIO LANCETS USE TO TEST YOUR BLOOD SUGAR TWICE A DAY FINGER STICK   OneTouch Verio test strip Generic drug: glucose blood USE UP TO 4 TIMES DAILY AS DIRECTED   polyethylene glycol powder 17 GM/SCOOP powder Commonly known as: GLYCOLAX/MIRALAX Take by mouth.   rosuvastatin 20 MG tablet Commonly known as: CRESTOR Take 1 tablet (20 mg total) by mouth at bedtime.   Rybelsus 7 MG Tabs Generic drug: Semaglutide Take 1 tablet by mouth daily with breakfast.        All past medical history, surgical history, allergies, family history, immunizations andmedications were updated in the EMR today and reviewed under the history and medication portions of their EMR.      ROS: 14 pt review of systems performed and negative (unless mentioned in an HPI)  Objective: BP (!) 146/63    Pulse (!) 56    Temp (!) 97.5 F (36.4 C) (Oral)    Ht '5\' 8"'  (1.727 m)    Wt 172 lb (78 kg)    SpO2 99%    BMI 26.15 kg/m  Physical Exam Vitals and nursing note reviewed.  Constitutional:      General: He is not in acute distress.    Appearance: Normal appearance. He is not  ill-appearing, toxic-appearing or diaphoretic.  HENT:     Head: Normocephalic and atraumatic.     Mouth/Throat:     Mouth: Mucous membranes are moist.  Eyes:     General: No scleral icterus.       Right eye: No discharge.        Left eye: No discharge.     Extraocular Movements: Extraocular movements intact.     Pupils: Pupils are equal, round, and reactive to light.  Cardiovascular:     Rate and Rhythm: Normal rate and regular rhythm.  Pulmonary:     Effort: Pulmonary effort is normal. No respiratory distress.     Breath sounds: Normal breath sounds. No wheezing, rhonchi or rales.  Musculoskeletal:     Cervical back: Neck supple.  Lymphadenopathy:     Cervical: No cervical adenopathy.  Skin:    General: Skin is warm and dry.     Coloration: Skin is not jaundiced or pale.     Findings: No rash.  Neurological:     Mental Status: He is alert and oriented to person, place, and time. Mental status is at baseline.  Psychiatric:        Mood and Affect: Mood normal.        Behavior: Behavior normal.        Thought Content: Thought content normal.        Judgment: Judgment normal.    Results for orders placed or performed in visit on 07/15/21 (from the past 24 hour(s))  POCT HgB A1C     Status: Abnormal   Collection Time: 07/15/21  2:49 PM  Result Value Ref Range   Hemoglobin A1C 8.4 (A) 4.0 - 5.6 %   HbA1c POC (<> result, manual entry) 8.4 4.0 - 5.6 %   HbA1c, POC (prediabetic range) 8.4 (A) 5.7 - 6.4 %   HbA1c, POC (controlled diabetic range) 8.4 (A) 0.0 - 7.0 %     Assessment/plan: Marvin Salinas is a 76 y.o. male present for est care Type 2 diabetes mellitus with stage 3 chronic kidney disease, with long-term current use of insulin ([place]) -Patient was encouraged to follow a diabetic diet and make an effort to get routine exercise.  He is doing really great with his weight loss. -Increase Lantus to 25>30-35 units a.m. and 35 units in the night.   - Given his cardiac history  and his CKD oral options are limited-insurance does not cover Jardiance either. -DISCONTINUED last visit glipizide 10/10 mg twice daily before meals today. -Continue Rybelsus 7 mg daily-hesitant to increase secondary to side effects with increasing Ozempic prior. - Medicines tried: Ozempic (side effects) - He declined nutrition referral PNA series: Pneumonia series completed Flu shot: completed11/2022 (recommneded yearly).   Foot exam: Referred to podiatry 12/2018-completed today Eye exam: 03/2020, Dr. Gwenevere Ghazi changes noted completed this year.  Records requested A1c: 8.9>>> 13.7 (08/22/2018)>>11 (01/24/2019)>>10.8 08/20/2019> 10.5>10.3> 6.4 > 6.2 > 8.4 today!!!!    Atrial fibrillation with RVR (HCC)/Essential hypertension/Ischemic heart disease/ S/P CABG x 3/Paroxysmal atrial flutter (HCC)/PAD (peripheral artery disease) (HCC)/Nonsustained ventricular tachycardia (HCC)/HLD/NSTEMI/h/o stroke/PAD -Above goal. - Patient aware goal is less than 130/80.   - Prescribed by cardio: tikosyn, Eliquis (recently changed from xarelto) - continue  Crestor 20 mg daily  -continue zeita. -Increase Benicar to 40 mg daily.  (Or 20 mg twice daily his choice) he has a prescription from nephrology that he will use-he had not started it as of yet and had continued the 30 mg.  I do believe he needs a higher dose now. - continue metoprolol 12.5 mg twice daily  - He has stopped smoking !!!!   Chronic kidney disease (CKD), stage III (moderate) (HCC) Renally dose meds. Avoid NSAIDs. PTH/calcium/vitamin D UTD 10/2020 Referred to nephrology 2022.  He also is now referred to hematology/oncology for abnormal SPEP/upep   Return in about 15 weeks (around 10/28/2021) for [facility] (30 min).  Orders Placed This Encounter  Procedures   POCT HgB A1C    Meds ordered this encounter  Medications   BD INSULIN SYRINGE U/F 31G X 5/16" 0.5 ML MISC    Sig: E.11.9- monitor blood glucose tid for IDDM    Dispense:  100 each     Refill:  4   Semaglutide (RYBELSUS) 7 MG TABS    Sig: Take 1 tablet by mouth daily with breakfast.    Dispense:  90 tablet    Refill:  1   blood glucose meter kit and supplies KIT    Sig: Dispense based on patient and insurance preference. Use up to four times daily as directed.    Dispense:  1 each    Refill:  0    Per formulary.    Order Specific Question:   Number of strips    Answer:   120    Order Specific Question:   Number of lancets    Answer:   120   insulin glargine (LANTUS) 100 UNIT/ML injection    Sig: 30-35 units subcu injection in the  morning and 35 units subcu injection before bed.    Dispense:  30 mL    Refill:  5    DC ALL other lantus scripts please.    Referral Orders  No referral(s) requested today     Note is dictated utilizing voice recognition software. Although note has been proof read prior to signing, occasional typographical errors still can be missed. If any questions arise, please do not hesitate to call for verification.  Electronically signed by: Howard Pouch, DO [place]

## 2021-07-16 ENCOUNTER — Telehealth: Payer: Self-pay | Admitting: Cardiovascular Disease

## 2021-07-16 NOTE — Telephone Encounter (Signed)
Patient c/o Palpitations:  High priority if patient c/o lightheadedness, shortness of breath, or chest pain  How long have you had palpitations/irregular HR/ Afib? Are you having the symptoms now? Saturday - Monday, no  Are you currently experiencing lightheadedness, SOB or CP? no  Do you have a history of afib (atrial fibrillation) or irregular heart rhythm? yes  Have you checked your BP or HR? (document readings if available): yes, 140/70   Are you experiencing any other symptoms? no

## 2021-07-16 NOTE — Telephone Encounter (Signed)
Called the pt and offered him an appt with the DOD.. Dr. Quentin Ore 07/17/21 and he agreed to the appt.

## 2021-07-16 NOTE — Telephone Encounter (Signed)
Called the pt back and he reports that he has felt his heart was out of rhythm last weekend for about 4 days... he says he did not fel well with the his palpitations... he had som,e dizziness, blurred vision and last Saturday he got lightheaded and fell but did not lose consciousness and did not injure himself.    He saw his PCP yesterday but did not tell her about it since he si now feeling better and he felt she would say "call Dr. Mariane Duval"... So he did not have an EKG while at her office.   I am unable to make an appt before the end of the week for the pt... I will forward to Dr. Acie Fredrickson for his review but until then pt advised that if anything worsens he should not drive and call EMS and he agrees.   Hi BP today 140/70 nut he does not know his HR and I went over his meds with him and he says he is taking all of his meds as listed.

## 2021-07-17 ENCOUNTER — Ambulatory Visit (INDEPENDENT_AMBULATORY_CARE_PROVIDER_SITE_OTHER): Payer: Commercial Managed Care - HMO

## 2021-07-17 ENCOUNTER — Encounter: Payer: Self-pay | Admitting: Cardiology

## 2021-07-17 ENCOUNTER — Other Ambulatory Visit: Payer: Self-pay

## 2021-07-17 ENCOUNTER — Ambulatory Visit: Payer: Commercial Managed Care - HMO | Admitting: Cardiology

## 2021-07-17 VITALS — BP 130/78 | HR 57 | Ht 68.0 in | Wt 172.6 lb

## 2021-07-17 DIAGNOSIS — R519 Headache, unspecified: Secondary | ICD-10-CM | POA: Diagnosis not present

## 2021-07-17 DIAGNOSIS — R55 Syncope and collapse: Secondary | ICD-10-CM | POA: Diagnosis not present

## 2021-07-17 DIAGNOSIS — I4891 Unspecified atrial fibrillation: Secondary | ICD-10-CM | POA: Diagnosis not present

## 2021-07-17 DIAGNOSIS — H539 Unspecified visual disturbance: Secondary | ICD-10-CM | POA: Diagnosis not present

## 2021-07-17 NOTE — Progress Notes (Unsigned)
Enrolled patient for a 14 day Zio XT monitor to be mailed to patients home  Dr [name] to read 

## 2021-07-17 NOTE — Progress Notes (Signed)
Electrophysiology Office Note:    Date:  07/17/2021   ID:  Marvin Salinas, DOB 1946-03-03, MRN [id number]  PCP:  Ma Hillock, DO  [place] Cardiologist:  Mertie Moores, MD  [place] Electrophysiologist:  None   Referring MD: Ma Hillock, DO   Chief Complaint: Headache and visual changes  History of Present Illness:    Marvin Salinas is a 76 y.o. male who presents for an evaluation of headache and visual changes.  He was added on by Dr. Acie Fredrickson as a doctor the day patient.  The patient tells me that he has had several episodes of visual changes and headache that last 30 seconds to a minute.  The most recent episode was 2 days ago.  He has a history of atrial fibrillation.  He takes Eliquis and Tikosyn.  He also thinks his episodes of atrial fibrillation have been increasing in frequency recently.  It seems that Dr. Acie Fredrickson manages his Tikosyn.    Past Medical History:  Diagnosis Date   Acute kidney injury superimposed on chronic kidney disease ([place]) 10/12/2017   Atrial fibrillation with RVR ([place]) 10/27/2017   Back pain    Basilar artery stenosis    on chronic Plavix   Benign prostatic hypertrophy without urinary obstruction 04/17/2014   Carotid artery disease ([place]) 10/06/2010   Carotid US 04/2019: Bilat ICA 40-59; L subclavian stenosis  // Carotid US 11/21: Bilat ICA 40-59; bilateral subclavian stenosis // Carotid US 11/22: Bilateral ICA 40-59; left subclavian stenosis   Cerebral vascular disease    with prior TIA's; followed by Dr. Erling Cruz   Depression, neurotic 11/21/2013   Diabetes mellitus    on insulin   Enthesopathy of ankle and tarsus 09/04/2007   Overview:  Metatarsalgia    Enthesopathy of ankle and tarsus 09/04/2007   Overview:  Metatarsalgia  Formatting of this note might be different from the original. Metatarsalgia  10/1 IMO update   History of renal calculi    Hyperlipidemia    Hypertension    Ischemic heart disease    prior PCI to RCA in 1989. S/P  PCI to LAD and OM in 1992. S/P PCI to first DX in 2000. S/P CABG x 3 in May 2011   Left hip pain 01/03/2020   OSA (obstructive sleep apnea) 01/11/2018   AHI 18.1 and SaO2 low 73%   PAD (peripheral artery disease) ([place]) 11/18/2017   ABIs/Arterial US 04/2019: R 1.21; L 0.66 // R SFA 30-49, stable > 50 CIA and EIA stenosis; L > 50 CIA stenosis (likely represents severe stenosis or short segment occlusion)   Peripheral neuropathy    Pneumonia 02/2011   Sacroiliac joint dysfunction of left side 01/03/2020   Stroke ([place]) 04/16/2001   small right cerebellar infarct on 04/16/2001 at that time he was found to have proximal left vertebral artery, proximal left common carotid artery and both external carotid artery stenosis as well as intracranial stenosis involving mid basilar artery- 07/2011 add questionable TIA    Past Surgical History:  Procedure Laterality Date    NASAL ENDOSCOPY  01/11/2020   chronic rhinitis w/o evidence of acute sinusitis, bilateral inferior turbinate hypertrophy   ABDOMINAL AORTOGRAM W/LOWER EXTREMITY Bilateral 05/30/2019   Procedure: ABDOMINAL AORTOGRAM W/LOWER EXTREMITY;  Surgeon: Wellington Hampshire, MD;  Location: [facility];  Service: Cardiovascular;  Laterality: Bilateral;   ANGIOPLASTY  1989   right coronary artery   ANGIOPLASTY  1992   LAD and OM   ANGIOPLASTY  1998   First DX   BRAIN SURGERY     on prior records   CORONARY ARTERY BYPASS GRAFT  11/12/2009   LIMA to LAD, SVG to OM and SVG to RCA   CORONARY STENT PLACEMENT  2000   Stent to LAD/Circumflex with angioplasty to first diagonal    LEFT HEART CATH AND CORS/GRAFTS ANGIOGRAPHY N/A 10/13/2017   Procedure: LEFT HEART CATH AND CORS/GRAFTS ANGIOGRAPHY;  Surgeon: Nelva Bush, MD;  Location: [facility];  Service: Cardiovascular;  Laterality: N/A;    Current Medications: Current Meds  Medication Sig   ascorbic acid (VITAMIN C) 500 MG tablet Take by mouth.   BD INSULIN SYRINGE U/F 31G X  5/16" 0.5 ML MISC E.11.9- monitor blood glucose tid for IDDM   blood glucose meter kit and supplies KIT Dispense based on patient and insurance preference. Use up to four times daily as directed.   Carboxymethylcell-Hypromellose (GENTEAL OP) Place 1 drop into both eyes daily as needed (dry eyes).   cholecalciferol (VITAMIN D3) 25 MCG (1000 UNIT) tablet Take 1,000 Units by mouth daily.   co-enzyme Q-10 30 MG capsule Take 30 mg by mouth daily.   dofetilide (TIKOSYN) 125 MCG capsule TAKE 1 CAPSULE BY MOUTH TWICE A DAY   ELIQUIS 5 MG TABS tablet TAKE 1 TABLET BY MOUTH TWICE A DAY   ezetimibe (ZETIA) 10 MG tablet Take 1 tablet (10 mg total) by mouth daily.   ferrous sulfate 325 (65 FE) MG tablet Take 1 tablet (325 mg total) by mouth every other day.   fluticasone (FLONASE) 50 MCG/ACT nasal spray Place 2 sprays into both nostrils daily.   insulin glargine (LANTUS) 100 UNIT/ML injection 30-35 units subcu injection in the morning and 35 units subcu injection before bed.   Lancets (ONETOUCH DELICA PLUS [id number]) MISC ONETOUCH VERIO LANCETS USE TO TEST YOUR BLOOD SUGAR TWICE A DAY FINGER STICK   loratadine (CLARITIN) 10 MG tablet Take 10 mg by mouth daily as needed for allergies.   Magnesium 250 MG TABS Take by mouth.   metoprolol tartrate (LOPRESSOR) 25 MG tablet Take 0.5 tablets (12.5 mg total) by mouth 2 (two) times daily.   nitroGLYCERIN (NITROSTAT) 0.4 MG SL tablet PLACE 1 TABLET (0.4 MG TOTAL) UNDER THE TONGUE EVERY 5 (FIVE) MINUTES AS NEEDED FOR CHEST PAIN.   olmesartan (BENICAR) 40 MG tablet Take 40 mg by mouth daily.   ONETOUCH VERIO test strip USE UP TO 4 TIMES DAILY AS DIRECTED   polyethylene glycol powder (GLYCOLAX/MIRALAX) 17 GM/SCOOP powder Take by mouth.   rosuvastatin (CRESTOR) 20 MG tablet Take 1 tablet (20 mg total) by mouth at bedtime.   Semaglutide (RYBELSUS) 7 MG TABS Take 1 tablet by mouth daily with breakfast.     Allergies:   Codeine, Lisinopril, Nsaids, and Latex   Social  History   Socioeconomic History   Marital status: Divorced    Spouse name: Not on file   Number of children: 0   Years of education: GED   Highest education level: Not on file  Occupational History   Occupation: retired  Tobacco Use   Smoking status: Former    Packs/day: 0.25    Years: 54.00    Pack years: 13.50    Types: Cigarettes    Quit date: 2022    Years since quitting: 1.0   Smokeless tobacco: Never  Vaping Use   Vaping Use: Never used  Substance and Sexual Activity   Alcohol use: Yes   Drug use:  No   Sexual activity: Not Currently    Partners: Female  Other Topics Concern   Not on file  Social History Narrative   Marital status/children/pets: divorced.   Education/employment: retired, Patient has his GED. 9th grade education.    Safety:      -smoke alarm in the home:Yes     - wears seatbelt: Yes   Patient is right handed.   Patient does not drink any caffeine.   Social Determinants of Health   Financial Resource Strain: Low Risk    Difficulty of Paying Living Expenses: Not very hard  Food Insecurity: No Food Insecurity   Worried About Charity fundraiser in the Last Year: Never true   Ran Out of Food in the Last Year: Never true  Transportation Needs: No Transportation Needs   Lack of Transportation (Medical): No   Lack of Transportation (Non-Medical): No  Physical Activity: Insufficiently Active   Days of Exercise per Week: 4 days   Minutes of Exercise per Session: 10 min  Stress: No Stress Concern Present   Feeling of Stress : Not at all  Social Connections: Socially Isolated   Frequency of Communication with Friends and Family: Twice a week   Frequency of Social Gatherings with Friends and Family: Twice a week   Attends Religious Services: Never   Marine scientist or Organizations: No   Attends Music therapist: Never   Marital Status: Divorced     Family History: The patient's family history includes Asthma in his brother;  Depression in his mother; Diabetes in his brother and sister; Early death in his mother; Heart attack in his father; Heart disease in his father; Hypertension in his father; Kidney disease in his mother; Ovarian cancer in his mother; Stroke in an other family member. There is no history of Colon cancer, Rectal cancer, Stomach cancer, or Esophageal cancer.  ROS:   Please see the history of present illness.    All other systems reviewed and are negative.  EKGs/Labs/Other Studies Reviewed:    The following studies were reviewed today:    EKG:  The ekg ordered today demonstrates sinus rhythm.  QTC is 450 ms.   Recent Labs: 10/23/2020: TSH 0.73 02/04/2021: ALT 20; BUN 21; Creatinine 1.35; Hemoglobin 15.4; Platelet Count 231; Potassium 4.6; Sodium 139  Recent Lipid Panel    Component Value Date/Time   CHOL 137 10/23/2020 1112   CHOL 110 10/01/2019 0944   TRIG 125.0 10/23/2020 1112   HDL 44.30 10/23/2020 1112   HDL 42 10/01/2019 0944   CHOLHDL 3 10/23/2020 1112   VLDL 25.0 10/23/2020 1112   LDLCALC 68 10/23/2020 1112   LDLCALC 36 10/01/2019 0944    Physical Exam:    VS:  BP 130/78    Pulse (!) 57    Ht '5\' 8"'  (1.727 m)    Wt 172 lb 9.6 oz (78.3 kg)    SpO2 96%    BMI 26.24 kg/m     Wt Readings from Last 3 Encounters:  07/17/21 172 lb 9.6 oz (78.3 kg)  07/15/21 172 lb (78 kg)  05/05/21 169 lb 12.8 oz (77 kg)     GEN:  Well nourished, well developed in no acute distress.  Elderly HEENT: Normal NECK: No JVD; No carotid bruits LYMPHATICS: No lymphadenopathy CARDIAC: RRR, no murmurs, rubs, gallops RESPIRATORY:  Clear to auscultation without rales, wheezing or rhonchi  ABDOMEN: Soft, non-tender, non-distended MUSCULOSKELETAL:  No edema; No deformity  SKIN: Warm and dry  NEUROLOGIC:  Alert and oriented x 3 PSYCHIATRIC:  Normal affect       ASSESSMENT:    1. Atrial fibrillation, unspecified type ([place])   2. Pre-syncope   3. Visual disturbance   4. Nonintractable headache,  unspecified chronicity pattern, unspecified headache type    PLAN:    In order of problems listed above:  #Atrial fibrillation In sinus rhythm today on dofetilide.  On Eliquis for stroke prophylaxis.  QTC acceptable. Patient describes increasing burden of atrial fibrillation.  We will repeat a ZIO monitor to see if his A. fib burden is increased significantly.  #Visual disturbances and headache. Unclear what these represent.  Given his history of atrial fibrillation I am concerned that these could represent TIA like episodes.  I will put in a referral to neurology to see if they would like any additional imaging or work-up.  We discussed symptoms that should prompt an emergent visit to the emergency department/calling 911.  He expressed understanding.  Follow-up with Dr. Acie Fredrickson in 6 to 8 weeks to review results of ZIO monitor.   Medication Adjustments/Labs and Tests Ordered: Current medicines are reviewed at length with the patient today.  Concerns regarding medicines are outlined above.  Orders Placed This Encounter  Procedures   Ambulatory referral to Neurology   LONG TERM MONITOR (3-14 DAYS)   No orders of the defined types were placed in this encounter.    Signed, Hilton Cork. Quentin Ore, MD, [facility], [facility] 07/17/2021 9:16 PM    Electrophysiology [facility]

## 2021-07-17 NOTE — Patient Instructions (Addendum)
Medication Instructions:  Your physician recommends that you continue on your current medications as directed. Please refer to the Current Medication list given to you today. *If you need a refill on your cardiac medications before your next appointment, please call your pharmacy*  Lab Work: None. If you have labs (blood work) drawn today and your tests are completely normal, you will receive your results only by: Marvin Salinas (if you have MyChart) OR A paper copy in the mail If you have any lab test that is abnormal or we need to change your treatment, we will call you to review the results.  Testing/Procedures: Your physician has recommended that you wear a heart monitor. Heart monitors are medical devices that record the hearts electrical activity. Doctors most often use these monitors to diagnose arrhythmias. Arrhythmias are problems with the speed or rhythm of the heartbeat. The monitor is a small, portable device. You can wear one while you do your normal daily activities. This is usually used to diagnose what is causing palpitations/syncope (passing out).   Follow-Up: At [facility], you and your health needs are our priority.  As part of our continuing mission to provide you with exceptional heart care, we have created designated Provider Care Teams.  These Care Teams include your primary Cardiologist (physician) and Advanced Practice Providers (APPs -  Physician Assistants and Nurse Practitioners) who all work together to provide you with the care you need, when you need it.  Your physician wants you to follow-up in: 6-8 weeks with Dr. Cathie Olden.   We recommend signing up for the patient portal called "MyChart".  Sign up information is provided on this After Visit Summary.  MyChart is used to connect with patients for Virtual Visits (Telemedicine).  Patients are able to view lab/test results, encounter notes, upcoming appointments, etc.  Non-urgent messages can be sent to your  provider as well.   To learn more about what you can do with MyChart, go to [email or web address].    Any Other Special Instructions Will Be Listed Below (If Applicable).  ZIO XT- Long Term Monitor Instructions  Your physician has requested you wear a ZIO patch monitor for 14 days.  This is a single patch monitor. [employer] supplies one patch monitor per enrollment. Additional stickers are not available. Please do not apply patch if you will be having a Nuclear Stress Test,  Echocardiogram, Cardiac CT, MRI, or Chest Xray during the period you would be wearing the  monitor. The patch cannot be worn during these tests. You cannot remove and re-apply the  ZIO XT patch monitor.  Your ZIO patch monitor will be mailed 3 day USPS to your address on file. It may take 3-5 days  to receive your monitor after you have been enrolled.  Once you have received your monitor, please review the enclosed instructions. Your monitor  has already been registered assigning a specific monitor serial # to you.  Billing and Patient Assistance Program Information  We have supplied [employer] with any of your insurance information on file for billing purposes. [employer] offers a sliding scale Patient Assistance Program for patients that do not have  insurance, or whose insurance does not completely cover the cost of the ZIO monitor.  You must apply for the Patient Assistance Program to qualify for this discounted rate.  To apply, please call [employer] at [phone], select option 4, select option 2, ask to apply for  Patient Assistance Program. Marvin Salinas will ask your household income, and how many people  are in your household. They will quote your out-of-pocket cost based on that information.  [employer] will also be able to set up a 73-month, interest-free payment plan if needed.  Applying the monitor   Shave hair from upper left chest.  Hold abrader disc by orange tab. Rub abrader in 40 strokes over the upper left  chest as  indicated in your monitor instructions.  Clean area with 4 enclosed alcohol pads. Let dry.  Apply patch as indicated in monitor instructions. Patch will be placed under collarbone on left  side of chest with arrow pointing upward.  Rub patch adhesive wings for 2 minutes. Remove white label marked "1". Remove the white  label marked "2". Rub patch adhesive wings for 2 additional minutes.  While looking in a mirror, press and release button in center of patch. A small green light will  flash 3-4 times. This will be your only indicator that the monitor has been turned on.  Do not shower for the first 24 hours. You may shower after the first 24 hours.  Press the button if you feel a symptom. You will hear a small click. Record Date, Time and  Symptom in the Patient Logbook.  When you are ready to remove the patch, follow instructions on the last 2 pages of Patient  Logbook. Stick patch monitor onto the last page of Patient Logbook.  Place Patient Logbook in the blue and white box. Use locking tab on box and tape box closed  securely. The blue and white box has prepaid postage on it. Please place it in the mailbox as  soon as possible. Your physician should have your test results approximately 7 days after the  monitor has been mailed back to [facility].  Call [employer] at [phone] if you have questions regarding  your ZIO XT patch monitor. Call them immediately if you see an orange light blinking on your  monitor.  If your monitor falls off in less than 4 days, contact our Monitor department at [phone].  If your monitor becomes loose or falls off after 4 days call [employer] at [phone] for  suggestions on securing your monitor

## 2021-07-21 ENCOUNTER — Telehealth: Payer: Self-pay

## 2021-07-21 NOTE — Telephone Encounter (Signed)
Pt informed to call pharmacy.

## 2021-07-21 NOTE — Telephone Encounter (Signed)
Patient refill request.  [facility]  ezetimibe (ZETIA) 10 MG tablet [[id number]]

## 2021-07-22 DIAGNOSIS — I4891 Unspecified atrial fibrillation: Secondary | ICD-10-CM

## 2021-07-22 DIAGNOSIS — R55 Syncope and collapse: Secondary | ICD-10-CM | POA: Diagnosis not present

## 2021-07-28 NOTE — Addendum Note (Signed)
Addended by: Janan Ridge on: 07/28/2021 02:06 PM   Modules accepted: Orders

## 2021-08-03 ENCOUNTER — Encounter: Payer: Self-pay | Admitting: Neurology

## 2021-08-04 ENCOUNTER — Encounter: Payer: Self-pay | Admitting: Family

## 2021-08-07 ENCOUNTER — Other Ambulatory Visit: Payer: Medicare Other

## 2021-08-07 ENCOUNTER — Ambulatory Visit: Payer: Medicare Other | Admitting: Family

## 2021-08-11 ENCOUNTER — Encounter: Payer: Self-pay | Admitting: Family

## 2021-08-11 ENCOUNTER — Inpatient Hospital Stay (HOSPITAL_BASED_OUTPATIENT_CLINIC_OR_DEPARTMENT_OTHER): Payer: Medicare Other | Admitting: Family

## 2021-08-11 ENCOUNTER — Inpatient Hospital Stay: Payer: Medicare Other | Attending: Family

## 2021-08-11 ENCOUNTER — Other Ambulatory Visit: Payer: Self-pay

## 2021-08-11 VITALS — BP 145/53 | HR 61 | Temp 98.0°F | Resp 18 | Wt 169.4 lb

## 2021-08-11 DIAGNOSIS — R778 Other specified abnormalities of plasma proteins: Secondary | ICD-10-CM

## 2021-08-11 DIAGNOSIS — D500 Iron deficiency anemia secondary to blood loss (chronic): Secondary | ICD-10-CM | POA: Diagnosis not present

## 2021-08-11 DIAGNOSIS — I4891 Unspecified atrial fibrillation: Secondary | ICD-10-CM | POA: Diagnosis not present

## 2021-08-11 DIAGNOSIS — Z79899 Other long term (current) drug therapy: Secondary | ICD-10-CM | POA: Diagnosis not present

## 2021-08-11 DIAGNOSIS — Z7901 Long term (current) use of anticoagulants: Secondary | ICD-10-CM | POA: Insufficient documentation

## 2021-08-11 DIAGNOSIS — R55 Syncope and collapse: Secondary | ICD-10-CM | POA: Diagnosis not present

## 2021-08-11 DIAGNOSIS — K922 Gastrointestinal hemorrhage, unspecified: Secondary | ICD-10-CM | POA: Diagnosis not present

## 2021-08-11 LAB — CBC WITH DIFFERENTIAL (CANCER CENTER ONLY)
Abs Immature Granulocytes: 0.03 10*3/uL (ref 0.00–0.07)
Basophils Absolute: 0.1 10*3/uL (ref 0.0–0.1)
Basophils Relative: 1 %
Eosinophils Absolute: 0.3 10*3/uL (ref 0.0–0.5)
Eosinophils Relative: 3 %
HCT: 46.5 % (ref 39.0–52.0)
Hemoglobin: 16.1 g/dL (ref 13.0–17.0)
Immature Granulocytes: 0 %
Lymphocytes Relative: 18 %
Lymphs Abs: 1.8 10*3/uL (ref 0.7–4.0)
MCH: 31 pg (ref 26.0–34.0)
MCHC: 34.6 g/dL (ref 30.0–36.0)
MCV: 89.4 fL (ref 80.0–100.0)
Monocytes Absolute: 0.8 10*3/uL (ref 0.1–1.0)
Monocytes Relative: 8 %
Neutro Abs: 7 10*3/uL (ref 1.7–7.7)
Neutrophils Relative %: 70 %
Platelet Count: 261 10*3/uL (ref 150–400)
RBC: 5.2 MIL/uL (ref 4.22–5.81)
RDW: 12.8 % (ref 11.5–15.5)
WBC Count: 10 10*3/uL (ref 4.0–10.5)
nRBC: 0 % (ref 0.0–0.2)

## 2021-08-11 LAB — CMP (CANCER CENTER ONLY)
ALT: 22 U/L (ref 0–44)
AST: 22 U/L (ref 15–41)
Albumin: 4.1 g/dL (ref 3.5–5.0)
Alkaline Phosphatase: 60 U/L (ref 38–126)
Anion gap: 7 (ref 5–15)
BUN: 30 mg/dL — ABNORMAL HIGH (ref 8–23)
CO2: 27 mmol/L (ref 22–32)
Calcium: 9.1 mg/dL (ref 8.9–10.3)
Chloride: 108 mmol/L (ref 98–111)
Creatinine: 1.69 mg/dL — ABNORMAL HIGH (ref 0.61–1.24)
GFR, Estimated: 42 mL/min — ABNORMAL LOW (ref >60)
Glucose, Bld: 97 mg/dL (ref 70–99)
Potassium: 4.7 mmol/L (ref 3.5–5.1)
Sodium: 142 mmol/L (ref 135–145)
Total Bilirubin: 0.5 mg/dL (ref 0.3–1.2)
Total Protein: 6.5 g/dL (ref 6.5–8.1)

## 2021-08-11 NOTE — Progress Notes (Signed)
Hematology and Oncology Follow Up Visit  Marvin Salinas [id number] 03-Apr-1946 76 y.o. 08/11/2021   Principle Diagnosis:  Iron deficiency anemia secondary to intermittent GI blood loss   Current Therapy:        IV iron as indicated    Interim History:  Marvin Salinas is here today for follow-up. We did protein studies on him today and PCP is getting a 24 hour urine on him as well to reassess light chains.  He states that since starting Rybelsus he has had dizziness, nausea and abdominal discomfort soon after taking.  No falls or syncope to report.  No fever, chills, vomiting, cough, rash, SOB, chest pain, palpitations, abdominal pain or changes in bowel or bladder habits.  No blood loss noted. No bruising or petechiae.  No swelling, tenderness in his extremities at this time.  He has tingling in the ring and pinky fingers of his left hand and left toes.  He has maintained a good appetite and is staying well hydrated. His weight is stable at 169 lbs.   ECOG Performance Status: 1 - Symptomatic but completely ambulatory  Medications:  Allergies as of 08/11/2021       Reactions   Codeine Nausea And Vomiting   Lisinopril Cough   Nsaids Other (See Comments)   CKD   Latex Hives        Medication List        Accurate as of August 11, 2021  3:05 PM. If you have any questions, ask your nurse or doctor.          ascorbic acid 500 MG tablet Commonly known as: VITAMIN C Take by mouth.   BD Insulin Syringe U/F 31G X 5/16" 0.5 ML Misc Generic drug: Insulin Syringe-Needle U-100 E.11.9- monitor blood glucose tid for IDDM   blood glucose meter kit and supplies Kit Dispense based on patient and insurance preference. Use up to four times daily as directed.   cholecalciferol 25 MCG (1000 UNIT) tablet Commonly known as: VITAMIN D3 Take 1,000 Units by mouth daily.   co-enzyme Q-10 30 MG capsule Take 30 mg by mouth daily.   dofetilide 125 MCG capsule Commonly known as:  TIKOSYN TAKE 1 CAPSULE BY MOUTH TWICE A DAY   Eliquis 5 MG Tabs tablet Generic drug: apixaban TAKE 1 TABLET BY MOUTH TWICE A DAY   ezetimibe 10 MG tablet Commonly known as: ZETIA Take 1 tablet (10 mg total) by mouth daily.   ferrous sulfate 325 (65 FE) MG tablet Take 1 tablet (325 mg total) by mouth every other day.   fluticasone 50 MCG/ACT nasal spray Commonly known as: FLONASE Place 2 sprays into both nostrils daily.   GENTEAL OP Place 1 drop into both eyes daily as needed (dry eyes).   insulin glargine 100 UNIT/ML injection Commonly known as: LANTUS 30-35 units subcu injection in the morning and 35 units subcu injection before bed.   loratadine 10 MG tablet Commonly known as: CLARITIN Take 10 mg by mouth daily as needed for allergies.   Magnesium 250 MG Tabs Take by mouth.   metoprolol tartrate 25 MG tablet Commonly known as: LOPRESSOR Take 0.5 tablets (12.5 mg total) by mouth 2 (two) times daily.   nitroGLYCERIN 0.4 MG SL tablet Commonly known as: NITROSTAT PLACE 1 TABLET (0.4 MG TOTAL) UNDER THE TONGUE EVERY 5 (FIVE) MINUTES AS NEEDED FOR CHEST PAIN.   olmesartan 40 MG tablet Commonly known as: BENICAR Take 40 mg by mouth daily.   OneTouch  Delica Plus [id number] Misc ONETOUCH VERIO LANCETS USE TO TEST YOUR BLOOD SUGAR TWICE A DAY FINGER STICK   OneTouch Verio test strip Generic drug: glucose blood USE UP TO 4 TIMES DAILY AS DIRECTED   polyethylene glycol powder 17 GM/SCOOP powder Commonly known as: GLYCOLAX/MIRALAX Take by mouth.   rosuvastatin 20 MG tablet Commonly known as: CRESTOR Take 1 tablet (20 mg total) by mouth at bedtime.   Rybelsus 7 MG Tabs Generic drug: Semaglutide Take 1 tablet by mouth daily with breakfast.        Allergies:  Allergies  Allergen Reactions   Codeine Nausea And Vomiting   Lisinopril Cough   Nsaids Other (See Comments)    CKD   Latex Hives    Past Medical History, Surgical history, Social history, and Family  History were reviewed and updated.  Review of Systems: All other 10 point review of systems is negative.   Physical Exam:  weight is 169 lb 6.4 oz (76.8 kg). His oral temperature is 98 F (36.7 C). His blood pressure is 145/53 (abnormal) and his pulse is 61. His respiration is 18 and oxygen saturation is 99%.   Wt Readings from Last 3 Encounters:  08/11/21 169 lb 6.4 oz (76.8 kg)  07/17/21 172 lb 9.6 oz (78.3 kg)  07/15/21 172 lb (78 kg)    Ocular: Sclerae unicteric, pupils equal, round and reactive to light Ear-nose-throat: Oropharynx clear, dentition fair Lymphatic: No cervical or supraclavicular adenopathy Lungs no rales or rhonchi, good excursion bilaterally Heart regular rate and rhythm, no murmur appreciated Abd soft, nontender, positive bowel sounds MSK no focal spinal tenderness, no joint edema Neuro: non-focal, well-oriented, appropriate affect Breasts: Deferred   Lab Results  Component Value Date   WBC 10.0 08/11/2021   HGB 16.1 08/11/2021   HCT 46.5 08/11/2021   MCV 89.4 08/11/2021   PLT 261 08/11/2021   Lab Results  Component Value Date   FERRITIN 94 02/04/2021   IRON 98 02/04/2021   TIBC 345 02/04/2021   UIBC 247 02/04/2021   IRONPCTSAT 28 02/04/2021   Lab Results  Component Value Date   RETICCTPCT 1.5 02/04/2021   RBC 5.20 08/11/2021   Lab Results  Component Value Date   KPAFRELGTCHN 24.4 (H) 03/27/2021   LAMBDASER 827.5 (H) 03/27/2021   KAPLAMBRATIO 0.06 (L) 05/20/2021   Lab Results  Component Value Date   IGGSERUM 491 (L) 03/27/2021   IGA 55 (L) 03/27/2021   IGMSERUM 18 03/27/2021   Lab Results  Component Value Date   TOTALPROTELP 6.1 03/27/2021   ALBUMINELP 3.8 03/27/2021   A1GS 0.2 03/27/2021   A2GS 0.8 03/27/2021   BETS 0.9 03/27/2021   GAMS 0.4 03/27/2021   MSPIKE Not Observed 03/27/2021   SPEI Comment 03/27/2021     Chemistry      Component Value Date/Time   NA 139 02/04/2021 0832   NA 135 03/18/2020 1018   K 4.6  02/04/2021 0832   CL 105 02/04/2021 0832   CO2 26 02/04/2021 0832   BUN 21 02/04/2021 0832   BUN 30 (H) 03/18/2020 1018   CREATININE 1.35 (H) 02/04/2021 0832      Component Value Date/Time   CALCIUM 9.9 02/04/2021 0832   ALKPHOS 54 02/04/2021 0832   AST 24 02/04/2021 0832   ALT 20 02/04/2021 0832   BILITOT 0.4 02/04/2021 0832       Impression and Plan: Mr. Haslam is a very pleasant 76 yo caucasian gentleman with iron deficiency anemia secondary to GI  blood loss.  Iron studies pending.  Follow-up in 6 months.   Lottie Dawson, NP 2/21/20233:05 PM

## 2021-08-12 ENCOUNTER — Telehealth: Payer: Self-pay | Admitting: *Deleted

## 2021-08-12 LAB — IRON AND IRON BINDING CAPACITY (CC-WL,HP ONLY)
Iron: 79 ug/dL (ref 45–182)
Saturation Ratios: 19 % (ref 17.9–39.5)
TIBC: 409 ug/dL (ref 250–450)
UIBC: 330 ug/dL (ref 117–376)

## 2021-08-12 LAB — FERRITIN: Ferritin: 63 ng/mL (ref 24–336)

## 2021-08-12 LAB — IGG, IGA, IGM
IgA: 57 mg/dL — ABNORMAL LOW (ref 61–437)
IgG (Immunoglobin G), Serum: 468 mg/dL — ABNORMAL LOW (ref 603–1613)
IgM (Immunoglobulin M), Srm: 17 mg/dL (ref 15–143)

## 2021-08-12 LAB — KAPPA/LAMBDA LIGHT CHAINS
Kappa free light chain: 31.9 mg/L — ABNORMAL HIGH (ref 3.3–19.4)
Kappa, lambda light chain ratio: 0.03 — ABNORMAL LOW (ref 0.26–1.65)
Lambda free light chains: 1044.4 mg/L — ABNORMAL HIGH (ref 5.7–26.3)

## 2021-08-12 NOTE — Telephone Encounter (Signed)
Per 08/11/21 los - called and gave upcoming appointments - confirmed

## 2021-08-13 ENCOUNTER — Other Ambulatory Visit: Payer: Self-pay | Admitting: *Deleted

## 2021-08-13 ENCOUNTER — Inpatient Hospital Stay: Payer: Medicare Other

## 2021-08-13 ENCOUNTER — Other Ambulatory Visit: Payer: Self-pay

## 2021-08-13 DIAGNOSIS — D649 Anemia, unspecified: Secondary | ICD-10-CM

## 2021-08-13 DIAGNOSIS — Z79899 Other long term (current) drug therapy: Secondary | ICD-10-CM | POA: Diagnosis not present

## 2021-08-13 DIAGNOSIS — R778 Other specified abnormalities of plasma proteins: Secondary | ICD-10-CM

## 2021-08-13 DIAGNOSIS — D500 Iron deficiency anemia secondary to blood loss (chronic): Secondary | ICD-10-CM | POA: Diagnosis not present

## 2021-08-13 DIAGNOSIS — Z7901 Long term (current) use of anticoagulants: Secondary | ICD-10-CM | POA: Diagnosis not present

## 2021-08-13 DIAGNOSIS — K922 Gastrointestinal hemorrhage, unspecified: Secondary | ICD-10-CM | POA: Diagnosis not present

## 2021-08-13 LAB — PROTEIN ELECTROPHORESIS, SERUM
A/G Ratio: 1.5 (ref 0.7–1.7)
Albumin ELP: 3.7 g/dL (ref 2.9–4.4)
Alpha-1-Globulin: 0.2 g/dL (ref 0.0–0.4)
Alpha-2-Globulin: 0.8 g/dL (ref 0.4–1.0)
Beta Globulin: 1 g/dL (ref 0.7–1.3)
Gamma Globulin: 0.5 g/dL (ref 0.4–1.8)
Globulin, Total: 2.5 g/dL (ref 2.2–3.9)
Total Protein ELP: 6.2 g/dL (ref 6.0–8.5)

## 2021-08-17 LAB — UIFE/LIGHT CHAINS/TP QN, 24-HR UR
FR KAPPA LT CH,24HR: 82.08 mg/24 hr
FR LAMBDA LT CH,24HR: 983.81 mg/24 hr
Free Kappa Lt Chains,Ur: 71.37 mg/L (ref 1.17–86.46)
Free Kappa/Lambda Ratio: 0.08 — ABNORMAL LOW (ref 1.83–14.26)
Free Lambda Lt Chains,Ur: 855.49 mg/L — ABNORMAL HIGH (ref 0.27–15.21)
Total Protein, Urine-Ur/day: 346 mg/24 hr — ABNORMAL HIGH (ref 30–150)
Total Protein, Urine: 30.1 mg/dL
Total Volume: 1150

## 2021-08-28 DIAGNOSIS — M545 Low back pain, unspecified: Secondary | ICD-10-CM | POA: Diagnosis not present

## 2021-08-28 DIAGNOSIS — M25562 Pain in left knee: Secondary | ICD-10-CM | POA: Diagnosis not present

## 2021-08-30 ENCOUNTER — Encounter: Payer: Self-pay | Admitting: Cardiovascular Disease

## 2021-08-30 ENCOUNTER — Other Ambulatory Visit: Payer: Self-pay | Admitting: Cardiovascular Disease

## 2021-08-30 NOTE — Progress Notes (Signed)
Marvin Salinas Date of Birth  1946/04/18       [facility] [address], [place], [place]  [address]   [place], [place]  [address] [phone]     [phone]   Fax  [phone]    Fax [phone]  Problem List: 1. Coronary artery disease-status post CABG-2001 2. Hypertension 3. Hyperlipidemia 4. Diabetes mellitus 5. Extensive cerebrovascular disease with prior TIAs and known basilar artery stenosis    Marvin Salinas is seen back today for a 3 month check.  He is a former patient of Dr. Susa Simmonds. He has known CAD with prior CABG back in 2011. Other issues include HTN, HLD, DM and extensive cerebral vascular disease with prior TIA's and known basilar artery stenosis. He is on chronic Plavix and aspirin therapy. Was on coumadin in the remote past.  Pt has done well. Complains of back pain.  He gets some regular exercise - walks on occasion.  September 08, 2015: Marvin Salinas is seen back after a 3-4 year absence. Former patient of [facility].  CAD - CABG in 2011,   Cerebral vascular disease   Doing well,  No CP , no further TIAs  HR is slow,  No syncope or presyncope.   HR has been slow for a while  Exercises,  Still paints houses.   Oct 20, 2016:  Marvin Salinas is doing well from a cardiac [place]. No CP or dyspnea Has leg cramp / burning with exercise Is on pravachol for hyperlipidemia,  Has not tolerated the other statins   Sept. 26, 2019:  Marvin Salinas has done well.  He was admitted to the hospital in April, 2019 with a non-ST segment elevation myocardial infarction.  He also had developed atrial flutter with rapid rates.  He spontaneously converted to sinus rhythm.  He was started on Tikosyn at that time.  Heart catheterization revealed patent grafts.  Has had rare episodes of chest pressure - thinks it was indigestion. Processes on a regular basis.  He walks regularly.  He also has a history of hyperlipidemia and diabetes  mellitus.  October 01, 2019:  Marvin Salinas is seen today for follow-up of his coronary artery disease, hyperlipidemia, atrial fibrillation and peripheral arterial disease.   Dr. Fletcher Anon performed successful angioplasty and stent placement to the left common iliac artery.  He is been doing well with minimal claudication since that time. Still having knee pain and hip pain .   No CP  Still smokes - wants to quit.    Sept. 14, 2021  Marvin Salinas is seen today for follow up of his CAD, atrial fib, PAD  Still smoking  Sees Mujhamad Arida - had peripheral stenting in Dec.  Has cad, - hx of CABG Needs a back injection He is at low risk for his upcoming injection and may hold his xarelto for 3 days prior to injection    Sept. 12, 2022  Marvin Salinas is seen for follow up visit Hx of CAD, PAD   Lipids from May 5, , 2022 look good.  Still smoking - has cut back  Has PVD - knows that he needs to stop smoking    August 31, 2021 Marvin Salinas is seen for follow up visit Has CAD, PAD Still smoking  Has cut his smoking ,  working on quitting .   Sees Arida  for PAD    Current Outpatient Medications on File Prior to Visit  Medication Sig  Dispense Refill   ascorbic acid (VITAMIN C) 500 MG tablet Take by mouth.     BD INSULIN SYRINGE U/F 31G X 5/16" 0.5 ML MISC E.11.9- monitor blood glucose tid for IDDM 100 each 4   blood glucose meter kit and supplies KIT Dispense based on patient and insurance preference. Use up to four times daily as directed. 1 each 0   Carboxymethylcell-Hypromellose (GENTEAL OP) Place 1 drop into both eyes daily as needed (dry eyes).     cholecalciferol (VITAMIN D3) 25 MCG (1000 UNIT) tablet Take 1,000 Units by mouth daily.     co-enzyme Q-10 30 MG capsule Take 30 mg by mouth daily.     dofetilide (TIKOSYN) 125 MCG capsule TAKE 1 CAPSULE BY MOUTH TWICE A DAY 180 capsule 1   ELIQUIS 5 MG TABS tablet TAKE 1 TABLET BY MOUTH TWICE A DAY 180 tablet 1   ezetimibe (ZETIA) 10 MG tablet Take 1 tablet (10 mg  total) by mouth daily. 90 tablet 3   ferrous sulfate 325 (65 FE) MG tablet Take 1 tablet (325 mg total) by mouth every other day.     fluticasone (FLONASE) 50 MCG/ACT nasal spray Place 2 sprays into both nostrils daily.     insulin glargine (LANTUS) 100 UNIT/ML injection 30-35 units subcu injection in the morning and 35 units subcu injection before bed. 30 mL 5   Lancets (ONETOUCH DELICA PLUS [id number]) MISC ONETOUCH VERIO LANCETS USE TO TEST YOUR BLOOD SUGAR TWICE A DAY FINGER STICK     loratadine (CLARITIN) 10 MG tablet Take 10 mg by mouth daily as needed for allergies.     Magnesium 250 MG TABS Take by mouth.     metoprolol tartrate (LOPRESSOR) 25 MG tablet Take 0.5 tablets (12.5 mg total) by mouth 2 (two) times daily. 90 tablet 3   nitroGLYCERIN (NITROSTAT) 0.4 MG SL tablet PLACE 1 TABLET (0.4 MG TOTAL) UNDER THE TONGUE EVERY 5 (FIVE) MINUTES AS NEEDED FOR CHEST PAIN. 25 tablet 12   olmesartan (BENICAR) 40 MG tablet Take 40 mg by mouth daily.     ONETOUCH VERIO test strip USE UP TO 4 TIMES DAILY AS DIRECTED 100 strip 3   polyethylene glycol powder (GLYCOLAX/MIRALAX) 17 GM/SCOOP powder Take by mouth.     rosuvastatin (CRESTOR) 20 MG tablet Take 1 tablet (20 mg total) by mouth at bedtime. 90 tablet 3   Semaglutide (RYBELSUS) 7 MG TABS Take 1 tablet by mouth daily with breakfast. 90 tablet 1   No current facility-administered medications on file prior to visit.     Allergies  Allergen Reactions   Codeine Nausea And Vomiting   Lisinopril Cough   Nsaids Other (See Comments)    CKD   Latex Hives    Past Medical History:  Diagnosis Date   Acute kidney injury superimposed on chronic kidney disease ([place] [facility]) 10/12/2017   Atrial fibrillation with RVR ([place]) 10/27/2017   Back pain    Basilar artery stenosis    on chronic Plavix   Benign prostatic hypertrophy without urinary obstruction 04/17/2014   Carotid artery disease ([place]) 10/06/2010   Carotid US 04/2019: Bilat ICA 40-59; L subclavian  stenosis  // Carotid US 11/21: Bilat ICA 40-59; bilateral subclavian stenosis // Carotid US 11/22: Bilateral ICA 40-59; left subclavian stenosis   Cerebral vascular disease    with prior TIA's; followed by Dr. Erling Cruz   Depression, neurotic 11/21/2013   Diabetes mellitus    on insulin   Enthesopathy of ankle and tarsus 09/04/2007  Overview:  Metatarsalgia    Enthesopathy of ankle and tarsus 09/04/2007   Overview:  Metatarsalgia  Formatting of this note might be different from the original. Metatarsalgia  10/1 IMO update   History of renal calculi    Hyperlipidemia    Hypertension    Ischemic heart disease    prior PCI to RCA in 1989. S/P PCI to LAD and OM in 1992. S/P PCI to first DX in 2000. S/P CABG x 3 in May 2011   Left hip pain 01/03/2020   OSA (obstructive sleep apnea) 01/11/2018   AHI 18.1 and SaO2 low 73%   PAD (peripheral artery disease) ([facility]) 11/18/2017   ABIs/Arterial US 04/2019: R 1.21; L 0.66 // R SFA 30-49, stable > 50 CIA and EIA stenosis; L > 50 CIA stenosis (likely represents severe stenosis or short segment occlusion)   Peripheral neuropathy    Pneumonia 02/2011   Sacroiliac joint dysfunction of left side 01/03/2020   Stroke ([facility]) 04/16/2001   small right cerebellar infarct on 04/16/2001 at that time he was found to have proximal left vertebral artery, proximal left common carotid artery and both external carotid artery stenosis as well as intracranial stenosis involving mid basilar artery- 07/2011 add questionable TIA    Past Surgical History:  Procedure Laterality Date    NASAL ENDOSCOPY  01/11/2020   chronic rhinitis w/o evidence of acute sinusitis, bilateral inferior turbinate hypertrophy   ABDOMINAL AORTOGRAM W/LOWER EXTREMITY Bilateral 05/30/2019   Procedure: ABDOMINAL AORTOGRAM W/LOWER EXTREMITY;  Surgeon: Wellington Hampshire, MD;  Location: [facility];  Service: Cardiovascular;  Laterality: Bilateral;   ANGIOPLASTY  1989   right coronary artery    ANGIOPLASTY  1992   LAD and OM   ANGIOPLASTY  1998   First DX   BRAIN SURGERY     on prior records   CORONARY ARTERY BYPASS GRAFT  11/12/2009   LIMA to LAD, SVG to OM and SVG to RCA   CORONARY STENT PLACEMENT  2000   Stent to LAD/Circumflex with angioplasty to first diagonal    LEFT HEART CATH AND CORS/GRAFTS ANGIOGRAPHY N/A 10/13/2017   Procedure: LEFT HEART CATH AND CORS/GRAFTS ANGIOGRAPHY;  Surgeon: Nelva Bush, MD;  Location: [facility];  Service: Cardiovascular;  Laterality: N/A;    Social History   Tobacco Use  Smoking Status Former   Packs/day: 0.25   Years: 54.00   Pack years: 13.50   Types: Cigarettes   Quit date: 2022   Years since quitting: 1.1  Smokeless Tobacco Never    Social History   Substance and Sexual Activity  Alcohol Use Yes    Family History  Problem Relation Age of Onset   Depression Mother    Early death Mother    Kidney disease Mother    Ovarian cancer Mother    Hypertension Father    Heart disease Father    Heart attack Father    Asthma Brother    Diabetes Brother    Stroke Other        Uncle   Diabetes Sister    Colon cancer Neg Hx    Rectal cancer Neg Hx    Stomach cancer Neg Hx    Esophageal cancer Neg Hx     Reviw of Systems:  Reviewed in the HPI.  All other systems are negative.   Physical Exam: Blood pressure 132/60, pulse 60, height 5' 8" (1.727 m), weight 170 lb 6.4 oz (77.3 kg), SpO2 98 %.  GEN:  Well nourished, well developed in no acute distress HEENT: Normal NECK: No JVD; bilat carotid bruits  LYMPHATICS: No lymphadenopathy CARDIAC: RRR , soft systolic murmur  RESPIRATORY:  Clear to auscultation without rales, wheezing or rhonchi  ABDOMEN: Soft, non-tender, non-distended MUSCULOSKELETAL:  No edema; No deformity  SKIN: Warm and dry NEUROLOGIC:  Alert and oriented x 3    ECG:         Assessment / Plan:   1. Coronary artery disease:  no angina .      2.  Atrial fib: no recent Afib by event  monitor  Has some PACs  Followed by Dr. Quentin Ore  On Tikosyn     2. Hypertension -   well controlled.    3. Hyperlipidemia - check lipids, ALT , BMP today    4. Diabetes mellitus -  5.  Carotid artery disease:    has bilat bruits.   Cont meds. Encouraged smoking cessation      Mertie Moores, MD  08/31/2021 9:58 AM    [address],  [place], [place]  [address] Pager [phone] Phone: [phone]; Fax: [phone]

## 2021-08-31 ENCOUNTER — Telehealth: Payer: Self-pay | Admitting: Family

## 2021-08-31 ENCOUNTER — Encounter: Payer: Self-pay | Admitting: Cardiovascular Disease

## 2021-08-31 ENCOUNTER — Other Ambulatory Visit: Payer: Self-pay

## 2021-08-31 ENCOUNTER — Other Ambulatory Visit: Payer: Self-pay | Admitting: Family

## 2021-08-31 ENCOUNTER — Ambulatory Visit: Payer: Medicare Other | Admitting: Cardiovascular Disease

## 2021-08-31 VITALS — BP 132/60 | HR 60 | Ht 68.0 in | Wt 170.4 lb

## 2021-08-31 DIAGNOSIS — I739 Peripheral vascular disease, unspecified: Secondary | ICD-10-CM | POA: Diagnosis not present

## 2021-08-31 DIAGNOSIS — I4891 Unspecified atrial fibrillation: Secondary | ICD-10-CM | POA: Diagnosis not present

## 2021-08-31 DIAGNOSIS — I2510 Atherosclerotic heart disease of native coronary artery without angina pectoris: Secondary | ICD-10-CM | POA: Diagnosis not present

## 2021-08-31 DIAGNOSIS — D8989 Other specified disorders involving the immune mechanism, not elsewhere classified: Secondary | ICD-10-CM

## 2021-08-31 LAB — BASIC METABOLIC PANEL
BUN/Creatinine Ratio: 15 (ref 10–24)
BUN: 21 mg/dL (ref 8–27)
CO2: 24 mmol/L (ref 20–29)
Calcium: 9.8 mg/dL (ref 8.6–10.2)
Chloride: 104 mmol/L (ref 96–106)
Creatinine, Ser: 1.38 mg/dL — ABNORMAL HIGH (ref 0.76–1.27)
Glucose: 122 mg/dL — ABNORMAL HIGH (ref 70–99)
Potassium: 4.8 mmol/L (ref 3.5–5.2)
Sodium: 140 mmol/L (ref 134–144)
eGFR: 53 mL/min/{1.73_m2} — ABNORMAL LOW (ref >59)

## 2021-08-31 LAB — LIPID PANEL
Chol/HDL Ratio: 2.5 ratio (ref 0.0–5.0)
Cholesterol, Total: 130 mg/dL (ref 100–199)
HDL: 51 mg/dL (ref >39)
LDL Chol Calc (NIH): 56 mg/dL (ref 0–99)
Triglycerides: 129 mg/dL (ref 0–149)
VLDL Cholesterol Cal: 23 mg/dL (ref 5–40)

## 2021-08-31 LAB — ALT: ALT: 20 IU/L (ref 0–44)

## 2021-08-31 NOTE — Telephone Encounter (Signed)
Prescription refill request for Eliquis received. ?Indication: Afib  ?Last office visit: 08/31/21 ([name]) ?Scr: 1.69 (08/11/21) ?Age: 76 ?Weight: 76.8kg ? ?Appropriate dose and refill sent to requested pharmacy.  ?

## 2021-08-31 NOTE — Telephone Encounter (Signed)
Left message on patient's personal voicemail with call back number to discuss recent protein studies and 24 hour urine testing as well as setting up appointments for bone survey and bone marrow biopsy.  ?

## 2021-08-31 NOTE — Patient Instructions (Signed)
Medication Instructions:  ?Your physician recommends that you continue on your current medications as directed. Please refer to the Current Medication list given to you today. ? ?*If you need a refill on your cardiac medications before your next appointment, please call your pharmacy* ? ?Lab Work: ?TODAY: Lipids, ALT, BMP ?If you have labs (blood work) drawn today and your tests are completely normal, you will receive your results only by: ?MyChart Message (if you have MyChart) OR ?A paper copy in the mail ?If you have any lab test that is abnormal or we need to change your treatment, we will call you to review the results. ? ?Testing/Procedures: ?NONE ? ?Follow-Up: ?At [facility], you and your health needs are our priority.  As part of our continuing mission to provide you with exceptional heart care, we have created designated Provider Care Teams.  These Care Teams include your primary Cardiologist (physician) and Advanced Practice Providers (APPs -  Physician Assistants and Nurse Practitioners) who all work together to provide you with the care you need, when you need it. ? ?Your next appointment:   ?1 year(s) ? ?The format for your next appointment:   ?In Person ? ?Provider:   ?Mertie Moores, MD  or Robbie Lis, PA-C or Richardson Dopp, [place] ?

## 2021-09-01 ENCOUNTER — Encounter: Payer: Self-pay | Admitting: Neurology

## 2021-09-01 ENCOUNTER — Ambulatory Visit: Payer: Medicare Other | Admitting: Neurology

## 2021-09-01 VITALS — BP 141/74 | HR 57 | Ht 68.0 in | Wt 173.0 lb

## 2021-09-01 DIAGNOSIS — R55 Syncope and collapse: Secondary | ICD-10-CM | POA: Diagnosis not present

## 2021-09-01 DIAGNOSIS — I672 Cerebral atherosclerosis: Secondary | ICD-10-CM

## 2021-09-01 NOTE — Patient Instructions (Signed)
Good to meet you. ? ?Schedule MRI brain without contrast, MRA head without contrast ? ?2. Continue all your medications, control of blood pressure, cholesterol, glucose levels ? ?3. Our office will call with results, if no significant changes, follow-up as needed, call for any new concerns ?

## 2021-09-01 NOTE — Progress Notes (Signed)
NEUROLOGY CONSULTATION NOTE  Marvin Salinas MRN: [id number] DOB: May 04, 1946  Referring provider: Dr. Steffanie Dunn Primary care provider: Dr. Felix Pacini  Reason for consult:  headache, visual changes  Dear Dr Lalla Brothers:  Thank you for your kind referral of Marvin Salinas for consultation of the above symptoms. Although his history is well known to you, please allow me to reiterate it for the purpose of our medical record. He is alone in the office today. Records and images were personally reviewed where available.   HISTORY OF PRESENT ILLNESS: This is a very pleasant 76 year old right-handed man with a history of hypertension, hyperlipidemia, DM, CAD s/p CABG, previously seen at [facility] in 2015 for moderate extracranial carotid stenosis and severe proximal basilar and distal right vertebral artery stenosis with failed attempt at angioplasty stenting in the past, presenting for evaluation of headaches and visual changes. He reported these symptoms to Dr. Lalla Brothers on his 07/17/2021 visit. He states he would feel like he is going to faint, he feels lightheaded with vision becoming blurred and "all out of focus," lasting 30-60 seconds. There would be a slight headache associated with them, which concerned him because the only time he had headaches was when he had "mini-strokes" in the early 2000s but at that time he also had numbness in his face and neck. With these episodes, there was no associated focal numbness/tingling/weakness, speech changes, or loss of consciousness. They mostly occur when standing. He was having them quite frequently and taking prn Tylenol with resolution of headache. He feels that since he started taking the Rybelsus after eating instead of before breakfast, he has had significantly less of them, he has had only one in the past couple of weeks, last episode was Tuesday last week. He lives alone. He denies any staring/unresponsive episodes, gaps in time. He is very  compliant with medications and denies missing doses. He states he had been on aspirin and Plavix for many years, then on Plavix and Eliquis. In 2022, he developed iron deficiency anemia due to GI blood loss and Plavix was stopped. He monitors his BP at home, usually at 125-130/60. He drinks 3-4 glasses of water daily. He does not sleep well, he has been so stressed due to relationship issues the past 6-7 months with 3-4 hours of sleep, some nights he does not sleep at all. He has chronic back pain radiating down the left leg and left knee pain s/p injection. His bones hurt, he is scheduled for a bone biopsy in April. He has some constipation due to iron supplements.   Laboratory Data: Lab Results  Component Value Date   CHOL 130 08/31/2021   HDL 51 08/31/2021   LDLCALC 56 08/31/2021   TRIG 129 08/31/2021   CHOLHDL 2.5 08/31/2021   Lab Results  Component Value Date   HGBA1C 8.4 (A) 07/15/2021   HGBA1C 8.4 07/15/2021   HGBA1C 8.4 (A) 07/15/2021   HGBA1C 8.4 (A) 07/15/2021    PAST MEDICAL HISTORY: Past Medical History:  Diagnosis Date   Acute kidney injury superimposed on chronic kidney disease (HCC) 10/12/2017   Atrial fibrillation with RVR (HCC) 10/27/2017   Back pain    Basilar artery stenosis    on chronic Plavix   Benign prostatic hypertrophy without urinary obstruction 04/17/2014   Carotid artery disease (HCC) 10/06/2010   Carotid US 04/2019: Bilat ICA 40-59; L subclavian stenosis  // Carotid US 11/21: Bilat ICA 40-59; bilateral subclavian stenosis // Carotid US 11/22: Bilateral  ICA 40-59; left subclavian stenosis   Cerebral vascular disease    with prior TIA's; followed by Dr. Sandria Manly   Depression, neurotic 11/21/2013   Diabetes mellitus    on insulin   Enthesopathy of ankle and tarsus 09/04/2007   Overview:  Metatarsalgia    Enthesopathy of ankle and tarsus 09/04/2007   Overview:  Metatarsalgia  Formatting of this note might be different from the original. Metatarsalgia  10/1  IMO update   History of renal calculi    Hyperlipidemia    Hypertension    Ischemic heart disease    prior PCI to RCA in 1989. S/P PCI to LAD and OM in 1992. S/P PCI to first DX in 2000. S/P CABG x 3 in May 2011   Left hip pain 01/03/2020   OSA (obstructive sleep apnea) 01/11/2018   AHI 18.1 and SaO2 low 73%   PAD (peripheral artery disease) (HCC) 11/18/2017   ABIs/Arterial US 04/2019: R 1.21; L 0.66 // R SFA 30-49, stable > 50 CIA and EIA stenosis; L > 50 CIA stenosis (likely represents severe stenosis or short segment occlusion)   Peripheral neuropathy    Pneumonia 02/2011   Sacroiliac joint dysfunction of left side 01/03/2020   Stroke (HCC) 04/16/2001   small right cerebellar infarct on 04/16/2001 at that time he was found to have proximal left vertebral artery, proximal left common carotid artery and both external carotid artery stenosis as well as intracranial stenosis involving mid basilar artery- 07/2011 add questionable TIA    PAST SURGICAL HISTORY: Past Surgical History:  Procedure Laterality Date    NASAL ENDOSCOPY  01/11/2020   chronic rhinitis w/o evidence of acute sinusitis, bilateral inferior turbinate hypertrophy   ABDOMINAL AORTOGRAM W/LOWER EXTREMITY Bilateral 05/30/2019   Procedure: ABDOMINAL AORTOGRAM W/LOWER EXTREMITY;  Surgeon: Iran Ouch, MD;  Location: [facility];  Service: Cardiovascular;  Laterality: Bilateral;   ANGIOPLASTY  1989   right coronary artery   ANGIOPLASTY  1992   LAD and OM   ANGIOPLASTY  1998   First DX   BRAIN SURGERY     on prior records   CORONARY ARTERY BYPASS GRAFT  11/12/2009   LIMA to LAD, SVG to OM and SVG to RCA   CORONARY STENT PLACEMENT  2000   Stent to LAD/Circumflex with angioplasty to first diagonal    LEFT HEART CATH AND CORS/GRAFTS ANGIOGRAPHY N/A 10/13/2017   Procedure: LEFT HEART CATH AND CORS/GRAFTS ANGIOGRAPHY;  Surgeon: Yvonne Kendall, MD;  Location: [facility];  Service: Cardiovascular;   Laterality: N/A;    MEDICATIONS: Current Outpatient Medications on File Prior to Visit  Medication Sig Dispense Refill   ascorbic acid (VITAMIN C) 500 MG tablet Take by mouth.     BD INSULIN SYRINGE U/F 31G X 5/16" 0.5 ML MISC E.11.9- monitor blood glucose tid for IDDM 100 each 4   blood glucose meter kit and supplies KIT Dispense based on patient and insurance preference. Use up to four times daily as directed. 1 each 0   Carboxymethylcell-Hypromellose (GENTEAL OP) Place 1 drop into both eyes daily as needed (dry eyes).     cholecalciferol (VITAMIN D3) 25 MCG (1000 UNIT) tablet Take 1,000 Units by mouth daily.     co-enzyme Q-10 30 MG capsule Take 30 mg by mouth daily.     dofetilide (TIKOSYN) 125 MCG capsule TAKE 1 CAPSULE BY MOUTH TWICE A DAY 180 capsule 1   ELIQUIS 5 MG TABS tablet TAKE 1 TABLET BY MOUTH  TWICE A DAY 180 tablet 1   ezetimibe (ZETIA) 10 MG tablet Take 1 tablet (10 mg total) by mouth daily. 90 tablet 3   ferrous sulfate 325 (65 FE) MG tablet Take 1 tablet (325 mg total) by mouth every other day.     fluticasone (FLONASE) 50 MCG/ACT nasal spray Place 2 sprays into both nostrils daily.     insulin glargine (LANTUS) 100 UNIT/ML injection 30-35 units subcu injection in the morning and 35 units subcu injection before bed. 30 mL 5   Lancets (ONETOUCH DELICA PLUS LANCET33G) MISC ONETOUCH VERIO LANCETS USE TO TEST YOUR BLOOD SUGAR TWICE A DAY FINGER STICK     loratadine (CLARITIN) 10 MG tablet Take 10 mg by mouth daily as needed for allergies.     Magnesium 250 MG TABS Take by mouth.     metoprolol tartrate (LOPRESSOR) 25 MG tablet Take 0.5 tablets (12.5 mg total) by mouth 2 (two) times daily. 90 tablet 3   nitroGLYCERIN (NITROSTAT) 0.4 MG SL tablet PLACE 1 TABLET (0.4 MG TOTAL) UNDER THE TONGUE EVERY 5 (FIVE) MINUTES AS NEEDED FOR CHEST PAIN. 25 tablet 12   olmesartan (BENICAR) 40 MG tablet Take 40 mg by mouth daily.     ONETOUCH VERIO test strip USE UP TO 4 TIMES DAILY AS DIRECTED  100 strip 3   polyethylene glycol powder (GLYCOLAX/MIRALAX) 17 GM/SCOOP powder Take by mouth.     rosuvastatin (CRESTOR) 20 MG tablet Take 1 tablet (20 mg total) by mouth at bedtime. 90 tablet 3   Semaglutide (RYBELSUS) 7 MG TABS Take 1 tablet by mouth daily with breakfast. 90 tablet 1   No current facility-administered medications on file prior to visit.    ALLERGIES: Allergies  Allergen Reactions   Codeine Nausea And Vomiting   Lisinopril Cough   Nsaids Other (See Comments)    CKD   Latex Hives    FAMILY HISTORY: Family History  Problem Relation Age of Onset   Depression Mother    Early death Mother    Kidney disease Mother    Ovarian cancer Mother    Hypertension Father    Heart disease Father    Heart attack Father    Asthma Brother    Diabetes Brother    Stroke Other        Uncle   Diabetes Sister    Colon cancer Neg Hx    Rectal cancer Neg Hx    Stomach cancer Neg Hx    Esophageal cancer Neg Hx     SOCIAL HISTORY: Social History   Socioeconomic History   Marital status: Divorced    Spouse name: Not on file   Number of children: 0   Years of education: GED   Highest education level: Not on file  Occupational History   Occupation: retired  Tobacco Use   Smoking status: Some Days    Packs/day: 0.25    Years: 54.00    Pack years: 13.50    Types: Cigarettes    Last attempt to quit: 2022    Years since quitting: 1.1   Smokeless tobacco: Never   Tobacco comments:    Smokes occasionally   Vaping Use   Vaping Use: Never used  Substance and Sexual Activity   Alcohol use: Never   Drug use: No   Sexual activity: Not Currently    Partners: Female  Other Topics Concern   Not on file  Social History Narrative   Marital status/children/pets: divorced.   Education/employment: retired, Patient  has his GED. 9th grade education.    Safety:      -smoke alarm in the home:Yes     - wears seatbelt: Yes   Patient is right handed.   Patient does not drink any  caffeine.   Lives in a one story home    Social Determinants of Health   Financial Resource Strain: Low Risk    Difficulty of Paying Living Expenses: Not very hard  Food Insecurity: No Food Insecurity   Worried About Programme researcher, broadcasting/film/video in the Last Year: Never true   Ran Out of Food in the Last Year: Never true  Transportation Needs: No Transportation Needs   Lack of Transportation (Medical): No   Lack of Transportation (Non-Medical): No  Physical Activity: Insufficiently Active   Days of Exercise per Week: 4 days   Minutes of Exercise per Session: 10 min  Stress: No Stress Concern Present   Feeling of Stress : Not at all  Social Connections: Socially Isolated   Frequency of Communication with Friends and Family: Twice a week   Frequency of Social Gatherings with Friends and Family: Twice a week   Attends Religious Services: Never   Database administrator or Organizations: No   Attends Engineer, structural: Never   Marital Status: Divorced  Catering manager Violence: Not At Risk   Fear of Current or Ex-Partner: No   Emotionally Abused: No   Physically Abused: No   Sexually Abused: No     PHYSICAL EXAM: Vitals:   09/01/21 0839  BP: (!) 141/74  Pulse: (!) 57  SpO2: 98%   General: No acute distress Head:  Normocephalic/atraumatic Skin/Extremities: No rash, no edema Neurological Exam: Mental status: alert and awake, no dysarthria or aphasia, Fund of knowledge is appropriate.  Recent and remote memory are intact.  Attention and concentration are normal.    Cranial nerves: CN I: not tested CN II: pupils equal, round and reactive to light, visual fields intact CN III, IV, VI:  full range of motion, no nystagmus, no ptosis CN V: facial sensation intact CN VII: upper and lower face symmetric CN VIII: hearing intact to conversation CN IX, X: gag intact, uvula midline CN XI: sternocleidomastoid and trapezius muscles intact CN XII: tongue midline Bulk & Tone:  normal, no fasciculations. Motor: 5/5 throughout with no pronator drift. Sensation: intact to light touch, cold, pin, vibration sense.  No extinction to double simultaneous stimulation.  Romberg test negative Deep Tendon Reflexes: +1 throughout, no ankle clonus Plantar responses: downgoing bilaterally Cerebellar: no incoordination on finger to nose testing Gait: narrow-based, slightly favoring left leg due to pain, no ataxia Tremor: none   IMPRESSION: This is a very pleasant 76 year old right-handed man with a history of hypertension, hyperlipidemia, DM, CAD s/p CABG, previously seen at [facility] in 2015 for moderate extracranial carotid stenosis and severe proximal basilar and distal right vertebral artery stenosis with failed attempt at angioplasty stenting in the past, presenting for evaluation of headaches and visual changes. His neurological exam today is normal. He feels that since taking Rybelsus after eating instead of before breakfast, he has had significantly less of them. We discussed history of intracranial stenosis and possibly hypoperfusion through diseased vertebrobasilar system leading to his symptoms. MRI brain without contrast and MRA head without contrast will be ordered. We discussed control of vascular risk factors, last HbA1c was still 8.4, continue lipid and BP control but avoid hypotension. Advised to increase hydration. He had GI blood  loss in 2022 while on Plavix and Eliquis, Plavix discontinued at that time. Our office will call with MRI/MRA results, if no significant changes, follow-up as needed, he knows to call for any changes.    Thank you for allowing me to participate in the care of this patient. Please do not hesitate to call for any questions or concerns.   Patrcia Dolly, M.D.  CC: Dr. Lalla Brothers, Dr. Melburn Popper, Dr. Claiborne Billings

## 2021-09-03 DIAGNOSIS — E559 Vitamin D deficiency, unspecified: Secondary | ICD-10-CM | POA: Diagnosis not present

## 2021-09-03 DIAGNOSIS — R809 Proteinuria, unspecified: Secondary | ICD-10-CM | POA: Diagnosis not present

## 2021-09-03 DIAGNOSIS — N189 Chronic kidney disease, unspecified: Secondary | ICD-10-CM | POA: Diagnosis not present

## 2021-09-03 DIAGNOSIS — E1122 Type 2 diabetes mellitus with diabetic chronic kidney disease: Secondary | ICD-10-CM | POA: Diagnosis not present

## 2021-09-03 DIAGNOSIS — E1129 Type 2 diabetes mellitus with other diabetic kidney complication: Secondary | ICD-10-CM | POA: Diagnosis not present

## 2021-09-04 ENCOUNTER — Ambulatory Visit (HOSPITAL_COMMUNITY)
Admission: RE | Admit: 2021-09-04 | Discharge: 2021-09-04 | Disposition: A | Discharge status: Home / Self Care | Payer: Medicare Other | Source: Ambulatory Visit | Attending: Family | Admitting: Family

## 2021-09-04 ENCOUNTER — Other Ambulatory Visit: Payer: Self-pay

## 2021-09-04 DIAGNOSIS — D8989 Other specified disorders involving the immune mechanism, not elsewhere classified: Secondary | ICD-10-CM | POA: Insufficient documentation

## 2021-09-04 DIAGNOSIS — E8581 Light chain (AL) amyloidosis: Secondary | ICD-10-CM | POA: Diagnosis not present

## 2021-09-05 ENCOUNTER — Other Ambulatory Visit: Payer: Self-pay | Admitting: Family Medicine

## 2021-09-10 DIAGNOSIS — D472 Monoclonal gammopathy: Secondary | ICD-10-CM | POA: Diagnosis not present

## 2021-09-10 DIAGNOSIS — R809 Proteinuria, unspecified: Secondary | ICD-10-CM | POA: Diagnosis not present

## 2021-09-10 DIAGNOSIS — I129 Hypertensive chronic kidney disease with stage 1 through stage 4 chronic kidney disease, or unspecified chronic kidney disease: Secondary | ICD-10-CM | POA: Diagnosis not present

## 2021-09-10 DIAGNOSIS — E1129 Type 2 diabetes mellitus with other diabetic kidney complication: Secondary | ICD-10-CM | POA: Diagnosis not present

## 2021-09-10 DIAGNOSIS — E1122 Type 2 diabetes mellitus with diabetic chronic kidney disease: Secondary | ICD-10-CM | POA: Diagnosis not present

## 2021-09-10 DIAGNOSIS — N189 Chronic kidney disease, unspecified: Secondary | ICD-10-CM | POA: Diagnosis not present

## 2021-09-18 ENCOUNTER — Other Ambulatory Visit: Payer: Self-pay | Admitting: *Deleted

## 2021-09-18 ENCOUNTER — Ambulatory Visit
Admission: RE | Admit: 2021-09-18 | Discharge: 2021-09-18 | Disposition: A | Discharge status: Home / Self Care | Payer: Medicare Other | Source: Ambulatory Visit | Attending: Neurology | Admitting: Neurology

## 2021-09-18 ENCOUNTER — Other Ambulatory Visit: Payer: Self-pay | Admitting: Radiology

## 2021-09-18 DIAGNOSIS — I672 Cerebral atherosclerosis: Secondary | ICD-10-CM

## 2021-09-18 DIAGNOSIS — R55 Syncope and collapse: Secondary | ICD-10-CM | POA: Diagnosis not present

## 2021-09-18 NOTE — Patient Outreach (Signed)
[facility]) Care Management ? ?09/18/2021 ? ?[name] ?1945/09/25 ?[id number] ? ?Unsuccessful outreach attempt made to patient. RN Health Coach left HIPAA compliant voicemail message along with her contact information. ? ?Plan: ?RN Health Coach will call patient within the month of April. ? ?Emelia Loron RN, BSN ?[facility] Care Management  ?RN Health Coach ?[phone] ?[email or web address] ? ? ?

## 2021-09-20 NOTE — H&P (Incomplete)
? ?Chief Complaint: ?Patient was seen in consultation today for bone marrow biopsy and aspiration with moderate sedation ? at the request of Celso Amy ? ?Referring Physician(s): ?Celso Amy ? ?Supervising Physician: Jacqulynn Cadet ? ?Patient Status: [place] ? ?History of Present Illness: ?Marvin Salinas is a 76 y.o. male with past medical history significant for AKI on CKD, A-fib with RVR, basilar artery stenosis, CAD status post CABG, CVD, DM 2, HLD, HTN, OSA, PAD and CVA.  Patient is being followed by hematology/oncology for IDA secondary to intermittent GI blood loss receiving IV iron infusions as needed.  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  Patient referred to IR for bone marrow biopsy and aspiration for elevated lambda light chains and BUN/creatinine. ? ?Past Medical History:  ?Diagnosis Date  ? Acute kidney injury superimposed on chronic kidney disease ([place]) 10/12/2017  ? Atrial fibrillation with RVR ([place]) 10/27/2017  ? Back pain   ? Basilar artery stenosis   ? on chronic Plavix  ? Benign prostatic hypertrophy without urinary obstruction 04/17/2014  ? Carotid artery disease ([place]) 10/06/2010  ? Carotid US 04/2019: Bilat ICA 40-59; L subclavian stenosis  // Carotid US 11/21: Bilat ICA 40-59; bilateral subclavian stenosis // Carotid US 11/22: Bilateral ICA 40-59; left subclavian stenosis  ? Cerebral vascular disease   ? with prior TIA's; followed by Dr. Erling Cruz  ? Depression, neurotic 11/21/2013  ? Diabetes mellitus   ? on insulin  ? Enthesopathy of ankle and tarsus 09/04/2007  ? Overview:  Metatarsalgia   ? Enthesopathy of ankle and tarsus 09/04/2007  ? Overview:  Metatarsalgia  Formatting of this note might be different from the original. Metatarsalgia  10/1 IMO update  ? History of renal calculi   ? Hyperlipidemia   ? Hypertension   ? Ischemic heart disease   ? prior PCI to RCA in 1989. S/P PCI to LAD and OM in 1992. S/P PCI to first  DX in 2000. S/P CABG x 3 in May 2011  ? Left hip pain 01/03/2020  ? OSA (obstructive sleep apnea) 01/11/2018  ? AHI 18.1 and SaO2 low 73%  ? PAD (peripheral artery disease) ([place]) 11/18/2017  ? ABIs/Arterial US 04/2019: R 1.21; L 0.66 // R SFA 30-49, stable > 50 CIA and EIA stenosis; L > 50 CIA stenosis (likely represents severe stenosis or short segment occlusion)  ? Peripheral neuropathy   ? Pneumonia 02/2011  ? Sacroiliac joint dysfunction of left side 01/03/2020  ? Stroke ([place]) 04/16/2001  ? small right cerebellar infarct on 04/16/2001 at that time he was found to have proximal left vertebral artery, proximal left common carotid artery and both external carotid artery stenosis as well as intracranial stenosis involving mid basilar artery- 07/2011 add questionable TIA  ? ? ?Past Surgical History:  ?Procedure Laterality Date  ?  NASAL ENDOSCOPY  01/11/2020  ? chronic rhinitis w/o evidence of acute sinusitis, bilateral inferior turbinate hypertrophy  ? ABDOMINAL AORTOGRAM W/LOWER EXTREMITY Bilateral 05/30/2019  ? Procedure: ABDOMINAL AORTOGRAM W/LOWER EXTREMITY;  Surgeon: Wellington Hampshire, MD;  Location: [place] [facility];  Service: Cardiovascular;  Laterality: Bilateral;  ? ANGIOPLASTY  1989  ? right coronary artery  ? ANGIOPLASTY  1992  ? LAD and OM  ? ANGIOPLASTY  1998  ? First DX  ? BRAIN SURGERY    ? on prior records  ? CORONARY ARTERY BYPASS GRAFT  11/12/2009  ? LIMA to LAD, SVG to OM and  SVG to RCA  ? CORONARY STENT PLACEMENT  2000  ? Stent to LAD/Circumflex with angioplasty to first diagonal   ? LEFT HEART CATH AND CORS/GRAFTS ANGIOGRAPHY N/A 10/13/2017  ? Procedure: LEFT HEART CATH AND CORS/GRAFTS ANGIOGRAPHY;  Surgeon: Nelva Bush, MD;  Location: [place] [facility];  Service: Cardiovascular;  Laterality: N/A;  ? ? ?Allergies: ?Codeine, Lisinopril, Nsaids, and Latex ? ?Medications: ?Prior to Admission medications   ?Medication Sig Start Date End Date Taking? Authorizing Provider  ?ascorbic acid (VITAMIN  C) 500 MG tablet Take by mouth.    [provider]  ?BD INSULIN SYRINGE U/F 31G X 5/16" 0.5 ML MISC E.11.9- monitor blood glucose tid for IDDM 07/15/21   Kuneff, Renee A, DO  ?blood glucose meter kit and supplies KIT Dispense based on patient and insurance preference. Use up to four times daily as directed. 07/15/21   Howard Pouch A, DO  ?Carboxymethylcell-Hypromellose (GENTEAL OP) Place 1 drop into both eyes daily as needed (dry eyes).    [provider]  ?cholecalciferol (VITAMIN D3) 25 MCG (1000 UNIT) tablet Take 1,000 Units by mouth daily.    [provider]  ?co-enzyme Q-10 30 MG capsule Take 30 mg by mouth daily.    [provider]  ?dofetilide (TIKOSYN) 125 MCG capsule TAKE 1 CAPSULE BY MOUTH TWICE A DAY 04/01/21   [name], Wonda Cheng, MD  ?Arne Cleveland 5 MG TABS tablet TAKE 1 TABLET BY MOUTH TWICE A DAY 08/31/21   [name], Wonda Cheng, MD  ?ezetimibe (ZETIA) 10 MG tablet Take 1 tablet (10 mg total) by mouth daily. 10/23/20   Kuneff, Renee A, DO  ?ferrous sulfate 325 (65 FE) MG tablet Take 1 tablet (325 mg total) by mouth every other day. 10/24/20   Milus Banister, MD  ?fluticasone Asencion Islam) 50 MCG/ACT nasal spray Place 2 sprays into both nostrils daily. 01/01/20   [provider]  ?insulin glargine (LANTUS) 100 UNIT/ML injection 30-35 units subcu injection in the morning and 35 units subcu injection before bed. 07/15/21   Kuneff, Renee A, DO  ?Lancets (ONETOUCH DELICA PLUS [id number]) MISC ONETOUCH VERIO LANCETS USE TO TEST YOUR BLOOD SUGAR TWICE A DAY FINGER STICK 02/20/19   [provider]  ?loratadine (CLARITIN) 10 MG tablet Take 10 mg by mouth daily as needed for allergies.    [provider]  ?Magnesium 250 MG TABS Take by mouth.    [provider]  ?metoprolol tartrate (LOPRESSOR) 25 MG tablet Take 0.5 tablets (12.5 mg total) by mouth 2 (two) times daily. 04/27/21   [name], Wonda Cheng, MD  ?nitroGLYCERIN (NITROSTAT) 0.4 MG SL tablet PLACE 1 TABLET (0.4 MG  TOTAL) UNDER THE TONGUE EVERY 5 (FIVE) MINUTES AS NEEDED FOR CHEST PAIN. 04/23/19   Richardson Dopp T, PA-C  ?olmesartan (BENICAR) 40 MG tablet Take 40 mg by mouth daily. 06/10/21   [provider]  ?Roma Schanz test strip USE UP TO FOUR TIMES DAILY AS DIRECTED. 09/07/21   McGowen, Adrian Blackwater, MD  ?polyethylene glycol powder (GLYCOLAX/MIRALAX) 17 GM/SCOOP powder Take by mouth.    [provider]  ?rosuvastatin (CRESTOR) 20 MG tablet Take 1 tablet (20 mg total) by mouth at bedtime. 10/23/20   Kuneff, Renee A, DO  ?Semaglutide (RYBELSUS) 7 MG TABS Take 1 tablet by mouth daily with breakfast. 07/15/21   Raoul Pitch, Renee A, DO  ?  ? ?Family History  ?Problem Relation Age of Onset  ? Depression Mother   ? Early death Mother   ?  Kidney disease Mother   ? Ovarian cancer Mother   ? Hypertension Father   ? Heart disease Father   ? Heart attack Father   ? Asthma Brother   ? Diabetes Brother   ? Stroke Other   ?     Uncle  ? Diabetes Sister   ? Colon cancer Neg Hx   ? Rectal cancer Neg Hx   ? Stomach cancer Neg Hx   ? Esophageal cancer Neg Hx   ? ? ?Social History  ? ?Socioeconomic History  ? Marital status: Divorced  ?  Spouse name: Not on file  ? Number of children: 0  ? Years of education: GED  ? Highest education level: Not on file  ?Occupational History  ? Occupation: retired  ?Tobacco Use  ? Smoking status: Some Days  ?  Packs/day: 0.25  ?  Years: 54.00  ?  Pack years: 13.50  ?  Types: Cigarettes  ?  Last attempt to quit: 2022  ?  Years since quitting: 1.2  ? Smokeless tobacco: Never  ? Tobacco comments:  ?  Smokes occasionally   ?Vaping Use  ? Vaping Use: Never used  ?Substance and Sexual Activity  ? Alcohol use: Never  ? Drug use: No  ? Sexual activity: Not Currently  ?  Partners: Female  ?Other Topics Concern  ? Not on file  ?Social History Narrative  ? Marital status/children/pets: divorced.  ? Education/employment: retired, Patient has his GED. 9th grade education.   ? Safety:   ?   -smoke alarm in the  home:Yes  ?   - wears seatbelt: Yes  ? Patient is right handed.  ? Patient does not drink any caffeine.  ? Lives in a one story home   ? ?Social Determinants of Health  ? ?Financial Resource Strain: Lo

## 2021-09-21 ENCOUNTER — Telehealth: Payer: Self-pay | Admitting: Neurology

## 2021-09-21 ENCOUNTER — Encounter (HOSPITAL_COMMUNITY): Payer: Self-pay

## 2021-09-21 ENCOUNTER — Other Ambulatory Visit: Payer: Self-pay

## 2021-09-21 ENCOUNTER — Ambulatory Visit (HOSPITAL_COMMUNITY)
Admission: RE | Admit: 2021-09-21 | Discharge: 2021-09-21 | Disposition: A | Discharge status: Home / Self Care | Payer: Medicare Other | Source: Ambulatory Visit | Attending: Family | Admitting: Family

## 2021-09-21 DIAGNOSIS — I671 Cerebral aneurysm, nonruptured: Secondary | ICD-10-CM

## 2021-09-21 DIAGNOSIS — D8989 Other specified disorders involving the immune mechanism, not elsewhere classified: Secondary | ICD-10-CM | POA: Insufficient documentation

## 2021-09-21 DIAGNOSIS — I6529 Occlusion and stenosis of unspecified carotid artery: Secondary | ICD-10-CM

## 2021-09-21 LAB — GLUCOSE, CAPILLARY: Glucose-Capillary: 90 mg/dL (ref 70–99)

## 2021-09-21 MED ORDER — SODIUM CHLORIDE 0.9 % IV SOLN: INTRAVENOUS | Status: DC

## 2021-09-21 MED ORDER — DIAZEPAM 5 MG PO TABS: 10.0000 mg | ORAL_TABLET | Freq: Once | ORAL | Status: DC

## 2021-09-21 NOTE — Addendum Note (Signed)
Addended by: Jake Seats on: 09/21/2021 02:49 PM ? ? Modules accepted: Orders ? ?

## 2021-09-21 NOTE — Telephone Encounter (Signed)
Patient said he had an MRI Friday and would like the results ?

## 2021-09-21 NOTE — Telephone Encounter (Signed)
Spoke to patient about MRI/MRA brain results. No acute changes. The MRA had showed unchanged severe proximal basilar artery stenosis, moderate distal right vertebral artery stenosis, there was progressive moderate/severe stenosis within the distal cavernous/paraclinoid right ICA, moderate severe stenosis within left PCA branch at P2/P3 junction. There were also 3 small aneurysms seen, 2 were unchanged from prior can. ? ?He states that he has continued to have the episodes of lightheadedness, difficulty focusing, he has checked his BP and he would not be hypotensive. He is on Eliquis, lipid panel normal. Plavix was stopped previously due to GI bleed. Discussed that he is on maximal medical management with continued symptoms, would recommend evaluation again by Neurointerventional Radiologist Dr. Estanislado Pandy. He agrees with plan ? ?Heather, Pls refer to Dr. Estanislado Pandy for intracranial stenosis and cerebral aneurysms, thanks ?

## 2021-09-24 ENCOUNTER — Other Ambulatory Visit: Payer: Self-pay | Admitting: *Deleted

## 2021-09-24 NOTE — Patient Instructions (Addendum)
Visit Information ? ?Thank you for taking time to visit with me today. Please don't hesitate to contact me if I can be of assistance to you before our next scheduled telephone appointment. ? ?Following are the goals we discussed today:  ?Patient Goals/Self-Care Activities: ?Take all medications as prescribed ?Attend all scheduled provider appointments ?Call provider office for new concerns or questions  ?keep appointment with eye doctor ?check blood sugar at prescribed times: twice daily ?check feet daily for cuts, sores or redness ?enter blood sugar readings and medication or insulin into daily log ?take the blood sugar log to all doctor visits ?set goal weight ?trim toenails straight across ?drink 6 to 8 glasses of water each day ?fill half of plate with vegetables ?limit fast food meals to no more than 1 per week ?manage portion size ?prepare main meal at home 3 to 5 days each week ?wear comfortable, well-fitting shoes ?Continue to stay physically active by maintaining your home, yard work, chair exercises ?Continue to limit sugar, carbohydrates, and sodium in your diet ?Eat a before bedtime snack to prevent low blood sugar ? ?Patient verbalizes understanding of instructions and care plan provided today and agrees to view in [place]. Active MyChart status confirmed with patient.   ? ?Telephone follow up appointment with care management team member scheduled for: May ? ?Emelia Loron RN, BSN ?Nurse Health Coach ?[place] ?[phone] ?[email or web address] ? ? ?  ?

## 2021-09-24 NOTE — Patient Outreach (Signed)
[facility]) Care Management ? ?09/24/2021 ? ?[name] ?26-Feb-1946 ?[id number] ? ?[facility]) Care Management ?RN Health Coach Note ? ? ?09/24/2021 ?Name:  Marvin Salinas MRN:  [id number] DOB:  1946-04-12 ? ?Summary: ?Patient states that he will have a bone marrow biopsy on 10/05/21 to follow up on high SPEP; he is being followed by oncology. Patient reports that his A1c increased to 8.4 and he is currently working towards lowering his value. He reports taking his blood sugar 2 times a day with FBS ranges 66-120; nurse provided hypoglycemia education and discussed eating a HS snack. Patient's evening blood sugar ranges are 140-170. Patient explains he is limiting sugar and carbohydrates in his diet. Pain has caused him to decreased the amount of yard work he typically does but continues to stay as active as tolerated. He is taking Tylenol. Patient states his home environment is safe and reports he has all of the essentials he needs currently. Patient did not have any further questions or concerns today and did confirm that he has this nurse's contact number to call her if needed.  ? ?Recommendations/Changes made from today's visit: ?Call provider office for new concerns or questions  ?keep appointment with eye doctor ?check blood sugar at prescribed times: twice daily ?Continue to stay physically active by maintaining your home, yard work, chair exercises ?Continue to limit sugar, carbohydrates, and sodium in your diet ?Eat a HS snack to prevent hypoglycemia ? ?Subjective: ?Marvin Salinas is an 76 y.o. year old male who is a primary patient of Kuneff, Renee A, DO. The care management team was consulted for assistance with care management and/or care coordination needs.   ? ?RN Health Coach completed Telephone Visit today.  ? ?Objective: ? ?Medications Reviewed Today   ? ? Reviewed by Michiel Cowboy, RN (Registered Nurse) on 09/24/21 at 1418  Med List Status: <None>  ? ?Medication Order  Taking? Sig Documenting Provider Last Dose Status Informant  ?ascorbic acid (VITAMIN C) 500 MG tablet [id number] Yes Take by mouth. [provider] Taking Active   ?BD INSULIN SYRINGE U/F 31G X 5/16" 0.5 ML MISC [id number] Yes E.11.9- monitor blood glucose tid for IDDM Kuneff, Renee A, DO Taking Active   ?blood glucose meter kit and supplies KIT [id number] Yes Dispense based on patient and insurance preference. Use up to four times daily as directed. Kuneff, Renee A, DO Taking Active   ?Carboxymethylcell-Hypromellose (GENTEAL OP) [id number] Yes Place 1 drop into both eyes daily as needed (dry eyes). [provider] Taking Active Self  ?cholecalciferol (VITAMIN D3) 25 MCG (1000 UNIT) tablet [id number] Yes Take 1,000 Units by mouth daily. [provider] Taking Active Self  ?co-enzyme Q-10 30 MG capsule [id number] Yes Take 30 mg by mouth daily. [provider] Taking Active Self  ?dofetilide (TIKOSYN) 125 MCG capsule [id number] Yes TAKE 1 CAPSULE BY MOUTH TWICE A DAY [name], Wonda Cheng, MD Taking Active   ?ELIQUIS 5 MG TABS tablet [id number] Yes TAKE 1 TABLET BY MOUTH TWICE A DAY [name], Wonda Cheng, MD Taking Active   ?ezetimibe (ZETIA) 10 MG tablet [id number] Yes Take 1 tablet (10 mg total) by mouth daily. Kuneff, Renee A, DO Taking Active   ?ferrous sulfate 325 (65 FE) MG tablet [id number] Yes Take 1 tablet (325 mg total) by mouth every other day. Milus Banister, MD Taking Active   ?fluticasone [facility]) 50 MCG/ACT nasal spray [id number] Yes Place 2 sprays into both nostrils daily. [provider] Taking Active   ?insulin glargine (LANTUS) 100 UNIT/ML injection [id number] Yes 30-35 units subcu injection in the morning and 35 units subcu injection before bed. Kuneff, Renee A, DO Taking Active   ?         ?Med Note Alena Bills   Mon Sep 21, 2021  9:45 AM) 25 UNITS  ?Lancets (ONETOUCH DELICA PLUS [id number]) [place] [id number] Yes ONETOUCH VERIO LANCETS USE TO TEST YOUR BLOOD SUGAR TWICE A  DAY FINGER STICK [provider] Taking Active Self  ?loratadine (CLARITIN) 10 MG tablet [id number] Yes Take 10 mg by mouth daily as needed for allergies. [provider] Taking Active Self  ?Magnesium 250 MG TABS [id number] Yes Take by mouth. [provider] Taking Active   ?metoprolol tartrate (LOPRESSOR) 25 MG tablet [id number] Yes Take 0.5 tablets (12.5 mg total) by mouth 2 (two) times daily. [name], Wonda Cheng, MD Taking Active   ?nitroGLYCERIN (NITROSTAT) 0.4 MG SL tablet [id number] Yes PLACE 1 TABLET (0.4 MG TOTAL) UNDER THE TONGUE EVERY 5 (FIVE) MINUTES AS NEEDED FOR CHEST PAIN. Liliane Shi, PA-C Taking Active   ?         ?Med Note Benita Stabile Mar 02, 2021  8:04 AM)    ?olmesartan (BENICAR) 40 MG tablet [id number] Yes Take 40 mg by mouth daily. [provider] Taking Active   ?         ?Med Note Benita Stabile Aug 31, 2021  9:29 AM)    ?ONETOUCH VERIO test strip [id number] Yes USE UP TO FOUR TIMES DAILY AS DIRECTED. Tammi Sou, MD Taking Active   ?polyethylene glycol powder (GLYCOLAX/MIRALAX) 17 GM/SCOOP powder [id number] Yes Take by mouth. [provider] Taking Active   ?rosuvastatin (CRESTOR) 20 MG tablet [id number] Yes Take 1 tablet (20 mg total) by mouth at bedtime. Kuneff, Renee A, DO Taking Active   ?Semaglutide (RYBELSUS) 7 MG TABS [id number] Yes Take 1 tablet by mouth daily with breakfast. Raoul Pitch, Renee A, DO Taking Active   ? ?  ?  ? ?  ? ? ? ?SDOH:  (Social Determinants of Health) assessments and interventions performed: SDOH assessments completed today and documented in the Epic system. ? ?Care Plan ? ?Review of patient past medical history, allergies, medications, health status, including review of consultants reports, laboratory and other test data, was performed as part of comprehensive evaluation for care management services.  ? ?Care Plan : RN Care Manager Plan of Care  ?Updates made by Michiel Cowboy, RN since 09/24/2021 12:00 AM  ?   ? ?Problem: Knowledge Deficit Related to  Diabetes   ?Priority: High  ?  ? ?Long-Range Goal: Development of Plan of Care for Management of Diabetes   ?Start Date: 06/29/2021  ?Expected End Date: 07/20/2021  ?Priority: High  ?Note:   ?Current Barriers:  ?Chronic Disease Management support and education needs related to DMII  ? ?RNCM Clinical Goal(s):  ?Patient will demonstrate Ongoing adherence to prescribed treatment plan for DMII as evidenced by maintaining A1c <7  through collaboration with RN Care manager, provider, and care team.  ? ?Interventions: ?Inter-disciplinary care team collaboration (see longitudinal plan of care) ?Evaluation of current treatment plan related to  self management and patient's adherence to plan as established by provider ? ? ?Diabetes Interventions:  (Status:  Goal on track:  NO.) Long Term Goal ?Assessed patient's understanding of A1c goal: <7% ?Provided education to patient about basic DM disease process ?Reviewed medications  with patient and discussed importance of medication adherence ?Counseled on importance of regular laboratory monitoring as prescribed ?Discussed plans with patient for ongoing care management follow up and provided patient with direct contact information for care management team ?Provided patient with written educational materials related to hypo and hyperglycemia and importance of correct treatment ?Advised patient, providing education and rationale, to check cbg 2 and record, calling PCP for findings outside established parameters ?Review of patient status, including review of consultants reports, relevant laboratory and other test results, and medications completed ?Assessed social determinant of health barriers ?Encouraged continuation of a eating a heart healthy diabetic diet ?Encouraged continuation of staying active by maintaining his home, yard, and doing chair exercises ?Lab Results  ?Component Value Date  ? HGBA1C 8.4 (A) 07/15/2021  ? HGBA1C 8.4  07/15/2021  ? HGBA1C 8.4 (A) 07/15/2021  ? HGBA1C 8.4 (A) 07/15/2021  ?Patient Goals/Self-Care Activities: ?Take all medications as prescribed ?Attend all scheduled provider appointments ?Call provider office f

## 2021-09-26 ENCOUNTER — Other Ambulatory Visit: Payer: Self-pay | Admitting: Cardiovascular Disease

## 2021-10-01 ENCOUNTER — Other Ambulatory Visit: Payer: Self-pay

## 2021-10-01 MED ORDER — EZETIMIBE 10 MG PO TABS: 10.0000 mg | ORAL_TABLET | Freq: Every day | ORAL | 0 refills | Status: DC

## 2021-10-02 ENCOUNTER — Other Ambulatory Visit: Payer: Self-pay | Admitting: Radiology

## 2021-10-05 ENCOUNTER — Ambulatory Visit (HOSPITAL_COMMUNITY)
Admission: RE | Admit: 2021-10-05 | Discharge: 2021-10-05 | Disposition: A | Discharge status: Home / Self Care | Payer: Medicare Other | Source: Ambulatory Visit | Attending: Family | Admitting: Family

## 2021-10-05 ENCOUNTER — Other Ambulatory Visit: Payer: Self-pay | Admitting: Family Medicine

## 2021-10-05 ENCOUNTER — Encounter (HOSPITAL_COMMUNITY): Payer: Self-pay

## 2021-10-05 ENCOUNTER — Other Ambulatory Visit: Payer: Self-pay

## 2021-10-05 DIAGNOSIS — E785 Hyperlipidemia, unspecified: Secondary | ICD-10-CM | POA: Insufficient documentation

## 2021-10-05 DIAGNOSIS — I739 Peripheral vascular disease, unspecified: Secondary | ICD-10-CM | POA: Diagnosis not present

## 2021-10-05 DIAGNOSIS — G4733 Obstructive sleep apnea (adult) (pediatric): Secondary | ICD-10-CM | POA: Diagnosis not present

## 2021-10-05 DIAGNOSIS — E1151 Type 2 diabetes mellitus with diabetic peripheral angiopathy without gangrene: Secondary | ICD-10-CM | POA: Diagnosis not present

## 2021-10-05 DIAGNOSIS — I2510 Atherosclerotic heart disease of native coronary artery without angina pectoris: Secondary | ICD-10-CM | POA: Insufficient documentation

## 2021-10-05 DIAGNOSIS — I651 Occlusion and stenosis of basilar artery: Secondary | ICD-10-CM | POA: Insufficient documentation

## 2021-10-05 DIAGNOSIS — I259 Chronic ischemic heart disease, unspecified: Secondary | ICD-10-CM | POA: Insufficient documentation

## 2021-10-05 DIAGNOSIS — F32A Depression, unspecified: Secondary | ICD-10-CM | POA: Insufficient documentation

## 2021-10-05 DIAGNOSIS — M549 Dorsalgia, unspecified: Secondary | ICD-10-CM | POA: Insufficient documentation

## 2021-10-05 DIAGNOSIS — D72829 Elevated white blood cell count, unspecified: Secondary | ICD-10-CM | POA: Diagnosis not present

## 2021-10-05 DIAGNOSIS — Z7902 Long term (current) use of antithrombotics/antiplatelets: Secondary | ICD-10-CM | POA: Insufficient documentation

## 2021-10-05 DIAGNOSIS — G629 Polyneuropathy, unspecified: Secondary | ICD-10-CM | POA: Diagnosis not present

## 2021-10-05 DIAGNOSIS — I4891 Unspecified atrial fibrillation: Secondary | ICD-10-CM | POA: Diagnosis not present

## 2021-10-05 DIAGNOSIS — Z8673 Personal history of transient ischemic attack (TIA), and cerebral infarction without residual deficits: Secondary | ICD-10-CM | POA: Diagnosis not present

## 2021-10-05 DIAGNOSIS — E8581 Light chain (AL) amyloidosis: Secondary | ICD-10-CM | POA: Insufficient documentation

## 2021-10-05 DIAGNOSIS — N189 Chronic kidney disease, unspecified: Secondary | ICD-10-CM | POA: Diagnosis not present

## 2021-10-05 DIAGNOSIS — N400 Enlarged prostate without lower urinary tract symptoms: Secondary | ICD-10-CM | POA: Diagnosis not present

## 2021-10-05 DIAGNOSIS — E1142 Type 2 diabetes mellitus with diabetic polyneuropathy: Secondary | ICD-10-CM | POA: Diagnosis not present

## 2021-10-05 DIAGNOSIS — C9000 Multiple myeloma not having achieved remission: Secondary | ICD-10-CM | POA: Diagnosis not present

## 2021-10-05 DIAGNOSIS — E1122 Type 2 diabetes mellitus with diabetic chronic kidney disease: Secondary | ICD-10-CM | POA: Insufficient documentation

## 2021-10-05 DIAGNOSIS — I252 Old myocardial infarction: Secondary | ICD-10-CM | POA: Diagnosis not present

## 2021-10-05 DIAGNOSIS — D4989 Neoplasm of unspecified behavior of other specified sites: Secondary | ICD-10-CM | POA: Diagnosis not present

## 2021-10-05 DIAGNOSIS — Z794 Long term (current) use of insulin: Secondary | ICD-10-CM | POA: Insufficient documentation

## 2021-10-05 DIAGNOSIS — I129 Hypertensive chronic kidney disease with stage 1 through stage 4 chronic kidney disease, or unspecified chronic kidney disease: Secondary | ICD-10-CM | POA: Insufficient documentation

## 2021-10-05 LAB — CBC WITH DIFFERENTIAL/PLATELET
Abs Immature Granulocytes: 0.04 10*3/uL (ref 0.00–0.07)
Basophils Absolute: 0.1 10*3/uL (ref 0.0–0.1)
Basophils Relative: 1 %
Eosinophils Absolute: 0.3 10*3/uL (ref 0.0–0.5)
Eosinophils Relative: 2 %
HCT: 48.8 % (ref 39.0–52.0)
Hemoglobin: 16.8 g/dL (ref 13.0–17.0)
Immature Granulocytes: 0 %
Lymphocytes Relative: 16 %
Lymphs Abs: 1.9 10*3/uL (ref 0.7–4.0)
MCH: 31.3 pg (ref 26.0–34.0)
MCHC: 34.4 g/dL (ref 30.0–36.0)
MCV: 90.9 fL (ref 80.0–100.0)
Monocytes Absolute: 0.8 10*3/uL (ref 0.1–1.0)
Monocytes Relative: 7 %
Neutro Abs: 8.8 10*3/uL — ABNORMAL HIGH (ref 1.7–7.7)
Neutrophils Relative %: 74 %
Platelets: 243 10*3/uL (ref 150–400)
RBC: 5.37 MIL/uL (ref 4.22–5.81)
RDW: 12.9 % (ref 11.5–15.5)
WBC: 11.8 10*3/uL — ABNORMAL HIGH (ref 4.0–10.5)
nRBC: 0 % (ref 0.0–0.2)

## 2021-10-05 LAB — GLUCOSE, CAPILLARY: Glucose-Capillary: 86 mg/dL (ref 70–99)

## 2021-10-05 MED ORDER — MIDAZOLAM HCL 2 MG/2ML IJ SOLN
INTRAMUSCULAR | Status: AC | PRN
  Administered 2021-10-05: 1 mg via INTRAVENOUS

## 2021-10-05 MED ORDER — FLUMAZENIL 0.5 MG/5ML IV SOLN
INTRAVENOUS | Status: AC
  Filled 2021-10-05: qty 5

## 2021-10-05 MED ORDER — MIDAZOLAM HCL 2 MG/2ML IJ SOLN
INTRAMUSCULAR | Status: AC
  Filled 2021-10-05: qty 2

## 2021-10-05 MED ORDER — FENTANYL CITRATE (PF) 100 MCG/2ML IJ SOLN
INTRAMUSCULAR | Status: AC | PRN
  Administered 2021-10-05: 50 ug via INTRAVENOUS

## 2021-10-05 MED ORDER — FENTANYL CITRATE (PF) 100 MCG/2ML IJ SOLN
INTRAMUSCULAR | Status: AC
  Filled 2021-10-05: qty 2

## 2021-10-05 MED ORDER — FENTANYL CITRATE (PF) 100 MCG/2ML IJ SOLN
INTRAMUSCULAR | Status: DC | PRN
  Administered 2021-10-05: 50 ug via INTRAVENOUS

## 2021-10-05 MED ORDER — HYDROCODONE-ACETAMINOPHEN 5-325 MG PO TABS: 1.0000 | ORAL_TABLET | ORAL | Status: DC | PRN

## 2021-10-05 MED ORDER — SODIUM CHLORIDE 0.9 % IV SOLN: INTRAVENOUS | Status: DC

## 2021-10-05 MED ORDER — LIDOCAINE HCL (PF) 1 % IJ SOLN
INTRAMUSCULAR | Status: AC | PRN
  Administered 2021-10-05: 10 mL via INTRADERMAL

## 2021-10-05 MED ORDER — MIDAZOLAM HCL 2 MG/2ML IJ SOLN
INTRAMUSCULAR | Status: DC | PRN
  Administered 2021-10-05: 1 mg via INTRAVENOUS

## 2021-10-05 MED ORDER — NALOXONE HCL 0.4 MG/ML IJ SOLN
INTRAMUSCULAR | Status: AC
  Filled 2021-10-05: qty 1

## 2021-10-05 NOTE — Procedures (Signed)
?  Procedure:  CT bone marrow biopsy R iliac ?Preprocedure diagnosis: Elev lambda light chains ? ?Postprocedure diagnosis: same ?EBL:    minimal ?Complications:   none immediate ? ?See full dictation in [facility]. ? ?D. Arne Cleveland MD ?Main # [phone] ?Pager  [phone] ?Mobile [phone] ?  ? ?

## 2021-10-05 NOTE — H&P (Signed)
? ? ?Referring Physician(s): ?Celso Amy  NP ? ?Supervising Physician: Arne Cleveland ? ?Patient Status:  [facility] OP ? ?Chief Complaint: ?"I'm having a bone marrow biopsy" ? ? ?Subjective: ?Pt known to radiology service from cerebral arteriograms in 2002, 2003. He has a PMH sig for CKD, afib, basilar artery stenosis, BPH, carotis artery disease, depression, diabetes, for lithiasis, hypertension, hyperlipidemia, ischemic heart disease, struct of sleep apnea, peripheral arterial disease, prior stroke, and now with elevated lambda light chains of uncertain etiology.  He is scheduled today for CT-guided bone marrow biopsy for further evaluation.  He currently denies fever, chest pain, dyspnea, abdominal pain, nausea, vomiting or bleeding.  He does have occasional headaches, occasional dizzy spells, occasional visual disturbances, peripheral neuropathy, and back pain. ? ?Past Medical History:  ?Diagnosis Date  ? Acute kidney injury superimposed on chronic kidney disease ([place]) 10/12/2017  ? Atrial fibrillation with RVR ([place]) 10/27/2017  ? Back pain   ? Basilar artery stenosis   ? on chronic Plavix  ? Benign prostatic hypertrophy without urinary obstruction 04/17/2014  ? Carotid artery disease ([place]) 10/06/2010  ? Carotid US 04/2019: Bilat ICA 40-59; L subclavian stenosis  // Carotid US 11/21: Bilat ICA 40-59; bilateral subclavian stenosis // Carotid US 11/22: Bilateral ICA 40-59; left subclavian stenosis  ? Cerebral vascular disease   ? with prior TIA's; followed by Dr. Erling Cruz  ? Depression, neurotic 11/21/2013  ? Diabetes mellitus   ? on insulin  ? Enthesopathy of ankle and tarsus 09/04/2007  ? Overview:  Metatarsalgia   ? Enthesopathy of ankle and tarsus 09/04/2007  ? Overview:  Metatarsalgia  Formatting of this note might be different from the original. Metatarsalgia  10/1 IMO update  ? History of renal calculi   ? Hyperlipidemia   ? Hypertension   ? Ischemic heart disease   ? prior PCI to RCA in 1989. S/P PCI to LAD and  OM in 1992. S/P PCI to first DX in 2000. S/P CABG x 3 in May 2011  ? Left hip pain 01/03/2020  ? OSA (obstructive sleep apnea) 01/11/2018  ? AHI 18.1 and SaO2 low 73%  ? PAD (peripheral artery disease) ([place]) 11/18/2017  ? ABIs/Arterial US 04/2019: R 1.21; L 0.66 // R SFA 30-49, stable > 50 CIA and EIA stenosis; L > 50 CIA stenosis (likely represents severe stenosis or short segment occlusion)  ? Peripheral neuropathy   ? Pneumonia 02/2011  ? Sacroiliac joint dysfunction of left side 01/03/2020  ? Stroke ([place]) 04/16/2001  ? small right cerebellar infarct on 04/16/2001 at that time he was found to have proximal left vertebral artery, proximal left common carotid artery and both external carotid artery stenosis as well as intracranial stenosis involving mid basilar artery- 07/2011 add questionable TIA  ? ?Past Surgical History:  ?Procedure Laterality Date  ?  NASAL ENDOSCOPY  01/11/2020  ? chronic rhinitis w/o evidence of acute sinusitis, bilateral inferior turbinate hypertrophy  ? ABDOMINAL AORTOGRAM W/LOWER EXTREMITY Bilateral 05/30/2019  ? Procedure: ABDOMINAL AORTOGRAM W/LOWER EXTREMITY;  Surgeon: Wellington Hampshire, MD;  Location: [facility];  Service: Cardiovascular;  Laterality: Bilateral;  ? ANGIOPLASTY  1989  ? right coronary artery  ? ANGIOPLASTY  1992  ? LAD and OM  ? ANGIOPLASTY  1998  ? First DX  ? BRAIN SURGERY    ? on prior records  ? CORONARY ARTERY BYPASS GRAFT  11/12/2009  ? LIMA to LAD, SVG to OM and SVG to RCA  ? CORONARY STENT PLACEMENT  2000  ? Stent to LAD/Circumflex with angioplasty to first diagonal   ? LEFT HEART CATH AND CORS/GRAFTS ANGIOGRAPHY N/A 10/13/2017  ? Procedure: LEFT HEART CATH AND CORS/GRAFTS ANGIOGRAPHY;  Surgeon: Nelva Bush, MD;  Location: [facility];  Service: Cardiovascular;  Laterality: N/A;  ? ? ? ? ? ?Allergies: ?Codeine, Lisinopril, Nsaids, and Latex ? ?Medications: ?Prior to Admission medications   ?Medication Sig Start Date End Date Taking? Authorizing  Provider  ?ascorbic acid (VITAMIN C) 500 MG tablet Take by mouth.   Yes [provider]  ?BD INSULIN SYRINGE U/F 31G X 5/16" 0.5 ML MISC E.11.9- monitor blood glucose tid for IDDM 07/15/21  Yes Kuneff, Renee A, DO  ?blood glucose meter kit and supplies KIT Dispense based on patient and insurance preference. Use up to four times daily as directed. 07/15/21  Yes Kuneff, Renee A, DO  ?Carboxymethylcell-Hypromellose (GENTEAL OP) Place 1 drop into both eyes daily as needed (dry eyes).   Yes [provider]  ?cholecalciferol (VITAMIN D3) 25 MCG (1000 UNIT) tablet Take 1,000 Units by mouth daily.   Yes [provider]  ?co-enzyme Q-10 30 MG capsule Take 30 mg by mouth daily.   Yes [provider]  ?dofetilide (TIKOSYN) 125 MCG capsule TAKE 1 CAPSULE BY MOUTH TWICE A DAY 09/28/21  Yes [name], Wonda Cheng, MD  ?ezetimibe (ZETIA) 10 MG tablet Take 1 tablet (10 mg total) by mouth daily. 10/01/21  Yes Kuneff, Renee A, DO  ?ferrous sulfate 325 (65 FE) MG tablet Take 1 tablet (325 mg total) by mouth every other day. 10/24/20  Yes Milus Banister, MD  ?fluticasone [facility]) 50 MCG/ACT nasal spray Place 2 sprays into both nostrils daily. 01/01/20  Yes [provider]  ?insulin glargine (LANTUS) 100 UNIT/ML injection 30-35 units subcu injection in the morning and 35 units subcu injection before bed. 07/15/21  Yes Kuneff, Renee A, DO  ?Lancets (ONETOUCH DELICA PLUS [id number]) MISC ONETOUCH VERIO LANCETS USE TO TEST YOUR BLOOD SUGAR TWICE A DAY FINGER STICK 02/20/19  Yes [provider]  ?loratadine (CLARITIN) 10 MG tablet Take 10 mg by mouth daily as needed for allergies.   Yes [provider]  ?Magnesium 250 MG TABS Take by mouth.   Yes [provider]  ?metoprolol tartrate (LOPRESSOR) 25 MG tablet Take 0.5 tablets (12.5 mg total) by mouth 2 (two) times daily. 04/27/21  Yes [name], Wonda Cheng, MD  ?olmesartan (BENICAR) 40 MG tablet Take 40 mg by mouth daily. 06/10/21  Yes  [provider]  ?Roma Schanz test strip USE UP TO FOUR TIMES DAILY AS DIRECTED. 09/07/21  Yes McGowen, Adrian Blackwater, MD  ?polyethylene glycol powder (GLYCOLAX/MIRALAX) 17 GM/SCOOP powder Take by mouth.   Yes [provider]  ?rosuvastatin (CRESTOR) 20 MG tablet Take 1 tablet (20 mg total) by mouth at bedtime. 10/23/20  Yes Kuneff, Renee A, DO  ?Semaglutide (RYBELSUS) 7 MG TABS Take 1 tablet by mouth daily with breakfast. 07/15/21  Yes Kuneff, Renee A, DO  ?ELIQUIS 5 MG TABS tablet TAKE 1 TABLET BY MOUTH TWICE A DAY 08/31/21   [name], Wonda Cheng, MD  ?nitroGLYCERIN (NITROSTAT) 0.4 MG SL tablet PLACE 1 TABLET (0.4 MG TOTAL) UNDER THE TONGUE EVERY 5 (FIVE) MINUTES AS NEEDED FOR CHEST PAIN. 04/23/19   Richardson Dopp T, PA-C  ? ? ? ?Vital Signs: ?BP (!) 148/72   Pulse (!) 55   Temp 97.6 ?F (36.4 ?C) (Oral)   Resp 17   Ht '5\' 8"'  (  1.727 m)   Wt 173 lb (78.5 kg)   SpO2 96%   BMI 26.30 kg/m?  ? ?Physical Exam awake, alert.  Chest clear to auscultation bilaterally.  Heart with sl bradycardic but regular rhythm.  Abdomen soft, positive bowel sounds, nontender.  No lower extremity edema ? ?Imaging: ?No results found. ? ?Labs: ? ?CBC: ?Recent Labs  ?  01/20/21 ?8875 02/04/21 ?7972 08/11/21 ?1434 10/05/21 ?0735  ?WBC 8.5 9.1 10.0 11.8*  ?HGB 15.3 15.4 16.1 16.8  ?HCT 44.5 44.3 46.5 48.8  ?PLT 251.0 231 261 243  ? ? ?COAGS: ?No results for input(s): INR, APTT in the last 8760 hours. ? ?BMP: ?Recent Labs  ?  10/24/20 ?1005 02/04/21 ?0832 08/11/21 ?1434 08/31/21 ?1026  ?NA 141 139 142 140  ?K 4.7 4.6 4.7 4.8  ?CL 105 105 108 104  ?CO2 '28 26 27 24  ' ?GLUCOSE 122* 143* 97 122*  ?BUN 32* 21 30* 21  ?CALCIUM 10.0 9.9 9.1 9.8  ?CREATININE 1.55* 1.35* 1.69* 1.38*  ?GFRNONAA  --  55* 42*  --   ? ? ?LIVER FUNCTION TESTS: ?Recent Labs  ?  02/04/21 ?0832 08/11/21 ?1434 08/31/21 ?1026  ?BILITOT 0.4 0.5  --   ?AST 24 22  --   ?ALT '20 22 20  ' ?ALKPHOS 54 60  --   ?PROT 6.7 6.5  --   ?ALBUMIN 4.3 4.1  --   ? ? ?Assessment and  Plan: ?Pt known to radiology service from cerebral arteriograms in 2002, 2003. He has a PMH sig for CKD, afib, basilar artery stenosis, BPH, carotis artery disease, depression, diabetes, for lithiasis, hypertension, h

## 2021-10-05 NOTE — Discharge Instructions (Signed)

## 2021-10-07 DIAGNOSIS — M5416 Radiculopathy, lumbar region: Secondary | ICD-10-CM | POA: Diagnosis not present

## 2021-10-07 LAB — SURGICAL PATHOLOGY

## 2021-10-12 ENCOUNTER — Telehealth: Payer: Self-pay | Admitting: Family

## 2021-10-12 NOTE — Telephone Encounter (Signed)
Bone marrow biopsy reviewed with MD and let [name] know that it did indicate myeloma. Cytology pending. No questions or concerns at this time. Patient appreciative of call.  ?

## 2021-10-16 ENCOUNTER — Encounter (HOSPITAL_COMMUNITY): Payer: Self-pay | Admitting: Hematology & Oncology

## 2021-10-19 ENCOUNTER — Other Ambulatory Visit: Payer: Self-pay | Admitting: *Deleted

## 2021-10-19 NOTE — Patient Instructions (Signed)
Visit Information ? ?Thank you for taking time to visit with me today. Please don't hesitate to contact me if I can be of assistance to you before our next scheduled telephone appointment. ? ?Following are the goals we discussed today:  ?Take all medications as prescribed ?Attend all scheduled provider appointments ?Call provider office for new concerns or questions  ?keep appointment with eye doctor ?check blood sugar at prescribed times: twice daily ?check feet daily for cuts, sores or redness ?enter blood sugar readings and medication or insulin into daily log ?take the blood sugar log to all doctor visits ?set goal weight ?trim toenails straight across ?drink 6 to 8 glasses of water each day ?fill half of plate with vegetables ?limit fast food meals to no more than 1 per week ?manage portion size ?prepare main meal at home 3 to 5 days each week ?wear comfortable, well-fitting shoes ?Continue to stay physically active by not sitting for long periods and doing chair exercises as tolerated ?Continue to limit sugar, carbohydrates, and sodium in your diet ?Eat a bedtime snack to prevent low blood sugar ? ?The patient verbalized understanding of instructions, educational materials, and care plan provided today and agreed to receive a mailed copy of patient instructions, educational materials, and care plan.  ? ?Telephone follow up appointment with care management team member scheduled for: June ?  ?Emelia Loron RN, BSN ?Nurse Health Coach ?[place] ?[phone] ?[email or web address] ? ? ?  ?

## 2021-10-19 NOTE — Patient Outreach (Addendum)
[facility]) Care Management ? ?10/19/2021 ? ?[name] ?12-29-1945 ?[id number] ? ?[facility]) Care Management ?RN Health Coach Note ? ? ?10/19/2021 ?Name:  Marvin Salinas MRN:  [id number] DOB:  August 21, 1945 ? ?Summary: ?Patient reports that he completed the bone marrow biopsy and the oncologist diagnosed him with Multiple Myeloma. Patient is waiting for the complete cytology to be resulted and a call back from oncology regarding treatment. Patient states that his blood sugar has improved with ranges of 70-160. He continues to work towards lowering his A1c. Patient explains that his home environment is safe and that he is supported by his family. Patient did not have any further questions or concerns today and did confirm that he has this nurse's contact number to call her if needed.  ? ?Recommendations/Changes made from today's visit: ?Continue to stay physically active by not sitting for long periods and doing chair exercises as tolerated ?Continue to limit sugar, carbohydrates, and sodium in your diet ?Eat a bedtime snack to prevent low blood sugar ? ?Subjective: ?Marvin Salinas is an 76 y.o. year old male who is a primary patient of Kuneff, Renee A, DO. The care management team was consulted for assistance with care management and/or care coordination needs.   ? ?RN Health Coach completed Telephone Visit today.  ? ?Objective: ? ?Medications Reviewed Today   ? ? Reviewed by Michiel Cowboy, RN (Registered Nurse) on 10/19/21 at 1017  Med List Status: <None>  ? ?Medication Order Taking? Sig Documenting Provider Last Dose Status Informant  ?ascorbic acid (VITAMIN C) 500 MG tablet [id number] No Take by mouth. [provider] 10/04/2021 Active   ?BD INSULIN SYRINGE U/F 31G X 5/16" 0.5 ML MISC [id number] No E.11.9- monitor blood glucose tid for IDDM Kuneff, Renee A, DO 10/04/2021 Active   ?blood glucose meter kit and supplies KIT [id number] No Dispense based on patient and insurance  preference. Use up to four times daily as directed. Kuneff, Renee A, DO 10/04/2021 Active   ?Carboxymethylcell-Hypromellose (GENTEAL OP) [id number] No Place 1 drop into both eyes daily as needed (dry eyes). [provider] 10/04/2021 Active Self  ?cholecalciferol (VITAMIN D3) 25 MCG (1000 UNIT) tablet [id number] No Take 1,000 Units by mouth daily. [provider] 10/04/2021 Active Self  ?co-enzyme Q-10 30 MG capsule [id number] No Take 30 mg by mouth daily. [provider] 10/04/2021 Active Self  ?dofetilide (TIKOSYN) 125 MCG capsule [id number] No TAKE 1 CAPSULE BY MOUTH TWICE A DAY [name], Wonda Cheng, MD 10/04/2021 Active   ?ELIQUIS 5 MG TABS tablet [id number] No TAKE 1 TABLET BY MOUTH TWICE A DAY [name], Wonda Cheng, MD 10/02/2021 Active   ?ezetimibe (ZETIA) 10 MG tablet [id number] No Take 1 tablet (10 mg total) by mouth daily. Kuneff, Renee A, DO 10/04/2021 Active   ?ferrous sulfate 325 (65 FE) MG tablet [id number] No Take 1 tablet (325 mg total) by mouth every other day. Milus Banister, MD 10/04/2021 Active   ?fluticasone (FLONASE) 50 MCG/ACT nasal spray [id number] No Place 2 sprays into both nostrils daily. [provider] 10/04/2021 Active   ?insulin glargine (LANTUS) 100 UNIT/ML injection [id number] No 30-35 units subcu injection in the morning and 35 units subcu injection before bed. Kuneff, Renee A, DO 10/04/2021 Active   ?         ?Med Note Lowry Bowl Oct 05, 2021  7:42 AM) 25 UNITS  ?Lancets (ONETOUCH DELICA PLUS [id number]) [place] [id number]  USE UP TO  FOUR TIMES DAILY AS DIRECTED **EMERGENCY FILL** Kuneff, Renee A, DO  Active   ?loratadine (CLARITIN) 10 MG tablet [id number] No Take 10 mg by mouth daily as needed for allergies. [provider] 10/04/2021 Active Self  ?Magnesium 250 MG TABS [id number] No Take by mouth. [provider] 10/04/2021 Active   ?metoprolol tartrate (LOPRESSOR) 25 MG tablet [id number] No Take 0.5 tablets (12.5 mg total) by mouth 2 (two) times  daily. [name], Wonda Cheng, MD 10/04/2021 Active   ?nitroGLYCERIN (NITROSTAT) 0.4 MG SL tablet [id number] No PLACE 1 TABLET (0.4 MG TOTAL) UNDER THE TONGUE EVERY 5 (FIVE) MINUTES AS NEEDED FOR CHEST PAIN. Richardson Dopp T, PA-C Unknown Active   ?         ?Med Note Benita Stabile Mar 02, 2021  8:04 AM)    ?olmesartan (BENICAR) 40 MG tablet [id number] No Take 40 mg by mouth daily. [provider] 10/04/2021 Active   ?         ?Med Note Benita Stabile Aug 31, 2021  9:29 AM)    ?ONETOUCH VERIO test strip [id number] No USE UP TO FOUR TIMES DAILY AS DIRECTED. Tammi Sou, MD 10/04/2021 Active   ?polyethylene glycol powder (GLYCOLAX/MIRALAX) 17 GM/SCOOP powder [id number] No Take by mouth. [provider] 10/04/2021 Active   ?rosuvastatin (CRESTOR) 20 MG tablet [id number] No Take 1 tablet (20 mg total) by mouth at bedtime. Kuneff, Renee A, DO 10/04/2021 Active   ?Semaglutide (RYBELSUS) 7 MG TABS [id number] No Take 1 tablet by mouth daily with breakfast. Kuneff, Renee A, DO 10/04/2021 Active   ? ?  ?  ? ?  ? ? ? ?SDOH:  (Social Determinants of Health) assessments and interventions performed: SDOH assessments completed today and documented in the Epic system. ? ?SDOH Interventions   ? ?Flowsheet Row Most Recent Value  ?SDOH Interventions   ?Depression Interventions/Treatment  --  [patient states that he does not like to take medication. Declines grief counseling at this time]  ? ?  ? ? ?Care Plan ? ?Review of patient past medical history, allergies, medications, health status, including review of consultants reports, laboratory and other test data, was performed as part of comprehensive evaluation for care management services.  ? ?Care Plan : RN Care Manager Plan of Care  ?Updates made by Michiel Cowboy, RN since 10/19/2021 12:00 AM  ?  ? ?Problem: Knowledge Deficit Related to  Diabetes   ?Priority: High  ?  ? ?Long-Range Goal: Development of Plan of Care for Management of Diabetes   ?Start Date: 06/29/2021   ?Expected End Date: 07/20/2021  ?Priority: High  ?Note:   ?Current Barriers:  ?Chronic Disease Management support and education needs related to DMII  ? ?RNCM Clinical Goal(s):  ?Patient will demonstrate Ongoing adherence to prescribed treatment plan for DMII as evidenced by decreasing A1c by .2-.3 within the next 90 days  through collaboration with RN Care manager, provider, and care team.  ? ?Interventions: ?Inter-disciplinary care team collaboration (see longitudinal plan of care) ?Evaluation of current treatment plan related to  self management and patient's adherence to plan as established by provider ? ? ?Diabetes Interventions:  (Status:  Goal on track:  Yes.) Long Term Goal ?Assessed patient's understanding of A1c goal: <7% Patient reports his blood sugars are better and he is working towards lowering his A1c ?Provided education to patient about basic DM disease process ?Reviewed medications with patient and discussed importance of medication adherence ?Counseled on importance of  regular laboratory monitoring as prescribed ?Discussed plans with patient for ongoing care management follow up and provided patient with direct contact information for care management team ?Provided patient with written educational materials related to hypo and hyperglycemia and importance of correct treatment ?Advised patient, providing education and rationale, to check cbg 2 and record, calling PCP for findings outside established parameters ?Review of patient status, including review of consultants reports, relevant laboratory and other test results, and medications completed ?Assessed social determinant of health barriers ?Encouraged continuation of a eating a heart healthy diabetic diet ?Encouraged patient not to sit for long periods and to do chair exercises as tolerated ?Discussed continuation of eating a HS snack to prevent hypoglycemia ?Lab Results  ?Component Value Date  ? HGBA1C 8.4 (A) 07/15/2021  ? HGBA1C 8.4  07/15/2021  ? HGBA1C 8.4 (A) 07/15/2021  ? HGBA1C 8.4 (A) 07/15/2021  ?Patient Goals/Self-Care Activities: ?Take all medications as prescribed ?Attend all scheduled provider appointments ?Call provider office for new conc

## 2021-10-24 ENCOUNTER — Other Ambulatory Visit: Payer: Self-pay | Admitting: Family Medicine

## 2021-10-28 ENCOUNTER — Encounter: Payer: Self-pay | Admitting: Family Medicine

## 2021-10-28 ENCOUNTER — Inpatient Hospital Stay: Payer: Medicare Other

## 2021-10-28 ENCOUNTER — Ambulatory Visit (INDEPENDENT_AMBULATORY_CARE_PROVIDER_SITE_OTHER): Payer: Medicare Other | Admitting: Family Medicine

## 2021-10-28 ENCOUNTER — Encounter: Payer: Self-pay | Admitting: Hematology & Oncology

## 2021-10-28 ENCOUNTER — Inpatient Hospital Stay: Payer: Medicare Other | Attending: Family | Admitting: Hematology & Oncology

## 2021-10-28 ENCOUNTER — Other Ambulatory Visit: Payer: Self-pay

## 2021-10-28 ENCOUNTER — Other Ambulatory Visit: Payer: Self-pay | Admitting: Family

## 2021-10-28 VITALS — BP 136/66 | HR 54 | Temp 97.4°F | Ht 68.0 in | Wt 168.0 lb

## 2021-10-28 DIAGNOSIS — E118 Type 2 diabetes mellitus with unspecified complications: Secondary | ICD-10-CM | POA: Diagnosis not present

## 2021-10-28 DIAGNOSIS — Z5112 Encounter for antineoplastic immunotherapy: Secondary | ICD-10-CM | POA: Insufficient documentation

## 2021-10-28 DIAGNOSIS — I10 Essential (primary) hypertension: Secondary | ICD-10-CM | POA: Diagnosis not present

## 2021-10-28 DIAGNOSIS — E785 Hyperlipidemia, unspecified: Secondary | ICD-10-CM

## 2021-10-28 DIAGNOSIS — D500 Iron deficiency anemia secondary to blood loss (chronic): Secondary | ICD-10-CM

## 2021-10-28 DIAGNOSIS — I4891 Unspecified atrial fibrillation: Secondary | ICD-10-CM | POA: Diagnosis not present

## 2021-10-28 DIAGNOSIS — I25810 Atherosclerosis of coronary artery bypass graft(s) without angina pectoris: Secondary | ICD-10-CM

## 2021-10-28 DIAGNOSIS — Z8673 Personal history of transient ischemic attack (TIA), and cerebral infarction without residual deficits: Secondary | ICD-10-CM

## 2021-10-28 DIAGNOSIS — C9000 Multiple myeloma not having achieved remission: Secondary | ICD-10-CM | POA: Insufficient documentation

## 2021-10-28 DIAGNOSIS — Z794 Long term (current) use of insulin: Secondary | ICD-10-CM | POA: Diagnosis not present

## 2021-10-28 DIAGNOSIS — I4892 Unspecified atrial flutter: Secondary | ICD-10-CM | POA: Diagnosis not present

## 2021-10-28 DIAGNOSIS — I739 Peripheral vascular disease, unspecified: Secondary | ICD-10-CM | POA: Diagnosis not present

## 2021-10-28 DIAGNOSIS — Z951 Presence of aortocoronary bypass graft: Secondary | ICD-10-CM | POA: Diagnosis not present

## 2021-10-28 DIAGNOSIS — Z79899 Other long term (current) drug therapy: Secondary | ICD-10-CM | POA: Diagnosis not present

## 2021-10-28 DIAGNOSIS — R778 Other specified abnormalities of plasma proteins: Secondary | ICD-10-CM

## 2021-10-28 DIAGNOSIS — E1149 Type 2 diabetes mellitus with other diabetic neurological complication: Secondary | ICD-10-CM | POA: Diagnosis not present

## 2021-10-28 DIAGNOSIS — N1831 Chronic kidney disease, stage 3a: Secondary | ICD-10-CM

## 2021-10-28 DIAGNOSIS — I779 Disorder of arteries and arterioles, unspecified: Secondary | ICD-10-CM

## 2021-10-28 DIAGNOSIS — I252 Old myocardial infarction: Secondary | ICD-10-CM

## 2021-10-28 HISTORY — DX: Multiple myeloma not having achieved remission: C90.00

## 2021-10-28 LAB — CMP (CANCER CENTER ONLY)
ALT: 36 U/L (ref 0–44)
AST: 23 U/L (ref 15–41)
Albumin: 4.4 g/dL (ref 3.5–5.0)
Alkaline Phosphatase: 54 U/L (ref 38–126)
Anion gap: 8 (ref 5–15)
BUN: 30 mg/dL — ABNORMAL HIGH (ref 8–23)
CO2: 26 mmol/L (ref 22–32)
Calcium: 9.5 mg/dL (ref 8.9–10.3)
Chloride: 106 mmol/L (ref 98–111)
Creatinine: 1.46 mg/dL — ABNORMAL HIGH (ref 0.61–1.24)
GFR, Estimated: 50 mL/min — ABNORMAL LOW (ref >60)
Glucose, Bld: 222 mg/dL — ABNORMAL HIGH (ref 70–99)
Potassium: 4.3 mmol/L (ref 3.5–5.1)
Sodium: 140 mmol/L (ref 135–145)
Total Bilirubin: 0.5 mg/dL (ref 0.3–1.2)
Total Protein: 6.4 g/dL — ABNORMAL LOW (ref 6.5–8.1)

## 2021-10-28 LAB — CBC WITH DIFFERENTIAL (CANCER CENTER ONLY)
Abs Immature Granulocytes: 0.04 10*3/uL (ref 0.00–0.07)
Basophils Absolute: 0.1 10*3/uL (ref 0.0–0.1)
Basophils Relative: 1 %
Eosinophils Absolute: 0.2 10*3/uL (ref 0.0–0.5)
Eosinophils Relative: 2 %
HCT: 44.6 % (ref 39.0–52.0)
Hemoglobin: 15.5 g/dL (ref 13.0–17.0)
Immature Granulocytes: 0 %
Lymphocytes Relative: 13 %
Lymphs Abs: 1.4 10*3/uL (ref 0.7–4.0)
MCH: 31.4 pg (ref 26.0–34.0)
MCHC: 34.8 g/dL (ref 30.0–36.0)
MCV: 90.3 fL (ref 80.0–100.0)
Monocytes Absolute: 0.6 10*3/uL (ref 0.1–1.0)
Monocytes Relative: 6 %
Neutro Abs: 8.3 10*3/uL — ABNORMAL HIGH (ref 1.7–7.7)
Neutrophils Relative %: 78 %
Platelet Count: 247 10*3/uL (ref 150–400)
RBC: 4.94 MIL/uL (ref 4.22–5.81)
RDW: 13.2 % (ref 11.5–15.5)
WBC Count: 10.6 10*3/uL — ABNORMAL HIGH (ref 4.0–10.5)
nRBC: 0 % (ref 0.0–0.2)

## 2021-10-28 LAB — POCT GLYCOSYLATED HEMOGLOBIN (HGB A1C)
HbA1c POC (<> result, manual entry): 7.6 % (ref 4.0–5.6)
HbA1c, POC (controlled diabetic range): 7.6 % — AB (ref 0.0–7.0)
HbA1c, POC (prediabetic range): 7.6 % — AB (ref 5.7–6.4)
Hemoglobin A1C: 7.6 % — AB (ref 4.0–5.6)

## 2021-10-28 LAB — MICROALBUMIN / CREATININE URINE RATIO
Creatinine,U: 131 mg/dL
Microalb Creat Ratio: 19.8 mg/g (ref 0.0–30.0)
Microalb, Ur: 25.9 mg/dL — ABNORMAL HIGH (ref 0.0–1.9)

## 2021-10-28 LAB — FERRITIN: Ferritin: 45 ng/mL (ref 24–336)

## 2021-10-28 LAB — TSH: TSH: 1.19 u[IU]/mL (ref 0.35–5.50)

## 2021-10-28 LAB — VITAMIN D 25 HYDROXY (VIT D DEFICIENCY, FRACTURES): VITD: 33.93 ng/mL (ref 30.00–100.00)

## 2021-10-28 MED ORDER — EZETIMIBE 10 MG PO TABS: 10.0000 mg | ORAL_TABLET | Freq: Every day | ORAL | 3 refills | Status: DC

## 2021-10-28 MED ORDER — ZOSTER VAC RECOMB ADJUVANTED 50 MCG/0.5ML IM SUSR
0.5000 mL | Freq: Once | INTRAMUSCULAR | 1 refills | Status: AC
Start: 2021-10-28 — End: 2021-10-28

## 2021-10-28 MED ORDER — RYBELSUS 7 MG PO TABS: 1.0000 | ORAL_TABLET | Freq: Every day | ORAL | 1 refills | Status: DC

## 2021-10-28 MED ORDER — ROSUVASTATIN CALCIUM 20 MG PO TABS: 20.0000 mg | ORAL_TABLET | Freq: Every day | ORAL | 3 refills | Status: DC

## 2021-10-28 MED ORDER — FAMCICLOVIR 250 MG PO TABS: 250.0000 mg | ORAL_TABLET | Freq: Every day | ORAL | 12 refills | Status: DC

## 2021-10-28 MED ORDER — INSULIN GLARGINE 100 UNIT/ML ~~LOC~~ SOLN: SUBCUTANEOUS | 5 refills | Status: DC

## 2021-10-28 NOTE — Progress Notes (Signed)
?Hematology and Oncology Follow Up Visit ? ?Marvin Salinas ?[id number] ?1945/12/06 76 y.o. ?10/28/2021 ? ? ?Principle Diagnosis:  ?Lambda light chain myeloma --normal cytogenetics ? ?Current Therapy:   ?Faspro/Velcade/Revlimid ?    ?Interim History:  Marvin Salinas is back for follow-up.  We have a diagnosis on him.  He clearly has light chain myeloma.  We did his serum light chains.  His lambda light chain was 1044 mg/L.  We did a 24-hour urine on him.  He is excreting 855 mg/L of light chain. ? ?Of note, his IgG level is 497m/dL.  The IgA level is 57 mg/dL. ? ?We did do a bone marrow biopsy on him.  This was done on 10/05/2021.  The pathology report ([id number]) showed hypercellular marrow with evidence of myeloma.  He has about 10-15% of plasma cells. ? ?He does have some mild renal insufficiency.  Again this might be from diabetes more than from myeloma. ? ?I do think that his bone survey did not show any evidence of lytic lesions.  I do not think we need a PET scan on him.  He is not complain of any bony pain. ? ?I think that we are going to have to treat this.  I do think that his light chain levels are so high that he is at significant risk for renal insufficiency and renal failure. ? ?I had a long talk with him.  I explained why I thought that he would benefit from treatment.  We can do treatment such that he does not need anything IV. ? ?He does have diabetes.  As such, I think we will have to hold his Decadron with this protocol. ? ?I think that he would be a good candidate for Faspro along with Velcade and Revlimid.  He is already on Eliquis so he does not need aspirin. ? ?I think that he would do well with this.  We can follow his light chains. ? ?Currently, I would say that his performance status is probably ECOG 1. ? ?Medications:  ?Current Outpatient Medications:  ?  ascorbic acid (VITAMIN C) 500 MG tablet, Take by mouth., Disp: , Rfl:  ?  BD INSULIN SYRINGE U/F 31G X 5/16" 0.5 ML MISC, E.11.9- monitor  blood glucose tid for IDDM, Disp: 100 each, Rfl: 4 ?  blood glucose meter kit and supplies KIT, Dispense based on patient and insurance preference. Use up to four times daily as directed., Disp: 1 each, Rfl: 0 ?  Carboxymethylcell-Hypromellose (GENTEAL OP), Place 1 drop into both eyes daily as needed (dry eyes)., Disp: , Rfl:  ?  cholecalciferol (VITAMIN D3) 25 MCG (1000 UNIT) tablet, Take 1,000 Units by mouth daily., Disp: , Rfl:  ?  co-enzyme Q-10 30 MG capsule, Take 30 mg by mouth daily., Disp: , Rfl:  ?  dofetilide (TIKOSYN) 125 MCG capsule, TAKE 1 CAPSULE BY MOUTH TWICE A DAY, Disp: 180 capsule, Rfl: 3 ?  ELIQUIS 5 MG TABS tablet, TAKE 1 TABLET BY MOUTH TWICE A DAY, Disp: 180 tablet, Rfl: 1 ?  ezetimibe (ZETIA) 10 MG tablet, Take 1 tablet (10 mg total) by mouth daily., Disp: 90 tablet, Rfl: 3 ?  ferrous sulfate 325 (65 FE) MG tablet, Take 1 tablet (325 mg total) by mouth every other day., Disp: , Rfl:  ?  fluticasone (FLONASE) 50 MCG/ACT nasal spray, Place 2 sprays into both nostrils daily., Disp: , Rfl:  ?  insulin glargine (LANTUS) 100 UNIT/ML injection, 30 units subcu injection in the morning and  35 units subcu injection before bed., Disp: 30 mL, Rfl: 5 ?  Lancets (ONETOUCH DELICA PLUS [id number]) MISC, USE UP TO FOUR TIMES DAILY AS DIRECTED **EMERGENCY FILL**, Disp: 100 each, Rfl: 11 ?  loratadine (CLARITIN) 10 MG tablet, Take 10 mg by mouth daily as needed for allergies., Disp: , Rfl:  ?  Magnesium 250 MG TABS, Take by mouth., Disp: , Rfl:  ?  metoprolol tartrate (LOPRESSOR) 25 MG tablet, Take 0.5 tablets (12.5 mg total) by mouth 2 (two) times daily., Disp: 90 tablet, Rfl: 3 ?  olmesartan (BENICAR) 40 MG tablet, Take 40 mg by mouth daily., Disp: , Rfl:  ?  ONETOUCH VERIO test strip, USE UP TO FOUR TIMES DAILY AS DIRECTED., Disp: 100 strip, Rfl: 3 ?  polyethylene glycol powder (GLYCOLAX/MIRALAX) 17 GM/SCOOP powder, Take by mouth., Disp: , Rfl:  ?  rosuvastatin (CRESTOR) 20 MG tablet, Take 1 tablet (20 mg  total) by mouth at bedtime., Disp: 90 tablet, Rfl: 3 ?  Semaglutide (RYBELSUS) 7 MG TABS, Take 1 tablet by mouth daily with breakfast., Disp: 90 tablet, Rfl: 1 ?  Zoster Vaccine Adjuvanted [facility] injection, Inject 0.5 mLs into the muscle once for 1 dose. Rpt in 2-6 months, Disp: 0.5 mL, Rfl: 1 ?  nitroGLYCERIN (NITROSTAT) 0.4 MG SL tablet, PLACE 1 TABLET (0.4 MG TOTAL) UNDER THE TONGUE EVERY 5 (FIVE) MINUTES AS NEEDED FOR CHEST PAIN. (Patient not taking: Reported on 10/28/2021), Disp: 25 tablet, Rfl: 12 ? ?Allergies:  ?Allergies  ?Allergen Reactions  ? Codeine Nausea And Vomiting  ? Lisinopril Cough  ? Nsaids Other (See Comments)  ?  CKD  ? Latex Hives  ? ? ?Past Medical History, Surgical history, Social history, and Family History were reviewed and updated. ? ?Review of Systems: ?Review of Systems  ?Constitutional:  Positive for fatigue.  ?HENT:  Negative.    ?Eyes: Negative.   ?Respiratory: Negative.    ?Cardiovascular: Negative.   ?Gastrointestinal: Negative.   ?Endocrine: Negative.   ?Genitourinary: Negative.    ?Musculoskeletal: Negative.   ?Skin: Negative.   ?Neurological: Negative.   ?Hematological: Negative.   ?Psychiatric/Behavioral: Negative.    ? ?Physical Exam: ? height is '5\' 8"'  (1.727 m) and weight is 168 lb (76.2 kg). His oral temperature is 97.9 ?F (36.6 ?C). His blood pressure is 137/67 and his pulse is 62. His respiration is 18 and oxygen saturation is 98%.  ? ?Wt Readings from Last 3 Encounters:  ?10/28/21 168 lb (76.2 kg)  ?10/28/21 168 lb (76.2 kg)  ?10/05/21 173 lb (78.5 kg)  ? ? ?Physical Exam ?Vitals reviewed.  ?HENT:  ?   Head: Normocephalic and atraumatic.  ?Eyes:  ?   Pupils: Pupils are equal, round, and reactive to light.  ?Cardiovascular:  ?   Rate and Rhythm: Normal rate and regular rhythm.  ?   Heart sounds: Normal heart sounds.  ?Pulmonary:  ?   Effort: Pulmonary effort is normal.  ?   Breath sounds: Normal breath sounds.  ?Abdominal:  ?   General: Bowel sounds are normal.  ?    Palpations: Abdomen is soft.  ?Musculoskeletal:     ?   General: No tenderness or deformity. Normal range of motion.  ?   Cervical back: Normal range of motion.  ?Lymphadenopathy:  ?   Cervical: No cervical adenopathy.  ?Skin: ?   General: Skin is warm and dry.  ?   Findings: No erythema or rash.  ?Neurological:  ?   Mental Status: He is alert  and oriented to person, place, and time.  ?Psychiatric:     ?   Behavior: Behavior normal.     ?   Thought Content: Thought content normal.     ?   Judgment: Judgment normal.  ? ? ? ?Lab Results  ?Component Value Date  ? WBC 10.6 (H) 10/28/2021  ? HGB 15.5 10/28/2021  ? HCT 44.6 10/28/2021  ? MCV 90.3 10/28/2021  ? PLT 247 10/28/2021  ? ?  Chemistry   ?   ?Component Value Date/Time  ? NA 140 10/28/2021 1223  ? NA 140 08/31/2021 1026  ? K 4.3 10/28/2021 1223  ? CL 106 10/28/2021 1223  ? CO2 26 10/28/2021 1223  ? BUN 30 (H) 10/28/2021 1223  ? BUN 21 08/31/2021 1026  ? CREATININE 1.46 (H) 10/28/2021 1223  ?    ?Component Value Date/Time  ? CALCIUM 9.5 10/28/2021 1223  ? ALKPHOS 54 10/28/2021 1223  ? AST 23 10/28/2021 1223  ? ALT 36 10/28/2021 1223  ? BILITOT 0.5 10/28/2021 1223  ?  ? ? ?Impression and Plan: ?Mr. Delucia is a very nice 76 year old white male.  He has lambda light chain myeloma.  He has mild renal insufficiency.  He does not have any bone lesions.  He is not hypercalcemic. ? ?Again, I think that it would be best to treat him.  I just worry about the renal insufficiency and the high levels of light chain. ? ?Again, I gave him information about the Faspro/Velcade/Revlimid protocol.  I went over side effects with him.  He is already on Eliquis so that will help. ? ?I told him about Famvir.  I think that he needs to be on Famvir because of the Velcade. ? ?Hopefully, we will be able to get treatment started next week. ? ?I would use the Velcade 2 weeks on and 1 week off. ? ?We will plan to get him back to see [place] probably in about 4 weeks or so.  Hopefully we will see  that his light chains will come down nicely. ? ? ?Volanda Napoleon, MD ?5/10/20235:30 PM  ?

## 2021-10-28 NOTE — Patient Instructions (Addendum)
No follow-ups on file. ? ? ? ? ? ? ? ?Great to see you today.  ?I have refilled the medication(s) we provide.  ? ?If labs were collected, we will inform you of lab results once received either by echart message or telephone call.  ? - echart message- for normal results that have been seen by the patient already.  ? - telephone call: abnormal results or if patient has not viewed results in their echart. ? ?Increase insulin to 30 units before breakfast and keep 35 units at night. ?A1c improved- now 7.6. Goal is < 7. ?

## 2021-10-28 NOTE — Progress Notes (Signed)
START ON PATHWAY REGIMEN - Multiple Myeloma and Other Plasma Cell Dyscrasias ? ? ?  Cycles 1 and 2: A cycle is every 28 days: ?    Lenalidomide  ?    Dexamethasone  ?    Daratumumab and hyaluronidase-fihj  ?  Cycles 3 through 6: A cycle is every 28 days: ?    Lenalidomide  ?    Dexamethasone  ?    Daratumumab and hyaluronidase-fihj  ?  Cycles 7 and beyond: A cycle is every 28 days: ?    Lenalidomide  ?    Dexamethasone  ?    Daratumumab and hyaluronidase-fihj  ? ?**Always confirm dose/schedule in your pharmacy ordering system** ? ?Patient Characteristics: ?Multiple Myeloma, Newly Diagnosed, Transplant Ineligible or Refused, Standard Risk ?Disease Classification: Multiple Myeloma ?R-ISS Staging: III ?Therapeutic Status: Newly Diagnosed ?Is Patient Eligible for Transplant<= Transplant Ineligible or Refused ?Risk Status: Standard Risk ?Intent of Therapy: ?Non-Curative / Palliative Intent, Discussed with Patient ?

## 2021-10-28 NOTE — Progress Notes (Signed)
? ? ?This visit occurred during the SARS-CoV-2 public health emergency.  Safety protocols were in place, including screening questions prior to the visit, additional usage of staff PPE, and extensive cleaning of exam room while observing appropriate contact time as indicated for disinfecting solutions.  ? ? ?Patient ID: Marvin Salinas, male  DOB: [birth year], 76 y.o.   MRN: [id number] ?Patient Care Team  ?  Relationship Specialty Notifications Start End  ?Ma Hillock, DO PCP - General Family Medicine  01/24/19   ?Chesley Mires, MD Consulting Physician Pulmonary Disease  01/26/19   ?Calvert Cantor, MD Consulting Physician Ophthalmology  01/26/19   ?[name], Wonda Cheng, MD Consulting Physician Cardiology  04/30/20   ?Wine, Argie Ramming, [facility] Care Management   06/17/20   ?Milus Banister, MD Attending Physician Gastroenterology  10/23/20   ?Celso Amy, NP Nurse Practitioner Nurse Practitioner  10/23/20   ? Comment: Hematology  ?Cameron Sprang, MD Consulting Physician Neurology  09/01/21   ?Liana Gerold, MD Consulting Physician Nephrology  10/28/21   ? Comment: [place] [facility]  ? ? ?Chief Complaint  ?Patient presents with  ? Diabetes  ?  [place]; pt is fasting. Sugar this morning was 102.  ? ? ?Subjective: ?Marvin Salinas is a 76 y.o.  male present for  [facility] ?Type 2 diabetes mellitus with stage 3 chronic kidney disease, with long-term current use of insulin ([place]) ?Pt reports compliance with Lantus 25 units in the morning and 35 units nightly, Rybelsus ** mg daily .Marland Kitchen   He reports his fasting blood glucose is approximately 102.  His nighttime blood glucoses, he is watching his diet closely and has lost weight. ? ?Atrial fibrillation with RVR (HCC)/Essential hypertension/Ischemic heart disease/ S/P CABG x 3/Paroxysmal atrial flutter (HCC)/PAD (peripheral artery disease) (HCC)/Nonsustained ventricular tachycardia (HCC)/HLD/NSTEMI/h/o stroke/CKD 3/chronic anticoagulation ?Patient has a history of severe  3-vessel coronary artery disease with chronic total occlusions of the proximal/mid LAD, mid/distal left circumflex/and proximal RCA.  Moderate caliber D1 and distal RPDA demonstrate 70-80% stenosis.  Moderately elevated left ventricular filling pressure.  Coronary artery stents and CABG have been completed in the past.  He also has a history of cerebral ischemia and TIAs. ?Pt reports compliance with  Crestor, Eliquis, Benicar 30, metoprolol 12.5 mg twice daily, Zetia, tikosyn. Patient denies chest pain, shortness of breath, dizziness or lower extremity edema.  ?Pt is prescribed statin. ?Diet: Heart healthy-low-sodium diet encouraged ?RF: Hypertension, hyperlipidemia, former smoker, STEMI, stroke, diabetes, family history, CAD, CVD, PAD, bilateral carotid artery stenosis, TIA ?  ? ? ?  10/28/2021  ?  9:13 AM 10/19/2021  ? 10:14 AM 10/19/2021  ? 10:09 AM 09/24/2021  ?  2:18 PM 06/29/2021  ?  4:03 PM  ?Depression screen PHQ 2/9  ?Decreased Interest 1  0 0 0  ?Down, Depressed, Hopeless '1 2 1 1 1  '$ ?PHQ - 2 Score '2 2 1 1 1  '$ ?Altered sleeping 1 2     ?Tired, decreased energy 1 1     ?Change in appetite 0 0     ?Feeling bad or failure about yourself  0 0     ?Trouble concentrating 0 0     ?Moving slowly or fidgety/restless 0 0     ?Suicidal thoughts 0 0     ?PHQ-9 Score 4 5     ?Difficult doing work/chores Somewhat difficult Somewhat difficult     ? ? ?  10/28/2021  ?  9:13 AM  ?GAD  7 : Generalized Anxiety Score  ?Nervous, Anxious, on Edge 0  ?Control/stop worrying 1  ?Worry too much - different things 1  ?Trouble relaxing 1  ?Restless 1  ?Easily annoyed or irritable 0  ?Afraid - awful might happen 0  ?Total GAD 7 Score 4  ? ?  ?  ? ?  10/28/2021  ?  9:09 AM 09/24/2021  ? 10:24 AM 09/01/2021  ?  8:46 AM 06/29/2021  ? 12:43 PM 04/24/2021  ?  4:09 PM  ?Fall Risk   ?Falls in the past year? 1 0 1 0 0  ?Number falls in past yr: 1 0 1 0 0  ?Injury with Fall? 0 0 0 0 0  ?Risk for fall due to : Impaired vision      ?Follow up Falls evaluation  completed   Falls evaluation completed Falls evaluation completed  ? ? ?Immunization History  ?Administered Date(s) Administered  ? Fluad Quad(high Dose 65+) 02/12/2019, 04/29/2020, 04/22/2021  ? Influenza Split 05/08/2013, 03/28/2017  ? Influenza, High Dose Seasonal PF 05/08/2013, 03/28/2017, 05/02/2018  ? Influenza,inj,Quad PF,6+ Mos 04/21/2016  ? [place] COVID-19 SARS-COV-2 PEDS BIVALENT BOOSTER 6Y-11Y 06/18/2020, 12/10/2020  ? Moderna Sars-Covid-2 Vaccination 08/27/2019, 10/03/2019  ? Pneumococcal Conjugate-13 07/13/2016  ? Pneumococcal Polysaccharide-23 02/21/2018  ? Tdap 02/15/2013  ? Zoster, Live 06/22/2015  ? ? ?No results found. ? ?Past Medical History:  ?Diagnosis Date  ? Acute kidney injury superimposed on chronic kidney disease ([place]) 10/12/2017  ? Atrial fibrillation with RVR ([place]) 10/27/2017  ? Back pain   ? Basilar artery stenosis   ? on chronic Plavix  ? Benign prostatic hypertrophy without urinary obstruction 04/17/2014  ? Carotid artery disease ([place]) 10/06/2010  ? Carotid US 04/2019: Bilat ICA 40-59; L subclavian stenosis  // Carotid US 11/21: Bilat ICA 40-59; bilateral subclavian stenosis // Carotid US 11/22: Bilateral ICA 40-59; left subclavian stenosis  ? Cerebral vascular disease   ? with prior TIA's; followed by Dr. Erling Cruz  ? Depression, neurotic 11/21/2013  ? Diabetes mellitus   ? on insulin  ? Enthesopathy of ankle and tarsus 09/04/2007  ? Overview:  Metatarsalgia   ? Enthesopathy of ankle and tarsus 09/04/2007  ? Overview:  Metatarsalgia  Formatting of this note might be different from the original. Metatarsalgia  10/1 IMO update  ? History of renal calculi   ? Hyperlipidemia   ? Hypertension   ? Ischemic heart disease   ? prior PCI to RCA in 1989. S/P PCI to LAD and OM in 1992. S/P PCI to first DX in 2000. S/P CABG x 3 in May 2011  ? Left hip pain 01/03/2020  ? OSA (obstructive sleep apnea) 01/11/2018  ? AHI 18.1 and SaO2 low 73%  ? PAD (peripheral artery disease) ([place]) 11/18/2017  ?  ABIs/Arterial US 04/2019: R 1.21; L 0.66 // R SFA 30-49, stable > 50 CIA and EIA stenosis; L > 50 CIA stenosis (likely represents severe stenosis or short segment occlusion)  ? Peripheral neuropathy   ? Pneumonia 02/2011  ? Sacroiliac joint dysfunction of left side 01/03/2020  ? Stroke ([place]) 04/16/2001  ? small right cerebellar infarct on 04/16/2001 at that time he was found to have proximal left vertebral artery, proximal left common carotid artery and both external carotid artery stenosis as well as intracranial stenosis involving mid basilar artery- 07/2011 add questionable TIA  ? ?Allergies  ?Allergen Reactions  ? Codeine Nausea And Vomiting  ? Lisinopril Cough  ? Nsaids Other (See Comments)  ?  CKD  ? Latex Hives  ? ?Past Surgical History:  ?Procedure Laterality Date  ?  NASAL ENDOSCOPY  01/11/2020  ? chronic rhinitis w/o evidence of acute sinusitis, bilateral inferior turbinate hypertrophy  ? ABDOMINAL AORTOGRAM W/LOWER EXTREMITY Bilateral 05/30/2019  ? Procedure: ABDOMINAL AORTOGRAM W/LOWER EXTREMITY;  Surgeon: Wellington Hampshire, MD;  Location: [facility];  Service: Cardiovascular;  Laterality: Bilateral;  ? ANGIOPLASTY  1989  ? right coronary artery  ? ANGIOPLASTY  1992  ? LAD and OM  ? ANGIOPLASTY  1998  ? First DX  ? BRAIN SURGERY    ? on prior records  ? CORONARY ARTERY BYPASS GRAFT  11/12/2009  ? LIMA to LAD, SVG to OM and SVG to RCA  ? CORONARY STENT PLACEMENT  2000  ? Stent to LAD/Circumflex with angioplasty to first diagonal   ? LEFT HEART CATH AND CORS/GRAFTS ANGIOGRAPHY N/A 10/13/2017  ? Procedure: LEFT HEART CATH AND CORS/GRAFTS ANGIOGRAPHY;  Surgeon: Nelva Bush, MD;  Location: [facility];  Service: Cardiovascular;  Laterality: N/A;  ? ?Family History  ?Problem Relation Age of Onset  ? Depression Mother   ? Early death Mother   ? Kidney disease Mother   ? Ovarian cancer Mother   ? Hypertension Father   ? Heart disease Father   ? Heart attack Father   ? Asthma Brother   ? Diabetes  Brother   ? Stroke Other   ?     Uncle  ? Diabetes Sister   ? Colon cancer Neg Hx   ? Rectal cancer Neg Hx   ? Stomach cancer Neg Hx   ? Esophageal cancer Neg Hx   ? ?Social History  ? ?Social History Narrative  ? M

## 2021-10-29 ENCOUNTER — Ambulatory Visit: Payer: Commercial Managed Care - HMO | Admitting: Neurology

## 2021-10-29 ENCOUNTER — Encounter: Payer: Self-pay | Admitting: *Deleted

## 2021-10-29 ENCOUNTER — Telehealth: Payer: Self-pay | Admitting: Family Medicine

## 2021-10-29 LAB — IGG, IGA, IGM
IgA: 57 mg/dL — ABNORMAL LOW (ref 61–437)
IgG (Immunoglobin G), Serum: 448 mg/dL — ABNORMAL LOW (ref 603–1613)
IgM (Immunoglobulin M), Srm: 18 mg/dL (ref 15–143)

## 2021-10-29 LAB — IRON AND IRON BINDING CAPACITY (CC-WL,HP ONLY)
Iron: 200 ug/dL — ABNORMAL HIGH (ref 45–182)
Saturation Ratios: 55 % — ABNORMAL HIGH (ref 17.9–39.5)
TIBC: 365 ug/dL (ref 250–450)
UIBC: 165 ug/dL (ref 117–376)

## 2021-10-29 LAB — PTH, INTACT AND CALCIUM
Calcium: 9.8 mg/dL (ref 8.6–10.3)
PTH: 43 pg/mL (ref 16–77)

## 2021-10-29 LAB — KAPPA/LAMBDA LIGHT CHAINS
Kappa free light chain: 28.2 mg/L — ABNORMAL HIGH (ref 3.3–19.4)
Kappa, lambda light chain ratio: 0.03 — ABNORMAL LOW (ref 0.26–1.65)
Lambda free light chains: 908.3 mg/L — ABNORMAL HIGH (ref 5.7–26.3)

## 2021-10-29 NOTE — Telephone Encounter (Signed)
Spoke with patient regarding results/recommendations.  

## 2021-10-29 NOTE — Progress Notes (Signed)
Per Dr Marin Olp request for [facility] testing on specimen [id number] DOS 10/05/2021. Request confirmed by Suanne Marker in pathology. ? ?Oncology Nurse Navigator Documentation ? ? ?  10/29/2021  ?  8:15 AM  ?Oncology Nurse Navigator Flowsheets  ?Navigator Location [facility]  ?Navigator Encounter Type Pathology Review  ?Interventions Coordination of Care  ?Coordination of Care Pathology  ?Time Spent with Patient 15  ?  ?

## 2021-10-29 NOTE — Telephone Encounter (Signed)
Please call patient: ?Vitamin D is normal ?Thyroid is normal ?Parathyroid/calcium are normal ?Urine albumin/creatinine ratio is normal.  He does have evidence of microalbumin, but kidney function is good enough to keep ratio normal.  Keeping blood pressure and diabetes well controlled that is key to lowering the microalbumin. ?

## 2021-10-30 ENCOUNTER — Encounter: Payer: Self-pay | Admitting: Family

## 2021-10-30 ENCOUNTER — Telehealth: Payer: Self-pay | Admitting: Pharmacist

## 2021-10-30 ENCOUNTER — Encounter: Payer: Self-pay | Admitting: *Deleted

## 2021-10-30 ENCOUNTER — Telehealth: Payer: Self-pay | Admitting: Pharmacy Technician

## 2021-10-30 ENCOUNTER — Other Ambulatory Visit: Payer: Self-pay | Admitting: *Deleted

## 2021-10-30 ENCOUNTER — Encounter: Payer: Self-pay | Admitting: Hematology & Oncology

## 2021-10-30 ENCOUNTER — Other Ambulatory Visit (HOSPITAL_COMMUNITY): Payer: Self-pay

## 2021-10-30 MED ORDER — LENALIDOMIDE 10 MG PO CAPS: 10.0000 mg | ORAL_CAPSULE | Freq: Every day | ORAL | 0 refills | Status: DC

## 2021-10-30 NOTE — Telephone Encounter (Signed)
Oral Oncology Patient Advocate Encounter ?  ?Received notification from [employer] that prior authorization for Lenalidomide (Revlimid) is required. ?  ?PA submitted on CoverMyMeds ?Key [id number] ?Status is pending ?  ?Oral Oncology Clinic will continue to follow. ? ?Dennison Nancy CPHT ?Specialty Pharmacy Patient Advocate ?[place] ?Phone [phone] ?Fax [phone] ?10/30/2021 9:40 AM ? ? ?

## 2021-10-30 NOTE — Telephone Encounter (Signed)
Oral Oncology Patient Advocate Encounter ? ?Prior Authorization for Marvin Salinas has been approved.   ? ?PA# [id number] ?Effective dates: 10/30/21 through 06/20/22 ? ?Test claim revealed $0.00 copay. ? ?Oral Oncology Clinic will continue to follow.  ? ?Dennison Nancy CPHT ?Specialty Pharmacy Patient Advocate ?[place] ?Phone [phone] ?Fax [phone] ?10/30/2021 10:21 AM ? ?

## 2021-10-30 NOTE — Progress Notes (Signed)
Call placed to patient to enroll and instruct pt on Revlimid per order of Dr. Marin Olp.  Teach back done.  Pt is appreciative of assistance and call and has no questions at this time.  ?

## 2021-10-30 NOTE — Progress Notes (Signed)
Pharmacist Chemotherapy Monitoring - Initial Assessment   ? ?Anticipated start date: 11/05/21  ? ?The following has been reviewed per standard work regarding the patient's treatment regimen: ?The patient's diagnosis, treatment plan and drug doses, and organ/hematologic function ?Lab orders and baseline tests specific to treatment regimen  ?The treatment plan start date, drug sequencing, and pre-medications ?Prior authorization status  ?Patient's documented medication list, including drug-drug interaction screen and prescriptions for anti-emetics and supportive care specific to the treatment regimen ?The drug concentrations, fluid compatibility, administration routes, and timing of the medications to be used ?The patient's access for treatment and lifetime cumulative dose history, if applicable  ?The patient's medication allergies and previous infusion related reactions, if applicable  ? ?Changes made to treatment plan:  ?N/A ? ?Follow up needed:  ?N/A ? ? ?Philomena Course, [place], ?10/30/2021  12:15 PM  ?

## 2021-10-30 NOTE — Telephone Encounter (Addendum)
Oral Oncology Pharmacist Encounter ? ?Received new prescription for Revlimid (lenalidomide) for the treatment of multiple myeloma in conjunction with daratumumab and bortezomib (patient will not be receiving dexamethasone per MD due to diabetes), planned duration until disease progression or unacceptable drug toxicity. ? ?CBC w/ Diff and CMP from 10/28/21 assessed, noted pt Scr of 1.46 mg/dL (CrCl ~46.4 mL/min). Dose of Revlimid has been dose adjusted for patient's renal dysfunction. Prescription dose and frequency assessed for appropriateness.  ? ?Current medication list in Epic reviewed, no relevant/significant DDIs with Revlimid identified. ? ?Evaluated chart and no patient barriers to medication adherence noted.  ? ?Prescription has been e-scribed to [facility] due to limited distribution nature of drug.  ? ?Oral Oncology Clinic will continue to follow for insurance authorization, copayment issues, initial counseling and start date. ? ?Leron Croak, PharmD, BCPS ?Hematology/Oncology Clinical Pharmacist ?[facility] Oral Chemotherapy Navigation Clinics ?[phone] ?10/30/2021 10:08 AM ? ?

## 2021-11-02 LAB — PROTEIN ELECTROPHORESIS, SERUM
A/G Ratio: 1.5 (ref 0.7–1.7)
Albumin ELP: 3.7 g/dL (ref 2.9–4.4)
Alpha-1-Globulin: 0.2 g/dL (ref 0.0–0.4)
Alpha-2-Globulin: 0.8 g/dL (ref 0.4–1.0)
Beta Globulin: 0.9 g/dL (ref 0.7–1.3)
Gamma Globulin: 0.4 g/dL (ref 0.4–1.8)
Globulin, Total: 2.4 g/dL (ref 2.2–3.9)
Total Protein ELP: 6.1 g/dL (ref 6.0–8.5)

## 2021-11-02 NOTE — Telephone Encounter (Signed)
Oral Chemotherapy Pharmacist Encounter ? ?I spoke with patient for overview of: Revlimid for the treatment of multiple myeloma in conjunction with daratumumab and bortezomib (patient will not be receiving dexamethasone per MD due to diabetes), planned duration until disease progression or unacceptable drug toxicity. ? ?Counseled patient on administration, dosing, side effects, monitoring, drug-food interactions, safe handling, storage, and disposal. ? ?Patient will take Revlimid 73m capsules, 1 capsule by mouth once daily, without regard to food, with a full glass of water. ? ?Revlimid will be given 21 days on, 7 days off, repeat every 28 days. ? ?Revlimid start date: 11/05/21 ? ?Adverse effects of Revlimid include but are not limited to: nausea, abdominal pain, rash, fatigue, and decreased blood counts.   ? ?Reviewed with patient importance of keeping a medication schedule and plan for any missed doses. No barriers to medication adherence identified. ? ?Medication reconciliation performed and medication/allergy list updated. ? ?Patient on apixaban 5 mg PO BID - appropriate for VTE PPX with Revlimid.  ? ?Insurance authorization for Revlimid has been obtained. ? ?[place]. Provided patient with phone number to pharmacy [phone] to call and set up shipment of medication.  ? ?All questions answered. ? ?Mr. HMurrivoiced understanding and appreciation.  ? ?Medication education handout placed in mail for patient. Patient knows to call the office with questions or concerns. Oral Chemotherapy Clinic phone number provided to patient.  ? ?RLeron Croak PharmD, BCPS ?Hematology/Oncology Clinical Pharmacist ?[facility] ?[phone]?11/02/2021 12:03 PM ? ?

## 2021-11-03 ENCOUNTER — Other Ambulatory Visit: Payer: Self-pay | Admitting: *Deleted

## 2021-11-03 ENCOUNTER — Other Ambulatory Visit: Payer: Self-pay | Admitting: Hematology & Oncology

## 2021-11-03 ENCOUNTER — Inpatient Hospital Stay: Payer: Medicare Other

## 2021-11-03 ENCOUNTER — Encounter: Payer: Self-pay | Admitting: *Deleted

## 2021-11-03 DIAGNOSIS — C9000 Multiple myeloma not having achieved remission: Secondary | ICD-10-CM

## 2021-11-03 MED ORDER — ONDANSETRON HCL 8 MG PO TABS: 8.0000 mg | ORAL_TABLET | Freq: Two times a day (BID) | ORAL | 1 refills | Status: DC | PRN

## 2021-11-03 MED ORDER — PROCHLORPERAZINE MALEATE 10 MG PO TABS: 10.0000 mg | ORAL_TABLET | Freq: Four times a day (QID) | ORAL | 1 refills | Status: DC | PRN

## 2021-11-03 NOTE — Progress Notes (Signed)
Patient in chemotherapy education class.  Discussed side effects of Velcade, Faspro and Revlimid which include but are not limited to myelosuppression, decreased appetite, fatigue, fever, allergic or infusional reaction, mucositis, cardiac toxicity, cough, SOB, altered taste, nausea and vomiting, diarrhea, constipation, elevated LFTs myalgia and arthralgias, hair loss or thinning, rash, skin dryness, nail changes, peripheral neuropathy, delayed wound healing, mental changes (Chemo brain), increased risk of infections, weight loss.  Reviewed infusion room and office policy and procedure and phone numbers.    Reviewed when to call the office with any concerns or problems.  Scientist, clinical (histocompatibility and immunogenetics) given.   Antiemetic protocol and chemotherapy schedule reviewed. Patient verbalized understanding of chemotherapy indications and possible side effects.  Teachback done  ?

## 2021-11-05 ENCOUNTER — Inpatient Hospital Stay: Payer: Medicare Other

## 2021-11-05 VITALS — BP 127/69 | HR 58 | Temp 97.5°F | Resp 17

## 2021-11-05 DIAGNOSIS — C9000 Multiple myeloma not having achieved remission: Secondary | ICD-10-CM | POA: Diagnosis not present

## 2021-11-05 DIAGNOSIS — Z5112 Encounter for antineoplastic immunotherapy: Secondary | ICD-10-CM | POA: Diagnosis not present

## 2021-11-05 DIAGNOSIS — Z79899 Other long term (current) drug therapy: Secondary | ICD-10-CM | POA: Diagnosis not present

## 2021-11-05 LAB — CMP (CANCER CENTER ONLY)
ALT: 26 U/L (ref 0–44)
AST: 19 U/L (ref 15–41)
Albumin: 4.3 g/dL (ref 3.5–5.0)
Alkaline Phosphatase: 51 U/L (ref 38–126)
Anion gap: 6 (ref 5–15)
BUN: 26 mg/dL — ABNORMAL HIGH (ref 8–23)
CO2: 28 mmol/L (ref 22–32)
Calcium: 9.1 mg/dL (ref 8.9–10.3)
Chloride: 103 mmol/L (ref 98–111)
Creatinine: 1.47 mg/dL — ABNORMAL HIGH (ref 0.61–1.24)
GFR, Estimated: 49 mL/min — ABNORMAL LOW (ref >60)
Glucose, Bld: 219 mg/dL — ABNORMAL HIGH (ref 70–99)
Potassium: 4.5 mmol/L (ref 3.5–5.1)
Sodium: 137 mmol/L (ref 135–145)
Total Bilirubin: 0.4 mg/dL (ref 0.3–1.2)
Total Protein: 6.3 g/dL — ABNORMAL LOW (ref 6.5–8.1)

## 2021-11-05 LAB — CBC WITH DIFFERENTIAL (CANCER CENTER ONLY)
Abs Immature Granulocytes: 0.02 10*3/uL (ref 0.00–0.07)
Basophils Absolute: 0.1 10*3/uL (ref 0.0–0.1)
Basophils Relative: 1 %
Eosinophils Absolute: 0.3 10*3/uL (ref 0.0–0.5)
Eosinophils Relative: 3 %
HCT: 44.4 % (ref 39.0–52.0)
Hemoglobin: 15 g/dL (ref 13.0–17.0)
Immature Granulocytes: 0 %
Lymphocytes Relative: 17 %
Lymphs Abs: 1.4 10*3/uL (ref 0.7–4.0)
MCH: 31.7 pg (ref 26.0–34.0)
MCHC: 33.8 g/dL (ref 30.0–36.0)
MCV: 93.9 fL (ref 80.0–100.0)
Monocytes Absolute: 0.6 10*3/uL (ref 0.1–1.0)
Monocytes Relative: 7 %
Neutro Abs: 6.1 10*3/uL (ref 1.7–7.7)
Neutrophils Relative %: 72 %
Platelet Count: 237 10*3/uL (ref 150–400)
RBC: 4.73 MIL/uL (ref 4.22–5.81)
RDW: 13.3 % (ref 11.5–15.5)
WBC Count: 8.5 10*3/uL (ref 4.0–10.5)
nRBC: 0 % (ref 0.0–0.2)

## 2021-11-05 LAB — TYPE AND SCREEN
ABO/RH(D): O POS
Antibody Screen: NEGATIVE

## 2021-11-05 LAB — PRETREATMENT RBC PHENOTYPE

## 2021-11-05 MED ORDER — DEXAMETHASONE 4 MG PO TABS
20.0000 mg | ORAL_TABLET | Freq: Once | ORAL | Status: AC
  Administered 2021-11-05: 20 mg via ORAL
  Filled 2021-11-05: qty 5

## 2021-11-05 MED ORDER — DIPHENHYDRAMINE HCL 25 MG PO CAPS
50.0000 mg | ORAL_CAPSULE | Freq: Once | ORAL | Status: AC
  Administered 2021-11-05: 50 mg via ORAL
  Filled 2021-11-05: qty 2

## 2021-11-05 MED ORDER — DARATUMUMAB-HYALURONIDASE-FIHJ 1800-30000 MG-UT/15ML ~~LOC~~ SOLN
1800.0000 mg | Freq: Once | SUBCUTANEOUS | Status: AC
  Administered 2021-11-05: 1800 mg via SUBCUTANEOUS
  Filled 2021-11-05: qty 15

## 2021-11-05 MED ORDER — ACETAMINOPHEN 325 MG PO TABS
650.0000 mg | ORAL_TABLET | Freq: Once | ORAL | Status: AC
  Administered 2021-11-05: 650 mg via ORAL
  Filled 2021-11-05: qty 2

## 2021-11-05 MED ORDER — BORTEZOMIB CHEMO SQ INJECTION 3.5 MG (2.5MG/ML)
1.3000 mg/m2 | Freq: Once | INTRAMUSCULAR | Status: AC
  Administered 2021-11-05: 2.5 mg via SUBCUTANEOUS
  Filled 2021-11-05: qty 1

## 2021-11-05 MED ORDER — MONTELUKAST SODIUM 10 MG PO TABS
10.0000 mg | ORAL_TABLET | Freq: Once | ORAL | Status: AC
  Administered 2021-11-05: 10 mg via ORAL
  Filled 2021-11-05: qty 1

## 2021-11-05 NOTE — Patient Instructions (Signed)
[facility]  Discharge Instructions: Thank you for choosing [facility] to provide your oncology and hematology care.   If you have a lab appointment with the [facility], please go directly to the [facility] and check in at the registration area.  Wear comfortable clothing and clothing appropriate for easy access to any Portacath or PICC line.   We strive to give you quality time with your provider. You may need to reschedule your appointment if you arrive late (15 or more minutes).  Arriving late affects you and other patients whose appointments are after yours.  Also, if you miss three or more appointments without notifying the office, you may be dismissed from the clinic at the provider's discretion.      For prescription refill requests, have your pharmacy contact our office and allow 72 hours for refills to be completed.    Today you received the following chemotherapy and/or immunotherapy agents velcade darzalex     To help prevent nausea and vomiting after your treatment, we encourage you to take your nausea medication as directed.  BELOW ARE SYMPTOMS THAT SHOULD BE REPORTED IMMEDIATELY: *FEVER GREATER THAN 100.4 F (38 C) OR HIGHER *CHILLS OR SWEATING *NAUSEA AND VOMITING THAT IS NOT CONTROLLED WITH YOUR NAUSEA MEDICATION *UNUSUAL SHORTNESS OF BREATH *UNUSUAL BRUISING OR BLEEDING *URINARY PROBLEMS (pain or burning when urinating, or frequent urination) *BOWEL PROBLEMS (unusual diarrhea, constipation, pain near the anus) TENDERNESS IN MOUTH AND THROAT WITH OR WITHOUT PRESENCE OF ULCERS (sore throat, sores in mouth, or a toothache) UNUSUAL RASH, SWELLING OR PAIN  UNUSUAL VAGINAL DISCHARGE OR ITCHING   Items with * indicate a potential emergency and should be followed up as soon as possible or go to the Emergency Department if any problems should occur.  Please show the CHEMOTHERAPY ALERT CARD or IMMUNOTHERAPY ALERT CARD at check-in  to the Emergency Department and triage nurse. Should you have questions after your visit or need to cancel or reschedule your appointment, please contact [facility]  [phone] and follow the prompts.  Office hours are 8:00 a.m. to 4:30 p.m. Monday - Friday. Please note that voicemails left after 4:00 p.m. may not be returned until the following business day.  We are closed weekends and major holidays. You have access to a nurse at all times for urgent questions. Please call the main number to the clinic [phone] and follow the prompts.  For any non-urgent questions, you may also contact your provider using MyChart. We now offer e-Visits for anyone 74 and older to request care online for non-urgent symptoms. For details visit [email or web address].   Also download the MyChart app! Go to the app store, search "MyChart", open the app, select [facility], and log in with your MyChart username and password.  Due to Covid, a mask is required upon entering the hospital/clinic. If you do not have a mask, one will be given to you upon arrival. For doctor visits, patients may have 1 support person aged 76 or older with them. For treatment visits, patients cannot have anyone with them due to current Covid guidelines and our immunocompromised population.

## 2021-11-09 ENCOUNTER — Encounter (HOSPITAL_COMMUNITY): Payer: Self-pay | Admitting: Hematology & Oncology

## 2021-11-12 DIAGNOSIS — N189 Chronic kidney disease, unspecified: Secondary | ICD-10-CM | POA: Diagnosis not present

## 2021-11-12 DIAGNOSIS — R809 Proteinuria, unspecified: Secondary | ICD-10-CM | POA: Diagnosis not present

## 2021-11-12 DIAGNOSIS — E1129 Type 2 diabetes mellitus with other diabetic kidney complication: Secondary | ICD-10-CM | POA: Diagnosis not present

## 2021-11-12 DIAGNOSIS — E1122 Type 2 diabetes mellitus with diabetic chronic kidney disease: Secondary | ICD-10-CM | POA: Diagnosis not present

## 2021-11-12 DIAGNOSIS — D472 Monoclonal gammopathy: Secondary | ICD-10-CM | POA: Diagnosis not present

## 2021-11-13 ENCOUNTER — Inpatient Hospital Stay: Payer: Medicare Other

## 2021-11-13 VITALS — BP 158/52 | HR 51 | Temp 97.7°F | Resp 17

## 2021-11-13 DIAGNOSIS — Z79899 Other long term (current) drug therapy: Secondary | ICD-10-CM | POA: Diagnosis not present

## 2021-11-13 DIAGNOSIS — Z5112 Encounter for antineoplastic immunotherapy: Secondary | ICD-10-CM | POA: Diagnosis not present

## 2021-11-13 DIAGNOSIS — C9000 Multiple myeloma not having achieved remission: Secondary | ICD-10-CM | POA: Diagnosis not present

## 2021-11-13 LAB — CBC WITH DIFFERENTIAL (CANCER CENTER ONLY)
Abs Immature Granulocytes: 0.03 10*3/uL (ref 0.00–0.07)
Basophils Absolute: 0 10*3/uL (ref 0.0–0.1)
Basophils Relative: 1 %
Eosinophils Absolute: 0.5 10*3/uL (ref 0.0–0.5)
Eosinophils Relative: 6 %
HCT: 41.2 % (ref 39.0–52.0)
Hemoglobin: 14 g/dL (ref 13.0–17.0)
Immature Granulocytes: 0 %
Lymphocytes Relative: 10 %
Lymphs Abs: 0.8 10*3/uL (ref 0.7–4.0)
MCH: 31.3 pg (ref 26.0–34.0)
MCHC: 34 g/dL (ref 30.0–36.0)
MCV: 92.2 fL (ref 80.0–100.0)
Monocytes Absolute: 0.6 10*3/uL (ref 0.1–1.0)
Monocytes Relative: 7 %
Neutro Abs: 6.2 10*3/uL (ref 1.7–7.7)
Neutrophils Relative %: 76 %
Platelet Count: 194 10*3/uL (ref 150–400)
RBC: 4.47 MIL/uL (ref 4.22–5.81)
RDW: 13.1 % (ref 11.5–15.5)
WBC Count: 8.1 10*3/uL (ref 4.0–10.5)
nRBC: 0 % (ref 0.0–0.2)

## 2021-11-13 LAB — CMP (CANCER CENTER ONLY)
ALT: 44 U/L (ref 0–44)
AST: 22 U/L (ref 15–41)
Albumin: 4.1 g/dL (ref 3.5–5.0)
Alkaline Phosphatase: 46 U/L (ref 38–126)
Anion gap: 5 (ref 5–15)
BUN: 28 mg/dL — ABNORMAL HIGH (ref 8–23)
CO2: 31 mmol/L (ref 22–32)
Calcium: 8.9 mg/dL (ref 8.9–10.3)
Chloride: 104 mmol/L (ref 98–111)
Creatinine: 1.38 mg/dL — ABNORMAL HIGH (ref 0.61–1.24)
GFR, Estimated: 53 mL/min — ABNORMAL LOW (ref >60)
Glucose, Bld: 161 mg/dL — ABNORMAL HIGH (ref 70–99)
Potassium: 4.2 mmol/L (ref 3.5–5.1)
Sodium: 140 mmol/L (ref 135–145)
Total Bilirubin: 0.4 mg/dL (ref 0.3–1.2)
Total Protein: 6 g/dL — ABNORMAL LOW (ref 6.5–8.1)

## 2021-11-13 MED ORDER — ACETAMINOPHEN 325 MG PO TABS
650.0000 mg | ORAL_TABLET | Freq: Once | ORAL | Status: AC
  Administered 2021-11-13: 650 mg via ORAL
  Filled 2021-11-13: qty 2

## 2021-11-13 MED ORDER — DARATUMUMAB-HYALURONIDASE-FIHJ 1800-30000 MG-UT/15ML ~~LOC~~ SOLN
1800.0000 mg | Freq: Once | SUBCUTANEOUS | Status: AC
  Administered 2021-11-13: 1800 mg via SUBCUTANEOUS
  Filled 2021-11-13: qty 15

## 2021-11-13 MED ORDER — BORTEZOMIB CHEMO SQ INJECTION 3.5 MG (2.5MG/ML)
1.3000 mg/m2 | Freq: Once | INTRAMUSCULAR | Status: AC
  Administered 2021-11-13: 2.5 mg via SUBCUTANEOUS
  Filled 2021-11-13: qty 1

## 2021-11-13 MED ORDER — MONTELUKAST SODIUM 10 MG PO TABS
10.0000 mg | ORAL_TABLET | Freq: Once | ORAL | Status: AC
  Administered 2021-11-13: 10 mg via ORAL
  Filled 2021-11-13: qty 1

## 2021-11-13 MED ORDER — DEXAMETHASONE 4 MG PO TABS
20.0000 mg | ORAL_TABLET | Freq: Once | ORAL | Status: AC
  Administered 2021-11-13: 20 mg via ORAL
  Filled 2021-11-13: qty 5

## 2021-11-13 MED ORDER — DIPHENHYDRAMINE HCL 25 MG PO CAPS
50.0000 mg | ORAL_CAPSULE | Freq: Once | ORAL | Status: AC
  Administered 2021-11-13: 50 mg via ORAL
  Filled 2021-11-13: qty 2

## 2021-11-13 NOTE — Patient Instructions (Signed)
[facility] [place]  Discharge Instructions: Thank you for choosing [facility] to provide your oncology and hematology care.   If you have a lab appointment with the [facility], please go directly to the [facility] and check in at the registration area.  Wear comfortable clothing and clothing appropriate for easy access to any Portacath or PICC line.   We strive to give you quality time with your provider. You may need to reschedule your appointment if you arrive late (15 or more minutes).  Arriving late affects you and other patients whose appointments are after yours.  Also, if you miss three or more appointments without notifying the office, you may be dismissed from the clinic at the provider's discretion.      For prescription refill requests, have your pharmacy contact our office and allow 72 hours for refills to be completed.    Today you received the following chemotherapy and/or immunotherapy agents Darzalex.      To help prevent nausea and vomiting after your treatment, we encourage you to take your nausea medication as directed.  BELOW ARE SYMPTOMS THAT SHOULD BE REPORTED IMMEDIATELY: *FEVER GREATER THAN 100.4 F (38 C) OR HIGHER *CHILLS OR SWEATING *NAUSEA AND VOMITING THAT IS NOT CONTROLLED WITH YOUR NAUSEA MEDICATION *UNUSUAL SHORTNESS OF BREATH *UNUSUAL BRUISING OR BLEEDING *URINARY PROBLEMS (pain or burning when urinating, or frequent urination) *BOWEL PROBLEMS (unusual diarrhea, constipation, pain near the anus) TENDERNESS IN MOUTH AND THROAT WITH OR WITHOUT PRESENCE OF ULCERS (sore throat, sores in mouth, or a toothache) UNUSUAL RASH, SWELLING OR PAIN  UNUSUAL VAGINAL DISCHARGE OR ITCHING   Items with * indicate a potential emergency and should be followed up as soon as possible or go to the Emergency Department if any problems should occur.  Please show the CHEMOTHERAPY ALERT CARD or IMMUNOTHERAPY ALERT CARD at check-in to the  Emergency Department and triage nurse. Should you have questions after your visit or need to cancel or reschedule your appointment, please contact [facility]  [phone] and follow the prompts.  Office hours are 8:00 a.m. to 4:30 p.m. Monday - Friday. Please note that voicemails left after 4:00 p.m. may not be returned until the following business day.  We are closed weekends and major holidays. You have access to a nurse at all times for urgent questions. Please call the main number to the clinic [phone] and follow the prompts.  For any non-urgent questions, you may also contact your provider using MyChart. We now offer e-Visits for anyone 47 and older to request care online for non-urgent symptoms. For details visit [email or web address].   Also download the MyChart app! Go to the app store, search "MyChart", open the app, select [facility], and log in with your MyChart username and password.  Due to Covid, a mask is required upon entering the hospital/clinic. If you do not have a mask, one will be given to you upon arrival. For doctor visits, patients may have 1 support person aged 9 or older with them. For treatment visits, patients cannot have anyone with them due to current Covid guidelines and our immunocompromised population.

## 2021-11-13 NOTE — Progress Notes (Signed)
Patient refused to wait 1 hour post darzalex. Released stable and ASX.

## 2021-11-19 ENCOUNTER — Inpatient Hospital Stay: Payer: Medicare Other

## 2021-11-19 ENCOUNTER — Other Ambulatory Visit: Payer: Self-pay | Admitting: *Deleted

## 2021-11-19 ENCOUNTER — Inpatient Hospital Stay: Payer: Medicare Other | Attending: Family

## 2021-11-19 VITALS — BP 161/54 | HR 65 | Temp 98.1°F | Resp 17

## 2021-11-19 DIAGNOSIS — E1129 Type 2 diabetes mellitus with other diabetic kidney complication: Secondary | ICD-10-CM | POA: Diagnosis not present

## 2021-11-19 DIAGNOSIS — N189 Chronic kidney disease, unspecified: Secondary | ICD-10-CM | POA: Diagnosis not present

## 2021-11-19 DIAGNOSIS — Z79899 Other long term (current) drug therapy: Secondary | ICD-10-CM | POA: Diagnosis not present

## 2021-11-19 DIAGNOSIS — C9000 Multiple myeloma not having achieved remission: Secondary | ICD-10-CM

## 2021-11-19 DIAGNOSIS — Z5112 Encounter for antineoplastic immunotherapy: Secondary | ICD-10-CM | POA: Diagnosis not present

## 2021-11-19 DIAGNOSIS — E1122 Type 2 diabetes mellitus with diabetic chronic kidney disease: Secondary | ICD-10-CM | POA: Diagnosis not present

## 2021-11-19 DIAGNOSIS — I129 Hypertensive chronic kidney disease with stage 1 through stage 4 chronic kidney disease, or unspecified chronic kidney disease: Secondary | ICD-10-CM | POA: Diagnosis not present

## 2021-11-19 DIAGNOSIS — R809 Proteinuria, unspecified: Secondary | ICD-10-CM | POA: Diagnosis not present

## 2021-11-19 LAB — CBC WITH DIFFERENTIAL (CANCER CENTER ONLY)
Abs Immature Granulocytes: 0.02 10*3/uL (ref 0.00–0.07)
Basophils Absolute: 0 10*3/uL (ref 0.0–0.1)
Basophils Relative: 0 %
Eosinophils Absolute: 0.4 10*3/uL (ref 0.0–0.5)
Eosinophils Relative: 6 %
HCT: 40.1 % (ref 39.0–52.0)
Hemoglobin: 13.8 g/dL (ref 13.0–17.0)
Immature Granulocytes: 0 %
Lymphocytes Relative: 11 %
Lymphs Abs: 0.8 10*3/uL (ref 0.7–4.0)
MCH: 31.6 pg (ref 26.0–34.0)
MCHC: 34.4 g/dL (ref 30.0–36.0)
MCV: 91.8 fL (ref 80.0–100.0)
Monocytes Absolute: 0.8 10*3/uL (ref 0.1–1.0)
Monocytes Relative: 11 %
Neutro Abs: 5.2 10*3/uL (ref 1.7–7.7)
Neutrophils Relative %: 72 %
Platelet Count: 167 10*3/uL (ref 150–400)
RBC: 4.37 MIL/uL (ref 4.22–5.81)
RDW: 12.9 % (ref 11.5–15.5)
WBC Count: 7.3 10*3/uL (ref 4.0–10.5)
nRBC: 0 % (ref 0.0–0.2)

## 2021-11-19 LAB — CMP (CANCER CENTER ONLY)
ALT: 41 U/L (ref 0–44)
AST: 22 U/L (ref 15–41)
Albumin: 4.1 g/dL (ref 3.5–5.0)
Alkaline Phosphatase: 52 U/L (ref 38–126)
Anion gap: 7 (ref 5–15)
BUN: 25 mg/dL — ABNORMAL HIGH (ref 8–23)
CO2: 28 mmol/L (ref 22–32)
Calcium: 9 mg/dL (ref 8.9–10.3)
Chloride: 103 mmol/L (ref 98–111)
Creatinine: 1.55 mg/dL — ABNORMAL HIGH (ref 0.61–1.24)
GFR, Estimated: 46 mL/min — ABNORMAL LOW (ref >60)
Glucose, Bld: 205 mg/dL — ABNORMAL HIGH (ref 70–99)
Potassium: 4.1 mmol/L (ref 3.5–5.1)
Sodium: 138 mmol/L (ref 135–145)
Total Bilirubin: 0.4 mg/dL (ref 0.3–1.2)
Total Protein: 6.1 g/dL — ABNORMAL LOW (ref 6.5–8.1)

## 2021-11-19 MED ORDER — MONTELUKAST SODIUM 10 MG PO TABS
10.0000 mg | ORAL_TABLET | Freq: Once | ORAL | Status: AC
  Administered 2021-11-19: 10 mg via ORAL
  Filled 2021-11-19: qty 1

## 2021-11-19 MED ORDER — ACETAMINOPHEN 325 MG PO TABS
650.0000 mg | ORAL_TABLET | Freq: Once | ORAL | Status: AC
  Administered 2021-11-19: 650 mg via ORAL
  Filled 2021-11-19: qty 2

## 2021-11-19 MED ORDER — DIPHENHYDRAMINE HCL 25 MG PO CAPS
50.0000 mg | ORAL_CAPSULE | Freq: Once | ORAL | Status: AC
  Administered 2021-11-19: 50 mg via ORAL
  Filled 2021-11-19: qty 2

## 2021-11-19 MED ORDER — DEXAMETHASONE 4 MG PO TABS
20.0000 mg | ORAL_TABLET | Freq: Once | ORAL | Status: AC
  Administered 2021-11-19: 20 mg via ORAL
  Filled 2021-11-19: qty 5

## 2021-11-19 MED ORDER — DARATUMUMAB-HYALURONIDASE-FIHJ 1800-30000 MG-UT/15ML ~~LOC~~ SOLN
1800.0000 mg | Freq: Once | SUBCUTANEOUS | Status: AC
  Administered 2021-11-19: 1800 mg via SUBCUTANEOUS
  Filled 2021-11-19: qty 15

## 2021-11-19 NOTE — Patient Outreach (Signed)
[facility]) Care Management  11/19/2021  GRAHAM HYUN [birth year] [phone]  Unsuccessful outreach attempt made to patient. RN Health Coach left HIPAA compliant voicemail message along with her contact information.  Plan: RN Health Coach will call patient within the month of June.  Emelia Loron RN, BSN [place] [phone] [email or web address]

## 2021-11-19 NOTE — Patient Instructions (Signed)
[facility]  Discharge Instructions: Thank you for choosing [facility] to provide your oncology and hematology care.   If you have a lab appointment with the [facility], please go directly to the [facility] and check in at the registration area.  Wear comfortable clothing and clothing appropriate for easy access to any Portacath or PICC line.   We strive to give you quality time with your provider. You may need to reschedule your appointment if you arrive late (15 or more minutes).  Arriving late affects you and other patients whose appointments are after yours.  Also, if you miss three or more appointments without notifying the office, you may be dismissed from the clinic at the provider's discretion.      For prescription refill requests, have your pharmacy contact our office and allow 72 hours for refills to be completed.    Today you received the following chemotherapy and/or immunotherapy agents Darzalex.      To help prevent nausea and vomiting after your treatment, we encourage you to take your nausea medication as directed.  BELOW ARE SYMPTOMS THAT SHOULD BE REPORTED IMMEDIATELY: *FEVER GREATER THAN 100.4 F (38 C) OR HIGHER *CHILLS OR SWEATING *NAUSEA AND VOMITING THAT IS NOT CONTROLLED WITH YOUR NAUSEA MEDICATION *UNUSUAL SHORTNESS OF BREATH *UNUSUAL BRUISING OR BLEEDING *URINARY PROBLEMS (pain or burning when urinating, or frequent urination) *BOWEL PROBLEMS (unusual diarrhea, constipation, pain near the anus) TENDERNESS IN MOUTH AND THROAT WITH OR WITHOUT PRESENCE OF ULCERS (sore throat, sores in mouth, or a toothache) UNUSUAL RASH, SWELLING OR PAIN  UNUSUAL VAGINAL DISCHARGE OR ITCHING   Items with * indicate a potential emergency and should be followed up as soon as possible or go to the Emergency Department if any problems should occur.  Please show the CHEMOTHERAPY ALERT CARD or IMMUNOTHERAPY ALERT CARD at check-in to the  Emergency Department and triage nurse. Should you have questions after your visit or need to cancel or reschedule your appointment, please contact [facility]  [phone] and follow the prompts.  Office hours are 8:00 a.m. to 4:30 p.m. Monday - Friday. Please note that voicemails left after 4:00 p.m. may not be returned until the following business day.  We are closed weekends and major holidays. You have access to a nurse at all times for urgent questions. Please call the main number to the clinic [phone] and follow the prompts.  For any non-urgent questions, you may also contact your provider using MyChart. We now offer e-Visits for anyone 74 and older to request care online for non-urgent symptoms. For details visit [email or web address].   Also download the MyChart app! Go to the app store, search "MyChart", open the app, select [facility], and log in with your MyChart username and password.  Due to Covid, a mask is required upon entering the hospital/clinic. If you do not have a mask, one will be given to you upon arrival. For doctor visits, patients may have 1 support person aged 24 or older with them. For treatment visits, patients cannot have anyone with them due to current Covid guidelines and our immunocompromised population.

## 2021-11-23 ENCOUNTER — Other Ambulatory Visit: Payer: Self-pay | Admitting: *Deleted

## 2021-11-23 NOTE — Patient Outreach (Addendum)
[facility]) Care Management  11/23/2021  ESMERALDA BLANFORD [birth year] [id number]  Unsuccessful outreach attempt made to patient. RN Health Coach left HIPAA compliant voicemail message along with her contact information.  Plan: RN Health Coach will call patient within the month of June.  Emelia Loron RN, BSN [facility] [phone] [email or web address]

## 2021-11-25 ENCOUNTER — Telehealth: Payer: Self-pay

## 2021-11-25 DIAGNOSIS — C9000 Multiple myeloma not having achieved remission: Secondary | ICD-10-CM

## 2021-11-25 MED ORDER — LENALIDOMIDE 10 MG PO CAPS: 10.0000 mg | ORAL_CAPSULE | Freq: Every day | ORAL | 0 refills | Status: DC

## 2021-11-25 NOTE — Telephone Encounter (Signed)
Revlimid 10 mg # 21 refill. [employer] completed.

## 2021-11-26 ENCOUNTER — Inpatient Hospital Stay: Payer: Medicare Other

## 2021-11-26 ENCOUNTER — Other Ambulatory Visit: Payer: Self-pay | Admitting: Lab

## 2021-11-26 ENCOUNTER — Ambulatory Visit (HOSPITAL_BASED_OUTPATIENT_CLINIC_OR_DEPARTMENT_OTHER)
Admission: RE | Admit: 2021-11-26 | Discharge: 2021-11-26 | Disposition: A | Discharge status: Home / Self Care | Payer: Medicare Other | Source: Ambulatory Visit | Attending: Hematology & Oncology | Admitting: Hematology & Oncology

## 2021-11-26 ENCOUNTER — Inpatient Hospital Stay (HOSPITAL_BASED_OUTPATIENT_CLINIC_OR_DEPARTMENT_OTHER): Payer: Medicare Other | Admitting: Hematology & Oncology

## 2021-11-26 ENCOUNTER — Encounter: Payer: Self-pay | Admitting: Hematology & Oncology

## 2021-11-26 ENCOUNTER — Ambulatory Visit: Payer: Medicare Other | Admitting: Hematology & Oncology

## 2021-11-26 ENCOUNTER — Other Ambulatory Visit: Payer: Medicare Other

## 2021-11-26 ENCOUNTER — Ambulatory Visit: Payer: Medicare Other

## 2021-11-26 ENCOUNTER — Other Ambulatory Visit: Payer: Self-pay

## 2021-11-26 VITALS — BP 163/65 | HR 52

## 2021-11-26 VITALS — BP 136/55 | HR 57 | Temp 98.0°F | Resp 18 | Ht 68.0 in | Wt 169.0 lb

## 2021-11-26 DIAGNOSIS — Z5112 Encounter for antineoplastic immunotherapy: Secondary | ICD-10-CM | POA: Diagnosis not present

## 2021-11-26 DIAGNOSIS — Z79899 Other long term (current) drug therapy: Secondary | ICD-10-CM | POA: Diagnosis not present

## 2021-11-26 DIAGNOSIS — R0781 Pleurodynia: Secondary | ICD-10-CM | POA: Diagnosis not present

## 2021-11-26 DIAGNOSIS — C9000 Multiple myeloma not having achieved remission: Secondary | ICD-10-CM

## 2021-11-26 DIAGNOSIS — I700 Atherosclerosis of aorta: Secondary | ICD-10-CM | POA: Diagnosis not present

## 2021-11-26 LAB — CMP (CANCER CENTER ONLY)
ALT: 48 U/L — ABNORMAL HIGH (ref 0–44)
AST: 22 U/L (ref 15–41)
Albumin: 3.9 g/dL (ref 3.5–5.0)
Alkaline Phosphatase: 48 U/L (ref 38–126)
Anion gap: 6 (ref 5–15)
BUN: 24 mg/dL — ABNORMAL HIGH (ref 8–23)
CO2: 26 mmol/L (ref 22–32)
Calcium: 8.7 mg/dL — ABNORMAL LOW (ref 8.9–10.3)
Chloride: 105 mmol/L (ref 98–111)
Creatinine: 1.64 mg/dL — ABNORMAL HIGH (ref 0.61–1.24)
GFR, Estimated: 43 mL/min — ABNORMAL LOW (ref >60)
Glucose, Bld: 188 mg/dL — ABNORMAL HIGH (ref 70–99)
Potassium: 4.3 mmol/L (ref 3.5–5.1)
Sodium: 137 mmol/L (ref 135–145)
Total Bilirubin: 0.5 mg/dL (ref 0.3–1.2)
Total Protein: 5.8 g/dL — ABNORMAL LOW (ref 6.5–8.1)

## 2021-11-26 LAB — CBC WITH DIFFERENTIAL (CANCER CENTER ONLY)
Abs Immature Granulocytes: 0.01 10*3/uL (ref 0.00–0.07)
Basophils Absolute: 0.1 10*3/uL (ref 0.0–0.1)
Basophils Relative: 2 %
Eosinophils Absolute: 0.3 10*3/uL (ref 0.0–0.5)
Eosinophils Relative: 8 %
HCT: 38.4 % — ABNORMAL LOW (ref 39.0–52.0)
Hemoglobin: 13 g/dL (ref 13.0–17.0)
Immature Granulocytes: 0 %
Lymphocytes Relative: 13 %
Lymphs Abs: 0.5 10*3/uL — ABNORMAL LOW (ref 0.7–4.0)
MCH: 31.1 pg (ref 26.0–34.0)
MCHC: 33.9 g/dL (ref 30.0–36.0)
MCV: 91.9 fL (ref 80.0–100.0)
Monocytes Absolute: 0.5 10*3/uL (ref 0.1–1.0)
Monocytes Relative: 12 %
Neutro Abs: 2.6 10*3/uL (ref 1.7–7.7)
Neutrophils Relative %: 65 %
Platelet Count: 148 10*3/uL — ABNORMAL LOW (ref 150–400)
RBC: 4.18 MIL/uL — ABNORMAL LOW (ref 4.22–5.81)
RDW: 13.1 % (ref 11.5–15.5)
WBC Count: 3.9 10*3/uL — ABNORMAL LOW (ref 4.0–10.5)
nRBC: 0 % (ref 0.0–0.2)

## 2021-11-26 LAB — LACTATE DEHYDROGENASE: LDH: 171 U/L (ref 98–192)

## 2021-11-26 MED ORDER — DARATUMUMAB-HYALURONIDASE-FIHJ 1800-30000 MG-UT/15ML ~~LOC~~ SOLN
1800.0000 mg | Freq: Once | SUBCUTANEOUS | Status: AC
  Administered 2021-11-26: 1800 mg via SUBCUTANEOUS
  Filled 2021-11-26: qty 15

## 2021-11-26 MED ORDER — DEXAMETHASONE 4 MG PO TABS
20.0000 mg | ORAL_TABLET | Freq: Once | ORAL | Status: AC
  Administered 2021-11-26: 20 mg via ORAL
  Filled 2021-11-26: qty 5

## 2021-11-26 MED ORDER — BORTEZOMIB CHEMO SQ INJECTION 3.5 MG (2.5MG/ML)
1.3000 mg/m2 | Freq: Once | INTRAMUSCULAR | Status: AC
  Administered 2021-11-26: 2.5 mg via SUBCUTANEOUS
  Filled 2021-11-26: qty 1

## 2021-11-26 MED ORDER — DIPHENHYDRAMINE HCL 25 MG PO CAPS
50.0000 mg | ORAL_CAPSULE | Freq: Once | ORAL | Status: AC
  Administered 2021-11-26: 50 mg via ORAL
  Filled 2021-11-26: qty 2

## 2021-11-26 MED ORDER — ACETAMINOPHEN 325 MG PO TABS
650.0000 mg | ORAL_TABLET | Freq: Once | ORAL | Status: AC
  Administered 2021-11-26: 650 mg via ORAL
  Filled 2021-11-26: qty 2

## 2021-11-26 NOTE — Progress Notes (Signed)
Okay to treat today despite labs.

## 2021-11-26 NOTE — Progress Notes (Signed)
Hematology and Oncology Follow Up Visit  Marvin Salinas [id number] Sep 13, 1945 76 y.o. 11/26/2021   Principle Diagnosis:  Lambda light chain myeloma --normal cytogenetics  Current Therapy:   Faspro/Velcade/Revlimid -- s/p cycle #1 -- start on 11/05/2021     Interim History:  Marvin Salinas is back for follow-up.  We started him on treatment.  So far, he tolerated pretty well.  His only complaint has been some discomfort over on the right lower anterior chest wall.  This is at the rim of the rib cage.  Back in March, we did x-rays which did not show any bony metastasis.  Again, we will plan to repeat a x-ray to see if anything is going on.  He has had no cough or shortness of breath.  He has had no cough.  He has had no fever.  He has had no bleeding.  Otherwise, he is done well.  He has had no problems with the Revlimid.  He is taking Revlimid.  He has had no diarrhea.  There is been no rashes.  He has had no nausea or vomiting.  When we last saw him, his lambda light chain was 91 mg/dL.  Currently, I would have to say that his performance status is probably ECOG 1.    Medications:  Current Outpatient Medications:    BD INSULIN SYRINGE U/F 31G X 5/16" 0.5 ML MISC, E.11.9- monitor blood glucose tid for IDDM, Disp: 100 each, Rfl: 4   blood glucose meter kit and supplies KIT, Dispense based on patient and insurance preference. Use up to four times daily as directed., Disp: 1 each, Rfl: 0   Carboxymethylcell-Hypromellose (GENTEAL OP), Place 1 drop into both eyes daily as needed (dry eyes)., Disp: , Rfl:    dofetilide (TIKOSYN) 125 MCG capsule, TAKE 1 CAPSULE BY MOUTH TWICE A DAY, Disp: 180 capsule, Rfl: 3   ELIQUIS 5 MG TABS tablet, TAKE 1 TABLET BY MOUTH TWICE A DAY, Disp: 180 tablet, Rfl: 1   ezetimibe (ZETIA) 10 MG tablet, Take 1 tablet (10 mg total) by mouth daily., Disp: 90 tablet, Rfl: 3   famciclovir (FAMVIR) 250 MG tablet, Take 1 tablet (250 mg total) by mouth daily., Disp: 30 tablet, Rfl:  12   fluticasone (FLONASE) 50 MCG/ACT nasal spray, Place 2 sprays into both nostrils daily., Disp: , Rfl:    insulin glargine (LANTUS) 100 UNIT/ML injection, 30 units subcu injection in the morning and 35 units subcu injection before bed., Disp: 30 mL, Rfl: 5   Lancets (ONETOUCH DELICA PLUS PETKKO46X) MISC, USE UP TO FOUR TIMES DAILY AS DIRECTED **EMERGENCY FILL**, Disp: 100 each, Rfl: 11   lenalidomide (REVLIMID) 10 MG capsule, Take 1 capsule (10 mg total) by mouth daily. [employer] Auth # [id number]     Date Obtained 10/30/21, Disp: 21 capsule, Rfl: 0   loratadine (CLARITIN) 10 MG tablet, Take 10 mg by mouth daily as needed for allergies., Disp: , Rfl:    metoprolol tartrate (LOPRESSOR) 25 MG tablet, Take 0.5 tablets (12.5 mg total) by mouth 2 (two) times daily., Disp: 90 tablet, Rfl: 3   olmesartan (BENICAR) 40 MG tablet, Take 40 mg by mouth daily., Disp: , Rfl:    ondansetron (ZOFRAN) 8 MG tablet, Take 1 tablet (8 mg total) by mouth 2 (two) times daily as needed (Nausea or vomiting)., Disp: 30 tablet, Rfl: 1   ONETOUCH VERIO test strip, USE UP TO FOUR TIMES DAILY AS DIRECTED., Disp: 100 strip, Rfl: 3   polyethylene glycol powder (  GLYCOLAX/MIRALAX) 17 GM/SCOOP powder, Take by mouth., Disp: , Rfl:    prochlorperazine (COMPAZINE) 10 MG tablet, Take 1 tablet (10 mg total) by mouth every 6 (six) hours as needed (Nausea or vomiting)., Disp: 30 tablet, Rfl: 1   rosuvastatin (CRESTOR) 20 MG tablet, Take 1 tablet (20 mg total) by mouth at bedtime., Disp: 90 tablet, Rfl: 3   Semaglutide (RYBELSUS) 7 MG TABS, Take 1 tablet by mouth daily with breakfast., Disp: 90 tablet, Rfl: 1   nitroGLYCERIN (NITROSTAT) 0.4 MG SL tablet, PLACE 1 TABLET (0.4 MG TOTAL) UNDER THE TONGUE EVERY 5 (FIVE) MINUTES AS NEEDED FOR CHEST PAIN. (Patient not taking: Reported on 11/26/2021), Disp: 25 tablet, Rfl: 12  Allergies:  Allergies  Allergen Reactions   Codeine Nausea And Vomiting   Lisinopril Cough   Nsaids Other (See Comments)     CKD   Dapagliflozin Rash   Latex Hives    Past Medical History, Surgical history, Social history, and Family History were reviewed and updated.  Review of Systems: Review of Systems  Constitutional:  Positive for fatigue.  HENT:  Negative.    Eyes: Negative.   Respiratory: Negative.    Cardiovascular: Negative.   Gastrointestinal: Negative.   Endocrine: Negative.   Genitourinary: Negative.    Musculoskeletal: Negative.   Skin: Negative.   Neurological: Negative.   Hematological: Negative.   Psychiatric/Behavioral: Negative.      Physical Exam:  height is '5\' 8"'  (1.727 m) and weight is 169 lb (76.7 kg). His oral temperature is 98 F (36.7 C). His blood pressure is 136/55 (abnormal) and his pulse is 57 (abnormal). His respiration is 18 and oxygen saturation is 99%.   Wt Readings from Last 3 Encounters:  11/26/21 169 lb (76.7 kg)  10/28/21 168 lb (76.2 kg)  10/28/21 168 lb (76.2 kg)    Physical Exam Vitals reviewed.  HENT:     Head: Normocephalic and atraumatic.  Eyes:     Pupils: Pupils are equal, round, and reactive to light.  Cardiovascular:     Rate and Rhythm: Normal rate and regular rhythm.     Heart sounds: Normal heart sounds.  Pulmonary:     Effort: Pulmonary effort is normal.     Breath sounds: Normal breath sounds.  Abdominal:     General: Bowel sounds are normal.     Palpations: Abdomen is soft.  Musculoskeletal:        General: No tenderness or deformity. Normal range of motion.     Cervical back: Normal range of motion.  Lymphadenopathy:     Cervical: No cervical adenopathy.  Skin:    General: Skin is warm and dry.     Findings: No erythema or rash.  Neurological:     Mental Status: He is alert and oriented to person, place, and time.  Psychiatric:        Behavior: Behavior normal.        Thought Content: Thought content normal.        Judgment: Judgment normal.      Lab Results  Component Value Date   WBC 3.9 (L) 11/26/2021   HGB 13.0  11/26/2021   HCT 38.4 (L) 11/26/2021   MCV 91.9 11/26/2021   PLT 148 (L) 11/26/2021     Chemistry      Component Value Date/Time   NA 137 11/26/2021 1030   NA 140 08/31/2021 1026   K 4.3 11/26/2021 1030   CL 105 11/26/2021 1030   CO2 26 11/26/2021 1030  BUN 24 (H) 11/26/2021 1030   BUN 21 08/31/2021 1026   CREATININE 1.64 (H) 11/26/2021 1030      Component Value Date/Time   CALCIUM 8.7 (L) 11/26/2021 1030   ALKPHOS 48 11/26/2021 1030   AST 22 11/26/2021 1030   ALT 48 (H) 11/26/2021 1030   BILITOT 0.5 11/26/2021 1030      Impression and Plan: Mr. Pretlow is a very nice 76 year old white male.  He has lambda light chain myeloma.  He has mild renal insufficiency.  He does not have any bone lesions.  He is not hypercalcemic.  We will go ahead with his second cycle of treatment.  Hopefully, we will see that his light chains coming down nicely.  I am not sure what this pain might be.  I do not think it is shingles.  I do not see a rash.  He has no pulmonary symptoms.  As such, I do not think this is a thromboembolic event.  We will go ahead and get a plain film of the rib cage.  We will see how that looks.  Otherwise, we will continue him on protocol.  He will start his second cycle today.  We will have him come back in 1 month for his third cycle and at that point, the [facility] will be every other week.    Volanda Napoleon, MD 6/8/202311:50 AM

## 2021-11-26 NOTE — Patient Instructions (Signed)
[facility]  Discharge Instructions: Thank you for choosing [facility] to provide your oncology and hematology care.   If you have a lab appointment with the Cancer Center, please go directly to the Cancer Center and check in at the registration area.  Wear comfortable clothing and clothing appropriate for easy access to any Portacath or PICC line.   We strive to give you quality time with your provider. You may need to reschedule your appointment if you arrive late (15 or more minutes).  Arriving late affects you and other patients whose appointments are after yours.  Also, if you miss three or more appointments without notifying the office, you may be dismissed from the clinic at the provider's discretion.      For prescription refill requests, have your pharmacy contact our office and allow 72 hours for refills to be completed.    Today you received the following chemotherapy and/or immunotherapy agents darzalex, velcade   To help prevent nausea and vomiting after your treatment, we encourage you to take your nausea medication as directed.  BELOW ARE SYMPTOMS THAT SHOULD BE REPORTED IMMEDIATELY: *FEVER GREATER THAN 100.4 F (38 C) OR HIGHER *CHILLS OR SWEATING *NAUSEA AND VOMITING THAT IS NOT CONTROLLED WITH YOUR NAUSEA MEDICATION *UNUSUAL SHORTNESS OF BREATH *UNUSUAL BRUISING OR BLEEDING *URINARY PROBLEMS (pain or burning when urinating, or frequent urination) *BOWEL PROBLEMS (unusual diarrhea, constipation, pain near the anus) TENDERNESS IN MOUTH AND THROAT WITH OR WITHOUT PRESENCE OF ULCERS (sore throat, sores in mouth, or a toothache) UNUSUAL RASH, SWELLING OR PAIN  UNUSUAL VAGINAL DISCHARGE OR ITCHING   Items with * indicate a potential emergency and should be followed up as soon as possible or go to the Emergency Department if any problems should occur.  Please show the CHEMOTHERAPY ALERT CARD or IMMUNOTHERAPY ALERT CARD at check-in to  the Emergency Department and triage nurse. Should you have questions after your visit or need to cancel or reschedule your appointment, please contact [facility]  [phone] and follow the prompts.  Office hours are 8:00 a.m. to 4:30 p.m. Monday - Friday. Please note that voicemails left after 4:00 p.m. may not be returned until the following business day.  We are closed weekends and major holidays. You have access to a nurse at all times for urgent questions. Please call the main number to the clinic [phone] and follow the prompts.  For any non-urgent questions, you may also contact your provider using MyChart. We now offer e-Visits for anyone 18 and older to request care online for non-urgent symptoms. For details visit [email or web address].   Also download the MyChart app! Go to the app store, search "MyChart", open the app, select [facility], and log in with your MyChart username and password.  Due to Covid, a mask is required upon entering the hospital/clinic. If you do not have a mask, one will be given to you upon arrival. For doctor visits, patients may have 1 support person aged 18 or older with them. For treatment visits, patients cannot have anyone with them due to current Covid guidelines and our immunocompromised population.  

## 2021-11-26 NOTE — Progress Notes (Signed)
OK to treat today with creat-1.64 per order of Dr. Marin Olp.

## 2021-11-27 LAB — IGG, IGA, IGM
IgA: 60 mg/dL — ABNORMAL LOW (ref 61–437)
IgG (Immunoglobin G), Serum: 368 mg/dL — ABNORMAL LOW (ref 603–1613)
IgM (Immunoglobulin M), Srm: 11 mg/dL — ABNORMAL LOW (ref 15–143)

## 2021-11-27 LAB — KAPPA/LAMBDA LIGHT CHAINS
Kappa free light chain: 23.7 mg/L — ABNORMAL HIGH (ref 3.3–19.4)
Kappa, lambda light chain ratio: 0.1 — ABNORMAL LOW (ref 0.26–1.65)
Lambda free light chains: 226.8 mg/L — ABNORMAL HIGH (ref 5.7–26.3)

## 2021-11-27 LAB — BETA 2 MICROGLOBULIN, SERUM: Beta-2 Microglobulin: 3.3 mg/L — ABNORMAL HIGH (ref 0.6–2.4)

## 2021-11-30 ENCOUNTER — Encounter: Payer: Self-pay | Admitting: *Deleted

## 2021-12-02 LAB — PROTEIN ELECTROPHORESIS, SERUM, WITH REFLEX
A/G Ratio: 1.6 (ref 0.7–1.7)
Albumin ELP: 3.5 g/dL (ref 2.9–4.4)
Alpha-1-Globulin: 0.2 g/dL (ref 0.0–0.4)
Alpha-2-Globulin: 0.8 g/dL (ref 0.4–1.0)
Beta Globulin: 0.8 g/dL (ref 0.7–1.3)
Gamma Globulin: 0.4 g/dL (ref 0.4–1.8)
Globulin, Total: 2.2 g/dL (ref 2.2–3.9)
M-Spike, %: 0.1 g/dL — ABNORMAL HIGH
SPEP Interpretation: 0
Total Protein ELP: 5.7 g/dL — ABNORMAL LOW (ref 6.0–8.5)

## 2021-12-02 LAB — IMMUNOFIXATION REFLEX, SERUM
IgA: 67 mg/dL (ref 61–437)
IgG (Immunoglobin G), Serum: 401 mg/dL — ABNORMAL LOW (ref 603–1613)
IgM (Immunoglobulin M), Srm: 13 mg/dL — ABNORMAL LOW (ref 15–143)

## 2021-12-09 ENCOUNTER — Other Ambulatory Visit: Payer: Self-pay | Admitting: *Deleted

## 2021-12-09 NOTE — Patient Instructions (Signed)
Visit Information  Thank you for taking time to visit with me today. Please don't hesitate to contact me if I can be of assistance to you before our next scheduled telephone appointment.  Following are the goals we discussed today:   Take all medications as prescribed Attend all scheduled provider appointments Call provider office for new concerns or questions  keep appointment with eye doctor check blood sugar at prescribed times: twice daily check feet daily for cuts, sores or redness enter blood sugar readings and medication or insulin into daily log take the blood sugar log to all doctor visits set goal weight trim toenails straight across drink 6 to 8 glasses of water each day fill half of plate with vegetables limit fast food meals to no more than 1 per week manage portion size prepare main meal at home 3 to 5 days each week wear comfortable, well-fitting shoes Continue to stay physically active by not sitting for long periods and doing chair exercises as tolerated Continue to limit sugar, carbohydrates, and sodium in your diet Eat a bedtime snack to prevent low blood sugar Keep compazine in your system as needed for nausea to maintain your nutrition Drink plenty of fluids to stay hydrated; limit sugary fluids due to diabetes Contact oncology for chemotherapy side effects, questions or concerns  The patient verbalized understanding of instructions, educational materials, and care plan provided today and agreed to receive a mailed copy of patient instructions, educational materials, and care plan.   Telephone follow up appointment with care management team member scheduled for: August  Emelia Loron RN, [facility] [phone] [email or web address]

## 2021-12-09 NOTE — Patient Outreach (Signed)
[facility]) Care Management  12/09/2021  Marvin Salinas Mar 30, 1946 [id number]  [facility]) Care Management RN Health Coach Note   12/09/2021 Name:  Marvin Salinas MRN:  [id number] DOB:  Jan 06, 1946  Summary: Patient reports tolerating chemotherapy for myeloma well. He states he has had a little nausea which is controlled with prescribed antiemetics. Nurse discussed nutrition, hydration, and the importance of contacting oncology quickly for uncontrolled side effects, questions, and concerns. Patient states emotionally is dealing with the diagnosis of cancer better. He is feeling well supported by his family. Patient states his blood sugar has been more elevated in the evening. He thinks he may be taking it  too soon after his meals. Nurse provided diabetic education. Patient's A1c decreased to 7.6 from 8.4 on 10/28/21. Patient did not have any further questions or concerns today and did confirm that he has this nurse's contact number to call her if needed.   Recommendations/Changes made from today's visit: Continue to stay physically active by not sitting for long periods and doing chair exercises as tolerated Continue to limit sugar, carbohydrates, and sodium in your diet Eat a bedtime snack to prevent low blood sugar Keep compazine in your system as needed for nausea to maintain your nutrition Drink plenty of fluids to stay hydrated; limit sugary fluids due to diabetes Contact oncology for chemotherapy side effects, questions or concerns  Subjective: Marvin Salinas is an 76 y.o. year old male who is a primary patient of Kuneff, Renee A, DO. The care management team was consulted for assistance with care management and/or care coordination needs.    RN Health Coach completed Telephone Visit today.   Objective:  Medications Reviewed Today     Reviewed by Michiel Cowboy, RN (Registered Nurse) on 12/09/21 at 1049  Med List Status: <None>   Medication Order  Taking? Sig Documenting Provider Last Dose Status Informant  BD INSULIN SYRINGE U/F 31G X 5/16" 0.5 ML MISC [id number] Yes E.11.9- monitor blood glucose tid for IDDM Kuneff, Renee A, DO Taking Active   blood glucose meter kit and supplies KIT [id number] Yes Dispense based on patient and insurance preference. Use up to four times daily as directed. Raoul Pitch, Renee A, DO Taking Active   Carboxymethylcell-Hypromellose (GENTEAL OP) [id number] Yes Place 1 drop into both eyes daily as needed (dry eyes). [provider] Taking Active Self  dofetilide (TIKOSYN) 125 MCG capsule [id number] Yes TAKE 1 CAPSULE BY MOUTH TWICE A DAY [name], Wonda Cheng, MD Taking Active   ELIQUIS 5 MG TABS tablet [id number] Yes TAKE 1 TABLET BY MOUTH TWICE A DAY [name], Wonda Cheng, MD Taking Active   ezetimibe (ZETIA) 10 MG tablet [id number] Yes Take 1 tablet (10 mg total) by mouth daily. Raoul Pitch, Renee A, DO Taking Active   famciclovir (FAMVIR) 250 MG tablet [id number] Yes Take 1 tablet (250 mg total) by mouth daily. Volanda Napoleon, MD Taking Active   fluticasone [facility]) 50 MCG/ACT nasal spray [id number] Yes Place 2 sprays into both nostrils daily. [provider] Taking Active   insulin glargine (LANTUS) 100 UNIT/ML injection [id number] Yes 30 units subcu injection in the morning and 35 units subcu injection before bed. Howard Pouch A, DO Taking Active   Lancets (ONETOUCH DELICA PLUS [id number]) MISC [id number] Yes USE UP TO FOUR TIMES DAILY AS DIRECTED **EMERGENCY FILL** Kuneff, Renee A, DO Taking Active   lenalidomide (REVLIMID) 10 MG capsule [id number] Yes Take 1 capsule (10 mg total) by mouth daily. [employer]  Josem Kaufmann # [id number]     Date Obtained 10/30/21 Celso Amy, NP Taking Active   loratadine (CLARITIN) 10 MG tablet [id number] Yes Take 10 mg by mouth daily as needed for allergies. [provider] Taking Active Self  metoprolol tartrate (LOPRESSOR) 25 MG tablet [id number] Yes Take 0.5 tablets (12.5 mg total) by mouth  2 (two) times daily. [name], Wonda Cheng, MD Taking Active   nitroGLYCERIN (NITROSTAT) 0.4 MG SL tablet [id number] Yes PLACE 1 TABLET (0.4 MG TOTAL) UNDER THE TONGUE EVERY 5 (FIVE) MINUTES AS NEEDED FOR CHEST PAIN. Liliane Shi, PA-C Taking Active            Med Note Hassell Done, LINDA   Mon Mar 02, 2021  8:04 AM)    olmesartan (BENICAR) 40 MG tablet [id number] Yes Take 40 mg by mouth daily. [provider] Taking Active            Med Note Lynn Ito   Mon Aug 31, 2021  9:29 AM)    ondansetron (ZOFRAN) 8 MG tablet [id number] Yes Take 1 tablet (8 mg total) by mouth 2 (two) times daily as needed (Nausea or vomiting). Volanda Napoleon, MD Taking Active   [facility] VERIO test strip [id number] Yes USE UP TO FOUR TIMES DAILY AS DIRECTED. Tammi Sou, MD Taking Active   polyethylene glycol powder (GLYCOLAX/MIRALAX) 17 GM/SCOOP powder [id number] Yes Take by mouth. [provider] Taking Active   prochlorperazine (COMPAZINE) 10 MG tablet [id number] Yes Take 1 tablet (10 mg total) by mouth every 6 (six) hours as needed (Nausea or vomiting). Volanda Napoleon, MD Taking Active   rosuvastatin (CRESTOR) 20 MG tablet [id number] Yes Take 1 tablet (20 mg total) by mouth at bedtime. Raoul Pitch, Renee A, DO Taking Active   Semaglutide (RYBELSUS) 7 MG TABS [id number] Yes Take 1 tablet by mouth daily with breakfast. Ma Hillock, DO Taking Active   Med List Note Britt Boozer, [facility] [address] 11/02/21 1206): Revlimid filled through [facility]:  (Social Determinants of Health) assessments and interventions performed:  SDOH Interventions    Flowsheet Row Most Recent Value  SDOH Interventions   Depression Interventions/Treatment  --  [patient states he does not like to take medication and that he is feeling well supported by his family. Emotions concerning cancer diagnosis and treatment is getting better]       Care Plan  Review of patient past medical history,  allergies, medications, health status, including review of consultants reports, laboratory and other test data, was performed as part of comprehensive evaluation for care management services.   Care Plan : RN Care Manager Plan of Care  Updates made by Michiel Cowboy, RN since 12/09/2021 12:00 AM     Problem: Knowledge Deficit Related to  Diabetes and chemotherapy   Priority: High     Long-Range Goal: Development of Plan of Care for Management of Diabetes and chemotherapy   Start Date: 06/29/2021  Expected End Date: 07/20/2021  Priority: High  Note:   Current Barriers:  Chronic Disease Management support and education needs related to DMII  Chemotherapy  RNCM Clinical Goal(s):  Patient will demonstrate Ongoing adherence to prescribed treatment plan for DMII as evidenced by decreasing A1c by .2-.3 within the next 90 days  through collaboration with RN Care manager, provider, and care team.   Interventions: Inter-disciplinary care team collaboration (see longitudinal plan of care) Evaluation of current treatment plan  related to  self management and patient's adherence to plan as established by provider   Diabetes Interventions:  (Status:  Goal on track:  Yes.) Long Term Goal Assessed patient's understanding of A1c goal: <7% Patient reports his blood sugars are better and he is working towards lowering his A1c Provided education to patient about basic DM disease process Reviewed medications with patient and discussed importance of medication adherence Counseled on importance of regular laboratory monitoring as prescribed Discussed plans with patient for ongoing care management follow up and provided patient with direct contact information for care management team Provided patient with written educational materials related to hypo and hyperglycemia and importance of correct treatment Advised patient, providing education and rationale, to check cbg 2 and record, calling PCP for findings outside  established parameters Review of patient status, including review of consultants reports, relevant laboratory and other test results, and medications completed Assessed social determinant of health barriers Encouraged continuation of a eating a heart healthy diabetic diet Encouraged patient not to sit for long periods and to do chair exercises as tolerated Discussed continuation of eating a HS snack to prevent hypoglycemia  Lab Results  Component Value Date   HGBA1C 7.6 (A) 10/28/2021   HGBA1C 7.6 10/28/2021   HGBA1C 7.6 (A) 10/28/2021   HGBA1C 7.6 (A) 10/28/2021   Oncology:  (Status: New goal.) Long Term Goal Assessment of understanding of oncology diagnosis:  Assessed patient understanding of cancer diagnosis and recommended treatment plan, Reviewed upcoming provider appointments and treatment appointments, Assessed available transportation to appointments and treatments. Has consistent/reliable transportation: Yes, Assessed support system. Has consistent/reliable family or other support: Yes, PHQ2/PHQ9 performed, and Nutrition assessment performed Discussed taking antiemetic as needed to maintain nutrition Discussed staying hydrated; limit sugary fluids due to diabetes Discussed contacting oncology for side effects, questions, or concerns  Patient Goals/Self-Care Activities: Take all medications as prescribed Attend all scheduled provider appointments Call provider office for new concerns or questions  keep appointment with eye doctor check blood sugar at prescribed times: twice daily check feet daily for cuts, sores or redness enter blood sugar readings and medication or insulin into daily log take the blood sugar log to all doctor visits set goal weight trim toenails straight across drink 6 to 8 glasses of water each day fill half of plate with vegetables limit fast food meals to no more than 1 per week manage portion size prepare main meal at home 3 to 5 days each week wear  comfortable, well-fitting shoes Continue to stay physically active by not sitting for long periods and doing chair exercises as tolerated Continue to limit sugar, carbohydrates, and sodium in your diet Eat a bedtime snack to prevent low blood sugar Keep compazine in your system as needed for nausea to maintain your nutrition Drink plenty of fluids to stay hydrated; limit sugary fluids due to diabetes Contact oncology for chemotherapy side effects, questions or concerns  Follow Up Plan:  Telephone follow up appointment with care management team member scheduled for:  August       Plan: Telephone follow up appointment with care management team member scheduled for:  August. Nurse will send PCP   Emelia Loron RN, [place] [phone] [email or web address]

## 2021-12-11 ENCOUNTER — Other Ambulatory Visit: Payer: Self-pay | Admitting: *Deleted

## 2021-12-11 DIAGNOSIS — C9000 Multiple myeloma not having achieved remission: Secondary | ICD-10-CM

## 2021-12-11 MED ORDER — LENALIDOMIDE 10 MG PO CAPS: 10.0000 mg | ORAL_CAPSULE | Freq: Every day | ORAL | 0 refills | Status: DC

## 2021-12-15 ENCOUNTER — Other Ambulatory Visit: Payer: Self-pay | Admitting: Hematology & Oncology

## 2021-12-15 NOTE — Progress Notes (Signed)
Deleting days 8 and 15 of cycle 2 per Dr. Gustavo Lah instructions. Will resume on 12/17/21 with Cycle 3, day 1.

## 2021-12-16 ENCOUNTER — Inpatient Hospital Stay: Payer: Medicare Other

## 2021-12-16 VITALS — BP 122/51 | HR 51 | Temp 97.6°F | Resp 16

## 2021-12-16 DIAGNOSIS — C9000 Multiple myeloma not having achieved remission: Secondary | ICD-10-CM | POA: Diagnosis not present

## 2021-12-16 DIAGNOSIS — Z79899 Other long term (current) drug therapy: Secondary | ICD-10-CM | POA: Diagnosis not present

## 2021-12-16 DIAGNOSIS — Z5112 Encounter for antineoplastic immunotherapy: Secondary | ICD-10-CM | POA: Diagnosis not present

## 2021-12-16 LAB — CBC WITH DIFFERENTIAL (CANCER CENTER ONLY)
Abs Immature Granulocytes: 0.03 10*3/uL (ref 0.00–0.07)
Basophils Absolute: 0.2 10*3/uL — ABNORMAL HIGH (ref 0.0–0.1)
Basophils Relative: 3 %
Eosinophils Absolute: 0.3 10*3/uL (ref 0.0–0.5)
Eosinophils Relative: 5 %
HCT: 37 % — ABNORMAL LOW (ref 39.0–52.0)
Hemoglobin: 12.4 g/dL — ABNORMAL LOW (ref 13.0–17.0)
Immature Granulocytes: 0 %
Lymphocytes Relative: 26 %
Lymphs Abs: 1.9 10*3/uL (ref 0.7–4.0)
MCH: 30.5 pg (ref 26.0–34.0)
MCHC: 33.5 g/dL (ref 30.0–36.0)
MCV: 91.1 fL (ref 80.0–100.0)
Monocytes Absolute: 0.9 10*3/uL (ref 0.1–1.0)
Monocytes Relative: 12 %
Neutro Abs: 3.9 10*3/uL (ref 1.7–7.7)
Neutrophils Relative %: 54 %
Platelet Count: 214 10*3/uL (ref 150–400)
RBC: 4.06 MIL/uL — ABNORMAL LOW (ref 4.22–5.81)
RDW: 13.6 % (ref 11.5–15.5)
WBC Count: 7.3 10*3/uL (ref 4.0–10.5)
nRBC: 0 % (ref 0.0–0.2)

## 2021-12-16 LAB — CMP (CANCER CENTER ONLY)
ALT: 61 U/L — ABNORMAL HIGH (ref 0–44)
AST: 37 U/L (ref 15–41)
Albumin: 3.8 g/dL (ref 3.5–5.0)
Alkaline Phosphatase: 54 U/L (ref 38–126)
Anion gap: 7 (ref 5–15)
BUN: 29 mg/dL — ABNORMAL HIGH (ref 8–23)
CO2: 28 mmol/L (ref 22–32)
Calcium: 9.1 mg/dL (ref 8.9–10.3)
Chloride: 103 mmol/L (ref 98–111)
Creatinine: 1.47 mg/dL — ABNORMAL HIGH (ref 0.61–1.24)
GFR, Estimated: 49 mL/min — ABNORMAL LOW (ref >60)
Glucose, Bld: 138 mg/dL — ABNORMAL HIGH (ref 70–99)
Potassium: 4.3 mmol/L (ref 3.5–5.1)
Sodium: 138 mmol/L (ref 135–145)
Total Bilirubin: 0.5 mg/dL (ref 0.3–1.2)
Total Protein: 5.8 g/dL — ABNORMAL LOW (ref 6.5–8.1)

## 2021-12-16 MED ORDER — DARATUMUMAB-HYALURONIDASE-FIHJ 1800-30000 MG-UT/15ML ~~LOC~~ SOLN
1800.0000 mg | Freq: Once | SUBCUTANEOUS | Status: AC
  Administered 2021-12-16: 1800 mg via SUBCUTANEOUS
  Filled 2021-12-16: qty 15

## 2021-12-16 MED ORDER — DEXAMETHASONE 4 MG PO TABS
20.0000 mg | ORAL_TABLET | Freq: Once | ORAL | Status: AC
  Administered 2021-12-16: 20 mg via ORAL
  Filled 2021-12-16: qty 5

## 2021-12-16 MED ORDER — ACETAMINOPHEN 325 MG PO TABS
650.0000 mg | ORAL_TABLET | Freq: Once | ORAL | Status: AC
  Administered 2021-12-16: 650 mg via ORAL
  Filled 2021-12-16: qty 2

## 2021-12-16 MED ORDER — DIPHENHYDRAMINE HCL 25 MG PO CAPS
50.0000 mg | ORAL_CAPSULE | Freq: Once | ORAL | Status: AC
  Administered 2021-12-16: 50 mg via ORAL
  Filled 2021-12-16: qty 2

## 2021-12-16 MED ORDER — BORTEZOMIB CHEMO SQ INJECTION 3.5 MG (2.5MG/ML)
1.3000 mg/m2 | Freq: Once | INTRAMUSCULAR | Status: AC
  Administered 2021-12-16: 2.5 mg via SUBCUTANEOUS
  Filled 2021-12-16: qty 1

## 2021-12-16 NOTE — Patient Instructions (Signed)
[facility] [place]  Discharge Instructions: Thank you for choosing [facility] to provide your oncology and hematology care.   If you have a lab appointment with the [facility], please go directly to the [facility] and check in at the registration area.  Wear comfortable clothing and clothing appropriate for easy access to any Portacath or PICC line.   We strive to give you quality time with your provider. You may need to reschedule your appointment if you arrive late (15 or more minutes).  Arriving late affects you and other patients whose appointments are after yours.  Also, if you miss three or more appointments without notifying the office, you may be dismissed from the clinic at the provider's discretion.      For prescription refill requests, have your pharmacy contact our office and allow 72 hours for refills to be completed.    Today you received the following chemotherapy and/or immunotherapy agents Velcade, Darzalex       To help prevent nausea and vomiting after your treatment, we encourage you to take your nausea medication as directed.  BELOW ARE SYMPTOMS THAT SHOULD BE REPORTED IMMEDIATELY: *FEVER GREATER THAN 100.4 F (38 C) OR HIGHER *CHILLS OR SWEATING *NAUSEA AND VOMITING THAT IS NOT CONTROLLED WITH YOUR NAUSEA MEDICATION *UNUSUAL SHORTNESS OF BREATH *UNUSUAL BRUISING OR BLEEDING *URINARY PROBLEMS (pain or burning when urinating, or frequent urination) *BOWEL PROBLEMS (unusual diarrhea, constipation, pain near the anus) TENDERNESS IN MOUTH AND THROAT WITH OR WITHOUT PRESENCE OF ULCERS (sore throat, sores in mouth, or a toothache) UNUSUAL RASH, SWELLING OR PAIN  UNUSUAL VAGINAL DISCHARGE OR ITCHING   Items with * indicate a potential emergency and should be followed up as soon as possible or go to the Emergency Department if any problems should occur.  Please show the CHEMOTHERAPY ALERT CARD or IMMUNOTHERAPY ALERT CARD at  check-in to the Emergency Department and triage nurse. Should you have questions after your visit or need to cancel or reschedule your appointment, please contact [facility]  [phone] and follow the prompts.  Office hours are 8:00 a.m. to 4:30 p.m. Monday - Friday. Please note that voicemails left after 4:00 p.m. may not be returned until the following business day.  We are closed weekends and major holidays. You have access to a nurse at all times for urgent questions. Please call the main number to the clinic [phone] and follow the prompts.  For any non-urgent questions, you may also contact your provider using MyChart. We now offer e-Visits for anyone 61 and older to request care online for non-urgent symptoms. For details visit [email or web address].   Also download the MyChart app! Go to the app store, search "MyChart", open the app, select [facility], and log in with your MyChart username and password.  Masks are optional in the cancer centers. If you would like for your care team to wear a mask while they are taking care of you, please let them know. For doctor visits, patients may have with them one support person who is at least 77 years old. At this time, visitors are not allowed in the infusion area.

## 2021-12-18 ENCOUNTER — Ambulatory Visit (HOSPITAL_BASED_OUTPATIENT_CLINIC_OR_DEPARTMENT_OTHER)
Admission: RE | Admit: 2021-12-18 | Discharge: 2021-12-18 | Disposition: A | Discharge status: Home / Self Care | Payer: Medicare Other | Source: Ambulatory Visit | Attending: Cardiovascular Disease | Admitting: Cardiovascular Disease

## 2021-12-18 ENCOUNTER — Ambulatory Visit (HOSPITAL_COMMUNITY)
Admission: RE | Admit: 2021-12-18 | Discharge: 2021-12-18 | Disposition: A | Discharge status: Home / Self Care | Payer: Medicare Other | Source: Ambulatory Visit | Attending: Cardiovascular Disease | Admitting: Cardiovascular Disease

## 2021-12-18 DIAGNOSIS — I739 Peripheral vascular disease, unspecified: Secondary | ICD-10-CM | POA: Diagnosis not present

## 2021-12-18 DIAGNOSIS — Z95828 Presence of other vascular implants and grafts: Secondary | ICD-10-CM

## 2021-12-23 ENCOUNTER — Other Ambulatory Visit: Payer: Self-pay | Admitting: *Deleted

## 2021-12-23 DIAGNOSIS — I739 Peripheral vascular disease, unspecified: Secondary | ICD-10-CM

## 2021-12-24 ENCOUNTER — Other Ambulatory Visit: Payer: Self-pay

## 2021-12-24 ENCOUNTER — Inpatient Hospital Stay: Payer: Medicare Other

## 2021-12-24 ENCOUNTER — Inpatient Hospital Stay: Payer: Medicare Other | Attending: Family

## 2021-12-24 ENCOUNTER — Encounter: Payer: Self-pay | Admitting: Hematology & Oncology

## 2021-12-24 ENCOUNTER — Inpatient Hospital Stay: Payer: Medicare Other | Admitting: Hematology & Oncology

## 2021-12-24 VITALS — BP 165/56 | HR 53 | Temp 97.8°F | Resp 19 | Wt 169.0 lb

## 2021-12-24 DIAGNOSIS — Z79899 Other long term (current) drug therapy: Secondary | ICD-10-CM | POA: Insufficient documentation

## 2021-12-24 DIAGNOSIS — C9000 Multiple myeloma not having achieved remission: Secondary | ICD-10-CM

## 2021-12-24 DIAGNOSIS — Z5112 Encounter for antineoplastic immunotherapy: Secondary | ICD-10-CM | POA: Insufficient documentation

## 2021-12-24 LAB — CBC WITH DIFFERENTIAL (CANCER CENTER ONLY)
Abs Immature Granulocytes: 0.02 10*3/uL (ref 0.00–0.07)
Basophils Absolute: 0.1 10*3/uL (ref 0.0–0.1)
Basophils Relative: 1 %
Eosinophils Absolute: 0.8 10*3/uL — ABNORMAL HIGH (ref 0.0–0.5)
Eosinophils Relative: 13 %
HCT: 38.9 % — ABNORMAL LOW (ref 39.0–52.0)
Hemoglobin: 13 g/dL (ref 13.0–17.0)
Immature Granulocytes: 0 %
Lymphocytes Relative: 32 %
Lymphs Abs: 1.8 10*3/uL (ref 0.7–4.0)
MCH: 31 pg (ref 26.0–34.0)
MCHC: 33.4 g/dL (ref 30.0–36.0)
MCV: 92.6 fL (ref 80.0–100.0)
Monocytes Absolute: 0.7 10*3/uL (ref 0.1–1.0)
Monocytes Relative: 12 %
Neutro Abs: 2.5 10*3/uL (ref 1.7–7.7)
Neutrophils Relative %: 42 %
Platelet Count: 147 10*3/uL — ABNORMAL LOW (ref 150–400)
RBC: 4.2 MIL/uL — ABNORMAL LOW (ref 4.22–5.81)
RDW: 14 % (ref 11.5–15.5)
Smear Review: NORMAL
WBC Count: 5.8 10*3/uL (ref 4.0–10.5)
nRBC: 0 % (ref 0.0–0.2)

## 2021-12-24 LAB — CMP (CANCER CENTER ONLY)
ALT: 33 U/L (ref 0–44)
AST: 21 U/L (ref 15–41)
Albumin: 4.3 g/dL (ref 3.5–5.0)
Alkaline Phosphatase: 59 U/L (ref 38–126)
Anion gap: 6 (ref 5–15)
BUN: 20 mg/dL (ref 8–23)
CO2: 30 mmol/L (ref 22–32)
Calcium: 9.4 mg/dL (ref 8.9–10.3)
Chloride: 102 mmol/L (ref 98–111)
Creatinine: 1.39 mg/dL — ABNORMAL HIGH (ref 0.61–1.24)
GFR, Estimated: 53 mL/min — ABNORMAL LOW (ref >60)
Glucose, Bld: 106 mg/dL — ABNORMAL HIGH (ref 70–99)
Potassium: 4 mmol/L (ref 3.5–5.1)
Sodium: 138 mmol/L (ref 135–145)
Total Bilirubin: 0.6 mg/dL (ref 0.3–1.2)
Total Protein: 6.6 g/dL (ref 6.5–8.1)

## 2021-12-24 LAB — LACTATE DEHYDROGENASE: LDH: 220 U/L — ABNORMAL HIGH (ref 98–192)

## 2021-12-24 MED ORDER — BORTEZOMIB CHEMO SQ INJECTION 3.5 MG (2.5MG/ML)
1.3000 mg/m2 | Freq: Once | INTRAMUSCULAR | Status: AC
  Administered 2021-12-24: 2.5 mg via SUBCUTANEOUS
  Filled 2021-12-24: qty 1

## 2021-12-24 MED ORDER — DIPHENHYDRAMINE HCL 25 MG PO CAPS
50.0000 mg | ORAL_CAPSULE | Freq: Once | ORAL | Status: AC
  Administered 2021-12-24: 50 mg via ORAL
  Filled 2021-12-24: qty 2

## 2021-12-24 MED ORDER — DARATUMUMAB-HYALURONIDASE-FIHJ 1800-30000 MG-UT/15ML ~~LOC~~ SOLN
1800.0000 mg | Freq: Once | SUBCUTANEOUS | Status: AC
  Administered 2021-12-24: 1800 mg via SUBCUTANEOUS
  Filled 2021-12-24: qty 15

## 2021-12-24 MED ORDER — ACETAMINOPHEN 325 MG PO TABS
650.0000 mg | ORAL_TABLET | Freq: Once | ORAL | Status: AC
  Administered 2021-12-24: 650 mg via ORAL
  Filled 2021-12-24: qty 2

## 2021-12-24 MED ORDER — DEXAMETHASONE 4 MG PO TABS
20.0000 mg | ORAL_TABLET | Freq: Once | ORAL | Status: AC
  Administered 2021-12-24: 20 mg via ORAL
  Filled 2021-12-24: qty 5

## 2021-12-24 NOTE — Patient Instructions (Signed)
Bortezomib injection What is this medication? BORTEZOMIB (bor TEZ oh mib) targets proteins in cancer cells and stops thecancer cells from growing. It treats multiple myeloma and mantle cell lymphoma. This medicine may be used for other purposes; ask your health care provider orpharmacist if you have questions. COMMON BRAND NAME(S): Velcade What should I tell my care team before I take this medication? They need to know if you have any of these conditions: dehydration diabetes (high blood sugar) heart disease liver disease tingling of the fingers or toes or other nerve disorder an unusual or allergic reaction to bortezomib, mannitol, boron, other medicines, foods, dyes, or preservatives pregnant or trying to get pregnant breast-feeding How should I use this medication? This medicine is injected into a vein or under the skin. It is given by ahealth care provider in a hospital or clinic setting. Talk to your health care provider about the use of this medicine in children.Special care may be needed. Overdosage: If you think you have taken too much of this medicine contact apoison control center or emergency room at once. NOTE: This medicine is only for you. Do not share this medicine with others. What if I miss a dose? Keep appointments for follow-up doses. It is important not to miss your dose.Call your health care provider if you are unable to keep an appointment. What may interact with this medication? This medicine may interact with the following medications: ketoconazole rifampin This list may not describe all possible interactions. Give your health care provider a list of all the medicines, herbs, non-prescription drugs, or dietary supplements you use. Also tell them if you smoke, drink alcohol, or use illegaldrugs. Some items may interact with your medicine. What should I watch for while using this medication? Your condition will be monitored carefully while you are receiving  thismedicine. You may need blood work done while you are taking this medicine. You may get drowsy or dizzy. Do not drive, use machinery, or do anything that needs mental alertness until you know how this medicine affects you. Do not stand up or sit up quickly, especially if you are an older patient. Thisreduces the risk of dizzy or fainting spells This medicine may increase your risk of getting an infection. Call your health care provider for advice if you get a fever, chills, sore throat, or other symptoms of a cold or flu. Do not treat yourself. Try to avoid being aroundpeople who are sick. Check with your health care provider if you have severe diarrhea, nausea, and vomiting, or if you sweat a lot. The loss of too much body fluid may make itdangerous for you to take this medicine. Do not become pregnant while taking this medicine or for 7 months after stopping it. Women should inform their health care provider if they wish to become pregnant or think they might be pregnant. Men should not father a child while taking this medicine and for 4 months after stopping it. There is a potential for serious harm to an unborn child. Talk to your health care provider for more information. Do not breast-feed an infant while taking thismedicine or for 2 months after stopping it. This medicine may make it more difficult to get pregnant or father a child.Talk to your health care provider if you are concerned about your fertility. What side effects may I notice from receiving this medication? Side effects that you should report to your doctor or health care professionalas soon as possible: allergic reactions (skin rash; itching or hives; swelling   health care professional as soon as possible: allergic reactions (skin rash; itching or hives; swelling of the face, lips, or tongue) bleeding (bloody or black, tarry stools; red or dark brown urine; spitting up blood or brown material that looks like coffee grounds; red spots on the skin; unusual bruising or bleeding from the eye, gums, or nose) blurred vision  or changes in vision confusion constipation headache heart failure (trouble breathing; fast, irregular heartbeat; sudden weight gain; swelling of the ankles, feet, hands) infection (fever, chills, cough, sore throat, pain or trouble passing urine) lack or loss of appetite liver injury (dark yellow or brown urine; general ill feeling or flu-like symptoms; loss of appetite, right upper belly pain; yellowing of the eyes or skin) low blood pressure (dizziness; feeling faint or lightheaded, falls; unusually weak or tired) muscle cramps pain, redness, or irritation at site where injected pain, tingling, numbness in the hands or feet seizures trouble breathing unusual bruising or bleeding Side effects that usually do not require medical attention (report to your doctor or health care professional if they continue or are bothersome): diarrhea nausea stomach pain trouble sleeping vomiting This list may not describe all possible side effects. Call your doctor for medical advice about side effects. You may report side effects to FDA at 1-800-FDA-1088. Where should I keep my medication? This medicine is given in a hospital or clinic. It will not be stored at home. NOTE: This sheet is a summary. It may not cover all possible information. If you have questions about this medicine, talk to your doctor, pharmacist, or health care provider.  2023 Elsevier/Gold Standard (2020-05-29 00:00:00) Daratumumab; Hyaluronidase Injection What is this medication? DARATUMUMAB; HYALURONIDASE (dar a toom ue mab / hye al ur ON i dase) is a monoclonal antibody. Hyaluronidase is used to improve the effects of daratumumab. It treats certain types of cancer. Some of the cancers treated are multiple myeloma and light-chain amyloidosis. This medicine may be used for other purposes; ask your health care provider or pharmacist if you have questions. COMMON BRAND NAME(S): DARZALEX FASPRO What should I tell my care team before  I take this medication? They need to know if you have any of these conditions: heart disease infection especially a viral infection such as chickenpox, cold sores, herpes, or hepatitis B lung or breathing disease an unusual or allergic reaction to daratumumab, hyaluronidase, other medicines, foods, dyes, or preservatives pregnant or trying to get pregnant breast-feeding How should I use this medication? This medicine is for injection under the skin. It is given by a health care professional in a hospital or clinic setting. Talk to your pediatrician regarding the use of this medicine in children. Special care may be needed. Overdosage: If you think you have taken too much of this medicine contact a poison control center or emergency room at once. NOTE: This medicine is only for you. Do not share this medicine with others. What if I miss a dose? Keep appointments for follow-up doses as directed. It is important not to miss your dose. Call your doctor or health care professional if you are unable to keep an appointment. What may interact with this medication? Interactions have not been studied. This list may not describe all possible interactions. Give your health care provider a list of all the medicines, herbs, non-prescription drugs, or dietary supplements you use. Also tell them if you smoke, drink alcohol, or use illegal drugs. Some items may interact with your medicine. What should I watch for while using this  medication? Your condition will be monitored carefully while you are receiving this medicine. This medicine can cause serious allergic reactions. To reduce your risk, your health care provider may give you other medicine to take before receiving this one. Be sure to follow the directions from your health care provider. This medicine can affect the results of blood tests to match your blood type. These changes can last for up to 6 months after the final dose. Your healthcare provider  will do blood tests to match your blood type before you start treatment. Tell all of your healthcare providers that you are being treated with this medicine before receiving a blood transfusion. This medicine can affect the results of some tests used to determine treatment response; extra tests may be needed to evaluate response. Do not become pregnant while taking this medicine or for 3 months after stopping it. Women should inform their health care provider if they wish to become pregnant or think they might be pregnant. There is a potential for serious side effects to an unborn child. Talk to your health care provider for more information. Do not breast-feed an infant while taking this medicine. What side effects may I notice from receiving this medication? Side effects that you should report to your care team as soon as possible: Allergic reactions--skin rash, itching or hives, swelling of the face, lips, or tongue Blood clot--chest pain, shortness of breath, pain, swelling or warmth in the leg Blurred vision Fast, irregular heartbeat Infection--fever, chills, cough, sore throat, pain or trouble passing urine Injection reactions--dizziness, fast heartbeat, feeling faint or lightheaded, falls, headache, increase in blood pressure, nausea, vomiting, or wheezing or trouble breathing with loud or whistling sounds Low red blood cell counts--trouble breathing, feeling faint, lightheaded or falls, unusually weak or tired Unusual bleeding or bruising Side effects that usually do not require medical attention (report these to your care team if they continue or are bothersome): Back pain Constipation Diarrhea Pain, tingling, numbness in the hands or feet Pain, redness, or irritation at site where injected Muscle cramp or pain Swelling of the ankles, feet, hands Tiredness Trouble sleeping This list may not describe all possible side effects. Call your doctor for medical advice about side effects. You  may report side effects to FDA at 1-800-FDA-1088. Where should I keep my medication? This drug is given in a hospital or clinic and will not be stored at home. NOTE: This sheet is a summary. It may not cover all possible information. If you have questions about this medicine, talk to your doctor, pharmacist, or health care provider.  2023 Elsevier/Gold Standard (2021-05-08 00:00:00)

## 2021-12-24 NOTE — Progress Notes (Signed)
Hematology and Oncology Follow Up Visit  Marvin Salinas [id number] 03-Sep-1945 76 y.o. 12/24/2021   Principle Diagnosis:  Lambda light chain myeloma --normal cytogenetics  Current Therapy:   Faspro/Velcade/Revlimid -- s/p cycle #2 -- start on 11/05/2021     Interim History:  Marvin Salinas is back for follow-up.  He is responding to treatment very nicely.  His lambda light chain went from 910 down to 228 mg/L.  I am just very happy about that for him.  His renal function also is improving a little bit.  There is been no problems with nausea or vomiting.  He has had no rashes.  He has had no diarrhea.  He has had no problems with the Revlimid.  He is still on the aspirin.  He has had no leg swelling.  Overall, I would say his performance status is probably ECOG 1.    Medications:  Current Outpatient Medications:    BD INSULIN SYRINGE U/F 31G X 5/16" 0.5 ML MISC, E.11.9- monitor blood glucose tid for IDDM, Disp: 100 each, Rfl: 4   blood glucose meter kit and supplies KIT, Dispense based on patient and insurance preference. Use up to four times daily as directed., Disp: 1 each, Rfl: 0   Carboxymethylcell-Hypromellose (GENTEAL OP), Place 1 drop into both eyes daily as needed (dry eyes)., Disp: , Rfl:    dofetilide (TIKOSYN) 125 MCG capsule, TAKE 1 CAPSULE BY MOUTH TWICE A DAY, Disp: 180 capsule, Rfl: 3   ELIQUIS 5 MG TABS tablet, TAKE 1 TABLET BY MOUTH TWICE A DAY, Disp: 180 tablet, Rfl: 1   ezetimibe (ZETIA) 10 MG tablet, Take 1 tablet (10 mg total) by mouth daily., Disp: 90 tablet, Rfl: 3   famciclovir (FAMVIR) 250 MG tablet, Take 1 tablet (250 mg total) by mouth daily., Disp: 30 tablet, Rfl: 12   fluticasone (FLONASE) 50 MCG/ACT nasal spray, Place 2 sprays into both nostrils daily., Disp: , Rfl:    insulin glargine (LANTUS) 100 UNIT/ML injection, 30 units subcu injection in the morning and 35 units subcu injection before bed., Disp: 30 mL, Rfl: 5   Lancets (ONETOUCH DELICA PLUS [id number])  MISC, USE UP TO FOUR TIMES DAILY AS DIRECTED **EMERGENCY FILL**, Disp: 100 each, Rfl: 11   lenalidomide (REVLIMID) 10 MG capsule, Take 1 capsule (10 mg total) by mouth daily. [employer] Auth # [id number]     Date Obtained 12/11/21, Disp: 21 capsule, Rfl: 0   loratadine (CLARITIN) 10 MG tablet, Take 10 mg by mouth daily as needed for allergies., Disp: , Rfl:    methocarbamol (ROBAXIN) 500 MG tablet, Take 500 mg by mouth daily., Disp: , Rfl:    metoprolol tartrate (LOPRESSOR) 25 MG tablet, Take 0.5 tablets (12.5 mg total) by mouth 2 (two) times daily., Disp: 90 tablet, Rfl: 3   nitroGLYCERIN (NITROSTAT) 0.4 MG SL tablet, PLACE 1 TABLET (0.4 MG TOTAL) UNDER THE TONGUE EVERY 5 (FIVE) MINUTES AS NEEDED FOR CHEST PAIN., Disp: 25 tablet, Rfl: 12   nitroGLYCERIN (NITROSTAT) 0.4 MG SL tablet, Place 1 tablet under the tongue every 5 (five) minutes as needed., Disp: , Rfl:    olmesartan (BENICAR) 20 MG tablet, Take 1 tablet by mouth daily., Disp: , Rfl:    olmesartan (BENICAR) 40 MG tablet, Take 40 mg by mouth daily., Disp: , Rfl:    ondansetron (ZOFRAN) 8 MG tablet, Take 1 tablet (8 mg total) by mouth 2 (two) times daily as needed (Nausea or vomiting)., Disp: 30 tablet, Rfl: 1   ONETOUCH  VERIO test strip, USE UP TO FOUR TIMES DAILY AS DIRECTED., Disp: 100 strip, Rfl: 3   polyethylene glycol powder (GLYCOLAX/MIRALAX) 17 GM/SCOOP powder, Take by mouth., Disp: , Rfl:    prochlorperazine (COMPAZINE) 10 MG tablet, Take 1 tablet (10 mg total) by mouth every 6 (six) hours as needed (Nausea or vomiting)., Disp: 30 tablet, Rfl: 1   rosuvastatin (CRESTOR) 20 MG tablet, Take 1 tablet (20 mg total) by mouth at bedtime., Disp: 90 tablet, Rfl: 3   Semaglutide (RYBELSUS) 7 MG TABS, Take 1 tablet by mouth daily with breakfast., Disp: 90 tablet, Rfl: 1  Allergies:  Allergies  Allergen Reactions   Codeine Nausea And Vomiting   Lisinopril Cough   Nsaids Other (See Comments)    CKD   Dapagliflozin Rash   Latex Hives    Past  Medical History, Surgical history, Social history, and Family History were reviewed and updated.  Review of Systems: Review of Systems  Constitutional:  Positive for fatigue.  HENT:  Negative.    Eyes: Negative.   Respiratory: Negative.    Cardiovascular: Negative.   Gastrointestinal: Negative.   Endocrine: Negative.   Genitourinary: Negative.    Musculoskeletal: Negative.   Skin: Negative.   Neurological: Negative.   Hematological: Negative.   Psychiatric/Behavioral: Negative.      Physical Exam:  weight is 169 lb (76.7 kg). His oral temperature is 97.8 F (36.6 C). His blood pressure is 165/56 (abnormal) and his pulse is 53 (abnormal). His respiration is 19 and oxygen saturation is 99%.   Wt Readings from Last 3 Encounters:  12/24/21 169 lb (76.7 kg)  11/26/21 169 lb (76.7 kg)  10/28/21 168 lb (76.2 kg)    Physical Exam Vitals reviewed.  HENT:     Head: Normocephalic and atraumatic.  Eyes:     Pupils: Pupils are equal, round, and reactive to light.  Cardiovascular:     Rate and Rhythm: Normal rate and regular rhythm.     Heart sounds: Normal heart sounds.  Pulmonary:     Effort: Pulmonary effort is normal.     Breath sounds: Normal breath sounds.  Abdominal:     General: Bowel sounds are normal.     Palpations: Abdomen is soft.  Musculoskeletal:        General: No tenderness or deformity. Normal range of motion.     Cervical back: Normal range of motion.  Lymphadenopathy:     Cervical: No cervical adenopathy.  Skin:    General: Skin is warm and dry.     Findings: No erythema or rash.  Neurological:     Mental Status: He is alert and oriented to person, place, and time.  Psychiatric:        Behavior: Behavior normal.        Thought Content: Thought content normal.        Judgment: Judgment normal.      Lab Results  Component Value Date   WBC 5.8 12/24/2021   HGB 13.0 12/24/2021   HCT 38.9 (L) 12/24/2021   MCV 92.6 12/24/2021   PLT 147 (L)  12/24/2021     Chemistry      Component Value Date/Time   NA 138 12/16/2021 0803   NA 140 08/31/2021 1026   K 4.3 12/16/2021 0803   CL 103 12/16/2021 0803   CO2 28 12/16/2021 0803   BUN 29 (H) 12/16/2021 0803   BUN 21 08/31/2021 1026   CREATININE 1.47 (H) 12/16/2021 9417  Component Value Date/Time   CALCIUM 9.1 12/16/2021 0803   ALKPHOS 54 12/16/2021 0803   AST 37 12/16/2021 0803   ALT 61 (H) 12/16/2021 0803   BILITOT 0.5 12/16/2021 0803      Impression and Plan: Marvin Salinas is a very nice 76 year old white male.  He has lambda light chain myeloma.  He has mild renal insufficiency.  He does not have any bone lesions.  He is not hypercalcemic.  We will go ahead with his 3rd cycle of treatment.  It would be nice if his lambda light chain was down below 100 now.  His renal function is slowly improving.  Hopefully this is an indicator that the light chains are improving and his kidneys are opening up.  We will go ahead and plan to get him back in another 3 weeks.    Volanda Napoleon, MD 7/6/20239:53 AM

## 2021-12-25 LAB — IGG, IGA, IGM
IgA: 47 mg/dL — ABNORMAL LOW (ref 61–437)
IgG (Immunoglobin G), Serum: 620 mg/dL (ref 603–1613)
IgM (Immunoglobulin M), Srm: 55 mg/dL (ref 15–143)

## 2021-12-25 LAB — KAPPA/LAMBDA LIGHT CHAINS
Kappa free light chain: 55.2 mg/L — ABNORMAL HIGH (ref 3.3–19.4)
Kappa, lambda light chain ratio: 0.17 — ABNORMAL LOW (ref 0.26–1.65)
Lambda free light chains: 321.5 mg/L — ABNORMAL HIGH (ref 5.7–26.3)

## 2021-12-30 ENCOUNTER — Inpatient Hospital Stay: Payer: Medicare Other

## 2021-12-30 VITALS — BP 151/53 | HR 55 | Temp 97.6°F | Resp 18

## 2021-12-30 DIAGNOSIS — Z5112 Encounter for antineoplastic immunotherapy: Secondary | ICD-10-CM | POA: Diagnosis not present

## 2021-12-30 DIAGNOSIS — C9000 Multiple myeloma not having achieved remission: Secondary | ICD-10-CM

## 2021-12-30 DIAGNOSIS — Z79899 Other long term (current) drug therapy: Secondary | ICD-10-CM | POA: Diagnosis not present

## 2021-12-30 LAB — CBC WITH DIFFERENTIAL (CANCER CENTER ONLY)
Abs Immature Granulocytes: 0.02 10*3/uL (ref 0.00–0.07)
Basophils Absolute: 0.1 10*3/uL (ref 0.0–0.1)
Basophils Relative: 1 %
Eosinophils Absolute: 0.2 10*3/uL (ref 0.0–0.5)
Eosinophils Relative: 3 %
HCT: 37.5 % — ABNORMAL LOW (ref 39.0–52.0)
Hemoglobin: 12.5 g/dL — ABNORMAL LOW (ref 13.0–17.0)
Immature Granulocytes: 0 %
Lymphocytes Relative: 29 %
Lymphs Abs: 2 10*3/uL (ref 0.7–4.0)
MCH: 30.6 pg (ref 26.0–34.0)
MCHC: 33.3 g/dL (ref 30.0–36.0)
MCV: 91.9 fL (ref 80.0–100.0)
Monocytes Absolute: 0.7 10*3/uL (ref 0.1–1.0)
Monocytes Relative: 11 %
Neutro Abs: 3.7 10*3/uL (ref 1.7–7.7)
Neutrophils Relative %: 56 %
Platelet Count: 201 10*3/uL (ref 150–400)
RBC: 4.08 MIL/uL — ABNORMAL LOW (ref 4.22–5.81)
RDW: 13.9 % (ref 11.5–15.5)
WBC Count: 6.8 10*3/uL (ref 4.0–10.5)
nRBC: 0 % (ref 0.0–0.2)

## 2021-12-30 LAB — CMP (CANCER CENTER ONLY)
ALT: 26 U/L (ref 0–44)
AST: 19 U/L (ref 15–41)
Albumin: 4 g/dL (ref 3.5–5.0)
Alkaline Phosphatase: 56 U/L (ref 38–126)
Anion gap: 5 (ref 5–15)
BUN: 28 mg/dL — ABNORMAL HIGH (ref 8–23)
CO2: 29 mmol/L (ref 22–32)
Calcium: 9.2 mg/dL (ref 8.9–10.3)
Chloride: 104 mmol/L (ref 98–111)
Creatinine: 1.7 mg/dL — ABNORMAL HIGH (ref 0.61–1.24)
GFR, Estimated: 41 mL/min — ABNORMAL LOW (ref >60)
Glucose, Bld: 185 mg/dL — ABNORMAL HIGH (ref 70–99)
Potassium: 4.3 mmol/L (ref 3.5–5.1)
Sodium: 138 mmol/L (ref 135–145)
Total Bilirubin: 0.4 mg/dL (ref 0.3–1.2)
Total Protein: 6 g/dL — ABNORMAL LOW (ref 6.5–8.1)

## 2021-12-30 MED ORDER — DIPHENHYDRAMINE HCL 25 MG PO CAPS
50.0000 mg | ORAL_CAPSULE | Freq: Once | ORAL | Status: AC
  Administered 2021-12-30: 50 mg via ORAL
  Filled 2021-12-30: qty 2

## 2021-12-30 MED ORDER — DARATUMUMAB-HYALURONIDASE-FIHJ 1800-30000 MG-UT/15ML ~~LOC~~ SOLN
1800.0000 mg | Freq: Once | SUBCUTANEOUS | Status: AC
  Administered 2021-12-30: 1800 mg via SUBCUTANEOUS
  Filled 2021-12-30: qty 15

## 2021-12-30 MED ORDER — DEXAMETHASONE 4 MG PO TABS
20.0000 mg | ORAL_TABLET | Freq: Once | ORAL | Status: AC
  Administered 2021-12-30: 20 mg via ORAL
  Filled 2021-12-30: qty 5

## 2021-12-30 MED ORDER — ACETAMINOPHEN 325 MG PO TABS
650.0000 mg | ORAL_TABLET | Freq: Once | ORAL | Status: AC
  Administered 2021-12-30: 650 mg via ORAL
  Filled 2021-12-30: qty 2

## 2021-12-30 NOTE — Progress Notes (Signed)
Patient refused to wait one hour post injection. Released stable and ASX. 

## 2022-01-01 LAB — PROTEIN ELECTROPHORESIS, SERUM, WITH REFLEX
A/G Ratio: 1.3 (ref 0.7–1.7)
Albumin ELP: 3.4 g/dL (ref 2.9–4.4)
Alpha-1-Globulin: 0.3 g/dL (ref 0.0–0.4)
Alpha-2-Globulin: 0.9 g/dL (ref 0.4–1.0)
Beta Globulin: 1 g/dL (ref 0.7–1.3)
Gamma Globulin: 0.6 g/dL (ref 0.4–1.8)
Globulin, Total: 2.7 g/dL (ref 2.2–3.9)
M-Spike, %: 0.2 g/dL — ABNORMAL HIGH
SPEP Interpretation: 0
Total Protein ELP: 6.1 g/dL (ref 6.0–8.5)

## 2022-01-01 LAB — IMMUNOFIXATION REFLEX, SERUM
IgA: 52 mg/dL — ABNORMAL LOW (ref 61–437)
IgG (Immunoglobin G), Serum: 680 mg/dL (ref 603–1613)
IgM (Immunoglobulin M), Srm: 63 mg/dL (ref 15–143)

## 2022-01-04 ENCOUNTER — Other Ambulatory Visit: Payer: Self-pay | Admitting: Hematology & Oncology

## 2022-01-04 DIAGNOSIS — C9000 Multiple myeloma not having achieved remission: Secondary | ICD-10-CM

## 2022-01-06 ENCOUNTER — Inpatient Hospital Stay: Payer: Medicare Other

## 2022-01-06 VITALS — BP 162/57 | HR 57 | Temp 97.7°F | Resp 17 | Ht 68.0 in | Wt 160.0 lb

## 2022-01-06 DIAGNOSIS — C9000 Multiple myeloma not having achieved remission: Secondary | ICD-10-CM

## 2022-01-06 DIAGNOSIS — Z5112 Encounter for antineoplastic immunotherapy: Secondary | ICD-10-CM | POA: Diagnosis not present

## 2022-01-06 DIAGNOSIS — Z79899 Other long term (current) drug therapy: Secondary | ICD-10-CM | POA: Diagnosis not present

## 2022-01-06 LAB — CBC WITH DIFFERENTIAL (CANCER CENTER ONLY)
Abs Immature Granulocytes: 0.02 10*3/uL (ref 0.00–0.07)
Basophils Absolute: 0.1 10*3/uL (ref 0.0–0.1)
Basophils Relative: 1 %
Eosinophils Absolute: 0.5 10*3/uL (ref 0.0–0.5)
Eosinophils Relative: 7 %
HCT: 37.2 % — ABNORMAL LOW (ref 39.0–52.0)
Hemoglobin: 12.5 g/dL — ABNORMAL LOW (ref 13.0–17.0)
Immature Granulocytes: 0 %
Lymphocytes Relative: 23 %
Lymphs Abs: 1.7 10*3/uL (ref 0.7–4.0)
MCH: 31 pg (ref 26.0–34.0)
MCHC: 33.6 g/dL (ref 30.0–36.0)
MCV: 92.3 fL (ref 80.0–100.0)
Monocytes Absolute: 0.4 10*3/uL (ref 0.1–1.0)
Monocytes Relative: 6 %
Neutro Abs: 4.4 10*3/uL (ref 1.7–7.7)
Neutrophils Relative %: 63 %
Platelet Count: 179 10*3/uL (ref 150–400)
RBC: 4.03 MIL/uL — ABNORMAL LOW (ref 4.22–5.81)
RDW: 13.7 % (ref 11.5–15.5)
WBC Count: 7.2 10*3/uL (ref 4.0–10.5)
nRBC: 0 % (ref 0.0–0.2)

## 2022-01-06 LAB — CMP (CANCER CENTER ONLY)
ALT: 37 U/L (ref 0–44)
AST: 31 U/L (ref 15–41)
Albumin: 4 g/dL (ref 3.5–5.0)
Alkaline Phosphatase: 56 U/L (ref 38–126)
Anion gap: 5 (ref 5–15)
BUN: 18 mg/dL (ref 8–23)
CO2: 28 mmol/L (ref 22–32)
Calcium: 8.9 mg/dL (ref 8.9–10.3)
Chloride: 105 mmol/L (ref 98–111)
Creatinine: 1.41 mg/dL — ABNORMAL HIGH (ref 0.61–1.24)
GFR, Estimated: 52 mL/min — ABNORMAL LOW (ref >60)
Glucose, Bld: 150 mg/dL — ABNORMAL HIGH (ref 70–99)
Potassium: 4.6 mmol/L (ref 3.5–5.1)
Sodium: 138 mmol/L (ref 135–145)
Total Bilirubin: 0.5 mg/dL (ref 0.3–1.2)
Total Protein: 5.9 g/dL — ABNORMAL LOW (ref 6.5–8.1)

## 2022-01-06 MED ORDER — DARATUMUMAB-HYALURONIDASE-FIHJ 1800-30000 MG-UT/15ML ~~LOC~~ SOLN
1800.0000 mg | Freq: Once | SUBCUTANEOUS | Status: AC
  Administered 2022-01-06: 1800 mg via SUBCUTANEOUS
  Filled 2022-01-06: qty 15

## 2022-01-06 MED ORDER — DEXAMETHASONE 4 MG PO TABS
20.0000 mg | ORAL_TABLET | Freq: Once | ORAL | Status: AC
  Administered 2022-01-06: 20 mg via ORAL
  Filled 2022-01-06: qty 5

## 2022-01-06 MED ORDER — DIPHENHYDRAMINE HCL 25 MG PO CAPS
50.0000 mg | ORAL_CAPSULE | Freq: Once | ORAL | Status: AC
  Administered 2022-01-06: 50 mg via ORAL
  Filled 2022-01-06: qty 2

## 2022-01-06 MED ORDER — BORTEZOMIB CHEMO SQ INJECTION 3.5 MG (2.5MG/ML)
1.3000 mg/m2 | Freq: Once | INTRAMUSCULAR | Status: AC
  Administered 2022-01-06: 2.5 mg via SUBCUTANEOUS
  Filled 2022-01-06: qty 1

## 2022-01-06 MED ORDER — ACETAMINOPHEN 325 MG PO TABS
650.0000 mg | ORAL_TABLET | Freq: Once | ORAL | Status: AC
  Administered 2022-01-06: 650 mg via ORAL
  Filled 2022-01-06: qty 2

## 2022-01-06 NOTE — Progress Notes (Signed)
Pt declined to stay for post infusion observation period. Pt stated he has tolerated medication multiple times prior without difficulty. Pt aware to call clinic with any questions or concerns. Pt verbalized understanding and had no further questions.   

## 2022-01-06 NOTE — Patient Instructions (Signed)
[facility] [place]  Discharge Instructions: Thank you for choosing [facility] to provide your oncology and hematology care.   If you have a lab appointment with the [facility], please go directly to the [facility] and check in at the registration area.  Wear comfortable clothing and clothing appropriate for easy access to any Portacath or PICC line.   We strive to give you quality time with your provider. You may need to reschedule your appointment if you arrive late (15 or more minutes).  Arriving late affects you and other patients whose appointments are after yours.  Also, if you miss three or more appointments without notifying the office, you may be dismissed from the clinic at the provider's discretion.      For prescription refill requests, have your pharmacy contact our office and allow 72 hours for refills to be completed.    Today you received the following chemotherapy and/or immunotherapy agents darzalex, velcade   To help prevent nausea and vomiting after your treatment, we encourage you to take your nausea medication as directed.  BELOW ARE SYMPTOMS THAT SHOULD BE REPORTED IMMEDIATELY: *FEVER GREATER THAN 100.4 F (38 C) OR HIGHER *CHILLS OR SWEATING *NAUSEA AND VOMITING THAT IS NOT CONTROLLED WITH YOUR NAUSEA MEDICATION *UNUSUAL SHORTNESS OF BREATH *UNUSUAL BRUISING OR BLEEDING *URINARY PROBLEMS (pain or burning when urinating, or frequent urination) *BOWEL PROBLEMS (unusual diarrhea, constipation, pain near the anus) TENDERNESS IN MOUTH AND THROAT WITH OR WITHOUT PRESENCE OF ULCERS (sore throat, sores in mouth, or a toothache) UNUSUAL RASH, SWELLING OR PAIN  UNUSUAL VAGINAL DISCHARGE OR ITCHING   Items with * indicate a potential emergency and should be followed up as soon as possible or go to the Emergency Department if any problems should occur.  Please show the CHEMOTHERAPY ALERT CARD or IMMUNOTHERAPY ALERT CARD at check-in to  the Emergency Department and triage nurse. Should you have questions after your visit or need to cancel or reschedule your appointment, please contact [facility]  [phone] and follow the prompts.  Office hours are 8:00 a.m. to 4:30 p.m. Monday - Friday. Please note that voicemails left after 4:00 p.m. may not be returned until the following business day.  We are closed weekends and major holidays. You have access to a nurse at all times for urgent questions. Please call the main number to the clinic [phone] and follow the prompts.  For any non-urgent questions, you may also contact your provider using MyChart. We now offer e-Visits for anyone 53 and older to request care online for non-urgent symptoms. For details visit [email or web address].   Also download the MyChart app! Go to the app store, search "MyChart", open the app, select [facility], and log in with your MyChart username and password.  Due to Covid, a mask is required upon entering the hospital/clinic. If you do not have a mask, one will be given to you upon arrival. For doctor visits, patients may have 1 support person aged 64 or older with them. For treatment visits, patients cannot have anyone with them due to current Covid guidelines and our immunocompromised population.

## 2022-01-07 ENCOUNTER — Other Ambulatory Visit: Payer: Self-pay | Admitting: *Deleted

## 2022-01-07 NOTE — Patient Outreach (Signed)
[facility]) Care Management  01/07/2022  Marvin Salinas 1945-08-11 [id number]  Nurse spoke with patient to inform them that their PCP has a RN CM what will follow up with them in the future. Patient verbalized understanding.   Plan: RN Health Coach will close case.    Emelia Loron RN, BSN [facility] [phone] [email or web address]

## 2022-01-11 ENCOUNTER — Other Ambulatory Visit: Payer: Self-pay

## 2022-01-13 ENCOUNTER — Inpatient Hospital Stay: Payer: Medicare Other

## 2022-01-13 VITALS — BP 141/53 | HR 56 | Temp 97.5°F | Resp 17

## 2022-01-13 DIAGNOSIS — Z79899 Other long term (current) drug therapy: Secondary | ICD-10-CM | POA: Diagnosis not present

## 2022-01-13 DIAGNOSIS — C9000 Multiple myeloma not having achieved remission: Secondary | ICD-10-CM

## 2022-01-13 DIAGNOSIS — Z5112 Encounter for antineoplastic immunotherapy: Secondary | ICD-10-CM | POA: Diagnosis not present

## 2022-01-13 LAB — CMP (CANCER CENTER ONLY)
ALT: 23 U/L (ref 0–44)
AST: 19 U/L (ref 15–41)
Albumin: 4 g/dL (ref 3.5–5.0)
Alkaline Phosphatase: 60 U/L (ref 38–126)
Anion gap: 8 (ref 5–15)
BUN: 27 mg/dL — ABNORMAL HIGH (ref 8–23)
CO2: 27 mmol/L (ref 22–32)
Calcium: 9.1 mg/dL (ref 8.9–10.3)
Chloride: 105 mmol/L (ref 98–111)
Creatinine: 1.5 mg/dL — ABNORMAL HIGH (ref 0.61–1.24)
GFR, Estimated: 48 mL/min — ABNORMAL LOW (ref >60)
Glucose, Bld: 146 mg/dL — ABNORMAL HIGH (ref 70–99)
Potassium: 4.5 mmol/L (ref 3.5–5.1)
Sodium: 140 mmol/L (ref 135–145)
Total Bilirubin: 0.5 mg/dL (ref 0.3–1.2)
Total Protein: 5.8 g/dL — ABNORMAL LOW (ref 6.5–8.1)

## 2022-01-13 LAB — CBC WITH DIFFERENTIAL (CANCER CENTER ONLY)
Abs Immature Granulocytes: 0.03 10*3/uL (ref 0.00–0.07)
Basophils Absolute: 0.1 10*3/uL (ref 0.0–0.1)
Basophils Relative: 1 %
Eosinophils Absolute: 0.5 10*3/uL (ref 0.0–0.5)
Eosinophils Relative: 6 %
HCT: 38.1 % — ABNORMAL LOW (ref 39.0–52.0)
Hemoglobin: 12.7 g/dL — ABNORMAL LOW (ref 13.0–17.0)
Immature Granulocytes: 0 %
Lymphocytes Relative: 15 %
Lymphs Abs: 1.2 10*3/uL (ref 0.7–4.0)
MCH: 30.5 pg (ref 26.0–34.0)
MCHC: 33.3 g/dL (ref 30.0–36.0)
MCV: 91.6 fL (ref 80.0–100.0)
Monocytes Absolute: 0.8 10*3/uL (ref 0.1–1.0)
Monocytes Relative: 9 %
Neutro Abs: 5.6 10*3/uL (ref 1.7–7.7)
Neutrophils Relative %: 69 %
Platelet Count: 128 10*3/uL — ABNORMAL LOW (ref 150–400)
RBC: 4.16 MIL/uL — ABNORMAL LOW (ref 4.22–5.81)
RDW: 13.9 % (ref 11.5–15.5)
WBC Count: 8.2 10*3/uL (ref 4.0–10.5)
nRBC: 0 % (ref 0.0–0.2)

## 2022-01-13 MED ORDER — ACETAMINOPHEN 325 MG PO TABS
650.0000 mg | ORAL_TABLET | Freq: Once | ORAL | Status: AC
  Administered 2022-01-13: 650 mg via ORAL
  Filled 2022-01-13: qty 2

## 2022-01-13 MED ORDER — BORTEZOMIB CHEMO SQ INJECTION 3.5 MG (2.5MG/ML)
1.3000 mg/m2 | Freq: Once | INTRAMUSCULAR | Status: AC
  Administered 2022-01-13: 2.5 mg via SUBCUTANEOUS
  Filled 2022-01-13: qty 1

## 2022-01-13 MED ORDER — PROCHLORPERAZINE MALEATE 10 MG PO TABS
5.0000 mg | ORAL_TABLET | Freq: Once | ORAL | Status: AC
  Administered 2022-01-13: 5 mg via ORAL
  Filled 2022-01-13: qty 1

## 2022-01-13 MED ORDER — DIPHENHYDRAMINE HCL 25 MG PO CAPS
50.0000 mg | ORAL_CAPSULE | Freq: Once | ORAL | Status: AC
  Administered 2022-01-13: 50 mg via ORAL
  Filled 2022-01-13: qty 2

## 2022-01-13 MED ORDER — DEXAMETHASONE 4 MG PO TABS
20.0000 mg | ORAL_TABLET | Freq: Once | ORAL | Status: AC
  Administered 2022-01-13: 20 mg via ORAL
  Filled 2022-01-13: qty 5

## 2022-01-13 MED ORDER — DARATUMUMAB-HYALURONIDASE-FIHJ 1800-30000 MG-UT/15ML ~~LOC~~ SOLN
1800.0000 mg | Freq: Once | SUBCUTANEOUS | Status: AC
  Administered 2022-01-13: 1800 mg via SUBCUTANEOUS
  Filled 2022-01-13: qty 15

## 2022-01-13 NOTE — Progress Notes (Signed)
Reviewed pt labs with Dr. Marin Olp and pt ok to treat with creatinine 1.5   Pt declined to stay for post infusion observation period. Pt stated he has tolerated medication multiple times prior without difficulty. Pt aware to call clinic with any questions or concerns. Pt verbalized understanding and had no further questions.

## 2022-01-13 NOTE — Patient Instructions (Signed)
[facility] [place]  Discharge Instructions: Thank you for choosing [facility] to provide your oncology and hematology care.   If you have a lab appointment with the [facility], please go directly to the [facility] and check in at the registration area.  Wear comfortable clothing and clothing appropriate for easy access to any Portacath or PICC line.   We strive to give you quality time with your provider. You may need to reschedule your appointment if you arrive late (15 or more minutes).  Arriving late affects you and other patients whose appointments are after yours.  Also, if you miss three or more appointments without notifying the office, you may be dismissed from the clinic at the provider's discretion.      For prescription refill requests, have your pharmacy contact our office and allow 72 hours for refills to be completed.    Today you received the following chemotherapy and/or immunotherapy agents darzalex, velcade   To help prevent nausea and vomiting after your treatment, we encourage you to take your nausea medication as directed.  BELOW ARE SYMPTOMS THAT SHOULD BE REPORTED IMMEDIATELY: *FEVER GREATER THAN 100.4 F (38 C) OR HIGHER *CHILLS OR SWEATING *NAUSEA AND VOMITING THAT IS NOT CONTROLLED WITH YOUR NAUSEA MEDICATION *UNUSUAL SHORTNESS OF BREATH *UNUSUAL BRUISING OR BLEEDING *URINARY PROBLEMS (pain or burning when urinating, or frequent urination) *BOWEL PROBLEMS (unusual diarrhea, constipation, pain near the anus) TENDERNESS IN MOUTH AND THROAT WITH OR WITHOUT PRESENCE OF ULCERS (sore throat, sores in mouth, or a toothache) UNUSUAL RASH, SWELLING OR PAIN  UNUSUAL VAGINAL DISCHARGE OR ITCHING   Items with * indicate a potential emergency and should be followed up as soon as possible or go to the Emergency Department if any problems should occur.  Please show the CHEMOTHERAPY ALERT CARD or IMMUNOTHERAPY ALERT CARD at check-in to  the Emergency Department and triage nurse. Should you have questions after your visit or need to cancel or reschedule your appointment, please contact [facility]  [phone] and follow the prompts.  Office hours are 8:00 a.m. to 4:30 p.m. Monday - Friday. Please note that voicemails left after 4:00 p.m. may not be returned until the following business day.  We are closed weekends and major holidays. You have access to a nurse at all times for urgent questions. Please call the main number to the clinic [phone] and follow the prompts.  For any non-urgent questions, you may also contact your provider using MyChart. We now offer e-Visits for anyone 68 and older to request care online for non-urgent symptoms. For details visit [email or web address].   Also download the MyChart app! Go to the app store, search "MyChart", open the app, select [facility], and log in with your MyChart username and password.  Due to Covid, a mask is required upon entering the hospital/clinic. If you do not have a mask, one will be given to you upon arrival. For doctor visits, patients may have 1 support person aged 70 or older with them. For treatment visits, patients cannot have anyone with them due to current Covid guidelines and our immunocompromised population.

## 2022-01-13 NOTE — Progress Notes (Signed)
Patient states he has nausea the afternoon after Velcade. He will be given prochlorperazine at a reduced dose of 5 mg due to a drug interaction with dofetilide. Orders entered per Dr. Antonieta Pert instructions.

## 2022-01-14 ENCOUNTER — Other Ambulatory Visit: Payer: Self-pay | Admitting: *Deleted

## 2022-01-14 DIAGNOSIS — C9000 Multiple myeloma not having achieved remission: Secondary | ICD-10-CM

## 2022-01-14 MED ORDER — LENALIDOMIDE 10 MG PO CAPS: 10.0000 mg | ORAL_CAPSULE | Freq: Every day | ORAL | 0 refills | Status: DC

## 2022-01-20 ENCOUNTER — Inpatient Hospital Stay: Payer: Medicare Other | Attending: Family

## 2022-01-20 ENCOUNTER — Inpatient Hospital Stay: Payer: Medicare Other

## 2022-01-20 VITALS — BP 130/53 | HR 65 | Temp 97.5°F | Resp 18

## 2022-01-20 DIAGNOSIS — Z79899 Other long term (current) drug therapy: Secondary | ICD-10-CM | POA: Insufficient documentation

## 2022-01-20 DIAGNOSIS — Z5112 Encounter for antineoplastic immunotherapy: Secondary | ICD-10-CM | POA: Insufficient documentation

## 2022-01-20 DIAGNOSIS — C9000 Multiple myeloma not having achieved remission: Secondary | ICD-10-CM | POA: Diagnosis not present

## 2022-01-20 LAB — CBC WITH DIFFERENTIAL (CANCER CENTER ONLY)
Abs Immature Granulocytes: 0.01 10*3/uL (ref 0.00–0.07)
Basophils Absolute: 0 10*3/uL (ref 0.0–0.1)
Basophils Relative: 1 %
Eosinophils Absolute: 0.7 10*3/uL — ABNORMAL HIGH (ref 0.0–0.5)
Eosinophils Relative: 14 %
HCT: 33.9 % — ABNORMAL LOW (ref 39.0–52.0)
Hemoglobin: 11.4 g/dL — ABNORMAL LOW (ref 13.0–17.0)
Immature Granulocytes: 0 %
Lymphocytes Relative: 22 %
Lymphs Abs: 1 10*3/uL (ref 0.7–4.0)
MCH: 30.8 pg (ref 26.0–34.0)
MCHC: 33.6 g/dL (ref 30.0–36.0)
MCV: 91.6 fL (ref 80.0–100.0)
Monocytes Absolute: 0.6 10*3/uL (ref 0.1–1.0)
Monocytes Relative: 12 %
Neutro Abs: 2.4 10*3/uL (ref 1.7–7.7)
Neutrophils Relative %: 51 %
Platelet Count: 89 10*3/uL — ABNORMAL LOW (ref 150–400)
RBC: 3.7 MIL/uL — ABNORMAL LOW (ref 4.22–5.81)
RDW: 14.4 % (ref 11.5–15.5)
WBC Count: 4.7 10*3/uL (ref 4.0–10.5)
nRBC: 0 % (ref 0.0–0.2)

## 2022-01-20 LAB — CMP (CANCER CENTER ONLY)
ALT: 51 U/L — ABNORMAL HIGH (ref 0–44)
AST: 19 U/L (ref 15–41)
Albumin: 3.9 g/dL (ref 3.5–5.0)
Alkaline Phosphatase: 56 U/L (ref 38–126)
Anion gap: 7 (ref 5–15)
BUN: 25 mg/dL — ABNORMAL HIGH (ref 8–23)
CO2: 28 mmol/L (ref 22–32)
Calcium: 9.1 mg/dL (ref 8.9–10.3)
Chloride: 103 mmol/L (ref 98–111)
Creatinine: 1.38 mg/dL — ABNORMAL HIGH (ref 0.61–1.24)
GFR, Estimated: 53 mL/min — ABNORMAL LOW (ref >60)
Glucose, Bld: 171 mg/dL — ABNORMAL HIGH (ref 70–99)
Potassium: 4.5 mmol/L (ref 3.5–5.1)
Sodium: 138 mmol/L (ref 135–145)
Total Bilirubin: 0.6 mg/dL (ref 0.3–1.2)
Total Protein: 5.7 g/dL — ABNORMAL LOW (ref 6.5–8.1)

## 2022-01-20 MED ORDER — PROCHLORPERAZINE MALEATE 10 MG PO TABS
5.0000 mg | ORAL_TABLET | Freq: Once | ORAL | Status: AC
  Administered 2022-01-20: 5 mg via ORAL
  Filled 2022-01-20: qty 1

## 2022-01-20 MED ORDER — DIPHENHYDRAMINE HCL 25 MG PO CAPS
50.0000 mg | ORAL_CAPSULE | Freq: Once | ORAL | Status: AC
  Administered 2022-01-20: 50 mg via ORAL
  Filled 2022-01-20: qty 2

## 2022-01-20 MED ORDER — ACETAMINOPHEN 325 MG PO TABS
650.0000 mg | ORAL_TABLET | Freq: Once | ORAL | Status: AC
  Administered 2022-01-20: 650 mg via ORAL
  Filled 2022-01-20: qty 2

## 2022-01-20 MED ORDER — DEXAMETHASONE 4 MG PO TABS
20.0000 mg | ORAL_TABLET | Freq: Once | ORAL | Status: AC
  Administered 2022-01-20: 20 mg via ORAL
  Filled 2022-01-20: qty 5

## 2022-01-20 MED ORDER — DARATUMUMAB-HYALURONIDASE-FIHJ 1800-30000 MG-UT/15ML ~~LOC~~ SOLN
1800.0000 mg | Freq: Once | SUBCUTANEOUS | Status: AC
  Administered 2022-01-20: 1800 mg via SUBCUTANEOUS
  Filled 2022-01-20: qty 15

## 2022-01-20 NOTE — Progress Notes (Signed)
Per MD Ok to treat with PLTC 89

## 2022-01-20 NOTE — Patient Instructions (Signed)
[facility] [place]  Discharge Instructions: Thank you for choosing [facility] to provide your oncology and hematology care.   If you have a lab appointment with the Cancer Center, please go directly to the Cancer Center and check in at the registration area.  Wear comfortable clothing and clothing appropriate for easy access to any Portacath or PICC line.   We strive to give you quality time with your provider. You may need to reschedule your appointment if you arrive late (15 or more minutes).  Arriving late affects you and other patients whose appointments are after yours.  Also, if you miss three or more appointments without notifying the office, you may be dismissed from the clinic at the provider's discretion.      For prescription refill requests, have your pharmacy contact our office and allow 72 hours for refills to be completed.    Today you received the following chemotherapy and/or immunotherapy agents Darzalex      To help prevent nausea and vomiting after your treatment, we encourage you to take your nausea medication as directed.  BELOW ARE SYMPTOMS THAT SHOULD BE REPORTED IMMEDIATELY: *FEVER GREATER THAN 100.4 F (38 C) OR HIGHER *CHILLS OR SWEATING *NAUSEA AND VOMITING THAT IS NOT CONTROLLED WITH YOUR NAUSEA MEDICATION *UNUSUAL SHORTNESS OF BREATH *UNUSUAL BRUISING OR BLEEDING *URINARY PROBLEMS (pain or burning when urinating, or frequent urination) *BOWEL PROBLEMS (unusual diarrhea, constipation, pain near the anus) TENDERNESS IN MOUTH AND THROAT WITH OR WITHOUT PRESENCE OF ULCERS (sore throat, sores in mouth, or a toothache) UNUSUAL RASH, SWELLING OR PAIN  UNUSUAL VAGINAL DISCHARGE OR ITCHING   Items with * indicate a potential emergency and should be followed up as soon as possible or go to the Emergency Department if any problems should occur.  Please show the CHEMOTHERAPY ALERT CARD or IMMUNOTHERAPY ALERT CARD at check-in to the  Emergency Department and triage nurse. Should you have questions after your visit or need to cancel or reschedule your appointment, please contact [facility] [place] [place]  [phone] and follow the prompts.  Office hours are 8:00 a.m. to 4:30 p.m. Monday - Friday. Please note that voicemails left after 4:00 p.m. may not be returned until the following business day.  We are closed weekends and major holidays. You have access to a nurse at all times for urgent questions. Please call the main number to the clinic [phone] and follow the prompts.  For any non-urgent questions, you may also contact your provider using MyChart. We now offer e-Visits for anyone 18 and older to request care online for non-urgent symptoms. For details visit [email or web address].   Also download the MyChart app! Go to the app store, search "MyChart", open the app, select [facility], and log in with your MyChart username and password.  Masks are optional in the cancer centers. If you would like for your care team to wear a mask while they are taking care of you, please let them know. You may have one support person who is at least 77 years old accompany you for your appointments. 

## 2022-01-21 DIAGNOSIS — I129 Hypertensive chronic kidney disease with stage 1 through stage 4 chronic kidney disease, or unspecified chronic kidney disease: Secondary | ICD-10-CM | POA: Diagnosis not present

## 2022-01-21 DIAGNOSIS — R809 Proteinuria, unspecified: Secondary | ICD-10-CM | POA: Diagnosis not present

## 2022-01-21 DIAGNOSIS — E1129 Type 2 diabetes mellitus with other diabetic kidney complication: Secondary | ICD-10-CM | POA: Diagnosis not present

## 2022-01-21 DIAGNOSIS — E1122 Type 2 diabetes mellitus with diabetic chronic kidney disease: Secondary | ICD-10-CM | POA: Diagnosis not present

## 2022-01-21 DIAGNOSIS — N189 Chronic kidney disease, unspecified: Secondary | ICD-10-CM | POA: Diagnosis not present

## 2022-01-27 ENCOUNTER — Inpatient Hospital Stay: Payer: Medicare Other | Admitting: Hematology & Oncology

## 2022-01-27 ENCOUNTER — Inpatient Hospital Stay: Payer: Medicare Other

## 2022-01-27 ENCOUNTER — Encounter: Payer: Self-pay | Admitting: Hematology & Oncology

## 2022-01-27 ENCOUNTER — Other Ambulatory Visit: Payer: Self-pay

## 2022-01-27 VITALS — BP 140/59 | HR 63 | Temp 97.8°F | Resp 18 | Wt 164.0 lb

## 2022-01-27 VITALS — BP 140/60

## 2022-01-27 DIAGNOSIS — C9000 Multiple myeloma not having achieved remission: Secondary | ICD-10-CM

## 2022-01-27 DIAGNOSIS — Z5112 Encounter for antineoplastic immunotherapy: Secondary | ICD-10-CM | POA: Diagnosis not present

## 2022-01-27 DIAGNOSIS — Z79899 Other long term (current) drug therapy: Secondary | ICD-10-CM | POA: Diagnosis not present

## 2022-01-27 LAB — CBC WITH DIFFERENTIAL (CANCER CENTER ONLY)
Abs Immature Granulocytes: 0.01 10*3/uL (ref 0.00–0.07)
Basophils Absolute: 0.1 10*3/uL (ref 0.0–0.1)
Basophils Relative: 3 %
Eosinophils Absolute: 0.2 10*3/uL (ref 0.0–0.5)
Eosinophils Relative: 5 %
HCT: 36.3 % — ABNORMAL LOW (ref 39.0–52.0)
Hemoglobin: 11.9 g/dL — ABNORMAL LOW (ref 13.0–17.0)
Immature Granulocytes: 0 %
Lymphocytes Relative: 30 %
Lymphs Abs: 1.3 10*3/uL (ref 0.7–4.0)
MCH: 29.8 pg (ref 26.0–34.0)
MCHC: 32.8 g/dL (ref 30.0–36.0)
MCV: 91 fL (ref 80.0–100.0)
Monocytes Absolute: 0.7 10*3/uL (ref 0.1–1.0)
Monocytes Relative: 16 %
Neutro Abs: 2 10*3/uL (ref 1.7–7.7)
Neutrophils Relative %: 46 %
Platelet Count: 223 10*3/uL (ref 150–400)
RBC: 3.99 MIL/uL — ABNORMAL LOW (ref 4.22–5.81)
RDW: 14.6 % (ref 11.5–15.5)
WBC Count: 4.4 10*3/uL (ref 4.0–10.5)
nRBC: 0 % (ref 0.0–0.2)

## 2022-01-27 LAB — CMP (CANCER CENTER ONLY)
ALT: 26 U/L (ref 0–44)
AST: 18 U/L (ref 15–41)
Albumin: 4.1 g/dL (ref 3.5–5.0)
Alkaline Phosphatase: 45 U/L (ref 38–126)
Anion gap: 8 (ref 5–15)
BUN: 23 mg/dL (ref 8–23)
CO2: 25 mmol/L (ref 22–32)
Calcium: 9.2 mg/dL (ref 8.9–10.3)
Chloride: 106 mmol/L (ref 98–111)
Creatinine: 1.32 mg/dL — ABNORMAL HIGH (ref 0.61–1.24)
GFR, Estimated: 56 mL/min — ABNORMAL LOW (ref >60)
Glucose, Bld: 126 mg/dL — ABNORMAL HIGH (ref 70–99)
Potassium: 4.2 mmol/L (ref 3.5–5.1)
Sodium: 139 mmol/L (ref 135–145)
Total Bilirubin: 0.5 mg/dL (ref 0.3–1.2)
Total Protein: 5.7 g/dL — ABNORMAL LOW (ref 6.5–8.1)

## 2022-01-27 LAB — LACTATE DEHYDROGENASE: LDH: 173 U/L (ref 98–192)

## 2022-01-27 MED ORDER — BORTEZOMIB CHEMO SQ INJECTION 3.5 MG (2.5MG/ML)
1.3000 mg/m2 | Freq: Once | INTRAMUSCULAR | Status: AC
  Administered 2022-01-27: 2.5 mg via SUBCUTANEOUS
  Filled 2022-01-27: qty 1

## 2022-01-27 MED ORDER — DARATUMUMAB-HYALURONIDASE-FIHJ 1800-30000 MG-UT/15ML ~~LOC~~ SOLN
1800.0000 mg | Freq: Once | SUBCUTANEOUS | Status: AC
  Administered 2022-01-27: 1800 mg via SUBCUTANEOUS
  Filled 2022-01-27: qty 15

## 2022-01-27 MED ORDER — LORAZEPAM 1 MG PO TABS
0.5000 mg | ORAL_TABLET | Freq: Once | ORAL | Status: AC
  Administered 2022-01-27: 0.5 mg via ORAL

## 2022-01-27 MED ORDER — DEXAMETHASONE 4 MG PO TABS
20.0000 mg | ORAL_TABLET | Freq: Once | ORAL | Status: AC
  Administered 2022-01-27: 20 mg via ORAL

## 2022-01-27 MED ORDER — ACETAMINOPHEN 325 MG PO TABS
650.0000 mg | ORAL_TABLET | Freq: Once | ORAL | Status: AC
  Administered 2022-01-27: 650 mg via ORAL

## 2022-01-27 MED ORDER — DIPHENHYDRAMINE HCL 25 MG PO CAPS
50.0000 mg | ORAL_CAPSULE | Freq: Once | ORAL | Status: AC
  Administered 2022-01-27: 50 mg via ORAL

## 2022-01-27 NOTE — Progress Notes (Signed)
Per Dr. Antonieta Pert instructions: Reduced dose prochlorperazine removed from premedications for Velcade due to drug interaction with Tikosyn. Lorazepam 0.5 mg po added to day 1,8 premedications, lorazepam 0.5 mg and dexamethasone 4 mg added to day 8 premedications.

## 2022-01-27 NOTE — Patient Instructions (Signed)
[facility] [place]  Discharge Instructions: Thank you for choosing [facility] to provide your oncology and hematology care.   If you have a lab appointment with the [facility], please go directly to the [facility] and check in at the registration area.  Wear comfortable clothing and clothing appropriate for easy access to any Portacath or PICC line.   We strive to give you quality time with your provider. You may need to reschedule your appointment if you arrive late (15 or more minutes).  Arriving late affects you and other patients whose appointments are after yours.  Also, if you miss three or more appointments without notifying the office, you may be dismissed from the clinic at the provider's discretion.      For prescription refill requests, have your pharmacy contact our office and allow 72 hours for refills to be completed.    Today you received the following chemotherapy and/or immunotherapy agents velcade, faspro     To help prevent nausea and vomiting after your treatment, we encourage you to take your nausea medication as directed.  BELOW ARE SYMPTOMS THAT SHOULD BE REPORTED IMMEDIATELY: *FEVER GREATER THAN 100.4 F (38 C) OR HIGHER *CHILLS OR SWEATING *NAUSEA AND VOMITING THAT IS NOT CONTROLLED WITH YOUR NAUSEA MEDICATION *UNUSUAL SHORTNESS OF BREATH *UNUSUAL BRUISING OR BLEEDING *URINARY PROBLEMS (pain or burning when urinating, or frequent urination) *BOWEL PROBLEMS (unusual diarrhea, constipation, pain near the anus) TENDERNESS IN MOUTH AND THROAT WITH OR WITHOUT PRESENCE OF ULCERS (sore throat, sores in mouth, or a toothache) UNUSUAL RASH, SWELLING OR PAIN  UNUSUAL VAGINAL DISCHARGE OR ITCHING   Items with * indicate a potential emergency and should be followed up as soon as possible or go to the Emergency Department if any problems should occur.  Please show the CHEMOTHERAPY ALERT CARD or IMMUNOTHERAPY ALERT CARD at check-in to  the Emergency Department and triage nurse. Should you have questions after your visit or need to cancel or reschedule your appointment, please contact [employer]  [phone] and follow the prompts.  Office hours are 8:00 a.m. to 4:30 p.m. Monday - Friday. Please note that voicemails left after 4:00 p.m. may not be returned until the following business day.  We are closed weekends and major holidays. You have access to a nurse at all times for urgent questions. Please call the main number to the clinic [phone] and follow the prompts.  For any non-urgent questions, you may also contact your provider using MyChart. We now offer e-Visits for anyone 34 and older to request care online for non-urgent symptoms. For details visit [email or web address].   Also download the MyChart app! Go to the app store, search "MyChart", open the app, select [facility], and log in with your MyChart username and password.  Masks are optional in the cancer centers. If you would like for your care team to wear a mask while they are taking care of you, please let them know. You may have one support person who is at least 77 years old accompany you for your appointments.

## 2022-01-27 NOTE — Progress Notes (Signed)
Hematology and Oncology Follow Up Visit  Marvin Salinas [id number] 09/08/1945 76 y.o. 01/27/2022   Principle Diagnosis:  Lambda light chain myeloma --normal cytogenetics  Current Therapy:   Faspro/Velcade/Revlimid -- s/p cycle #3 -- start on 11/05/2021     Interim History:  [name] is back for follow-up.  Overall, he is doing pretty well.  His main problem has been his lower back.  It sounds like he has a herniated disc.  He does see Orthopedic Surgery for this.  He has had injections.  I think he will go back to see the spinal surgeon sometime next week or so.  He says he is having problems with pain and weakness in the left leg.  His last lambda light chain was up a little bit which is surprising.  When we last checked him, his light chain was 228 mg/L.  Then we will recheck again, it is up to 320 mg/L.  I will be very interested to see what it is now.  He has had no problems with bowels or bladder.  He has had no cough or shortness of breath.  He has had no bleeding.  He has had no problems with the Revlimid.  He has had no itching.  There is been no rashes.  Overall, I would say his performance status is probably ECOG 1.      Medications:  Current Outpatient Medications:    BD INSULIN SYRINGE U/F 31G X 5/16" 0.5 ML MISC, E.11.9- monitor blood glucose tid for IDDM, Disp: 100 each, Rfl: 4   blood glucose meter kit and supplies KIT, Dispense based on patient and insurance preference. Use up to four times daily as directed., Disp: 1 each, Rfl: 0   Carboxymethylcell-Hypromellose (GENTEAL OP), Place 1 drop into both eyes daily as needed (dry eyes)., Disp: , Rfl:    dofetilide (TIKOSYN) 125 MCG capsule, TAKE 1 CAPSULE BY MOUTH TWICE A DAY, Disp: 180 capsule, Rfl: 3   ELIQUIS 5 MG TABS tablet, TAKE 1 TABLET BY MOUTH TWICE A DAY, Disp: 180 tablet, Rfl: 1   ezetimibe (ZETIA) 10 MG tablet, Take 1 tablet (10 mg total) by mouth daily., Disp: 90 tablet, Rfl: 3   famciclovir (FAMVIR) 250 MG  tablet, Take 1 tablet (250 mg total) by mouth daily., Disp: 30 tablet, Rfl: 12   fluticasone (FLONASE) 50 MCG/ACT nasal spray, Place 2 sprays into both nostrils daily., Disp: , Rfl:    insulin glargine (LANTUS) 100 UNIT/ML injection, 30 units subcu injection in the morning and 35 units subcu injection before bed., Disp: 30 mL, Rfl: 5   Lancets (ONETOUCH DELICA PLUS [id number]) MISC, USE UP TO FOUR TIMES DAILY AS DIRECTED **EMERGENCY FILL**, Disp: 100 each, Rfl: 11   lenalidomide (REVLIMID) 10 MG capsule, Take 1 capsule (10 mg total) by mouth daily. [employer] Auth # [id number], Disp: 21 capsule, Rfl: 0   loratadine (CLARITIN) 10 MG tablet, Take 10 mg by mouth daily as needed for allergies., Disp: , Rfl:    methocarbamol (ROBAXIN) 500 MG tablet, Take 500 mg by mouth daily., Disp: , Rfl:    metoprolol tartrate (LOPRESSOR) 25 MG tablet, Take 0.5 tablets (12.5 mg total) by mouth 2 (two) times daily., Disp: 90 tablet, Rfl: 3   nitroGLYCERIN (NITROSTAT) 0.4 MG SL tablet, PLACE 1 TABLET (0.4 MG TOTAL) UNDER THE TONGUE EVERY 5 (FIVE) MINUTES AS NEEDED FOR CHEST PAIN., Disp: 25 tablet, Rfl: 12   nitroGLYCERIN (NITROSTAT) 0.4 MG SL tablet, Place 1 tablet under the  tongue every 5 (five) minutes as needed., Disp: , Rfl:    olmesartan (BENICAR) 20 MG tablet, Take 1 tablet by mouth daily., Disp: , Rfl:    olmesartan (BENICAR) 40 MG tablet, Take 40 mg by mouth daily., Disp: , Rfl:    ondansetron (ZOFRAN) 8 MG tablet, TAKE 1 TABLET (8 MG TOTAL) BY MOUTH 2 (TWO) TIMES DAILY AS NEEDED (NAUSEA OR VOMITING)., Disp: 30 tablet, Rfl: 1   ONETOUCH VERIO test strip, USE UP TO FOUR TIMES DAILY AS DIRECTED., Disp: 100 strip, Rfl: 3   polyethylene glycol powder (GLYCOLAX/MIRALAX) 17 GM/SCOOP powder, Take by mouth., Disp: , Rfl:    prochlorperazine (COMPAZINE) 10 MG tablet, Take 1 tablet (10 mg total) by mouth every 6 (six) hours as needed (Nausea or vomiting)., Disp: 30 tablet, Rfl: 1   rosuvastatin (CRESTOR) 20 MG tablet, Take 1  tablet (20 mg total) by mouth at bedtime., Disp: 90 tablet, Rfl: 3   Semaglutide (RYBELSUS) 7 MG TABS, Take 1 tablet by mouth daily with breakfast., Disp: 90 tablet, Rfl: 1  Allergies:  Allergies  Allergen Reactions   Codeine Nausea And Vomiting   Lisinopril Cough   Nsaids Other (See Comments)    CKD   Dapagliflozin Rash   Latex Hives    Past Medical History, Surgical history, Social history, and Family History were reviewed and updated.  Review of Systems: Review of Systems  Constitutional:  Positive for fatigue.  HENT:  Negative.    Eyes: Negative.   Respiratory: Negative.    Cardiovascular: Negative.   Gastrointestinal: Negative.   Endocrine: Negative.   Genitourinary: Negative.    Musculoskeletal: Negative.   Skin: Negative.   Neurological: Negative.   Hematological: Negative.   Psychiatric/Behavioral: Negative.      Physical Exam:  weight is 164 lb (74.4 kg). His oral temperature is 97.8 F (36.6 C). His blood pressure is 140/59 (abnormal) and his pulse is 63. His respiration is 18 and oxygen saturation is 100%.   Wt Readings from Last 3 Encounters:  01/27/22 164 lb (74.4 kg)  01/06/22 160 lb (72.6 kg)  12/24/21 169 lb (76.7 kg)    Physical Exam Vitals reviewed.  HENT:     Head: Normocephalic and atraumatic.  Eyes:     Pupils: Pupils are equal, round, and reactive to light.  Cardiovascular:     Rate and Rhythm: Normal rate and regular rhythm.     Heart sounds: Normal heart sounds.  Pulmonary:     Effort: Pulmonary effort is normal.     Breath sounds: Normal breath sounds.  Abdominal:     General: Bowel sounds are normal.     Palpations: Abdomen is soft.  Musculoskeletal:        General: No tenderness or deformity. Normal range of motion.     Cervical back: Normal range of motion.  Lymphadenopathy:     Cervical: No cervical adenopathy.  Skin:    General: Skin is warm and dry.     Findings: No erythema or rash.  Neurological:     Mental Status: He  is alert and oriented to person, place, and time.  Psychiatric:        Behavior: Behavior normal.        Thought Content: Thought content normal.        Judgment: Judgment normal.      Lab Results  Component Value Date   WBC 4.4 01/27/2022   HGB 11.9 (L) 01/27/2022   HCT 36.3 (L) 01/27/2022  MCV 91.0 01/27/2022   PLT 223 01/27/2022     Chemistry      Component Value Date/Time   NA 139 01/27/2022 0858   NA 140 08/31/2021 1026   K 4.2 01/27/2022 0858   CL 106 01/27/2022 0858   CO2 25 01/27/2022 0858   BUN 23 01/27/2022 0858   BUN 21 08/31/2021 1026   CREATININE 1.32 (H) 01/27/2022 0858      Component Value Date/Time   CALCIUM 9.2 01/27/2022 0858   ALKPHOS 45 01/27/2022 0858   AST 18 01/27/2022 0858   ALT 26 01/27/2022 0858   BILITOT 0.5 01/27/2022 0858      Impression and Plan: Mr. Mofield is a very nice 76 year old white male.  He has lambda light chain myeloma.  He has mild renal insufficiency.  This appears to be getting a little bit better.  I would not think that the back is anything related to myeloma.  Hopefully, he will have another MRI done when he sees the spinal surgeon.  If he does need surgery, I do not see a problem with him having surgery from my point of view.  Again, I will be very interested to see what his lambda light chain level is.  We will plan to get him back to see [place] in another month.      Volanda Napoleon, MD 8/9/202310:43 AM

## 2022-01-28 ENCOUNTER — Other Ambulatory Visit: Payer: Self-pay

## 2022-01-28 DIAGNOSIS — N189 Chronic kidney disease, unspecified: Secondary | ICD-10-CM | POA: Diagnosis not present

## 2022-01-28 DIAGNOSIS — R809 Proteinuria, unspecified: Secondary | ICD-10-CM | POA: Diagnosis not present

## 2022-01-28 DIAGNOSIS — C9000 Multiple myeloma not having achieved remission: Secondary | ICD-10-CM | POA: Diagnosis not present

## 2022-01-28 DIAGNOSIS — I129 Hypertensive chronic kidney disease with stage 1 through stage 4 chronic kidney disease, or unspecified chronic kidney disease: Secondary | ICD-10-CM | POA: Diagnosis not present

## 2022-01-28 DIAGNOSIS — D6959 Other secondary thrombocytopenia: Secondary | ICD-10-CM | POA: Diagnosis not present

## 2022-01-28 DIAGNOSIS — E1129 Type 2 diabetes mellitus with other diabetic kidney complication: Secondary | ICD-10-CM | POA: Diagnosis not present

## 2022-01-28 DIAGNOSIS — D638 Anemia in other chronic diseases classified elsewhere: Secondary | ICD-10-CM | POA: Diagnosis not present

## 2022-01-28 DIAGNOSIS — E1122 Type 2 diabetes mellitus with diabetic chronic kidney disease: Secondary | ICD-10-CM | POA: Diagnosis not present

## 2022-01-28 LAB — KAPPA/LAMBDA LIGHT CHAINS
Kappa free light chain: 20.2 mg/L — ABNORMAL HIGH (ref 3.3–19.4)
Kappa, lambda light chain ratio: 0.23 — ABNORMAL LOW (ref 0.26–1.65)
Lambda free light chains: 87.6 mg/L — ABNORMAL HIGH (ref 5.7–26.3)

## 2022-01-28 LAB — IGG, IGA, IGM
IgA: 25 mg/dL — ABNORMAL LOW (ref 61–437)
IgG (Immunoglobin G), Serum: 338 mg/dL — ABNORMAL LOW (ref 603–1613)
IgM (Immunoglobulin M), Srm: 17 mg/dL (ref 15–143)

## 2022-01-29 ENCOUNTER — Encounter: Payer: Self-pay | Admitting: *Deleted

## 2022-02-02 DIAGNOSIS — R809 Proteinuria, unspecified: Secondary | ICD-10-CM | POA: Diagnosis not present

## 2022-02-02 DIAGNOSIS — I129 Hypertensive chronic kidney disease with stage 1 through stage 4 chronic kidney disease, or unspecified chronic kidney disease: Secondary | ICD-10-CM | POA: Diagnosis not present

## 2022-02-02 DIAGNOSIS — E1129 Type 2 diabetes mellitus with other diabetic kidney complication: Secondary | ICD-10-CM | POA: Diagnosis not present

## 2022-02-02 DIAGNOSIS — N189 Chronic kidney disease, unspecified: Secondary | ICD-10-CM | POA: Diagnosis not present

## 2022-02-02 DIAGNOSIS — E1122 Type 2 diabetes mellitus with diabetic chronic kidney disease: Secondary | ICD-10-CM | POA: Diagnosis not present

## 2022-02-03 ENCOUNTER — Inpatient Hospital Stay: Payer: Medicare Other

## 2022-02-03 ENCOUNTER — Other Ambulatory Visit: Payer: Self-pay | Admitting: Cardiovascular Disease

## 2022-02-03 VITALS — BP 131/52 | HR 60 | Temp 97.7°F | Resp 18

## 2022-02-03 DIAGNOSIS — C9000 Multiple myeloma not having achieved remission: Secondary | ICD-10-CM | POA: Diagnosis not present

## 2022-02-03 DIAGNOSIS — Z79899 Other long term (current) drug therapy: Secondary | ICD-10-CM | POA: Diagnosis not present

## 2022-02-03 DIAGNOSIS — Z5112 Encounter for antineoplastic immunotherapy: Secondary | ICD-10-CM | POA: Diagnosis not present

## 2022-02-03 LAB — CBC WITH DIFFERENTIAL (CANCER CENTER ONLY)
Abs Immature Granulocytes: 0.02 10*3/uL (ref 0.00–0.07)
Basophils Absolute: 0.1 10*3/uL (ref 0.0–0.1)
Basophils Relative: 1 %
Eosinophils Absolute: 0.2 10*3/uL (ref 0.0–0.5)
Eosinophils Relative: 4 %
HCT: 35 % — ABNORMAL LOW (ref 39.0–52.0)
Hemoglobin: 11.7 g/dL — ABNORMAL LOW (ref 13.0–17.0)
Immature Granulocytes: 1 %
Lymphocytes Relative: 20 %
Lymphs Abs: 0.9 10*3/uL (ref 0.7–4.0)
MCH: 30.5 pg (ref 26.0–34.0)
MCHC: 33.4 g/dL (ref 30.0–36.0)
MCV: 91.4 fL (ref 80.0–100.0)
Monocytes Absolute: 0.3 10*3/uL (ref 0.1–1.0)
Monocytes Relative: 6 %
Neutro Abs: 3 10*3/uL (ref 1.7–7.7)
Neutrophils Relative %: 68 %
Platelet Count: 157 10*3/uL (ref 150–400)
RBC: 3.83 MIL/uL — ABNORMAL LOW (ref 4.22–5.81)
RDW: 15 % (ref 11.5–15.5)
WBC Count: 4.4 10*3/uL (ref 4.0–10.5)
nRBC: 0 % (ref 0.0–0.2)

## 2022-02-03 LAB — CMP (CANCER CENTER ONLY)
ALT: 23 U/L (ref 0–44)
AST: 16 U/L (ref 15–41)
Albumin: 3.9 g/dL (ref 3.5–5.0)
Alkaline Phosphatase: 49 U/L (ref 38–126)
Anion gap: 5 (ref 5–15)
BUN: 20 mg/dL (ref 8–23)
CO2: 27 mmol/L (ref 22–32)
Calcium: 8.9 mg/dL (ref 8.9–10.3)
Chloride: 106 mmol/L (ref 98–111)
Creatinine: 1.35 mg/dL — ABNORMAL HIGH (ref 0.61–1.24)
GFR, Estimated: 54 mL/min — ABNORMAL LOW (ref >60)
Glucose, Bld: 210 mg/dL — ABNORMAL HIGH (ref 70–99)
Potassium: 4.5 mmol/L (ref 3.5–5.1)
Sodium: 138 mmol/L (ref 135–145)
Total Bilirubin: 0.5 mg/dL (ref 0.3–1.2)
Total Protein: 5.9 g/dL — ABNORMAL LOW (ref 6.5–8.1)

## 2022-02-03 LAB — PROTEIN ELECTROPHORESIS, SERUM, WITH REFLEX
A/G Ratio: 1.6 (ref 0.7–1.7)
Albumin ELP: 3.3 g/dL (ref 2.9–4.4)
Alpha-1-Globulin: 0.2 g/dL (ref 0.0–0.4)
Alpha-2-Globulin: 0.7 g/dL (ref 0.4–1.0)
Beta Globulin: 0.8 g/dL (ref 0.7–1.3)
Gamma Globulin: 0.3 g/dL — ABNORMAL LOW (ref 0.4–1.8)
Globulin, Total: 2.1 g/dL — ABNORMAL LOW (ref 2.2–3.9)
M-Spike, %: 0.1 g/dL — ABNORMAL HIGH
SPEP Interpretation: 0
Total Protein ELP: 5.4 g/dL — ABNORMAL LOW (ref 6.0–8.5)

## 2022-02-03 LAB — IMMUNOFIXATION REFLEX, SERUM
IgA: 20 mg/dL — ABNORMAL LOW (ref 61–437)
IgG (Immunoglobin G), Serum: 340 mg/dL — ABNORMAL LOW (ref 603–1613)
IgM (Immunoglobulin M), Srm: 19 mg/dL (ref 15–143)

## 2022-02-03 MED ORDER — LORAZEPAM 1 MG PO TABS
0.5000 mg | ORAL_TABLET | Freq: Once | ORAL | Status: AC
  Administered 2022-02-03: 0.5 mg via ORAL

## 2022-02-03 MED ORDER — DEXAMETHASONE 4 MG PO TABS
4.0000 mg | ORAL_TABLET | Freq: Once | ORAL | Status: AC
  Administered 2022-02-03: 4 mg via ORAL

## 2022-02-03 MED ORDER — BORTEZOMIB CHEMO SQ INJECTION 3.5 MG (2.5MG/ML)
1.3000 mg/m2 | Freq: Once | INTRAMUSCULAR | Status: AC
  Administered 2022-02-03: 2.5 mg via SUBCUTANEOUS
  Filled 2022-02-03: qty 1

## 2022-02-03 NOTE — Patient Instructions (Signed)
[facility]  Discharge Instructions: Thank you for choosing [facility] to provide your oncology and hematology care.   If you have a lab appointment with the Cancer Center, please go directly to the Cancer Center and check in at the registration area.  Wear comfortable clothing and clothing appropriate for easy access to any Portacath or PICC line.   We strive to give you quality time with your provider. You may need to reschedule your appointment if you arrive late (15 or more minutes).  Arriving late affects you and other patients whose appointments are after yours.  Also, if you miss three or more appointments without notifying the office, you may be dismissed from the clinic at the provider's discretion.      For prescription refill requests, have your pharmacy contact our office and allow 72 hours for refills to be completed.    Today you received the following chemotherapy and/or immunotherapy agents Velcade      To help prevent nausea and vomiting after your treatment, we encourage you to take your nausea medication as directed.  BELOW ARE SYMPTOMS THAT SHOULD BE REPORTED IMMEDIATELY: *FEVER GREATER THAN 100.4 F (38 C) OR HIGHER *CHILLS OR SWEATING *NAUSEA AND VOMITING THAT IS NOT CONTROLLED WITH YOUR NAUSEA MEDICATION *UNUSUAL SHORTNESS OF BREATH *UNUSUAL BRUISING OR BLEEDING *URINARY PROBLEMS (pain or burning when urinating, or frequent urination) *BOWEL PROBLEMS (unusual diarrhea, constipation, pain near the anus) TENDERNESS IN MOUTH AND THROAT WITH OR WITHOUT PRESENCE OF ULCERS (sore throat, sores in mouth, or a toothache) UNUSUAL RASH, SWELLING OR PAIN  UNUSUAL VAGINAL DISCHARGE OR ITCHING   Items with * indicate a potential emergency and should be followed up as soon as possible or go to the Emergency Department if any problems should occur.  Please show the CHEMOTHERAPY ALERT CARD or IMMUNOTHERAPY ALERT CARD at check-in to the  Emergency Department and triage nurse. Should you have questions after your visit or need to cancel or reschedule your appointment, please contact [facility]  [phone] and follow the prompts.  Office hours are 8:00 a.m. to 4:30 p.m. Monday - Friday. Please note that voicemails left after 4:00 p.m. may not be returned until the following business day.  We are closed weekends and major holidays. You have access to a nurse at all times for urgent questions. Please call the main number to the clinic [phone] and follow the prompts.  For any non-urgent questions, you may also contact your provider using MyChart. We now offer e-Visits for anyone 18 and older to request care online for non-urgent symptoms. For details visit [email or web address].   Also download the MyChart app! Go to the app store, search "MyChart", open the app, select [facility], and log in with your MyChart username and password.  Masks are optional in the cancer centers. If you would like for your care team to wear a mask while they are taking care of you, please let them know. You may have one support person who is at least 77 years old accompany you for your appointments. 

## 2022-02-03 NOTE — Telephone Encounter (Signed)
Pt last saw Dr Acie Fredrickson 08/31/21, last labs 02/03/22 Creat 1.35, age 76, weight 74.4kg, based on specified criteria pt is on appropriate dosage of Eliquis '5mg'$  BId for afib.  Will refill rx.

## 2022-02-08 ENCOUNTER — Other Ambulatory Visit: Payer: Medicare Other

## 2022-02-08 ENCOUNTER — Ambulatory Visit: Payer: Medicare Other | Admitting: Family

## 2022-02-10 ENCOUNTER — Ambulatory Visit: Payer: Medicare Other | Admitting: *Deleted

## 2022-02-13 ENCOUNTER — Other Ambulatory Visit: Payer: Self-pay

## 2022-02-17 ENCOUNTER — Inpatient Hospital Stay: Payer: Medicare Other

## 2022-02-17 ENCOUNTER — Other Ambulatory Visit: Payer: Self-pay

## 2022-02-17 ENCOUNTER — Encounter: Payer: Self-pay | Admitting: Hematology & Oncology

## 2022-02-17 ENCOUNTER — Inpatient Hospital Stay (HOSPITAL_BASED_OUTPATIENT_CLINIC_OR_DEPARTMENT_OTHER): Payer: Medicare Other | Admitting: Hematology & Oncology

## 2022-02-17 VITALS — BP 140/53 | HR 51

## 2022-02-17 VITALS — BP 128/61 | HR 58 | Temp 97.6°F | Resp 16 | Wt 167.0 lb

## 2022-02-17 DIAGNOSIS — Z79899 Other long term (current) drug therapy: Secondary | ICD-10-CM | POA: Diagnosis not present

## 2022-02-17 DIAGNOSIS — C9000 Multiple myeloma not having achieved remission: Secondary | ICD-10-CM

## 2022-02-17 DIAGNOSIS — Z5112 Encounter for antineoplastic immunotherapy: Secondary | ICD-10-CM | POA: Diagnosis not present

## 2022-02-17 LAB — CBC WITH DIFFERENTIAL (CANCER CENTER ONLY)
Abs Immature Granulocytes: 0.01 10*3/uL (ref 0.00–0.07)
Basophils Absolute: 0.1 10*3/uL (ref 0.0–0.1)
Basophils Relative: 3 %
Eosinophils Absolute: 0.4 10*3/uL (ref 0.0–0.5)
Eosinophils Relative: 10 %
HCT: 35.5 % — ABNORMAL LOW (ref 39.0–52.0)
Hemoglobin: 11.7 g/dL — ABNORMAL LOW (ref 13.0–17.0)
Immature Granulocytes: 0 %
Lymphocytes Relative: 23 %
Lymphs Abs: 0.9 10*3/uL (ref 0.7–4.0)
MCH: 30.3 pg (ref 26.0–34.0)
MCHC: 33 g/dL (ref 30.0–36.0)
MCV: 92 fL (ref 80.0–100.0)
Monocytes Absolute: 0.5 10*3/uL (ref 0.1–1.0)
Monocytes Relative: 13 %
Neutro Abs: 1.9 10*3/uL (ref 1.7–7.7)
Neutrophils Relative %: 51 %
Platelet Count: 147 10*3/uL — ABNORMAL LOW (ref 150–400)
RBC: 3.86 MIL/uL — ABNORMAL LOW (ref 4.22–5.81)
RDW: 15.5 % (ref 11.5–15.5)
WBC Count: 3.7 10*3/uL — ABNORMAL LOW (ref 4.0–10.5)
nRBC: 0 % (ref 0.0–0.2)

## 2022-02-17 LAB — CMP (CANCER CENTER ONLY)
ALT: 21 U/L (ref 0–44)
AST: 16 U/L (ref 15–41)
Albumin: 4 g/dL (ref 3.5–5.0)
Alkaline Phosphatase: 44 U/L (ref 38–126)
Anion gap: 7 (ref 5–15)
BUN: 25 mg/dL — ABNORMAL HIGH (ref 8–23)
CO2: 28 mmol/L (ref 22–32)
Calcium: 9 mg/dL (ref 8.9–10.3)
Chloride: 104 mmol/L (ref 98–111)
Creatinine: 1.52 mg/dL — ABNORMAL HIGH (ref 0.61–1.24)
GFR, Estimated: 47 mL/min — ABNORMAL LOW (ref >60)
Glucose, Bld: 173 mg/dL — ABNORMAL HIGH (ref 70–99)
Potassium: 4.1 mmol/L (ref 3.5–5.1)
Sodium: 139 mmol/L (ref 135–145)
Total Bilirubin: 0.6 mg/dL (ref 0.3–1.2)
Total Protein: 6 g/dL — ABNORMAL LOW (ref 6.5–8.1)

## 2022-02-17 LAB — LACTATE DEHYDROGENASE: LDH: 172 U/L (ref 98–192)

## 2022-02-17 MED ORDER — ACETAMINOPHEN 325 MG PO TABS
650.0000 mg | ORAL_TABLET | Freq: Once | ORAL | Status: AC
  Administered 2022-02-17: 650 mg via ORAL
  Filled 2022-02-17: qty 2

## 2022-02-17 MED ORDER — BORTEZOMIB CHEMO SQ INJECTION 3.5 MG (2.5MG/ML)
1.3000 mg/m2 | Freq: Once | INTRAMUSCULAR | Status: AC
  Administered 2022-02-17: 2.5 mg via SUBCUTANEOUS
  Filled 2022-02-17: qty 1

## 2022-02-17 MED ORDER — LORAZEPAM 1 MG PO TABS
0.5000 mg | ORAL_TABLET | Freq: Once | ORAL | Status: AC
  Administered 2022-02-17: 0.5 mg via ORAL
  Filled 2022-02-17: qty 1

## 2022-02-17 MED ORDER — MONTELUKAST SODIUM 10 MG PO TABS: 10.0000 mg | ORAL_TABLET | Freq: Once | ORAL | Status: DC

## 2022-02-17 MED ORDER — DEXAMETHASONE 4 MG PO TABS
20.0000 mg | ORAL_TABLET | Freq: Once | ORAL | Status: AC
  Administered 2022-02-17: 20 mg via ORAL
  Filled 2022-02-17: qty 5

## 2022-02-17 MED ORDER — DIPHENHYDRAMINE HCL 25 MG PO CAPS
50.0000 mg | ORAL_CAPSULE | Freq: Once | ORAL | Status: AC
  Administered 2022-02-17: 50 mg via ORAL
  Filled 2022-02-17: qty 2

## 2022-02-17 MED ORDER — LENALIDOMIDE 10 MG PO CAPS: 10.0000 mg | ORAL_CAPSULE | Freq: Every day | ORAL | 0 refills | Status: DC

## 2022-02-17 MED ORDER — DARATUMUMAB-HYALURONIDASE-FIHJ 1800-30000 MG-UT/15ML ~~LOC~~ SOLN
1800.0000 mg | Freq: Once | SUBCUTANEOUS | Status: AC
  Administered 2022-02-17: 1800 mg via SUBCUTANEOUS
  Filled 2022-02-17: qty 15

## 2022-02-17 NOTE — Progress Notes (Signed)
Per Dr. Marin Olp ok to treat with creatinine 1.52

## 2022-02-17 NOTE — Patient Instructions (Signed)
[facility]  Discharge Instructions: Thank you for choosing [facility] to provide your oncology and hematology care.   If you have a lab appointment with the [facility], please go directly to the [facility] and check in at the registration area.  Wear comfortable clothing and clothing appropriate for easy access to any Portacath or PICC line.   We strive to give you quality time with your provider. You may need to reschedule your appointment if you arrive late (15 or more minutes).  Arriving late affects you and other patients whose appointments are after yours.  Also, if you miss three or more appointments without notifying the office, you may be dismissed from the clinic at the provider's discretion.      For prescription refill requests, have your pharmacy contact our office and allow 72 hours for refills to be completed.    Today you received the following chemotherapy and/or immunotherapy agents darzalex, velcade   To help prevent nausea and vomiting after your treatment, we encourage you to take your nausea medication as directed.  BELOW ARE SYMPTOMS THAT SHOULD BE REPORTED IMMEDIATELY: *FEVER GREATER THAN 100.4 F (38 C) OR HIGHER *CHILLS OR SWEATING *NAUSEA AND VOMITING THAT IS NOT CONTROLLED WITH YOUR NAUSEA MEDICATION *UNUSUAL SHORTNESS OF BREATH *UNUSUAL BRUISING OR BLEEDING *URINARY PROBLEMS (pain or burning when urinating, or frequent urination) *BOWEL PROBLEMS (unusual diarrhea, constipation, pain near the anus) TENDERNESS IN MOUTH AND THROAT WITH OR WITHOUT PRESENCE OF ULCERS (sore throat, sores in mouth, or a toothache) UNUSUAL RASH, SWELLING OR PAIN  UNUSUAL VAGINAL DISCHARGE OR ITCHING   Items with * indicate a potential emergency and should be followed up as soon as possible or go to the Emergency Department if any problems should occur.  Please show the CHEMOTHERAPY ALERT CARD or IMMUNOTHERAPY ALERT CARD at check-in to  the Emergency Department and triage nurse. Should you have questions after your visit or need to cancel or reschedule your appointment, please contact [facility]  [phone] and follow the prompts.  Office hours are 8:00 a.m. to 4:30 p.m. Monday - Friday. Please note that voicemails left after 4:00 p.m. may not be returned until the following business day.  We are closed weekends and major holidays. You have access to a nurse at all times for urgent questions. Please call the main number to the clinic [phone] and follow the prompts.  For any non-urgent questions, you may also contact your provider using MyChart. We now offer e-Visits for anyone 94 and older to request care online for non-urgent symptoms. For details visit [email or web address].   Also download the MyChart app! Go to the app store, search "MyChart", open the app, select [facility], and log in with your MyChart username and password.  Due to Covid, a mask is required upon entering the hospital/clinic. If you do not have a mask, one will be given to you upon arrival. For doctor visits, patients may have 1 support person aged 48 or older with them. For treatment visits, patients cannot have anyone with them due to current Covid guidelines and our immunocompromised population.

## 2022-02-17 NOTE — Progress Notes (Signed)
Hematology and Oncology Follow Up Visit  Marvin Salinas [id number] [birth year] 77 y.o. 02/17/2022   Principle Diagnosis:  Lambda light chain myeloma --normal cytogenetics  Current Therapy:   Faspro/Velcade/Revlimid -- s/p cycle #3 -- start on 11/05/2021     Interim History:  [name] is back for follow-up.  He clearly is responding.  Clearly, the Marvin Salinas is I think the key.  His lambda light chain went from 321 down to 88 mg/L.  I am very happy about this.  He is tolerated treatment quite nicely.  He has had no problems with nausea or vomiting.  He has had no cough or shortness of breath.  He has had no change in bowel or bladder habits.  There is been no problems with rashes.  He has had no leg swelling.  He has had no bleeding.  His overall quality of life seems to be doing quite nicely.  Currently, I would have said that his performance status is probably ECOG 0.     Medications:  Current Outpatient Medications:    BD INSULIN SYRINGE U/F 31G X 5/16" 0.5 ML MISC, E.11.9- monitor blood glucose tid for IDDM, Disp: 100 each, Rfl: 4   blood glucose meter kit and supplies KIT, Dispense based on patient and insurance preference. Use up to four times daily as directed., Disp: 1 each, Rfl: 0   Carboxymethylcell-Hypromellose (GENTEAL OP), Place 1 drop into both eyes daily as needed (dry eyes)., Disp: , Rfl:    dofetilide (TIKOSYN) 125 MCG capsule, TAKE 1 CAPSULE BY MOUTH TWICE A DAY, Disp: 180 capsule, Rfl: 3   ELIQUIS 5 MG TABS tablet, TAKE 1 TABLET BY MOUTH TWICE A DAY, Disp: 180 tablet, Rfl: 1   ezetimibe (ZETIA) 10 MG tablet, Take 1 tablet (10 mg total) by mouth daily., Disp: 90 tablet, Rfl: 3   famciclovir (FAMVIR) 250 MG tablet, Take 1 tablet (250 mg total) by mouth daily., Disp: 30 tablet, Rfl: 12   fluticasone (FLONASE) 50 MCG/ACT nasal spray, Place 2 sprays into both nostrils daily., Disp: , Rfl:    insulin glargine (LANTUS) 100 UNIT/ML injection, 30 units subcu injection in the  morning and 35 units subcu injection before bed., Disp: 30 mL, Rfl: 5   Lancets (ONETOUCH DELICA PLUS [id number]) MISC, USE UP TO FOUR TIMES DAILY AS DIRECTED **EMERGENCY FILL**, Disp: 100 each, Rfl: 11   lenalidomide (REVLIMID) 10 MG capsule, Take 1 capsule (10 mg total) by mouth daily. [employer] Auth # [id number], Disp: 21 capsule, Rfl: 0   loratadine (CLARITIN) 10 MG tablet, Take 10 mg by mouth daily as needed for allergies., Disp: , Rfl:    methocarbamol (ROBAXIN) 500 MG tablet, Take 500 mg by mouth daily., Disp: , Rfl:    metoprolol tartrate (LOPRESSOR) 25 MG tablet, Take 0.5 tablets (12.5 mg total) by mouth 2 (two) times daily., Disp: 90 tablet, Rfl: 3   nitroGLYCERIN (NITROSTAT) 0.4 MG SL tablet, PLACE 1 TABLET (0.4 MG TOTAL) UNDER THE TONGUE EVERY 5 (FIVE) MINUTES AS NEEDED FOR CHEST PAIN., Disp: 25 tablet, Rfl: 12   nitroGLYCERIN (NITROSTAT) 0.4 MG SL tablet, Place 1 tablet under the tongue every 5 (five) minutes as needed., Disp: , Rfl:    olmesartan (BENICAR) 40 MG tablet, Take 20 mg by mouth 2 (two) times daily., Disp: , Rfl:    ONETOUCH VERIO test strip, USE UP TO FOUR TIMES DAILY AS DIRECTED., Disp: 100 strip, Rfl: 3   polyethylene glycol powder (GLYCOLAX/MIRALAX) 17 GM/SCOOP powder, Take by mouth.,  Disp: , Rfl:    rosuvastatin (CRESTOR) 20 MG tablet, Take 1 tablet (20 mg total) by mouth at bedtime., Disp: 90 tablet, Rfl: 3   Semaglutide (RYBELSUS) 7 MG TABS, Take 1 tablet by mouth daily with breakfast., Disp: 90 tablet, Rfl: 1   olmesartan (BENICAR) 20 MG tablet, Take 1 tablet by mouth daily. (Patient not taking: Reported on 02/17/2022), Disp: , Rfl:   Allergies:  Allergies  Allergen Reactions   Codeine Nausea And Vomiting   Lisinopril Cough   Nsaids Other (See Comments)    CKD   Dapagliflozin Rash   Latex Hives    Past Medical History, Surgical history, Social history, and Family History were reviewed and updated.  Review of Systems: Review of Systems  Constitutional:   Positive for fatigue.  HENT:  Negative.    Eyes: Negative.   Respiratory: Negative.    Cardiovascular: Negative.   Gastrointestinal: Negative.   Endocrine: Negative.   Genitourinary: Negative.    Musculoskeletal: Negative.   Skin: Negative.   Neurological: Negative.   Hematological: Negative.   Psychiatric/Behavioral: Negative.      Physical Exam:  weight is 167 lb (75.8 kg). His oral temperature is 97.6 F (36.4 C). His blood pressure is 128/61 and his pulse is 58 (abnormal). His respiration is 16 and oxygen saturation is 99%.   Wt Readings from Last 3 Encounters:  02/17/22 167 lb (75.8 kg)  01/27/22 164 lb (74.4 kg)  01/06/22 160 lb (72.6 kg)    Physical Exam Vitals reviewed.  HENT:     Head: Normocephalic and atraumatic.  Eyes:     Pupils: Pupils are equal, round, and reactive to light.  Cardiovascular:     Rate and Rhythm: Normal rate and regular rhythm.     Heart sounds: Normal heart sounds.  Pulmonary:     Effort: Pulmonary effort is normal.     Breath sounds: Normal breath sounds.  Abdominal:     General: Bowel sounds are normal.     Palpations: Abdomen is soft.  Musculoskeletal:        General: No tenderness or deformity. Normal range of motion.     Cervical back: Normal range of motion.  Lymphadenopathy:     Cervical: No cervical adenopathy.  Skin:    General: Skin is warm and dry.     Findings: No erythema or rash.  Neurological:     Mental Status: He is alert and oriented to person, place, and time.  Psychiatric:        Behavior: Behavior normal.        Thought Content: Thought content normal.        Judgment: Judgment normal.     Lab Results  Component Value Date   WBC 3.7 (L) 02/17/2022   HGB 11.7 (L) 02/17/2022   HCT 35.5 (L) 02/17/2022   MCV 92.0 02/17/2022   PLT 147 (L) 02/17/2022     Chemistry      Component Value Date/Time   NA 139 02/17/2022 0757   NA 140 08/31/2021 1026   K 4.1 02/17/2022 0757   CL 104 02/17/2022 0757   CO2  28 02/17/2022 0757   BUN 25 (H) 02/17/2022 0757   BUN 21 08/31/2021 1026   CREATININE 1.52 (H) 02/17/2022 0757      Component Value Date/Time   CALCIUM 9.0 02/17/2022 0757   ALKPHOS 44 02/17/2022 0757   AST 16 02/17/2022 0757   ALT 21 02/17/2022 0757   BILITOT 0.6 02/17/2022 0757  Impression and Plan: Mr. Grigorian is a very nice 76 year old white male.  He has lambda light chain myeloma.  He has mild renal insufficiency.    Again, I will be very interesting to see what his lambda light chain is now.  I would have to hope that is lower.  Overall, he is tolerating treatment quite nicely.  This part, I am happy about.  We will plan to get him back to see [place] in a month.  He will come in next week for his Velcade.    Volanda Napoleon, MD 8/30/20238:59 AM

## 2022-02-18 ENCOUNTER — Other Ambulatory Visit: Payer: Self-pay

## 2022-02-18 LAB — IGG, IGA, IGM
IgA: 31 mg/dL — ABNORMAL LOW (ref 61–437)
IgG (Immunoglobin G), Serum: 318 mg/dL — ABNORMAL LOW (ref 603–1613)
IgM (Immunoglobulin M), Srm: 12 mg/dL — ABNORMAL LOW (ref 15–143)

## 2022-02-18 LAB — KAPPA/LAMBDA LIGHT CHAINS
Kappa free light chain: 30.1 mg/L — ABNORMAL HIGH (ref 3.3–19.4)
Kappa, lambda light chain ratio: 0.32 (ref 0.26–1.65)
Lambda free light chains: 93 mg/L — ABNORMAL HIGH (ref 5.7–26.3)

## 2022-02-19 ENCOUNTER — Other Ambulatory Visit: Payer: Self-pay

## 2022-02-20 ENCOUNTER — Other Ambulatory Visit: Payer: Self-pay

## 2022-02-24 ENCOUNTER — Ambulatory Visit: Payer: Self-pay | Admitting: *Deleted

## 2022-02-24 NOTE — Patient Outreach (Signed)
  Care Coordination   02/24/2022 Name: Marvin Salinas MRN: [id number] DOB: 10-23-1945   Care Coordination Outreach Attempts:  An unsuccessful telephone outreach was attempted today to offer the patient information about available care coordination services as a benefit of their health plan.   Follow Up Plan:  Additional outreach attempts will be made to offer the patient care coordination information and services.   Encounter Outcome:  No Answer  Care Coordination Interventions Activated:  No   Care Coordination Interventions:  No, not indicated    Raina Mina, RN Care Management Coordinator [place] Office [phone]

## 2022-02-25 ENCOUNTER — Inpatient Hospital Stay: Payer: Medicare Other

## 2022-02-25 ENCOUNTER — Inpatient Hospital Stay: Payer: Medicare Other | Attending: Family

## 2022-02-25 VITALS — BP 148/57 | HR 55 | Temp 98.2°F | Resp 18

## 2022-02-25 DIAGNOSIS — C9000 Multiple myeloma not having achieved remission: Secondary | ICD-10-CM | POA: Insufficient documentation

## 2022-02-25 DIAGNOSIS — Z79899 Other long term (current) drug therapy: Secondary | ICD-10-CM | POA: Insufficient documentation

## 2022-02-25 DIAGNOSIS — Z5112 Encounter for antineoplastic immunotherapy: Secondary | ICD-10-CM | POA: Diagnosis not present

## 2022-02-25 LAB — PROTEIN ELECTROPHORESIS, SERUM, WITH REFLEX
A/G Ratio: 1.8 — ABNORMAL HIGH (ref 0.7–1.7)
Albumin ELP: 3.5 g/dL (ref 2.9–4.4)
Alpha-1-Globulin: 0.2 g/dL (ref 0.0–0.4)
Alpha-2-Globulin: 0.7 g/dL (ref 0.4–1.0)
Beta Globulin: 0.8 g/dL (ref 0.7–1.3)
Gamma Globulin: 0.2 g/dL — ABNORMAL LOW (ref 0.4–1.8)
Globulin, Total: 2 g/dL — ABNORMAL LOW (ref 2.2–3.9)
M-Spike, %: 0.1 g/dL — ABNORMAL HIGH
SPEP Interpretation: 0
Total Protein ELP: 5.5 g/dL — ABNORMAL LOW (ref 6.0–8.5)

## 2022-02-25 LAB — CMP (CANCER CENTER ONLY)
ALT: 54 U/L — ABNORMAL HIGH (ref 0–44)
AST: 44 U/L — ABNORMAL HIGH (ref 15–41)
Albumin: 3.9 g/dL (ref 3.5–5.0)
Alkaline Phosphatase: 57 U/L (ref 38–126)
Anion gap: 7 (ref 5–15)
BUN: 28 mg/dL — ABNORMAL HIGH (ref 8–23)
CO2: 27 mmol/L (ref 22–32)
Calcium: 9.4 mg/dL (ref 8.9–10.3)
Chloride: 104 mmol/L (ref 98–111)
Creatinine: 1.68 mg/dL — ABNORMAL HIGH (ref 0.61–1.24)
GFR, Estimated: 42 mL/min — ABNORMAL LOW (ref >60)
Glucose, Bld: 291 mg/dL — ABNORMAL HIGH (ref 70–99)
Potassium: 4.4 mmol/L (ref 3.5–5.1)
Sodium: 138 mmol/L (ref 135–145)
Total Bilirubin: 0.5 mg/dL (ref 0.3–1.2)
Total Protein: 5.7 g/dL — ABNORMAL LOW (ref 6.5–8.1)

## 2022-02-25 LAB — CBC WITH DIFFERENTIAL (CANCER CENTER ONLY)
Abs Immature Granulocytes: 0.03 10*3/uL (ref 0.00–0.07)
Basophils Absolute: 0.1 10*3/uL (ref 0.0–0.1)
Basophils Relative: 2 %
Eosinophils Absolute: 0.2 10*3/uL (ref 0.0–0.5)
Eosinophils Relative: 4 %
HCT: 35.8 % — ABNORMAL LOW (ref 39.0–52.0)
Hemoglobin: 12.1 g/dL — ABNORMAL LOW (ref 13.0–17.0)
Immature Granulocytes: 1 %
Lymphocytes Relative: 23 %
Lymphs Abs: 1.1 10*3/uL (ref 0.7–4.0)
MCH: 30.6 pg (ref 26.0–34.0)
MCHC: 33.8 g/dL (ref 30.0–36.0)
MCV: 90.6 fL (ref 80.0–100.0)
Monocytes Absolute: 0.9 10*3/uL (ref 0.1–1.0)
Monocytes Relative: 19 %
Neutro Abs: 2.4 10*3/uL (ref 1.7–7.7)
Neutrophils Relative %: 51 %
Platelet Count: 163 10*3/uL (ref 150–400)
RBC: 3.95 MIL/uL — ABNORMAL LOW (ref 4.22–5.81)
RDW: 15.2 % (ref 11.5–15.5)
WBC Count: 4.7 10*3/uL (ref 4.0–10.5)
nRBC: 0 % (ref 0.0–0.2)

## 2022-02-25 LAB — IMMUNOFIXATION REFLEX, SERUM
IgA: 34 mg/dL — ABNORMAL LOW (ref 61–437)
IgG (Immunoglobin G), Serum: 341 mg/dL — ABNORMAL LOW (ref 603–1613)
IgM (Immunoglobulin M), Srm: 13 mg/dL — ABNORMAL LOW (ref 15–143)

## 2022-02-25 MED ORDER — LORAZEPAM 1 MG PO TABS
0.5000 mg | ORAL_TABLET | Freq: Once | ORAL | Status: AC
  Administered 2022-02-25: 0.5 mg via ORAL
  Filled 2022-02-25: qty 1

## 2022-02-25 MED ORDER — DEXAMETHASONE 4 MG PO TABS
4.0000 mg | ORAL_TABLET | Freq: Once | ORAL | Status: AC
  Administered 2022-02-25: 4 mg via ORAL
  Filled 2022-02-25: qty 1

## 2022-02-25 MED ORDER — BORTEZOMIB CHEMO SQ INJECTION 3.5 MG (2.5MG/ML)
1.3000 mg/m2 | Freq: Once | INTRAMUSCULAR | Status: AC
  Administered 2022-02-25: 2.5 mg via SUBCUTANEOUS
  Filled 2022-02-25: qty 1

## 2022-02-25 NOTE — Progress Notes (Signed)
OK to treat with the serum creatinine level from today per Dr. Marin Olp.

## 2022-03-02 ENCOUNTER — Telehealth: Payer: Self-pay | Admitting: Family Medicine

## 2022-03-02 ENCOUNTER — Encounter: Payer: Self-pay | Admitting: *Deleted

## 2022-03-02 ENCOUNTER — Ambulatory Visit: Payer: Self-pay | Admitting: *Deleted

## 2022-03-02 NOTE — Patient Instructions (Signed)
Visit Information  Thank you for taking time to visit with me today. Please don't hesitate to contact me if I can be of assistance to you.   Following are the goals we discussed today:   Goals Addressed               This Visit's Progress     Diabetes management (pt-stated)        Care Coordination Interventions: Provided education to patient about basic DM disease process Reviewed medications with patient and discussed importance of medication adherence Counseled on importance of regular laboratory monitoring as prescribed Discussed plans with patient for ongoing care management follow up and provided patient with direct contact information for care management team Reviewed scheduled/upcoming provider appointments including: pending appointments with [facility] schedule on 03/19/2022 along with A1C indicated by pt on this visit. Screening for signs and symptoms of depression related to chronic disease state  Assessed social determinant of health barriers         Our next appointment is by telephone on Oct 10 at 11:00 AM  Please call the care guide team at [phone] if you need to cancel or reschedule your appointment.   If you are experiencing a Mental Health or [place] or need someone to talk to, please call the Suicide and Crisis Lifeline: 988  Patient verbalizes understanding of instructions and care plan provided today and agrees to view in [facility]. Active MyChart status and patient understanding of how to access instructions and care plan via MyChart confirmed with patient.     Telephone follow up appointment with care management team member scheduled for: The patient has been provided with contact information for the care management team and has been advised to call with any health related questions or concerns.   Raina Mina, RN Care Management Coordinator [facility] Office [phone]

## 2022-03-02 NOTE — Telephone Encounter (Signed)
Pt is scheduled for Medicare well 09/29

## 2022-03-02 NOTE — Patient Outreach (Signed)
  Care Coordination   Initial Visit Note   03/02/2022 Name: Marvin Salinas MRN: [id number] DOB: 04/30/1946  Marvin Salinas is Salinas 76 y.o. year old male who sees Kuneff, Renee A, DO for primary care. I spoke with  Marvin Salinas by phone today.  What matters to the patients health and wellness today?  Diabetes    Goals Addressed               This Visit's Progress     Diabetes management (pt-stated)        Care Coordination Interventions: Provided education to patient about basic DM disease process Reviewed medications with patient and discussed importance of medication adherence Counseled on importance of regular laboratory monitoring as prescribed Discussed plans with patient for ongoing care management follow up and provided patient with direct contact information for care management team Reviewed scheduled/upcoming provider appointments including: pending appointments with [facility] schedule on 03/19/2022 along with A1C indicated by pt on this visit. Screening for signs and symptoms of depression related to chronic disease state  Assessed social determinant of health barriers         SDOH assessments and interventions completed:  Yes  SDOH Interventions Today    Flowsheet Row Most Recent Value  SDOH Interventions   Food Insecurity Interventions Intervention Not Indicated  Housing Interventions Intervention Not Indicated  Transportation Interventions Intervention Not Indicated        Care Coordination Interventions Activated:  Yes  Care Coordination Interventions:  Yes, provided   Follow up plan: Follow up call scheduled for 03/30/2022 Salinas 11:00 AM    Encounter Outcome:  Pt. Visit Completed   Marvin Mina, RN Care Management Coordinator [place] Office [phone]

## 2022-03-03 ENCOUNTER — Other Ambulatory Visit: Payer: Self-pay

## 2022-03-06 ENCOUNTER — Other Ambulatory Visit: Payer: Self-pay | Admitting: Family Medicine

## 2022-03-06 DIAGNOSIS — R4781 Slurred speech: Secondary | ICD-10-CM | POA: Diagnosis not present

## 2022-03-06 DIAGNOSIS — R531 Weakness: Secondary | ICD-10-CM | POA: Diagnosis not present

## 2022-03-17 ENCOUNTER — Inpatient Hospital Stay: Payer: Medicare Other

## 2022-03-17 ENCOUNTER — Encounter: Payer: Self-pay | Admitting: Hematology & Oncology

## 2022-03-17 ENCOUNTER — Inpatient Hospital Stay (HOSPITAL_BASED_OUTPATIENT_CLINIC_OR_DEPARTMENT_OTHER): Payer: Medicare Other | Admitting: Hematology & Oncology

## 2022-03-17 ENCOUNTER — Other Ambulatory Visit: Payer: Self-pay | Admitting: *Deleted

## 2022-03-17 ENCOUNTER — Other Ambulatory Visit: Payer: Self-pay

## 2022-03-17 VITALS — BP 147/74 | HR 50 | Temp 97.7°F | Resp 18

## 2022-03-17 DIAGNOSIS — C9000 Multiple myeloma not having achieved remission: Secondary | ICD-10-CM

## 2022-03-17 DIAGNOSIS — Z79899 Other long term (current) drug therapy: Secondary | ICD-10-CM | POA: Diagnosis not present

## 2022-03-17 DIAGNOSIS — Z5112 Encounter for antineoplastic immunotherapy: Secondary | ICD-10-CM | POA: Diagnosis not present

## 2022-03-17 LAB — CBC WITH DIFFERENTIAL (CANCER CENTER ONLY)
Abs Immature Granulocytes: 0.01 10*3/uL (ref 0.00–0.07)
Basophils Absolute: 0.1 10*3/uL (ref 0.0–0.1)
Basophils Relative: 3 %
Eosinophils Absolute: 0.4 10*3/uL (ref 0.0–0.5)
Eosinophils Relative: 12 %
HCT: 36.8 % — ABNORMAL LOW (ref 39.0–52.0)
Hemoglobin: 12.3 g/dL — ABNORMAL LOW (ref 13.0–17.0)
Immature Granulocytes: 0 %
Lymphocytes Relative: 31 %
Lymphs Abs: 1.1 10*3/uL (ref 0.7–4.0)
MCH: 30.3 pg (ref 26.0–34.0)
MCHC: 33.4 g/dL (ref 30.0–36.0)
MCV: 90.6 fL (ref 80.0–100.0)
Monocytes Absolute: 0.5 10*3/uL (ref 0.1–1.0)
Monocytes Relative: 13 %
Neutro Abs: 1.5 10*3/uL — ABNORMAL LOW (ref 1.7–7.7)
Neutrophils Relative %: 41 %
Platelet Count: 95 10*3/uL — ABNORMAL LOW (ref 150–400)
RBC: 4.06 MIL/uL — ABNORMAL LOW (ref 4.22–5.81)
RDW: 15.6 % — ABNORMAL HIGH (ref 11.5–15.5)
WBC Count: 3.7 10*3/uL — ABNORMAL LOW (ref 4.0–10.5)
nRBC: 0 % (ref 0.0–0.2)

## 2022-03-17 LAB — CMP (CANCER CENTER ONLY)
ALT: 22 U/L (ref 0–44)
AST: 21 U/L (ref 15–41)
Albumin: 4.1 g/dL (ref 3.5–5.0)
Alkaline Phosphatase: 41 U/L (ref 38–126)
Anion gap: 7 (ref 5–15)
BUN: 18 mg/dL (ref 8–23)
CO2: 30 mmol/L (ref 22–32)
Calcium: 9.6 mg/dL (ref 8.9–10.3)
Chloride: 104 mmol/L (ref 98–111)
Creatinine: 1.61 mg/dL — ABNORMAL HIGH (ref 0.61–1.24)
GFR, Estimated: 44 mL/min — ABNORMAL LOW (ref >60)
Glucose, Bld: 96 mg/dL (ref 70–99)
Potassium: 4.3 mmol/L (ref 3.5–5.1)
Sodium: 141 mmol/L (ref 135–145)
Total Bilirubin: 0.6 mg/dL (ref 0.3–1.2)
Total Protein: 6.1 g/dL — ABNORMAL LOW (ref 6.5–8.1)

## 2022-03-17 MED ORDER — DIPHENHYDRAMINE HCL 25 MG PO CAPS
50.0000 mg | ORAL_CAPSULE | Freq: Once | ORAL | Status: AC
  Administered 2022-03-17: 50 mg via ORAL
  Filled 2022-03-17: qty 2

## 2022-03-17 MED ORDER — LENALIDOMIDE 10 MG PO CAPS: 10.0000 mg | ORAL_CAPSULE | Freq: Every day | ORAL | 0 refills | Status: DC

## 2022-03-17 MED ORDER — DARATUMUMAB-HYALURONIDASE-FIHJ 1800-30000 MG-UT/15ML ~~LOC~~ SOLN
1800.0000 mg | Freq: Once | SUBCUTANEOUS | Status: AC
  Administered 2022-03-17: 1800 mg via SUBCUTANEOUS
  Filled 2022-03-17: qty 15

## 2022-03-17 MED ORDER — BORTEZOMIB CHEMO SQ INJECTION 3.5 MG (2.5MG/ML)
1.3000 mg/m2 | Freq: Once | INTRAMUSCULAR | Status: AC
  Administered 2022-03-17: 2.5 mg via SUBCUTANEOUS
  Filled 2022-03-17: qty 1

## 2022-03-17 MED ORDER — LORAZEPAM 1 MG PO TABS
0.5000 mg | ORAL_TABLET | Freq: Once | ORAL | Status: AC
  Administered 2022-03-17: 0.5 mg via ORAL
  Filled 2022-03-17: qty 1

## 2022-03-17 MED ORDER — DEXAMETHASONE 4 MG PO TABS
20.0000 mg | ORAL_TABLET | Freq: Once | ORAL | Status: AC
  Administered 2022-03-17: 20 mg via ORAL
  Filled 2022-03-17: qty 5

## 2022-03-17 MED ORDER — ACETAMINOPHEN 325 MG PO TABS
650.0000 mg | ORAL_TABLET | Freq: Once | ORAL | Status: AC
  Administered 2022-03-17: 650 mg via ORAL
  Filled 2022-03-17: qty 2

## 2022-03-17 NOTE — Progress Notes (Signed)
Hematology and Oncology Follow Up Visit  Marvin Salinas [id number] 06/24/1945 76 y.o. 03/17/2022   Principle Diagnosis:  Lambda light chain myeloma --normal cytogenetics  Current Therapy:   Faspro/Velcade/Revlimid -- s/p cycle #4 -- start on 11/05/2021     Interim History:  Marvin Salinas is back for follow-up.  So far, he is doing pretty well.  I think he is tolerated treatment nicely.  His blood sugars are still on the higher side.  He has had a nice response to treatment.  His last lambda light chain was 93 mg/L.  We will see what today's level is.  He has had a little bit of itching.  This is mostly on his upper back.  He has had no problems with diarrhea.  He has had some constipation.  There has been no cough or shortness of breath.  He has had no fever.  There has been no leg swelling.  He has had no problems bleeding.  He is on Eliquis because of atrial fibrillation.  His appetite is doing nicely.  Again, there is been no nausea or vomiting.  Overall, I would say his performance status is probably ECOG 1.    Medications:  Current Outpatient Medications:    BD INSULIN SYRINGE U/F 31G X 5/16" 0.5 ML MISC, MONITOR BLOOD GLUCOSE 3 TIMES DAILY, Disp: 100 each, Rfl: 4   blood glucose meter kit and supplies KIT, Dispense based on patient and insurance preference. Use up to four times daily as directed., Disp: 1 each, Rfl: 0   Carboxymethylcell-Hypromellose (GENTEAL OP), Place 1 drop into both eyes daily as needed (dry eyes)., Disp: , Rfl:    dofetilide (TIKOSYN) 125 MCG capsule, TAKE 1 CAPSULE BY MOUTH TWICE A DAY, Disp: 180 capsule, Rfl: 3   ELIQUIS 5 MG TABS tablet, TAKE 1 TABLET BY MOUTH TWICE A DAY, Disp: 180 tablet, Rfl: 1   ezetimibe (ZETIA) 10 MG tablet, Take 1 tablet (10 mg total) by mouth daily., Disp: 90 tablet, Rfl: 3   famciclovir (FAMVIR) 250 MG tablet, Take 1 tablet (250 mg total) by mouth daily., Disp: 30 tablet, Rfl: 12   fluticasone (FLONASE) 50 MCG/ACT nasal spray,  Place 2 sprays into both nostrils daily., Disp: , Rfl:    insulin glargine (LANTUS) 100 UNIT/ML injection, 30 units subcu injection in the morning and 35 units subcu injection before bed., Disp: 30 mL, Rfl: 5   Lancets (ONETOUCH DELICA PLUS LANCET33G) MISC, USE UP TO FOUR TIMES DAILY AS DIRECTED **EMERGENCY FILL**, Disp: 100 each, Rfl: 11   lenalidomide (REVLIMID) 10 MG capsule, Take 1 capsule (10 mg total) by mouth daily. [employer] Auth # [id number], Disp: 21 capsule, Rfl: 0   loratadine (CLARITIN) 10 MG tablet, Take 10 mg by mouth daily as needed for allergies., Disp: , Rfl:    methocarbamol (ROBAXIN) 500 MG tablet, Take 500 mg by mouth daily., Disp: , Rfl:    metoprolol tartrate (LOPRESSOR) 25 MG tablet, Take 0.5 tablets (12.5 mg total) by mouth 2 (two) times daily., Disp: 90 tablet, Rfl: 3   nitroGLYCERIN (NITROSTAT) 0.4 MG SL tablet, PLACE 1 TABLET (0.4 MG TOTAL) UNDER THE TONGUE EVERY 5 (FIVE) MINUTES AS NEEDED FOR CHEST PAIN., Disp: 25 tablet, Rfl: 12   nitroGLYCERIN (NITROSTAT) 0.4 MG SL tablet, Place 1 tablet under the tongue every 5 (five) minutes as needed., Disp: , Rfl:    olmesartan (BENICAR) 20 MG tablet, Take 1 tablet by mouth daily. (Patient not taking: Reported on 02/17/2022), Disp: , Rfl:      olmesartan (BENICAR) 40 MG tablet, Take 20 mg by mouth 2 (two) times daily., Disp: , Rfl:    ONETOUCH VERIO test strip, USE UP TO FOUR TIMES DAILY AS DIRECTED., Disp: 100 strip, Rfl: 3   polyethylene glycol powder (GLYCOLAX/MIRALAX) 17 GM/SCOOP powder, Take by mouth., Disp: , Rfl:    rosuvastatin (CRESTOR) 20 MG tablet, Take 1 tablet (20 mg total) by mouth at bedtime., Disp: 90 tablet, Rfl: 3   Semaglutide (RYBELSUS) 7 MG TABS, Take 1 tablet by mouth daily with breakfast., Disp: 90 tablet, Rfl: 1  Allergies:  Allergies  Allergen Reactions   Codeine Nausea And Vomiting   Lisinopril Cough   Nsaids Other (See Comments)    CKD   Dapagliflozin Rash   Latex Hives    Past Medical History,  Surgical history, Social history, and Family History were reviewed and updated.  Review of Systems: Review of Systems  Constitutional:  Positive for fatigue.  HENT:  Negative.    Eyes: Negative.   Respiratory: Negative.    Cardiovascular: Negative.   Gastrointestinal: Negative.   Endocrine: Negative.   Genitourinary: Negative.    Musculoskeletal: Negative.   Skin: Negative.   Neurological: Negative.   Hematological: Negative.   Psychiatric/Behavioral: Negative.      Physical Exam:  vitals were not taken for this visit.   Wt Readings from Last 3 Encounters:  02/17/22 167 lb (75.8 kg)  01/27/22 164 lb (74.4 kg)  01/06/22 160 lb (72.6 kg)    Physical Exam Vitals reviewed.  HENT:     Head: Normocephalic and atraumatic.  Eyes:     Pupils: Pupils are equal, round, and reactive to light.  Cardiovascular:     Rate and Rhythm: Normal rate and regular rhythm.     Heart sounds: Normal heart sounds.  Pulmonary:     Effort: Pulmonary effort is normal.     Breath sounds: Normal breath sounds.  Abdominal:     General: Bowel sounds are normal.     Palpations: Abdomen is soft.  Musculoskeletal:        General: No tenderness or deformity. Normal range of motion.     Cervical back: Normal range of motion.  Lymphadenopathy:     Cervical: No cervical adenopathy.  Skin:    General: Skin is warm and dry.     Findings: No erythema or rash.  Neurological:     Mental Status: He is alert and oriented to person, place, and time.  Psychiatric:        Behavior: Behavior normal.        Thought Content: Thought content normal.        Judgment: Judgment normal.      Lab Results  Component Value Date   WBC 4.7 02/25/2022   HGB 12.1 (L) 02/25/2022   HCT 35.8 (L) 02/25/2022   MCV 90.6 02/25/2022   PLT 163 02/25/2022     Chemistry      Component Value Date/Time   NA 138 02/25/2022 0753   NA 140 08/31/2021 1026   K 4.4 02/25/2022 0753   CL 104 02/25/2022 0753   CO2 27 02/25/2022  0753   BUN 28 (H) 02/25/2022 0753   BUN 21 08/31/2021 1026   CREATININE 1.68 (H) 02/25/2022 0753      Component Value Date/Time   CALCIUM 9.4 02/25/2022 0753   ALKPHOS 57 02/25/2022 0753   AST 44 (H) 02/25/2022 0753   ALT 54 (H) 02/25/2022 0753   BILITOT 0.5 02/25/2022 0753        Impression and Plan: Marvin Salinas is a very nice 76-year-old white male.  He has lambda light chain myeloma.  He has mild renal insufficiency.    We will continue to follow his lambda light chains.  We will proceed with his fourth cycle of treatment today.  I do not see that we have to do a bone marrow on him.  We will follow his light chains along.  We will plan to get him back in 4 weeks for follow-up.    Peter R Ennever, MD 9/27/20238:02 AM  

## 2022-03-17 NOTE — Patient Instructions (Addendum)
Daratumumab Injection What is this medication? DARATUMUMAB (dar a toom ue mab) treats multiple myeloma, a type of bone marrow cancer. It works by helping your immune system slow or stop the spread of cancer cells. It is a monoclonal antibody. This medicine may be used for other purposes; ask your health care provider or pharmacist if you have questions. COMMON BRAND NAME(S): DARZALEX What should I tell my care team before I take this medication? They need to know if you have any of these conditions: Hereditary fructose intolerance Infection, such as chickenpox, herpes, hepatitis B virus Lung or breathing disease, such as asthma, COPD An unusual or allergic reaction to daratumumab, sorbitol, other medications, foods, dyes, or preservatives Pregnant or trying to get pregnant Breast-feeding How should I use this medication? This medication is injected into a vein. It is given by your care team in a hospital or clinic setting. Talk to your care team about the use of this medication in children. Special care may be needed. Overdosage: If you think you have taken too much of this medicine contact a poison control center or emergency room at once. NOTE: This medicine is only for you. Do not share this medicine with others. What if I miss a dose? Keep appointments for follow-up doses. It is important not to miss your dose. Call your care team if you are unable to keep an appointment. What may interact with this medication? Interactions have not been studied. This list may not describe all possible interactions. Give your health care provider a list of all the medicines, herbs, non-prescription drugs, or dietary supplements you use. Also tell them if you smoke, drink alcohol, or use illegal drugs. Some items may interact with your medicine. What should I watch for while using this medication? Your condition will be monitored carefully while you are receiving this medication. This medication can  cause serious allergic reactions. To reduce your risk, your care team may give you other medication to take before receiving this one. Be sure to follow the directions from your care team. This medication can affect the results of blood tests to match your blood type. These changes can last for up to 6 months after the final dose. Your care team will do blood tests to match your blood type before you start treatment. Tell all of your care team that you are being treated with this medication before receiving a blood transfusion. This medication can affect the results of some tests used to determine treatment response; extra tests may be needed to evaluate response. Talk to your care team if you wish to become pregnant or think you are pregnant. This medication can cause serious birth defects if taken during pregnancy and for 3 months after the last dose. A reliable form of contraception is recommended while taking this medication and for 3 months after the last dose. Talk to your care team about effective forms of contraception. Do not breast-feed while taking this medication. What side effects may I notice from receiving this medication? Side effects that you should report to your care team as soon as possible: Allergic reactions--skin rash, itching, hives, swelling of the face, lips, tongue, or throat Infection--fever, chills, cough, sore throat, wounds that don't heal, pain or trouble when passing urine, general feeling of discomfort or being unwell Infusion reactions--chest pain, shortness of breath or trouble breathing, feeling faint or lightheaded Unusual bruising or bleeding Side effects that usually do not require medical attention (report to your care team if they continue  or are bothersome): Constipation Diarrhea Fatigue Nausea Pain, tingling, or numbness in the hands or feet Swelling of the ankles, hands, or feet This list may not describe all possible side effects. Call your doctor for  medical advice about side effects. You may report side effects to FDA at 1-800-FDA-1088. Where should I keep my medication? This medication is given in a hospital or clinic. It will not be stored at home. NOTE: This sheet is a summary. It may not cover all possible information. If you have questions about this medicine, talk to your doctor, pharmacist, or health care provider.  2023 Elsevier/Gold Standard (2014-05-06 00:00:00)   Bortezomib Injection What is this medication? BORTEZOMIB (bor TEZ oh mib) treats lymphoma. It may also be used to treat multiple myeloma, a type of bone marrow cancer. It works by blocking a protein that causes cancer cells to grow and multiply. This helps to slow or stop the spread of cancer cells. This medicine may be used for other purposes; ask your health care provider or pharmacist if you have questions. COMMON BRAND NAME(S): Velcade What should I tell my care team before I take this medication? They need to know if you have any of these conditions: Dehydration Diabetes Heart disease Liver disease Tingling of the fingers or toes or other nerve disorder An unusual or allergic reaction to bortezomib, other medications, foods, dyes, or preservatives If you or your partner are pregnant or trying to get pregnant Breastfeeding How should I use this medication? This medication is injected into a vein or under the skin. It is given by your care team in a hospital or clinic setting. Talk to your care team about the use of this medication in children. Special care may be needed. Overdosage: If you think you have taken too much of this medicine contact a poison control center or emergency room at once. NOTE: This medicine is only for you. Do not share this medicine with others. What if I miss a dose? Keep appointments for follow-up doses. It is important not to miss your dose. Call your care team if you are unable to keep an appointment. What may interact with this  medication? Ketoconazole Rifampin This list may not describe all possible interactions. Give your health care provider a list of all the medicines, herbs, non-prescription drugs, or dietary supplements you use. Also tell them if you smoke, drink alcohol, or use illegal drugs. Some items may interact with your medicine. What should I watch for while using this medication? Your condition will be monitored carefully while you are receiving this medication. You may need blood work while taking this medication. This medication may affect your coordination, reaction time, or judgment. Do not drive or operate machinery until you know how this medication affects you. Sit up or stand slowly to reduce the risk of dizzy or fainting spells. Drinking alcohol with this medication can increase the risk of these side effects. This medication may increase your risk of getting an infection. Call your care team for advice if you get a fever, chills, sore throat, or other symptoms of a cold or flu. Do not treat yourself. Try to avoid being around people who are sick. Check with your care team if you have severe diarrhea, nausea, and vomiting, or if you sweat a lot. The loss of too much body fluid may make it dangerous for you to take this medication. Talk to your care team if you may be pregnant. Serious birth defects can occur if you  take this medication during pregnancy and for 7 months after the last dose. You will need a negative pregnancy test before starting this medication. Contraception is recommended while taking this medication and for 7 months after the last dose. Your care team can help you find the option that works for you. If your partner can get pregnant, use a condom during sex while taking this medication and for 4 months after the last dose. Do not breastfeed while taking this medication and for 2 months after the last dose. This medication may cause infertility. Talk to your care team if you are  concerned about your fertility. What side effects may I notice from receiving this medication? Side effects that you should report to your care team as soon as possible: Allergic reactions--skin rash, itching, hives, swelling of the face, lips, tongue, or throat Bleeding--bloody or black, tar-like stools, vomiting blood or brown material that looks like coffee grounds, red or dark brown urine, small red or purple spots on skin, unusual bruising or bleeding Bleeding in the brain--severe headache, stiff neck, confusion, dizziness, change in vision, numbness or weakness of the face, arm, or leg, trouble speaking, trouble walking, vomiting Bowel blockage--stomach cramping, unable to have a bowel movement or pass gas, loss of appetite, vomiting Heart failure--shortness of breath, swelling of the ankles, feet, or hands, sudden weight gain, unusual weakness or fatigue Infection--fever, chills, cough, sore throat, wounds that don't heal, pain or trouble when passing urine, general feeling of discomfort or being unwell Liver injury--right upper belly pain, loss of appetite, nausea, light-colored stool, dark yellow or brown urine, yellowing skin or eyes, unusual weakness or fatigue Low blood pressure--dizziness, feeling faint or lightheaded, blurry vision Lung injury--shortness of breath or trouble breathing, cough, spitting up blood, chest pain, fever Pain, tingling, or numbness in the hands or feet Severe or prolonged diarrhea Stomach pain, bloody diarrhea, pale skin, unusual weakness or fatigue, decrease in the amount of urine, which may be signs of hemolytic uremic syndrome Sudden and severe headache, confusion, change in vision, seizures, which may be signs of posterior reversible encephalopathy syndrome (PRES) TTP--purple spots on the skin or inside the mouth, pale skin, yellowing skin or eyes, unusual weakness or fatigue, fever, fast or irregular heartbeat, confusion, change in vision, trouble speaking,  trouble walking Tumor lysis syndrome (TLS)--nausea, vomiting, diarrhea, decrease in the amount of urine, dark urine, unusual weakness or fatigue, confusion, muscle pain or cramps, fast or irregular heartbeat, joint pain Side effects that usually do not require medical attention (report to your care team if they continue or are bothersome): Constipation Diarrhea Fatigue Loss of appetite Nausea This list may not describe all possible side effects. Call your doctor for medical advice about side effects. You may report side effects to FDA at 1-800-FDA-1088. Where should I keep my medication? This medication is given in a hospital or clinic. It will not be stored at home. NOTE: This sheet is a summary. It may not cover all possible information. If you have questions about this medicine, talk to your doctor, pharmacist, or health care provider.  2023 Elsevier/Gold Standard (2021-11-04 00:00:00)

## 2022-03-17 NOTE — Progress Notes (Signed)
Cbc and cmet reviewed with MD, ok to treat despite counts. 

## 2022-03-18 ENCOUNTER — Other Ambulatory Visit: Payer: Self-pay

## 2022-03-19 ENCOUNTER — Ambulatory Visit (INDEPENDENT_AMBULATORY_CARE_PROVIDER_SITE_OTHER): Payer: Medicare Other

## 2022-03-19 ENCOUNTER — Other Ambulatory Visit: Payer: Self-pay

## 2022-03-19 DIAGNOSIS — Z0000 Encounter for general adult medical examination without abnormal findings: Secondary | ICD-10-CM | POA: Diagnosis not present

## 2022-03-19 NOTE — Progress Notes (Signed)
Subjective:   Marvin Salinas is a 76 y.o. male who presents for Medicare Annual/Subsequent preventive examination.  I connected with  [name] on 03/19/22 by an audio only telemedicine application and verified that I am speaking with the correct person using two identifiers.   I discussed the limitations, risks, security and privacy concerns of performing an evaluation and management service by telephone and the availability of in person appointments. I also discussed with the patient that there may be a patient responsible charge related to this service. The patient expressed understanding and verbally consented to this telephonic visit.  Location of Patient: home Location of Provider: office  List any persons and their role that are participating in the visit with the patient.   [place], CMA   Review of Systems    Defer to PCP Cardiac Risk Factors include: advanced age (>101mn, >>79women);diabetes mellitus;male gender;hypertension;smoking/ tobacco exposure     Objective:    Today's Vitals   03/19/22 1317  PainSc: 7    There is no height or weight on file to calculate BMI.     03/19/2022    2:05 PM 03/17/2022    8:05 AM 02/17/2022    8:13 AM 01/27/2022    9:18 AM 12/24/2021    9:40 AM 11/26/2021   11:09 AM 10/28/2021   12:03 PM  Advanced Directives  Does Patient Have a Medical Advance Directive? No No No No  No No  Would patient like information on creating a medical advance directive? No - Patient declined No - Patient declined No - Patient declined No - Patient declined No - Patient declined No - Patient declined No - Patient declined    Current Medications (verified) Outpatient Encounter Medications as of 03/19/2022  Medication Sig   BD INSULIN SYRINGE U/F 31G X 5/16" 0.5 ML MISC MONITOR BLOOD GLUCOSE 3 TIMES DAILY   blood glucose meter kit and supplies KIT Dispense based on patient and insurance preference. Use up to four times daily as directed.    Carboxymethylcell-Hypromellose (GENTEAL OP) Place 1 drop into both eyes daily as needed (dry eyes).   dofetilide (TIKOSYN) 125 MCG capsule TAKE 1 CAPSULE BY MOUTH TWICE A DAY   ELIQUIS 5 MG TABS tablet TAKE 1 TABLET BY MOUTH TWICE A DAY   ezetimibe (ZETIA) 10 MG tablet Take 1 tablet (10 mg total) by mouth daily.   famciclovir (FAMVIR) 250 MG tablet Take 1 tablet (250 mg total) by mouth daily.   fluticasone (FLONASE) 50 MCG/ACT nasal spray Place 2 sprays into both nostrils daily.   insulin glargine (LANTUS) 100 UNIT/ML injection 30 units subcu injection in the morning and 35 units subcu injection before bed.   Lancets (ONETOUCH DELICA PLUS [id number] MISC USE UP TO FOUR TIMES DAILY AS DIRECTED **EMERGENCY FILL**   lenalidomide (REVLIMID) 10 MG capsule Take 1 capsule (10 mg total) by mouth daily. [employer] Auth # [id number]  loratadine (CLARITIN) 10 MG tablet Take 10 mg by mouth daily as needed for allergies.   methocarbamol (ROBAXIN) 500 MG tablet Take 500 mg by mouth daily.   metoprolol tartrate (LOPRESSOR) 25 MG tablet Take 0.5 tablets (12.5 mg total) by mouth 2 (two) times daily.   nitroGLYCERIN (NITROSTAT) 0.4 MG SL tablet PLACE 1 TABLET (0.4 MG TOTAL) UNDER THE TONGUE EVERY 5 (FIVE) MINUTES AS NEEDED FOR CHEST PAIN.   nitroGLYCERIN (NITROSTAT) 0.4 MG SL tablet Place 1 tablet under the tongue every 5 (five) minutes as needed.  olmesartan (BENICAR) 40 MG tablet Take 20 mg by mouth 2 (two) times daily.   ONETOUCH VERIO test strip USE UP TO FOUR TIMES DAILY AS DIRECTED.   polyethylene glycol powder (GLYCOLAX/MIRALAX) 17 GM/SCOOP powder Take by mouth.   rosuvastatin (CRESTOR) 20 MG tablet Take 1 tablet (20 mg total) by mouth at bedtime.   Semaglutide (RYBELSUS) 7 MG TABS Take 1 tablet by mouth daily with breakfast.   [DISCONTINUED] prochlorperazine (COMPAZINE) 10 MG tablet Take 1 tablet (10 mg total) by mouth every 6 (six) hours as needed (Nausea or vomiting).   No facility-administered  encounter medications on file as of 03/19/2022.    Allergies (verified) Codeine, Lisinopril, Nsaids, Dapagliflozin, and Latex   History: Past Medical History:  Diagnosis Date   Acute kidney injury superimposed on chronic kidney disease ([place]) 10/12/2017   Atrial fibrillation with RVR ([place]) 10/27/2017   Back pain    Basilar artery stenosis    on chronic Plavix   Benign prostatic hypertrophy without urinary obstruction 04/17/2014   Carotid artery disease ([place]) 10/06/2010   Carotid US 04/2019: Bilat ICA 40-59; L subclavian stenosis  // Carotid US 11/21: Bilat ICA 40-59; bilateral subclavian stenosis // Carotid US 11/22: Bilateral ICA 40-59; left subclavian stenosis   Cerebral vascular disease    with prior TIA's; followed by Dr. Erling Cruz   Depression, neurotic 11/21/2013   Diabetes mellitus    on insulin   Enthesopathy of ankle and tarsus 09/04/2007   Overview:  Metatarsalgia    Enthesopathy of ankle and tarsus 09/04/2007   Overview:  Metatarsalgia  Formatting of this note might be different from the original. Metatarsalgia  10/1 IMO update   History of renal calculi    Hyperlipidemia    Hypertension    Ischemic heart disease    prior PCI to RCA in 1989. S/P PCI to LAD and OM in 1992. S/P PCI to first DX in 2000. S/P CABG x 3 in May 2011   Lambda light chain myeloma ([place]) 10/28/2021   Left hip pain 01/03/2020   OSA (obstructive sleep apnea) 01/11/2018   AHI 18.1 and SaO2 low 73%   PAD (peripheral artery disease) ([place]) 11/18/2017   ABIs/Arterial US 04/2019: R 1.21; L 0.66 // R SFA 30-49, stable > 50 CIA and EIA stenosis; L > 50 CIA stenosis (likely represents severe stenosis or short segment occlusion)   Peripheral neuropathy    Pneumonia 02/2011   Sacroiliac joint dysfunction of left side 01/03/2020   Stroke ([place]) 04/16/2001   small right cerebellar infarct on 04/16/2001 at that time he was found to have proximal left vertebral artery, proximal left common carotid artery and both  external carotid artery stenosis as well as intracranial stenosis involving mid basilar artery- 07/2011 add questionable TIA   Past Surgical History:  Procedure Laterality Date    NASAL ENDOSCOPY  01/11/2020   chronic rhinitis w/o evidence of acute sinusitis, bilateral inferior turbinate hypertrophy   ABDOMINAL AORTOGRAM W/LOWER EXTREMITY Bilateral 05/30/2019   Procedure: ABDOMINAL AORTOGRAM W/LOWER EXTREMITY;  Surgeon: Wellington Hampshire, MD;  Location: [facility];  Service: Cardiovascular;  Laterality: Bilateral;   ANGIOPLASTY  1989   right coronary artery   ANGIOPLASTY  1992   LAD and OM   ANGIOPLASTY  1998   First DX   BRAIN SURGERY     on prior records   CORONARY ARTERY BYPASS GRAFT  11/12/2009   LIMA to LAD, SVG to OM and SVG to RCA   CORONARY STENT  PLACEMENT  2000   Stent to LAD/Circumflex with angioplasty to first diagonal    LEFT HEART CATH AND CORS/GRAFTS ANGIOGRAPHY N/A 10/13/2017   Procedure: LEFT HEART CATH AND CORS/GRAFTS ANGIOGRAPHY;  Surgeon: Nelva Bush, MD;  Location: [facility];  Service: Cardiovascular;  Laterality: N/A;   Family History  Problem Relation Age of Onset   Depression Mother    Early death Mother    Kidney disease Mother    Ovarian cancer Mother    Hypertension Father    Heart disease Father    Heart attack Father    Asthma Brother    Diabetes Brother    Stroke Other        Uncle   Diabetes Sister    Colon cancer Neg Hx    Rectal cancer Neg Hx    Stomach cancer Neg Hx    Esophageal cancer Neg Hx    Social History   Socioeconomic History   Marital status: Divorced    Spouse name: Not on file   Number of children: 0   Years of education: GED   Highest education level: Not on file  Occupational History   Occupation: retired  Tobacco Use   Smoking status: Former    Packs/day: 0.25    Years: 54.00    Total pack years: 13.50    Types: Cigarettes    Quit date: 10/19/2021    Years since quitting: 0.4   Smokeless  tobacco: Never   Tobacco comments:    Smokes 1-2 cigarettes daily.   Vaping Use   Vaping Use: Never used  Substance and Sexual Activity   Alcohol use: Never   Drug use: No   Sexual activity: Not Currently    Partners: Female  Other Topics Concern   Not on file  Social History Narrative   Marital status/children/pets: divorced.   Education/employment: retired, Patient has his GED. 9th grade education.    Safety:      -smoke alarm in the home:Yes     - wears seatbelt: Yes   Patient is right handed.   Patient does not drink any caffeine.   Lives in a one story home    Social Determinants of Health   Financial Resource Strain: Medium Risk (03/19/2022)   Overall Financial Resource Strain (CARDIA)    Difficulty of Paying Living Expenses: Somewhat hard  Food Insecurity: No Food Insecurity (03/19/2022)   Hunger Vital Sign    Worried About Running Out of Food in the Last Year: Never true    [place] in the Last Year: Never true  Transportation Needs: No Transportation Needs (03/02/2022)   PRAPARE - Hydrologist (Medical): No    Lack of Transportation (Non-Medical): No  Physical Activity: Insufficiently Active (03/19/2022)   Exercise Vital Sign    Days of Exercise per Week: 4 days    Minutes of Exercise per Session: 10 min  Stress: Stress Concern Present (03/19/2022)   [place]    Feeling of Stress : To some extent  Social Connections: Socially Isolated (03/19/2022)   Social Connection and Isolation Panel [NHANES]    Frequency of Communication with Friends and Family: More than three times a week    Frequency of Social Gatherings with Friends and Family: Once a week    Attends Religious Services: Never    Marine scientist or Organizations: No    Attends Archivist Meetings: Never  Marital Status: Divorced    Tobacco Counseling Counseling given: Not  Answered Tobacco comments: Smokes 1-2 cigarettes daily.    Clinical Intake:  Pre-visit preparation completed: No  Pain Score: 7  Pain Location: Generalized     Nutritional Status: BMI 25 -29 Overweight Nutritional Risks: None Diabetes: Yes CBG done?: No Did pt. bring in CBG monitor from home?: No  How often do you need to have someone help you when you read instructions, pamphlets, or other written materials from your doctor or pharmacy?: 1 - Never  Diabetic?yes  Interpreter Needed?: No      Activities of Daily Living    03/19/2022    2:00 PM  In your present state of health, do you have any difficulty performing the following activities:  Hearing? 0  Vision? 1  Difficulty concentrating or making decisions? 0  Walking or climbing stairs? 0  Dressing or bathing? 0  Doing errands, shopping? 0  Preparing Food and eating ? N  Using the Toilet? N  In the past six months, have you accidently leaked urine? N  Do you have problems with loss of bowel control? N  Managing your Medications? N  Managing your Finances? N  Housekeeping or managing your Housekeeping? N    Patient Care Team: Ma Hillock, DO as PCP - General (Family Medicine) Chesley Mires, MD as Consulting Physician (Pulmonary Disease) Calvert Cantor, MD as Consulting Physician (Ophthalmology) [name], Wonda Cheng, MD as Consulting Physician (Cardiology) Milus Banister, MD as Attending Physician (Gastroenterology) Celso Amy, NP as Nurse Practitioner (Nurse Practitioner) Cameron Sprang, MD as Consulting Physician (Neurology) Liana Gerold, MD as Consulting Physician (Nephrology)  Indicate any recent Medical Services you may have received from other than [facility] providers in the past year (date may be approximate).     Assessment:   This is a routine wellness examination for Royal.  Hearing/Vision screen No results found.  Dietary issues and exercise activities discussed: Current Exercise  Habits: The patient does not participate in regular exercise at present   Goals Addressed   None   Depression Screen    03/19/2022    2:00 PM 03/02/2022    1:05 PM 12/09/2021   10:53 AM 10/28/2021    9:13 AM 10/19/2021   10:14 AM 10/19/2021   10:09 AM 09/24/2021    2:18 PM  PHQ 2/9 Scores  PHQ - 2 Score 0 _0 PHQ- 9 Score   _1 Fall Risk    03/19/2022    1:59 PM 12/09/2021   10:25 AM 10/28/2021    9:09 AM 09/24/2021   10:24 AM 09/01/2021    8:46 AM  Fall Risk   Falls in the past year? 1 0 1 0 1  Number falls in past yr: 0 0 1 0 1  Injury with Fall? 0 0 0 0 0  Risk for fall due to : History of fall(s)  Impaired vision    Follow up Falls evaluation completed Falls prevention discussed;Education provided;Falls evaluation completed Falls evaluation completed      FALL RISK PREVENTION PERTAINING TO THE HOME:  Any stairs in or around the home? Yes  If so, are there any without handrails? No  Home free of loose throw rugs in walkways, pet beds, electrical cords, etc? Yes  Adequate lighting in your home to reduce risk of falls? Yes   ASSISTIVE DEVICES UTILIZED TO PREVENT FALLS:  Life alert? No  Use of a cane, walker or w/c? Yes  Grab bars in the bathroom? No  Shower chair or bench in shower? No  Elevated toilet seat or a handicapped toilet? No   TIMED UP AND GO:  Was the test performed? No .  Length of time to ambulate 10 feet: n/a sec.     Cognitive Function:        03/19/2022    2:05 PM  6CIT Screen  What Year? 0 points  What month? 0 points  What time? 0 points  Count back from 20 0 points  Months in reverse 0 points  Repeat phrase 0 points  Total Score 0 points    Immunizations Immunization History  Administered Date(s) Administered   Fluad Quad(high Dose 65+) 02/12/2019, 04/29/2020, 04/22/2021   Influenza Split 05/08/2013, 03/28/2017   Influenza, High Dose Seasonal PF 05/08/2013, 03/28/2017, 05/02/2018   Influenza,inj,Quad PF,6+ Mos  04/21/2016   MODERNA COVID-19 SARS-COV-2 PEDS BIVALENT BOOSTER 6Y-11Y 06/18/2020, 12/10/2020   Moderna Sars-Covid-2 Vaccination 08/27/2019, 10/03/2019   Pneumococcal Conjugate-13 07/13/2016   Pneumococcal Polysaccharide-23 02/21/2018   Tdap 02/15/2013   Zoster, Live 06/22/2015    TDAP status: Up to date  Flu Vaccine status: Due, Education has been provided regarding the importance of this vaccine. Advised may receive this vaccine at local pharmacy or Health Dept. Aware to provide a copy of the vaccination record if obtained from local pharmacy or Health Dept. Verbalized acceptance and understanding.  Pneumococcal vaccine status: Up to date  Covid-19 vaccine status: Completed vaccines  Qualifies for Shingles Vaccine? Yes   Zostavax completed No   Shingrix Completed?: No.    Education has been provided regarding the importance of this vaccine. Patient has been advised to call insurance company to determine out of pocket expense if they have not yet received this vaccine. Advised may also receive vaccine at local pharmacy or Health Dept. Verbalized acceptance and understanding.  Screening Tests Health Maintenance  Topic Date Due   COVID-19 Vaccine (5 - Moderna risk series) 02/04/2021   INFLUENZA VACCINE  09/19/2022 (Originally 01/19/2022)   Zoster Vaccines- Shingrix (1 of 2) 01/29/2024 (Originally 02/23/1965)   HEMOGLOBIN A1C  04/30/2022   OPHTHALMOLOGY EXAM  05/25/2022   FOOT EXAM  10/29/2022   Diabetic kidney evaluation - Urine ACR  02/03/2023   TETANUS/TDAP  02/16/2023   Diabetic kidney evaluation - GFR measurement  03/18/2023   COLONOSCOPY (Pts 45-52yr Insurance coverage will need to be confirmed)  05/14/2023   Pneumonia Vaccine 76 Years old  Completed   HPV VACCINES  Aged Out   Hepatitis C Screening  Discontinued    Health Maintenance  Health Maintenance Due  Topic Date Due   COVID-19 Vaccine (5 - Moderna risk series) 02/04/2021    Colorectal cancer screening: Type of  screening: Colonoscopy. Completed 05/13/2020. Repeat every 3 years  Lung Cancer Screening: (Low Dose CT Chest recommended if Age 77-80years, 30 pack-year currently smoking OR have quit w/in 15years.) does qualify.   Lung Cancer Screening Referral: n/a  Additional Screening:  Hepatitis C Screening: does qualify; Completed n/a  Vision Screening: Recommended annual ophthalmology exams for early detection of glaucoma and other disorders of the eye. Is the patient up to date with their annual eye exam?  No  Who is the provider or what is the name of the office in which the patient attends annual eye exams? Pt calling to get scheduled If pt is not established with a provider, would they like to be referred to  a provider to establish care? No .   Dental Screening: Recommended annual dental exams for proper oral hygiene  Community Resource Referral / Chronic Care Management: CRR required this visit?  No   CCM required this visit?  No      Plan:     I have personally reviewed and noted the following in the patient's chart:   Medical and social history Use of alcohol, tobacco or illicit drugs  Current medications and supplements including opioid prescriptions. Patient is not currently taking opioid prescriptions. Functional ability and status Nutritional status Physical activity Advanced directives List of other physicians Hospitalizations, surgeries, and ER visits in previous 12 months Vitals Screenings to include cognitive, depression, and falls Referrals and appointments  In addition, I have reviewed and discussed with patient certain preventive protocols, quality metrics, and best practice recommendations. A written personalized care plan for preventive services as well as general preventive health recommendations were provided to patient.     Beatrix Fetters, [place]   03/19/2022   Nurse Notes: Non-Face to Face or Face to Face 10 minute visit Encounter    Mr. Peregoy  , Thank you for taking time to come for your Medicare Wellness Visit. I appreciate your ongoing commitment to your health goals. Please review the following plan we discussed and let me know if I can assist you in the future.   These are the goals we discussed:  Goals       Diabetes management (pt-stated)      Care Coordination Interventions: Provided education to patient about basic DM disease process Reviewed medications with patient and discussed importance of medication adherence Counseled on importance of regular laboratory monitoring as prescribed Discussed plans with patient for ongoing care management follow up and provided patient with direct contact information for care management team Reviewed scheduled/upcoming provider appointments including: pending appointments with [facility] schedule on 03/19/2022 along with A1C indicated by pt on this visit. Screening for signs and symptoms of depression related to chronic disease state  Assessed social determinant of health barriers       Patient Stated      Would like to increase walking  Is seeing rehab for feet  Neuropathy        This is a list of the screening recommended for you and due dates:  Health Maintenance  Topic Date Due   COVID-19 Vaccine (5 - Moderna risk series) 02/04/2021   Flu Shot  09/19/2022*   Zoster (Shingles) Vaccine (1 of 2) 01/29/2024*   Hemoglobin A1C  04/30/2022   Eye exam for diabetics  05/25/2022   Complete foot exam   10/29/2022   Yearly kidney health urinalysis for diabetes  02/03/2023   Tetanus Vaccine  02/16/2023   Yearly kidney function blood test for diabetes  03/18/2023   Colon Cancer Screening  05/14/2023   Pneumonia Vaccine  Completed   HPV Vaccine  Aged Out   Hepatitis C Screening: USPSTF Recommendation to screen - Ages 21-79 yo.  Discontinued  *Topic was postponed. The date shown is not the original due date.

## 2022-03-19 NOTE — Patient Instructions (Signed)
Health Maintenance, Male Adopting a healthy lifestyle and getting preventive care are important in promoting health and wellness. Ask your health care provider about: The right schedule for you to have regular tests and exams. Things you can do on your own to prevent diseases and keep yourself healthy. What should I know about diet, weight, and exercise? Eat a healthy diet  Eat a diet that includes plenty of vegetables, fruits, low-fat dairy products, and lean protein. Do not eat a lot of foods that are high in solid fats, added sugars, or sodium. Maintain a healthy weight Body mass index (BMI) is a measurement that can be used to identify possible weight problems. It estimates body fat based on height and weight. Your health care provider can help determine your BMI and help you achieve or maintain a healthy weight. Get regular exercise Get regular exercise. This is one of the most important things you can do for your health. Most adults should: Exercise for at least 150 minutes each week. The exercise should increase your heart rate and make you sweat (moderate-intensity exercise). Do strengthening exercises at least twice a week. This is in addition to the moderate-intensity exercise. Spend less time sitting. Even light physical activity can be beneficial. Watch cholesterol and blood lipids Have your blood tested for lipids and cholesterol at 77 years of age, then have this test every 5 years. You may need to have your cholesterol levels checked more often if: Your lipid or cholesterol levels are high. You are older than 77 years of age. You are at high risk for heart disease. What should I know about cancer screening? Many types of cancers can be detected early and may often be prevented. Depending on your health history and family history, you may need to have cancer screening at various ages. This may include screening for: Colorectal cancer. Prostate cancer. Skin cancer. Lung  cancer. What should I know about heart disease, diabetes, and high blood pressure? Blood pressure and heart disease High blood pressure causes heart disease and increases the risk of stroke. This is more likely to develop in people who have high blood pressure readings or are overweight. Talk with your health care provider about your target blood pressure readings. Have your blood pressure checked: Every 3-5 years if you are 18-39 years of age. Every year if you are 40 years old or older. If you are between the ages of 65 and 75 and are a current or former smoker, ask your health care provider if you should have a one-time screening for abdominal aortic aneurysm (AAA). Diabetes Have regular diabetes screenings. This checks your fasting blood sugar level. Have the screening done: Once every three years after age 45 if you are at a normal weight and have a low risk for diabetes. More often and at a younger age if you are overweight or have a high risk for diabetes. What should I know about preventing infection? Hepatitis B If you have a higher risk for hepatitis B, you should be screened for this virus. Talk with your health care provider to find out if you are at risk for hepatitis B infection. Hepatitis C Blood testing is recommended for: Everyone born from 1945 through 1965. Anyone with known risk factors for hepatitis C. Sexually transmitted infections (STIs) You should be screened each year for STIs, including gonorrhea and chlamydia, if: You are sexually active and are younger than 77 years of age. You are older than 77 years of age and your   health care provider tells you that you are at risk for this type of infection. Your sexual activity has changed since you were last screened, and you are at increased risk for chlamydia or gonorrhea. Ask your health care provider if you are at risk. Ask your health care provider about whether you are at high risk for HIV. Your health care provider  may recommend a prescription medicine to help prevent HIV infection. If you choose to take medicine to prevent HIV, you should first get tested for HIV. You should then be tested every 3 months for as long as you are taking the medicine. Follow these instructions at home: Alcohol use Do not drink alcohol if your health care provider tells you not to drink. If you drink alcohol: Limit how much you have to 0-2 drinks a day. Know how much alcohol is in your drink. In the [place] one drink equals one 12 oz bottle of beer (355 mL), one 5 oz glass of wine (148 mL), or one 1 oz glass of hard liquor (44 mL). Lifestyle Do not use any products that contain nicotine or tobacco. These products include cigarettes, chewing tobacco, and vaping devices, such as e-cigarettes. If you need help quitting, ask your health care provider. Do not use street drugs. Do not share needles. Ask your health care provider for help if you need support or information about quitting drugs. General instructions Schedule regular health, dental, and eye exams. Stay current with your vaccines. Tell your health care provider if: You often feel depressed. You have ever been abused or do not feel safe at home. Summary Adopting a healthy lifestyle and getting preventive care are important in promoting health and wellness. Follow your health care provider's instructions about healthy diet, exercising, and getting tested or screened for diseases. Follow your health care provider's instructions on monitoring your cholesterol and blood pressure. This information is not intended to replace advice given to you by your health care provider. Make sure you discuss any questions you have with your health care provider. Document Revised: 10/27/2020 Document Reviewed: 10/27/2020 Elsevier Patient Education  2023 Elsevier Inc.  

## 2022-03-20 ENCOUNTER — Other Ambulatory Visit: Payer: Self-pay

## 2022-03-25 ENCOUNTER — Other Ambulatory Visit: Payer: Self-pay

## 2022-03-25 ENCOUNTER — Inpatient Hospital Stay: Payer: Medicare Other

## 2022-03-25 ENCOUNTER — Inpatient Hospital Stay: Payer: Medicare Other | Attending: Family

## 2022-03-25 VITALS — BP 135/84 | HR 57 | Temp 97.7°F | Resp 18

## 2022-03-25 DIAGNOSIS — C9000 Multiple myeloma not having achieved remission: Secondary | ICD-10-CM | POA: Diagnosis not present

## 2022-03-25 DIAGNOSIS — Z79899 Other long term (current) drug therapy: Secondary | ICD-10-CM | POA: Insufficient documentation

## 2022-03-25 DIAGNOSIS — Z5112 Encounter for antineoplastic immunotherapy: Secondary | ICD-10-CM | POA: Diagnosis not present

## 2022-03-25 LAB — CMP (CANCER CENTER ONLY)
ALT: 27 U/L (ref 0–44)
AST: 20 U/L (ref 15–41)
Albumin: 4.2 g/dL (ref 3.5–5.0)
Alkaline Phosphatase: 45 U/L (ref 38–126)
Anion gap: 6 (ref 5–15)
BUN: 22 mg/dL (ref 8–23)
CO2: 28 mmol/L (ref 22–32)
Calcium: 9.6 mg/dL (ref 8.9–10.3)
Chloride: 104 mmol/L (ref 98–111)
Creatinine: 1.52 mg/dL — ABNORMAL HIGH (ref 0.61–1.24)
GFR, Estimated: 47 mL/min — ABNORMAL LOW (ref >60)
Glucose, Bld: 126 mg/dL — ABNORMAL HIGH (ref 70–99)
Potassium: 4.3 mmol/L (ref 3.5–5.1)
Sodium: 138 mmol/L (ref 135–145)
Total Bilirubin: 0.5 mg/dL (ref 0.3–1.2)
Total Protein: 6.3 g/dL — ABNORMAL LOW (ref 6.5–8.1)

## 2022-03-25 LAB — CBC WITH DIFFERENTIAL (CANCER CENTER ONLY)
Abs Immature Granulocytes: 0.01 10*3/uL (ref 0.00–0.07)
Basophils Absolute: 0.1 10*3/uL (ref 0.0–0.1)
Basophils Relative: 2 %
Eosinophils Absolute: 0.2 10*3/uL (ref 0.0–0.5)
Eosinophils Relative: 4 %
HCT: 36.2 % — ABNORMAL LOW (ref 39.0–52.0)
Hemoglobin: 12.2 g/dL — ABNORMAL LOW (ref 13.0–17.0)
Immature Granulocytes: 0 %
Lymphocytes Relative: 31 %
Lymphs Abs: 1.1 10*3/uL (ref 0.7–4.0)
MCH: 30.5 pg (ref 26.0–34.0)
MCHC: 33.7 g/dL (ref 30.0–36.0)
MCV: 90.5 fL (ref 80.0–100.0)
Monocytes Absolute: 0.8 10*3/uL (ref 0.1–1.0)
Monocytes Relative: 21 %
Neutro Abs: 1.5 10*3/uL — ABNORMAL LOW (ref 1.7–7.7)
Neutrophils Relative %: 42 %
Platelet Count: 133 10*3/uL — ABNORMAL LOW (ref 150–400)
RBC: 4 MIL/uL — ABNORMAL LOW (ref 4.22–5.81)
RDW: 15.7 % — ABNORMAL HIGH (ref 11.5–15.5)
WBC Count: 3.7 10*3/uL — ABNORMAL LOW (ref 4.0–10.5)
nRBC: 0 % (ref 0.0–0.2)

## 2022-03-25 MED ORDER — ONDANSETRON HCL 8 MG PO TABS: 8.0000 mg | ORAL_TABLET | Freq: Three times a day (TID) | ORAL | 1 refills | Status: DC | PRN

## 2022-03-25 MED ORDER — LORAZEPAM 1 MG PO TABS
0.5000 mg | ORAL_TABLET | Freq: Once | ORAL | Status: AC
  Administered 2022-03-25: 0.5 mg via ORAL
  Filled 2022-03-25: qty 1

## 2022-03-25 MED ORDER — DEXAMETHASONE 4 MG PO TABS
4.0000 mg | ORAL_TABLET | Freq: Once | ORAL | Status: AC
  Administered 2022-03-25: 4 mg via ORAL
  Filled 2022-03-25: qty 1

## 2022-03-25 MED ORDER — BORTEZOMIB CHEMO SQ INJECTION 3.5 MG (2.5MG/ML)
1.3000 mg/m2 | Freq: Once | INTRAMUSCULAR | Status: AC
  Administered 2022-03-25: 2.5 mg via SUBCUTANEOUS
  Filled 2022-03-25: qty 1

## 2022-03-25 NOTE — Progress Notes (Signed)
Per Dr. Ennever, okay to treat today despite labs ?

## 2022-03-25 NOTE — Patient Instructions (Signed)
[facility]  Discharge Instructions: Thank you for choosing [facility] to provide your oncology and hematology care.   If you have a lab appointment with the Cancer Center, please go directly to the Cancer Center and check in at the registration area.  Wear comfortable clothing and clothing appropriate for easy access to any Portacath or PICC line.   We strive to give you quality time with your provider. You may need to reschedule your appointment if you arrive late (15 or more minutes).  Arriving late affects you and other patients whose appointments are after yours.  Also, if you miss three or more appointments without notifying the office, you may be dismissed from the clinic at the provider's discretion.      For prescription refill requests, have your pharmacy contact our office and allow 72 hours for refills to be completed.    Today you received the following chemotherapy and/or immunotherapy agents velcade    To help prevent nausea and vomiting after your treatment, we encourage you to take your nausea medication as directed.  BELOW ARE SYMPTOMS THAT SHOULD BE REPORTED IMMEDIATELY: *FEVER GREATER THAN 100.4 F (38 C) OR HIGHER *CHILLS OR SWEATING *NAUSEA AND VOMITING THAT IS NOT CONTROLLED WITH YOUR NAUSEA MEDICATION *UNUSUAL SHORTNESS OF BREATH *UNUSUAL BRUISING OR BLEEDING *URINARY PROBLEMS (pain or burning when urinating, or frequent urination) *BOWEL PROBLEMS (unusual diarrhea, constipation, pain near the anus) TENDERNESS IN MOUTH AND THROAT WITH OR WITHOUT PRESENCE OF ULCERS (sore throat, sores in mouth, or a toothache) UNUSUAL RASH, SWELLING OR PAIN  UNUSUAL VAGINAL DISCHARGE OR ITCHING   Items with * indicate a potential emergency and should be followed up as soon as possible or go to the Emergency Department if any problems should occur.  Please show the CHEMOTHERAPY ALERT CARD or IMMUNOTHERAPY ALERT CARD at check-in to the  Emergency Department and triage nurse. Should you have questions after your visit or need to cancel or reschedule your appointment, please contact [facility]  [phone] and follow the prompts.  Office hours are 8:00 a.m. to 4:30 p.m. Monday - Friday. Please note that voicemails left after 4:00 p.m. may not be returned until the following business day.  We are closed weekends and major holidays. You have access to a nurse at all times for urgent questions. Please call the main number to the clinic [phone] and follow the prompts.  For any non-urgent questions, you may also contact your provider using MyChart. We now offer e-Visits for anyone 18 and older to request care online for non-urgent symptoms. For details visit [email or web address].   Also download the MyChart app! Go to the app store, search "MyChart", open the app, select [facility], and log in with your MyChart username and password.  Masks are optional in the cancer centers. If you would like for your care team to wear a mask while they are taking care of you, please let them know. You may have one support person who is at least 77 years old accompany you for your appointments. 

## 2022-03-31 ENCOUNTER — Ambulatory Visit: Payer: Self-pay | Admitting: *Deleted

## 2022-03-31 ENCOUNTER — Encounter: Payer: Self-pay | Admitting: *Deleted

## 2022-03-31 NOTE — Patient Instructions (Signed)
Visit Information  Thank you for taking time to visit with me today. Please don't hesitate to contact me if I can be of assistance to you.   Following are the goals we discussed today:   Goals Addressed               This Visit's Progress     COMPLETED: Diabetes management (pt-stated)        Update: Pt reports he is managing his diabetes reporting AM reads 70-85 however in receiving his pre-medication for his chemotherapy pt aware reading will fluctuate as high as 400. Pt states over 1-2 days it will stabilize back to his baseline reads 80-100 with no added medications.  Pt confirmed he completed his AWV on 03/19/2022 with additional issues at this time. Pt confirms ongoing sufficient transportation to all his pending medical appointments and a good support system if needed. Encouraged pt to contact his providers office if he needs consultation for counseling and/or a nutritionist in regulating his ongoing blood glucose levels however aware on how to reach this RN case manager for any ongoing needs for managing any medical issues related to his diabetes. Will continue to encourage exercises as tolerated and his process with managing his diabetes.  Medications reviewed along with some SDOH Pt feels he can continue managing his diabetes at this time with no additional needs via this case Freight forwarder.         Please call the care guide team at [phone] if you need to cancel or reschedule your appointment.   If you are experiencing a Mental Health or [place] or need someone to talk to, please call the Suicide and Crisis Lifeline: 988  Patient verbalizes understanding of instructions and care plan provided today and agrees to view in [facility]. Active MyChart status and patient understanding of how to access instructions and care plan via MyChart confirmed with patient.     No further follow up required: No additional needs at this time.   Raina Mina, RN Care  Management Coordinator [facility] Office [phone]

## 2022-03-31 NOTE — Patient Outreach (Signed)
  Care Coordination   Initial Visit Note   03/31/2022 Name: Marvin Salinas MRN: [id number] DOB: Sep 26, 1945  Marvin Salinas is a 76 y.o. year old male who sees Kuneff, Renee A, DO for primary care. I spoke with  Marvin Salinas by phone today.  What matters to the patients health and wellness today?  No additional needs    Goals Addressed               This Visit's Progress     COMPLETED: Diabetes management (pt-stated)        Update: Pt reports he is managing his diabetes reporting AM reads 70-85 however in receiving his pre-medication for his chemotherapy pt aware reading will fluctuate as high as 400. Pt states over 1-2 days it will stabilize back to his baseline reads 80-100 with no added medications.  Pt confirmed he completed his AWV on 03/19/2022 with additional issues at this time. Pt confirms ongoing sufficient transportation to all his pending medical appointments and a good support system if needed. Encouraged pt to contact his providers office if he needs consultation for counseling and/or a nutritionist in regulating his ongoing blood glucose levels however aware on how to reach this RN case manager for any ongoing needs for managing any medical issues related to his diabetes. Will continue to encourage exercises as tolerated and his process with managing his diabetes.  Medications reviewed along with some SDOH Pt feels he can continue managing his diabetes at this time with no additional needs via this case Freight forwarder.         SDOH assessments and interventions completed:  Yes  SDOH Interventions Today    Flowsheet Row Most Recent Value  SDOH Interventions   Transportation Interventions Intervention Not Indicated        Care Coordination Interventions Activated:  Yes  Care Coordination Interventions:  Yes, provided   Follow up plan: No further intervention required.   Encounter Outcome:  Pt. Visit Completed   Marvin Mina, RN Care Management  Coordinator [facility] Office [phone]

## 2022-04-09 ENCOUNTER — Other Ambulatory Visit: Payer: Self-pay | Admitting: Hematology & Oncology

## 2022-04-09 DIAGNOSIS — C9000 Multiple myeloma not having achieved remission: Secondary | ICD-10-CM

## 2022-04-15 ENCOUNTER — Inpatient Hospital Stay: Payer: Medicare Other

## 2022-04-15 ENCOUNTER — Inpatient Hospital Stay (HOSPITAL_BASED_OUTPATIENT_CLINIC_OR_DEPARTMENT_OTHER): Payer: Medicare Other | Admitting: Oncology

## 2022-04-15 ENCOUNTER — Other Ambulatory Visit: Payer: Self-pay | Admitting: *Deleted

## 2022-04-15 ENCOUNTER — Other Ambulatory Visit: Payer: Self-pay

## 2022-04-15 VITALS — BP 149/61 | HR 51

## 2022-04-15 DIAGNOSIS — C9000 Multiple myeloma not having achieved remission: Secondary | ICD-10-CM

## 2022-04-15 DIAGNOSIS — Z79899 Other long term (current) drug therapy: Secondary | ICD-10-CM | POA: Diagnosis not present

## 2022-04-15 DIAGNOSIS — Z5112 Encounter for antineoplastic immunotherapy: Secondary | ICD-10-CM | POA: Diagnosis not present

## 2022-04-15 LAB — CMP (CANCER CENTER ONLY)
ALT: 15 U/L (ref 0–44)
AST: 14 U/L — ABNORMAL LOW (ref 15–41)
Albumin: 4.2 g/dL (ref 3.5–5.0)
Alkaline Phosphatase: 43 U/L (ref 38–126)
Anion gap: 7 (ref 5–15)
BUN: 15 mg/dL (ref 8–23)
CO2: 29 mmol/L (ref 22–32)
Calcium: 9.2 mg/dL (ref 8.9–10.3)
Chloride: 104 mmol/L (ref 98–111)
Creatinine: 1.57 mg/dL — ABNORMAL HIGH (ref 0.61–1.24)
GFR, Estimated: 45 mL/min — ABNORMAL LOW (ref >60)
Glucose, Bld: 172 mg/dL — ABNORMAL HIGH (ref 70–99)
Potassium: 3.9 mmol/L (ref 3.5–5.1)
Sodium: 140 mmol/L (ref 135–145)
Total Bilirubin: 0.7 mg/dL (ref 0.3–1.2)
Total Protein: 5.9 g/dL — ABNORMAL LOW (ref 6.5–8.1)

## 2022-04-15 LAB — CBC WITH DIFFERENTIAL (CANCER CENTER ONLY)
Abs Immature Granulocytes: 0 10*3/uL (ref 0.00–0.07)
Basophils Absolute: 0.1 10*3/uL (ref 0.0–0.1)
Basophils Relative: 3 %
Eosinophils Absolute: 0.3 10*3/uL (ref 0.0–0.5)
Eosinophils Relative: 11 %
HCT: 35.9 % — ABNORMAL LOW (ref 39.0–52.0)
Hemoglobin: 12 g/dL — ABNORMAL LOW (ref 13.0–17.0)
Immature Granulocytes: 0 %
Lymphocytes Relative: 28 %
Lymphs Abs: 0.8 10*3/uL (ref 0.7–4.0)
MCH: 30.5 pg (ref 26.0–34.0)
MCHC: 33.4 g/dL (ref 30.0–36.0)
MCV: 91.3 fL (ref 80.0–100.0)
Monocytes Absolute: 0.5 10*3/uL (ref 0.1–1.0)
Monocytes Relative: 16 %
Neutro Abs: 1.2 10*3/uL — ABNORMAL LOW (ref 1.7–7.7)
Neutrophils Relative %: 42 %
Platelet Count: 117 10*3/uL — ABNORMAL LOW (ref 150–400)
RBC: 3.93 MIL/uL — ABNORMAL LOW (ref 4.22–5.81)
RDW: 15.3 % (ref 11.5–15.5)
WBC Count: 2.9 10*3/uL — ABNORMAL LOW (ref 4.0–10.5)
nRBC: 0 % (ref 0.0–0.2)

## 2022-04-15 LAB — LACTATE DEHYDROGENASE: LDH: 179 U/L (ref 98–192)

## 2022-04-15 MED ORDER — NYSTATIN 100000 UNIT/GM EX CREA: 1.0000 | TOPICAL_CREAM | Freq: Two times a day (BID) | CUTANEOUS | 1 refills | Status: DC

## 2022-04-15 MED ORDER — DEXAMETHASONE 4 MG PO TABS
20.0000 mg | ORAL_TABLET | Freq: Once | ORAL | Status: AC
  Administered 2022-04-15: 20 mg via ORAL
  Filled 2022-04-15: qty 5

## 2022-04-15 MED ORDER — ACETAMINOPHEN 325 MG PO TABS
650.0000 mg | ORAL_TABLET | Freq: Once | ORAL | Status: AC
  Administered 2022-04-15: 650 mg via ORAL
  Filled 2022-04-15: qty 2

## 2022-04-15 MED ORDER — BORTEZOMIB CHEMO SQ INJECTION 3.5 MG (2.5MG/ML)
1.3000 mg/m2 | Freq: Once | INTRAMUSCULAR | Status: AC
  Administered 2022-04-15: 2.5 mg via SUBCUTANEOUS
  Filled 2022-04-15: qty 1

## 2022-04-15 MED ORDER — LORAZEPAM 1 MG PO TABS
0.5000 mg | ORAL_TABLET | Freq: Once | ORAL | Status: AC
  Administered 2022-04-15: 0.5 mg via ORAL
  Filled 2022-04-15: qty 1

## 2022-04-15 MED ORDER — DARATUMUMAB-HYALURONIDASE-FIHJ 1800-30000 MG-UT/15ML ~~LOC~~ SOLN
1800.0000 mg | Freq: Once | SUBCUTANEOUS | Status: AC
  Administered 2022-04-15: 1800 mg via SUBCUTANEOUS
  Filled 2022-04-15: qty 15

## 2022-04-15 MED ORDER — LENALIDOMIDE 10 MG PO CAPS: 10.0000 mg | ORAL_CAPSULE | Freq: Every day | ORAL | 0 refills | Status: DC

## 2022-04-15 MED ORDER — DIPHENHYDRAMINE HCL 25 MG PO CAPS
50.0000 mg | ORAL_CAPSULE | Freq: Once | ORAL | Status: AC
  Administered 2022-04-15: 50 mg via ORAL
  Filled 2022-04-15: qty 2

## 2022-04-15 NOTE — Patient Instructions (Addendum)
[facility]  Discharge Instructions: Thank you for choosing [facility] to provide your oncology and hematology care.   If you have a lab appointment with the [facility], please go directly to the [facility] and check in at the registration area.  Wear comfortable clothing and clothing appropriate for easy access to any Portacath or PICC line.   We strive to give you quality time with your provider. You may need to reschedule your appointment if you arrive late (15 or more minutes).  Arriving late affects you and other patients whose appointments are after yours.  Also, if you miss three or more appointments without notifying the office, you may be dismissed from the clinic at the provider's discretion.      For prescription refill requests, have your pharmacy contact our office and allow 72 hours for refills to be completed.    Today you received the following chemotherapy and/or immunotherapy agents Velcade and Daratumamab   To help prevent nausea and vomiting after your treatment, we encourage you to take your nausea medication as directed.  BELOW ARE SYMPTOMS THAT SHOULD BE REPORTED IMMEDIATELY: *FEVER GREATER THAN 100.4 F (38 C) OR HIGHER *CHILLS OR SWEATING *NAUSEA AND VOMITING THAT IS NOT CONTROLLED WITH YOUR NAUSEA MEDICATION *UNUSUAL SHORTNESS OF BREATH *UNUSUAL BRUISING OR BLEEDING *URINARY PROBLEMS (pain or burning when urinating, or frequent urination) *BOWEL PROBLEMS (unusual diarrhea, constipation, pain near the anus) TENDERNESS IN MOUTH AND THROAT WITH OR WITHOUT PRESENCE OF ULCERS (sore throat, sores in mouth, or a toothache) UNUSUAL RASH, SWELLING OR PAIN  UNUSUAL VAGINAL DISCHARGE OR ITCHING   Items with * indicate a potential emergency and should be followed up as soon as possible or go to the Emergency Department if any problems should occur.  Please show the CHEMOTHERAPY ALERT CARD or IMMUNOTHERAPY ALERT CARD at  check-in to the Emergency Department and triage nurse. Should you have questions after your visit or need to cancel or reschedule your appointment, please contact [facility]  [phone] and follow the prompts.  Office hours are 8:00 a.m. to 4:30 p.m. Monday - Friday. Please note that voicemails left after 4:00 p.m. may not be returned until the following business day.  We are closed weekends and major holidays. You have access to a nurse at all times for urgent questions. Please call the main number to the clinic [phone] and follow the prompts.  For any non-urgent questions, you may also contact your provider using MyChart. We now offer e-Visits for anyone 77 and older to request care online for non-urgent symptoms. For details visit [email or web address].   Also download the MyChart app! Go to the app store, search "MyChart", open the app, select [facility], and log in with your MyChart username and password.  Masks are optional in the cancer centers. If you would like for your care team to wear a mask while they are taking care of you, please let them know. You may have one support person who is at least 77 years old accompany you for your appointments.[facility]  Discharge Instructions: Thank you for choosing [facility] to provide your oncology and hematology care.   If you have a lab appointment with the [facility], please go directly to the [facility] and check in at the registration area.  Wear comfortable clothing and clothing appropriate for easy access to any Portacath or PICC line.  We strive to give you quality time with your provider. You may need to reschedule your appointment if you arrive late (15 or more minutes).  Arriving late affects you and other patients whose appointments are after yours.  Also, if you miss three or more appointments without notifying the office, you may be dismissed from the  clinic at the provider's discretion.      For prescription refill requests, have your pharmacy contact our office and allow 72 hours for refills to be completed.    Today you received the following chemotherapy and/or immunotherapy agents faspro, velcade     To help prevent nausea and vomiting after your treatment, we encourage you to take your nausea medication as directed.  BELOW ARE SYMPTOMS THAT SHOULD BE REPORTED IMMEDIATELY: *FEVER GREATER THAN 100.4 F (38 C) OR HIGHER *CHILLS OR SWEATING *NAUSEA AND VOMITING THAT IS NOT CONTROLLED WITH YOUR NAUSEA MEDICATION *UNUSUAL SHORTNESS OF BREATH *UNUSUAL BRUISING OR BLEEDING *URINARY PROBLEMS (pain or burning when urinating, or frequent urination) *BOWEL PROBLEMS (unusual diarrhea, constipation, pain near the anus) TENDERNESS IN MOUTH AND THROAT WITH OR WITHOUT PRESENCE OF ULCERS (sore throat, sores in mouth, or a toothache) UNUSUAL RASH, SWELLING OR PAIN  UNUSUAL VAGINAL DISCHARGE OR ITCHING   Items with * indicate a potential emergency and should be followed up as soon as possible or go to the Emergency Department if any problems should occur.  Please show the CHEMOTHERAPY ALERT CARD or IMMUNOTHERAPY ALERT CARD at check-in to the Emergency Department and triage nurse. Should you have questions after your visit or need to cancel or reschedule your appointment, please contact [facility]  [phone] and follow the prompts.  Office hours are 8:00 a.m. to 4:30 p.m. Monday - Friday. Please note that voicemails left after 4:00 p.m. may not be returned until the following business day.  We are closed weekends and major holidays. You have access to a nurse at all times for urgent questions. Please call the main number to the clinic [phone] and follow the prompts.  For any non-urgent questions, you may also contact your provider using MyChart. We now offer e-Visits for anyone 78 and older to request care online for  non-urgent symptoms. For details visit [email or web address].   Also download the MyChart app! Go to the app store, search "MyChart", open the app, select [facility], and log in with your MyChart username and password.  Masks are optional in the cancer centers. If you would like for your care team to wear a mask while they are taking care of you, please let them know. You may have one support person who is at least 77 years old accompany you for your appointments.

## 2022-04-15 NOTE — Progress Notes (Signed)
Reviewed labs with Mikey Bussing, NP and pt ok to treat with ANC 1.2

## 2022-04-15 NOTE — Addendum Note (Signed)
Addended by: Maryanna Shape on: 04/15/2022 11:44 AM   Modules accepted: Level of Service

## 2022-04-15 NOTE — Progress Notes (Addendum)
Hematology and Oncology Follow Up Visit  Marvin Salinas [id number] 03-02-1946 76 y.o. 04/15/2022   Principle Diagnosis:  Lambda light chain myeloma --normal cytogenetics  Current Therapy:   Faspro/Velcade/Revlimid -- s/p cycle #5 -- start on 11/05/2021     Interim History:  Marvin Salinas is back for follow-up.  So far, he is doing pretty well.  I think he is tolerated treatment nicely.  His blood sugars run high at times  He has had a nice response to treatment.  His last lambda light chain was 93 mg/L.  Today's level is pending.   Reports rash to left groin.   He has had no problems with diarrhea.  He has had some constipation.  There has been no cough or shortness of breath.  He has had no fever.  There has been no leg swelling.  He has had no problems bleeding.  He is on Eliquis because of atrial fibrillation.  His appetite is doing nicely.  Again, there is been no nausea or vomiting.  Overall, I would say his performance status is probably ECOG 1.    Medications:  Current Outpatient Medications:    BD INSULIN SYRINGE U/F 31G X 5/16" 0.5 ML MISC, MONITOR BLOOD GLUCOSE 3 TIMES DAILY, Disp: 100 each, Rfl: 4   blood glucose meter kit and supplies KIT, Dispense based on patient and insurance preference. Use up to four times daily as directed., Disp: 1 each, Rfl: 0   Carboxymethylcell-Hypromellose (GENTEAL OP), Place 1 drop into both eyes daily as needed (dry eyes)., Disp: , Rfl:    dofetilide (TIKOSYN) 125 MCG capsule, TAKE 1 CAPSULE BY MOUTH TWICE A DAY, Disp: 180 capsule, Rfl: 3   ELIQUIS 5 MG TABS tablet, TAKE 1 TABLET BY MOUTH TWICE A DAY, Disp: 180 tablet, Rfl: 1   ezetimibe (ZETIA) 10 MG tablet, Take 1 tablet (10 mg total) by mouth daily., Disp: 90 tablet, Rfl: 3   famciclovir (FAMVIR) 250 MG tablet, Take 1 tablet (250 mg total) by mouth daily., Disp: 30 tablet, Rfl: 12   fluticasone (FLONASE) 50 MCG/ACT nasal spray, Place 2 sprays into both nostrils daily., Disp: , Rfl:     insulin glargine (LANTUS) 100 UNIT/ML injection, 30 units subcu injection in the morning and 35 units subcu injection before bed., Disp: 30 mL, Rfl: 5   Lancets (ONETOUCH DELICA PLUS [id number]) MISC, USE UP TO FOUR TIMES DAILY AS DIRECTED **EMERGENCY FILL**, Disp: 100 each, Rfl: 11   lenalidomide (REVLIMID) 10 MG capsule, Take 1 capsule (10 mg total) by mouth daily. [employer] Auth # [id number], Disp: 21 capsule, Rfl: 0   loratadine (CLARITIN) 10 MG tablet, Take 10 mg by mouth daily as needed for allergies., Disp: , Rfl:    methocarbamol (ROBAXIN) 500 MG tablet, Take 500 mg by mouth daily., Disp: , Rfl:    metoprolol tartrate (LOPRESSOR) 25 MG tablet, Take 0.5 tablets (12.5 mg total) by mouth 2 (two) times daily., Disp: 90 tablet, Rfl: 3   nitroGLYCERIN (NITROSTAT) 0.4 MG SL tablet, PLACE 1 TABLET (0.4 MG TOTAL) UNDER THE TONGUE EVERY 5 (FIVE) MINUTES AS NEEDED FOR CHEST PAIN., Disp: 25 tablet, Rfl: 12   nitroGLYCERIN (NITROSTAT) 0.4 MG SL tablet, Place 1 tablet under the tongue every 5 (five) minutes as needed., Disp: , Rfl:    olmesartan (BENICAR) 40 MG tablet, Take 20 mg by mouth 2 (two) times daily., Disp: , Rfl:    ondansetron (ZOFRAN) 8 MG tablet, Take 1 tablet (8 mg total) by mouth every  8 (eight) hours as needed for nausea or vomiting., Disp: 30 tablet, Rfl: 1   ONETOUCH VERIO test strip, USE UP TO FOUR TIMES DAILY AS DIRECTED., Disp: 100 strip, Rfl: 3   polyethylene glycol powder (GLYCOLAX/MIRALAX) 17 GM/SCOOP powder, Take by mouth., Disp: , Rfl:    rosuvastatin (CRESTOR) 20 MG tablet, Take 1 tablet (20 mg total) by mouth at bedtime., Disp: 90 tablet, Rfl: 3   Semaglutide (RYBELSUS) 7 MG TABS, Take 1 tablet by mouth daily with breakfast., Disp: 90 tablet, Rfl: 1  Allergies:  Allergies  Allergen Reactions   Codeine Nausea And Vomiting   Lisinopril Cough   Nsaids Other (See Comments)    CKD   Dapagliflozin Rash   Latex Hives    Past Medical History, Surgical history, Social history, and  Family History were reviewed and updated.  Review of Systems: Review of Systems  Constitutional:  Negative for fatigue.  HENT:  Negative.    Eyes: Negative.   Respiratory: Negative.    Cardiovascular: Negative.   Gastrointestinal: Negative.   Endocrine: Negative.   Genitourinary: Negative.    Musculoskeletal: Negative.   Skin:  Positive for rash.  Neurological: Negative.   Hematological: Negative.   Psychiatric/Behavioral: Negative.      Physical Exam:  weight is 77.3 kg. His oral temperature is 97.8 F (36.6 C). His blood pressure is 144/51 (abnormal) and his pulse is 54 (abnormal). His respiration is 18 and oxygen saturation is 100%.   Wt Readings from Last 3 Encounters:  04/15/22 77.3 kg  03/17/22 74.8 kg  02/17/22 75.8 kg    Physical Exam Vitals reviewed.  HENT:     Head: Normocephalic and atraumatic.  Eyes:     Pupils: Pupils are equal, round, and reactive to light.  Cardiovascular:     Rate and Rhythm: Normal rate and regular rhythm.     Heart sounds: Normal heart sounds.  Pulmonary:     Effort: Pulmonary effort is normal.     Breath sounds: Normal breath sounds.  Abdominal:     General: Bowel sounds are normal.     Palpations: Abdomen is soft.  Musculoskeletal:        General: No tenderness or deformity. Normal range of motion.     Cervical back: Normal range of motion.  Lymphadenopathy:     Cervical: No cervical adenopathy.  Skin:    General: Skin is warm and dry.     Findings: No erythema or rash.     Comments: Rash left groin  Neurological:     Mental Status: He is alert and oriented to person, place, and time.  Psychiatric:        Behavior: Behavior normal.        Thought Content: Thought content normal.        Judgment: Judgment normal.     Lab Results  Component Value Date   WBC 2.9 (L) 04/15/2022   HGB 12.0 (L) 04/15/2022   HCT 35.9 (L) 04/15/2022   MCV 91.3 04/15/2022   PLT 117 (L) 04/15/2022     Chemistry      Component Value  Date/Time   NA 138 03/25/2022 0758   NA 140 08/31/2021 1026   K 4.3 03/25/2022 0758   CL 104 03/25/2022 0758   CO2 28 03/25/2022 0758   BUN 22 03/25/2022 0758   BUN 21 08/31/2021 1026   CREATININE 1.52 (H) 03/25/2022 0758      Component Value Date/Time   CALCIUM 9.6 03/25/2022 0758  ALKPHOS 45 03/25/2022 0758   AST 20 03/25/2022 0758   ALT 27 03/25/2022 0758   BILITOT 0.5 03/25/2022 0758      Impression and Plan: Mr. Graumann is a very nice 76 year old white male.  He has lambda light chain myeloma.  He has mild renal insufficiency.    We will continue to follow his lambda light chains.  We will proceed with his fifth cycle of treatment today.  I do not see that we have to do a bone marrow on him.  We will follow his light chains along.  Prescription sent for nystatin cream to his rash in the groin.   We will plan to get him back in 4 weeks for follow-up.    Mikey Bussing, NP 10/26/20238:16 AM

## 2022-04-16 LAB — KAPPA/LAMBDA LIGHT CHAINS
Kappa free light chain: 28.7 mg/L — ABNORMAL HIGH (ref 3.3–19.4)
Kappa, lambda light chain ratio: 0.58 (ref 0.26–1.65)
Lambda free light chains: 49.7 mg/L — ABNORMAL HIGH (ref 5.7–26.3)

## 2022-04-18 LAB — IGG, IGA, IGM
IgA: 51 mg/dL — ABNORMAL LOW (ref 61–437)
IgG (Immunoglobin G), Serum: 332 mg/dL — ABNORMAL LOW (ref 603–1613)
IgM (Immunoglobulin M), Srm: 11 mg/dL — ABNORMAL LOW (ref 15–143)

## 2022-04-19 LAB — PROTEIN ELECTROPHORESIS, SERUM, WITH REFLEX
A/G Ratio: 1.8 — ABNORMAL HIGH (ref 0.7–1.7)
Albumin ELP: 3.6 g/dL (ref 2.9–4.4)
Alpha-1-Globulin: 0.2 g/dL (ref 0.0–0.4)
Alpha-2-Globulin: 0.7 g/dL (ref 0.4–1.0)
Beta Globulin: 0.8 g/dL (ref 0.7–1.3)
Gamma Globulin: 0.2 g/dL — ABNORMAL LOW (ref 0.4–1.8)
Globulin, Total: 2 g/dL — ABNORMAL LOW (ref 2.2–3.9)
Total Protein ELP: 5.6 g/dL — ABNORMAL LOW (ref 6.0–8.5)

## 2022-04-20 ENCOUNTER — Telehealth: Payer: Self-pay | Admitting: Cardiovascular Disease

## 2022-04-20 ENCOUNTER — Other Ambulatory Visit: Payer: Self-pay

## 2022-04-20 MED ORDER — METOPROLOL TARTRATE 25 MG PO TABS: 12.5000 mg | ORAL_TABLET | Freq: Two times a day (BID) | ORAL | 0 refills | Status: DC

## 2022-04-20 NOTE — Telephone Encounter (Signed)
*  STAT* If patient is at the pharmacy, call can be transferred to refill team.   1. Which medications need to be refilled? (please list name of each medication and dose if known)   metoprolol tartrate (LOPRESSOR) 25 MG tablet    2. Which pharmacy/location (including street and city if local pharmacy) is medication to be sent to? [id number] [address]  3. Do they need a 30 day or 90 day supply?  90 day

## 2022-04-21 DIAGNOSIS — E1129 Type 2 diabetes mellitus with other diabetic kidney complication: Secondary | ICD-10-CM | POA: Diagnosis not present

## 2022-04-21 DIAGNOSIS — E1122 Type 2 diabetes mellitus with diabetic chronic kidney disease: Secondary | ICD-10-CM | POA: Diagnosis not present

## 2022-04-21 DIAGNOSIS — N189 Chronic kidney disease, unspecified: Secondary | ICD-10-CM | POA: Diagnosis not present

## 2022-04-21 DIAGNOSIS — I129 Hypertensive chronic kidney disease with stage 1 through stage 4 chronic kidney disease, or unspecified chronic kidney disease: Secondary | ICD-10-CM | POA: Diagnosis not present

## 2022-04-21 DIAGNOSIS — R809 Proteinuria, unspecified: Secondary | ICD-10-CM | POA: Diagnosis not present

## 2022-04-21 MED ORDER — METOPROLOL TARTRATE 25 MG PO TABS: 12.5000 mg | ORAL_TABLET | Freq: Two times a day (BID) | ORAL | 1 refills | Status: DC

## 2022-04-21 NOTE — Telephone Encounter (Signed)
[facility], per pt's request.

## 2022-04-22 ENCOUNTER — Inpatient Hospital Stay: Payer: Medicare Other | Attending: Family

## 2022-04-22 ENCOUNTER — Inpatient Hospital Stay: Payer: Medicare Other

## 2022-04-22 VITALS — BP 143/43 | HR 58 | Temp 98.0°F | Resp 17

## 2022-04-22 DIAGNOSIS — E119 Type 2 diabetes mellitus without complications: Secondary | ICD-10-CM | POA: Diagnosis not present

## 2022-04-22 DIAGNOSIS — L02214 Cutaneous abscess of groin: Secondary | ICD-10-CM | POA: Insufficient documentation

## 2022-04-22 DIAGNOSIS — C9000 Multiple myeloma not having achieved remission: Secondary | ICD-10-CM | POA: Insufficient documentation

## 2022-04-22 DIAGNOSIS — N289 Disorder of kidney and ureter, unspecified: Secondary | ICD-10-CM | POA: Diagnosis not present

## 2022-04-22 DIAGNOSIS — Z79899 Other long term (current) drug therapy: Secondary | ICD-10-CM | POA: Diagnosis not present

## 2022-04-22 DIAGNOSIS — Z5112 Encounter for antineoplastic immunotherapy: Secondary | ICD-10-CM | POA: Diagnosis not present

## 2022-04-22 LAB — CBC WITH DIFFERENTIAL (CANCER CENTER ONLY)
Abs Immature Granulocytes: 0.01 10*3/uL (ref 0.00–0.07)
Basophils Absolute: 0.1 10*3/uL (ref 0.0–0.1)
Basophils Relative: 2 %
Eosinophils Absolute: 0.1 10*3/uL (ref 0.0–0.5)
Eosinophils Relative: 3 %
HCT: 37.9 % — ABNORMAL LOW (ref 39.0–52.0)
Hemoglobin: 12.6 g/dL — ABNORMAL LOW (ref 13.0–17.0)
Immature Granulocytes: 0 %
Lymphocytes Relative: 25 %
Lymphs Abs: 1.2 10*3/uL (ref 0.7–4.0)
MCH: 30.2 pg (ref 26.0–34.0)
MCHC: 33.2 g/dL (ref 30.0–36.0)
MCV: 90.9 fL (ref 80.0–100.0)
Monocytes Absolute: 1 10*3/uL (ref 0.1–1.0)
Monocytes Relative: 22 %
Neutro Abs: 2.3 10*3/uL (ref 1.7–7.7)
Neutrophils Relative %: 48 %
Platelet Count: 185 10*3/uL (ref 150–400)
RBC: 4.17 MIL/uL — ABNORMAL LOW (ref 4.22–5.81)
RDW: 15.1 % (ref 11.5–15.5)
WBC Count: 4.7 10*3/uL (ref 4.0–10.5)
nRBC: 0 % (ref 0.0–0.2)

## 2022-04-22 LAB — CMP (CANCER CENTER ONLY)
ALT: 20 U/L (ref 0–44)
AST: 21 U/L (ref 15–41)
Albumin: 4.2 g/dL (ref 3.5–5.0)
Alkaline Phosphatase: 48 U/L (ref 38–126)
Anion gap: 7 (ref 5–15)
BUN: 26 mg/dL — ABNORMAL HIGH (ref 8–23)
CO2: 29 mmol/L (ref 22–32)
Calcium: 9.3 mg/dL (ref 8.9–10.3)
Chloride: 103 mmol/L (ref 98–111)
Creatinine: 1.45 mg/dL — ABNORMAL HIGH (ref 0.61–1.24)
GFR, Estimated: 50 mL/min — ABNORMAL LOW (ref >60)
Glucose, Bld: 170 mg/dL — ABNORMAL HIGH (ref 70–99)
Potassium: 4.5 mmol/L (ref 3.5–5.1)
Sodium: 139 mmol/L (ref 135–145)
Total Bilirubin: 0.6 mg/dL (ref 0.3–1.2)
Total Protein: 6.4 g/dL — ABNORMAL LOW (ref 6.5–8.1)

## 2022-04-22 MED ORDER — DEXAMETHASONE 4 MG PO TABS
4.0000 mg | ORAL_TABLET | Freq: Once | ORAL | Status: AC
  Administered 2022-04-22: 4 mg via ORAL
  Filled 2022-04-22: qty 1

## 2022-04-22 MED ORDER — LORAZEPAM 1 MG PO TABS
0.5000 mg | ORAL_TABLET | Freq: Once | ORAL | Status: AC
  Administered 2022-04-22: 0.5 mg via ORAL
  Filled 2022-04-22: qty 1

## 2022-04-22 MED ORDER — BORTEZOMIB CHEMO SQ INJECTION 3.5 MG (2.5MG/ML)
1.3000 mg/m2 | Freq: Once | INTRAMUSCULAR | Status: AC
  Administered 2022-04-22: 2.5 mg via SUBCUTANEOUS
  Filled 2022-04-22: qty 1

## 2022-04-22 NOTE — Patient Instructions (Signed)
[facility]  Discharge Instructions: Thank you for choosing [facility] to provide your oncology and hematology care.   If you have a lab appointment with the [facility], please go directly to the [facility] and check in at the registration area.  Wear comfortable clothing and clothing appropriate for easy access to any Portacath or PICC line.   We strive to give you quality time with your provider. You may need to reschedule your appointment if you arrive late (15 or more minutes).  Arriving late affects you and other patients whose appointments are after yours.  Also, if you miss three or more appointments without notifying the office, you may be dismissed from the clinic at the provider's discretion.      For prescription refill requests, have your pharmacy contact our office and allow 72 hours for refills to be completed.    Today you received the following chemotherapy and/or immunotherapy agents Velcade      To help prevent nausea and vomiting after your treatment, we encourage you to take your nausea medication as directed.  BELOW ARE SYMPTOMS THAT SHOULD BE REPORTED IMMEDIATELY: *FEVER GREATER THAN 100.4 F (38 C) OR HIGHER *CHILLS OR SWEATING *NAUSEA AND VOMITING THAT IS NOT CONTROLLED WITH YOUR NAUSEA MEDICATION *UNUSUAL SHORTNESS OF BREATH *UNUSUAL BRUISING OR BLEEDING *URINARY PROBLEMS (pain or burning when urinating, or frequent urination) *BOWEL PROBLEMS (unusual diarrhea, constipation, pain near the anus) TENDERNESS IN MOUTH AND THROAT WITH OR WITHOUT PRESENCE OF ULCERS (sore throat, sores in mouth, or a toothache) UNUSUAL RASH, SWELLING OR PAIN  UNUSUAL VAGINAL DISCHARGE OR ITCHING   Items with * indicate a potential emergency and should be followed up as soon as possible or go to the Emergency Department if any problems should occur.  Please show the CHEMOTHERAPY ALERT CARD or IMMUNOTHERAPY ALERT CARD at check-in to the  Emergency Department and triage nurse. Should you have questions after your visit or need to cancel or reschedule your appointment, please contact [facility]  [phone] and follow the prompts.  Office hours are 8:00 a.m. to 4:30 p.m. Monday - Friday. Please note that voicemails left after 4:00 p.m. may not be returned until the following business day.  We are closed weekends and major holidays. You have access to a nurse at all times for urgent questions. Please call the main number to the clinic [phone] and follow the prompts.  For any non-urgent questions, you may also contact your provider using MyChart. We now offer e-Visits for anyone 31 and older to request care online for non-urgent symptoms. For details visit [email or web address].   Also download the MyChart app! Go to the app store, search "MyChart", open the app, select [facility], and log in with your MyChart username and password.  Masks are optional in the cancer centers. If you would like for your care team to wear a mask while they are taking care of you, please let them know. You may have one support person who is at least 77 years old accompany you for your appointments.

## 2022-04-27 ENCOUNTER — Ambulatory Visit (HOSPITAL_COMMUNITY)
Admission: RE | Admit: 2022-04-27 | Discharge: 2022-04-27 | Disposition: A | Discharge status: Home / Self Care | Payer: Medicare Other | Source: Ambulatory Visit | Attending: Physician Assistant | Admitting: Physician Assistant

## 2022-04-27 DIAGNOSIS — I6523 Occlusion and stenosis of bilateral carotid arteries: Secondary | ICD-10-CM | POA: Diagnosis not present

## 2022-04-27 DIAGNOSIS — I779 Disorder of arteries and arterioles, unspecified: Secondary | ICD-10-CM | POA: Insufficient documentation

## 2022-04-28 ENCOUNTER — Encounter: Payer: Self-pay | Admitting: Physician Assistant

## 2022-04-30 ENCOUNTER — Telehealth: Payer: Self-pay | Admitting: *Deleted

## 2022-04-30 DIAGNOSIS — I6523 Occlusion and stenosis of bilateral carotid arteries: Secondary | ICD-10-CM

## 2022-04-30 NOTE — Telephone Encounter (Signed)
-----   Message from Liliane Shi, [place] sent at 04/28/2022  6:52 PM EST ----- Results sent to [name] via [facility]. See MyChart comment. PLAN: -Send copy to PCP -Repeat 1 year Richardson Dopp, [place]    04/28/2022 6:49 PM

## 2022-04-30 NOTE — Progress Notes (Signed)
Pt viewed results / recommendations via mychar  Copy sent to Dr. Raoul Pitch, PCP   Order placed for 1 year repeat study

## 2022-05-04 ENCOUNTER — Other Ambulatory Visit: Payer: Self-pay

## 2022-05-04 ENCOUNTER — Emergency Department (HOSPITAL_BASED_OUTPATIENT_CLINIC_OR_DEPARTMENT_OTHER)
Admission: EM | Admit: 2022-05-04 | Discharge: 2022-05-04 | Disposition: A | Discharge status: Home / Self Care | Payer: Medicare Other | Attending: Emergency Medicine | Admitting: Emergency Medicine

## 2022-05-04 ENCOUNTER — Encounter (HOSPITAL_BASED_OUTPATIENT_CLINIC_OR_DEPARTMENT_OTHER): Payer: Self-pay | Admitting: Emergency Medicine

## 2022-05-04 DIAGNOSIS — L03314 Cellulitis of groin: Secondary | ICD-10-CM | POA: Insufficient documentation

## 2022-05-04 DIAGNOSIS — L03116 Cellulitis of left lower limb: Secondary | ICD-10-CM | POA: Diagnosis not present

## 2022-05-04 DIAGNOSIS — L0390 Cellulitis, unspecified: Secondary | ICD-10-CM

## 2022-05-04 DIAGNOSIS — L089 Local infection of the skin and subcutaneous tissue, unspecified: Secondary | ICD-10-CM | POA: Diagnosis present

## 2022-05-04 LAB — COMPREHENSIVE METABOLIC PANEL
ALT: 18 U/L (ref 0–44)
AST: 20 U/L (ref 15–41)
Albumin: 3.7 g/dL (ref 3.5–5.0)
Alkaline Phosphatase: 57 U/L (ref 38–126)
Anion gap: 8 (ref 5–15)
BUN: 24 mg/dL — ABNORMAL HIGH (ref 8–23)
CO2: 26 mmol/L (ref 22–32)
Calcium: 8.6 mg/dL — ABNORMAL LOW (ref 8.9–10.3)
Chloride: 105 mmol/L (ref 98–111)
Creatinine, Ser: 1.66 mg/dL — ABNORMAL HIGH (ref 0.61–1.24)
GFR, Estimated: 42 mL/min — ABNORMAL LOW (ref >60)
Glucose, Bld: 148 mg/dL — ABNORMAL HIGH (ref 70–99)
Potassium: 3.7 mmol/L (ref 3.5–5.1)
Sodium: 139 mmol/L (ref 135–145)
Total Bilirubin: 0.5 mg/dL (ref 0.3–1.2)
Total Protein: 6.2 g/dL — ABNORMAL LOW (ref 6.5–8.1)

## 2022-05-04 LAB — URINALYSIS, ROUTINE W REFLEX MICROSCOPIC
Bilirubin Urine: NEGATIVE
Glucose, UA: NEGATIVE mg/dL
Ketones, ur: NEGATIVE mg/dL
Leukocytes,Ua: NEGATIVE
Nitrite: NEGATIVE
Protein, ur: 30 mg/dL — AB
Specific Gravity, Urine: 1.02 (ref 1.005–1.030)
pH: 5.5 (ref 5.0–8.0)

## 2022-05-04 LAB — CBC WITH DIFFERENTIAL/PLATELET
Abs Immature Granulocytes: 0.02 K/uL (ref 0.00–0.07)
Basophils Absolute: 0.1 K/uL (ref 0.0–0.1)
Basophils Relative: 2 %
Eosinophils Absolute: 0.2 K/uL (ref 0.0–0.5)
Eosinophils Relative: 3 %
HCT: 37.1 % — ABNORMAL LOW (ref 39.0–52.0)
Hemoglobin: 12.3 g/dL — ABNORMAL LOW (ref 13.0–17.0)
Immature Granulocytes: 0 %
Lymphocytes Relative: 15 %
Lymphs Abs: 1 K/uL (ref 0.7–4.0)
MCH: 29.9 pg (ref 26.0–34.0)
MCHC: 33.2 g/dL (ref 30.0–36.0)
MCV: 90.3 fL (ref 80.0–100.0)
Monocytes Absolute: 0.6 K/uL (ref 0.1–1.0)
Monocytes Relative: 9 %
Neutro Abs: 4.7 K/uL (ref 1.7–7.7)
Neutrophils Relative %: 71 %
Platelets: 151 K/uL (ref 150–400)
RBC: 4.11 MIL/uL — ABNORMAL LOW (ref 4.22–5.81)
RDW: 15.3 % (ref 11.5–15.5)
WBC: 6.7 K/uL (ref 4.0–10.5)
nRBC: 0 % (ref 0.0–0.2)

## 2022-05-04 LAB — LACTIC ACID, PLASMA: Lactic Acid, Venous: 1.2 mmol/L (ref 0.5–1.9)

## 2022-05-04 LAB — URINALYSIS, MICROSCOPIC (REFLEX)

## 2022-05-04 MED ORDER — SULFAMETHOXAZOLE-TRIMETHOPRIM 800-160 MG PO TABS: 1.0000 | ORAL_TABLET | Freq: Once | ORAL | Status: DC

## 2022-05-04 MED ORDER — SULFAMETHOXAZOLE-TRIMETHOPRIM 800-160 MG PO TABS: 1.0000 | ORAL_TABLET | Freq: Two times a day (BID) | ORAL | 0 refills | Status: AC

## 2022-05-04 NOTE — ED Provider Notes (Signed)
[facility] EMERGENCY DEPARTMENT Provider Note   CSN: [id number] Arrival date & time: 05/04/22  1223     History Chief Complaint  Patient presents with   Recurrent Skin Infections    HPI Marvin Salinas is a 76 y.o. male presenting for soft tissue skin infection of his left groin.  He endorses a history of hidradenitis and recurrent abscesses at this location.  He states that he has been being careful with his hygiene but unfortunately more lesions have popped up this week.  He states that antibiotics typically resolve these lesions.  He denies fevers or chills, nausea vomiting compassing shortness of breath.  He is otherwise ambulatory tolerating p.o. intake.   Patient's recorded medical, surgical, social, medication list and allergies were reviewed in the Snapshot window as part of the initial history.   Review of Systems   Review of Systems  Constitutional:  Negative for chills and fever.  HENT:  Negative for ear pain and sore throat.   Eyes:  Negative for pain and visual disturbance.  Respiratory:  Negative for cough and shortness of breath.   Cardiovascular:  Negative for chest pain and palpitations.  Gastrointestinal:  Negative for abdominal pain and vomiting.  Genitourinary:  Negative for dysuria and hematuria.  Musculoskeletal:  Negative for arthralgias and back pain.  Skin:  Positive for rash. Negative for color change.  Neurological:  Negative for seizures and syncope.  All other systems reviewed and are negative.   Physical Exam Updated Vital Signs BP 137/76 (BP Location: Right Arm)   Pulse (!) 52   Temp 97.6 F (36.4 C) (Oral)   Resp 17   Ht _0  (1.727 m)   Wt 72.6 kg   SpO2 96%   BMI 24.33 kg/m  Physical Exam Vitals and nursing note reviewed.  Constitutional:      General: He is not in acute distress.    Appearance: He is well-developed.  HENT:     Head: Normocephalic and atraumatic.  Eyes:     Conjunctiva/sclera: Conjunctivae normal.   Cardiovascular:     Rate and Rhythm: Normal rate and regular rhythm.     Heart sounds: No murmur heard. Pulmonary:     Effort: Pulmonary effort is normal. No respiratory distress.     Breath sounds: Normal breath sounds.  Abdominal:     Palpations: Abdomen is soft.     Tenderness: There is no abdominal tenderness.  Musculoskeletal:        General: No swelling.     Cervical back: Neck supple.  Skin:    General: Skin is warm and dry.     Capillary Refill: Capillary refill takes less than 2 seconds.     Findings: Lesion (Multiple small swelling to the patient's left groin.  Small abscesses appreciated.  Most are already draining..  No large lymphadenopathy appreciated.) and rash present.  Neurological:     Mental Status: He is alert.  Psychiatric:        Mood and Affect: Mood normal.      ED Course/ Medical Decision Making/ A&P    Procedures Procedures   Medications Ordered in ED Medications - No data to display  Medical Decision Making:    SLADE PIERPOINT is a 76 y.o. male who presented to the ED today with recurrent soft tissue infections detailed above.     Patient's presentation is complicated by their history of multiple comorbid medical problems including active multiple myeloma.  Patient placed on continuous vitals and  telemetry monitoring while in ED which was reviewed periodically.   Complete initial physical exam performed, notably the patient  was hemodynamically stable in no acute distress.      Reviewed and confirmed nursing documentation for past medical history, family history, social history.    Initial Assessment:   Patient's history present on his physical examination was consistent with nonspecific soft tissue skin infection. We will evaluate for more systemic disease given his multiple medical problems with screening labs including CBC, CMP, urinalysis to evaluate for alternative etiology. Screening labs reviewed and grossly reassuring at this  time. Patient observed in emergency room for 2 hours without any decompensation of vital signs or symptoms.  Given his overall well appearance, do not believe any further intervention in the emergency department would be of benefit.  We will treat soft tissue skin infections with Bactrim twice daily x5 days and plan for patient to follow-up with primary care provider within 48 hours for reassessment to ensure ongoing healing. Disposition:  I have considered need for hospitalization, however, considering all of the above, I believe this patient is stable for discharge at this time.  Patient/family educated about specific return precautions for given chief complaint and symptoms.  Patient/family educated about follow-up with PCP .     Patient/family expressed understanding of return precautions and need for follow-up. Patient spoken to regarding all imaging and laboratory results and appropriate follow up for these results. All education provided in verbal form with additional information in written form. Time was allowed for answering of patient questions. Patient discharged.    Emergency Department Medication Summary:   Medications - No data to display      Clinical Impression:  1. Cellulitis, unspecified cellulitis site      Discharge   Final Clinical Impression(s) / ED Diagnoses Final diagnoses:  Cellulitis, unspecified cellulitis site    Rx / DC Orders ED Discharge Orders          Ordered    sulfamethoxazole-trimethoprim (BACTRIM DS) 800-160 MG tablet  2 times daily        05/04/22 1430              Tretha Sciara, MD 05/04/22 1435

## 2022-05-04 NOTE — Discharge Instructions (Addendum)
You were seen today for recurrent soft tissue infection.  After conversation, you have a history of similar lesions.  We will prescribe you Bactrim to take for the next 7 days.  Return if your symptoms are uncontrolled.  For the opportunity participate in your care, Tretha Sciara MD

## 2022-05-04 NOTE — ED Triage Notes (Signed)
Recurrent skin infection left groin , was seen and treated , no relief . Cancer pt , last chemo 10 days ago

## 2022-05-12 ENCOUNTER — Inpatient Hospital Stay (HOSPITAL_BASED_OUTPATIENT_CLINIC_OR_DEPARTMENT_OTHER): Payer: Medicare Other | Admitting: Family

## 2022-05-12 ENCOUNTER — Inpatient Hospital Stay: Payer: Medicare Other

## 2022-05-12 VITALS — BP 131/50 | HR 53 | Temp 97.9°F | Resp 16 | Ht 68.0 in | Wt 165.0 lb

## 2022-05-12 DIAGNOSIS — C9000 Multiple myeloma not having achieved remission: Secondary | ICD-10-CM

## 2022-05-12 DIAGNOSIS — E119 Type 2 diabetes mellitus without complications: Secondary | ICD-10-CM | POA: Diagnosis not present

## 2022-05-12 DIAGNOSIS — L02214 Cutaneous abscess of groin: Secondary | ICD-10-CM | POA: Diagnosis not present

## 2022-05-12 DIAGNOSIS — Z5112 Encounter for antineoplastic immunotherapy: Secondary | ICD-10-CM | POA: Diagnosis not present

## 2022-05-12 DIAGNOSIS — N289 Disorder of kidney and ureter, unspecified: Secondary | ICD-10-CM | POA: Diagnosis not present

## 2022-05-12 DIAGNOSIS — Z79899 Other long term (current) drug therapy: Secondary | ICD-10-CM | POA: Diagnosis not present

## 2022-05-12 LAB — CMP (CANCER CENTER ONLY)
ALT: 32 U/L (ref 0–44)
AST: 24 U/L (ref 15–41)
Albumin: 4.5 g/dL (ref 3.5–5.0)
Alkaline Phosphatase: 48 U/L (ref 38–126)
Anion gap: 9 (ref 5–15)
BUN: 31 mg/dL — ABNORMAL HIGH (ref 8–23)
CO2: 25 mmol/L (ref 22–32)
Calcium: 9.4 mg/dL (ref 8.9–10.3)
Chloride: 104 mmol/L (ref 98–111)
Creatinine: 2.26 mg/dL — ABNORMAL HIGH (ref 0.61–1.24)
GFR, Estimated: 29 mL/min — ABNORMAL LOW (ref >60)
Glucose, Bld: 174 mg/dL — ABNORMAL HIGH (ref 70–99)
Potassium: 4.7 mmol/L (ref 3.5–5.1)
Sodium: 138 mmol/L (ref 135–145)
Total Bilirubin: 0.6 mg/dL (ref 0.3–1.2)
Total Protein: 6.3 g/dL — ABNORMAL LOW (ref 6.5–8.1)

## 2022-05-12 LAB — CBC WITH DIFFERENTIAL (CANCER CENTER ONLY)
Abs Immature Granulocytes: 0.01 10*3/uL (ref 0.00–0.07)
Basophils Absolute: 0.1 10*3/uL (ref 0.0–0.1)
Basophils Relative: 3 %
Eosinophils Absolute: 0.4 10*3/uL (ref 0.0–0.5)
Eosinophils Relative: 11 %
HCT: 37.4 % — ABNORMAL LOW (ref 39.0–52.0)
Hemoglobin: 12.3 g/dL — ABNORMAL LOW (ref 13.0–17.0)
Immature Granulocytes: 0 %
Lymphocytes Relative: 27 %
Lymphs Abs: 0.9 10*3/uL (ref 0.7–4.0)
MCH: 30.1 pg (ref 26.0–34.0)
MCHC: 32.9 g/dL (ref 30.0–36.0)
MCV: 91.4 fL (ref 80.0–100.0)
Monocytes Absolute: 0.4 10*3/uL (ref 0.1–1.0)
Monocytes Relative: 12 %
Neutro Abs: 1.5 10*3/uL — ABNORMAL LOW (ref 1.7–7.7)
Neutrophils Relative %: 47 %
Platelet Count: 100 10*3/uL — ABNORMAL LOW (ref 150–400)
RBC: 4.09 MIL/uL — ABNORMAL LOW (ref 4.22–5.81)
RDW: 15.3 % (ref 11.5–15.5)
WBC Count: 3.3 10*3/uL — ABNORMAL LOW (ref 4.0–10.5)
nRBC: 0 % (ref 0.0–0.2)

## 2022-05-12 LAB — LACTATE DEHYDROGENASE: LDH: 166 U/L (ref 98–192)

## 2022-05-12 MED ORDER — ACETAMINOPHEN 325 MG PO TABS
650.0000 mg | ORAL_TABLET | Freq: Once | ORAL | Status: AC
  Administered 2022-05-12: 650 mg via ORAL
  Filled 2022-05-12: qty 2

## 2022-05-12 MED ORDER — BORTEZOMIB CHEMO SQ INJECTION 3.5 MG (2.5MG/ML)
1.3000 mg/m2 | Freq: Once | INTRAMUSCULAR | Status: AC
  Administered 2022-05-12: 2.5 mg via SUBCUTANEOUS
  Filled 2022-05-12: qty 1

## 2022-05-12 MED ORDER — LORAZEPAM 1 MG PO TABS
0.5000 mg | ORAL_TABLET | Freq: Once | ORAL | Status: AC
  Administered 2022-05-12: 0.5 mg via ORAL
  Filled 2022-05-12: qty 1

## 2022-05-12 MED ORDER — DIPHENHYDRAMINE HCL 25 MG PO CAPS
50.0000 mg | ORAL_CAPSULE | Freq: Once | ORAL | Status: AC
  Administered 2022-05-12: 50 mg via ORAL
  Filled 2022-05-12: qty 2

## 2022-05-12 MED ORDER — DARATUMUMAB-HYALURONIDASE-FIHJ 1800-30000 MG-UT/15ML ~~LOC~~ SOLN
1800.0000 mg | Freq: Once | SUBCUTANEOUS | Status: AC
  Administered 2022-05-12: 1800 mg via SUBCUTANEOUS
  Filled 2022-05-12: qty 15

## 2022-05-12 MED ORDER — DEXAMETHASONE 4 MG PO TABS
20.0000 mg | ORAL_TABLET | Freq: Once | ORAL | Status: AC
  Administered 2022-05-12: 20 mg via ORAL
  Filled 2022-05-12: qty 5

## 2022-05-12 NOTE — Patient Instructions (Signed)
[facility]  Discharge Instructions: Thank you for choosing [facility] to provide your oncology and hematology care.   If you have a lab appointment with the [facility], please go directly to the [facility] and check in at the registration area.  Wear comfortable clothing and clothing appropriate for easy access to any Portacath or PICC line.   We strive to give you quality time with your provider. You may need to reschedule your appointment if you arrive late (15 or more minutes).  Arriving late affects you and other patients whose appointments are after yours.  Also, if you miss three or more appointments without notifying the office, you may be dismissed from the clinic at the provider's discretion.      For prescription refill requests, have your pharmacy contact our office and allow 72 hours for refills to be completed.  Today you received the following chemotherapy and/or immunotherapy agents Velcade and Faspro.   To help prevent nausea and vomiting after your treatment, we encourage you to take your nausea medication as directed.  BELOW ARE SYMPTOMS THAT SHOULD BE REPORTED IMMEDIATELY: *FEVER GREATER THAN 100.4 F (38 C) OR HIGHER *CHILLS OR SWEATING *NAUSEA AND VOMITING THAT IS NOT CONTROLLED WITH YOUR NAUSEA MEDICATION *UNUSUAL SHORTNESS OF BREATH *UNUSUAL BRUISING OR BLEEDING *URINARY PROBLEMS (pain or burning when urinating, or frequent urination) *BOWEL PROBLEMS (unusual diarrhea, constipation, pain near the anus) TENDERNESS IN MOUTH AND THROAT WITH OR WITHOUT PRESENCE OF ULCERS (sore throat, sores in mouth, or a toothache) UNUSUAL RASH, SWELLING OR PAIN  UNUSUAL VAGINAL DISCHARGE OR ITCHING   Items with * indicate a potential emergency and should be followed up as soon as possible or go to the Emergency Department if any problems should occur.  Please show the CHEMOTHERAPY ALERT CARD or IMMUNOTHERAPY ALERT CARD at check-in to  the Emergency Department and triage nurse. Should you have questions after your visit or need to cancel or reschedule your appointment, please contact [facility]  [phone] and follow the prompts.  Office hours are 8:00 a.m. to 4:30 p.m. Monday - Friday. Please note that voicemails left after 4:00 p.m. may not be returned until the following business day.  We are closed weekends and major holidays. You have access to a nurse at all times for urgent questions. Please call the main number to the clinic [phone] and follow the prompts.  For any non-urgent questions, you may also contact your provider using MyChart. We now offer e-Visits for anyone 75 and older to request care online for non-urgent symptoms. For details visit [email or web address].   Also download the MyChart app! Go to the app store, search "MyChart", open the app, select [facility], and log in with your MyChart username and password.  Masks are optional in the cancer centers. If you would like for your care team to wear a mask while they are taking care of you, please let them know. You may have one support person who is at least 77 years old accompany you for your appointments.

## 2022-05-12 NOTE — Progress Notes (Signed)
Hematology and Oncology Follow Up Visit  Marvin Salinas [id number] 11/04/1945 76 y.o. 05/12/2022   Principle Diagnosis:  Lambda light chain myeloma --normal cytogenetics   Current Therapy:        Faspro/Velcade/Revlimid -- s/p cycle 5 -- start on 11/05/2021   Interim History:  Marvin Salinas is here today for follow-up and treatment. He is doing well but notes persistent fatigue.  In October, No m-spike was detected, IgG level 332 mg/dL and lambda light chains 4.97 mg/dL.  He has dizziness and mild SOB with episodes of atrial fib.  No fever, chills, n/v, cough, rash, chest pain, abdominal pain or changes in bowel or bladder habits.  Occasional numbness and tingling in his toes as well as the pinky and ring fingers of the left hand.  He is looking for a new eye doctor. He has been having blurry vision in the left eye at times and states that when he recently discussed at an eye appointment they did not do an exam.  He did have a fall when he stood up quickly from a chair 5-6 weeks ago and tipped forward. Thankfully he was not injured.  No blood loss, bruising or petechiae.  Appetite and hydration have been good. Weight is stable at 165 lbs.   ECOG Performance Status: 1 - Symptomatic but completely ambulatory  Medications:  Allergies as of 05/12/2022       Reactions   Codeine Nausea And Vomiting   Lisinopril Cough   Nsaids Other (See Comments)   CKD   Dapagliflozin Rash   Latex Hives        Medication List        Accurate as of May 12, 2022  8:50 AM. If you have any questions, ask your nurse or doctor.          STOP taking these medications    nystatin cream Commonly known as: MYCOSTATIN Stopped by: Lottie Dawson, NP       TAKE these medications    BD Insulin Syringe U/F 31G X 5/16" 0.5 ML Misc Generic drug: Insulin Syringe-Needle U-100 MONITOR BLOOD GLUCOSE 3 TIMES DAILY   blood glucose meter kit and supplies Kit Dispense based on patient and insurance  preference. Use up to four times daily as directed.   dofetilide 125 MCG capsule Commonly known as: TIKOSYN TAKE 1 CAPSULE BY MOUTH TWICE A DAY   Eliquis 5 MG Tabs tablet Generic drug: apixaban TAKE 1 TABLET BY MOUTH TWICE A DAY   ezetimibe 10 MG tablet Commonly known as: ZETIA Take 1 tablet (10 mg total) by mouth daily.   famciclovir 250 MG tablet Commonly known as: FAMVIR Take 1 tablet (250 mg total) by mouth daily.   fluticasone 50 MCG/ACT nasal spray Commonly known as: FLONASE Place 2 sprays into both nostrils daily.   GENTEAL OP Place 1 drop into both eyes daily as needed (dry eyes).   insulin glargine 100 UNIT/ML injection Commonly known as: LANTUS 30 units subcu injection in the morning and 35 units subcu injection before bed.   lenalidomide 10 MG capsule Commonly known as: REVLIMID Take 1 capsule (10 mg total) by mouth daily. [employer] Auth #[id number]   loratadine 10 MG tablet Commonly known as: CLARITIN Take 10 mg by mouth daily as needed for allergies.   methocarbamol 500 MG tablet Commonly known as: ROBAXIN Take 500 mg by mouth daily.   metoprolol tartrate 25 MG tablet Commonly known as: LOPRESSOR Take 0.5 tablets (12.5 mg total) by mouth 2 (  two) times daily.   nitroGLYCERIN 0.4 MG SL tablet Commonly known as: NITROSTAT Place 1 tablet under the tongue every 5 (five) minutes as needed.   nitroGLYCERIN 0.4 MG SL tablet Commonly known as: NITROSTAT PLACE 1 TABLET (0.4 MG TOTAL) UNDER THE TONGUE EVERY 5 (FIVE) MINUTES AS NEEDED FOR CHEST PAIN.   olmesartan 40 MG tablet Commonly known as: BENICAR Take 20 mg by mouth 2 (two) times daily.   ondansetron 8 MG tablet Commonly known as: ZOFRAN Take 1 tablet (8 mg total) by mouth every 8 (eight) hours as needed for nausea or vomiting.   OneTouch Delica Plus Lancet33G Misc USE UP TO FOUR TIMES DAILY AS DIRECTED **EMERGENCY FILL**   OneTouch Verio test strip Generic drug: glucose blood USE UP TO FOUR TIMES  DAILY AS DIRECTED.   polyethylene glycol powder 17 GM/SCOOP powder Commonly known as: GLYCOLAX/MIRALAX Take by mouth.   rosuvastatin 20 MG tablet Commonly known as: CRESTOR Take 1 tablet (20 mg total) by mouth at bedtime.   Rybelsus 7 MG Tabs Generic drug: Semaglutide Take 1 tablet by mouth daily with breakfast.        Allergies:  Allergies  Allergen Reactions   Codeine Nausea And Vomiting   Lisinopril Cough   Nsaids Other (See Comments)    CKD   Dapagliflozin Rash   Latex Hives    Past Medical History, Surgical history, Social history, and Family History were reviewed and updated.  Review of Systems: All other 10 point review of systems is negative.   Physical Exam:  height is 5' 8" (1.727 m) and weight is 165 lb (74.8 kg). His oral temperature is 97.9 F (36.6 C). His blood pressure is 131/50 (abnormal) and his pulse is 53 (abnormal). His respiration is 16 and oxygen saturation is 98%.   Wt Readings from Last 3 Encounters:  05/12/22 165 lb (74.8 kg)  05/04/22 160 lb (72.6 kg)  04/15/22 170 lb 8 oz (77.3 kg)    Ocular: Sclerae unicteric, pupils equal, round and reactive to light Ear-nose-throat: Oropharynx clear, dentition fair Lymphatic: No cervical or supraclavicular adenopathy Lungs no rales or rhonchi, good excursion bilaterally Heart regular rate and rhythm, no murmur appreciated Abd soft, nontender, positive bowel sounds MSK no focal spinal tenderness, no joint edema Neuro: non-focal, well-oriented, appropriate affect Breasts: Deferred   Lab Results  Component Value Date   WBC 3.3 (L) 05/12/2022   HGB 12.3 (L) 05/12/2022   HCT 37.4 (L) 05/12/2022   MCV 91.4 05/12/2022   PLT 100 (L) 05/12/2022   Lab Results  Component Value Date   FERRITIN 45 10/28/2021   IRON 200 (H) 10/28/2021   TIBC 365 10/28/2021   UIBC 165 10/28/2021   IRONPCTSAT 55 (H) 10/28/2021   Lab Results  Component Value Date   RETICCTPCT 1.5 02/04/2021   RBC 4.09 (L)  05/12/2022   Lab Results  Component Value Date   KPAFRELGTCHN 28.7 (H) 04/15/2022   LAMBDASER 49.7 (H) 04/15/2022   KAPLAMBRATIO 0.58 04/15/2022   Lab Results  Component Value Date   IGGSERUM 332 (L) 04/15/2022   IGA 51 (L) 04/15/2022   IGMSERUM 11 (L) 04/15/2022   Lab Results  Component Value Date   TOTALPROTELP 5.6 (L) 04/15/2022   ALBUMINELP 3.6 04/15/2022   A1GS 0.2 04/15/2022   A2GS 0.7 04/15/2022   BETS 0.8 04/15/2022   GAMS 0.2 (L) 04/15/2022   MSPIKE Not Observed 04/15/2022   SPEI Comment 10/28/2021     Chemistry        Component Value Date/Time   NA 138 05/12/2022 0753   NA 140 08/31/2021 1026   K 4.7 05/12/2022 0753   CL 104 05/12/2022 0753   CO2 25 05/12/2022 0753   BUN 31 (H) 05/12/2022 0753   BUN 21 08/31/2021 1026   CREATININE 2.26 (H) 05/12/2022 0753      Component Value Date/Time   CALCIUM 9.4 05/12/2022 0753   ALKPHOS 48 05/12/2022 0753   AST 24 05/12/2022 0753   ALT 32 05/12/2022 0753   BILITOT 0.6 05/12/2022 0753       Impression and Plan: Marvin Salinas is a very pleasant 76 yo caucasian gentleman with lambda light chain myeloma and associated renal insufficiency.  Labs reviewed with Dr. Ennever and ok to treat today as planned.  Follow-up in 4 weeks.  Sarah Carter, NP 11/22/20238:50 AM  

## 2022-05-14 LAB — KAPPA/LAMBDA LIGHT CHAINS
Kappa free light chain: 29.7 mg/L — ABNORMAL HIGH (ref 3.3–19.4)
Kappa, lambda light chain ratio: 0.79 (ref 0.26–1.65)
Lambda free light chains: 37.7 mg/L — ABNORMAL HIGH (ref 5.7–26.3)

## 2022-05-14 LAB — IGG, IGA, IGM
IgA: 58 mg/dL — ABNORMAL LOW (ref 61–437)
IgG (Immunoglobin G), Serum: 311 mg/dL — ABNORMAL LOW (ref 603–1613)
IgM (Immunoglobulin M), Srm: 11 mg/dL — ABNORMAL LOW (ref 15–143)

## 2022-05-17 ENCOUNTER — Other Ambulatory Visit: Payer: Self-pay | Admitting: *Deleted

## 2022-05-17 DIAGNOSIS — C9000 Multiple myeloma not having achieved remission: Secondary | ICD-10-CM

## 2022-05-17 MED ORDER — LENALIDOMIDE 10 MG PO CAPS: 10.0000 mg | ORAL_CAPSULE | Freq: Every day | ORAL | 0 refills | Status: DC

## 2022-05-18 LAB — PROTEIN ELECTROPHORESIS, SERUM, WITH REFLEX
A/G Ratio: 1.6 (ref 0.7–1.7)
Albumin ELP: 3.6 g/dL (ref 2.9–4.4)
Alpha-1-Globulin: 0.2 g/dL (ref 0.0–0.4)
Alpha-2-Globulin: 0.8 g/dL (ref 0.4–1.0)
Beta Globulin: 0.9 g/dL (ref 0.7–1.3)
Gamma Globulin: 0.3 g/dL — ABNORMAL LOW (ref 0.4–1.8)
Globulin, Total: 2.2 g/dL (ref 2.2–3.9)
Total Protein ELP: 5.8 g/dL — ABNORMAL LOW (ref 6.0–8.5)

## 2022-05-20 ENCOUNTER — Other Ambulatory Visit: Payer: Self-pay

## 2022-05-20 ENCOUNTER — Encounter: Payer: Self-pay | Admitting: Hematology & Oncology

## 2022-05-20 ENCOUNTER — Inpatient Hospital Stay: Payer: Medicare Other

## 2022-05-20 ENCOUNTER — Inpatient Hospital Stay (HOSPITAL_BASED_OUTPATIENT_CLINIC_OR_DEPARTMENT_OTHER): Payer: Medicare Other | Admitting: Hematology & Oncology

## 2022-05-20 VITALS — BP 125/49 | HR 60 | Temp 97.6°F | Resp 17 | Ht 68.0 in | Wt 165.0 lb

## 2022-05-20 DIAGNOSIS — L02214 Cutaneous abscess of groin: Secondary | ICD-10-CM | POA: Diagnosis not present

## 2022-05-20 DIAGNOSIS — C9000 Multiple myeloma not having achieved remission: Secondary | ICD-10-CM

## 2022-05-20 DIAGNOSIS — Z79899 Other long term (current) drug therapy: Secondary | ICD-10-CM | POA: Diagnosis not present

## 2022-05-20 DIAGNOSIS — E119 Type 2 diabetes mellitus without complications: Secondary | ICD-10-CM | POA: Diagnosis not present

## 2022-05-20 DIAGNOSIS — N289 Disorder of kidney and ureter, unspecified: Secondary | ICD-10-CM | POA: Diagnosis not present

## 2022-05-20 DIAGNOSIS — Z5112 Encounter for antineoplastic immunotherapy: Secondary | ICD-10-CM | POA: Diagnosis not present

## 2022-05-20 LAB — CMP (CANCER CENTER ONLY)
ALT: 47 U/L — ABNORMAL HIGH (ref 0–44)
AST: 28 U/L (ref 15–41)
Albumin: 4.1 g/dL (ref 3.5–5.0)
Alkaline Phosphatase: 51 U/L (ref 38–126)
Anion gap: 7 (ref 5–15)
BUN: 28 mg/dL — ABNORMAL HIGH (ref 8–23)
CO2: 28 mmol/L (ref 22–32)
Calcium: 9.2 mg/dL (ref 8.9–10.3)
Chloride: 106 mmol/L (ref 98–111)
Creatinine: 1.78 mg/dL — ABNORMAL HIGH (ref 0.61–1.24)
GFR, Estimated: 39 mL/min — ABNORMAL LOW (ref >60)
Glucose, Bld: 178 mg/dL — ABNORMAL HIGH (ref 70–99)
Potassium: 4.6 mmol/L (ref 3.5–5.1)
Sodium: 141 mmol/L (ref 135–145)
Total Bilirubin: 0.5 mg/dL (ref 0.3–1.2)
Total Protein: 5.7 g/dL — ABNORMAL LOW (ref 6.5–8.1)

## 2022-05-20 LAB — CBC WITH DIFFERENTIAL (CANCER CENTER ONLY)
Abs Immature Granulocytes: 0.01 10*3/uL (ref 0.00–0.07)
Basophils Absolute: 0.1 10*3/uL (ref 0.0–0.1)
Basophils Relative: 3 %
Eosinophils Absolute: 0.1 10*3/uL (ref 0.0–0.5)
Eosinophils Relative: 4 %
HCT: 35.4 % — ABNORMAL LOW (ref 39.0–52.0)
Hemoglobin: 11.8 g/dL — ABNORMAL LOW (ref 13.0–17.0)
Immature Granulocytes: 0 %
Lymphocytes Relative: 26 %
Lymphs Abs: 0.9 10*3/uL (ref 0.7–4.0)
MCH: 30.4 pg (ref 26.0–34.0)
MCHC: 33.3 g/dL (ref 30.0–36.0)
MCV: 91.2 fL (ref 80.0–100.0)
Monocytes Absolute: 0.8 10*3/uL (ref 0.1–1.0)
Monocytes Relative: 24 %
Neutro Abs: 1.5 10*3/uL — ABNORMAL LOW (ref 1.7–7.7)
Neutrophils Relative %: 43 %
Platelet Count: 131 10*3/uL — ABNORMAL LOW (ref 150–400)
RBC: 3.88 MIL/uL — ABNORMAL LOW (ref 4.22–5.81)
RDW: 15.3 % (ref 11.5–15.5)
WBC Count: 3.4 10*3/uL — ABNORMAL LOW (ref 4.0–10.5)
nRBC: 0 % (ref 0.0–0.2)

## 2022-05-20 NOTE — Progress Notes (Signed)
Hematology and Oncology Follow Up Visit  Marvin Salinas [id number] [birth year] 76 y.o. 05/20/2022   Principle Diagnosis:  Lambda light chain myeloma --normal cytogenetics  Current Therapy:   Faspro/Velcade/Revlimid -- s/p cycle #6 -- start on 11/05/2021     Interim History:  Marvin Salinas is back for an unscheduled visit.  Apparently, he developed an abscess in the left groin.  He had this the week or so ago.  He went to the ER.  They put him on an antibiotic.  He said the antibiotic helped him a little bit.  However, he ran out of the antibiotic.  Once he ran out of the antibiotic, the abscess seem to come back.  In the office, he does have an abscess that has a "hole" in it.  There is expressed some purulent bloody discharge.  We did culture this.  I think the problem is that he has the diabetes.  I think this is going to clearly make the abscess more difficult to heal up.  Thankfully, his blood counts are okay.  He is not neutropenic.  We will have to see if The surgery can see him and see about opening this up.  I think this may need to be opened up so that it can be drained efficiently.  We are going to hold on his chemotherapy today.  I think that we probably have to hold the chemotherapy for a couple of weeks.  Thankfully, his myeloma think has been doing pretty well.  He has a lambda light chain myeloma.  His last lambda light chain level was 3.8 mg/dL.  Currently, I would say his performance status is probably ECOG 1.    Medications:  Current Outpatient Medications:    BD INSULIN SYRINGE U/F 31G X 5/16" 0.5 ML MISC, MONITOR BLOOD GLUCOSE 3 TIMES DAILY, Disp: 100 each, Rfl: 4   blood glucose meter kit and supplies KIT, Dispense based on patient and insurance preference. Use up to four times daily as directed., Disp: 1 each, Rfl: 0   Carboxymethylcell-Hypromellose (GENTEAL OP), Place 1 drop into both eyes daily as needed (dry eyes)., Disp: , Rfl:    dofetilide (TIKOSYN) 125 MCG  capsule, TAKE 1 CAPSULE BY MOUTH TWICE A DAY, Disp: 180 capsule, Rfl: 3   ELIQUIS 5 MG TABS tablet, TAKE 1 TABLET BY MOUTH TWICE A DAY, Disp: 180 tablet, Rfl: 1   ezetimibe (ZETIA) 10 MG tablet, Take 1 tablet (10 mg total) by mouth daily., Disp: 90 tablet, Rfl: 3   famciclovir (FAMVIR) 250 MG tablet, Take 1 tablet (250 mg total) by mouth daily., Disp: 30 tablet, Rfl: 12   fluticasone (FLONASE) 50 MCG/ACT nasal spray, Place 2 sprays into both nostrils daily., Disp: , Rfl:    insulin glargine (LANTUS) 100 UNIT/ML injection, 30 units subcu injection in the morning and 35 units subcu injection before bed., Disp: 30 mL, Rfl: 5   Lancets (ONETOUCH DELICA PLUS [id number]) MISC, USE UP TO FOUR TIMES DAILY AS DIRECTED **EMERGENCY FILL**, Disp: 100 each, Rfl: 11   lenalidomide (REVLIMID) 10 MG capsule, Take 1 capsule (10 mg total) by mouth daily. [employer] Josem Kaufmann #[id number], Disp: 21 capsule, Rfl: 0   loratadine (CLARITIN) 10 MG tablet, Take 10 mg by mouth daily as needed for allergies., Disp: , Rfl:    methocarbamol (ROBAXIN) 500 MG tablet, Take 500 mg by mouth daily., Disp: , Rfl:    metoprolol tartrate (LOPRESSOR) 25 MG tablet, Take 0.5 tablets (12.5 mg total) by mouth 2 (two)  times daily., Disp: 90 tablet, Rfl: 1   nitroGLYCERIN (NITROSTAT) 0.4 MG SL tablet, PLACE 1 TABLET (0.4 MG TOTAL) UNDER THE TONGUE EVERY 5 (FIVE) MINUTES AS NEEDED FOR CHEST PAIN., Disp: 25 tablet, Rfl: 12   nitroGLYCERIN (NITROSTAT) 0.4 MG SL tablet, Place 1 tablet under the tongue every 5 (five) minutes as needed. (Patient not taking: Reported on 05/12/2022), Disp: , Rfl:    olmesartan (BENICAR) 40 MG tablet, Take 20 mg by mouth 2 (two) times daily., Disp: , Rfl:    ondansetron (ZOFRAN) 8 MG tablet, Take 1 tablet (8 mg total) by mouth every 8 (eight) hours as needed for nausea or vomiting., Disp: 30 tablet, Rfl: 1   ONETOUCH VERIO test strip, USE UP TO FOUR TIMES DAILY AS DIRECTED., Disp: 100 strip, Rfl: 3   polyethylene glycol powder  (GLYCOLAX/MIRALAX) 17 GM/SCOOP powder, Take by mouth., Disp: , Rfl:    rosuvastatin (CRESTOR) 20 MG tablet, Take 1 tablet (20 mg total) by mouth at bedtime., Disp: 90 tablet, Rfl: 3   Semaglutide (RYBELSUS) 7 MG TABS, Take 1 tablet by mouth daily with breakfast., Disp: 90 tablet, Rfl: 1  Allergies:  Allergies  Allergen Reactions   Codeine Nausea And Vomiting   Lisinopril Cough   Nsaids Other (See Comments)    CKD   Dapagliflozin Rash   Latex Hives    Past Medical History, Surgical history, Social history, and Family History were reviewed and updated.  Review of Systems: Review of Systems  Constitutional:  Positive for fatigue.  HENT:  Negative.    Eyes: Negative.   Respiratory: Negative.    Cardiovascular: Negative.   Gastrointestinal: Negative.   Endocrine: Negative.   Genitourinary: Negative.    Musculoskeletal: Negative.   Skin: Negative.   Neurological: Negative.   Hematological: Negative.   Psychiatric/Behavioral: Negative.      Physical Exam:  height is 5' 8" (1.727 m) and weight is 165 lb (74.8 kg). His oral temperature is 97.6 F (36.4 C). His blood pressure is 125/49 (abnormal) and his pulse is 60. His respiration is 17 and oxygen saturation is 99%.   Wt Readings from Last 3 Encounters:  05/20/22 165 lb (74.8 kg)  05/12/22 165 lb (74.8 kg)  05/04/22 160 lb (72.6 kg)    Physical Exam Vitals reviewed.  HENT:     Head: Normocephalic and atraumatic.  Eyes:     Pupils: Pupils are equal, round, and reactive to light.  Cardiovascular:     Rate and Rhythm: Normal rate and regular rhythm.     Heart sounds: Normal heart sounds.  Pulmonary:     Effort: Pulmonary effort is normal.     Breath sounds: Normal breath sounds.  Abdominal:     General: Bowel sounds are normal.     Palpations: Abdomen is soft.  Genitourinary:    Comments: In the left inguinal area, he does have a firm somewhat tender erythematous area.  It probably measures 3 cm x 1 cm.  There is a  tiny opening.  Again, there is purulent bloody exudate from this opening. Musculoskeletal:        General: No tenderness or deformity. Normal range of motion.     Cervical back: Normal range of motion.  Lymphadenopathy:     Cervical: No cervical adenopathy.  Skin:    General: Skin is warm and dry.     Findings: No erythema or rash.  Neurological:     Mental Status: He is alert and oriented to person, place,  and time.  Psychiatric:        Behavior: Behavior normal.        Thought Content: Thought content normal.        Judgment: Judgment normal.      Lab Results  Component Value Date   WBC 3.4 (L) 05/20/2022   HGB 11.8 (L) 05/20/2022   HCT 35.4 (L) 05/20/2022   MCV 91.2 05/20/2022   PLT 131 (L) 05/20/2022     Chemistry      Component Value Date/Time   NA 141 05/20/2022 0754   NA 140 08/31/2021 1026   K 4.6 05/20/2022 0754   CL 106 05/20/2022 0754   CO2 28 05/20/2022 0754   BUN 28 (H) 05/20/2022 0754   BUN 21 08/31/2021 1026   CREATININE 1.78 (H) 05/20/2022 0754      Component Value Date/Time   CALCIUM 9.2 05/20/2022 0754   ALKPHOS 51 05/20/2022 0754   AST 28 05/20/2022 0754   ALT 47 (H) 05/20/2022 0754   BILITOT 0.5 05/20/2022 0754      Impression and Plan: Mr. Golson is a very nice 76 year old white male.  He has lambda light chain myeloma.  He has mild renal insufficiency.    Again, he has an abscess.  We will send him over to Surgery.  We will hopefully be able to treat him in a couple weeks.  Given that he is done so well, I do not see a problem with delaying his treatment for a couple of weeks.  Will see him back in 2 weeks.    Volanda Napoleon, MD 11/30/20234:28 PM

## 2022-05-21 ENCOUNTER — Telehealth: Payer: Self-pay | Admitting: *Deleted

## 2022-05-21 ENCOUNTER — Encounter: Payer: Self-pay | Admitting: *Deleted

## 2022-05-21 MED ORDER — AMOXICILLIN-POT CLAVULANATE 875-125 MG PO TABS: 1.0000 | ORAL_TABLET | Freq: Two times a day (BID) | ORAL | 0 refills | Status: DC

## 2022-05-21 NOTE — Telephone Encounter (Signed)
Call placed to check on patient's status after visit with surgeon yesterday.  Pt states that he is "feeling better" today and that surgeon "lanced and packed" the abscess to his groin.  Dr. Marin Olp notified.

## 2022-05-21 NOTE — Telephone Encounter (Signed)
-----   Message from Volanda Napoleon, MD sent at 05/21/2022  3:55 PM EST ----- He is going to strep and is abscess.  He really needs to be on Augmentin 875 mg p.o. twice daily x 7 days.  Please call this in.  Thanks.  Laurey Arrow

## 2022-05-21 NOTE — Telephone Encounter (Signed)
Notified pt of results and new Rx. Pt verified Rx to be sent to [facility] Pt verbalized understanding and denied further concerns.

## 2022-05-22 DIAGNOSIS — I129 Hypertensive chronic kidney disease with stage 1 through stage 4 chronic kidney disease, or unspecified chronic kidney disease: Secondary | ICD-10-CM | POA: Diagnosis not present

## 2022-05-22 DIAGNOSIS — E1129 Type 2 diabetes mellitus with other diabetic kidney complication: Secondary | ICD-10-CM | POA: Diagnosis not present

## 2022-05-22 DIAGNOSIS — E1122 Type 2 diabetes mellitus with diabetic chronic kidney disease: Secondary | ICD-10-CM | POA: Diagnosis not present

## 2022-05-22 DIAGNOSIS — N189 Chronic kidney disease, unspecified: Secondary | ICD-10-CM | POA: Diagnosis not present

## 2022-05-22 DIAGNOSIS — R809 Proteinuria, unspecified: Secondary | ICD-10-CM | POA: Diagnosis not present

## 2022-05-22 DIAGNOSIS — C9000 Multiple myeloma not having achieved remission: Secondary | ICD-10-CM | POA: Diagnosis not present

## 2022-05-22 LAB — AEROBIC CULTURE W GRAM STAIN (SUPERFICIAL SPECIMEN)

## 2022-05-25 ENCOUNTER — Ambulatory Visit: Payer: Medicare Other | Attending: Cardiovascular Disease | Admitting: Cardiovascular Disease

## 2022-05-25 ENCOUNTER — Encounter: Payer: Self-pay | Admitting: Cardiovascular Disease

## 2022-05-25 VITALS — BP 124/72 | HR 58 | Ht 68.0 in | Wt 168.8 lb

## 2022-05-25 DIAGNOSIS — I739 Peripheral vascular disease, unspecified: Secondary | ICD-10-CM | POA: Diagnosis not present

## 2022-05-25 DIAGNOSIS — I4819 Other persistent atrial fibrillation: Secondary | ICD-10-CM

## 2022-05-25 DIAGNOSIS — E785 Hyperlipidemia, unspecified: Secondary | ICD-10-CM | POA: Diagnosis not present

## 2022-05-25 DIAGNOSIS — I2510 Atherosclerotic heart disease of native coronary artery without angina pectoris: Secondary | ICD-10-CM

## 2022-05-25 DIAGNOSIS — Z720 Tobacco use: Secondary | ICD-10-CM

## 2022-05-25 NOTE — Patient Instructions (Signed)
Medication Instructions:  No changes *If you need a refill on your cardiac medications before your next appointment, please call your pharmacy*   Lab Work: None ordered If you have labs (blood work) drawn today and your tests are completely normal, you will receive your results only by: [place] (if you have MyChart) OR A paper copy in the mail If you have any lab test that is abnormal or we need to change your treatment, we will call you to review the results.   Testing/Procedures: Your physician has requested that you have an Aorta/Iliac Duplex. This will be take place at [place], [address] in June 2024.  No food after 11PM the night before.  Water is OK. (Don't drink liquids if you have been instructed not to for ANOTHER test) Avoid foods that produce bowel gas, for 24 hours prior to exam (see below). No breakfast, no chewing gum, no smoking or carbonated beverages. Patient may take morning medications with water. Come in for test at least 15 minutes early to register.  Your physician has requested that you have an ankle brachial index (ABI) in June 2024. During this test an ultrasound and blood pressure cuff are used to evaluate the arteries that supply the arms and legs with blood. Allow thirty minutes for this exam. There are no restrictions or special instructions. This will take place at [place], [address].     Follow-Up: At [facility], you and your health needs are our priority.  As part of our continuing mission to provide you with exceptional heart care, we have created designated Provider Care Teams.  These Care Teams include your primary Cardiologist (physician) and Advanced Practice Providers (APPs -  Physician Assistants and Nurse Practitioners) who all work together to provide you with the care you need, when you need it.  We recommend signing up for the patient portal called "MyChart".  Sign up information is provided on this After  Visit Summary.  MyChart is used to connect with patients for Virtual Visits (Telemedicine).  Patients are able to view lab/test results, encounter notes, upcoming appointments, etc.  Non-urgent messages can be sent to your provider as well.   To learn more about what you can do with MyChart, go to [email or web address].    Your next appointment:   Follow up after your dopplers in June 2024

## 2022-05-25 NOTE — Progress Notes (Signed)
Cardiology Office Note   Date:  05/25/2022   ID:  Marvin Salinas, DOB 09-Jan-1946, MRN [id number]  PCP:  Ma Hillock, DO  Cardiologist:  Dr. Acie Fredrickson  No chief complaint on file.      History of Present Illness: Marvin Salinas is a 76 y.o. male who is here today for a follow-up visit regarding peripheral arterial disease.  He has known history of coronary artery disease status post CABG in 2001, paroxysmal atrial fibrillation on anticoagulation, essential hypertension, carotid disease, hyperlipidemia, diabetes and prior TIAs. Patient has known history of peripheral arterial disease .  He does have underlying chronic kidney disease with creatinine between 1.6-2.  The patient is followed for peripheral arterial disease with previous severe claudication.  Angiography in December 2020 showed heavily calcified subtotal occlusion of the left common iliac artery with moderate disease affecting the right common iliac artery.  I performed successful angioplasty and and balloon expandable stent placement to the left common iliac artery.   He has known history of low back pain and bilateral knee joint pain.    The patient was diagnosed with severe lumbar radiculopathy but did not improve significantly with steroid injections.   Most recent lower extremity Doppler in June showed noncompressible vessels with patent left common iliac artery stent with normal velocities and stable moderately elevated velocity in the right common iliac artery.  He is currently being treated for multiple myeloma with chronic kidney disease.  He reports worsening numbness in his left leg.  He gets pain in the lower back that goes all the way down to the foot.  He is trying to quit smoking completely.   Past Medical History:  Diagnosis Date   Acute kidney injury superimposed on chronic kidney disease ([place]) 10/12/2017   Atrial fibrillation with RVR ([place]) 10/27/2017   Back pain    Basilar artery stenosis    on  chronic Plavix   Benign prostatic hypertrophy without urinary obstruction 04/17/2014   Carotid artery disease ([place]) 10/06/2010   Carotid US 04/2019: Bilat ICA 40-59; L subclavian stenosis  // Carotid US 11/21: Bilat ICA 40-59; bilateral subclavian stenosis // Carotid US 11/22: Bilateral ICA 40-59; left subclavian stenosis // Carotid US 04/28/2022: Bilateral ICA 40-59; left subclavian stenosis   Cerebral vascular disease    with prior TIA's; followed by Dr. Erling Cruz   Depression, neurotic 11/21/2013   Diabetes mellitus    on insulin   Enthesopathy of ankle and tarsus 09/04/2007   Overview:  Metatarsalgia    Enthesopathy of ankle and tarsus 09/04/2007   Overview:  Metatarsalgia  Formatting of this note might be different from the original. Metatarsalgia  10/1 IMO update   History of renal calculi    Hyperlipidemia    Hypertension    Ischemic heart disease    prior PCI to RCA in 1989. S/P PCI to LAD and OM in 1992. S/P PCI to first DX in 2000. S/P CABG x 3 in May 2011   Lambda light chain myeloma ([place]) 10/28/2021   Left hip pain 01/03/2020   OSA (obstructive sleep apnea) 01/11/2018   AHI 18.1 and SaO2 low 73%   PAD (peripheral artery disease) ([place]) 11/18/2017   ABIs/Arterial US 04/2019: R 1.21; L 0.66 // R SFA 30-49, stable > 50 CIA and EIA stenosis; L > 50 CIA stenosis (likely represents severe stenosis or short segment occlusion)   Peripheral neuropathy    Pneumonia 02/2011   Sacroiliac joint dysfunction of left side 01/03/2020  Stroke ([place]) 04/16/2001   small right cerebellar infarct on 04/16/2001 at that time he was found to have proximal left vertebral artery, proximal left common carotid artery and both external carotid artery stenosis as well as intracranial stenosis involving mid basilar artery- 07/2011 add questionable TIA    Past Surgical History:  Procedure Laterality Date    NASAL ENDOSCOPY  01/11/2020   chronic rhinitis w/o evidence of acute sinusitis, bilateral inferior  turbinate hypertrophy   ABDOMINAL AORTOGRAM W/LOWER EXTREMITY Bilateral 05/30/2019   Procedure: ABDOMINAL AORTOGRAM W/LOWER EXTREMITY;  Surgeon: Wellington Hampshire, MD;  Location: [facility];  Service: Cardiovascular;  Laterality: Bilateral;   ANGIOPLASTY  1989   right coronary artery   ANGIOPLASTY  1992   LAD and OM   ANGIOPLASTY  1998   First DX   BRAIN SURGERY     on prior records   CORONARY ARTERY BYPASS GRAFT  11/12/2009   LIMA to LAD, SVG to OM and SVG to RCA   CORONARY STENT PLACEMENT  2000   Stent to LAD/Circumflex with angioplasty to first diagonal    LEFT HEART CATH AND CORS/GRAFTS ANGIOGRAPHY N/A 10/13/2017   Procedure: LEFT HEART CATH AND CORS/GRAFTS ANGIOGRAPHY;  Surgeon: Nelva Bush, MD;  Location: [facility];  Service: Cardiovascular;  Laterality: N/A;     Current Outpatient Medications  Medication Sig Dispense Refill   amoxicillin-clavulanate (AUGMENTIN) 875-125 MG tablet Take 1 tablet by mouth 2 (two) times daily. 14 tablet 0   BD INSULIN SYRINGE U/F 31G X 5/16" 0.5 ML MISC MONITOR BLOOD GLUCOSE 3 TIMES DAILY 100 each 4   blood glucose meter kit and supplies KIT Dispense based on patient and insurance preference. Use up to four times daily as directed. 1 each 0   Carboxymethylcell-Hypromellose (GENTEAL OP) Place 1 drop into both eyes daily as needed (dry eyes).     dofetilide (TIKOSYN) 125 MCG capsule TAKE 1 CAPSULE BY MOUTH TWICE A DAY 180 capsule 3   ELIQUIS 5 MG TABS tablet TAKE 1 TABLET BY MOUTH TWICE A DAY 180 tablet 1   ezetimibe (ZETIA) 10 MG tablet Take 1 tablet (10 mg total) by mouth daily. 90 tablet 3   famciclovir (FAMVIR) 250 MG tablet Take 1 tablet (250 mg total) by mouth daily. 30 tablet 12   fluticasone (FLONASE) 50 MCG/ACT nasal spray Place 2 sprays into both nostrils daily.     insulin glargine (LANTUS) 100 UNIT/ML injection 30 units subcu injection in the morning and 35 units subcu injection before bed. 30 mL 5   Lancets (ONETOUCH  DELICA PLUS [id number]) MISC USE UP TO FOUR TIMES DAILY AS DIRECTED **EMERGENCY FILL** 100 each 11   lenalidomide (REVLIMID) 10 MG capsule Take 1 capsule (10 mg total) by mouth daily. [employer] Auth #[id number] 21 capsule 0   loratadine (CLARITIN) 10 MG tablet Take 10 mg by mouth daily as needed for allergies.     methocarbamol (ROBAXIN) 500 MG tablet Take 500 mg by mouth daily.     metoprolol tartrate (LOPRESSOR) 25 MG tablet Take 0.5 tablets (12.5 mg total) by mouth 2 (two) times daily. 90 tablet 1   nitroGLYCERIN (NITROSTAT) 0.4 MG SL tablet PLACE 1 TABLET (0.4 MG TOTAL) UNDER THE TONGUE EVERY 5 (FIVE) MINUTES AS NEEDED FOR CHEST PAIN. 25 tablet 12   nitroGLYCERIN (NITROSTAT) 0.4 MG SL tablet Place 1 tablet under the tongue every 5 (five) minutes as needed.     olmesartan (BENICAR) 40 MG tablet Take 20 mg  by mouth 2 (two) times daily.     ondansetron (ZOFRAN) 8 MG tablet Take 1 tablet (8 mg total) by mouth every 8 (eight) hours as needed for nausea or vomiting. 30 tablet 1   ONETOUCH VERIO test strip USE UP TO FOUR TIMES DAILY AS DIRECTED. 100 strip 3   polyethylene glycol powder (GLYCOLAX/MIRALAX) 17 GM/SCOOP powder Take by mouth.     rosuvastatin (CRESTOR) 20 MG tablet Take 1 tablet (20 mg total) by mouth at bedtime. 90 tablet 3   Semaglutide (RYBELSUS) 7 MG TABS Take 1 tablet by mouth daily with breakfast. 90 tablet 1   No current facility-administered medications for this visit.    Allergies:   Codeine, Lisinopril, Nsaids, Dapagliflozin, and Latex    Social History:  The patient  reports that he quit smoking about 7 months ago. His smoking use included cigarettes. He has a 13.50 pack-year smoking history. He has never used smokeless tobacco. He reports that he does not drink alcohol and does not use drugs.   Family History:  The patient's family history includes Asthma in his brother; Depression in his mother; Diabetes in his brother and sister; Early death in his mother; Heart attack in his  father; Heart disease in his father; Hypertension in his father; Kidney disease in his mother; Ovarian cancer in his mother; Stroke in an other family member.    ROS:  Please see the history of present illness.   Otherwise, review of systems are positive for none.   All other systems are reviewed and negative.    PHYSICAL EXAM: VS:  BP 124/72   Pulse (!) 58   Ht _0  (1.727 m)   Wt 168 lb 12.8 oz (76.6 kg)   SpO2 98%   BMI 25.67 kg/m  , BMI Body mass index is 25.67 kg/m. GEN: Well nourished, well developed, in no acute distress  HEENT: normal  Neck: no JVD,  or masses .  Bilateral carotid bruits. Cardiac: RRR; no murmurs, rubs, or gallops,no edema  Respiratory:  clear to auscultation bilaterally, normal work of breathing GI: soft, nontender, nondistended, + BS MS: no deformity or atrophy  Skin: warm and dry, no rash Neuro:  Strength and sensation are intact Psych: euthymic mood, full affect  Vascular: Femoral pulses are slightly diminished bilaterally.  Distal pulses are not palpable.  EKG:  EKG is ordered today. EKG showed sinus bradycardia with a PVC.  Nonspecific lateral T wave changes.  Normal QT interval.  Recent Labs: 10/28/2021: TSH 1.19 05/20/2022: ALT 47; BUN 28; Creatinine 1.78; Hemoglobin 11.8; Platelet Count 131; Potassium 4.6; Sodium 141    Lipid Panel    Component Value Date/Time   CHOL 130 08/31/2021 1026   TRIG 129 08/31/2021 1026   HDL 51 08/31/2021 1026   CHOLHDL 2.5 08/31/2021 1026   CHOLHDL 3 10/23/2020 1112   VLDL 25.0 10/23/2020 1112   LDLCALC 56 08/31/2021 1026      Wt Readings from Last 3 Encounters:  05/25/22 168 lb 12.8 oz (76.6 kg)  05/20/22 165 lb (74.8 kg)  05/12/22 165 lb (74.8 kg)           11/30/2016    7:59 AM  PAD Screen  Previous PAD dx? Yes  Previous surgical procedure? No  Pain with walking? Yes  Subsides with rest? No  Feet/toe relief with dangling? Yes  Painful, non-healing ulcers? No  Extremities discolored? No       ASSESSMENT AND PLAN:  1.  Peripheral arterial disease:  Status post  stent placement to the left common iliac artery for subtotal occlusion.  I suspect that his left leg numbness and pain and mostly neuropathic in origin.  This has worsened since starting chemotherapy for multiple myeloma.  His femoral pulses are still stable.   Repeat Doppler studies in June.  2. Coronary artery disease without angina: Continue medical therapy.  He is not on antiplatelet therapy given that he is on anticoagulation with Eliquis.  3. Tobacco use: He is trying to quit completely.  4. Carotid disease: Most recent carotid Doppler showed stable moderate bilateral disease.  Follow-up study in 1 year.  5. Hyperlipidemia: Most recent lipid profile showed an LDL of 56.  Continue rosuvastatin and Zetia.  6.  Persistent atrial fibrillation: Maintaining in sinus rhythm with dofetilide.  On long-term anticoagulation with Eliquis.   Disposition:   FU with me in 6 month.   Signed,  Kathlyn Sacramento, MD  05/25/2022 8:26 AM    [place]

## 2022-05-26 DIAGNOSIS — E1122 Type 2 diabetes mellitus with diabetic chronic kidney disease: Secondary | ICD-10-CM | POA: Diagnosis not present

## 2022-05-26 DIAGNOSIS — R809 Proteinuria, unspecified: Secondary | ICD-10-CM | POA: Diagnosis not present

## 2022-05-26 DIAGNOSIS — I129 Hypertensive chronic kidney disease with stage 1 through stage 4 chronic kidney disease, or unspecified chronic kidney disease: Secondary | ICD-10-CM | POA: Diagnosis not present

## 2022-05-26 DIAGNOSIS — E1129 Type 2 diabetes mellitus with other diabetic kidney complication: Secondary | ICD-10-CM | POA: Diagnosis not present

## 2022-05-26 DIAGNOSIS — N189 Chronic kidney disease, unspecified: Secondary | ICD-10-CM | POA: Diagnosis not present

## 2022-06-01 ENCOUNTER — Other Ambulatory Visit: Payer: Self-pay | Admitting: Family Medicine

## 2022-06-07 ENCOUNTER — Encounter: Payer: Self-pay | Admitting: Hematology & Oncology

## 2022-06-07 ENCOUNTER — Inpatient Hospital Stay: Payer: Medicare Other

## 2022-06-07 ENCOUNTER — Inpatient Hospital Stay (HOSPITAL_BASED_OUTPATIENT_CLINIC_OR_DEPARTMENT_OTHER): Payer: Medicare Other | Admitting: Hematology & Oncology

## 2022-06-07 ENCOUNTER — Other Ambulatory Visit: Payer: Self-pay

## 2022-06-07 ENCOUNTER — Inpatient Hospital Stay: Payer: Medicare Other | Attending: Family

## 2022-06-07 VITALS — BP 136/54 | HR 51 | Temp 97.7°F | Resp 18 | Ht 68.0 in | Wt 168.0 lb

## 2022-06-07 DIAGNOSIS — C9000 Multiple myeloma not having achieved remission: Secondary | ICD-10-CM | POA: Diagnosis not present

## 2022-06-07 DIAGNOSIS — Z5112 Encounter for antineoplastic immunotherapy: Secondary | ICD-10-CM | POA: Diagnosis not present

## 2022-06-07 DIAGNOSIS — L02214 Cutaneous abscess of groin: Secondary | ICD-10-CM

## 2022-06-07 LAB — CBC WITH DIFFERENTIAL (CANCER CENTER ONLY)
Abs Immature Granulocytes: 0 10*3/uL (ref 0.00–0.07)
Basophils Absolute: 0.1 10*3/uL (ref 0.0–0.1)
Basophils Relative: 4 %
Eosinophils Absolute: 0.2 10*3/uL (ref 0.0–0.5)
Eosinophils Relative: 9 %
HCT: 33.5 % — ABNORMAL LOW (ref 39.0–52.0)
Hemoglobin: 11.3 g/dL — ABNORMAL LOW (ref 13.0–17.0)
Immature Granulocytes: 0 %
Lymphocytes Relative: 29 %
Lymphs Abs: 0.8 10*3/uL (ref 0.7–4.0)
MCH: 30.6 pg (ref 26.0–34.0)
MCHC: 33.7 g/dL (ref 30.0–36.0)
MCV: 90.8 fL (ref 80.0–100.0)
Monocytes Absolute: 0.4 10*3/uL (ref 0.1–1.0)
Monocytes Relative: 16 %
Neutro Abs: 1.1 10*3/uL — ABNORMAL LOW (ref 1.7–7.7)
Neutrophils Relative %: 42 %
Platelet Count: 101 10*3/uL — ABNORMAL LOW (ref 150–400)
RBC: 3.69 MIL/uL — ABNORMAL LOW (ref 4.22–5.81)
RDW: 15.9 % — ABNORMAL HIGH (ref 11.5–15.5)
WBC Count: 2.7 10*3/uL — ABNORMAL LOW (ref 4.0–10.5)
nRBC: 0 % (ref 0.0–0.2)

## 2022-06-07 LAB — CMP (CANCER CENTER ONLY)
ALT: 23 U/L (ref 0–44)
AST: 19 U/L (ref 15–41)
Albumin: 4.1 g/dL (ref 3.5–5.0)
Alkaline Phosphatase: 42 U/L (ref 38–126)
Anion gap: 8 (ref 5–15)
BUN: 21 mg/dL (ref 8–23)
CO2: 29 mmol/L (ref 22–32)
Calcium: 8.9 mg/dL (ref 8.9–10.3)
Chloride: 104 mmol/L (ref 98–111)
Creatinine: 1.51 mg/dL — ABNORMAL HIGH (ref 0.61–1.24)
GFR, Estimated: 47 mL/min — ABNORMAL LOW (ref >60)
Glucose, Bld: 177 mg/dL — ABNORMAL HIGH (ref 70–99)
Potassium: 3.9 mmol/L (ref 3.5–5.1)
Sodium: 141 mmol/L (ref 135–145)
Total Bilirubin: 0.7 mg/dL (ref 0.3–1.2)
Total Protein: 5.9 g/dL — ABNORMAL LOW (ref 6.5–8.1)

## 2022-06-07 LAB — LACTATE DEHYDROGENASE: LDH: 188 U/L (ref 98–192)

## 2022-06-07 MED ORDER — DEXAMETHASONE 4 MG PO TABS
4.0000 mg | ORAL_TABLET | Freq: Once | ORAL | Status: AC
  Administered 2022-06-07: 4 mg via ORAL
  Filled 2022-06-07: qty 1

## 2022-06-07 MED ORDER — LORAZEPAM 1 MG PO TABS
0.5000 mg | ORAL_TABLET | Freq: Once | ORAL | Status: AC
  Administered 2022-06-07: 0.5 mg via ORAL
  Filled 2022-06-07: qty 1

## 2022-06-07 MED ORDER — BORTEZOMIB CHEMO SQ INJECTION 3.5 MG (2.5MG/ML)
1.3000 mg/m2 | Freq: Once | INTRAMUSCULAR | Status: AC
  Administered 2022-06-07: 2.5 mg via SUBCUTANEOUS
  Filled 2022-06-07: qty 1

## 2022-06-07 NOTE — Progress Notes (Signed)
Hematology and Oncology Follow Up Visit  Marvin Salinas [id number] May 08, 1946 76 y.o. 06/07/2022   Principle Diagnosis:  Lambda light chain myeloma --normal cytogenetics  Current Therapy:   Faspro/Velcade/Revlimid -- s/p cycle #6 -- start on 11/05/2021     Interim History:  Marvin Salinas is back for follow-up.  He is doing much better.  Last time that we saw him, he had a abscess in the left groin.  He ultimately went to surgery to have this opened up.  He was on antibiotics.  This is healed up nicely.  He is looking forward to a nice Christmas.  It sounds like he will have some friends come over.  He has done well with treatment.  He has responded as I thought he would.  His last lambda light chain was 3.8 mg/dL.  He has had no change in bowel or bladder habits.  He has had no problems with the Revlimid.  He has had no cough or shortness of breath.  He has had no rashes.  There is been no leg swelling.  He has had no headache.  He does have diabetes.  His blood sugars are on the high side.  Marvin Salinas also has neuropathy.  For some reason, is not taking his gabapentin.  I do not see it on his med list.  We will have to go ahead and give this to him.  I think this is helped his neuropathy.  Overall, I would say that his performance status is probably ECOG 1.    Medications:  Current Outpatient Medications:    BD INSULIN SYRINGE U/F 31G X 5/16" 0.5 ML MISC, MONITOR BLOOD GLUCOSE 3 TIMES DAILY, Disp: 100 each, Rfl: 4   blood glucose meter kit and supplies KIT, Dispense based on patient and insurance preference. Use up to four times daily as directed., Disp: 1 each, Rfl: 0   Carboxymethylcell-Hypromellose (GENTEAL OP), Place 1 drop into both eyes daily as needed (dry eyes)., Disp: , Rfl:    dofetilide (TIKOSYN) 125 MCG capsule, TAKE 1 CAPSULE BY MOUTH TWICE A DAY, Disp: 180 capsule, Rfl: 3   ELIQUIS 5 MG TABS tablet, TAKE 1 TABLET BY MOUTH TWICE A DAY, Disp: 180 tablet, Rfl: 1   ezetimibe  (ZETIA) 10 MG tablet, Take 1 tablet (10 mg total) by mouth daily., Disp: 90 tablet, Rfl: 3   famciclovir (FAMVIR) 250 MG tablet, Take 1 tablet (250 mg total) by mouth daily., Disp: 30 tablet, Rfl: 12   fluticasone (FLONASE) 50 MCG/ACT nasal spray, Place 2 sprays into both nostrils daily., Disp: , Rfl:    insulin glargine (LANTUS) 100 UNIT/ML injection, 30 units subcu injection in the morning and 35 units subcu injection before bed., Disp: 30 mL, Rfl: 5   Lancets (ONETOUCH DELICA PLUS [id number]) MISC, USE UP TO FOUR TIMES DAILY AS DIRECTED **EMERGENCY FILL**, Disp: 100 each, Rfl: 11   lenalidomide (REVLIMID) 10 MG capsule, Take 1 capsule (10 mg total) by mouth daily. [employer] Josem Kaufmann #[id number], Disp: 21 capsule, Rfl: 0   loratadine (CLARITIN) 10 MG tablet, Take 10 mg by mouth daily as needed for allergies., Disp: , Rfl:    methocarbamol (ROBAXIN) 500 MG tablet, Take 500 mg by mouth daily., Disp: , Rfl:    metoprolol tartrate (LOPRESSOR) 25 MG tablet, Take 0.5 tablets (12.5 mg total) by mouth 2 (two) times daily., Disp: 90 tablet, Rfl: 1   nitroGLYCERIN (NITROSTAT) 0.4 MG SL tablet, PLACE 1 TABLET (0.4 MG TOTAL) UNDER THE TONGUE EVERY 5 (  FIVE) MINUTES AS NEEDED FOR CHEST PAIN., Disp: 25 tablet, Rfl: 12   nitroGLYCERIN (NITROSTAT) 0.4 MG SL tablet, Place 1 tablet under the tongue every 5 (five) minutes as needed., Disp: , Rfl:    olmesartan (BENICAR) 40 MG tablet, Take 20 mg by mouth 2 (two) times daily., Disp: , Rfl:    ondansetron (ZOFRAN) 8 MG tablet, Take 1 tablet (8 mg total) by mouth every 8 (eight) hours as needed for nausea or vomiting., Disp: 30 tablet, Rfl: 1   ONETOUCH VERIO test strip, USE UP TO FOUR TIMES DAILY AS DIRECTED., Disp: 100 strip, Rfl: 3   polyethylene glycol powder (GLYCOLAX/MIRALAX) 17 GM/SCOOP powder, Take by mouth., Disp: , Rfl:    rosuvastatin (CRESTOR) 20 MG tablet, Take 1 tablet (20 mg total) by mouth at bedtime., Disp: 90 tablet, Rfl: 3   Semaglutide (RYBELSUS) 7 MG TABS,  TAKE 1 TABLET BY MOUTH EVERY DAY WITH BREAKFAST, Disp: 30 tablet, Rfl: 0  Allergies:  Allergies  Allergen Reactions   Codeine Nausea And Vomiting   Lisinopril Cough   Nsaids Other (See Comments)    CKD   Dapagliflozin Rash   Latex Hives    Past Medical History, Surgical history, Social history, and Family History were reviewed and updated.  Review of Systems: Review of Systems  Constitutional:  Positive for fatigue.  HENT:  Negative.    Eyes: Negative.   Respiratory: Negative.    Cardiovascular: Negative.   Gastrointestinal: Negative.   Endocrine: Negative.   Genitourinary: Negative.    Musculoskeletal: Negative.   Skin: Negative.   Neurological: Negative.   Hematological: Negative.   Psychiatric/Behavioral: Negative.      Physical Exam:  height is _0  (1.727 m) and weight is 168 lb (76.2 kg). His oral temperature is 97.7 F (36.5 C). His blood pressure is 136/54 (abnormal) and his pulse is 51 (abnormal). His respiration is 18 and oxygen saturation is 98%.   Wt Readings from Last 3 Encounters:  06/07/22 168 lb (76.2 kg)  05/25/22 168 lb 12.8 oz (76.6 kg)  05/20/22 165 lb (74.8 kg)    Physical Exam Vitals reviewed.  HENT:     Head: Normocephalic and atraumatic.  Eyes:     Pupils: Pupils are equal, round, and reactive to light.  Cardiovascular:     Rate and Rhythm: Normal rate and regular rhythm.     Heart sounds: Normal heart sounds.  Pulmonary:     Effort: Pulmonary effort is normal.     Breath sounds: Normal breath sounds.  Abdominal:     General: Bowel sounds are normal.     Palpations: Abdomen is soft.  Genitourinary:    Comments: In the left inguinal area, he has the healing abscess wound.  This is nontender.  It is still slightly erythematous but not warm. Musculoskeletal:        General: No tenderness or deformity. Normal range of motion.     Cervical back: Normal range of motion.  Lymphadenopathy:     Cervical: No cervical adenopathy.  Skin:     General: Skin is warm and dry.     Findings: No erythema or rash.  Neurological:     Mental Status: He is alert and oriented to person, place, and time.  Psychiatric:        Behavior: Behavior normal.        Thought Content: Thought content normal.        Judgment: Judgment normal.      Lab Results  Component Value Date   WBC 2.7 (L) 06/07/2022   HGB 11.3 (L) 06/07/2022   HCT 33.5 (L) 06/07/2022   MCV 90.8 06/07/2022   PLT 101 (L) 06/07/2022     Chemistry      Component Value Date/Time   NA 141 06/07/2022 0759   NA 140 08/31/2021 1026   K 3.9 06/07/2022 0759   CL 104 06/07/2022 0759   CO2 29 06/07/2022 0759   BUN 21 06/07/2022 0759   BUN 21 08/31/2021 1026   CREATININE 1.51 (H) 06/07/2022 0759      Component Value Date/Time   CALCIUM 8.9 06/07/2022 0759   ALKPHOS 42 06/07/2022 0759   AST 19 06/07/2022 0759   ALT 23 06/07/2022 0759   BILITOT 0.7 06/07/2022 0759      Impression and Plan: Mr. Bertran is a very nice 76 year old white male.  He has lambda light chain myeloma.  He has mild renal insufficiency.  Renal insufficiency is getting better.  His lambda light chains are quite low.  I hate that he had this abscess.  We got this taken care of.  I am so glad that Surgery was able to see him and take care of this quickly.  We will get him back on treatment.  He will receive day 8 of treatment today.  We will now have him come back after the New Year.  If he continues to do well, maybe we can start spreading his treatments out a little bit longer.  I would like to get a 24-hour urine on him next time that he comes in so we see how his urinary lambda light chain excretion is.   Volanda Napoleon, MD 12/18/20238:41 AM

## 2022-06-07 NOTE — Patient Instructions (Signed)
[facility]  Discharge Instructions: Thank you for choosing [facility] to provide your oncology and hematology care.   If you have a lab appointment with the [facility], please go directly to the [facility] and check in at the registration area.  Wear comfortable clothing and clothing appropriate for easy access to any Portacath or PICC line.   We strive to give you quality time with your provider. You may need to reschedule your appointment if you arrive late (15 or more minutes).  Arriving late affects you and other patients whose appointments are after yours.  Also, if you miss three or more appointments without notifying the office, you may be dismissed from the clinic at the provider's discretion.      For prescription refill requests, have your pharmacy contact our office and allow 72 hours for refills to be completed.    Today you received the following chemotherapy and/or immunotherapy agents velcade    To help prevent nausea and vomiting after your treatment, we encourage you to take your nausea medication as directed.  BELOW ARE SYMPTOMS THAT SHOULD BE REPORTED IMMEDIATELY: *FEVER GREATER THAN 100.4 F (38 C) OR HIGHER *CHILLS OR SWEATING *NAUSEA AND VOMITING THAT IS NOT CONTROLLED WITH YOUR NAUSEA MEDICATION *UNUSUAL SHORTNESS OF BREATH *UNUSUAL BRUISING OR BLEEDING *URINARY PROBLEMS (pain or burning when urinating, or frequent urination) *BOWEL PROBLEMS (unusual diarrhea, constipation, pain near the anus) TENDERNESS IN MOUTH AND THROAT WITH OR WITHOUT PRESENCE OF ULCERS (sore throat, sores in mouth, or a toothache) UNUSUAL RASH, SWELLING OR PAIN  UNUSUAL VAGINAL DISCHARGE OR ITCHING   Items with * indicate a potential emergency and should be followed up as soon as possible or go to the Emergency Department if any problems should occur.  Please show the CHEMOTHERAPY ALERT CARD or IMMUNOTHERAPY ALERT CARD at check-in to the  Emergency Department and triage nurse. Should you have questions after your visit or need to cancel or reschedule your appointment, please contact [facility]  [phone] and follow the prompts.  Office hours are 8:00 a.m. to 4:30 p.m. Monday - Friday. Please note that voicemails left after 4:00 p.m. may not be returned until the following business day.  We are closed weekends and major holidays. You have access to a nurse at all times for urgent questions. Please call the main number to the clinic [phone] and follow the prompts.  For any non-urgent questions, you may also contact your provider using MyChart. We now offer e-Visits for anyone 57 and older to request care online for non-urgent symptoms. For details visit [email or web address].   Also download the MyChart app! Go to the app store, search "MyChart", open the app, select , and log in with your MyChart username and password.  Masks are optional in the cancer centers. If you would like for your care team to wear a mask while they are taking care of you, please let them know. You may have one support person who is at least 77 years old accompany you for your appointments.

## 2022-06-07 NOTE — Progress Notes (Signed)
OK to treat with creat-1.51, WBC-2.7 and ANC-1.1 per order of Dr. Marin Olp.

## 2022-06-08 LAB — IGG, IGA, IGM
IgA: 51 mg/dL — ABNORMAL LOW (ref 61–437)
IgG (Immunoglobin G), Serum: 305 mg/dL — ABNORMAL LOW (ref 603–1613)
IgM (Immunoglobulin M), Srm: 13 mg/dL — ABNORMAL LOW (ref 15–143)

## 2022-06-09 ENCOUNTER — Ambulatory Visit (INDEPENDENT_AMBULATORY_CARE_PROVIDER_SITE_OTHER): Payer: Medicare Other

## 2022-06-09 DIAGNOSIS — Z23 Encounter for immunization: Secondary | ICD-10-CM | POA: Diagnosis not present

## 2022-06-09 LAB — PROTEIN ELECTROPHORESIS, SERUM, WITH REFLEX
A/G Ratio: 1.7 (ref 0.7–1.7)
Albumin ELP: 3.4 g/dL (ref 2.9–4.4)
Alpha-1-Globulin: 0.2 g/dL (ref 0.0–0.4)
Alpha-2-Globulin: 0.7 g/dL (ref 0.4–1.0)
Beta Globulin: 0.8 g/dL (ref 0.7–1.3)
Gamma Globulin: 0.3 g/dL — ABNORMAL LOW (ref 0.4–1.8)
Globulin, Total: 2 g/dL — ABNORMAL LOW (ref 2.2–3.9)
Total Protein ELP: 5.4 g/dL — ABNORMAL LOW (ref 6.0–8.5)

## 2022-06-10 ENCOUNTER — Other Ambulatory Visit: Payer: Self-pay | Admitting: *Deleted

## 2022-06-10 DIAGNOSIS — C9000 Multiple myeloma not having achieved remission: Secondary | ICD-10-CM

## 2022-06-10 LAB — KAPPA/LAMBDA LIGHT CHAINS
Kappa free light chain: 34.1 mg/L — ABNORMAL HIGH (ref 3.3–19.4)
Kappa, lambda light chain ratio: 0.96 (ref 0.26–1.65)
Lambda free light chains: 35.6 mg/L — ABNORMAL HIGH (ref 5.7–26.3)

## 2022-06-10 MED ORDER — LENALIDOMIDE 10 MG PO CAPS: 10.0000 mg | ORAL_CAPSULE | Freq: Every day | ORAL | 0 refills | Status: DC

## 2022-06-16 DIAGNOSIS — M5432 Sciatica, left side: Secondary | ICD-10-CM | POA: Diagnosis not present

## 2022-06-16 DIAGNOSIS — M5451 Vertebrogenic low back pain: Secondary | ICD-10-CM | POA: Diagnosis not present

## 2022-06-16 DIAGNOSIS — M5416 Radiculopathy, lumbar region: Secondary | ICD-10-CM | POA: Diagnosis not present

## 2022-06-17 ENCOUNTER — Encounter: Payer: Self-pay | Admitting: Family Medicine

## 2022-06-24 ENCOUNTER — Inpatient Hospital Stay (HOSPITAL_BASED_OUTPATIENT_CLINIC_OR_DEPARTMENT_OTHER): Payer: 59 | Admitting: Medical Oncology

## 2022-06-24 ENCOUNTER — Other Ambulatory Visit: Payer: Self-pay | Admitting: Hematology & Oncology

## 2022-06-24 ENCOUNTER — Inpatient Hospital Stay: Payer: 59 | Attending: Family

## 2022-06-24 ENCOUNTER — Encounter: Payer: Self-pay | Admitting: Medical Oncology

## 2022-06-24 ENCOUNTER — Inpatient Hospital Stay: Payer: 59

## 2022-06-24 VITALS — BP 138/65 | HR 56 | Temp 97.7°F | Resp 18 | Wt 162.8 lb

## 2022-06-24 VITALS — BP 124/68 | HR 85 | Temp 97.8°F | Resp 17

## 2022-06-24 DIAGNOSIS — D500 Iron deficiency anemia secondary to blood loss (chronic): Secondary | ICD-10-CM

## 2022-06-24 DIAGNOSIS — C9000 Multiple myeloma not having achieved remission: Secondary | ICD-10-CM

## 2022-06-24 DIAGNOSIS — Z5112 Encounter for antineoplastic immunotherapy: Secondary | ICD-10-CM | POA: Insufficient documentation

## 2022-06-24 DIAGNOSIS — Z79899 Other long term (current) drug therapy: Secondary | ICD-10-CM | POA: Insufficient documentation

## 2022-06-24 DIAGNOSIS — L02214 Cutaneous abscess of groin: Secondary | ICD-10-CM | POA: Diagnosis not present

## 2022-06-24 LAB — CBC WITH DIFFERENTIAL (CANCER CENTER ONLY)
Abs Immature Granulocytes: 0.01 10*3/uL (ref 0.00–0.07)
Basophils Absolute: 0.1 10*3/uL (ref 0.0–0.1)
Basophils Relative: 3 %
Eosinophils Absolute: 0.2 10*3/uL (ref 0.0–0.5)
Eosinophils Relative: 5 %
HCT: 37.9 % — ABNORMAL LOW (ref 39.0–52.0)
Hemoglobin: 12.6 g/dL — ABNORMAL LOW (ref 13.0–17.0)
Immature Granulocytes: 0 %
Lymphocytes Relative: 21 %
Lymphs Abs: 0.8 10*3/uL (ref 0.7–4.0)
MCH: 30.1 pg (ref 26.0–34.0)
MCHC: 33.2 g/dL (ref 30.0–36.0)
MCV: 90.7 fL (ref 80.0–100.0)
Monocytes Absolute: 0.3 10*3/uL (ref 0.1–1.0)
Monocytes Relative: 7 %
Neutro Abs: 2.4 10*3/uL (ref 1.7–7.7)
Neutrophils Relative %: 64 %
Platelet Count: 165 10*3/uL (ref 150–400)
RBC: 4.18 MIL/uL — ABNORMAL LOW (ref 4.22–5.81)
RDW: 15.5 % (ref 11.5–15.5)
WBC Count: 3.7 10*3/uL — ABNORMAL LOW (ref 4.0–10.5)
nRBC: 0 % (ref 0.0–0.2)

## 2022-06-24 LAB — LACTATE DEHYDROGENASE: LDH: 190 U/L (ref 98–192)

## 2022-06-24 LAB — CMP (CANCER CENTER ONLY)
ALT: 25 U/L (ref 0–44)
AST: 28 U/L (ref 15–41)
Albumin: 4.4 g/dL (ref 3.5–5.0)
Alkaline Phosphatase: 47 U/L (ref 38–126)
Anion gap: 8 (ref 5–15)
BUN: 20 mg/dL (ref 8–23)
CO2: 27 mmol/L (ref 22–32)
Calcium: 9.3 mg/dL (ref 8.9–10.3)
Chloride: 105 mmol/L (ref 98–111)
Creatinine: 1.52 mg/dL — ABNORMAL HIGH (ref 0.61–1.24)
GFR, Estimated: 47 mL/min — ABNORMAL LOW (ref >60)
Glucose, Bld: 85 mg/dL (ref 70–99)
Potassium: 4.1 mmol/L (ref 3.5–5.1)
Sodium: 140 mmol/L (ref 135–145)
Total Bilirubin: 0.8 mg/dL (ref 0.3–1.2)
Total Protein: 6.6 g/dL (ref 6.5–8.1)

## 2022-06-24 MED ORDER — LORAZEPAM 1 MG PO TABS
0.5000 mg | ORAL_TABLET | Freq: Once | ORAL | Status: AC
  Administered 2022-06-24: 0.5 mg via ORAL
  Filled 2022-06-24: qty 1

## 2022-06-24 MED ORDER — DIPHENHYDRAMINE HCL 25 MG PO CAPS
50.0000 mg | ORAL_CAPSULE | Freq: Once | ORAL | Status: AC
  Administered 2022-06-24: 50 mg via ORAL
  Filled 2022-06-24: qty 2

## 2022-06-24 MED ORDER — ACETAMINOPHEN 325 MG PO TABS
650.0000 mg | ORAL_TABLET | Freq: Once | ORAL | Status: AC
  Administered 2022-06-24: 650 mg via ORAL
  Filled 2022-06-24: qty 2

## 2022-06-24 MED ORDER — DEXAMETHASONE 4 MG PO TABS
20.0000 mg | ORAL_TABLET | Freq: Once | ORAL | Status: AC
  Administered 2022-06-24: 20 mg via ORAL
  Filled 2022-06-24: qty 5

## 2022-06-24 MED ORDER — DARATUMUMAB-HYALURONIDASE-FIHJ 1800-30000 MG-UT/15ML ~~LOC~~ SOLN
1800.0000 mg | Freq: Once | SUBCUTANEOUS | Status: AC
  Administered 2022-06-24: 1800 mg via SUBCUTANEOUS
  Filled 2022-06-24: qty 15

## 2022-06-24 MED ORDER — BORTEZOMIB CHEMO SQ INJECTION 3.5 MG (2.5MG/ML)
1.3000 mg/m2 | Freq: Once | INTRAMUSCULAR | Status: AC
  Administered 2022-06-24: 2.5 mg via SUBCUTANEOUS
  Filled 2022-06-24: qty 1

## 2022-06-24 NOTE — Progress Notes (Signed)
Hematology and Oncology Follow Up Visit  Marvin Salinas [id number] 1945-07-01 77 y.o. 06/24/2022   Principle Diagnosis:  Lambda light chain myeloma --normal cytogenetics  Current Therapy:   Faspro/Velcade/Revlimid -- s/p cycle #10 -- start on 11/05/2021- monthly      Interim History:  Marvin Salinas is back for follow-up.   Left groin abscess has fully resolved. He is feeling well today without fevers, cold symptoms, cough, SOB. No leg swelling, headache, rash. Weight down slightly but he reports he is eating/drinking well.    Responded well to his treatment and is hopeful for treatment today. His last lambda light chain was 3.56 mg/dL.  Wt Readings from Last 3 Encounters:  06/24/22 162 lb 12.8 oz (73.8 kg)  06/07/22 168 lb (76.2 kg)  05/25/22 168 lb 12.8 oz (76.6 kg)     Overall, I would say that his performance status is probably ECOG 1.    Medications:  Current Outpatient Medications:    BD INSULIN SYRINGE U/F 31G X 5/16" 0.5 ML MISC, MONITOR BLOOD GLUCOSE 3 TIMES DAILY, Disp: 100 each, Rfl: 4   blood glucose meter kit and supplies KIT, Dispense based on patient and insurance preference. Use up to four times daily as directed., Disp: 1 each, Rfl: 0   Carboxymethylcell-Hypromellose (GENTEAL OP), Place 1 drop into both eyes daily as needed (dry eyes)., Disp: , Rfl:    dofetilide (TIKOSYN) 125 MCG capsule, TAKE 1 CAPSULE BY MOUTH TWICE A DAY, Disp: 180 capsule, Rfl: 3   ELIQUIS 5 MG TABS tablet, TAKE 1 TABLET BY MOUTH TWICE A DAY, Disp: 180 tablet, Rfl: 1   ezetimibe (ZETIA) 10 MG tablet, Take 1 tablet (10 mg total) by mouth daily., Disp: 90 tablet, Rfl: 3   famciclovir (FAMVIR) 250 MG tablet, Take 1 tablet (250 mg total) by mouth daily., Disp: 30 tablet, Rfl: 12   fluticasone (FLONASE) 50 MCG/ACT nasal spray, Place 2 sprays into both nostrils daily., Disp: , Rfl:    insulin glargine (LANTUS) 100 UNIT/ML injection, 30 units subcu injection in the morning and 35 units subcu injection  before bed., Disp: 30 mL, Rfl: 5   Lancets (ONETOUCH DELICA PLUS [id number]) MISC, USE UP TO FOUR TIMES DAILY AS DIRECTED **EMERGENCY FILL**, Disp: 100 each, Rfl: 11   lenalidomide (REVLIMID) 10 MG capsule, Take 1 capsule (10 mg total) by mouth daily. [employer] Josem Kaufmann #[id number], Disp: 21 capsule, Rfl: 0   loratadine (CLARITIN) 10 MG tablet, Take 10 mg by mouth daily as needed for allergies., Disp: , Rfl:    methocarbamol (ROBAXIN) 500 MG tablet, Take 500 mg by mouth daily., Disp: , Rfl:    metoprolol tartrate (LOPRESSOR) 25 MG tablet, Take 0.5 tablets (12.5 mg total) by mouth 2 (two) times daily., Disp: 90 tablet, Rfl: 1   nitroGLYCERIN (NITROSTAT) 0.4 MG SL tablet, PLACE 1 TABLET (0.4 MG TOTAL) UNDER THE TONGUE EVERY 5 (FIVE) MINUTES AS NEEDED FOR CHEST PAIN., Disp: 25 tablet, Rfl: 12   nitroGLYCERIN (NITROSTAT) 0.4 MG SL tablet, Place 1 tablet under the tongue every 5 (five) minutes as needed., Disp: , Rfl:    olmesartan (BENICAR) 40 MG tablet, Take 20 mg by mouth 2 (two) times daily., Disp: , Rfl:    ondansetron (ZOFRAN) 8 MG tablet, Take 1 tablet (8 mg total) by mouth every 8 (eight) hours as needed for nausea or vomiting., Disp: 30 tablet, Rfl: 1   ONETOUCH VERIO test strip, USE UP TO FOUR TIMES DAILY AS DIRECTED., Disp: 100 strip, Rfl: 3  polyethylene glycol powder (GLYCOLAX/MIRALAX) 17 GM/SCOOP powder, Take by mouth., Disp: , Rfl:    rosuvastatin (CRESTOR) 20 MG tablet, Take 1 tablet (20 mg total) by mouth at bedtime., Disp: 90 tablet, Rfl: 3   Semaglutide (RYBELSUS) 7 MG TABS, TAKE 1 TABLET BY MOUTH EVERY DAY WITH BREAKFAST, Disp: 30 tablet, Rfl: 0  Allergies:  Allergies  Allergen Reactions   Codeine Nausea And Vomiting   Lisinopril Cough   Nsaids Other (See Comments)    CKD   Dapagliflozin Rash   Latex Hives    Past Medical History, Surgical history, Social history, and Family History were reviewed and updated.  Review of Systems: Review of Systems  Constitutional:  Positive for  fatigue.  HENT:  Negative.    Eyes: Negative.   Respiratory: Negative.    Cardiovascular: Negative.   Gastrointestinal: Negative.   Endocrine: Negative.   Genitourinary: Negative.    Musculoskeletal: Negative.   Skin: Negative.   Neurological: Negative.   Hematological: Negative.   Psychiatric/Behavioral: Negative.      Physical Exam:  weight is 162 lb 12.8 oz (73.8 kg). His oral temperature is 97.7 F (36.5 C). His blood pressure is 138/65 and his pulse is 56 (abnormal). His respiration is 18 and oxygen saturation is 100%.   Wt Readings from Last 3 Encounters:  06/24/22 162 lb 12.8 oz (73.8 kg)  06/07/22 168 lb (76.2 kg)  05/25/22 168 lb 12.8 oz (76.6 kg)    Physical Exam Vitals reviewed.  HENT:     Head: Normocephalic and atraumatic.  Eyes:     Pupils: Pupils are equal, round, and reactive to light.  Cardiovascular:     Rate and Rhythm: Normal rate and regular rhythm.     Heart sounds: Normal heart sounds.  Pulmonary:     Effort: Pulmonary effort is normal.     Breath sounds: Normal breath sounds.  Abdominal:     General: Bowel sounds are normal.     Palpations: Abdomen is soft.  Musculoskeletal:        General: No tenderness or deformity. Normal range of motion.     Cervical back: Normal range of motion.  Lymphadenopathy:     Cervical: No cervical adenopathy.  Skin:    General: Skin is warm and dry.     Findings: No erythema or rash.  Neurological:     Mental Status: He is alert and oriented to person, place, and time.  Psychiatric:        Behavior: Behavior normal.        Thought Content: Thought content normal.        Judgment: Judgment normal.      Lab Results  Component Value Date   WBC 3.7 (L) 06/24/2022   HGB 12.6 (L) 06/24/2022   HCT 37.9 (L) 06/24/2022   MCV 90.7 06/24/2022   PLT 165 06/24/2022     Chemistry      Component Value Date/Time   NA 140 06/24/2022 0854   NA 140 08/31/2021 1026   K 4.1 06/24/2022 0854   CL 105 06/24/2022  0854   CO2 27 06/24/2022 0854   BUN 20 06/24/2022 0854   BUN 21 08/31/2021 1026   CREATININE 1.52 (H) 06/24/2022 0854      Component Value Date/Time   CALCIUM 9.3 06/24/2022 0854   ALKPHOS 47 06/24/2022 0854   AST 28 06/24/2022 0854   ALT 25 06/24/2022 0854   BILITOT 0.8 06/24/2022 0854      Impression and Plan:  Marvin Salinas is a very nice 77 year old white male.    He has lambda light chain myeloma. Responding well to treatment. Labs reviewed and acceptable for treatment today of Day 1 Cycle 10- on review flowsheet this is Day 1 Cycle 5   He has mild renal insufficiency which is chronic and stable to improved. Lambda chains are improving with treatment. Overall he is doing well.   24 hour urine ordered today.   Disposition: Darzalex Faspro Velcade today RTC 7 days Day 8 Cycle 6 (10)   Holli Humbles [place], [place] 1/4/202412:45 PM

## 2022-06-24 NOTE — Patient Instructions (Signed)
[facility] [place]  Discharge Instructions: Thank you for choosing [facility] to provide your oncology and hematology care.   If you have a lab appointment with the [facility], please go directly to the [facility] and check in at the registration area.  Wear comfortable clothing and clothing appropriate for easy access to any Portacath or PICC line.   We strive to give you quality time with your provider. You may need to reschedule your appointment if you arrive late (15 or more minutes).  Arriving late affects you and other patients whose appointments are after yours.  Also, if you miss three or more appointments without notifying the office, you may be dismissed from the clinic at the provider's discretion.      For prescription refill requests, have your pharmacy contact our office and allow 72 hours for refills to be completed.    Today you received the following chemotherapy and/or immunotherapy agents Velcade and Faspro    To help prevent nausea and vomiting after your treatment, we encourage you to take your nausea medication as directed.  BELOW ARE SYMPTOMS THAT SHOULD BE REPORTED IMMEDIATELY: *FEVER GREATER THAN 100.4 F (38 C) OR HIGHER *CHILLS OR SWEATING *NAUSEA AND VOMITING THAT IS NOT CONTROLLED WITH YOUR NAUSEA MEDICATION *UNUSUAL SHORTNESS OF BREATH *UNUSUAL BRUISING OR BLEEDING *URINARY PROBLEMS (pain or burning when urinating, or frequent urination) *BOWEL PROBLEMS (unusual diarrhea, constipation, pain near the anus) TENDERNESS IN MOUTH AND THROAT WITH OR WITHOUT PRESENCE OF ULCERS (sore throat, sores in mouth, or a toothache) UNUSUAL RASH, SWELLING OR PAIN  UNUSUAL VAGINAL DISCHARGE OR ITCHING   Items with * indicate a potential emergency and should be followed up as soon as possible or go to the Emergency Department if any problems should occur.  Please show the CHEMOTHERAPY ALERT CARD or IMMUNOTHERAPY ALERT CARD at check-in  to the Emergency Department and triage nurse. Should you have questions after your visit or need to cancel or reschedule your appointment, please contact [facility]  [phone] and follow the prompts.  Office hours are 8:00 a.m. to 4:30 p.m. Monday - Friday. Please note that voicemails left after 4:00 p.m. may not be returned until the following business day.  We are closed weekends and major holidays. You have access to a nurse at all times for urgent questions. Please call the main number to the clinic [phone] and follow the prompts.  For any non-urgent questions, you may also contact your provider using MyChart. We now offer e-Visits for anyone 33 and older to request care online for non-urgent symptoms. For details visit [email or web address].   Also download the MyChart app! Go to the app store, search "MyChart", open the app, select [facility], and log in with your MyChart username and password.  Masks are optional in the cancer centers. If you would like for your care team to wear a mask while they are taking care of you, please let them know. You may have one support person who is at least 78 years old accompany you for your appointments.

## 2022-06-25 LAB — KAPPA/LAMBDA LIGHT CHAINS
Kappa free light chain: 25.9 mg/L — ABNORMAL HIGH (ref 3.3–19.4)
Kappa, lambda light chain ratio: 0.75 (ref 0.26–1.65)
Lambda free light chains: 34.6 mg/L — ABNORMAL HIGH (ref 5.7–26.3)

## 2022-06-26 LAB — IGG, IGA, IGM
IgA: 47 mg/dL — ABNORMAL LOW (ref 61–437)
IgG (Immunoglobin G), Serum: 350 mg/dL — ABNORMAL LOW (ref 603–1613)
IgM (Immunoglobulin M), Srm: 12 mg/dL — ABNORMAL LOW (ref 15–143)

## 2022-06-28 LAB — PROTEIN ELECTROPHORESIS, SERUM, WITH REFLEX
A/G Ratio: 1.6 (ref 0.7–1.7)
Albumin ELP: 3.5 g/dL (ref 2.9–4.4)
Alpha-1-Globulin: 0.3 g/dL (ref 0.0–0.4)
Alpha-2-Globulin: 0.7 g/dL (ref 0.4–1.0)
Beta Globulin: 0.9 g/dL (ref 0.7–1.3)
Gamma Globulin: 0.3 g/dL — ABNORMAL LOW (ref 0.4–1.8)
Globulin, Total: 2.2 g/dL (ref 2.2–3.9)
Total Protein ELP: 5.7 g/dL — ABNORMAL LOW (ref 6.0–8.5)

## 2022-06-30 ENCOUNTER — Encounter: Payer: Self-pay | Admitting: Family Medicine

## 2022-06-30 ENCOUNTER — Ambulatory Visit (INDEPENDENT_AMBULATORY_CARE_PROVIDER_SITE_OTHER): Payer: Medicare Other | Admitting: Family Medicine

## 2022-06-30 VITALS — BP 108/62 | HR 59 | Temp 97.4°F | Ht 68.0 in | Wt 163.0 lb

## 2022-06-30 DIAGNOSIS — E1149 Type 2 diabetes mellitus with other diabetic neurological complication: Secondary | ICD-10-CM | POA: Diagnosis not present

## 2022-06-30 DIAGNOSIS — E782 Mixed hyperlipidemia: Secondary | ICD-10-CM | POA: Diagnosis not present

## 2022-06-30 DIAGNOSIS — I4892 Unspecified atrial flutter: Secondary | ICD-10-CM | POA: Diagnosis not present

## 2022-06-30 DIAGNOSIS — I4891 Unspecified atrial fibrillation: Secondary | ICD-10-CM | POA: Diagnosis not present

## 2022-06-30 DIAGNOSIS — Z951 Presence of aortocoronary bypass graft: Secondary | ICD-10-CM | POA: Diagnosis not present

## 2022-06-30 DIAGNOSIS — I779 Disorder of arteries and arterioles, unspecified: Secondary | ICD-10-CM

## 2022-06-30 DIAGNOSIS — I10 Essential (primary) hypertension: Secondary | ICD-10-CM

## 2022-06-30 DIAGNOSIS — E118 Type 2 diabetes mellitus with unspecified complications: Secondary | ICD-10-CM | POA: Diagnosis not present

## 2022-06-30 DIAGNOSIS — Z794 Long term (current) use of insulin: Secondary | ICD-10-CM | POA: Diagnosis not present

## 2022-06-30 DIAGNOSIS — N1831 Chronic kidney disease, stage 3a: Secondary | ICD-10-CM | POA: Diagnosis not present

## 2022-06-30 DIAGNOSIS — I739 Peripheral vascular disease, unspecified: Secondary | ICD-10-CM | POA: Diagnosis not present

## 2022-06-30 DIAGNOSIS — C9000 Multiple myeloma not having achieved remission: Secondary | ICD-10-CM

## 2022-06-30 LAB — POCT GLYCOSYLATED HEMOGLOBIN (HGB A1C)
HbA1c POC (<> result, manual entry): 8.1 % (ref 4.0–5.6)
HbA1c, POC (controlled diabetic range): 8.1 % — AB (ref 0.0–7.0)
HbA1c, POC (prediabetic range): 8.1 % — AB (ref 5.7–6.4)
Hemoglobin A1C: 8.1 % — AB (ref 4.0–5.6)

## 2022-06-30 MED ORDER — ONETOUCH VERIO VI STRP: ORAL_STRIP | 3 refills | Status: DC

## 2022-06-30 MED ORDER — EZETIMIBE 10 MG PO TABS: 10.0000 mg | ORAL_TABLET | Freq: Every day | ORAL | 3 refills | Status: DC

## 2022-06-30 MED ORDER — INSULIN GLARGINE 100 UNIT/ML SOLOSTAR PEN: 35.0000 [IU] | PEN_INJECTOR | Freq: Two times a day (BID) | SUBCUTANEOUS | 2 refills | Status: DC

## 2022-06-30 MED ORDER — RYBELSUS 7 MG PO TABS: ORAL_TABLET | ORAL | 1 refills | Status: DC

## 2022-06-30 MED ORDER — ONETOUCH DELICA PLUS LANCET33G MISC: 11 refills | Status: DC

## 2022-06-30 MED ORDER — PEN NEEDLES 31G X 6 MM MISC: 1.0000 | Freq: Every day | 2 refills | Status: DC

## 2022-06-30 NOTE — Progress Notes (Signed)
Patient ID: Marvin Salinas, male  DOB: January 03, 1946, 77 y.o.   MRN: [id number] Patient Care Team    Relationship Specialty Notifications Start End  Ma Hillock, DO PCP - General Family Medicine  01/24/19   Chesley Mires, MD Consulting Physician Pulmonary Disease  01/26/19   Calvert Cantor, MD Consulting Physician Ophthalmology  01/26/19   [name], Wonda Cheng, MD Consulting Physician Cardiology  04/30/20   Milus Banister, MD Attending Physician Gastroenterology  10/23/20   Celso Amy, NP Nurse Practitioner Nurse Practitioner  10/23/20    Comment: Hematology  Cameron Sprang, MD Consulting Physician Neurology  09/01/21   Liana Gerold, MD Consulting Physician Nephrology  10/28/21    Comment: [facility]    Chief Complaint  Patient presents with   Diabetes    [facility]; pt is fasting     Subjective: Marvin Salinas is a 77 y.o.  male present for  [facility] Type 2 diabetes mellitus with stage 3 chronic kidney disease, with long-term current use of insulin ([place]) Pt reports compliance with Lantus 30  units in the morning and 35 units nightly, Rybelsus 7 mg daily .Marland Kitchen   He reports his fasting blood glucose is approximately 102.  His nighttime blood glucoses, he is watching his diet closely and has lost weight.  Atrial fibrillation with RVR (HCC)/Essential hypertension/Ischemic heart disease/ S/P CABG x 3/Paroxysmal atrial flutter (HCC)/PAD (peripheral artery disease) (HCC)/Nonsustained ventricular tachycardia (HCC)/HLD/NSTEMI/h/o stroke/CKD 3/chronic anticoagulation Patient has a history of severe 3-vessel coronary artery disease with chronic total occlusions of the proximal/mid LAD, mid/distal left circumflex/and proximal RCA.  Moderate caliber D1 and distal RPDA demonstrate 70-80% stenosis.  Moderately elevated left ventricular filling pressure.  Coronary artery stents and CABG have been completed in the past.  He also has a history of cerebral ischemia and TIAs. Pt reports compliance with   Crestor, Eliquis, Benicar 30, metoprolol 12.5 mg twice daily, Zetia, tikosyn. Patient denies chest pain, shortness of breath, dizziness or lower extremity edema.  Pt is prescribed statin. Diet: Heart healthy-low-sodium diet encouraged RF: Hypertension, hyperlipidemia, former smoker, STEMI, stroke, diabetes, family history, CAD, CVD, PAD, bilateral carotid artery stenosis, TIA       03/31/2022   11:12 AM 03/19/2022    2:00 PM 03/02/2022    1:05 PM 12/09/2021   10:53 AM 10/28/2021    9:13 AM  Depression screen PHQ 2/9  Decreased Interest 0 0 0 0 1  Down, Depressed, Hopeless 0 0 '1 1 1  '$ PHQ - 2 Score 0 0 '1 1 2  '$ Altered sleeping    1 1  Tired, decreased energy    2 1  Change in appetite    0 0  Feeling bad or failure about yourself     0 0  Trouble concentrating    0 0  Moving slowly or fidgety/restless    0 0  Suicidal thoughts    0 0  PHQ-9 Score    4 4  Difficult doing work/chores    Not difficult at all Somewhat difficult      10/28/2021    9:13 AM  GAD 7 : Generalized Anxiety Score  Nervous, Anxious, on Edge 0  Control/stop worrying 1  Worry too much - different things 1  Trouble relaxing 1  Restless 1  Easily annoyed or irritable 0  Afraid - awful might happen 0  Total GAD 7 Score 4          06/26/2022  9:16 AM 03/19/2022    1:59 PM 12/09/2021   10:25 AM 10/28/2021    9:09 AM 09/24/2021   10:24 AM  Fall Risk   Falls in the past year? 1 1 0 1 0  Number falls in past yr: 0 0 0 1 0  Injury with Fall? 0 0 0 0 0  Risk for fall due to :  History of fall(s)  Impaired vision   Follow up  Falls evaluation completed Falls prevention discussed;Education provided;Falls evaluation completed Falls evaluation completed     Immunization History  Administered Date(s) Administered   Fluad Quad(high Dose 65+) 02/12/2019, 04/29/2020, 04/22/2021, 06/09/2022   Influenza Split 05/08/2013, 03/28/2017   Influenza, High Dose Seasonal PF 05/08/2013, 03/28/2017, 05/02/2018    Influenza,inj,Quad PF,6+ Mos 04/21/2016   MODERNA COVID-19 SARS-COV-2 PEDS BIVALENT BOOSTER 6Y-11Y 06/18/2020, 12/10/2020   Moderna Sars-Covid-2 Vaccination 08/27/2019, 10/03/2019   Pneumococcal Conjugate-13 07/13/2016   Pneumococcal Polysaccharide-23 02/21/2018   Tdap 02/15/2013   Zoster, Live 06/22/2015    No results found.  Past Medical History:  Diagnosis Date   Acute kidney injury superimposed on chronic kidney disease ([place]) 10/12/2017   Atrial fibrillation with RVR ([place]) 10/27/2017   Back pain    Basilar artery stenosis    on chronic Plavix   Benign prostatic hypertrophy without urinary obstruction 04/17/2014   Carotid artery disease ([place]) 10/06/2010   Carotid US 04/2019: Bilat ICA 40-59; L subclavian stenosis  // Carotid US 11/21: Bilat ICA 40-59; bilateral subclavian stenosis // Carotid US 11/22: Bilateral ICA 40-59; left subclavian stenosis // Carotid US 04/28/2022: Bilateral ICA 40-59; left subclavian stenosis   Cerebral vascular disease    with prior TIA's; followed by Dr. Erling Cruz   Depression, neurotic 11/21/2013   Diabetes mellitus    on insulin   Enthesopathy of ankle and tarsus 09/04/2007   Overview:  Metatarsalgia    Enthesopathy of ankle and tarsus 09/04/2007   Overview:  Metatarsalgia  Formatting of this note might be different from the original. Metatarsalgia  10/1 IMO update   History of renal calculi    Hyperlipidemia    Hypertension    Ischemic heart disease    prior PCI to RCA in 1989. S/P PCI to LAD and OM in 1992. S/P PCI to first DX in 2000. S/P CABG x 3 in May 2011   Lambda light chain myeloma ([place]) 10/28/2021   Left hip pain 01/03/2020   OSA (obstructive sleep apnea) 01/11/2018   AHI 18.1 and SaO2 low 73%   PAD (peripheral artery disease) ([place]) 11/18/2017   ABIs/Arterial US 04/2019: R 1.21; L 0.66 // R SFA 30-49, stable > 50 CIA and EIA stenosis; L > 50 CIA stenosis (likely represents severe stenosis or short segment occlusion)   Peripheral neuropathy     Pneumonia 02/2011   Sacroiliac joint dysfunction of left side 01/03/2020   Stroke ([place]) 04/16/2001   small right cerebellar infarct on 04/16/2001 at that time he was found to have proximal left vertebral artery, proximal left common carotid artery and both external carotid artery stenosis as well as intracranial stenosis involving mid basilar artery- 07/2011 add questionable TIA   Allergies  Allergen Reactions   Codeine Nausea And Vomiting   Lisinopril Cough   Nsaids Other (See Comments)    CKD   Dapagliflozin Rash   Latex Hives   Past Surgical History:  Procedure Laterality Date    NASAL ENDOSCOPY  01/11/2020   chronic rhinitis w/o evidence of acute sinusitis, bilateral inferior turbinate  hypertrophy   ABDOMINAL AORTOGRAM W/LOWER EXTREMITY Bilateral 05/30/2019   Procedure: ABDOMINAL AORTOGRAM W/LOWER EXTREMITY;  Surgeon: Wellington Hampshire, MD;  Location: [facility];  Service: Cardiovascular;  Laterality: Bilateral;   ANGIOPLASTY  1989   right coronary artery   ANGIOPLASTY  1992   LAD and OM   ANGIOPLASTY  1998   First DX   BRAIN SURGERY     on prior records   CORONARY ARTERY BYPASS GRAFT  11/12/2009   LIMA to LAD, SVG to OM and SVG to RCA   CORONARY STENT PLACEMENT  2000   Stent to LAD/Circumflex with angioplasty to first diagonal    LEFT HEART CATH AND CORS/GRAFTS ANGIOGRAPHY N/A 10/13/2017   Procedure: LEFT HEART CATH AND CORS/GRAFTS ANGIOGRAPHY;  Surgeon: Nelva Bush, MD;  Location: [facility];  Service: Cardiovascular;  Laterality: N/A;   Family History  Problem Relation Age of Onset   Depression Mother    Early death Mother    Kidney disease Mother    Ovarian cancer Mother    Hypertension Father    Heart disease Father    Heart attack Father    Asthma Brother    Diabetes Brother    Stroke Other        Uncle   Diabetes Sister    Colon cancer Neg Hx    Rectal cancer Neg Hx    Stomach cancer Neg Hx    Esophageal cancer Neg Hx    Social  History   Social History Narrative   Marital status/children/pets: divorced.   Education/employment: retired, Patient has his GED. 9th grade education.    Safety:      -smoke alarm in the home:Yes     - wears seatbelt: Yes   Patient is right handed.   Patient does not drink any caffeine.   Lives in a one story home     Allergies as of 06/30/2022       Reactions   Codeine Nausea And Vomiting   Lisinopril Cough   Nsaids Other (See Comments)   CKD   Dapagliflozin Rash   Latex Hives        Medication List        Accurate as of June 30, 2022  8:58 AM. If you have any questions, ask your nurse or doctor.          STOP taking these medications    BD Insulin Syringe U/F 31G X 5/16" 0.5 ML Misc Generic drug: Insulin Syringe-Needle U-100 Stopped by: Howard Pouch, DO       TAKE these medications    blood glucose meter kit and supplies Kit Dispense based on patient and insurance preference. Use up to four times daily as directed.   dofetilide 125 MCG capsule Commonly known as: TIKOSYN TAKE 1 CAPSULE BY MOUTH TWICE A DAY   Eliquis 5 MG Tabs tablet Generic drug: apixaban TAKE 1 TABLET BY MOUTH TWICE A DAY   ezetimibe 10 MG tablet Commonly known as: ZETIA Take 1 tablet (10 mg total) by mouth daily.   famciclovir 250 MG tablet Commonly known as: FAMVIR Take 1 tablet (250 mg total) by mouth daily.   fluticasone 50 MCG/ACT nasal spray Commonly known as: FLONASE Place 2 sprays into both nostrils daily.   GENTEAL OP Place 1 drop into both eyes daily as needed (dry eyes).   insulin glargine 100 UNIT/ML injection Commonly known as: LANTUS 30 units subcu injection in the morning and 35 units subcu injection  before bed. What changed: Another medication with the same name was added. Make sure you understand how and when to take each. Changed by: Howard Pouch, DO   insulin glargine 100 UNIT/ML Solostar Pen Commonly known as: LANTUS Inject 35 Units into the  skin 2 (two) times daily. What changed: You were already taking a medication with the same name, and this prescription was added. Make sure you understand how and when to take each. Changed by: Howard Pouch, DO   lenalidomide 10 MG capsule Commonly known as: REVLIMID Take 1 capsule (10 mg total) by mouth daily. [employer] Auth #[id number]   loratadine 10 MG tablet Commonly known as: CLARITIN Take 10 mg by mouth daily as needed for allergies.   methocarbamol 500 MG tablet Commonly known as: ROBAXIN Take 500 mg by mouth daily.   metoprolol tartrate 25 MG tablet Commonly known as: LOPRESSOR Take 0.5 tablets (12.5 mg total) by mouth 2 (two) times daily.   nitroGLYCERIN 0.4 MG SL tablet Commonly known as: NITROSTAT Place 1 tablet under the tongue every 5 (five) minutes as needed. What changed: Another medication with the same name was removed. Continue taking this medication, and follow the directions you see here. Changed by: Howard Pouch, DO   olmesartan 40 MG tablet Commonly known as: BENICAR Take 20 mg by mouth 2 (two) times daily.   ondansetron 8 MG tablet Commonly known as: ZOFRAN Take 1 tablet (8 mg total) by mouth every 8 (eight) hours as needed for nausea or vomiting.   OneTouch Delica Plus [id number] Misc USE UP TO FOUR TIMES DAILY AS DIRECTED What changed: See the new instructions. Changed by: Howard Pouch, DO   OneTouch Verio test strip Generic drug: glucose blood USE UP TO FOUR TIMES DAILY AS DIRECTED.   Pen Needles 31G X 6 MM Misc 1 Application by Does not apply route at bedtime. Started by: Howard Pouch, DO   polyethylene glycol powder 17 GM/SCOOP powder Commonly known as: GLYCOLAX/MIRALAX Take by mouth.   predniSONE 5 MG tablet Commonly known as: DELTASONE 1 tab tid x 2 days, 1 tab bid x 5 days, 1 tab qd till finished   rosuvastatin 20 MG tablet Commonly known as: CRESTOR Take 1 tablet (20 mg total) by mouth at bedtime.   Rybelsus 7 MG Tabs Generic  drug: Semaglutide TAKE 1 TABLET BY MOUTH EVERY DAY WITH BREAKFAST        All past medical history, surgical history, allergies, family history, immunizations andmedications were updated in the EMR today and reviewed under the history and medication portions of their EMR.      ROS: 14 pt review of systems performed and negative (unless mentioned in an HPI)  Objective: BP 108/62   Pulse (!) 59   Temp (!) 97.4 F (36.3 C) (Oral)   Ht '5\' 8"'$  (1.727 m)   Wt 163 lb (73.9 kg)   SpO2 100%   BMI 24.78 kg/m  Physical Exam Vitals and nursing note reviewed.  Constitutional:      General: He is not in acute distress.    Appearance: Normal appearance. He is not ill-appearing, toxic-appearing or diaphoretic.  HENT:     Head: Normocephalic and atraumatic.  Eyes:     General: No scleral icterus.       Right eye: No discharge.        Left eye: No discharge.     Extraocular Movements: Extraocular movements intact.     Pupils: Pupils are equal, round, and reactive to light.  Cardiovascular:     Rate and Rhythm: Normal rate and regular rhythm.  Pulmonary:     Effort: Pulmonary effort is normal. No respiratory distress.     Breath sounds: Normal breath sounds. No wheezing, rhonchi or rales.  Musculoskeletal:     Right lower leg: No edema.     Left lower leg: No edema.  Skin:    General: Skin is warm and dry.     Coloration: Skin is not jaundiced or pale.     Findings: No rash.  Neurological:     Mental Status: He is alert and oriented to person, place, and time. Mental status is at baseline.  Psychiatric:        Mood and Affect: Mood normal.        Behavior: Behavior normal.        Thought Content: Thought content normal.        Judgment: Judgment normal.     Results for orders placed or performed in visit on 06/30/22 (from the past 24 hour(s))  POCT HgB A1C     Status: Abnormal   Collection Time: 06/30/22  8:26 AM  Result Value Ref Range   Hemoglobin A1C 8.1 (A) 4.0 - 5.6 %    HbA1c POC (<> result, manual entry) 8.1 4.0 - 5.6 %   HbA1c, POC (prediabetic range) 8.1 (A) 5.7 - 6.4 %   HbA1c, POC (controlled diabetic range) 8.1 (A) 0.0 - 7.0 %     Assessment/plan: Marvin Salinas is a 77 y.o. male present for  Type 2 diabetes mellitus with stage 3 chronic kidney disease, with long-term current use of insulin ([place]) -Patient was encouraged to follow a diabetic diet and make an effort to get routine exercise.  -increase Lantus to 35 units a.m /35 units p.m.  - Given his cardiac history and his CKD oral options are limited-insurance does not cover Jardiance.  Developed a rash with [place]. -DISCONTINUED: glipizide 10/10 mg twice daily before meals today. -continue Rybelsus 7 mg daily-hesitant to increase secondary to side effects with increasing Ozempic prior. - Medicines tried: Ozempic (side effects), Farxiga (rash) - He declined nutrition referral - microalb collected today PNA series: Pneumonia series completed Flu shot: Up-to-date 2023 (recommneded yearly).   Foot exam: Referred to podiatry 12/2018-completed t5/03/2022 Eye exam: [facility] [place]. 2023- will be est at a new eye doc. A1c: 8.9>>> 13.7 (08/22/2018)>>11 (01/24/2019)>>10.8 08/20/2019> 10.5>10.3> 6.4 > 6.2 > 8.4 >7.6 today> 8.1 today (requiring chronic steroids for myeloma)   Atrial fibrillation with RVR (HCC)/Essential hypertension/Ischemic heart disease/ S/P CABG x 3/Paroxysmal atrial flutter (HCC)/PAD (peripheral artery disease) (HCC)/Nonsustained ventricular tachycardia (HCC)/HLD/NSTEMI/h/o stroke/PAD Stable - Patient aware goal is less than 130/80.   - Prescribed by cardio: tikosyn, Eliquis (recently changed from xarelto) -Continue Crestor 20 mg daily  -Continue zeita. -Continue Benicar to 40 mg daily.  (Or 20 mg twice daily his choice) he has a prescription from nephrology that he will use- -continue metoprolol 12.5 mg twice daily  - He has stopped smoking !!!!   Chronic kidney disease (CKD),  stage III (moderate) (HCC)/lambda light chain myeloma Renally dose meds. Avoid NSAIDs. PTH/calcium/vitamin D > up-to-date Referred to nephrology 2022.   Established with heme-onc for myeloma Reviewed recent labs 06/24/2022 GFR 47-creatinine 1.52   Return in about 15 weeks (around 10/13/2022) for Routine chronic condition follow-up.  Orders Placed This Encounter  Procedures   POCT HgB A1C    Meds ordered this encounter  Medications   Semaglutide (RYBELSUS)  7 MG TABS    Sig: TAKE 1 TABLET BY MOUTH EVERY DAY WITH BREAKFAST    Dispense:  90 tablet    Refill:  1   Insulin Pen Needle (PEN NEEDLES) 31G X 6 MM MISC    Sig: 1 Application by Does not apply route at bedtime.    Dispense:  100 each    Refill:  2   ezetimibe (ZETIA) 10 MG tablet    Sig: Take 1 tablet (10 mg total) by mouth daily.    Dispense:  90 tablet    Refill:  3   Lancets (ONETOUCH DELICA PLUS [id number]) MISC    Sig: USE UP TO FOUR TIMES DAILY AS DIRECTED    Dispense:  100 each    Refill:  11   glucose blood (ONETOUCH VERIO) test strip    Sig: USE UP TO FOUR TIMES DAILY AS DIRECTED.    Dispense:  100 strip    Refill:  3   insulin glargine (LANTUS) 100 UNIT/ML Solostar Pen    Sig: Inject 35 Units into the skin 2 (two) times daily.    Dispense:  30 mL    Refill:  2    Referral Orders  No referral(s) requested today     Note is dictated utilizing voice recognition software. Although note has been proof read prior to signing, occasional typographical errors still can be missed. If any questions arise, please do not hesitate to call for verification.  Electronically signed by: Howard Pouch, DO [place]

## 2022-06-30 NOTE — Patient Instructions (Addendum)
Return in about 15 weeks (around 10/13/2022) for Routine chronic condition follow-up.        Great to see you today.  I have refilled the medication(s) we provide.   If labs were collected, we will inform you of lab results once received either by echart message or telephone call.   - echart message- for normal results that have been seen by the patient already.   - telephone call: abnormal results or if patient has not viewed results in their echart.

## 2022-07-02 ENCOUNTER — Inpatient Hospital Stay: Payer: 59

## 2022-07-02 ENCOUNTER — Inpatient Hospital Stay (HOSPITAL_BASED_OUTPATIENT_CLINIC_OR_DEPARTMENT_OTHER): Payer: 59 | Admitting: Family

## 2022-07-02 ENCOUNTER — Other Ambulatory Visit: Payer: Self-pay | Admitting: *Deleted

## 2022-07-02 ENCOUNTER — Other Ambulatory Visit: Payer: Self-pay

## 2022-07-02 ENCOUNTER — Encounter: Payer: Self-pay | Admitting: Family

## 2022-07-02 VITALS — BP 151/59 | HR 56 | Temp 97.8°F | Resp 18 | Ht 68.0 in | Wt 163.1 lb

## 2022-07-02 DIAGNOSIS — C9000 Multiple myeloma not having achieved remission: Secondary | ICD-10-CM

## 2022-07-02 DIAGNOSIS — Z5112 Encounter for antineoplastic immunotherapy: Secondary | ICD-10-CM | POA: Diagnosis not present

## 2022-07-02 DIAGNOSIS — Z79899 Other long term (current) drug therapy: Secondary | ICD-10-CM | POA: Diagnosis not present

## 2022-07-02 LAB — CBC WITH DIFFERENTIAL (CANCER CENTER ONLY)
Abs Immature Granulocytes: 0.01 10*3/uL (ref 0.00–0.07)
Basophils Absolute: 0 10*3/uL (ref 0.0–0.1)
Basophils Relative: 1 %
Eosinophils Absolute: 0.2 10*3/uL (ref 0.0–0.5)
Eosinophils Relative: 6 %
HCT: 35.3 % — ABNORMAL LOW (ref 39.0–52.0)
Hemoglobin: 11.5 g/dL — ABNORMAL LOW (ref 13.0–17.0)
Immature Granulocytes: 0 %
Lymphocytes Relative: 21 %
Lymphs Abs: 0.9 10*3/uL (ref 0.7–4.0)
MCH: 29.8 pg (ref 26.0–34.0)
MCHC: 32.6 g/dL (ref 30.0–36.0)
MCV: 91.5 fL (ref 80.0–100.0)
Monocytes Absolute: 0.6 10*3/uL (ref 0.1–1.0)
Monocytes Relative: 15 %
Neutro Abs: 2.4 10*3/uL (ref 1.7–7.7)
Neutrophils Relative %: 57 %
Platelet Count: 113 10*3/uL — ABNORMAL LOW (ref 150–400)
RBC: 3.86 MIL/uL — ABNORMAL LOW (ref 4.22–5.81)
RDW: 15.9 % — ABNORMAL HIGH (ref 11.5–15.5)
WBC Count: 4.2 10*3/uL (ref 4.0–10.5)
nRBC: 0 % (ref 0.0–0.2)

## 2022-07-02 LAB — CMP (CANCER CENTER ONLY)
ALT: 23 U/L (ref 0–44)
AST: 21 U/L (ref 15–41)
Albumin: 4.2 g/dL (ref 3.5–5.0)
Alkaline Phosphatase: 49 U/L (ref 38–126)
Anion gap: 9 (ref 5–15)
BUN: 25 mg/dL — ABNORMAL HIGH (ref 8–23)
CO2: 28 mmol/L (ref 22–32)
Calcium: 9.4 mg/dL (ref 8.9–10.3)
Chloride: 102 mmol/L (ref 98–111)
Creatinine: 1.53 mg/dL — ABNORMAL HIGH (ref 0.61–1.24)
GFR, Estimated: 47 mL/min — ABNORMAL LOW (ref >60)
Glucose, Bld: 85 mg/dL (ref 70–99)
Potassium: 3.7 mmol/L (ref 3.5–5.1)
Sodium: 139 mmol/L (ref 135–145)
Total Bilirubin: 0.6 mg/dL (ref 0.3–1.2)
Total Protein: 6.3 g/dL — ABNORMAL LOW (ref 6.5–8.1)

## 2022-07-02 LAB — PROTEIN, URINE, 24 HOUR
Collection Interval-UPROT: 24 hours
Protein, 24H Urine: 210 mg/d — ABNORMAL HIGH (ref 50–100)
Protein, Urine: 28 mg/dL
Urine Total Volume-UPROT: 750 mL

## 2022-07-02 LAB — LACTATE DEHYDROGENASE: LDH: 166 U/L (ref 98–192)

## 2022-07-02 MED ORDER — BORTEZOMIB CHEMO SQ INJECTION 3.5 MG (2.5MG/ML)
1.3000 mg/m2 | Freq: Once | INTRAMUSCULAR | Status: AC
  Administered 2022-07-02: 2.5 mg via SUBCUTANEOUS
  Filled 2022-07-02: qty 1

## 2022-07-02 MED ORDER — PROCHLORPERAZINE MALEATE 10 MG PO TABS
10.0000 mg | ORAL_TABLET | Freq: Once | ORAL | Status: AC
  Administered 2022-07-02: 10 mg via ORAL
  Filled 2022-07-02: qty 1

## 2022-07-02 MED ORDER — ONDANSETRON HCL 8 MG PO TABS: 8.0000 mg | ORAL_TABLET | Freq: Three times a day (TID) | ORAL | 3 refills | Status: DC | PRN

## 2022-07-02 MED ORDER — LORAZEPAM 1 MG PO TABS
0.5000 mg | ORAL_TABLET | Freq: Once | ORAL | Status: AC
  Administered 2022-07-02: 0.5 mg via ORAL
  Filled 2022-07-02: qty 1

## 2022-07-02 NOTE — Progress Notes (Signed)
Ok to treat with creatinine of 1.53 per Judson Roch, NP. dph

## 2022-07-02 NOTE — Progress Notes (Signed)
Hematology and Oncology Follow Up Visit  Marvin Salinas [id number] 1946-04-06 77 y.o. 07/02/2022   Principle Diagnosis:  Lambda light chain myeloma --normal cytogenetics   Current Therapy:        Faspro/Velcade/Revlimid -- s/p cycle 10 -- start on 11/05/2021 - monthly    Interim History:  Marvin Salinas is here today for follow-up and treatment. He is doing well.  He has stiffness in the left hand with tingling and numbness and tingling sciatica in the left leg. He states that this is arthritis. No swelling in his extremities.  No falls or syncope reported.  Earlier this month lambda light chains were 3.46 mg/dL and M-spike was not observed.  No fever, chills, n/v, cough, rash, dizziness, SOB, chest pain, palpitations, abdominal pain or changes in bowel or bladder habits.  No blood loss, bruising or petechiae.  Appetite and hydration are good. Weight is stable at 163 lbs.   ECOG Performance Status: 1 - Symptomatic but completely ambulatory  Medications:  Allergies as of 07/02/2022       Reactions   Codeine Nausea And Vomiting   Lisinopril Cough   Nsaids Other (See Comments)   CKD   Dapagliflozin Rash   Latex Hives        Medication List        Accurate as of July 02, 2022  1:26 PM. If you have any questions, ask your nurse or doctor.          blood glucose meter kit and supplies Kit Dispense based on patient and insurance preference. Use up to four times daily as directed.   dofetilide 125 MCG capsule Commonly known as: TIKOSYN TAKE 1 CAPSULE BY MOUTH TWICE A DAY   Eliquis 5 MG Tabs tablet Generic drug: apixaban TAKE 1 TABLET BY MOUTH TWICE A DAY   ezetimibe 10 MG tablet Commonly known as: ZETIA Take 1 tablet (10 mg total) by mouth daily.   famciclovir 250 MG tablet Commonly known as: FAMVIR Take 1 tablet (250 mg total) by mouth daily.   fluticasone 50 MCG/ACT nasal spray Commonly known as: FLONASE Place 2 sprays into both nostrils daily.   GENTEAL  OP Place 1 drop into both eyes daily as needed (dry eyes).   insulin glargine 100 UNIT/ML injection Commonly known as: LANTUS 30 units subcu injection in the morning and 35 units subcu injection before bed.   insulin glargine 100 UNIT/ML Solostar Pen Commonly known as: LANTUS Inject 35 Units into the skin 2 (two) times daily.   lenalidomide 10 MG capsule Commonly known as: REVLIMID Take 1 capsule (10 mg total) by mouth daily. [employer] Auth #[id number]   loratadine 10 MG tablet Commonly known as: CLARITIN Take 10 mg by mouth daily as needed for allergies.   methocarbamol 500 MG tablet Commonly known as: ROBAXIN Take 500 mg by mouth daily.   metoprolol tartrate 25 MG tablet Commonly known as: LOPRESSOR Take 0.5 tablets (12.5 mg total) by mouth 2 (two) times daily.   nitroGLYCERIN 0.4 MG SL tablet Commonly known as: NITROSTAT Place 1 tablet under the tongue every 5 (five) minutes as needed.   olmesartan 40 MG tablet Commonly known as: BENICAR Take 20 mg by mouth 2 (two) times daily.   ondansetron 8 MG tablet Commonly known as: ZOFRAN Take 1 tablet (8 mg total) by mouth every 8 (eight) hours as needed for nausea or vomiting.   OneTouch Delica Plus [id number] Misc USE UP TO FOUR TIMES DAILY AS DIRECTED   OneTouch  Verio test strip Generic drug: glucose blood USE UP TO FOUR TIMES DAILY AS DIRECTED.   Pen Needles 31G X 6 MM Misc 1 Application by Does not apply route at bedtime.   polyethylene glycol powder 17 GM/SCOOP powder Commonly known as: GLYCOLAX/MIRALAX Take by mouth.   predniSONE 5 MG tablet Commonly known as: DELTASONE 1 tab tid x 2 days, 1 tab bid x 5 days, 1 tab qd till finished   rosuvastatin 20 MG tablet Commonly known as: CRESTOR Take 1 tablet (20 mg total) by mouth at bedtime.   Rybelsus 7 MG Tabs Generic drug: Semaglutide TAKE 1 TABLET BY MOUTH EVERY DAY WITH BREAKFAST        Allergies:  Allergies  Allergen Reactions   Codeine Nausea And  Vomiting   Lisinopril Cough   Nsaids Other (See Comments)    CKD   Dapagliflozin Rash   Latex Hives    Past Medical History, Surgical history, Social history, and Family History were reviewed and updated.  Review of Systems: All other 10 point review of systems is negative.   Physical Exam:  vitals were not taken for this visit.   Wt Readings from Last 3 Encounters:  06/30/22 163 lb (73.9 kg)  06/24/22 162 lb 12.8 oz (73.8 kg)  06/07/22 168 lb (76.2 kg)    Ocular: Sclerae unicteric, pupils equal, round and reactive to light Ear-nose-throat: Oropharynx clear, dentition fair Lymphatic: No cervical or supraclavicular adenopathy Lungs no rales or rhonchi, good excursion bilaterally Heart regular rate and rhythm, no murmur appreciated Abd soft, nontender, positive bowel sounds MSK no focal spinal tenderness, no joint edema Neuro: non-focal, well-oriented, appropriate affect Breasts: Deferred   Lab Results  Component Value Date   WBC 4.2 07/02/2022   HGB 11.5 (L) 07/02/2022   HCT 35.3 (L) 07/02/2022   MCV 91.5 07/02/2022   PLT 113 (L) 07/02/2022   Lab Results  Component Value Date   FERRITIN 45 10/28/2021   IRON 200 (H) 10/28/2021   TIBC 365 10/28/2021   UIBC 165 10/28/2021   IRONPCTSAT 55 (H) 10/28/2021   Lab Results  Component Value Date   RETICCTPCT 1.5 02/04/2021   RBC 3.86 (L) 07/02/2022   Lab Results  Component Value Date   KPAFRELGTCHN 25.9 (H) 06/24/2022   LAMBDASER 34.6 (H) 06/24/2022   KAPLAMBRATIO 0.75 06/24/2022   Lab Results  Component Value Date   IGGSERUM 350 (L) 06/24/2022   IGA 47 (L) 06/24/2022   IGMSERUM 12 (L) 06/24/2022   Lab Results  Component Value Date   TOTALPROTELP 5.7 (L) 06/24/2022   ALBUMINELP 3.5 06/24/2022   A1GS 0.3 06/24/2022   A2GS 0.7 06/24/2022   BETS 0.9 06/24/2022   GAMS 0.3 (L) 06/24/2022   MSPIKE Not Observed 06/24/2022   SPEI Comment 10/28/2021     Chemistry      Component Value Date/Time   NA 140  06/24/2022 0854   NA 140 08/31/2021 1026   K 4.1 06/24/2022 0854   CL 105 06/24/2022 0854   CO2 27 06/24/2022 0854   BUN 20 06/24/2022 0854   BUN 21 08/31/2021 1026   CREATININE 1.52 (H) 06/24/2022 0854      Component Value Date/Time   CALCIUM 9.3 06/24/2022 0854   ALKPHOS 47 06/24/2022 0854   AST 28 06/24/2022 0854   ALT 25 06/24/2022 0854   BILITOT 0.8 06/24/2022 0854       Impression and Plan: Mr. Brechtel is pleasant 77 yo caucasian gentleman with lambda light chains  myeloma with mild renal insufficiency.  His lambda light chains have remained stable. He brought his 24 hour urine specimen in today and results are pending.  We will proceed with treatment today as planned.  Follow-up in 2 weeks.   Lottie Dawson, NP 1/12/20241:26 PM

## 2022-07-02 NOTE — Patient Instructions (Signed)
[facility]  Discharge Instructions: Thank you for choosing [facility] to provide your oncology and hematology care.   If you have a lab appointment with the [facility], please go directly to the [facility] and check in at the registration area.  Wear comfortable clothing and clothing appropriate for easy access to any Portacath or PICC line.   We strive to give you quality time with your provider. You may need to reschedule your appointment if you arrive late (15 or more minutes).  Arriving late affects you and other patients whose appointments are after yours.  Also, if you miss three or more appointments without notifying the office, you may be dismissed from the clinic at the provider's discretion.      For prescription refill requests, have your pharmacy contact our office and allow 72 hours for refills to be completed.    Today you received the following chemotherapy and/or immunotherapy agents Velcade      To help prevent nausea and vomiting after your treatment, we encourage you to take your nausea medication as directed.  BELOW ARE SYMPTOMS THAT SHOULD BE REPORTED IMMEDIATELY: *FEVER GREATER THAN 100.4 F (38 C) OR HIGHER *CHILLS OR SWEATING *NAUSEA AND VOMITING THAT IS NOT CONTROLLED WITH YOUR NAUSEA MEDICATION *UNUSUAL SHORTNESS OF BREATH *UNUSUAL BRUISING OR BLEEDING *URINARY PROBLEMS (pain or burning when urinating, or frequent urination) *BOWEL PROBLEMS (unusual diarrhea, constipation, pain near the anus) TENDERNESS IN MOUTH AND THROAT WITH OR WITHOUT PRESENCE OF ULCERS (sore throat, sores in mouth, or a toothache) UNUSUAL RASH, SWELLING OR PAIN  UNUSUAL VAGINAL DISCHARGE OR ITCHING   Items with * indicate a potential emergency and should be followed up as soon as possible or go to the Emergency Department if any problems should occur.  Please show the CHEMOTHERAPY ALERT CARD or IMMUNOTHERAPY ALERT CARD at check-in to the  Emergency Department and triage nurse. Should you have questions after your visit or need to cancel or reschedule your appointment, please contact [facility]  [phone] and follow the prompts.  Office hours are 8:00 a.m. to 4:30 p.m. Monday - Friday. Please note that voicemails left after 4:00 p.m. may not be returned until the following business day.  We are closed weekends and major holidays. You have access to a nurse at all times for urgent questions. Please call the main number to the clinic [phone] and follow the prompts.  For any non-urgent questions, you may also contact your provider using MyChart. We now offer e-Visits for anyone 22 and older to request care online for non-urgent symptoms. For details visit [email or web address].   Also download the MyChart app! Go to the app store, search "MyChart", open the app, select [facility], and log in with your MyChart username and password.

## 2022-07-04 LAB — IGG, IGA, IGM
IgA: 39 mg/dL — ABNORMAL LOW (ref 61–437)
IgG (Immunoglobin G), Serum: 322 mg/dL — ABNORMAL LOW (ref 603–1613)
IgM (Immunoglobulin M), Srm: 11 mg/dL — ABNORMAL LOW (ref 15–143)

## 2022-07-05 LAB — KAPPA/LAMBDA LIGHT CHAINS
Kappa free light chain: 26.1 mg/L — ABNORMAL HIGH (ref 3.3–19.4)
Kappa, lambda light chain ratio: 1.05 (ref 0.26–1.65)
Lambda free light chains: 24.9 mg/L (ref 5.7–26.3)

## 2022-07-06 LAB — PROTEIN ELECTROPHORESIS, SERUM
A/G Ratio: 1.6 (ref 0.7–1.7)
Albumin ELP: 3.7 g/dL (ref 2.9–4.4)
Alpha-1-Globulin: 0.3 g/dL (ref 0.0–0.4)
Alpha-2-Globulin: 0.8 g/dL (ref 0.4–1.0)
Beta Globulin: 0.9 g/dL (ref 0.7–1.3)
Gamma Globulin: 0.3 g/dL — ABNORMAL LOW (ref 0.4–1.8)
Globulin, Total: 2.3 g/dL (ref 2.2–3.9)
M-Spike, %: 0.1 g/dL — ABNORMAL HIGH
Total Protein ELP: 6 g/dL (ref 6.0–8.5)

## 2022-07-07 ENCOUNTER — Encounter: Payer: Self-pay | Admitting: Family

## 2022-07-07 ENCOUNTER — Encounter: Payer: Self-pay | Admitting: Hematology & Oncology

## 2022-07-08 ENCOUNTER — Other Ambulatory Visit: Payer: Self-pay | Admitting: *Deleted

## 2022-07-08 DIAGNOSIS — C9000 Multiple myeloma not having achieved remission: Secondary | ICD-10-CM

## 2022-07-08 MED ORDER — LENALIDOMIDE 10 MG PO CAPS: 10.0000 mg | ORAL_CAPSULE | Freq: Every day | ORAL | 0 refills | Status: DC

## 2022-07-14 ENCOUNTER — Telehealth: Payer: Self-pay | Admitting: Family Medicine

## 2022-07-14 NOTE — Telephone Encounter (Signed)
Pt called to report that the lantus pen is not working well for him and would like the original  insulin.

## 2022-07-15 NOTE — Telephone Encounter (Signed)
Pt is scheduled for nurse visit tomorrow

## 2022-07-15 NOTE — Telephone Encounter (Signed)
Please inquire on what exactly her means by "not working" for him. Since it is the same exact medicine, the medicine works and now it is the format that is preferred on his formulary.  Pease see if the problem is something we can assist him with by education/demonstration/nurse visit etc to remove the barrier he is experiencing

## 2022-07-15 NOTE — Telephone Encounter (Signed)
Please advise, pt was seen on 06/30/22

## 2022-07-16 ENCOUNTER — Other Ambulatory Visit: Payer: Self-pay | Admitting: *Deleted

## 2022-07-16 ENCOUNTER — Encounter: Payer: Self-pay | Admitting: Hematology & Oncology

## 2022-07-16 ENCOUNTER — Ambulatory Visit (INDEPENDENT_AMBULATORY_CARE_PROVIDER_SITE_OTHER): Payer: 59

## 2022-07-16 ENCOUNTER — Inpatient Hospital Stay: Payer: 59

## 2022-07-16 ENCOUNTER — Inpatient Hospital Stay (HOSPITAL_BASED_OUTPATIENT_CLINIC_OR_DEPARTMENT_OTHER): Payer: 59 | Admitting: Hematology & Oncology

## 2022-07-16 VITALS — BP 109/52 | HR 61 | Temp 98.0°F | Resp 20 | Ht 68.0 in | Wt 159.8 lb

## 2022-07-16 DIAGNOSIS — D8989 Other specified disorders involving the immune mechanism, not elsewhere classified: Secondary | ICD-10-CM

## 2022-07-16 DIAGNOSIS — C9000 Multiple myeloma not having achieved remission: Secondary | ICD-10-CM

## 2022-07-16 DIAGNOSIS — Z5112 Encounter for antineoplastic immunotherapy: Secondary | ICD-10-CM | POA: Diagnosis not present

## 2022-07-16 DIAGNOSIS — R778 Other specified abnormalities of plasma proteins: Secondary | ICD-10-CM

## 2022-07-16 DIAGNOSIS — D500 Iron deficiency anemia secondary to blood loss (chronic): Secondary | ICD-10-CM

## 2022-07-16 DIAGNOSIS — Z79899 Other long term (current) drug therapy: Secondary | ICD-10-CM | POA: Diagnosis not present

## 2022-07-16 DIAGNOSIS — Z7189 Other specified counseling: Secondary | ICD-10-CM

## 2022-07-16 LAB — CMP (CANCER CENTER ONLY)
ALT: 29 U/L (ref 0–44)
AST: 26 U/L (ref 15–41)
Albumin: 4.1 g/dL (ref 3.5–5.0)
Alkaline Phosphatase: 42 U/L (ref 38–126)
Anion gap: 6 (ref 5–15)
BUN: 24 mg/dL — ABNORMAL HIGH (ref 8–23)
CO2: 28 mmol/L (ref 22–32)
Calcium: 9.5 mg/dL (ref 8.9–10.3)
Chloride: 104 mmol/L (ref 98–111)
Creatinine: 1.59 mg/dL — ABNORMAL HIGH (ref 0.61–1.24)
GFR, Estimated: 44 mL/min — ABNORMAL LOW (ref >60)
Glucose, Bld: 216 mg/dL — ABNORMAL HIGH (ref 70–99)
Potassium: 4.1 mmol/L (ref 3.5–5.1)
Sodium: 138 mmol/L (ref 135–145)
Total Bilirubin: 0.7 mg/dL (ref 0.3–1.2)
Total Protein: 5.8 g/dL — ABNORMAL LOW (ref 6.5–8.1)

## 2022-07-16 LAB — LACTATE DEHYDROGENASE: LDH: 138 U/L (ref 98–192)

## 2022-07-16 LAB — CBC WITH DIFFERENTIAL (CANCER CENTER ONLY)
Abs Immature Granulocytes: 0 10*3/uL (ref 0.00–0.07)
Basophils Absolute: 0.1 10*3/uL (ref 0.0–0.1)
Basophils Relative: 4 %
Eosinophils Absolute: 0.1 10*3/uL (ref 0.0–0.5)
Eosinophils Relative: 5 %
HCT: 35.1 % — ABNORMAL LOW (ref 39.0–52.0)
Hemoglobin: 11.6 g/dL — ABNORMAL LOW (ref 13.0–17.0)
Immature Granulocytes: 0 %
Lymphocytes Relative: 24 %
Lymphs Abs: 0.7 10*3/uL (ref 0.7–4.0)
MCH: 30.4 pg (ref 26.0–34.0)
MCHC: 33 g/dL (ref 30.0–36.0)
MCV: 92.1 fL (ref 80.0–100.0)
Monocytes Absolute: 0.4 10*3/uL (ref 0.1–1.0)
Monocytes Relative: 15 %
Neutro Abs: 1.5 10*3/uL — ABNORMAL LOW (ref 1.7–7.7)
Neutrophils Relative %: 52 %
Platelet Count: 157 10*3/uL (ref 150–400)
RBC: 3.81 MIL/uL — ABNORMAL LOW (ref 4.22–5.81)
RDW: 16.3 % — ABNORMAL HIGH (ref 11.5–15.5)
WBC Count: 2.8 10*3/uL — ABNORMAL LOW (ref 4.0–10.5)
nRBC: 0 % (ref 0.0–0.2)

## 2022-07-16 MED ORDER — DARATUMUMAB-HYALURONIDASE-FIHJ 1800-30000 MG-UT/15ML ~~LOC~~ SOLN
1800.0000 mg | Freq: Once | SUBCUTANEOUS | Status: AC
  Administered 2022-07-16: 1800 mg via SUBCUTANEOUS
  Filled 2022-07-16: qty 15

## 2022-07-16 MED ORDER — DIPHENHYDRAMINE HCL 25 MG PO CAPS
50.0000 mg | ORAL_CAPSULE | Freq: Once | ORAL | Status: AC
  Administered 2022-07-16: 50 mg via ORAL
  Filled 2022-07-16: qty 2

## 2022-07-16 MED ORDER — ACETAMINOPHEN 325 MG PO TABS
650.0000 mg | ORAL_TABLET | Freq: Once | ORAL | Status: AC
  Administered 2022-07-16: 650 mg via ORAL
  Filled 2022-07-16: qty 2

## 2022-07-16 MED ORDER — DEXAMETHASONE 4 MG PO TABS
20.0000 mg | ORAL_TABLET | Freq: Once | ORAL | Status: AC
  Administered 2022-07-16: 20 mg via ORAL
  Filled 2022-07-16: qty 5

## 2022-07-16 MED ORDER — BORTEZOMIB CHEMO SQ INJECTION 3.5 MG (2.5MG/ML)
1.3000 mg/m2 | Freq: Once | INTRAMUSCULAR | Status: AC
  Administered 2022-07-16: 2.5 mg via SUBCUTANEOUS
  Filled 2022-07-16: qty 1

## 2022-07-16 MED ORDER — LORAZEPAM 1 MG PO TABS
0.5000 mg | ORAL_TABLET | Freq: Once | ORAL | Status: AC
  Administered 2022-07-16: 0.5 mg via ORAL
  Filled 2022-07-16: qty 1

## 2022-07-16 NOTE — Progress Notes (Signed)
Hematology and Oncology Follow Up Visit  Marvin Salinas [id number] March 12, 1946 77 y.o. 07/16/2022   Principle Diagnosis:  Lambda light chain myeloma --normal cytogenetics   Current Therapy:        Faspro/Velcade/Revlimid -- s/p cycle 11 -- start on 11/05/2021 - monthly    Interim History:  Marvin Salinas is here today for follow-up and treatment.  His blood sugars are still a problem.  He says that the pharmacy messed up his insulin.  He is going to go to his family doctor to try to have all this fixed.  He has responded very nicely to treatment.  When we last saw him, the Lambda light chain was down to 2.5 mg/dL.  He does have a low IgG level.  When we last saw him, his IgG level was 222 mg/dL.  We will just have to watch this closely.  He still has little bit of sciatica with the left leg.  He does see Orthopedic Surgery for this.  He says he will see them in February.  I am sure that they probably will do a MRI.  His appetite has been good.  He has had no nausea or vomiting.  He has had no change in bowel or bladder habits.  He has had no fever.  He has had no swollen lymph nodes.  He has had no rashes.  Overall, I would say that his performance status is probably ECOG 1.    Medications:  Allergies as of 07/16/2022       Reactions   Codeine Nausea And Vomiting   Lisinopril Cough   Nsaids Other (See Comments)   CKD   Dapagliflozin Rash   Latex Hives        Medication List        Accurate as of July 16, 2022 10:10 AM. If you have any questions, ask your nurse or doctor.          blood glucose meter kit and supplies Kit Dispense based on patient and insurance preference. Use up to four times daily as directed.   dofetilide 125 MCG capsule Commonly known as: TIKOSYN TAKE 1 CAPSULE BY MOUTH TWICE A DAY   Eliquis 5 MG Tabs tablet Generic drug: apixaban TAKE 1 TABLET BY MOUTH TWICE A DAY   ezetimibe 10 MG tablet Commonly known as: ZETIA Take 1 tablet (10 mg  total) by mouth daily.   famciclovir 250 MG tablet Commonly known as: FAMVIR Take 1 tablet (250 mg total) by mouth daily.   fluticasone 50 MCG/ACT nasal spray Commonly known as: FLONASE Place 2 sprays into both nostrils daily.   GENTEAL OP Place 1 drop into both eyes daily as needed (dry eyes).   insulin glargine 100 UNIT/ML Solostar Pen Commonly known as: LANTUS Inject 35 Units into the skin 2 (two) times daily.   lenalidomide 10 MG capsule Commonly known as: REVLIMID Take 1 capsule (10 mg total) by mouth daily. [employer] Auth #[id number] What changed: additional instructions   loratadine 10 MG tablet Commonly known as: CLARITIN Take 10 mg by mouth daily as needed for allergies.   methocarbamol 500 MG tablet Commonly known as: ROBAXIN Take 500 mg by mouth daily.   metoprolol tartrate 25 MG tablet Commonly known as: LOPRESSOR Take 0.5 tablets (12.5 mg total) by mouth 2 (two) times daily.   nitroGLYCERIN 0.4 MG SL tablet Commonly known as: NITROSTAT Place 1 tablet under the tongue every 5 (five) minutes as needed.   olmesartan 40 MG tablet  Commonly known as: BENICAR Take 20 mg by mouth 2 (two) times daily.   ondansetron 8 MG tablet Commonly known as: ZOFRAN Take 1 tablet (8 mg total) by mouth every 8 (eight) hours as needed for nausea or vomiting.   OneTouch Delica Plus [id number] Misc USE UP TO FOUR TIMES DAILY AS DIRECTED   OneTouch Verio test strip Generic drug: glucose blood USE UP TO FOUR TIMES DAILY AS DIRECTED.   Pen Needles 31G X 6 MM Misc 1 Application by Does not apply route at bedtime.   polyethylene glycol powder 17 GM/SCOOP powder Commonly known as: GLYCOLAX/MIRALAX Take by mouth every other day.   rosuvastatin 20 MG tablet Commonly known as: CRESTOR Take 1 tablet (20 mg total) by mouth at bedtime.   Rybelsus 7 MG Tabs Generic drug: Semaglutide TAKE 1 TABLET BY MOUTH EVERY DAY WITH BREAKFAST        Allergies:  Allergies  Allergen  Reactions   Codeine Nausea And Vomiting   Lisinopril Cough   Nsaids Other (See Comments)    CKD   Dapagliflozin Rash   Latex Hives    Past Medical History, Surgical history, Social history, and Family History were reviewed and updated.  Review of Systems: Review of Systems  Constitutional: Negative.   HENT: Negative.    Eyes: Negative.   Respiratory: Negative.    Cardiovascular: Negative.   Gastrointestinal: Negative.   Genitourinary: Negative.   Musculoskeletal:  Positive for back pain.  Skin: Negative.   Neurological:  Positive for tingling.  Endo/Heme/Allergies: Negative.   Psychiatric/Behavioral: Negative.       Physical Exam:  height is '5\' 8"'$  (1.727 m) and weight is 159 lb 12.8 oz (72.5 kg). His oral temperature is 98 F (36.7 C). His blood pressure is 109/52 (abnormal) and his pulse is 61. His respiration is 20 and oxygen saturation is 98%.   Wt Readings from Last 3 Encounters:  07/16/22 159 lb 12.8 oz (72.5 kg)  07/02/22 163 lb 1.9 oz (74 kg)  06/30/22 163 lb (73.9 kg)    Physical Exam Vitals reviewed.  HENT:     Head: Normocephalic and atraumatic.  Eyes:     Pupils: Pupils are equal, round, and reactive to light.  Cardiovascular:     Rate and Rhythm: Normal rate and regular rhythm.     Heart sounds: Normal heart sounds.  Pulmonary:     Effort: Pulmonary effort is normal.     Breath sounds: Normal breath sounds.  Abdominal:     General: Bowel sounds are normal.     Palpations: Abdomen is soft.  Musculoskeletal:        General: No tenderness or deformity. Normal range of motion.     Cervical back: Normal range of motion.  Lymphadenopathy:     Cervical: No cervical adenopathy.  Skin:    General: Skin is warm and dry.     Findings: No erythema or rash.  Neurological:     Mental Status: He is alert and oriented to person, place, and time.  Psychiatric:        Behavior: Behavior normal.        Thought Content: Thought content normal.         Judgment: Judgment normal.      Lab Results  Component Value Date   WBC 2.8 (L) 07/16/2022   HGB 11.6 (L) 07/16/2022   HCT 35.1 (L) 07/16/2022   MCV 92.1 07/16/2022   PLT 157 07/16/2022   Lab Results  Component  Value Date   FERRITIN 45 10/28/2021   IRON 200 (H) 10/28/2021   TIBC 365 10/28/2021   UIBC 165 10/28/2021   IRONPCTSAT 55 (H) 10/28/2021   Lab Results  Component Value Date   RETICCTPCT 1.5 02/04/2021   RBC 3.81 (L) 07/16/2022   Lab Results  Component Value Date   KPAFRELGTCHN 26.1 (H) 07/02/2022   LAMBDASER 24.9 07/02/2022   KAPLAMBRATIO 1.05 07/02/2022   Lab Results  Component Value Date   IGGSERUM 322 (L) 07/02/2022   IGA 39 (L) 07/02/2022   IGMSERUM 11 (L) 07/02/2022   Lab Results  Component Value Date   TOTALPROTELP 6.0 07/02/2022   ALBUMINELP 3.7 07/02/2022   A1GS 0.3 07/02/2022   A2GS 0.8 07/02/2022   BETS 0.9 07/02/2022   GAMS 0.3 (L) 07/02/2022   MSPIKE 0.1 (H) 07/02/2022   SPEI Comment 07/02/2022     Chemistry      Component Value Date/Time   NA 138 07/16/2022 0923   NA 140 08/31/2021 1026   K 4.1 07/16/2022 0923   CL 104 07/16/2022 0923   CO2 28 07/16/2022 0923   BUN 24 (H) 07/16/2022 0923   BUN 21 08/31/2021 1026   CREATININE 1.59 (H) 07/16/2022 0923      Component Value Date/Time   CALCIUM 9.5 07/16/2022 0923   ALKPHOS 42 07/16/2022 0923   AST 26 07/16/2022 0923   ALT 29 07/16/2022 0923   BILITOT 0.7 07/16/2022 0923       Impression and Plan: Mr. Dolberry is pleasant 77 yo caucasian gentleman with lambda light chains myeloma with mild renal insufficiency.  Again, he is responded very nicely.  I suspect that adding the Faspro to the Velcade/Revlimid has been the key.  We will have to see what his 24-hour urine shows movement see him back.  We will now move his protocol to a 28-day cycles.  I think he is doing well enough that we can do this.  At some point, if he continues to do well, then we will try to spread his  treatments out a little bit more.  Will see him back in 1 month.  He comes back next week for his Velcade.    Volanda Napoleon, MD 1/26/202410:10 AM

## 2022-07-16 NOTE — Patient Instructions (Signed)
[facility]  Discharge Instructions: Thank you for choosing [facility] to provide your oncology and hematology care.   If you have a lab appointment with the Cancer Center, please go directly to the Cancer Center and check in at the registration area.  Wear comfortable clothing and clothing appropriate for easy access to any Portacath or PICC line.   We strive to give you quality time with your provider. You may need to reschedule your appointment if you arrive late (15 or more minutes).  Arriving late affects you and other patients whose appointments are after yours.  Also, if you miss three or more appointments without notifying the office, you may be dismissed from the clinic at the provider's discretion.      For prescription refill requests, have your pharmacy contact our office and allow 72 hours for refills to be completed.    Today you received the following chemotherapy and/or immunotherapy agents Velcade, Darzalex.      To help prevent nausea and vomiting after your treatment, we encourage you to take your nausea medication as directed.  BELOW ARE SYMPTOMS THAT SHOULD BE REPORTED IMMEDIATELY: *FEVER GREATER THAN 100.4 F (38 C) OR HIGHER *CHILLS OR SWEATING *NAUSEA AND VOMITING THAT IS NOT CONTROLLED WITH YOUR NAUSEA MEDICATION *UNUSUAL SHORTNESS OF BREATH *UNUSUAL BRUISING OR BLEEDING *URINARY PROBLEMS (pain or burning when urinating, or frequent urination) *BOWEL PROBLEMS (unusual diarrhea, constipation, pain near the anus) TENDERNESS IN MOUTH AND THROAT WITH OR WITHOUT PRESENCE OF ULCERS (sore throat, sores in mouth, or a toothache) UNUSUAL RASH, SWELLING OR PAIN  UNUSUAL VAGINAL DISCHARGE OR ITCHING   Items with * indicate a potential emergency and should be followed up as soon as possible or go to the Emergency Department if any problems should occur.  Please show the CHEMOTHERAPY ALERT CARD or IMMUNOTHERAPY ALERT CARD  at check-in to the Emergency Department and triage nurse. Should you have questions after your visit or need to cancel or reschedule your appointment, please contact [facility]  [phone] and follow the prompts.  Office hours are 8:00 a.m. to 4:30 p.m. Monday - Friday. Please note that voicemails left after 4:00 p.m. may not be returned until the following business day.  We are closed weekends and major holidays. You have access to a nurse at all times for urgent questions. Please call the main number to the clinic [phone] and follow the prompts.  For any non-urgent questions, you may also contact your provider using MyChart. We now offer e-Visits for anyone 18 and older to request care online for non-urgent symptoms. For details visit [email or web address].   Also download the MyChart app! Go to the app store, search "MyChart", open the app, select [facility], and log in with your MyChart username and password.   

## 2022-07-16 NOTE — Progress Notes (Signed)
Ok to treat with creatinine of 1.59 and ANC of 1.5 per Dr Marin Olp. dph

## 2022-07-18 LAB — IGG, IGA, IGM
IgA: 43 mg/dL — ABNORMAL LOW (ref 61–437)
IgG (Immunoglobin G), Serum: 316 mg/dL — ABNORMAL LOW (ref 603–1613)
IgM (Immunoglobulin M), Srm: 10 mg/dL — ABNORMAL LOW (ref 15–143)

## 2022-07-19 LAB — KAPPA/LAMBDA LIGHT CHAINS
Kappa free light chain: 32.6 mg/L — ABNORMAL HIGH (ref 3.3–19.4)
Kappa, lambda light chain ratio: 1.11 (ref 0.26–1.65)
Lambda free light chains: 29.3 mg/L — ABNORMAL HIGH (ref 5.7–26.3)

## 2022-07-20 LAB — HM AWV

## 2022-07-20 NOTE — Progress Notes (Signed)
Pt came in for instructions on how to inj pen vs vials for insulin. Pt reported that his sugar readings were higher at home. Pt was properly trained on how to inj 35 units. Pt was inj 4 unit now.

## 2022-07-21 LAB — PROTEIN ELECTROPHORESIS, SERUM
A/G Ratio: 1.8 — ABNORMAL HIGH (ref 0.7–1.7)
Albumin ELP: 3.5 g/dL (ref 2.9–4.4)
Alpha-1-Globulin: 0.2 g/dL (ref 0.0–0.4)
Alpha-2-Globulin: 0.7 g/dL (ref 0.4–1.0)
Beta Globulin: 0.8 g/dL (ref 0.7–1.3)
Gamma Globulin: 0.3 g/dL — ABNORMAL LOW (ref 0.4–1.8)
Globulin, Total: 2 g/dL — ABNORMAL LOW (ref 2.2–3.9)
Total Protein ELP: 5.5 g/dL — ABNORMAL LOW (ref 6.0–8.5)

## 2022-07-23 ENCOUNTER — Inpatient Hospital Stay: Payer: 59 | Attending: Family

## 2022-07-23 ENCOUNTER — Inpatient Hospital Stay: Payer: 59

## 2022-07-23 VITALS — BP 139/49 | HR 54 | Temp 97.6°F | Resp 18

## 2022-07-23 DIAGNOSIS — E1122 Type 2 diabetes mellitus with diabetic chronic kidney disease: Secondary | ICD-10-CM | POA: Diagnosis not present

## 2022-07-23 DIAGNOSIS — Z79899 Other long term (current) drug therapy: Secondary | ICD-10-CM | POA: Diagnosis not present

## 2022-07-23 DIAGNOSIS — C9000 Multiple myeloma not having achieved remission: Secondary | ICD-10-CM

## 2022-07-23 DIAGNOSIS — N189 Chronic kidney disease, unspecified: Secondary | ICD-10-CM | POA: Diagnosis not present

## 2022-07-23 DIAGNOSIS — Z5112 Encounter for antineoplastic immunotherapy: Secondary | ICD-10-CM | POA: Insufficient documentation

## 2022-07-23 DIAGNOSIS — E1129 Type 2 diabetes mellitus with other diabetic kidney complication: Secondary | ICD-10-CM | POA: Diagnosis not present

## 2022-07-23 DIAGNOSIS — R778 Other specified abnormalities of plasma proteins: Secondary | ICD-10-CM | POA: Insufficient documentation

## 2022-07-23 DIAGNOSIS — I129 Hypertensive chronic kidney disease with stage 1 through stage 4 chronic kidney disease, or unspecified chronic kidney disease: Secondary | ICD-10-CM | POA: Diagnosis not present

## 2022-07-23 DIAGNOSIS — D500 Iron deficiency anemia secondary to blood loss (chronic): Secondary | ICD-10-CM | POA: Diagnosis not present

## 2022-07-23 DIAGNOSIS — D8989 Other specified disorders involving the immune mechanism, not elsewhere classified: Secondary | ICD-10-CM | POA: Diagnosis not present

## 2022-07-23 DIAGNOSIS — R809 Proteinuria, unspecified: Secondary | ICD-10-CM | POA: Diagnosis not present

## 2022-07-23 LAB — CBC WITH DIFFERENTIAL (CANCER CENTER ONLY)
Abs Immature Granulocytes: 0.01 K/uL (ref 0.00–0.07)
Basophils Absolute: 0.1 K/uL (ref 0.0–0.1)
Basophils Relative: 1 %
Eosinophils Absolute: 0.2 K/uL (ref 0.0–0.5)
Eosinophils Relative: 4 %
HCT: 32.3 % — ABNORMAL LOW (ref 39.0–52.0)
Hemoglobin: 10.6 g/dL — ABNORMAL LOW (ref 13.0–17.0)
Immature Granulocytes: 0 %
Lymphocytes Relative: 23 %
Lymphs Abs: 0.9 K/uL (ref 0.7–4.0)
MCH: 30.5 pg (ref 26.0–34.0)
MCHC: 32.8 g/dL (ref 30.0–36.0)
MCV: 92.8 fL (ref 80.0–100.0)
Monocytes Absolute: 0.3 K/uL (ref 0.1–1.0)
Monocytes Relative: 8 %
Neutro Abs: 2.6 K/uL (ref 1.7–7.7)
Neutrophils Relative %: 64 %
Platelet Count: 76 K/uL — ABNORMAL LOW (ref 150–400)
RBC: 3.48 MIL/uL — ABNORMAL LOW (ref 4.22–5.81)
RDW: 16.5 % — ABNORMAL HIGH (ref 11.5–15.5)
WBC Count: 4.1 K/uL (ref 4.0–10.5)
nRBC: 0 % (ref 0.0–0.2)

## 2022-07-23 LAB — CMP (CANCER CENTER ONLY)
ALT: 28 U/L (ref 0–44)
AST: 19 U/L (ref 15–41)
Albumin: 3.9 g/dL (ref 3.5–5.0)
Alkaline Phosphatase: 54 U/L (ref 38–126)
Anion gap: 7 (ref 5–15)
BUN: 28 mg/dL — ABNORMAL HIGH (ref 8–23)
CO2: 28 mmol/L (ref 22–32)
Calcium: 9.2 mg/dL (ref 8.9–10.3)
Chloride: 104 mmol/L (ref 98–111)
Creatinine: 1.62 mg/dL — ABNORMAL HIGH (ref 0.61–1.24)
GFR, Estimated: 43 mL/min — ABNORMAL LOW (ref >60)
Glucose, Bld: 193 mg/dL — ABNORMAL HIGH (ref 70–99)
Potassium: 4.4 mmol/L (ref 3.5–5.1)
Sodium: 139 mmol/L (ref 135–145)
Total Bilirubin: 0.5 mg/dL (ref 0.3–1.2)
Total Protein: 5.8 g/dL — ABNORMAL LOW (ref 6.5–8.1)

## 2022-07-23 MED ORDER — PROCHLORPERAZINE MALEATE 10 MG PO TABS
10.0000 mg | ORAL_TABLET | Freq: Once | ORAL | Status: AC
  Administered 2022-07-23: 10 mg via ORAL
  Filled 2022-07-23: qty 1

## 2022-07-23 MED ORDER — LORAZEPAM 1 MG PO TABS
0.5000 mg | ORAL_TABLET | Freq: Once | ORAL | Status: AC
  Administered 2022-07-23: 0.5 mg via ORAL
  Filled 2022-07-23: qty 1

## 2022-07-23 MED ORDER — BORTEZOMIB CHEMO SQ INJECTION 3.5 MG (2.5MG/ML)
1.3000 mg/m2 | Freq: Once | INTRAMUSCULAR | Status: AC
  Administered 2022-07-23: 2.5 mg via SUBCUTANEOUS
  Filled 2022-07-23: qty 1

## 2022-07-23 NOTE — Patient Instructions (Signed)
Bortezomib Injection What is this medication? BORTEZOMIB (bor TEZ oh mib) treats lymphoma. It may also be used to treat multiple myeloma, a type of bone marrow cancer. It works by blocking a protein that causes cancer cells to grow and multiply. This helps to slow or stop the spread of cancer cells. This medicine may be used for other purposes; ask your health care provider or pharmacist if you have questions. COMMON BRAND NAME(S): Velcade What should I tell my care team before I take this medication? They need to know if you have any of these conditions: Dehydration Diabetes Heart disease Liver disease Tingling of the fingers or toes or other nerve disorder An unusual or allergic reaction to bortezomib, other medications, foods, dyes, or preservatives If you or your partner are pregnant or trying to get pregnant Breastfeeding How should I use this medication? This medication is injected into a vein or under the skin. It is given by your care team in a hospital or clinic setting. Talk to your care team about the use of this medication in children. Special care may be needed. Overdosage: If you think you have taken too much of this medicine contact a poison control center or emergency room at once. NOTE: This medicine is only for you. Do not share this medicine with others. What if I miss a dose? Keep appointments for follow-up doses. It is important not to miss your dose. Call your care team if you are unable to keep an appointment. What may interact with this medication? Ketoconazole Rifampin This list may not describe all possible interactions. Give your health care provider a list of all the medicines, herbs, non-prescription drugs, or dietary supplements you use. Also tell them if you smoke, drink alcohol, or use illegal drugs. Some items may interact with your medicine. What should I watch for while using this medication? Your condition will be monitored carefully while you are  receiving this medication. You may need blood work while taking this medication. This medication may affect your coordination, reaction time, or judgment. Do not drive or operate machinery until you know how this medication affects you. Sit up or stand slowly to reduce the risk of dizzy or fainting spells. Drinking alcohol with this medication can increase the risk of these side effects. This medication may increase your risk of getting an infection. Call your care team for advice if you get a fever, chills, sore throat, or other symptoms of a cold or flu. Do not treat yourself. Try to avoid being around people who are sick. Check with your care team if you have severe diarrhea, nausea, and vomiting, or if you sweat a lot. The loss of too much body fluid may make it dangerous for you to take this medication. Talk to your care team if you may be pregnant. Serious birth defects can occur if you take this medication during pregnancy and for 7 months after the last dose. You will need a negative pregnancy test before starting this medication. Contraception is recommended while taking this medication and for 7 months after the last dose. Your care team can help you find the option that works for you. If your partner can get pregnant, use a condom during sex while taking this medication and for 4 months after the last dose. Do not breastfeed while taking this medication and for 2 months after the last dose. This medication may cause infertility. Talk to your care team if you are concerned about your fertility. What side effects   may I notice from receiving this medication? Side effects that you should report to your care team as soon as possible: Allergic reactions--skin rash, itching, hives, swelling of the face, lips, tongue, or throat Bleeding--bloody or black, tar-like stools, vomiting blood or brown material that looks like coffee grounds, red or dark brown urine, small red or purple spots on skin, unusual  bruising or bleeding Bleeding in the brain--severe headache, stiff neck, confusion, dizziness, change in vision, numbness or weakness of the face, arm, or leg, trouble speaking, trouble walking, vomiting Bowel blockage--stomach cramping, unable to have a bowel movement or pass gas, loss of appetite, vomiting Heart failure--shortness of breath, swelling of the ankles, feet, or hands, sudden weight gain, unusual weakness or fatigue Infection--fever, chills, cough, sore throat, wounds that don't heal, pain or trouble when passing urine, general feeling of discomfort or being unwell Liver injury--right upper belly pain, loss of appetite, nausea, light-colored stool, dark yellow or brown urine, yellowing skin or eyes, unusual weakness or fatigue Low blood pressure--dizziness, feeling faint or lightheaded, blurry vision Lung injury--shortness of breath or trouble breathing, cough, spitting up blood, chest pain, fever Pain, tingling, or numbness in the hands or feet Severe or prolonged diarrhea Stomach pain, bloody diarrhea, pale skin, unusual weakness or fatigue, decrease in the amount of urine, which may be signs of hemolytic uremic syndrome Sudden and severe headache, confusion, change in vision, seizures, which may be signs of posterior reversible encephalopathy syndrome (PRES) TTP--purple spots on the skin or inside the mouth, pale skin, yellowing skin or eyes, unusual weakness or fatigue, fever, fast or irregular heartbeat, confusion, change in vision, trouble speaking, trouble walking Tumor lysis syndrome (TLS)--nausea, vomiting, diarrhea, decrease in the amount of urine, dark urine, unusual weakness or fatigue, confusion, muscle pain or cramps, fast or irregular heartbeat, joint pain Side effects that usually do not require medical attention (report to your care team if they continue or are bothersome): Constipation Diarrhea Fatigue Loss of appetite Nausea This list may not describe all possible  side effects. Call your doctor for medical advice about side effects. You may report side effects to FDA at 1-800-FDA-1088. Where should I keep my medication? This medication is given in a hospital or clinic. It will not be stored at home. NOTE: This sheet is a summary. It may not cover all possible information. If you have questions about this medicine, talk to your doctor, pharmacist, or health care provider.  2023 Elsevier/Gold Standard (2021-11-04 00:00:00)  

## 2022-07-23 NOTE — Progress Notes (Signed)
Okay to treat today with pltc 76 and SCr 1.63 per Dr. Marin Olp

## 2022-07-29 DIAGNOSIS — E1122 Type 2 diabetes mellitus with diabetic chronic kidney disease: Secondary | ICD-10-CM | POA: Diagnosis not present

## 2022-07-29 DIAGNOSIS — R809 Proteinuria, unspecified: Secondary | ICD-10-CM | POA: Diagnosis not present

## 2022-07-29 DIAGNOSIS — I129 Hypertensive chronic kidney disease with stage 1 through stage 4 chronic kidney disease, or unspecified chronic kidney disease: Secondary | ICD-10-CM | POA: Diagnosis not present

## 2022-07-29 DIAGNOSIS — N189 Chronic kidney disease, unspecified: Secondary | ICD-10-CM | POA: Diagnosis not present

## 2022-07-29 DIAGNOSIS — D638 Anemia in other chronic diseases classified elsewhere: Secondary | ICD-10-CM | POA: Diagnosis not present

## 2022-07-29 DIAGNOSIS — E1129 Type 2 diabetes mellitus with other diabetic kidney complication: Secondary | ICD-10-CM | POA: Diagnosis not present

## 2022-07-29 DIAGNOSIS — C9000 Multiple myeloma not having achieved remission: Secondary | ICD-10-CM | POA: Diagnosis not present

## 2022-08-04 ENCOUNTER — Other Ambulatory Visit: Payer: Self-pay

## 2022-08-04 DIAGNOSIS — C9000 Multiple myeloma not having achieved remission: Secondary | ICD-10-CM

## 2022-08-04 MED ORDER — LENALIDOMIDE 10 MG PO CAPS: 10.0000 mg | ORAL_CAPSULE | Freq: Every day | ORAL | 0 refills | Status: DC

## 2022-08-05 ENCOUNTER — Other Ambulatory Visit: Payer: Self-pay

## 2022-08-05 DIAGNOSIS — I4819 Other persistent atrial fibrillation: Secondary | ICD-10-CM

## 2022-08-05 MED ORDER — APIXABAN 5 MG PO TABS: 5.0000 mg | ORAL_TABLET | Freq: Two times a day (BID) | ORAL | 1 refills | Status: DC

## 2022-08-05 MED ORDER — DOFETILIDE 125 MCG PO CAPS: 125.0000 ug | ORAL_CAPSULE | Freq: Two times a day (BID) | ORAL | 0 refills | Status: DC

## 2022-08-05 MED ORDER — NITROGLYCERIN 0.4 MG SL SUBL: 0.4000 mg | SUBLINGUAL_TABLET | SUBLINGUAL | 2 refills | Status: DC | PRN

## 2022-08-05 NOTE — Telephone Encounter (Signed)
Prescription refill request for Eliquis received. Indication: Afib  Last office visit: 05/25/22 Fletcher Anon)  Scr: 1.62 (07/23/22)  Age: 77 Weight: 72.5kg  Appropriate dose. Refill sent.

## 2022-08-12 ENCOUNTER — Ambulatory Visit (HOSPITAL_BASED_OUTPATIENT_CLINIC_OR_DEPARTMENT_OTHER)
Admission: RE | Admit: 2022-08-12 | Discharge: 2022-08-12 | Disposition: A | Discharge status: Home / Self Care | Payer: 59 | Source: Ambulatory Visit | Attending: Hematology & Oncology | Admitting: Hematology & Oncology

## 2022-08-12 ENCOUNTER — Inpatient Hospital Stay: Payer: 59

## 2022-08-12 ENCOUNTER — Inpatient Hospital Stay (HOSPITAL_BASED_OUTPATIENT_CLINIC_OR_DEPARTMENT_OTHER): Payer: 59 | Admitting: Hematology & Oncology

## 2022-08-12 ENCOUNTER — Encounter: Payer: Self-pay | Admitting: Hematology & Oncology

## 2022-08-12 ENCOUNTER — Other Ambulatory Visit: Payer: Self-pay | Admitting: *Deleted

## 2022-08-12 ENCOUNTER — Other Ambulatory Visit: Payer: Self-pay

## 2022-08-12 VITALS — BP 141/62 | HR 85

## 2022-08-12 VITALS — BP 118/57 | HR 56 | Temp 97.7°F | Resp 18 | Ht 68.0 in | Wt 162.8 lb

## 2022-08-12 DIAGNOSIS — C9000 Multiple myeloma not having achieved remission: Secondary | ICD-10-CM

## 2022-08-12 DIAGNOSIS — D500 Iron deficiency anemia secondary to blood loss (chronic): Secondary | ICD-10-CM | POA: Insufficient documentation

## 2022-08-12 DIAGNOSIS — R778 Other specified abnormalities of plasma proteins: Secondary | ICD-10-CM | POA: Insufficient documentation

## 2022-08-12 DIAGNOSIS — R059 Cough, unspecified: Secondary | ICD-10-CM | POA: Diagnosis not present

## 2022-08-12 DIAGNOSIS — D8989 Other specified disorders involving the immune mechanism, not elsewhere classified: Secondary | ICD-10-CM

## 2022-08-12 DIAGNOSIS — Z79899 Other long term (current) drug therapy: Secondary | ICD-10-CM | POA: Diagnosis not present

## 2022-08-12 DIAGNOSIS — Z5112 Encounter for antineoplastic immunotherapy: Secondary | ICD-10-CM | POA: Diagnosis not present

## 2022-08-12 LAB — CMP (CANCER CENTER ONLY)
ALT: 27 U/L (ref 0–44)
AST: 32 U/L (ref 15–41)
Albumin: 4.1 g/dL (ref 3.5–5.0)
Alkaline Phosphatase: 44 U/L (ref 38–126)
Anion gap: 10 (ref 5–15)
BUN: 26 mg/dL — ABNORMAL HIGH (ref 8–23)
CO2: 27 mmol/L (ref 22–32)
Calcium: 9.6 mg/dL (ref 8.9–10.3)
Chloride: 106 mmol/L (ref 98–111)
Creatinine: 1.8 mg/dL — ABNORMAL HIGH (ref 0.61–1.24)
GFR, Estimated: 38 mL/min — ABNORMAL LOW (ref >60)
Glucose, Bld: 96 mg/dL (ref 70–99)
Potassium: 4.2 mmol/L (ref 3.5–5.1)
Sodium: 143 mmol/L (ref 135–145)
Total Bilirubin: 0.4 mg/dL (ref 0.3–1.2)
Total Protein: 6.2 g/dL — ABNORMAL LOW (ref 6.5–8.1)

## 2022-08-12 LAB — CBC WITH DIFFERENTIAL (CANCER CENTER ONLY)
Abs Immature Granulocytes: 0.01 10*3/uL (ref 0.00–0.07)
Basophils Absolute: 0.1 10*3/uL (ref 0.0–0.1)
Basophils Relative: 4 %
Eosinophils Absolute: 0.1 10*3/uL (ref 0.0–0.5)
Eosinophils Relative: 3 %
HCT: 31.8 % — ABNORMAL LOW (ref 39.0–52.0)
Hemoglobin: 10.5 g/dL — ABNORMAL LOW (ref 13.0–17.0)
Immature Granulocytes: 1 %
Lymphocytes Relative: 35 %
Lymphs Abs: 0.8 10*3/uL (ref 0.7–4.0)
MCH: 30.2 pg (ref 26.0–34.0)
MCHC: 33 g/dL (ref 30.0–36.0)
MCV: 91.4 fL (ref 80.0–100.0)
Monocytes Absolute: 0.5 10*3/uL (ref 0.1–1.0)
Monocytes Relative: 24 %
Neutro Abs: 0.7 10*3/uL — ABNORMAL LOW (ref 1.7–7.7)
Neutrophils Relative %: 33 %
Platelet Count: 161 10*3/uL (ref 150–400)
RBC: 3.48 MIL/uL — ABNORMAL LOW (ref 4.22–5.81)
RDW: 15.8 % — ABNORMAL HIGH (ref 11.5–15.5)
WBC Count: 2.2 10*3/uL — ABNORMAL LOW (ref 4.0–10.5)
nRBC: 0 % (ref 0.0–0.2)

## 2022-08-12 LAB — LACTATE DEHYDROGENASE: LDH: 194 U/L — ABNORMAL HIGH (ref 98–192)

## 2022-08-12 MED ORDER — DIPHENHYDRAMINE HCL 25 MG PO CAPS
50.0000 mg | ORAL_CAPSULE | Freq: Once | ORAL | Status: AC
  Administered 2022-08-12: 50 mg via ORAL
  Filled 2022-08-12: qty 2

## 2022-08-12 MED ORDER — DEXAMETHASONE 4 MG PO TABS
20.0000 mg | ORAL_TABLET | Freq: Once | ORAL | Status: AC
  Administered 2022-08-12: 20 mg via ORAL
  Filled 2022-08-12: qty 5

## 2022-08-12 MED ORDER — LORAZEPAM 1 MG PO TABS
0.5000 mg | ORAL_TABLET | Freq: Once | ORAL | Status: AC
  Administered 2022-08-12: 0.5 mg via ORAL
  Filled 2022-08-12: qty 1

## 2022-08-12 MED ORDER — ACETAMINOPHEN 325 MG PO TABS
650.0000 mg | ORAL_TABLET | Freq: Once | ORAL | Status: AC
  Administered 2022-08-12: 650 mg via ORAL
  Filled 2022-08-12: qty 2

## 2022-08-12 MED ORDER — DARATUMUMAB-HYALURONIDASE-FIHJ 1800-30000 MG-UT/15ML ~~LOC~~ SOLN
1800.0000 mg | Freq: Once | SUBCUTANEOUS | Status: AC
  Administered 2022-08-12: 1800 mg via SUBCUTANEOUS
  Filled 2022-08-12: qty 15

## 2022-08-12 NOTE — Progress Notes (Signed)
Hematology and Oncology Follow Up Visit  Marvin Salinas [id number] 12/22/1945 77 y.o. 08/12/2022   Principle Diagnosis:  Lambda light chain myeloma --normal cytogenetics   Current Therapy:        Faspro/Velcade/Revlimid -- s/p cycle #12 -- start on 11/05/2021 - monthly    Interim History:  Marvin Salinas is here today for follow-up and treatment.  So far, he is doing better.  He does have a cough.  We did go ahead and do a chest x-ray on him today.  The chest x-ray did not show any evidence of pneumonia.  His monoclonal studies all look quite good.  There is no monoclonal spike in his blood.  He does have a low IgG level of 316 mg/dL.  As such, we may have to think about giving him IVIG if this cough does not get better.  His last kappa lambda light chain was 2.9 mg/dL.  He has had no problems with pain although he does have issues with sciatica in the left leg.  He sees Orthopedic Surgery for this.  He has had no productive sputum.  He has had no hemoptysis.  There is been no nausea or vomiting.  He has had no obvious change in bowel or bladder habits.  He has had no rashes.  There is been no leg swelling.  Overall, I would have said that his performance status is probably ECOG 1.    Medications:  Allergies as of 08/12/2022       Reactions   Codeine Nausea And Vomiting   Lisinopril Cough   Nsaids Other (See Comments)   CKD   Dapagliflozin Rash   Latex Hives        Medication List        Accurate as of August 12, 2022  9:28 AM. If you have any questions, ask your nurse or doctor.          apixaban 5 MG Tabs tablet Commonly known as: Eliquis Take 1 tablet (5 mg total) by mouth 2 (two) times daily.   blood glucose meter kit and supplies Kit Dispense based on patient and insurance preference. Use up to four times daily as directed.   dofetilide 125 MCG capsule Commonly known as: TIKOSYN Take 1 capsule (125 mcg total) by mouth 2 (two) times daily.   ezetimibe 10  MG tablet Commonly known as: ZETIA Take 1 tablet (10 mg total) by mouth daily.   famciclovir 250 MG tablet Commonly known as: FAMVIR Take 1 tablet (250 mg total) by mouth daily.   fluticasone 50 MCG/ACT nasal spray Commonly known as: FLONASE Place 2 sprays into both nostrils daily.   GENTEAL OP Place 1 drop into both eyes daily as needed (dry eyes).   insulin glargine 100 UNIT/ML Solostar Pen Commonly known as: LANTUS Inject 35 Units into the skin 2 (two) times daily.   lenalidomide 10 MG capsule Commonly known as: REVLIMID Take 1 capsule (10 mg total) by mouth daily. Celgene Auth [id number] 21 days on and 7 days off.   loratadine 10 MG tablet Commonly known as: CLARITIN Take 10 mg by mouth daily as needed for allergies.   methocarbamol 500 MG tablet Commonly known as: ROBAXIN Take 500 mg by mouth daily.   metoprolol tartrate 25 MG tablet Commonly known as: LOPRESSOR Take 0.5 tablets (12.5 mg total) by mouth 2 (two) times daily.   nitroGLYCERIN 0.4 MG SL tablet Commonly known as: NITROSTAT Place 1 tablet (0.4 mg total) under the tongue every  5 (five) minutes as needed.   olmesartan 40 MG tablet Commonly known as: BENICAR Take 20 mg by mouth 2 (two) times daily.   ondansetron 8 MG tablet Commonly known as: ZOFRAN Take 1 tablet (8 mg total) by mouth every 8 (eight) hours as needed for nausea or vomiting.   OneTouch Delica Plus 0000000 Misc USE UP TO FOUR TIMES DAILY AS DIRECTED   OneTouch Verio test strip Generic drug: glucose blood USE UP TO FOUR TIMES DAILY AS DIRECTED.   Pen Needles 31G X 6 MM Misc 1 Application by Does not apply route at bedtime.   polyethylene glycol powder 17 GM/SCOOP powder Commonly known as: GLYCOLAX/MIRALAX Take by mouth every other day.   rosuvastatin 20 MG tablet Commonly known as: CRESTOR Take 1 tablet (20 mg total) by mouth at bedtime.   Rybelsus 7 MG Tabs Generic drug: Semaglutide TAKE 1 TABLET BY MOUTH EVERY DAY WITH  BREAKFAST        Allergies:  Allergies  Allergen Reactions   Codeine Nausea And Vomiting   Lisinopril Cough   Nsaids Other (See Comments)    CKD   Dapagliflozin Rash   Latex Hives    Past Medical History, Surgical history, Social history, and Family History were reviewed and updated.  Review of Systems: Review of Systems  Constitutional: Negative.   HENT: Negative.    Eyes: Negative.   Respiratory: Negative.    Cardiovascular: Negative.   Gastrointestinal: Negative.   Genitourinary: Negative.   Musculoskeletal:  Positive for back pain.  Skin: Negative.   Neurological:  Positive for tingling.  Endo/Heme/Allergies: Negative.   Psychiatric/Behavioral: Negative.       Physical Exam:  height is 5' 8"$  (1.727 m) and weight is 162 lb 12.8 oz (73.8 kg). His oral temperature is 97.7 F (36.5 C). His blood pressure is 118/57 (abnormal) and his pulse is 56 (abnormal). His respiration is 18 and oxygen saturation is 100%.   Wt Readings from Last 3 Encounters:  08/12/22 162 lb 12.8 oz (73.8 kg)  07/16/22 159 lb 12.8 oz (72.5 kg)  07/02/22 163 lb 1.9 oz (74 kg)    Physical Exam Vitals reviewed.  HENT:     Head: Normocephalic and atraumatic.  Eyes:     Pupils: Pupils are equal, round, and reactive to light.  Cardiovascular:     Rate and Rhythm: Normal rate and regular rhythm.     Heart sounds: Normal heart sounds.  Pulmonary:     Effort: Pulmonary effort is normal.     Breath sounds: Normal breath sounds.  Abdominal:     General: Bowel sounds are normal.     Palpations: Abdomen is soft.  Musculoskeletal:        General: No tenderness or deformity. Normal range of motion.     Cervical back: Normal range of motion.  Lymphadenopathy:     Cervical: No cervical adenopathy.  Skin:    General: Skin is warm and dry.     Findings: No erythema or rash.  Neurological:     Mental Status: He is alert and oriented to person, place, and time.  Psychiatric:        Behavior:  Behavior normal.        Thought Content: Thought content normal.        Judgment: Judgment normal.      Lab Results  Component Value Date   WBC 2.2 (L) 08/12/2022   HGB 10.5 (L) 08/12/2022   HCT 31.8 (L) 08/12/2022  MCV 91.4 08/12/2022   PLT 161 08/12/2022   Lab Results  Component Value Date   FERRITIN 45 10/28/2021   IRON 200 (H) 10/28/2021   TIBC 365 10/28/2021   UIBC 165 10/28/2021   IRONPCTSAT 55 (H) 10/28/2021   Lab Results  Component Value Date   RETICCTPCT 1.5 02/04/2021   RBC 3.48 (L) 08/12/2022   Lab Results  Component Value Date   KPAFRELGTCHN 32.6 (H) 07/16/2022   LAMBDASER 29.3 (H) 07/16/2022   KAPLAMBRATIO 1.11 07/16/2022   Lab Results  Component Value Date   IGGSERUM 316 (L) 07/16/2022   IGA 43 (L) 07/16/2022   IGMSERUM 10 (L) 07/16/2022   Lab Results  Component Value Date   TOTALPROTELP 5.5 (L) 07/16/2022   ALBUMINELP 3.5 07/16/2022   A1GS 0.2 07/16/2022   A2GS 0.7 07/16/2022   BETS 0.8 07/16/2022   GAMS 0.3 (L) 07/16/2022   MSPIKE Not Observed 07/16/2022   SPEI Comment 07/16/2022     Chemistry      Component Value Date/Time   NA 139 07/23/2022 1109   NA 140 08/31/2021 1026   K 4.4 07/23/2022 1109   CL 104 07/23/2022 1109   CO2 28 07/23/2022 1109   BUN 28 (H) 07/23/2022 1109   BUN 21 08/31/2021 1026   CREATININE 1.62 (H) 07/23/2022 1109      Component Value Date/Time   CALCIUM 9.2 07/23/2022 1109   ALKPHOS 54 07/23/2022 1109   AST 19 07/23/2022 1109   ALT 28 07/23/2022 1109   BILITOT 0.5 07/23/2022 1109       Impression and Plan: Marvin Salinas is a pleasant 77 yo caucasian gentleman with lambda light chains myeloma with mild renal insufficiency.  I think he is doing pretty well.  His renal function has been holding relatively stable.  We will have him do a 24-hour urine today.  He will bring this back in a couple weeks.  We will go ahead and give him the Faspro today.  I will hold on the Velcade.  He does have the Revlimid but  this is at a low dose.  I think that we can probably move the Velcade to every 2 weeks now.  He seems to be doing quite well.  Maybe, this will help with his blood counts.  I will plan to see him back in another couple weeks.     Volanda Napoleon, MD 2/22/20249:28 AM

## 2022-08-12 NOTE — Progress Notes (Signed)
OK to treat with Faspro only today with WBC-2.2, ANC-0.7 and Creat-1.8 per order of Dr. Marin Olp.

## 2022-08-13 LAB — KAPPA/LAMBDA LIGHT CHAINS
Kappa free light chain: 26.4 mg/L — ABNORMAL HIGH (ref 3.3–19.4)
Kappa, lambda light chain ratio: 0.95 (ref 0.26–1.65)
Lambda free light chains: 27.9 mg/L — ABNORMAL HIGH (ref 5.7–26.3)

## 2022-08-14 LAB — IGG, IGA, IGM
IgA: 40 mg/dL — ABNORMAL LOW (ref 61–437)
IgG (Immunoglobin G), Serum: 320 mg/dL — ABNORMAL LOW (ref 603–1613)
IgM (Immunoglobulin M), Srm: 8 mg/dL — ABNORMAL LOW (ref 15–143)

## 2022-08-17 LAB — PROTEIN ELECTROPHORESIS, SERUM, WITH REFLEX
A/G Ratio: 1.5 (ref 0.7–1.7)
Albumin ELP: 3.4 g/dL (ref 2.9–4.4)
Alpha-1-Globulin: 0.3 g/dL (ref 0.0–0.4)
Alpha-2-Globulin: 0.9 g/dL (ref 0.4–1.0)
Beta Globulin: 0.9 g/dL (ref 0.7–1.3)
Gamma Globulin: 0.2 g/dL — ABNORMAL LOW (ref 0.4–1.8)
Globulin, Total: 2.3 g/dL (ref 2.2–3.9)
Total Protein ELP: 5.7 g/dL — ABNORMAL LOW (ref 6.0–8.5)

## 2022-08-19 ENCOUNTER — Inpatient Hospital Stay: Payer: 59

## 2022-08-19 VITALS — BP 144/59 | HR 54 | Temp 97.5°F | Resp 18

## 2022-08-19 DIAGNOSIS — D8989 Other specified disorders involving the immune mechanism, not elsewhere classified: Secondary | ICD-10-CM | POA: Diagnosis not present

## 2022-08-19 DIAGNOSIS — C9000 Multiple myeloma not having achieved remission: Secondary | ICD-10-CM

## 2022-08-19 DIAGNOSIS — Z79899 Other long term (current) drug therapy: Secondary | ICD-10-CM | POA: Diagnosis not present

## 2022-08-19 DIAGNOSIS — R778 Other specified abnormalities of plasma proteins: Secondary | ICD-10-CM | POA: Diagnosis not present

## 2022-08-19 DIAGNOSIS — D500 Iron deficiency anemia secondary to blood loss (chronic): Secondary | ICD-10-CM | POA: Diagnosis not present

## 2022-08-19 DIAGNOSIS — Z5112 Encounter for antineoplastic immunotherapy: Secondary | ICD-10-CM | POA: Diagnosis not present

## 2022-08-19 LAB — CBC WITH DIFFERENTIAL (CANCER CENTER ONLY)
Abs Immature Granulocytes: 0.03 10*3/uL (ref 0.00–0.07)
Basophils Absolute: 0.1 10*3/uL (ref 0.0–0.1)
Basophils Relative: 1 %
Eosinophils Absolute: 0.2 10*3/uL (ref 0.0–0.5)
Eosinophils Relative: 3 %
HCT: 32.8 % — ABNORMAL LOW (ref 39.0–52.0)
Hemoglobin: 10.9 g/dL — ABNORMAL LOW (ref 13.0–17.0)
Immature Granulocytes: 1 %
Lymphocytes Relative: 17 %
Lymphs Abs: 1 10*3/uL (ref 0.7–4.0)
MCH: 30.6 pg (ref 26.0–34.0)
MCHC: 33.2 g/dL (ref 30.0–36.0)
MCV: 92.1 fL (ref 80.0–100.0)
Monocytes Absolute: 0.4 10*3/uL (ref 0.1–1.0)
Monocytes Relative: 6 %
Neutro Abs: 4.3 10*3/uL (ref 1.7–7.7)
Neutrophils Relative %: 72 %
Platelet Count: 155 10*3/uL (ref 150–400)
RBC: 3.56 MIL/uL — ABNORMAL LOW (ref 4.22–5.81)
RDW: 15.8 % — ABNORMAL HIGH (ref 11.5–15.5)
WBC Count: 6 10*3/uL (ref 4.0–10.5)
nRBC: 0 % (ref 0.0–0.2)

## 2022-08-19 LAB — CMP (CANCER CENTER ONLY)
ALT: 78 U/L — ABNORMAL HIGH (ref 0–44)
AST: 18 U/L (ref 15–41)
Albumin: 4.1 g/dL (ref 3.5–5.0)
Alkaline Phosphatase: 53 U/L (ref 38–126)
Anion gap: 10 (ref 5–15)
BUN: 22 mg/dL (ref 8–23)
CO2: 26 mmol/L (ref 22–32)
Calcium: 9.2 mg/dL (ref 8.9–10.3)
Chloride: 102 mmol/L (ref 98–111)
Creatinine: 1.51 mg/dL — ABNORMAL HIGH (ref 0.61–1.24)
GFR, Estimated: 47 mL/min — ABNORMAL LOW (ref >60)
Glucose, Bld: 226 mg/dL — ABNORMAL HIGH (ref 70–99)
Potassium: 4.2 mmol/L (ref 3.5–5.1)
Sodium: 138 mmol/L (ref 135–145)
Total Bilirubin: 0.6 mg/dL (ref 0.3–1.2)
Total Protein: 5.9 g/dL — ABNORMAL LOW (ref 6.5–8.1)

## 2022-08-19 MED ORDER — LORAZEPAM 1 MG PO TABS
0.5000 mg | ORAL_TABLET | Freq: Once | ORAL | Status: AC
  Administered 2022-08-19: 0.5 mg via ORAL
  Filled 2022-08-19: qty 1

## 2022-08-19 MED ORDER — BORTEZOMIB CHEMO SQ INJECTION 3.5 MG (2.5MG/ML)
1.3000 mg/m2 | Freq: Once | INTRAMUSCULAR | Status: AC
  Administered 2022-08-19: 2.5 mg via SUBCUTANEOUS
  Filled 2022-08-19: qty 1

## 2022-08-19 MED ORDER — PROCHLORPERAZINE MALEATE 10 MG PO TABS
10.0000 mg | ORAL_TABLET | Freq: Once | ORAL | Status: AC
  Administered 2022-08-19: 10 mg via ORAL
  Filled 2022-08-19: qty 1

## 2022-08-19 NOTE — Progress Notes (Signed)
OK to treat with creatinine level from today per Dr. Marin Olp.

## 2022-08-24 LAB — UPEP/UIFE/LIGHT CHAINS/TP, 24-HR UR
% BETA, Urine: 19.9 %
ALPHA 1 URINE: 4.2 %
Albumin, U: 59.5 %
Alpha 2, Urine: 7.5 %
Free Kappa Lt Chains,Ur: 69.84 mg/L (ref 1.17–86.46)
Free Kappa/Lambda Ratio: 4.17 (ref 1.83–14.26)
Free Lambda Lt Chains,Ur: 16.76 mg/L — ABNORMAL HIGH (ref 0.27–15.21)
GAMMA GLOBULIN URINE: 9 %
Total Protein, Urine-Ur/day: 196 mg/24 hr — ABNORMAL HIGH (ref 30–150)
Total Protein, Urine: 21.2 mg/dL
Total Volume: 925

## 2022-08-26 ENCOUNTER — Encounter: Payer: Self-pay | Admitting: Cardiovascular Disease

## 2022-08-26 NOTE — Progress Notes (Unsigned)
[name] Date of Birth  07-24-1945       [facility] [id number], [place], [place]  [address]   [place], [place]  [address] [phone]     [phone]   Fax  [phone]    Fax [phone]  Problem List: 1. Coronary artery disease-status post CABG-2001 2. Hypertension 3. Hyperlipidemia 4. Diabetes mellitus 5. Extensive cerebrovascular disease with prior TIAs and known basilar artery stenosis    Laveda Abbe is seen back today for a 3 month check.  He is a former patient of Dr. Susa Simmonds. He has known CAD with prior CABG back in 2011. Other issues include HTN, HLD, DM and extensive cerebral vascular disease with prior TIA's and known basilar artery stenosis. He is on chronic Plavix and aspirin therapy. Was on coumadin in the remote past.  Pt has done well. Complains of back pain.  He gets some regular exercise - walks on occasion.  September 08, 2015: Mr. Schaum is seen back after a 3-4 year absence. Former patient of [facility].  CAD - CABG in 2011,   Cerebral vascular disease   Doing well,  No CP , no further TIAs  HR is slow,  No syncope or presyncope.   HR has been slow for a while  Exercises,  Still paints houses.   Oct 20, 2016:  Laveda Abbe is doing well from a cardiac standpont. No CP or dyspnea Has leg cramp / burning with exercise Is on pravachol for hyperlipidemia,  Has not tolerated the other statins   Sept. 26, 2019:  Laveda Abbe has done well.  He was admitted to the hospital in April, 2019 with a non-ST segment elevation myocardial infarction.  He also had developed atrial flutter with rapid rates.  He spontaneously converted to sinus rhythm.  He was started on Tikosyn at that time.  Heart catheterization revealed patent grafts.  Has had rare episodes of chest pressure - thinks it was indigestion. Processes on a regular basis.  He walks regularly.  He also has a history of hyperlipidemia and diabetes  mellitus.  October 01, 2019:  Laveda Abbe is seen today for follow-up of his coronary artery disease, hyperlipidemia, atrial fibrillation and peripheral arterial disease.   Dr. Fletcher Anon performed successful angioplasty and stent placement to the left common iliac artery.  He is been doing well with minimal claudication since that time. Still having knee pain and hip pain .   No CP  Still smokes - wants to quit.    Sept. 14, 2021  Laveda Abbe is seen today for follow up of his CAD, atrial fib, PAD  Still smoking  Sees Mujhamad Arida - had peripheral stenting in Dec.  Has cad, - hx of CABG Needs a back injection He is at low risk for his upcoming injection and may hold his xarelto for 3 days prior to injection    Sept. 12, 2022  Laveda Abbe is seen for follow up visit Hx of CAD, PAD   Lipids from May 5, , 2022 look good.  Still smoking - has cut back  Has PVD - knows that he needs to stop smoking    August 31, 2021 Laveda Abbe is seen for follow up visit Has CAD, PAD Still smoking  Has cut his smoking ,  working on quitting .   Gae Dry  for PAD    August 27, 2022 Laveda Abbe is seen for follow up of his CAD,  PAD  Was diagnosed with multiple myeloma, has been on chemo for 11 months .  Has lost weight . Wt is 165 lbs  No CP .  Quit smoking in January  Sees Dr. Fletcher Anon for PAD     Current Outpatient Medications on File Prior to Visit  Medication Sig Dispense Refill   apixaban (ELIQUIS) 5 MG TABS tablet Take 1 tablet (5 mg total) by mouth 2 (two) times daily. 180 tablet 1   blood glucose meter kit and supplies KIT Dispense based on patient and insurance preference. Use up to four times daily as directed. 1 each 0   Carboxymethylcell-Hypromellose (GENTEAL OP) Place 1 drop into both eyes daily as needed (dry eyes).     dofetilide (TIKOSYN) 125 MCG capsule Take 1 capsule (125 mcg total) by mouth 2 (two) times daily. 180 capsule 0   ezetimibe (ZETIA) 10 MG tablet Take 1 tablet (10 mg total) by mouth daily. 90  tablet 3   famciclovir (FAMVIR) 250 MG tablet Take 1 tablet (250 mg total) by mouth daily. 30 tablet 12   fluticasone (FLONASE) 50 MCG/ACT nasal spray Place 2 sprays into both nostrils daily.     glucose blood (ONETOUCH VERIO) test strip USE UP TO FOUR TIMES DAILY AS DIRECTED. 100 strip 3   Insulin Pen Needle (PEN NEEDLES) 31G X 6 MM MISC 1 Application by Does not apply route at bedtime. 100 each 2   Lancets (ONETOUCH DELICA PLUS 123XX123) MISC USE UP TO FOUR TIMES DAILY AS DIRECTED 100 each 11   lenalidomide (REVLIMID) 10 MG capsule Take 1 capsule (10 mg total) by mouth daily. Cel[employer] Auth [id number] 21 days on and 7 days off. 21 capsule 0   loratadine (CLARITIN) 10 MG tablet Take 10 mg by mouth daily as needed for allergies.     methocarbamol (ROBAXIN) 500 MG tablet Take 500 mg by mouth daily.     metoprolol tartrate (LOPRESSOR) 25 MG tablet Take 0.5 tablets (12.5 mg total) by mouth 2 (two) times daily. 90 tablet 1   nitroGLYCERIN (NITROSTAT) 0.4 MG SL tablet Place 0.4 mg under the tongue every 5 (five) minutes as needed for chest pain.     olmesartan (BENICAR) 40 MG tablet Take 20 mg by mouth 2 (two) times daily.     ondansetron (ZOFRAN) 8 MG tablet Take 1 tablet (8 mg total) by mouth every 8 (eight) hours as needed for nausea or vomiting. 30 tablet 3   polyethylene glycol powder (GLYCOLAX/MIRALAX) 17 GM/SCOOP powder Take by mouth every other day.     rosuvastatin (CRESTOR) 20 MG tablet Take 1 tablet (20 mg total) by mouth at bedtime. 90 tablet 3   Semaglutide (RYBELSUS) 7 MG TABS TAKE 1 TABLET BY MOUTH EVERY DAY WITH BREAKFAST 90 tablet 1   insulin glargine (LANTUS) 100 UNIT/ML Solostar Pen Inject 35 Units into the skin 2 (two) times daily. 30 mL 2   [DISCONTINUED] prochlorperazine (COMPAZINE) 10 MG tablet Take 1 tablet (10 mg total) by mouth every 6 (six) hours as needed (Nausea or vomiting). 30 tablet 1   No current facility-administered medications on file prior to visit.     Allergies   Allergen Reactions   Codeine Nausea And Vomiting   Lisinopril Cough   Nsaids Other (See Comments)    CKD   Dapagliflozin Rash   Latex Hives    Past Medical History:  Diagnosis Date   Acute kidney injury superimposed on chronic kidney disease ([place]) 10/12/2017   Atrial fibrillation  with RVR ([place]) 10/27/2017   Back pain    Basilar artery stenosis    on chronic Plavix   Benign prostatic hypertrophy without urinary obstruction 04/17/2014   Carotid artery disease ([place]) 10/06/2010   Carotid US 04/2019: Bilat ICA 40-59; L subclavian stenosis  // Carotid US 11/21: Bilat ICA 40-59; bilateral subclavian stenosis // Carotid US 11/22: Bilateral ICA 40-59; left subclavian stenosis // Carotid US 04/28/2022: Bilateral ICA 40-59; left subclavian stenosis   Cerebral vascular disease    with prior TIA's; followed by Dr. Erling Cruz   Depression, neurotic 11/21/2013   Diabetes mellitus    on insulin   Enthesopathy of ankle and tarsus 09/04/2007   Overview:  Metatarsalgia    Enthesopathy of ankle and tarsus 09/04/2007   Overview:  Metatarsalgia  Formatting of this note might be different from the original. Metatarsalgia  10/1 IMO update   History of renal calculi    Hyperlipidemia    Hypertension    Ischemic heart disease    prior PCI to RCA in 1989. S/P PCI to LAD and OM in 1992. S/P PCI to first DX in 2000. S/P CABG x 3 in May 2011   Lambda light chain myeloma ([place]) 10/28/2021   Left hip pain 01/03/2020   OSA (obstructive sleep apnea) 01/11/2018   AHI 18.1 and SaO2 low 73%   PAD (peripheral artery disease) ([place]) 11/18/2017   ABIs/Arterial US 04/2019: R 1.21; L 0.66 // R SFA 30-49, stable > 50 CIA and EIA stenosis; L > 50 CIA stenosis (likely represents severe stenosis or short segment occlusion)   Peripheral neuropathy    Pneumonia 02/2011   Sacroiliac joint dysfunction of left side 01/03/2020   Stroke ([place]) 04/16/2001   small right cerebellar infarct on 04/16/2001 at that time he was found to have  proximal left vertebral artery, proximal left common carotid artery and both external carotid artery stenosis as well as intracranial stenosis involving mid basilar artery- 07/2011 add questionable TIA    Past Surgical History:  Procedure Laterality Date    NASAL ENDOSCOPY  01/11/2020   chronic rhinitis w/o evidence of acute sinusitis, bilateral inferior turbinate hypertrophy   ABDOMINAL AORTOGRAM W/LOWER EXTREMITY Bilateral 05/30/2019   Procedure: ABDOMINAL AORTOGRAM W/LOWER EXTREMITY;  Surgeon: Wellington Hampshire, MD;  Location: [facility];  Service: Cardiovascular;  Laterality: Bilateral;   ANGIOPLASTY  1989   right coronary artery   ANGIOPLASTY  1992   LAD and OM   ANGIOPLASTY  1998   First DX   BRAIN SURGERY     on prior records   CORONARY ARTERY BYPASS GRAFT  11/12/2009   LIMA to LAD, SVG to OM and SVG to RCA   CORONARY STENT PLACEMENT  2000   Stent to LAD/Circumflex with angioplasty to first diagonal    LEFT HEART CATH AND CORS/GRAFTS ANGIOGRAPHY N/A 10/13/2017   Procedure: LEFT HEART CATH AND CORS/GRAFTS ANGIOGRAPHY;  Surgeon: Nelva Bush, MD;  Location: [facility];  Service: Cardiovascular;  Laterality: N/A;    Social History   Tobacco Use  Smoking Status Former   Packs/day: 0.25   Years: 54.00   Total pack years: 13.50   Types: Cigarettes   Quit date: 06/29/2022   Years since quitting: 0.1  Smokeless Tobacco Never  Tobacco Comments   Smokes 1-2 cigarettes daily.     Social History   Substance and Sexual Activity  Alcohol Use Never    Family History  Problem Relation Age of Onset  Depression Mother    Early death Mother    Kidney disease Mother    Ovarian cancer Mother    Hypertension Father    Heart disease Father    Heart attack Father    Asthma Brother    Diabetes Brother    Stroke Other        Uncle   Diabetes Sister    Colon cancer Neg Hx    Rectal cancer Neg Hx    Stomach cancer Neg Hx    Esophageal cancer Neg Hx      Reviw of Systems:  Reviewed in the HPI.  All other systems are negative.   Physical Exam: Blood pressure 128/60, pulse 68, height '5\' 8"'$  (1.727 m), weight 165 lb (74.8 kg), SpO2 98 %.       GEN: Chronically ill-appearing, elderly gentleman in no acute distress HEENT: Normal NECK: No JVD; left carotid bruit  LYMPHATICS: No lymphadenopathy CARDIAC: RRR , no murmurs, rubs, gallops RESPIRATORY:  Clear to auscultation without rales, wheezing or rhonchi  ABDOMEN: Soft, non-tender, non-distended MUSCULOSKELETAL:  No edema; No deformity  SKIN: Warm and dry NEUROLOGIC:  Alert and oriented x 3    ECG:         Assessment / Plan:   1. Coronary artery disease: He is not having any episodes of angina.  Continue current medications.  2.  Atrial fib:    2. Hypertension -   blood pressure is well-controlled.  He remains in sinus rhythm.   3. Hyperlipidemia -we will check lipids today.  His last lipid levels look great.   4. Diabetes mellitus -  5.  Carotid artery disease:        Mertie Moores, MD  08/27/2022 2:19 PM    [facility] [place],  [address] [place], [place]  [address] Pager [phone] Phone: [phone]; Fax: [phone]

## 2022-08-27 ENCOUNTER — Other Ambulatory Visit: Payer: Self-pay | Admitting: *Deleted

## 2022-08-27 ENCOUNTER — Ambulatory Visit: Payer: 59 | Attending: Cardiovascular Disease | Admitting: Cardiovascular Disease

## 2022-08-27 ENCOUNTER — Encounter: Payer: Self-pay | Admitting: Cardiovascular Disease

## 2022-08-27 VITALS — BP 128/60 | HR 68 | Ht 68.0 in | Wt 165.0 lb

## 2022-08-27 DIAGNOSIS — I2510 Atherosclerotic heart disease of native coronary artery without angina pectoris: Secondary | ICD-10-CM | POA: Diagnosis not present

## 2022-08-27 DIAGNOSIS — I739 Peripheral vascular disease, unspecified: Secondary | ICD-10-CM | POA: Diagnosis not present

## 2022-08-27 DIAGNOSIS — C9000 Multiple myeloma not having achieved remission: Secondary | ICD-10-CM

## 2022-08-27 MED ORDER — LENALIDOMIDE 10 MG PO CAPS: 10.0000 mg | ORAL_CAPSULE | Freq: Every day | ORAL | 0 refills | Status: DC

## 2022-08-27 NOTE — Patient Instructions (Signed)
Medication Instructions:  Your physician recommends that you continue on your current medications as directed. Please refer to the Current Medication list given to you today.  *If you need a refill on your cardiac medications before your next appointment, please call your pharmacy*   Lab Work: Lipids today If you have labs (blood work) drawn today and your tests are completely normal, you will receive your results only by: [facility] (if you have MyChart) OR A paper copy in the mail If you have any lab test that is abnormal or we need to change your treatment, we will call you to review the results.   Testing/Procedures: NONE   Follow-Up: At [facility], you and your health needs are our priority.  As part of our continuing mission to provide you with exceptional heart care, we have created designated Provider Care Teams.  These Care Teams include your primary Cardiologist (physician) and Advanced Practice Providers (APPs -  Physician Assistants and Nurse Practitioners) who all work together to provide you with the care you need, when you need it.  We recommend signing up for the patient portal called "MyChart".  Sign up information is provided on this After Visit Summary.  MyChart is used to connect with patients for Virtual Visits (Telemedicine).  Patients are able to view lab/test results, encounter notes, upcoming appointments, etc.  Non-urgent messages can be sent to your provider as well.   To learn more about what you can do with MyChart, go to [email or web address].    Your next appointment:   1 year(s)  Provider:   Mertie Moores, MD

## 2022-08-28 LAB — LIPID PANEL
Chol/HDL Ratio: 2 ratio (ref 0.0–5.0)
Cholesterol, Total: 120 mg/dL (ref 100–199)
HDL: 60 mg/dL (ref >39)
LDL Chol Calc (NIH): 41 mg/dL (ref 0–99)
Triglycerides: 102 mg/dL (ref 0–149)
VLDL Cholesterol Cal: 19 mg/dL (ref 5–40)

## 2022-09-09 ENCOUNTER — Inpatient Hospital Stay: Payer: 59 | Attending: Family

## 2022-09-09 ENCOUNTER — Encounter: Payer: Self-pay | Admitting: Hematology & Oncology

## 2022-09-09 ENCOUNTER — Inpatient Hospital Stay: Payer: 59

## 2022-09-09 ENCOUNTER — Inpatient Hospital Stay (HOSPITAL_BASED_OUTPATIENT_CLINIC_OR_DEPARTMENT_OTHER): Payer: 59 | Admitting: Hematology & Oncology

## 2022-09-09 VITALS — BP 113/44 | HR 56 | Temp 97.9°F | Resp 20 | Ht 68.0 in | Wt 163.0 lb

## 2022-09-09 VITALS — BP 161/54 | HR 48

## 2022-09-09 DIAGNOSIS — Z79899 Other long term (current) drug therapy: Secondary | ICD-10-CM | POA: Diagnosis not present

## 2022-09-09 DIAGNOSIS — Z5112 Encounter for antineoplastic immunotherapy: Secondary | ICD-10-CM | POA: Diagnosis not present

## 2022-09-09 DIAGNOSIS — C9000 Multiple myeloma not having achieved remission: Secondary | ICD-10-CM

## 2022-09-09 LAB — CMP (CANCER CENTER ONLY)
ALT: 39 U/L (ref 0–44)
AST: 35 U/L (ref 15–41)
Albumin: 4.1 g/dL (ref 3.5–5.0)
Alkaline Phosphatase: 42 U/L (ref 38–126)
Anion gap: 8 (ref 5–15)
BUN: 28 mg/dL — ABNORMAL HIGH (ref 8–23)
CO2: 27 mmol/L (ref 22–32)
Calcium: 9.1 mg/dL (ref 8.9–10.3)
Chloride: 104 mmol/L (ref 98–111)
Creatinine: 1.45 mg/dL — ABNORMAL HIGH (ref 0.61–1.24)
GFR, Estimated: 50 mL/min — ABNORMAL LOW (ref >60)
Glucose, Bld: 234 mg/dL — ABNORMAL HIGH (ref 70–99)
Potassium: 4.5 mmol/L (ref 3.5–5.1)
Sodium: 139 mmol/L (ref 135–145)
Total Bilirubin: 0.5 mg/dL (ref 0.3–1.2)
Total Protein: 5.8 g/dL — ABNORMAL LOW (ref 6.5–8.1)

## 2022-09-09 LAB — CBC WITH DIFFERENTIAL (CANCER CENTER ONLY)
Abs Immature Granulocytes: 0 10*3/uL (ref 0.00–0.07)
Basophils Absolute: 0.1 10*3/uL (ref 0.0–0.1)
Basophils Relative: 3 %
Eosinophils Absolute: 0.1 10*3/uL (ref 0.0–0.5)
Eosinophils Relative: 4 %
HCT: 31.1 % — ABNORMAL LOW (ref 39.0–52.0)
Hemoglobin: 10.6 g/dL — ABNORMAL LOW (ref 13.0–17.0)
Immature Granulocytes: 0 %
Lymphocytes Relative: 35 %
Lymphs Abs: 0.8 10*3/uL (ref 0.7–4.0)
MCH: 31.4 pg (ref 26.0–34.0)
MCHC: 34.1 g/dL (ref 30.0–36.0)
MCV: 92 fL (ref 80.0–100.0)
Monocytes Absolute: 0.4 10*3/uL (ref 0.1–1.0)
Monocytes Relative: 19 %
Neutro Abs: 0.9 10*3/uL — ABNORMAL LOW (ref 1.7–7.7)
Neutrophils Relative %: 39 %
Platelet Count: 128 10*3/uL — ABNORMAL LOW (ref 150–400)
RBC: 3.38 MIL/uL — ABNORMAL LOW (ref 4.22–5.81)
RDW: 15.9 % — ABNORMAL HIGH (ref 11.5–15.5)
WBC Count: 2.2 10*3/uL — ABNORMAL LOW (ref 4.0–10.5)
nRBC: 0 % (ref 0.0–0.2)

## 2022-09-09 LAB — LACTATE DEHYDROGENASE: LDH: 166 U/L (ref 98–192)

## 2022-09-09 MED ORDER — DEXAMETHASONE 4 MG PO TABS
20.0000 mg | ORAL_TABLET | Freq: Once | ORAL | Status: AC
  Administered 2022-09-09: 20 mg via ORAL
  Filled 2022-09-09: qty 5

## 2022-09-09 MED ORDER — LORAZEPAM 1 MG PO TABS
0.5000 mg | ORAL_TABLET | Freq: Once | ORAL | Status: AC
  Administered 2022-09-09: 0.5 mg via ORAL
  Filled 2022-09-09: qty 1

## 2022-09-09 MED ORDER — ACETAMINOPHEN 325 MG PO TABS
650.0000 mg | ORAL_TABLET | Freq: Once | ORAL | Status: AC
  Administered 2022-09-09: 650 mg via ORAL
  Filled 2022-09-09: qty 2

## 2022-09-09 MED ORDER — DARATUMUMAB-HYALURONIDASE-FIHJ 1800-30000 MG-UT/15ML ~~LOC~~ SOLN
1800.0000 mg | Freq: Once | SUBCUTANEOUS | Status: AC
  Administered 2022-09-09: 1800 mg via SUBCUTANEOUS
  Filled 2022-09-09: qty 15

## 2022-09-09 MED ORDER — DIPHENHYDRAMINE HCL 25 MG PO CAPS
50.0000 mg | ORAL_CAPSULE | Freq: Once | ORAL | Status: AC
  Administered 2022-09-09: 50 mg via ORAL
  Filled 2022-09-09: qty 2

## 2022-09-09 MED ORDER — BORTEZOMIB CHEMO SQ INJECTION 3.5 MG (2.5MG/ML)
1.3000 mg/m2 | Freq: Once | INTRAMUSCULAR | Status: AC
  Administered 2022-09-09: 2.5 mg via SUBCUTANEOUS
  Filled 2022-09-09: qty 1

## 2022-09-09 NOTE — Progress Notes (Signed)
Hematology and Oncology Follow Up Visit  Marvin Salinas [id number] 31-Dec-1945 77 y.o. 09/09/2022   Principle Diagnosis:  Lambda light chain myeloma --normal cytogenetics   Current Therapy:        Faspro/Velcade/Revlimid -- s/p cycle #13 -- start on 11/05/2021 - monthly    Interim History:  Marvin Salinas is here today for follow-up and treatment.  Today he states having some numbness and pain down the right neck shoulder.  It sounds that he may have a pinched nerve.  We will have to see about doing an MRI to see what might be going on.  He otherwise seems to be doing okay.  His white cell count is little on the low side.  I told him to hold off taking the Revlimid for another week.  This may help.  When we last saw him, his lambda light chain was 2.8 mg/dL.  This continues to slowly drop.  He did have a 24-hour urine.  The free lambda light chain was 16.7mg /dL  He has had no problems with fever.  He has had no change in bowel or bladder habits.  He has had no bleeding.  There has been no cough or shortness of breath.  Has had no cardiac difficulty.  Overall, I was his performance status is probably ECOG 1.    Medications:  Allergies as of 09/09/2022       Reactions   Codeine Nausea And Vomiting   Lisinopril Cough   Nsaids Other (See Comments)   CKD   Dapagliflozin Rash   Latex Hives        Medication List        Accurate as of September 09, 2022  8:52 AM. If you have any questions, ask your nurse or doctor.          STOP taking these medications    fluticasone 50 MCG/ACT nasal spray Commonly known as: FLONASE Stopped by: Volanda Napoleon, MD   methocarbamol 500 MG tablet Commonly known as: ROBAXIN Stopped by: Volanda Napoleon, MD       TAKE these medications    apixaban 5 MG Tabs tablet Commonly known as: Eliquis Take 1 tablet (5 mg total) by mouth 2 (two) times daily.   blood glucose meter kit and supplies Kit Dispense based on patient and insurance  preference. Use up to four times daily as directed.   dofetilide 125 MCG capsule Commonly known as: TIKOSYN Take 1 capsule (125 mcg total) by mouth 2 (two) times daily.   ezetimibe 10 MG tablet Commonly known as: ZETIA Take 1 tablet (10 mg total) by mouth daily.   famciclovir 250 MG tablet Commonly known as: FAMVIR Take 1 tablet (250 mg total) by mouth daily.   GENTEAL OP Place 1 drop into both eyes daily as needed (dry eyes).   insulin glargine 100 UNIT/ML Solostar Pen Commonly known as: LANTUS Inject 35 Units into the skin 2 (two) times daily. What changed:  how much to take additional instructions   lenalidomide 10 MG capsule Commonly known as: REVLIMID Take 1 capsule (10 mg total) by mouth daily. [employer] Auth [id number] 21 days on and 7 days off.   loratadine 10 MG tablet Commonly known as: CLARITIN Take 10 mg by mouth daily as needed for allergies.   metoprolol tartrate 25 MG tablet Commonly known as: LOPRESSOR Take 0.5 tablets (12.5 mg total) by mouth 2 (two) times daily.   nitroGLYCERIN 0.4 MG SL tablet Commonly known as: NITROSTAT Place 0.4  mg under the tongue every 5 (five) minutes as needed for chest pain.   olmesartan 40 MG tablet Commonly known as: BENICAR Take 10 mg by mouth 2 (two) times daily.   ondansetron 8 MG tablet Commonly known as: ZOFRAN Take 1 tablet (8 mg total) by mouth every 8 (eight) hours as needed for nausea or vomiting.   OneTouch Delica Plus 0000000 Misc USE UP TO FOUR TIMES DAILY AS DIRECTED   OneTouch Verio test strip Generic drug: glucose blood USE UP TO FOUR TIMES DAILY AS DIRECTED.   Pen Needles 31G X 6 MM Misc 1 Application by Does not apply route at bedtime.   polyethylene glycol powder 17 GM/SCOOP powder Commonly known as: GLYCOLAX/MIRALAX Take by mouth every other day.   rosuvastatin 20 MG tablet Commonly known as: CRESTOR Take 1 tablet (20 mg total) by mouth at bedtime.   Rybelsus 7 MG Tabs Generic drug:  Semaglutide TAKE 1 TABLET BY MOUTH EVERY DAY WITH BREAKFAST        Allergies:  Allergies  Allergen Reactions   Codeine Nausea And Vomiting   Lisinopril Cough   Nsaids Other (See Comments)    CKD   Dapagliflozin Rash   Latex Hives    Past Medical History, Surgical history, Social history, and Family History were reviewed and updated.  Review of Systems: Review of Systems  Constitutional: Negative.   HENT: Negative.    Eyes: Negative.   Respiratory: Negative.    Cardiovascular: Negative.   Gastrointestinal: Negative.   Genitourinary: Negative.   Musculoskeletal:  Positive for back pain.  Skin: Negative.   Neurological:  Positive for tingling.  Endo/Heme/Allergies: Negative.   Psychiatric/Behavioral: Negative.       Physical Exam:  height is 5\' 8"  (1.727 m) and weight is 163 lb (73.9 kg). His oral temperature is 97.9 F (36.6 C). His blood pressure is 113/44 (abnormal) and his pulse is 56 (abnormal). His respiration is 20 and oxygen saturation is 98%.   Wt Readings from Last 3 Encounters:  09/09/22 163 lb (73.9 kg)  08/27/22 165 lb (74.8 kg)  08/12/22 162 lb 12.8 oz (73.8 kg)    Physical Exam Vitals reviewed.  HENT:     Head: Normocephalic and atraumatic.  Eyes:     Pupils: Pupils are equal, round, and reactive to light.  Cardiovascular:     Rate and Rhythm: Normal rate and regular rhythm.     Heart sounds: Normal heart sounds.  Pulmonary:     Effort: Pulmonary effort is normal.     Breath sounds: Normal breath sounds.  Abdominal:     General: Bowel sounds are normal.     Palpations: Abdomen is soft.  Musculoskeletal:        General: No tenderness or deformity. Normal range of motion.     Cervical back: Normal range of motion.  Lymphadenopathy:     Cervical: No cervical adenopathy.  Skin:    General: Skin is warm and dry.     Findings: No erythema or rash.  Neurological:     Mental Status: He is alert and oriented to person, place, and time.   Psychiatric:        Behavior: Behavior normal.        Thought Content: Thought content normal.        Judgment: Judgment normal.      Lab Results  Component Value Date   WBC 2.2 (L) 09/09/2022   HGB 10.6 (L) 09/09/2022   HCT 31.1 (L) 09/09/2022  MCV 92.0 09/09/2022   PLT 128 (L) 09/09/2022   Lab Results  Component Value Date   FERRITIN 45 10/28/2021   IRON 200 (H) 10/28/2021   TIBC 365 10/28/2021   UIBC 165 10/28/2021   IRONPCTSAT 55 (H) 10/28/2021   Lab Results  Component Value Date   RETICCTPCT 1.5 02/04/2021   RBC 3.38 (L) 09/09/2022   Lab Results  Component Value Date   KPAFRELGTCHN 26.4 (H) 08/12/2022   LAMBDASER 27.9 (H) 08/12/2022   KAPLAMBRATIO 4.17 08/19/2022   Lab Results  Component Value Date   IGGSERUM 320 (L) 08/12/2022   IGA 40 (L) 08/12/2022   IGMSERUM 8 (L) 08/12/2022   Lab Results  Component Value Date   TOTALPROTELP 5.7 (L) 08/12/2022   ALBUMINELP 3.4 08/12/2022   A1GS 0.3 08/12/2022   A2GS 0.9 08/12/2022   BETS 0.9 08/12/2022   GAMS 0.2 (L) 08/12/2022   MSPIKE Not Observed 08/12/2022   SPEI Comment 07/16/2022     Chemistry      Component Value Date/Time   NA 138 08/19/2022 0820   NA 140 08/31/2021 1026   K 4.2 08/19/2022 0820   CL 102 08/19/2022 0820   CO2 26 08/19/2022 0820   BUN 22 08/19/2022 0820   BUN 21 08/31/2021 1026   CREATININE 1.51 (H) 08/19/2022 0820      Component Value Date/Time   CALCIUM 9.2 08/19/2022 0820   ALKPHOS 53 08/19/2022 0820   AST 18 08/19/2022 0820   ALT 78 (H) 08/19/2022 0820   BILITOT 0.6 08/19/2022 0820       Impression and Plan: Marvin Salinas is a pleasant 77 yo caucasian gentleman with lambda light chains myeloma with mild renal insufficiency.  I think he is doing pretty well.  Unfortunately, we do not have his labs back yet.  When is he was renal function is.  Again, he is going to hold the Revlimid for a extra week.  We will have to get an MRI of his neck to see was going on up there.   Hopefully this is not reflective of myeloma.  We will continue to treat him.  We are doing Velcade every couple weeks.  I will see him back in 1 month.  His renal function has been holding relatively stable.  We will have him do a 24-hour urine today.  He will bring this back in a couple weeks.  We will go ahead and give him the Faspro today.  I will hold on the Velcade.  He does have the Revlimid but this is at a low dose.  I think that we can probably move the Velcade to every 2 weeks now.  He seems to be doing quite well.  Maybe, this will help with his blood counts.  I will plan to see him back in another couple weeks.     Volanda Napoleon, MD 3/21/20248:52 AM

## 2022-09-09 NOTE — Patient Instructions (Signed)
[facility]  Discharge Instructions: Thank you for choosing [facility] to provide your oncology and hematology care.   If you have a lab appointment with the Cancer Center, please go directly to the Cancer Center and check in at the registration area.  Wear comfortable clothing and clothing appropriate for easy access to any Portacath or PICC line.   We strive to give you quality time with your provider. You may need to reschedule your appointment if you arrive late (15 or more minutes).  Arriving late affects you and other patients whose appointments are after yours.  Also, if you miss three or more appointments without notifying the office, you may be dismissed from the clinic at the provider's discretion.      For prescription refill requests, have your pharmacy contact our office and allow 72 hours for refills to be completed.    Today you received the following chemotherapy and/or immunotherapy agents Velcade, Darzalex.      To help prevent nausea and vomiting after your treatment, we encourage you to take your nausea medication as directed.  BELOW ARE SYMPTOMS THAT SHOULD BE REPORTED IMMEDIATELY: *FEVER GREATER THAN 100.4 F (38 C) OR HIGHER *CHILLS OR SWEATING *NAUSEA AND VOMITING THAT IS NOT CONTROLLED WITH YOUR NAUSEA MEDICATION *UNUSUAL SHORTNESS OF BREATH *UNUSUAL BRUISING OR BLEEDING *URINARY PROBLEMS (pain or burning when urinating, or frequent urination) *BOWEL PROBLEMS (unusual diarrhea, constipation, pain near the anus) TENDERNESS IN MOUTH AND THROAT WITH OR WITHOUT PRESENCE OF ULCERS (sore throat, sores in mouth, or a toothache) UNUSUAL RASH, SWELLING OR PAIN  UNUSUAL VAGINAL DISCHARGE OR ITCHING   Items with * indicate a potential emergency and should be followed up as soon as possible or go to the Emergency Department if any problems should occur.  Please show the CHEMOTHERAPY ALERT CARD or IMMUNOTHERAPY ALERT CARD  at check-in to the Emergency Department and triage nurse. Should you have questions after your visit or need to cancel or reschedule your appointment, please contact [facility]  [phone] and follow the prompts.  Office hours are 8:00 a.m. to 4:30 p.m. Monday - Friday. Please note that voicemails left after 4:00 p.m. may not be returned until the following business day.  We are closed weekends and major holidays. You have access to a nurse at all times for urgent questions. Please call the main number to the clinic [phone] and follow the prompts.  For any non-urgent questions, you may also contact your provider using MyChart. We now offer e-Visits for anyone 18 and older to request care online for non-urgent symptoms. For details visit [email or web address].   Also download the MyChart app! Go to the app store, search "MyChart", open the app, select [facility], and log in with your MyChart username and password.   

## 2022-09-09 NOTE — Progress Notes (Signed)
OK to treat with today's ANC level from today per Dr. Marin Olp. Patient refused to wait 1 hour post Darzalex. Released stable and ASX.

## 2022-09-10 LAB — KAPPA/LAMBDA LIGHT CHAINS
Kappa free light chain: 26.3 mg/L — ABNORMAL HIGH (ref 3.3–19.4)
Kappa, lambda light chain ratio: 1.14 (ref 0.26–1.65)
Lambda free light chains: 23 mg/L (ref 5.7–26.3)

## 2022-09-11 LAB — IGG, IGA, IGM
IgA: 49 mg/dL — ABNORMAL LOW (ref 61–437)
IgG (Immunoglobin G), Serum: 312 mg/dL — ABNORMAL LOW (ref 603–1613)
IgM (Immunoglobulin M), Srm: 9 mg/dL — ABNORMAL LOW (ref 15–143)

## 2022-09-13 LAB — PROTEIN ELECTROPHORESIS, SERUM, WITH REFLEX
A/G Ratio: 1.9 — ABNORMAL HIGH (ref 0.7–1.7)
Albumin ELP: 3.6 g/dL (ref 2.9–4.4)
Alpha-1-Globulin: 0.2 g/dL (ref 0.0–0.4)
Alpha-2-Globulin: 0.6 g/dL (ref 0.4–1.0)
Beta Globulin: 0.8 g/dL (ref 0.7–1.3)
Gamma Globulin: 0.3 g/dL — ABNORMAL LOW (ref 0.4–1.8)
Globulin, Total: 1.9 g/dL — ABNORMAL LOW (ref 2.2–3.9)
Total Protein ELP: 5.5 g/dL — ABNORMAL LOW (ref 6.0–8.5)

## 2022-09-23 ENCOUNTER — Inpatient Hospital Stay: Payer: 59

## 2022-09-23 ENCOUNTER — Inpatient Hospital Stay: Payer: 59 | Attending: Family

## 2022-09-23 VITALS — BP 156/56 | HR 52 | Temp 97.8°F | Resp 17

## 2022-09-23 DIAGNOSIS — Z79899 Other long term (current) drug therapy: Secondary | ICD-10-CM | POA: Insufficient documentation

## 2022-09-23 DIAGNOSIS — C9000 Multiple myeloma not having achieved remission: Secondary | ICD-10-CM | POA: Diagnosis not present

## 2022-09-23 DIAGNOSIS — Z5112 Encounter for antineoplastic immunotherapy: Secondary | ICD-10-CM | POA: Insufficient documentation

## 2022-09-23 LAB — CBC WITH DIFFERENTIAL (CANCER CENTER ONLY)
Abs Immature Granulocytes: 0.01 10*3/uL (ref 0.00–0.07)
Basophils Absolute: 0 10*3/uL (ref 0.0–0.1)
Basophils Relative: 1 %
Eosinophils Absolute: 0.1 10*3/uL (ref 0.0–0.5)
Eosinophils Relative: 1 %
HCT: 31.9 % — ABNORMAL LOW (ref 39.0–52.0)
Hemoglobin: 10.6 g/dL — ABNORMAL LOW (ref 13.0–17.0)
Immature Granulocytes: 0 %
Lymphocytes Relative: 21 %
Lymphs Abs: 1 10*3/uL (ref 0.7–4.0)
MCH: 30.5 pg (ref 26.0–34.0)
MCHC: 33.2 g/dL (ref 30.0–36.0)
MCV: 91.9 fL (ref 80.0–100.0)
Monocytes Absolute: 0.5 10*3/uL (ref 0.1–1.0)
Monocytes Relative: 9 %
Neutro Abs: 3.3 10*3/uL (ref 1.7–7.7)
Neutrophils Relative %: 68 %
Platelet Count: 113 10*3/uL — ABNORMAL LOW (ref 150–400)
RBC: 3.47 MIL/uL — ABNORMAL LOW (ref 4.22–5.81)
RDW: 15.5 % (ref 11.5–15.5)
WBC Count: 4.8 10*3/uL (ref 4.0–10.5)
nRBC: 0 % (ref 0.0–0.2)

## 2022-09-23 LAB — CMP (CANCER CENTER ONLY)
ALT: 24 U/L (ref 0–44)
AST: 16 U/L (ref 15–41)
Albumin: 3.6 g/dL (ref 3.5–5.0)
Alkaline Phosphatase: 51 U/L (ref 38–126)
Anion gap: 8 (ref 5–15)
BUN: 20 mg/dL (ref 8–23)
CO2: 26 mmol/L (ref 22–32)
Calcium: 9 mg/dL (ref 8.9–10.3)
Chloride: 103 mmol/L (ref 98–111)
Creatinine: 1.33 mg/dL — ABNORMAL HIGH (ref 0.61–1.24)
GFR, Estimated: 55 mL/min — ABNORMAL LOW (ref >60)
Glucose, Bld: 170 mg/dL — ABNORMAL HIGH (ref 70–99)
Potassium: 4.6 mmol/L (ref 3.5–5.1)
Sodium: 137 mmol/L (ref 135–145)
Total Bilirubin: 0.6 mg/dL (ref 0.3–1.2)
Total Protein: 6 g/dL — ABNORMAL LOW (ref 6.5–8.1)

## 2022-09-23 MED ORDER — PROCHLORPERAZINE MALEATE 10 MG PO TABS
10.0000 mg | ORAL_TABLET | Freq: Once | ORAL | Status: AC
  Administered 2022-09-23: 10 mg via ORAL
  Filled 2022-09-23: qty 1

## 2022-09-23 MED ORDER — LORAZEPAM 1 MG PO TABS
0.5000 mg | ORAL_TABLET | Freq: Once | ORAL | Status: AC
  Administered 2022-09-23: 0.5 mg via ORAL
  Filled 2022-09-23: qty 1

## 2022-09-23 MED ORDER — BORTEZOMIB CHEMO SQ INJECTION 3.5 MG (2.5MG/ML)
1.3000 mg/m2 | Freq: Once | INTRAMUSCULAR | Status: AC
  Administered 2022-09-23: 2.5 mg via SUBCUTANEOUS
  Filled 2022-09-23: qty 1

## 2022-09-23 NOTE — Patient Instructions (Signed)
[facility]  Discharge Instructions: Thank you for choosing [facility] to provide your oncology and hematology care.   If you have a lab appointment with the Cancer Center, please go directly to the Cancer Center and check in at the registration area.  Wear comfortable clothing and clothing appropriate for easy access to any Portacath or PICC line.   We strive to give you quality time with your provider. You may need to reschedule your appointment if you arrive late (15 or more minutes).  Arriving late affects you and other patients whose appointments are after yours.  Also, if you miss three or more appointments without notifying the office, you may be dismissed from the clinic at the provider's discretion.      For prescription refill requests, have your pharmacy contact our office and allow 72 hours for refills to be completed.    Today you received the following chemotherapy and/or immunotherapy agents Velcade.      To help prevent nausea and vomiting after your treatment, we encourage you to take your nausea medication as directed.  BELOW ARE SYMPTOMS THAT SHOULD BE REPORTED IMMEDIATELY: *FEVER GREATER THAN 100.4 F (38 C) OR HIGHER *CHILLS OR SWEATING *NAUSEA AND VOMITING THAT IS NOT CONTROLLED WITH YOUR NAUSEA MEDICATION *UNUSUAL SHORTNESS OF BREATH *UNUSUAL BRUISING OR BLEEDING *URINARY PROBLEMS (pain or burning when urinating, or frequent urination) *BOWEL PROBLEMS (unusual diarrhea, constipation, pain near the anus) TENDERNESS IN MOUTH AND THROAT WITH OR WITHOUT PRESENCE OF ULCERS (sore throat, sores in mouth, or a toothache) UNUSUAL RASH, SWELLING OR PAIN  UNUSUAL VAGINAL DISCHARGE OR ITCHING   Items with * indicate a potential emergency and should be followed up as soon as possible or go to the Emergency Department if any problems should occur.  Please show the CHEMOTHERAPY ALERT CARD or IMMUNOTHERAPY ALERT CARD at check-in  to the Emergency Department and triage nurse. Should you have questions after your visit or need to cancel or reschedule your appointment, please contact [facility]  [phone] and follow the prompts.  Office hours are 8:00 a.m. to 4:30 p.m. Monday - Friday. Please note that voicemails left after 4:00 p.m. may not be returned until the following business day.  We are closed weekends and major holidays. You have access to a nurse at all times for urgent questions. Please call the main number to the clinic [phone] and follow the prompts.  For any non-urgent questions, you may also contact your provider using MyChart. We now offer e-Visits for anyone 18 and older to request care online for non-urgent symptoms. For details visit [email or web address].   Also download the MyChart app! Go to the app store, search "MyChart", open the app, select [facility], and log in with your MyChart username and password.   

## 2022-09-29 ENCOUNTER — Other Ambulatory Visit: Payer: Self-pay | Admitting: *Deleted

## 2022-09-29 DIAGNOSIS — C9000 Multiple myeloma not having achieved remission: Secondary | ICD-10-CM

## 2022-09-29 MED ORDER — LENALIDOMIDE 10 MG PO CAPS
10.0000 mg | ORAL_CAPSULE | Freq: Every day | ORAL | 0 refills | Status: DC
Start: 2022-09-29 — End: 2023-01-14

## 2022-10-06 ENCOUNTER — Ambulatory Visit: Payer: 59 | Admitting: Family Medicine

## 2022-10-08 ENCOUNTER — Inpatient Hospital Stay: Payer: 59

## 2022-10-08 ENCOUNTER — Inpatient Hospital Stay (HOSPITAL_BASED_OUTPATIENT_CLINIC_OR_DEPARTMENT_OTHER): Payer: 59 | Admitting: Family

## 2022-10-08 VITALS — BP 114/50 | HR 67 | Temp 97.8°F | Resp 18 | Wt 162.8 lb

## 2022-10-08 DIAGNOSIS — C9000 Multiple myeloma not having achieved remission: Secondary | ICD-10-CM

## 2022-10-08 DIAGNOSIS — Z5112 Encounter for antineoplastic immunotherapy: Secondary | ICD-10-CM | POA: Diagnosis not present

## 2022-10-08 DIAGNOSIS — Z79899 Other long term (current) drug therapy: Secondary | ICD-10-CM | POA: Diagnosis not present

## 2022-10-08 LAB — CBC WITH DIFFERENTIAL (CANCER CENTER ONLY)
Abs Immature Granulocytes: 0.01 10*3/uL (ref 0.00–0.07)
Basophils Absolute: 0 10*3/uL (ref 0.0–0.1)
Basophils Relative: 0 %
Eosinophils Absolute: 0.1 10*3/uL (ref 0.0–0.5)
Eosinophils Relative: 2 %
HCT: 34.7 % — ABNORMAL LOW (ref 39.0–52.0)
Hemoglobin: 11.7 g/dL — ABNORMAL LOW (ref 13.0–17.0)
Immature Granulocytes: 0 %
Lymphocytes Relative: 33 %
Lymphs Abs: 1.1 10*3/uL (ref 0.7–4.0)
MCH: 31.1 pg (ref 26.0–34.0)
MCHC: 33.7 g/dL (ref 30.0–36.0)
MCV: 92.3 fL (ref 80.0–100.0)
Monocytes Absolute: 0.4 10*3/uL (ref 0.1–1.0)
Monocytes Relative: 11 %
Neutro Abs: 1.9 10*3/uL (ref 1.7–7.7)
Neutrophils Relative %: 54 %
Platelet Count: 168 10*3/uL (ref 150–400)
RBC: 3.76 MIL/uL — ABNORMAL LOW (ref 4.22–5.81)
RDW: 15 % (ref 11.5–15.5)
WBC Count: 3.4 10*3/uL — ABNORMAL LOW (ref 4.0–10.5)
nRBC: 0 % (ref 0.0–0.2)

## 2022-10-08 LAB — CMP (CANCER CENTER ONLY)
ALT: 34 U/L (ref 0–44)
AST: 28 U/L (ref 15–41)
Albumin: 3.9 g/dL (ref 3.5–5.0)
Alkaline Phosphatase: 67 U/L (ref 38–126)
Anion gap: 9 (ref 5–15)
BUN: 32 mg/dL — ABNORMAL HIGH (ref 8–23)
CO2: 26 mmol/L (ref 22–32)
Calcium: 9.1 mg/dL (ref 8.9–10.3)
Chloride: 107 mmol/L (ref 98–111)
Creatinine: 1.59 mg/dL — ABNORMAL HIGH (ref 0.61–1.24)
GFR, Estimated: 44 mL/min — ABNORMAL LOW (ref >60)
Glucose, Bld: 129 mg/dL — ABNORMAL HIGH (ref 70–99)
Potassium: 4.6 mmol/L (ref 3.5–5.1)
Sodium: 142 mmol/L (ref 135–145)
Total Bilirubin: 0.4 mg/dL (ref 0.3–1.2)
Total Protein: 5.9 g/dL — ABNORMAL LOW (ref 6.5–8.1)

## 2022-10-08 LAB — LACTATE DEHYDROGENASE: LDH: 145 U/L (ref 98–192)

## 2022-10-08 MED ORDER — DARATUMUMAB-HYALURONIDASE-FIHJ 1800-30000 MG-UT/15ML ~~LOC~~ SOLN
1800.0000 mg | Freq: Once | SUBCUTANEOUS | Status: AC
  Administered 2022-10-08: 1800 mg via SUBCUTANEOUS
  Filled 2022-10-08: qty 15

## 2022-10-08 MED ORDER — DEXAMETHASONE 4 MG PO TABS
20.0000 mg | ORAL_TABLET | Freq: Once | ORAL | Status: AC
  Administered 2022-10-08: 20 mg via ORAL
  Filled 2022-10-08: qty 5

## 2022-10-08 MED ORDER — BORTEZOMIB CHEMO SQ INJECTION 3.5 MG (2.5MG/ML)
1.3000 mg/m2 | Freq: Once | INTRAMUSCULAR | Status: AC
  Administered 2022-10-08: 2.5 mg via SUBCUTANEOUS
  Filled 2022-10-08: qty 1

## 2022-10-08 MED ORDER — ACETAMINOPHEN 325 MG PO TABS
650.0000 mg | ORAL_TABLET | Freq: Once | ORAL | Status: AC
  Administered 2022-10-08: 650 mg via ORAL
  Filled 2022-10-08: qty 2

## 2022-10-08 MED ORDER — LORAZEPAM 1 MG PO TABS
0.5000 mg | ORAL_TABLET | Freq: Once | ORAL | Status: AC
  Administered 2022-10-08: 0.5 mg via ORAL
  Filled 2022-10-08: qty 1

## 2022-10-08 MED ORDER — DIPHENHYDRAMINE HCL 25 MG PO CAPS
50.0000 mg | ORAL_CAPSULE | Freq: Once | ORAL | Status: AC
  Administered 2022-10-08: 50 mg via ORAL
  Filled 2022-10-08: qty 2

## 2022-10-08 NOTE — Progress Notes (Signed)
Labs reviewed with Dr. Myna Hidalgo and OK to treat with a Creatinine of 1.59. Infusion room nurse notified.

## 2022-10-08 NOTE — Progress Notes (Signed)
Hematology and Oncology Follow Up Visit  Marvin Salinas [id number] 1945-12-02 77 y.o. 10/08/2022   Principle Diagnosis:  Lambda light chain myeloma --normal cytogenetics   Current Therapy:        Faspro/Velcade/Revlimid -- s/p cycle 13 -- start on 11/05/2021 - monthly    Interim History:  Marvin Salinas is here today for follow-up and treatment. He is doing fairly well. He has generalized aches and pain in his "bones".  Neuropathy in the hands and feet is unchanged from baseline. No falls or syncope reported.  He ambulates with a cane for added support.  No M-spike noted in March. IgG level was 312 mg/dL and lambda light chains 2.30 mg/dL.  No fever, chills, n/v, cough, rash, dizziness, SOB, chest pain, palpitations or changes in bowel or bladder habits.  Abdominal pain and occasional diarrhea improved since holding Revlimid.  Appetite and hydration are good.   ECOG Performance Status: 1 - Symptomatic but completely ambulatory  Medications:  Allergies as of 10/08/2022       Reactions   Codeine Nausea And Vomiting   Lisinopril Cough   Nsaids Other (See Comments)   CKD   Dapagliflozin Rash   Latex Hives        Medication List        Accurate as of October 08, 2022 10:15 AM. If you have any questions, ask your nurse or doctor.          apixaban 5 MG Tabs tablet Commonly known as: Eliquis Take 1 tablet (5 mg total) by mouth 2 (two) times daily.   blood glucose meter kit and supplies Kit Dispense based on patient and insurance preference. Use up to four times daily as directed.   dofetilide 125 MCG capsule Commonly known as: TIKOSYN Take 1 capsule (125 mcg total) by mouth 2 (two) times daily.   ezetimibe 10 MG tablet Commonly known as: ZETIA Take 1 tablet (10 mg total) by mouth daily.   famciclovir 250 MG tablet Commonly known as: FAMVIR Take 1 tablet (250 mg total) by mouth daily.   GENTEAL OP Place 1 drop into both eyes daily as needed (dry eyes).   insulin  glargine 100 UNIT/ML Solostar Pen Commonly known as: LANTUS Inject 35 Units into the skin 2 (two) times daily. What changed:  how much to take additional instructions   lenalidomide 10 MG capsule Commonly known as: REVLIMID Take 1 capsule (10 mg total) by mouth daily. [employer] Auth #[id number].  21 days on and 7 days off.   loratadine 10 MG tablet Commonly known as: CLARITIN Take 10 mg by mouth daily as needed for allergies.   metoprolol tartrate 25 MG tablet Commonly known as: LOPRESSOR Take 0.5 tablets (12.5 mg total) by mouth 2 (two) times daily.   nitroGLYCERIN 0.4 MG SL tablet Commonly known as: NITROSTAT Place 0.4 mg under the tongue every 5 (five) minutes as needed for chest pain.   olmesartan 40 MG tablet Commonly known as: BENICAR Take 10 mg by mouth 2 (two) times daily.   ondansetron 8 MG tablet Commonly known as: ZOFRAN Take 1 tablet (8 mg total) by mouth every 8 (eight) hours as needed for nausea or vomiting.   OneTouch Delica Plus Lancet33G Misc USE UP TO FOUR TIMES DAILY AS DIRECTED   OneTouch Verio test strip Generic drug: glucose blood USE UP TO FOUR TIMES DAILY AS DIRECTED.   Pen Needles 31G X 6 MM Misc 1 Application by Does not apply route at bedtime.  polyethylene glycol powder 17 GM/SCOOP powder Commonly known as: GLYCOLAX/MIRALAX Take by mouth every other day.   rosuvastatin 20 MG tablet Commonly known as: CRESTOR Take 1 tablet (20 mg total) by mouth at bedtime.   Rybelsus 7 MG Tabs Generic drug: Semaglutide TAKE 1 TABLET BY MOUTH EVERY DAY WITH BREAKFAST        Allergies:  Allergies  Allergen Reactions   Codeine Nausea And Vomiting   Lisinopril Cough   Nsaids Other (See Comments)    CKD   Dapagliflozin Rash   Latex Hives    Past Medical History, Surgical history, Social history, and Family History were reviewed and updated.  Review of Systems: All other 10 point review of systems is negative.   Physical Exam:  vitals were  not taken for this visit.   Wt Readings from Last 3 Encounters:  09/09/22 163 lb (73.9 kg)  08/27/22 165 lb (74.8 kg)  08/12/22 162 lb 12.8 oz (73.8 kg)    Ocular: Sclerae unicteric, pupils equal, round and reactive to light Ear-nose-throat: Oropharynx clear, dentition fair Lymphatic: No cervical or supraclavicular adenopathy Lungs no rales or rhonchi, good excursion bilaterally Heart regular rate and rhythm, no murmur appreciated Abd soft, nontender, positive bowel sounds MSK no focal spinal tenderness, no joint edema Neuro: non-focal, well-oriented, appropriate affect Breasts: Deferred   Lab Results  Component Value Date   WBC 3.4 (L) 10/08/2022   HGB 11.7 (L) 10/08/2022   HCT 34.7 (L) 10/08/2022   MCV 92.3 10/08/2022   PLT 168 10/08/2022   Lab Results  Component Value Date   FERRITIN 45 10/28/2021   IRON 200 (H) 10/28/2021   TIBC 365 10/28/2021   UIBC 165 10/28/2021   IRONPCTSAT 55 (H) 10/28/2021   Lab Results  Component Value Date   RETICCTPCT 1.5 02/04/2021   RBC 3.76 (L) 10/08/2022   Lab Results  Component Value Date   KPAFRELGTCHN 26.3 (H) 09/09/2022   LAMBDASER 23.0 09/09/2022   KAPLAMBRATIO 1.14 09/09/2022   Lab Results  Component Value Date   IGGSERUM 312 (L) 09/09/2022   IGA 49 (L) 09/09/2022   IGMSERUM 9 (L) 09/09/2022   Lab Results  Component Value Date   TOTALPROTELP 5.5 (L) 09/09/2022   ALBUMINELP 3.6 09/09/2022   A1GS 0.2 09/09/2022   A2GS 0.6 09/09/2022   BETS 0.8 09/09/2022   GAMS 0.3 (L) 09/09/2022   MSPIKE Not Observed 09/09/2022   SPEI Comment 07/16/2022     Chemistry      Component Value Date/Time   NA 137 09/23/2022 1031   NA 140 08/31/2021 1026   K 4.6 09/23/2022 1031   CL 103 09/23/2022 1031   CO2 26 09/23/2022 1031   BUN 20 09/23/2022 1031   BUN 21 08/31/2021 1026   CREATININE 1.33 (H) 09/23/2022 1031      Component Value Date/Time   CALCIUM 9.0 09/23/2022 1031   ALKPHOS 51 09/23/2022 1031   AST 16 09/23/2022 1031    ALT 24 09/23/2022 1031   BILITOT 0.6 09/23/2022 1031       Impression and Plan: Marvin Salinas is a pleasant 77 yo caucasian gentleman with lambda light chains myeloma with mild renal insufficiency  Serum protein studies are pending.  We will get a 24 hr urine on him. He will return to [place] next week.  Treatment every 2 weeks, follow-up in 4 weeks.   Eileen Stanford, NP 4/19/202410:15 AM

## 2022-10-08 NOTE — Progress Notes (Signed)
Reviewed pt labs with Dr. Myna Hidalgo and pt ok to treat with creatinine 1.59

## 2022-10-08 NOTE — Patient Instructions (Signed)
[facility]  Discharge Instructions: Thank you for choosing [facility] to provide your oncology and hematology care.   If you have a lab appointment with the Cancer Center, please go directly to the Cancer Center and check in at the registration area.  Wear comfortable clothing and clothing appropriate for easy access to any Portacath or PICC line.   We strive to give you quality time with your provider. You may need to reschedule your appointment if you arrive late (15 or more minutes).  Arriving late affects you and other patients whose appointments are after yours.  Also, if you miss three or more appointments without notifying the office, you may be dismissed from the clinic at the provider's discretion.      For prescription refill requests, have your pharmacy contact our office and allow 72 hours for refills to be completed.    Today you received the following chemotherapy and/or immunotherapy agents Velcade and Faspro   To help prevent nausea and vomiting after your treatment, we encourage you to take your nausea medication as directed.  BELOW ARE SYMPTOMS THAT SHOULD BE REPORTED IMMEDIATELY: *FEVER GREATER THAN 100.4 F (38 C) OR HIGHER *CHILLS OR SWEATING *NAUSEA AND VOMITING THAT IS NOT CONTROLLED WITH YOUR NAUSEA MEDICATION *UNUSUAL SHORTNESS OF BREATH *UNUSUAL BRUISING OR BLEEDING *URINARY PROBLEMS (pain or burning when urinating, or frequent urination) *BOWEL PROBLEMS (unusual diarrhea, constipation, pain near the anus) TENDERNESS IN MOUTH AND THROAT WITH OR WITHOUT PRESENCE OF ULCERS (sore throat, sores in mouth, or a toothache) UNUSUAL RASH, SWELLING OR PAIN  UNUSUAL VAGINAL DISCHARGE OR ITCHING   Items with * indicate a potential emergency and should be followed up as soon as possible or go to the Emergency Department if any problems should occur.  Please show the CHEMOTHERAPY ALERT CARD or IMMUNOTHERAPY ALERT CARD at  check-in to the Emergency Department and triage nurse. Should you have questions after your visit or need to cancel or reschedule your appointment, please contact [facility]  [phone] and follow the prompts.  Office hours are 8:00 a.m. to 4:30 p.m. Monday - Friday. Please note that voicemails left after 4:00 p.m. may not be returned until the following business day.  We are closed weekends and major holidays. You have access to a nurse at all times for urgent questions. Please call the main number to the clinic [phone] and follow the prompts.  For any non-urgent questions, you may also contact your provider using MyChart. We now offer e-Visits for anyone 7 and older to request care online for non-urgent symptoms. For details visit [email or web address].   Also download the MyChart app! Go to the app store, search "MyChart", open the app, select [facility], and log in with your MyChart username and password.

## 2022-10-10 LAB — IGG, IGA, IGM
IgA: 35 mg/dL — ABNORMAL LOW (ref 61–437)
IgG (Immunoglobin G), Serum: 300 mg/dL — ABNORMAL LOW (ref 603–1613)
IgM (Immunoglobulin M), Srm: 8 mg/dL — ABNORMAL LOW (ref 15–143)

## 2022-10-11 ENCOUNTER — Ambulatory Visit (INDEPENDENT_AMBULATORY_CARE_PROVIDER_SITE_OTHER): Payer: 59 | Admitting: Family Medicine

## 2022-10-11 ENCOUNTER — Encounter: Payer: Self-pay | Admitting: Family Medicine

## 2022-10-11 VITALS — BP 135/69 | HR 49 | Temp 97.5°F | Wt 165.8 lb

## 2022-10-11 DIAGNOSIS — N1831 Chronic kidney disease, stage 3a: Secondary | ICD-10-CM

## 2022-10-11 DIAGNOSIS — I10 Essential (primary) hypertension: Secondary | ICD-10-CM | POA: Diagnosis not present

## 2022-10-11 DIAGNOSIS — C9000 Multiple myeloma not having achieved remission: Secondary | ICD-10-CM

## 2022-10-11 DIAGNOSIS — I4891 Unspecified atrial fibrillation: Secondary | ICD-10-CM | POA: Diagnosis not present

## 2022-10-11 DIAGNOSIS — E118 Type 2 diabetes mellitus with unspecified complications: Secondary | ICD-10-CM

## 2022-10-11 DIAGNOSIS — Z794 Long term (current) use of insulin: Secondary | ICD-10-CM

## 2022-10-11 DIAGNOSIS — I779 Disorder of arteries and arterioles, unspecified: Secondary | ICD-10-CM

## 2022-10-11 DIAGNOSIS — E782 Mixed hyperlipidemia: Secondary | ICD-10-CM | POA: Diagnosis not present

## 2022-10-11 LAB — MICROALBUMIN / CREATININE URINE RATIO
Creatinine,U: 124.4 mg/dL
Microalb Creat Ratio: 9.1 mg/g (ref 0.0–30.0)
Microalb, Ur: 11.3 mg/dL — ABNORMAL HIGH (ref 0.0–1.9)

## 2022-10-11 LAB — POCT GLYCOSYLATED HEMOGLOBIN (HGB A1C)
HbA1c POC (<> result, manual entry): 8.8 % (ref 4.0–5.6)
HbA1c, POC (controlled diabetic range): 8.8 % — AB (ref 0.0–7.0)
HbA1c, POC (prediabetic range): 8.8 % — AB (ref 5.7–6.4)
Hemoglobin A1C: 8.8 % — AB (ref 4.0–5.6)

## 2022-10-11 LAB — KAPPA/LAMBDA LIGHT CHAINS
Kappa free light chain: 17.1 mg/L (ref 3.3–19.4)
Kappa, lambda light chain ratio: 0.96 (ref 0.26–1.65)
Lambda free light chains: 17.8 mg/L (ref 5.7–26.3)

## 2022-10-11 MED ORDER — RYBELSUS 14 MG PO TABS
14.0000 mg | ORAL_TABLET | Freq: Every day | ORAL | 1 refills | Status: DC
Start: 2022-10-11 — End: 2023-03-25

## 2022-10-11 MED ORDER — ROSUVASTATIN CALCIUM 20 MG PO TABS: 20.0000 mg | ORAL_TABLET | Freq: Every day | ORAL | 3 refills | Status: DC

## 2022-10-11 MED ORDER — INSULIN GLARGINE 100 UNIT/ML SOLOSTAR PEN: 40.0000 [IU] | PEN_INJECTOR | Freq: Two times a day (BID) | SUBCUTANEOUS | 1 refills | Status: DC

## 2022-10-11 MED ORDER — INSULIN PEN NEEDLE 31G X 4 MM MISC: 11 refills | Status: DC

## 2022-10-11 NOTE — Patient Instructions (Addendum)
No follow-ups on file.        Great to see you today.  I have refilled the medication(s) we provide.   If labs were collected, we will inform you of lab results once received either by echart message or telephone call.   - echart message- for normal results that have been seen by the patient already.   - telephone call: abnormal results or if patient has not viewed results in their echart.   Rybelsus increase to next dose Insulin increase to 40 units twice a day

## 2022-10-11 NOTE — Progress Notes (Signed)
Patient ID: Marvin Salinas, male  DOB: August 04, 1945, 77 y.o.   MRN: [id number] Patient Care Team    Relationship Specialty Notifications Start End  Natalia Leatherwood, DO PCP - General Family Medicine  01/24/19   Coralyn Helling, MD Consulting Physician Pulmonary Disease  01/26/19   [name], Deloris Ping, MD Consulting Physician Cardiology  04/30/20   Rachael Fee, MD Attending Physician Gastroenterology  10/23/20   Erenest Blank, NP Nurse Practitioner Nurse Practitioner  10/23/20    Comment: Hematology  Van Clines, MD Consulting Physician Neurology  09/01/21   Randa Lynn, MD Consulting Physician Nephrology  10/28/21    Comment: [facility]    Chief Complaint  Patient presents with   Diabetes    [place]    Subjective: Marvin Salinas is a 77 y.o.  male present for  [facility] Type 2 diabetes mellitus with stage 3 chronic kidney disease, with long-term current use of insulin (HCC) Pt reports compliance with Lantus 35 twice daily(usually) units , Rybelsus 7 mg daily .Marland Kitchen   He is requiring steroids prior to chemo rounds, causing A1c to increase Patient denies dizziness, hyperglycemic or hypoglycemic events. Patient denies numbness, tingling in the extremities or nonhealing wounds of feet.    Atrial fibrillation with RVR (HCC)/Essential hypertension/Ischemic heart disease/ S/P CABG x 3/Paroxysmal atrial flutter (HCC)/PAD (peripheral artery disease) (HCC)/Nonsustained ventricular tachycardia (HCC)/HLD/NSTEMI/h/o stroke/CKD 3/chronic anticoagulation Patient has a history of severe 3-vessel coronary artery disease with chronic total occlusions of the proximal/mid LAD, mid/distal left circumflex/and proximal RCA.  Moderate caliber D1 and distal RPDA demonstrate 70-80% stenosis.  Moderately elevated left ventricular filling pressure.  Coronary artery stents and CABG have been completed in the past.  He also has a history of cerebral ischemia and TIAs.  Pt reports compliance with  Crestor,  Eliquis, Benicar 40, metoprolol 12.5 mg twice daily, Zetia, tikosyn.  Patient denies chest pain, shortness of breath, dizziness or lower extremity edema.  Pt is prescribed statin. Diet: Heart healthy-low-sodium diet encouraged RF: Hypertension, hyperlipidemia, former smoker, STEMI, stroke, diabetes, family history, CAD, CVD, PAD, bilateral carotid artery stenosis, TIA       10/11/2022    8:20 AM 03/31/2022   11:12 AM 03/19/2022    2:00 PM 03/02/2022    1:05 PM 12/09/2021   10:53 AM  Depression screen PHQ 2/9  Decreased Interest 1 0 0 0 0  Down, Depressed, Hopeless 1 0 0 1 1  PHQ - 2 Score 2 0 0 1 1  Altered sleeping 1    1  Tired, decreased energy 1    2  Change in appetite 0    0  Feeling bad or failure about yourself  0    0  Trouble concentrating 0    0  Moving slowly or fidgety/restless 0    0  Suicidal thoughts 0    0  PHQ-9 Score 4    4  Difficult doing work/chores Somewhat difficult    Not difficult at all      10/28/2021    9:13 AM  GAD 7 : Generalized Anxiety Score  Nervous, Anxious, on Edge 0  Control/stop worrying 1  Worry too much - different things 1  Trouble relaxing 1  Restless 1  Easily annoyed or irritable 0  Afraid - awful might happen 0  Total GAD 7 Score 4          10/11/2022    8:20 AM 06/26/2022  9:16 AM 03/19/2022    1:59 PM 12/09/2021   10:25 AM 10/28/2021    9:09 AM  Fall Risk   Falls in the past year? 1 1 1  0 1  Number falls in past yr: 1 0 0 0 1  Injury with Fall? 0 0 0 0 0  Risk for fall due to :   History of fall(s)  Impaired vision  Follow up Falls evaluation completed  Falls evaluation completed Falls prevention discussed;Education provided;Falls evaluation completed Falls evaluation completed    Immunization History  Administered Date(s) Administered   Fluad Quad(high Dose 65+) 02/12/2019, 04/29/2020, 04/22/2021, 06/09/2022   Influenza Split 05/08/2013, 03/28/2017   Influenza, High Dose Seasonal PF 05/08/2013, 03/28/2017, 05/02/2018    Influenza,inj,Quad PF,6+ Mos 04/21/2016   MODERNA COVID-19 SARS-COV-2 PEDS BIVALENT BOOSTER 6Y-11Y 06/18/2020, 12/10/2020   Moderna Sars-Covid-2 Vaccination 08/27/2019, 10/03/2019   Pneumococcal Conjugate-13 07/13/2016   Pneumococcal Polysaccharide-23 02/21/2018   Tdap 02/15/2013   Zoster, Live 06/22/2015    No results found.  Past Medical History:  Diagnosis Date   Acute kidney injury superimposed on chronic kidney disease 10/12/2017   Atrial fibrillation with RVR 10/27/2017   Back pain    Basilar artery stenosis    on chronic Plavix   Benign prostatic hypertrophy without urinary obstruction 04/17/2014   Carotid artery disease 10/06/2010   Carotid US 04/2019: Bilat ICA 40-59; L subclavian stenosis  // Carotid US 11/21: Bilat ICA 40-59; bilateral subclavian stenosis // Carotid US 11/22: Bilateral ICA 40-59; left subclavian stenosis // Carotid US 04/28/2022: Bilateral ICA 40-59; left subclavian stenosis   Cerebral vascular disease    with prior TIA's; followed by Dr. Sandria Manly   Depression, neurotic 11/21/2013   Diabetes mellitus    on insulin   Enthesopathy of ankle and tarsus 09/04/2007   Overview:  Metatarsalgia    Enthesopathy of ankle and tarsus 09/04/2007   Overview:  Metatarsalgia  Formatting of this note might be different from the original. Metatarsalgia  10/1 IMO update   History of renal calculi    Hyperlipidemia    Hypertension    Ischemic heart disease    prior PCI to RCA in 1989. S/P PCI to LAD and OM in 1992. S/P PCI to first DX in 2000. S/P CABG x 3 in May 2011   Lambda light chain myeloma 10/28/2021   Left hip pain 01/03/2020   OSA (obstructive sleep apnea) 01/11/2018   AHI 18.1 and SaO2 low 73%   PAD (peripheral artery disease) 11/18/2017   ABIs/Arterial US 04/2019: R 1.21; L 0.66 // R SFA 30-49, stable > 50 CIA and EIA stenosis; L > 50 CIA stenosis (likely represents severe stenosis or short segment occlusion)   Peripheral neuropathy    Pneumonia 02/2011    Sacroiliac joint dysfunction of left side 01/03/2020   Stroke 04/16/2001   small right cerebellar infarct on 04/16/2001 at that time he was found to have proximal left vertebral artery, proximal left common carotid artery and both external carotid artery stenosis as well as intracranial stenosis involving mid basilar artery- 07/2011 add questionable TIA   Allergies  Allergen Reactions   Codeine Nausea And Vomiting   Lisinopril Cough   Nsaids Other (See Comments)    CKD   Dapagliflozin Rash   Latex Hives   Past Surgical History:  Procedure Laterality Date    NASAL ENDOSCOPY  01/11/2020   chronic rhinitis w/o evidence of acute sinusitis, bilateral inferior turbinate hypertrophy   ABDOMINAL AORTOGRAM W/LOWER EXTREMITY Bilateral 05/30/2019  Procedure: ABDOMINAL AORTOGRAM W/LOWER EXTREMITY;  Surgeon: Iran Ouch, MD;  Location: [facility] INVASIVE [facility];  Service: Cardiovascular;  Laterality: Bilateral;   ANGIOPLASTY  1989   right coronary artery   ANGIOPLASTY  1992   LAD and OM   ANGIOPLASTY  1998   First DX   BRAIN SURGERY     on prior records   CORONARY ARTERY BYPASS GRAFT  11/12/2009   LIMA to LAD, SVG to OM and SVG to RCA   CORONARY STENT PLACEMENT  2000   Stent to LAD/Circumflex with angioplasty to first diagonal    LEFT HEART CATH AND CORS/GRAFTS ANGIOGRAPHY N/A 10/13/2017   Procedure: LEFT HEART CATH AND CORS/GRAFTS ANGIOGRAPHY;  Surgeon: Yvonne Kendall, MD;  Location: [facility] INVASIVE CV LAB;  Service: Cardiovascular;  Laterality: N/A;   Family History  Problem Relation Age of Onset   Depression Mother    Early death Mother    Kidney disease Mother    Ovarian cancer Mother    Hypertension Father    Heart disease Father    Heart attack Father    Asthma Brother    Diabetes Brother    Stroke Other        Uncle   Diabetes Sister    Colon cancer Neg Hx    Rectal cancer Neg Hx    Stomach cancer Neg Hx    Esophageal cancer Neg Hx    Social History   Social History  Narrative   Marital status/children/pets: divorced.   Education/employment: retired, Patient has his GED. 9th grade education.    Safety:      -smoke alarm in the home:Yes     - wears seatbelt: Yes   Patient is right handed.   Patient does not drink any caffeine.   Lives in a one story home     Allergies as of 10/11/2022       Reactions   Codeine Nausea And Vomiting   Lisinopril Cough   Nsaids Other (See Comments)   CKD   Dapagliflozin Rash   Latex Hives        Medication List        Accurate as of October 11, 2022  9:35 AM. If you have any questions, ask your nurse or doctor.          STOP taking these medications    blood glucose meter kit and supplies Kit Stopped by: Felix Pacini, DO       TAKE these medications    apixaban 5 MG Tabs tablet Commonly known as: Eliquis Take 1 tablet (5 mg total) by mouth 2 (two) times daily.   dofetilide 125 MCG capsule Commonly known as: TIKOSYN Take 1 capsule (125 mcg total) by mouth 2 (two) times daily.   ezetimibe 10 MG tablet Commonly known as: ZETIA Take 1 tablet (10 mg total) by mouth daily.   famciclovir 250 MG tablet Commonly known as: FAMVIR Take 1 tablet (250 mg total) by mouth daily.   GENTEAL OP Place 1 drop into both eyes daily as needed (dry eyes).   insulin glargine 100 UNIT/ML Solostar Pen Commonly known as: LANTUS Inject 40 Units into the skin 2 (two) times daily. What changed: how much to take Changed by: Felix Pacini, DO   lenalidomide 10 MG capsule Commonly known as: REVLIMID Take 1 capsule (10 mg total) by mouth daily. [employer] Auth #[id number].  21 days on and 7 days off.   loratadine 10 MG tablet Commonly known as: CLARITIN Take 10  mg by mouth daily as needed for allergies.   metoprolol tartrate 25 MG tablet Commonly known as: LOPRESSOR Take 0.5 tablets (12.5 mg total) by mouth 2 (two) times daily.   nitroGLYCERIN 0.4 MG SL tablet Commonly known as: NITROSTAT Place 0.4 mg under the  tongue every 5 (five) minutes as needed for chest pain.   olmesartan 40 MG tablet Commonly known as: BENICAR Take 10 mg by mouth 2 (two) times daily.   ondansetron 8 MG tablet Commonly known as: ZOFRAN Take 1 tablet (8 mg total) by mouth every 8 (eight) hours as needed for nausea or vomiting.   OneTouch Delica Plus Lancet33G Misc USE UP TO FOUR TIMES DAILY AS DIRECTED   OneTouch Verio test strip Generic drug: glucose blood USE UP TO FOUR TIMES DAILY AS DIRECTED.   Pen Needles 31G X 6 MM Misc 1 Application by Does not apply route at bedtime. What changed: Another medication with the same name was added. Make sure you understand how and when to take each. Changed by: Felix Pacini, DO   Insulin Pen Needle 31G X 4 MM Misc Use BID with lantus What changed: You were already taking a medication with the same name, and this prescription was added. Make sure you understand how and when to take each. Changed by: Felix Pacini, DO   polyethylene glycol powder 17 GM/SCOOP powder Commonly known as: GLYCOLAX/MIRALAX Take by mouth every other day.   rosuvastatin 20 MG tablet Commonly known as: CRESTOR Take 1 tablet (20 mg total) by mouth at bedtime.   Rybelsus 7 MG Tabs Generic drug: Semaglutide TAKE 1 TABLET BY MOUTH EVERY DAY WITH BREAKFAST What changed: Another medication with the same name was added. Make sure you understand how and when to take each. Changed by: Felix Pacini, DO   Rybelsus 14 MG Tabs Generic drug: Semaglutide Take 1 tablet (14 mg total) by mouth daily. What changed: You were already taking a medication with the same name, and this prescription was added. Make sure you understand how and when to take each. Changed by: Felix Pacini, DO        All past medical history, surgical history, allergies, family history, immunizations andmedications were updated in the EMR today and reviewed under the history and medication portions of their EMR.      ROS: 14 pt  review of systems performed and negative (unless mentioned in an HPI)  Objective: BP 135/69   Pulse (!) 49   Temp (!) 97.5 F (36.4 C)   Wt 165 lb 12.8 oz (75.2 kg)   SpO2 97%   BMI 25.21 kg/m  Physical Exam Vitals and nursing note reviewed.  Constitutional:      General: He is not in acute distress.    Appearance: Normal appearance. He is not ill-appearing, toxic-appearing or diaphoretic.  HENT:     Head: Normocephalic and atraumatic.  Eyes:     General: No scleral icterus.       Right eye: No discharge.        Left eye: No discharge.     Extraocular Movements: Extraocular movements intact.     Pupils: Pupils are equal, round, and reactive to light.  Cardiovascular:     Rate and Rhythm: Normal rate and regular rhythm.  Pulmonary:     Effort: Pulmonary effort is normal. No respiratory distress.     Breath sounds: Normal breath sounds. No wheezing, rhonchi or rales.  Musculoskeletal:     Right lower leg: No edema.  Left lower leg: No edema.  Skin:    General: Skin is warm and dry.     Coloration: Skin is not jaundiced or pale.     Findings: No rash.  Neurological:     Mental Status: He is alert and oriented to person, place, and time. Mental status is at baseline.  Psychiatric:        Mood and Affect: Mood normal.        Behavior: Behavior normal.        Thought Content: Thought content normal.        Judgment: Judgment normal.    Diabetic Foot Exam - Simple   Simple Foot Form Diabetic Foot exam was performed with the following findings: Yes 10/11/2022  8:30 AM  Visual Inspection No deformities, no ulcerations, no other skin breakdown bilaterally: Yes Sensation Testing Intact to touch and monofilament testing bilaterally: Yes Pulse Check Posterior Tibialis and Dorsalis pulse intact bilaterally: Yes Comments      Results for orders placed or performed in visit on 10/11/22 (from the past 24 hour(s))  POCT glycosylated hemoglobin (Hb A1C)     Status: Abnormal    Collection Time: 10/11/22  8:23 AM  Result Value Ref Range   Hemoglobin A1C 8.8 (A) 4.0 - 5.6 %   HbA1c POC (<> result, manual entry) 8.8 4.0 - 5.6 %   HbA1c, POC (prediabetic range) 8.8 (A) 5.7 - 6.4 %   HbA1c, POC (controlled diabetic range) 8.8 (A) 0.0 - 7.0 %     Assessment/plan: Marvin Salinas is a 77 y.o. male present for  Type 2 diabetes mellitus with stage 3 chronic kidney disease, with long-term current use of insulin (HCC) -Patient was encouraged to follow a diabetic diet and make an effort to get routine exercise.  -increase Lantus to 40 units BID - Given his cardiac history and his CKD oral options are limited-insurance does not cover Jardiance.  Developed a rash with [place]. -DISCONTINUED: glipizide 10/10 mg twice daily before meals today. -increase Rybelsus 14 mg daily - Medicines tried: Ozempic (side effects), Farxiga (rash) - He declined nutrition referral - microalb collected today PNA series: Pneumonia series completed Flu shot: Up-to-date 2023 (recommneded yearly).   Foot exam: Referred to podiatry 12/2018-completed t5/03/2022 Eye exam: Dr. Hazle Quant 2023 A1c: 8.9>>> 13.7 (08/22/2018)>>11 (01/24/2019)>>10.8 08/20/2019> 10.5>10.3> 6.4 > 6.2 > 8.4 >7.6 today> 8.1 today (requiring chronic steroids for myeloma) > 8.8  Atrial fibrillation with RVR (HCC)/Essential hypertension/Ischemic heart disease/ S/P CABG x 3/Paroxysmal atrial flutter (HCC)/PAD (peripheral artery disease) (HCC)/Nonsustained ventricular tachycardia (HCC)/HLD/NSTEMI/h/o stroke/PAD Stable - Patient aware goal is less than 130/80.   - Prescribed by cardio: Metoprolol, Benicar, tikosyn, Eliquis (recently changed from xarelto) -Continue Crestor 20 mg daily  -Continue zeita. -Continue Benicar to 40 mg daily.   -Continue metoprolol 12.5 mg twice daily     Chronic kidney disease (CKD), stage III (moderate) (HCC)/lambda light chain myeloma Renally dose meds. Avoid NSAIDs. PTH/calcium/vitamin D >  up-to-date Referred to nephrology 2022.   Established with heme-onc for myeloma Reviewed recent labs 09/2022   Return in about 24 weeks (around 03/28/2023) for Routine chronic condition follow-up.  Orders Placed This Encounter  Procedures   Urine Microalbumin w/creat. ratio   POCT glycosylated hemoglobin (Hb A1C)    Meds ordered this encounter  Medications   insulin glargine (LANTUS) 100 UNIT/ML Solostar Pen    Sig: Inject 40 Units into the skin 2 (two) times daily.    Dispense:  90 mL  Refill:  1   rosuvastatin (CRESTOR) 20 MG tablet    Sig: Take 1 tablet (20 mg total) by mouth at bedtime.    Dispense:  90 tablet    Refill:  3   Semaglutide (RYBELSUS) 14 MG TABS    Sig: Take 1 tablet (14 mg total) by mouth daily.    Dispense:  90 tablet    Refill:  1   Insulin Pen Needle 31G X 4 MM MISC    Sig: Use BID with lantus    Dispense:  100 each    Refill:  11    IDDM    Referral Orders  No referral(s) requested today     Note is dictated utilizing voice recognition software. Although note has been proof read prior to signing, occasional typographical errors still can be missed. If any questions arise, please do not hesitate to call for verification.  Electronically signed by: Felix Pacini, DO [facility]

## 2022-10-12 ENCOUNTER — Other Ambulatory Visit: Payer: Self-pay

## 2022-10-12 LAB — PROTEIN ELECTROPHORESIS, SERUM, WITH REFLEX
A/G Ratio: 1.7 (ref 0.7–1.7)
Albumin ELP: 3.5 g/dL (ref 2.9–4.4)
Alpha-1-Globulin: 0.2 g/dL (ref 0.0–0.4)
Alpha-2-Globulin: 0.7 g/dL (ref 0.4–1.0)
Beta Globulin: 0.9 g/dL (ref 0.7–1.3)
Gamma Globulin: 0.2 g/dL — ABNORMAL LOW (ref 0.4–1.8)
Globulin, Total: 2.1 g/dL — ABNORMAL LOW (ref 2.2–3.9)
Total Protein ELP: 5.6 g/dL — ABNORMAL LOW (ref 6.0–8.5)

## 2022-10-13 ENCOUNTER — Encounter: Payer: Self-pay | Admitting: *Deleted

## 2022-10-13 NOTE — Progress Notes (Signed)
Pt arrived to lobby without an appt stating that he "has had a large knot in his side since his Tx on Friday. And it feels horrible." RN out to lobby to inform pt that there are no open provider appts available today and that per order of S. Montez Morita NP, pt should go to the nearest urgent care or ER.  Pt states that his area of concern is on his abdomen from an injection received on 10/08/22.  Celene Skeen NP informed.  Pt states that he will head to the [facility] ER now.

## 2022-10-22 ENCOUNTER — Other Ambulatory Visit: Payer: Self-pay | Admitting: *Deleted

## 2022-10-22 ENCOUNTER — Inpatient Hospital Stay: Payer: 59 | Attending: Family

## 2022-10-22 ENCOUNTER — Inpatient Hospital Stay: Payer: 59

## 2022-10-22 VITALS — BP 140/58 | HR 68 | Temp 97.6°F | Resp 17

## 2022-10-22 DIAGNOSIS — C9000 Multiple myeloma not having achieved remission: Secondary | ICD-10-CM

## 2022-10-22 DIAGNOSIS — Z5112 Encounter for antineoplastic immunotherapy: Secondary | ICD-10-CM | POA: Diagnosis not present

## 2022-10-22 DIAGNOSIS — Z79899 Other long term (current) drug therapy: Secondary | ICD-10-CM | POA: Diagnosis not present

## 2022-10-22 LAB — CMP (CANCER CENTER ONLY)
ALT: 28 U/L (ref 0–44)
AST: 22 U/L (ref 15–41)
Albumin: 4.1 g/dL (ref 3.5–5.0)
Alkaline Phosphatase: 56 U/L (ref 38–126)
Anion gap: 9 (ref 5–15)
BUN: 22 mg/dL (ref 8–23)
CO2: 31 mmol/L (ref 22–32)
Calcium: 9.3 mg/dL (ref 8.9–10.3)
Chloride: 101 mmol/L (ref 98–111)
Creatinine: 1.57 mg/dL — ABNORMAL HIGH (ref 0.61–1.24)
GFR, Estimated: 45 mL/min — ABNORMAL LOW (ref >60)
Glucose, Bld: 118 mg/dL — ABNORMAL HIGH (ref 70–99)
Potassium: 4.4 mmol/L (ref 3.5–5.1)
Sodium: 141 mmol/L (ref 135–145)
Total Bilirubin: 0.5 mg/dL (ref 0.3–1.2)
Total Protein: 6.4 g/dL — ABNORMAL LOW (ref 6.5–8.1)

## 2022-10-22 LAB — CBC WITH DIFFERENTIAL (CANCER CENTER ONLY)
Abs Immature Granulocytes: 0.03 10*3/uL (ref 0.00–0.07)
Basophils Absolute: 0 10*3/uL (ref 0.0–0.1)
Basophils Relative: 0 %
Eosinophils Absolute: 0 10*3/uL (ref 0.0–0.5)
Eosinophils Relative: 0 %
HCT: 36.4 % — ABNORMAL LOW (ref 39.0–52.0)
Hemoglobin: 12.4 g/dL — ABNORMAL LOW (ref 13.0–17.0)
Immature Granulocytes: 0 %
Lymphocytes Relative: 23 %
Lymphs Abs: 1.5 10*3/uL (ref 0.7–4.0)
MCH: 31.2 pg (ref 26.0–34.0)
MCHC: 34.1 g/dL (ref 30.0–36.0)
MCV: 91.7 fL (ref 80.0–100.0)
Monocytes Absolute: 0.5 10*3/uL (ref 0.1–1.0)
Monocytes Relative: 8 %
Neutro Abs: 4.7 10*3/uL (ref 1.7–7.7)
Neutrophils Relative %: 69 %
Platelet Count: 142 10*3/uL — ABNORMAL LOW (ref 150–400)
RBC: 3.97 MIL/uL — ABNORMAL LOW (ref 4.22–5.81)
RDW: 14.6 % (ref 11.5–15.5)
WBC Count: 6.8 10*3/uL (ref 4.0–10.5)
nRBC: 0 % (ref 0.0–0.2)

## 2022-10-22 MED ORDER — BORTEZOMIB CHEMO SQ INJECTION 3.5 MG (2.5MG/ML)
1.3000 mg/m2 | Freq: Once | INTRAMUSCULAR | Status: AC
  Administered 2022-10-22: 2.5 mg via SUBCUTANEOUS
  Filled 2022-10-22: qty 1

## 2022-10-22 MED ORDER — FAMCICLOVIR 250 MG PO TABS: 250.0000 mg | ORAL_TABLET | Freq: Every day | ORAL | 12 refills | Status: DC

## 2022-10-22 MED ORDER — PROCHLORPERAZINE MALEATE 10 MG PO TABS
10.0000 mg | ORAL_TABLET | Freq: Once | ORAL | Status: DC
Start: 2022-10-22 — End: 2022-10-22

## 2022-10-22 MED ORDER — LORAZEPAM 1 MG PO TABS: 0.5000 mg | ORAL_TABLET | Freq: Once | ORAL | Status: DC

## 2022-10-22 MED ORDER — ONDANSETRON HCL 8 MG PO TABS
8.0000 mg | ORAL_TABLET | Freq: Three times a day (TID) | ORAL | 3 refills | Status: DC | PRN
Start: 2022-10-22 — End: 2023-05-04

## 2022-10-22 NOTE — Progress Notes (Signed)
Patient is still taking Tikosyn. Compazine premed changed to lorazepam per Dr. Gustavo Lah instructions.

## 2022-10-22 NOTE — Patient Instructions (Signed)
[facility]  Discharge Instructions: Thank you for choosing [facility] to provide your oncology and hematology care.   If you have a lab appointment with the Cancer Center, please go directly to the Cancer Center and check in at the registration area.  Wear comfortable clothing and clothing appropriate for easy access to any Portacath or PICC line.   We strive to give you quality time with your provider. You may need to reschedule your appointment if you arrive late (15 or more minutes).  Arriving late affects you and other patients whose appointments are after yours.  Also, if you miss three or more appointments without notifying the office, you may be dismissed from the clinic at the provider's discretion.      For prescription refill requests, have your pharmacy contact our office and allow 72 hours for refills to be completed.    Today you received the following chemotherapy and/or immunotherapy agents Velcade.      To help prevent nausea and vomiting after your treatment, we encourage you to take your nausea medication as directed.  BELOW ARE SYMPTOMS THAT SHOULD BE REPORTED IMMEDIATELY: *FEVER GREATER THAN 100.4 F (38 C) OR HIGHER *CHILLS OR SWEATING *NAUSEA AND VOMITING THAT IS NOT CONTROLLED WITH YOUR NAUSEA MEDICATION *UNUSUAL SHORTNESS OF BREATH *UNUSUAL BRUISING OR BLEEDING *URINARY PROBLEMS (pain or burning when urinating, or frequent urination) *BOWEL PROBLEMS (unusual diarrhea, constipation, pain near the anus) TENDERNESS IN MOUTH AND THROAT WITH OR WITHOUT PRESENCE OF ULCERS (sore throat, sores in mouth, or a toothache) UNUSUAL RASH, SWELLING OR PAIN  UNUSUAL VAGINAL DISCHARGE OR ITCHING   Items with * indicate a potential emergency and should be followed up as soon as possible or go to the Emergency Department if any problems should occur.  Please show the CHEMOTHERAPY ALERT CARD or IMMUNOTHERAPY ALERT CARD at check-in  to the Emergency Department and triage nurse. Should you have questions after your visit or need to cancel or reschedule your appointment, please contact [facility]  [phone] and follow the prompts.  Office hours are 8:00 a.m. to 4:30 p.m. Monday - Friday. Please note that voicemails left after 4:00 p.m. may not be returned until the following business day.  We are closed weekends and major holidays. You have access to a nurse at all times for urgent questions. Please call the main number to the clinic [phone] and follow the prompts.  For any non-urgent questions, you may also contact your provider using MyChart. We now offer e-Visits for anyone 18 and older to request care online for non-urgent symptoms. For details visit [email or web address].   Also download the MyChart app! Go to the app store, search "MyChart", open the app, select [place], and log in with your MyChart username and password.   

## 2022-10-22 NOTE — Progress Notes (Signed)
Reviewed pt labs with Dr. Ennever and pt ok to treat with creatinine 1.57  

## 2022-10-26 ENCOUNTER — Other Ambulatory Visit: Payer: Self-pay

## 2022-10-26 MED ORDER — METOPROLOL TARTRATE 25 MG PO TABS: 12.5000 mg | ORAL_TABLET | Freq: Two times a day (BID) | ORAL | 3 refills | Status: DC

## 2022-10-27 LAB — UIFE/LIGHT CHAINS/TP QN, 24-HR UR
FR KAPPA LT CH,24HR: 40.78 mg/24 hr
FR LAMBDA LT CH,24HR: 12.84 mg/24 hr
Free Kappa Lt Chains,Ur: 30.21 mg/L (ref 1.17–86.46)
Free Kappa/Lambda Ratio: 3.18 (ref 1.83–14.26)
Free Lambda Lt Chains,Ur: 9.51 mg/L (ref 0.27–15.21)
Total Protein, Urine-Ur/day: 212 mg/24 hr — ABNORMAL HIGH (ref 30–150)
Total Protein, Urine: 15.7 mg/dL
Total Volume: 1350

## 2022-10-28 ENCOUNTER — Other Ambulatory Visit: Payer: Self-pay

## 2022-10-28 ENCOUNTER — Telehealth: Payer: Self-pay

## 2022-10-28 MED ORDER — ONETOUCH DELICA PLUS LANCET33G MISC: 11 refills | Status: DC

## 2022-10-28 MED ORDER — ONETOUCH VERIO VI STRP: ORAL_STRIP | 3 refills | Status: DC

## 2022-10-28 NOTE — Telephone Encounter (Signed)
Patient refill request.  [facility]  Preferred pharmacy change to [facility]  Lancets [facility] DELICA PLUS [place]) MISC   And test strips for the above

## 2022-10-28 NOTE — Telephone Encounter (Signed)
Refill sent.

## 2022-11-01 ENCOUNTER — Encounter: Payer: Self-pay | Admitting: *Deleted

## 2022-11-04 ENCOUNTER — Other Ambulatory Visit: Payer: Self-pay | Admitting: *Deleted

## 2022-11-04 ENCOUNTER — Other Ambulatory Visit (HOSPITAL_COMMUNITY): Payer: Self-pay

## 2022-11-05 ENCOUNTER — Inpatient Hospital Stay: Payer: 59

## 2022-11-05 ENCOUNTER — Inpatient Hospital Stay (HOSPITAL_BASED_OUTPATIENT_CLINIC_OR_DEPARTMENT_OTHER): Payer: 59 | Admitting: Family

## 2022-11-05 ENCOUNTER — Encounter: Payer: Self-pay | Admitting: Family

## 2022-11-05 VITALS — BP 125/60 | HR 58 | Temp 97.7°F | Resp 17 | Wt 161.8 lb

## 2022-11-05 DIAGNOSIS — C9000 Multiple myeloma not having achieved remission: Secondary | ICD-10-CM

## 2022-11-05 DIAGNOSIS — Z5112 Encounter for antineoplastic immunotherapy: Secondary | ICD-10-CM | POA: Diagnosis not present

## 2022-11-05 DIAGNOSIS — Z79899 Other long term (current) drug therapy: Secondary | ICD-10-CM | POA: Diagnosis not present

## 2022-11-05 LAB — CBC WITH DIFFERENTIAL (CANCER CENTER ONLY)
Abs Immature Granulocytes: 0.01 10*3/uL (ref 0.00–0.07)
Basophils Absolute: 0 10*3/uL (ref 0.0–0.1)
Basophils Relative: 0 %
Eosinophils Absolute: 0.1 10*3/uL (ref 0.0–0.5)
Eosinophils Relative: 1 %
HCT: 37.2 % — ABNORMAL LOW (ref 39.0–52.0)
Hemoglobin: 12.4 g/dL — ABNORMAL LOW (ref 13.0–17.0)
Immature Granulocytes: 0 %
Lymphocytes Relative: 25 %
Lymphs Abs: 1.3 10*3/uL (ref 0.7–4.0)
MCH: 31.2 pg (ref 26.0–34.0)
MCHC: 33.3 g/dL (ref 30.0–36.0)
MCV: 93.7 fL (ref 80.0–100.0)
Monocytes Absolute: 0.6 10*3/uL (ref 0.1–1.0)
Monocytes Relative: 12 %
Neutro Abs: 3.2 10*3/uL (ref 1.7–7.7)
Neutrophils Relative %: 62 %
Platelet Count: 174 10*3/uL (ref 150–400)
RBC: 3.97 MIL/uL — ABNORMAL LOW (ref 4.22–5.81)
RDW: 14.6 % (ref 11.5–15.5)
WBC Count: 5.2 10*3/uL (ref 4.0–10.5)
nRBC: 0 % (ref 0.0–0.2)

## 2022-11-05 LAB — CMP (CANCER CENTER ONLY)
ALT: 42 U/L (ref 0–44)
AST: 38 U/L (ref 15–41)
Albumin: 4.4 g/dL (ref 3.5–5.0)
Alkaline Phosphatase: 57 U/L (ref 38–126)
Anion gap: 7 (ref 5–15)
BUN: 34 mg/dL — ABNORMAL HIGH (ref 8–23)
CO2: 30 mmol/L (ref 22–32)
Calcium: 9.9 mg/dL (ref 8.9–10.3)
Chloride: 105 mmol/L (ref 98–111)
Creatinine: 1.96 mg/dL — ABNORMAL HIGH (ref 0.61–1.24)
GFR, Estimated: 35 mL/min — ABNORMAL LOW (ref >60)
Glucose, Bld: 152 mg/dL — ABNORMAL HIGH (ref 70–99)
Potassium: 4.8 mmol/L (ref 3.5–5.1)
Sodium: 142 mmol/L (ref 135–145)
Total Bilirubin: 0.5 mg/dL (ref 0.3–1.2)
Total Protein: 6.2 g/dL — ABNORMAL LOW (ref 6.5–8.1)

## 2022-11-05 LAB — LACTATE DEHYDROGENASE: LDH: 168 U/L (ref 98–192)

## 2022-11-05 MED ORDER — DIPHENHYDRAMINE HCL 25 MG PO CAPS
50.0000 mg | ORAL_CAPSULE | Freq: Once | ORAL | Status: AC
  Administered 2022-11-05: 50 mg via ORAL
  Filled 2022-11-05: qty 2

## 2022-11-05 MED ORDER — BORTEZOMIB CHEMO SQ INJECTION 3.5 MG (2.5MG/ML)
1.3000 mg/m2 | Freq: Once | INTRAMUSCULAR | Status: AC
  Administered 2022-11-05: 2.5 mg via SUBCUTANEOUS
  Filled 2022-11-05: qty 1

## 2022-11-05 MED ORDER — LORAZEPAM 1 MG PO TABS
0.5000 mg | ORAL_TABLET | Freq: Once | ORAL | Status: AC
  Administered 2022-11-05: 0.5 mg via ORAL
  Filled 2022-11-05: qty 1

## 2022-11-05 MED ORDER — ACETAMINOPHEN 325 MG PO TABS
650.0000 mg | ORAL_TABLET | Freq: Once | ORAL | Status: AC
  Administered 2022-11-05: 650 mg via ORAL
  Filled 2022-11-05: qty 2

## 2022-11-05 MED ORDER — DARATUMUMAB-HYALURONIDASE-FIHJ 1800-30000 MG-UT/15ML ~~LOC~~ SOLN
1800.0000 mg | Freq: Once | SUBCUTANEOUS | Status: AC
  Administered 2022-11-05: 1800 mg via SUBCUTANEOUS
  Filled 2022-11-05: qty 15

## 2022-11-05 MED ORDER — DEXAMETHASONE 4 MG PO TABS
20.0000 mg | ORAL_TABLET | Freq: Once | ORAL | Status: AC
  Administered 2022-11-05: 20 mg via ORAL
  Filled 2022-11-05: qty 5

## 2022-11-05 NOTE — Progress Notes (Signed)
Hematology and Oncology Follow Up Visit  Marvin Salinas [id number] Nov 21, 1945 77 y.o. 11/05/2022   Principle Diagnosis:  Lambda light chain myeloma --normal cytogenetics   Current Therapy:        Faspro/Velcade/Revlimid -- started on 11/05/2021 - monthly    Interim History:  Marvin Salinas is here today for follow-up and treatment. He is doing fairly well but has had some issues with increased arthritis aches and pains with the rainy weather.  No M-spike observed last month, IgG level was 300 mg/dL and lambda light chains 1.78 mg/dL.  No issue with infection. No fever, chills, n/v, cough, rash, dizziness, SOB, chest pain, palpitations, abdominal pain or changes in bowel or bladder habits.  No swelling in his extremities.  Neuropathy in his hands and lower extremities unchanged from baseline.  No falls or syncope reported. He ambulates with a cane for added support.  Appetite and hydration are good. Weight is stable at 161 lbs.   ECOG Performance Status: 1 - Symptomatic but completely ambulatory  Medications:  Allergies as of 11/05/2022       Reactions   Codeine Nausea And Vomiting   Lisinopril Cough   Nsaids Other (See Comments)   CKD   Dapagliflozin Rash   Latex Hives        Medication List        Accurate as of Nov 05, 2022 10:11 AM. If you have any questions, ask your nurse or doctor.          apixaban 5 MG Tabs tablet Commonly known as: Eliquis Take 1 tablet (5 mg total) by mouth 2 (two) times daily.   dofetilide 125 MCG capsule Commonly known as: TIKOSYN Take 1 capsule (125 mcg total) by mouth 2 (two) times daily.   ezetimibe 10 MG tablet Commonly known as: ZETIA Take 1 tablet (10 mg total) by mouth daily.   famciclovir 250 MG tablet Commonly known as: FAMVIR Take 1 tablet (250 mg total) by mouth daily.   GENTEAL OP Place 1 drop into both eyes daily as needed (dry eyes).   insulin glargine 100 UNIT/ML Solostar Pen Commonly known as: LANTUS Inject 40  Units into the skin 2 (two) times daily.   lenalidomide 10 MG capsule Commonly known as: REVLIMID Take 1 capsule (10 mg total) by mouth daily. [employer] Auth #[id number].  21 days on and 7 days off.   loratadine 10 MG tablet Commonly known as: CLARITIN Take 10 mg by mouth daily as needed for allergies.   metoprolol tartrate 25 MG tablet Commonly known as: LOPRESSOR Take 0.5 tablets (12.5 mg total) by mouth 2 (two) times daily.   nitroGLYCERIN 0.4 MG SL tablet Commonly known as: NITROSTAT Place 0.4 mg under the tongue every 5 (five) minutes as needed for chest pain.   olmesartan 40 MG tablet Commonly known as: BENICAR Take 10 mg by mouth 2 (two) times daily.   ondansetron 8 MG tablet Commonly known as: ZOFRAN Take 1 tablet (8 mg total) by mouth every 8 (eight) hours as needed for nausea or vomiting.   OneTouch Delica Plus Lancet33G Misc USE UP TO FOUR TIMES DAILY AS DIRECTED   OneTouch Verio test strip Generic drug: glucose blood USE UP TO FOUR TIMES DAILY AS DIRECTED.   Pen Needles 31G X 6 MM Misc 1 Application by Does not apply route at bedtime.   Insulin Pen Needle 31G X 4 MM Misc Use BID with lantus   polyethylene glycol powder 17 GM/SCOOP powder Commonly known as:  GLYCOLAX/MIRALAX Take by mouth every other day.   rosuvastatin 20 MG tablet Commonly known as: CRESTOR Take 1 tablet (20 mg total) by mouth at bedtime.   Rybelsus 7 MG Tabs Generic drug: Semaglutide TAKE 1 TABLET BY MOUTH EVERY DAY WITH BREAKFAST   Rybelsus 14 MG Tabs Generic drug: Semaglutide Take 1 tablet (14 mg total) by mouth daily.        Allergies:  Allergies  Allergen Reactions   Codeine Nausea And Vomiting   Lisinopril Cough   Nsaids Other (See Comments)    CKD   Dapagliflozin Rash   Latex Hives    Past Medical History, Surgical history, Social history, and Family History were reviewed and updated.  Review of Systems: All other 10 point review of systems is negative.    Physical Exam:  vitals were not taken for this visit.   Wt Readings from Last 3 Encounters:  10/11/22 165 lb 12.8 oz (75.2 kg)  10/08/22 162 lb 12.8 oz (73.8 kg)  09/09/22 163 lb (73.9 kg)    Ocular: Sclerae unicteric, pupils equal, round and reactive to light Ear-nose-throat: Oropharynx clear, dentition fair Lymphatic: No cervical or supraclavicular adenopathy Lungs no rales or rhonchi, good excursion bilaterally Heart regular rate and rhythm, no murmur appreciated Abd soft, nontender, positive bowel sounds MSK no focal spinal tenderness, no joint edema Neuro: non-focal, well-oriented, appropriate affect Breasts: Deferred   Lab Results  Component Value Date   WBC 6.8 10/22/2022   HGB 12.4 (L) 10/22/2022   HCT 36.4 (L) 10/22/2022   MCV 91.7 10/22/2022   PLT 142 (L) 10/22/2022   Lab Results  Component Value Date   FERRITIN 45 10/28/2021   IRON 200 (H) 10/28/2021   TIBC 365 10/28/2021   UIBC 165 10/28/2021   IRONPCTSAT 55 (H) 10/28/2021   Lab Results  Component Value Date   RETICCTPCT 1.5 02/04/2021   RBC 3.97 (L) 10/22/2022   Lab Results  Component Value Date   KPAFRELGTCHN 17.1 10/08/2022   LAMBDASER 17.8 10/08/2022   KAPLAMBRATIO 3.18 10/22/2022   Lab Results  Component Value Date   IGGSERUM 300 (L) 10/08/2022   IGA 35 (L) 10/08/2022   IGMSERUM 8 (L) 10/08/2022   Lab Results  Component Value Date   TOTALPROTELP 5.6 (L) 10/08/2022   ALBUMINELP 3.5 10/08/2022   A1GS 0.2 10/08/2022   A2GS 0.7 10/08/2022   BETS 0.9 10/08/2022   GAMS 0.2 (L) 10/08/2022   MSPIKE Not Observed 10/08/2022   SPEI Comment 07/16/2022     Chemistry      Component Value Date/Time   NA 141 10/22/2022 1025   NA 140 08/31/2021 1026   K 4.4 10/22/2022 1025   CL 101 10/22/2022 1025   CO2 31 10/22/2022 1025   BUN 22 10/22/2022 1025   BUN 21 08/31/2021 1026   CREATININE 1.57 (H) 10/22/2022 1025      Component Value Date/Time   CALCIUM 9.3 10/22/2022 1025   ALKPHOS 56  10/22/2022 1025   AST 22 10/22/2022 1025   ALT 28 10/22/2022 1025   BILITOT 0.5 10/22/2022 1025       Impression and Plan: Marvin Salinas is a pleasant 77 yo caucasian gentleman with lambda light chains myeloma with mild renal insufficiency  Serum protein studies are pending.  Treatment every 2 weeks, follow-up in 4 weeks.   Marvin Stanford, NP 5/17/202410:11 AM

## 2022-11-05 NOTE — Progress Notes (Signed)
Ok to treat with creatinine 1.96 per Eileen Stanford, NP.

## 2022-11-05 NOTE — Patient Instructions (Signed)
[facility]  Discharge Instructions: Thank you for choosing [facility] to provide your oncology and hematology care.   If you have a lab appointment with the Cancer Center, please go directly to the Cancer Center and check in at the registration area.  Wear comfortable clothing and clothing appropriate for easy access to any Portacath or PICC line.   We strive to give you quality time with your provider. You may need to reschedule your appointment if you arrive late (15 or more minutes).  Arriving late affects you and other patients whose appointments are after yours.  Also, if you miss three or more appointments without notifying the office, you may be dismissed from the clinic at the provider's discretion.      For prescription refill requests, have your pharmacy contact our office and allow 72 hours for refills to be completed.    Today you received the following chemotherapy and/or immunotherapy agents Velcade/Darzalex      To help prevent nausea and vomiting after your treatment, we encourage you to take your nausea medication as directed.  BELOW ARE SYMPTOMS THAT SHOULD BE REPORTED IMMEDIATELY: *FEVER GREATER THAN 100.4 F (38 C) OR HIGHER *CHILLS OR SWEATING *NAUSEA AND VOMITING THAT IS NOT CONTROLLED WITH YOUR NAUSEA MEDICATION *UNUSUAL SHORTNESS OF BREATH *UNUSUAL BRUISING OR BLEEDING *URINARY PROBLEMS (pain or burning when urinating, or frequent urination) *BOWEL PROBLEMS (unusual diarrhea, constipation, pain near the anus) TENDERNESS IN MOUTH AND THROAT WITH OR WITHOUT PRESENCE OF ULCERS (sore throat, sores in mouth, or a toothache) UNUSUAL RASH, SWELLING OR PAIN  UNUSUAL VAGINAL DISCHARGE OR ITCHING   Items with * indicate a potential emergency and should be followed up as soon as possible or go to the Emergency Department if any problems should occur.  Please show the CHEMOTHERAPY ALERT CARD or IMMUNOTHERAPY ALERT CARD at  check-in to the Emergency Department and triage nurse. Should you have questions after your visit or need to cancel or reschedule your appointment, please contact [facility]  [phone] and follow the prompts.  Office hours are 8:00 a.m. to 4:30 p.m. Monday - Friday. Please note that voicemails left after 4:00 p.m. may not be returned until the following business day.  We are closed weekends and major holidays. You have access to a nurse at all times for urgent questions. Please call the main number to the clinic [phone] and follow the prompts.  For any non-urgent questions, you may also contact your provider using MyChart. We now offer e-Visits for anyone 18 and older to request care online for non-urgent symptoms. For details visit [email or web address].   Also download the MyChart app! Go to the app store, search "MyChart", open the app, select [facility], and log in with your MyChart username and password.   

## 2022-11-07 LAB — IGG, IGA, IGM
IgA: 29 mg/dL — ABNORMAL LOW (ref 61–437)
IgG (Immunoglobin G), Serum: 287 mg/dL — ABNORMAL LOW (ref 603–1613)
IgM (Immunoglobulin M), Srm: 8 mg/dL — ABNORMAL LOW (ref 15–143)

## 2022-11-09 DIAGNOSIS — H53122 Transient visual loss, left eye: Secondary | ICD-10-CM | POA: Diagnosis not present

## 2022-11-09 DIAGNOSIS — H43813 Vitreous degeneration, bilateral: Secondary | ICD-10-CM | POA: Diagnosis not present

## 2022-11-09 DIAGNOSIS — E119 Type 2 diabetes mellitus without complications: Secondary | ICD-10-CM | POA: Diagnosis not present

## 2022-11-09 DIAGNOSIS — H2513 Age-related nuclear cataract, bilateral: Secondary | ICD-10-CM | POA: Diagnosis not present

## 2022-11-09 DIAGNOSIS — Z7984 Long term (current) use of oral hypoglycemic drugs: Secondary | ICD-10-CM | POA: Diagnosis not present

## 2022-11-09 LAB — KAPPA/LAMBDA LIGHT CHAINS
Kappa free light chain: 13.8 mg/L (ref 3.3–19.4)
Kappa, lambda light chain ratio: 0.83 (ref 0.26–1.65)
Lambda free light chains: 16.7 mg/L (ref 5.7–26.3)

## 2022-11-10 DIAGNOSIS — R809 Proteinuria, unspecified: Secondary | ICD-10-CM | POA: Diagnosis not present

## 2022-11-10 DIAGNOSIS — Z79899 Other long term (current) drug therapy: Secondary | ICD-10-CM | POA: Diagnosis not present

## 2022-11-10 DIAGNOSIS — N1832 Chronic kidney disease, stage 3b: Secondary | ICD-10-CM | POA: Diagnosis not present

## 2022-11-10 DIAGNOSIS — D631 Anemia in chronic kidney disease: Secondary | ICD-10-CM | POA: Diagnosis not present

## 2022-11-16 LAB — PROTEIN ELECTROPHORESIS, SERUM
A/G Ratio: 1.6 (ref 0.7–1.7)
Albumin ELP: 3.6 g/dL (ref 2.9–4.4)
Alpha-1-Globulin: 0.2 g/dL (ref 0.0–0.4)
Alpha-2-Globulin: 0.8 g/dL (ref 0.4–1.0)
Beta Globulin: 0.9 g/dL (ref 0.7–1.3)
Gamma Globulin: 0.2 g/dL — ABNORMAL LOW (ref 0.4–1.8)
Globulin, Total: 2.2 g/dL (ref 2.2–3.9)
Total Protein ELP: 5.8 g/dL — ABNORMAL LOW (ref 6.0–8.5)

## 2022-11-17 DIAGNOSIS — D696 Thrombocytopenia, unspecified: Secondary | ICD-10-CM | POA: Diagnosis not present

## 2022-11-17 DIAGNOSIS — D638 Anemia in other chronic diseases classified elsewhere: Secondary | ICD-10-CM | POA: Diagnosis not present

## 2022-11-17 DIAGNOSIS — E1122 Type 2 diabetes mellitus with diabetic chronic kidney disease: Secondary | ICD-10-CM | POA: Diagnosis not present

## 2022-11-17 DIAGNOSIS — R809 Proteinuria, unspecified: Secondary | ICD-10-CM | POA: Diagnosis not present

## 2022-11-19 ENCOUNTER — Inpatient Hospital Stay: Payer: 59

## 2022-11-19 VITALS — BP 112/54 | HR 61 | Temp 98.0°F | Resp 16

## 2022-11-19 DIAGNOSIS — C9000 Multiple myeloma not having achieved remission: Secondary | ICD-10-CM

## 2022-11-19 DIAGNOSIS — Z79899 Other long term (current) drug therapy: Secondary | ICD-10-CM | POA: Diagnosis not present

## 2022-11-19 DIAGNOSIS — Z5112 Encounter for antineoplastic immunotherapy: Secondary | ICD-10-CM | POA: Diagnosis not present

## 2022-11-19 LAB — CMP (CANCER CENTER ONLY)
ALT: 42 U/L (ref 0–44)
AST: 29 U/L (ref 15–41)
Albumin: 4.4 g/dL (ref 3.5–5.0)
Alkaline Phosphatase: 46 U/L (ref 38–126)
Anion gap: 5 (ref 5–15)
BUN: 24 mg/dL — ABNORMAL HIGH (ref 8–23)
CO2: 29 mmol/L (ref 22–32)
Calcium: 10 mg/dL (ref 8.9–10.3)
Chloride: 107 mmol/L (ref 98–111)
Creatinine: 1.92 mg/dL — ABNORMAL HIGH (ref 0.61–1.24)
GFR, Estimated: 35 mL/min — ABNORMAL LOW (ref >60)
Glucose, Bld: 143 mg/dL — ABNORMAL HIGH (ref 70–99)
Potassium: 5 mmol/L (ref 3.5–5.1)
Sodium: 141 mmol/L (ref 135–145)
Total Bilirubin: 0.5 mg/dL (ref 0.3–1.2)
Total Protein: 6.4 g/dL — ABNORMAL LOW (ref 6.5–8.1)

## 2022-11-19 LAB — CBC WITH DIFFERENTIAL (CANCER CENTER ONLY)
Abs Immature Granulocytes: 0.02 10*3/uL (ref 0.00–0.07)
Basophils Absolute: 0 10*3/uL (ref 0.0–0.1)
Basophils Relative: 1 %
Eosinophils Absolute: 0 10*3/uL (ref 0.0–0.5)
Eosinophils Relative: 1 %
HCT: 38.9 % — ABNORMAL LOW (ref 39.0–52.0)
Hemoglobin: 13.2 g/dL (ref 13.0–17.0)
Immature Granulocytes: 0 %
Lymphocytes Relative: 21 %
Lymphs Abs: 1.5 10*3/uL (ref 0.7–4.0)
MCH: 31.3 pg (ref 26.0–34.0)
MCHC: 33.9 g/dL (ref 30.0–36.0)
MCV: 92.2 fL (ref 80.0–100.0)
Monocytes Absolute: 0.6 10*3/uL (ref 0.1–1.0)
Monocytes Relative: 8 %
Neutro Abs: 5.2 10*3/uL (ref 1.7–7.7)
Neutrophils Relative %: 69 %
Platelet Count: 144 10*3/uL — ABNORMAL LOW (ref 150–400)
RBC: 4.22 MIL/uL (ref 4.22–5.81)
RDW: 13.9 % (ref 11.5–15.5)
WBC Count: 7.4 10*3/uL (ref 4.0–10.5)
nRBC: 0 % (ref 0.0–0.2)

## 2022-11-19 MED ORDER — BORTEZOMIB CHEMO SQ INJECTION 3.5 MG (2.5MG/ML)
1.3000 mg/m2 | Freq: Once | INTRAMUSCULAR | Status: AC
  Administered 2022-11-19: 2.5 mg via SUBCUTANEOUS
  Filled 2022-11-19: qty 1

## 2022-11-19 MED ORDER — LORAZEPAM 1 MG PO TABS
0.5000 mg | ORAL_TABLET | Freq: Once | ORAL | Status: AC
  Administered 2022-11-19: 0.5 mg via ORAL
  Filled 2022-11-19: qty 1

## 2022-11-19 NOTE — Progress Notes (Signed)
Ok to treat with creatinine of 1.92 per DR Myna Hidalgo. dph

## 2022-11-19 NOTE — Patient Instructions (Signed)
[facility]  Discharge Instructions: Thank you for choosing [facility] to provide your oncology and hematology care.   If you have a lab appointment with the Cancer Center, please go directly to the Cancer Center and check in at the registration area.  Wear comfortable clothing and clothing appropriate for easy access to any Portacath or PICC line.   We strive to give you quality time with your provider. You may need to reschedule your appointment if you arrive late (15 or more minutes).  Arriving late affects you and other patients whose appointments are after yours.  Also, if you miss three or more appointments without notifying the office, you may be dismissed from the clinic at the provider's discretion.      For prescription refill requests, have your pharmacy contact our office and allow 72 hours for refills to be completed.    Today you received the following chemotherapy and/or immunotherapy agents velcade     To help prevent nausea and vomiting after your treatment, we encourage you to take your nausea medication as directed.  BELOW ARE SYMPTOMS THAT SHOULD BE REPORTED IMMEDIATELY: *FEVER GREATER THAN 100.4 F (38 C) OR HIGHER *CHILLS OR SWEATING *NAUSEA AND VOMITING THAT IS NOT CONTROLLED WITH YOUR NAUSEA MEDICATION *UNUSUAL SHORTNESS OF BREATH *UNUSUAL BRUISING OR BLEEDING *URINARY PROBLEMS (pain or burning when urinating, or frequent urination) *BOWEL PROBLEMS (unusual diarrhea, constipation, pain near the anus) TENDERNESS IN MOUTH AND THROAT WITH OR WITHOUT PRESENCE OF ULCERS (sore throat, sores in mouth, or a toothache) UNUSUAL RASH, SWELLING OR PAIN  UNUSUAL VAGINAL DISCHARGE OR ITCHING   Items with * indicate a potential emergency and should be followed up as soon as possible or go to the Emergency Department if any problems should occur.  Please show the CHEMOTHERAPY ALERT CARD or IMMUNOTHERAPY ALERT CARD at check-in  to the Emergency Department and triage nurse. Should you have questions after your visit or need to cancel or reschedule your appointment, please contact [facility]  [phone] and follow the prompts.  Office hours are 8:00 a.m. to 4:30 p.m. Monday - Friday. Please note that voicemails left after 4:00 p.m. may not be returned until the following business day.  We are closed weekends and major holidays. You have access to a nurse at all times for urgent questions. Please call the main number to the clinic [phone] and follow the prompts.  For any non-urgent questions, you may also contact your provider using MyChart. We now offer e-Visits for anyone 18 and older to request care online for non-urgent symptoms. For details visit [email or web address].   Also download the MyChart app! Go to the app store, search "MyChart", open the app, select , and log in with your MyChart username and password.   

## 2022-12-03 ENCOUNTER — Encounter: Payer: Self-pay | Admitting: Hematology & Oncology

## 2022-12-03 ENCOUNTER — Inpatient Hospital Stay: Payer: 59

## 2022-12-03 ENCOUNTER — Inpatient Hospital Stay: Payer: 59 | Attending: Family | Admitting: Family

## 2022-12-03 ENCOUNTER — Encounter: Payer: Self-pay | Admitting: Family

## 2022-12-03 VITALS — BP 101/63 | HR 59 | Temp 97.6°F | Resp 17 | Wt 162.4 lb

## 2022-12-03 DIAGNOSIS — M898X9 Other specified disorders of bone, unspecified site: Secondary | ICD-10-CM | POA: Diagnosis not present

## 2022-12-03 DIAGNOSIS — C9000 Multiple myeloma not having achieved remission: Secondary | ICD-10-CM

## 2022-12-03 DIAGNOSIS — Z5112 Encounter for antineoplastic immunotherapy: Secondary | ICD-10-CM | POA: Diagnosis not present

## 2022-12-03 DIAGNOSIS — Z79899 Other long term (current) drug therapy: Secondary | ICD-10-CM | POA: Diagnosis not present

## 2022-12-03 LAB — CBC WITH DIFFERENTIAL (CANCER CENTER ONLY)
Abs Immature Granulocytes: 0.04 10*3/uL (ref 0.00–0.07)
Basophils Absolute: 0 10*3/uL (ref 0.0–0.1)
Basophils Relative: 1 %
Eosinophils Absolute: 0 10*3/uL (ref 0.0–0.5)
Eosinophils Relative: 1 %
HCT: 38.9 % — ABNORMAL LOW (ref 39.0–52.0)
Hemoglobin: 13.1 g/dL (ref 13.0–17.0)
Immature Granulocytes: 1 %
Lymphocytes Relative: 20 %
Lymphs Abs: 1.2 10*3/uL (ref 0.7–4.0)
MCH: 30.9 pg (ref 26.0–34.0)
MCHC: 33.7 g/dL (ref 30.0–36.0)
MCV: 91.7 fL (ref 80.0–100.0)
Monocytes Absolute: 0.6 10*3/uL (ref 0.1–1.0)
Monocytes Relative: 9 %
Neutro Abs: 4 10*3/uL (ref 1.7–7.7)
Neutrophils Relative %: 68 %
Platelet Count: 176 10*3/uL (ref 150–400)
RBC: 4.24 MIL/uL (ref 4.22–5.81)
RDW: 13.5 % (ref 11.5–15.5)
WBC Count: 5.9 10*3/uL (ref 4.0–10.5)
nRBC: 0 % (ref 0.0–0.2)

## 2022-12-03 LAB — CMP (CANCER CENTER ONLY)
ALT: 27 U/L (ref 0–44)
AST: 20 U/L (ref 15–41)
Albumin: 4.5 g/dL (ref 3.5–5.0)
Alkaline Phosphatase: 47 U/L (ref 38–126)
Anion gap: 8 (ref 5–15)
BUN: 27 mg/dL — ABNORMAL HIGH (ref 8–23)
CO2: 29 mmol/L (ref 22–32)
Calcium: 10.1 mg/dL (ref 8.9–10.3)
Chloride: 105 mmol/L (ref 98–111)
Creatinine: 1.97 mg/dL — ABNORMAL HIGH (ref 0.61–1.24)
GFR, Estimated: 34 mL/min — ABNORMAL LOW (ref >60)
Glucose, Bld: 150 mg/dL — ABNORMAL HIGH (ref 70–99)
Potassium: 4.8 mmol/L (ref 3.5–5.1)
Sodium: 142 mmol/L (ref 135–145)
Total Bilirubin: 0.5 mg/dL (ref 0.3–1.2)
Total Protein: 6.4 g/dL — ABNORMAL LOW (ref 6.5–8.1)

## 2022-12-03 LAB — LACTATE DEHYDROGENASE: LDH: 166 U/L (ref 98–192)

## 2022-12-03 MED ORDER — BORTEZOMIB CHEMO SQ INJECTION 3.5 MG (2.5MG/ML)
1.3000 mg/m2 | Freq: Once | INTRAMUSCULAR | Status: AC
  Administered 2022-12-03: 2.5 mg via SUBCUTANEOUS
  Filled 2022-12-03: qty 1

## 2022-12-03 MED ORDER — DARATUMUMAB-HYALURONIDASE-FIHJ 1800-30000 MG-UT/15ML ~~LOC~~ SOLN
1800.0000 mg | Freq: Once | SUBCUTANEOUS | Status: AC
  Administered 2022-12-03: 1800 mg via SUBCUTANEOUS
  Filled 2022-12-03: qty 15

## 2022-12-03 MED ORDER — DIPHENHYDRAMINE HCL 25 MG PO CAPS
50.0000 mg | ORAL_CAPSULE | Freq: Once | ORAL | Status: AC
  Administered 2022-12-03: 50 mg via ORAL
  Filled 2022-12-03: qty 2

## 2022-12-03 MED ORDER — DEXAMETHASONE 4 MG PO TABS
20.0000 mg | ORAL_TABLET | Freq: Once | ORAL | Status: AC
  Administered 2022-12-03: 20 mg via ORAL
  Filled 2022-12-03: qty 5

## 2022-12-03 MED ORDER — ACETAMINOPHEN 325 MG PO TABS
650.0000 mg | ORAL_TABLET | Freq: Once | ORAL | Status: AC
  Administered 2022-12-03: 650 mg via ORAL
  Filled 2022-12-03: qty 2

## 2022-12-03 MED ORDER — LORAZEPAM 1 MG PO TABS: 0.5000 mg | ORAL_TABLET | Freq: Once | ORAL | Status: DC

## 2022-12-03 NOTE — Progress Notes (Signed)
Hematology and Oncology Follow Up Visit  Marvin Salinas [id number] Oct 15, 1945 77 y.o. 12/03/2022   Principle Diagnosis:  Lambda light chain myeloma --normal cytogenetics   Current Therapy:        Faspro/Velcade/Revlimid -- started on 11/05/2021 - monthly                Interim History:  Marvin Salinas is here today for follow-up and treatment. He is still having persistent "bone pain" all over. He has an appointment coming up with his orthopedist.  Last month no M-spike was detected, IgG level was 287 mg/dL and lambda light chains were 1.67 mg/dL.  No falls or syncope.  Neuropathy in the hands and feet unchanged from baseline.  No swelling noted.  No fever, chills, n/v, cough, rash, dizziness, SOB, chest pain, palpitations, abdominal pain or changes in bowel or bladder habits.  He takes Miralax daily to prevent constipation.  Appetite and hydration are good. Weight is stable at 162 lbs.   ECOG Performance Status: 1 - Symptomatic but completely ambulatory  Medications:  Allergies as of 12/03/2022       Reactions   Codeine Nausea And Vomiting   Lisinopril Cough   Nsaids Other (See Comments)   CKD   Dapagliflozin Rash   Latex Hives        Medication List        Accurate as of December 03, 2022 11:14 AM. If you have any questions, ask your nurse or doctor.          acetaminophen 500 MG tablet Commonly known as: TYLENOL Take 1,000 mg by mouth every 6 (six) hours as needed.   apixaban 5 MG Tabs tablet Commonly known as: Eliquis Take 1 tablet (5 mg total) by mouth 2 (two) times daily.   dofetilide 125 MCG capsule Commonly known as: TIKOSYN Take 1 capsule (125 mcg total) by mouth 2 (two) times daily.   ezetimibe 10 MG tablet Commonly known as: ZETIA Take 1 tablet (10 mg total) by mouth daily.   famciclovir 250 MG tablet Commonly known as: FAMVIR Take 1 tablet (250 mg total) by mouth daily.   GENTEAL OP Place 1 drop into both eyes daily as needed (dry eyes).    insulin glargine 100 UNIT/ML Solostar Pen Commonly known as: LANTUS Inject 40 Units into the skin 2 (two) times daily.   lenalidomide 10 MG capsule Commonly known as: REVLIMID Take 1 capsule (10 mg total) by mouth daily. [employer] Auth #[id number].  21 days on and 7 days off.   loratadine 10 MG tablet Commonly known as: CLARITIN Take 10 mg by mouth daily as needed for allergies.   metoprolol tartrate 25 MG tablet Commonly known as: LOPRESSOR Take 0.5 tablets (12.5 mg total) by mouth 2 (two) times daily.   nitroGLYCERIN 0.4 MG SL tablet Commonly known as: NITROSTAT Place 0.4 mg under the tongue every 5 (five) minutes as needed for chest pain.   olmesartan 40 MG tablet Commonly known as: BENICAR Take 10 mg by mouth 2 (two) times daily.   ondansetron 8 MG tablet Commonly known as: ZOFRAN Take 1 tablet (8 mg total) by mouth every 8 (eight) hours as needed for nausea or vomiting.   OneTouch Delica Plus Lancet33G Misc USE UP TO FOUR TIMES DAILY AS DIRECTED   OneTouch Verio test strip Generic drug: glucose blood USE UP TO FOUR TIMES DAILY AS DIRECTED.   Pen Needles 31G X 6 MM Misc 1 Application by Does not apply route at bedtime.  Insulin Pen Needle 31G X 4 MM Misc Use BID with lantus   polyethylene glycol powder 17 GM/SCOOP powder Commonly known as: GLYCOLAX/MIRALAX Take by mouth every other day.   rosuvastatin 20 MG tablet Commonly known as: CRESTOR Take 1 tablet (20 mg total) by mouth at bedtime.   Rybelsus 7 MG Tabs Generic drug: Semaglutide TAKE 1 TABLET BY MOUTH EVERY DAY WITH BREAKFAST   Rybelsus 14 MG Tabs Generic drug: Semaglutide Take 1 tablet (14 mg total) by mouth daily.        Allergies:  Allergies  Allergen Reactions   Codeine Nausea And Vomiting   Lisinopril Cough   Nsaids Other (See Comments)    CKD   Dapagliflozin Rash   Latex Hives    Past Medical History, Surgical history, Social history, and Family History were reviewed and  updated.  Review of Systems: All other 10 point review of systems is negative.   Physical Exam:  weight is 162 lb 6.4 oz (73.7 kg). His oral temperature is 97.6 F (36.4 C). His blood pressure is 101/63 and his pulse is 59 (abnormal). His respiration is 17 and oxygen saturation is 99%.   Wt Readings from Last 3 Encounters:  12/03/22 162 lb 6.4 oz (73.7 kg)  11/05/22 161 lb 12.8 oz (73.4 kg)  10/11/22 165 lb 12.8 oz (75.2 kg)    Ocular: Sclerae unicteric, pupils equal, round and reactive to light Ear-nose-throat: Oropharynx clear, dentition fair Lymphatic: No cervical or supraclavicular adenopathy Lungs no rales or rhonchi, good excursion bilaterally Heart regular rate and rhythm, no murmur appreciated Abd soft, nontender, positive bowel sounds MSK generalized aches and pains all over, no joint edema Neuro: non-focal, well-oriented, appropriate affect Breasts: Deferred   Lab Results  Component Value Date   WBC 5.9 12/03/2022   HGB 13.1 12/03/2022   HCT 38.9 (L) 12/03/2022   MCV 91.7 12/03/2022   PLT 176 12/03/2022   Lab Results  Component Value Date   FERRITIN 45 10/28/2021   IRON 200 (H) 10/28/2021   TIBC 365 10/28/2021   UIBC 165 10/28/2021   IRONPCTSAT 55 (H) 10/28/2021   Lab Results  Component Value Date   RETICCTPCT 1.5 02/04/2021   RBC 4.24 12/03/2022   Lab Results  Component Value Date   KPAFRELGTCHN 13.8 11/05/2022   LAMBDASER 16.7 11/05/2022   KAPLAMBRATIO 0.83 11/05/2022   Lab Results  Component Value Date   IGGSERUM 287 (L) 11/05/2022   IGA 29 (L) 11/05/2022   IGMSERUM 8 (L) 11/05/2022   Lab Results  Component Value Date   TOTALPROTELP 5.8 (L) 11/05/2022   ALBUMINELP 3.6 11/05/2022   A1GS 0.2 11/05/2022   A2GS 0.8 11/05/2022   BETS 0.9 11/05/2022   GAMS 0.2 (L) 11/05/2022   MSPIKE Not Observed 11/05/2022   SPEI Comment 11/05/2022     Chemistry      Component Value Date/Time   NA 141 11/19/2022 1005   NA 140 08/31/2021 1026   K 5.0  11/19/2022 1005   CL 107 11/19/2022 1005   CO2 29 11/19/2022 1005   BUN 24 (H) 11/19/2022 1005   BUN 21 08/31/2021 1026   CREATININE 1.92 (H) 11/19/2022 1005      Component Value Date/Time   CALCIUM 10.0 11/19/2022 1005   ALKPHOS 46 11/19/2022 1005   AST 29 11/19/2022 1005   ALT 42 11/19/2022 1005   BILITOT 0.5 11/19/2022 1005       Impression and Plan: Marvin Salinas is a pleasant 77 yo caucasian  gentleman with lambda light chains myeloma with mild renal insufficiency  Serum protein studies are pending.  I spoke with Dr. Myna Hidalgo regarding his persistent bone pain and we will get a PET scan to evaluate.  Treatment every 2 weeks, follow-up in 4 weeks.   Eileen Stanford, NP 6/14/202411:14 AM

## 2022-12-03 NOTE — Progress Notes (Signed)
Reviewed pt labs with Dr. Ennever and pt ok to treat with creatinine 1.97 

## 2022-12-03 NOTE — Patient Instructions (Signed)
[facility]  Discharge Instructions: Thank you for choosing [facility] to provide your oncology and hematology care.   If you have a lab appointment with the Cancer Center, please go directly to the Cancer Center and check in at the registration area.  Wear comfortable clothing and clothing appropriate for easy access to any Portacath or PICC line.   We strive to give you quality time with your provider. You may need to reschedule your appointment if you arrive late (15 or more minutes).  Arriving late affects you and other patients whose appointments are after yours.  Also, if you miss three or more appointments without notifying the office, you may be dismissed from the clinic at the provider's discretion.      For prescription refill requests, have your pharmacy contact our office and allow 72 hours for refills to be completed.    Today you received the following chemotherapy and/or immunotherapy agents Velcade and Faspro   To help prevent nausea and vomiting after your treatment, we encourage you to take your nausea medication as directed.  BELOW ARE SYMPTOMS THAT SHOULD BE REPORTED IMMEDIATELY: *FEVER GREATER THAN 100.4 F (38 C) OR HIGHER *CHILLS OR SWEATING *NAUSEA AND VOMITING THAT IS NOT CONTROLLED WITH YOUR NAUSEA MEDICATION *UNUSUAL SHORTNESS OF BREATH *UNUSUAL BRUISING OR BLEEDING *URINARY PROBLEMS (pain or burning when urinating, or frequent urination) *BOWEL PROBLEMS (unusual diarrhea, constipation, pain near the anus) TENDERNESS IN MOUTH AND THROAT WITH OR WITHOUT PRESENCE OF ULCERS (sore throat, sores in mouth, or a toothache) UNUSUAL RASH, SWELLING OR PAIN  UNUSUAL VAGINAL DISCHARGE OR ITCHING   Items with * indicate a potential emergency and should be followed up as soon as possible or go to the Emergency Department if any problems should occur.  Please show the CHEMOTHERAPY ALERT CARD or IMMUNOTHERAPY ALERT CARD at  check-in to the Emergency Department and triage nurse. Should you have questions after your visit or need to cancel or reschedule your appointment, please contact [facility]  [phone] and follow the prompts.  Office hours are 8:00 a.m. to 4:30 p.m. Monday - Friday. Please note that voicemails left after 4:00 p.m. may not be returned until the following business day.  We are closed weekends and major holidays. You have access to a nurse at all times for urgent questions. Please call the main number to the clinic [phone] and follow the prompts.  For any non-urgent questions, you may also contact your provider using MyChart. We now offer e-Visits for anyone 18 and older to request care online for non-urgent symptoms. For details visit [email or web address].   Also download the MyChart app! Go to the app store, search "MyChart", open the app, select , and log in with your MyChart username and password.  

## 2022-12-05 LAB — IGG, IGA, IGM
IgA: 26 mg/dL — ABNORMAL LOW (ref 61–437)
IgG (Immunoglobin G), Serum: 296 mg/dL — ABNORMAL LOW (ref 603–1613)
IgM (Immunoglobulin M), Srm: 7 mg/dL — ABNORMAL LOW (ref 15–143)

## 2022-12-06 LAB — KAPPA/LAMBDA LIGHT CHAINS
Kappa free light chain: 13.9 mg/L (ref 3.3–19.4)
Kappa, lambda light chain ratio: 0.9 (ref 0.26–1.65)
Lambda free light chains: 15.5 mg/L (ref 5.7–26.3)

## 2022-12-07 LAB — PROTEIN ELECTROPHORESIS, SERUM
A/G Ratio: 1.7 (ref 0.7–1.7)
Albumin ELP: 3.8 g/dL (ref 2.9–4.4)
Alpha-1-Globulin: 0.2 g/dL (ref 0.0–0.4)
Alpha-2-Globulin: 0.8 g/dL (ref 0.4–1.0)
Beta Globulin: 0.9 g/dL (ref 0.7–1.3)
Gamma Globulin: 0.3 g/dL — ABNORMAL LOW (ref 0.4–1.8)
Globulin, Total: 2.2 g/dL (ref 2.2–3.9)
Total Protein ELP: 6 g/dL (ref 6.0–8.5)

## 2022-12-17 ENCOUNTER — Inpatient Hospital Stay: Payer: 59

## 2022-12-17 VITALS — BP 120/63 | HR 68 | Temp 97.9°F | Resp 17

## 2022-12-17 DIAGNOSIS — C9000 Multiple myeloma not having achieved remission: Secondary | ICD-10-CM | POA: Diagnosis not present

## 2022-12-17 DIAGNOSIS — Z5112 Encounter for antineoplastic immunotherapy: Secondary | ICD-10-CM | POA: Diagnosis not present

## 2022-12-17 DIAGNOSIS — Z79899 Other long term (current) drug therapy: Secondary | ICD-10-CM | POA: Diagnosis not present

## 2022-12-17 LAB — CMP (CANCER CENTER ONLY)
ALT: 26 U/L (ref 0–44)
AST: 18 U/L (ref 15–41)
Albumin: 4.3 g/dL (ref 3.5–5.0)
Alkaline Phosphatase: 49 U/L (ref 38–126)
Anion gap: 7 (ref 5–15)
BUN: 27 mg/dL — ABNORMAL HIGH (ref 8–23)
CO2: 28 mmol/L (ref 22–32)
Calcium: 9.7 mg/dL (ref 8.9–10.3)
Chloride: 105 mmol/L (ref 98–111)
Creatinine: 1.6 mg/dL — ABNORMAL HIGH (ref 0.61–1.24)
GFR, Estimated: 44 mL/min — ABNORMAL LOW (ref >60)
Glucose, Bld: 123 mg/dL — ABNORMAL HIGH (ref 70–99)
Potassium: 4.4 mmol/L (ref 3.5–5.1)
Sodium: 140 mmol/L (ref 135–145)
Total Bilirubin: 0.4 mg/dL (ref 0.3–1.2)
Total Protein: 6.4 g/dL — ABNORMAL LOW (ref 6.5–8.1)

## 2022-12-17 LAB — CBC WITH DIFFERENTIAL (CANCER CENTER ONLY)
Abs Immature Granulocytes: 0.02 10*3/uL (ref 0.00–0.07)
Basophils Absolute: 0 10*3/uL (ref 0.0–0.1)
Basophils Relative: 1 %
Eosinophils Absolute: 0.1 10*3/uL (ref 0.0–0.5)
Eosinophils Relative: 1 %
HCT: 38.7 % — ABNORMAL LOW (ref 39.0–52.0)
Hemoglobin: 13.1 g/dL (ref 13.0–17.0)
Immature Granulocytes: 0 %
Lymphocytes Relative: 17 %
Lymphs Abs: 1.4 10*3/uL (ref 0.7–4.0)
MCH: 31.4 pg (ref 26.0–34.0)
MCHC: 33.9 g/dL (ref 30.0–36.0)
MCV: 92.8 fL (ref 80.0–100.0)
Monocytes Absolute: 0.6 10*3/uL (ref 0.1–1.0)
Monocytes Relative: 8 %
Neutro Abs: 6 10*3/uL (ref 1.7–7.7)
Neutrophils Relative %: 73 %
Platelet Count: 147 10*3/uL — ABNORMAL LOW (ref 150–400)
RBC: 4.17 MIL/uL — ABNORMAL LOW (ref 4.22–5.81)
RDW: 13.6 % (ref 11.5–15.5)
WBC Count: 8.1 10*3/uL (ref 4.0–10.5)
nRBC: 0 % (ref 0.0–0.2)

## 2022-12-17 MED ORDER — LORAZEPAM 1 MG PO TABS
0.5000 mg | ORAL_TABLET | Freq: Once | ORAL | Status: AC
  Administered 2022-12-17: 0.5 mg via ORAL
  Filled 2022-12-17: qty 1

## 2022-12-17 MED ORDER — BORTEZOMIB CHEMO SQ INJECTION 3.5 MG (2.5MG/ML)
1.3000 mg/m2 | Freq: Once | INTRAMUSCULAR | Status: AC
  Administered 2022-12-17: 2.5 mg via SUBCUTANEOUS
  Filled 2022-12-17: qty 1

## 2022-12-17 NOTE — Progress Notes (Signed)
Ok to treat with Creatinine 1.6 per Dr. Myna Hidalgo

## 2022-12-17 NOTE — Patient Instructions (Signed)
[facility]  Discharge Instructions: Thank you for choosing [facility] to provide your oncology and hematology care.   If you have a lab appointment with the Cancer Center, please go directly to the Cancer Center and check in at the registration area.  Wear comfortable clothing and clothing appropriate for easy access to any Portacath or PICC line.   We strive to give you quality time with your provider. You may need to reschedule your appointment if you arrive late (15 or more minutes).  Arriving late affects you and other patients whose appointments are after yours.  Also, if you miss three or more appointments without notifying the office, you may be dismissed from the clinic at the provider's discretion.      For prescription refill requests, have your pharmacy contact our office and allow 72 hours for refills to be completed.    Today you received the following chemotherapy and/or immunotherapy agents Velcade.      To help prevent nausea and vomiting after your treatment, we encourage you to take your nausea medication as directed.  BELOW ARE SYMPTOMS THAT SHOULD BE REPORTED IMMEDIATELY: *FEVER GREATER THAN 100.4 F (38 C) OR HIGHER *CHILLS OR SWEATING *NAUSEA AND VOMITING THAT IS NOT CONTROLLED WITH YOUR NAUSEA MEDICATION *UNUSUAL SHORTNESS OF BREATH *UNUSUAL BRUISING OR BLEEDING *URINARY PROBLEMS (pain or burning when urinating, or frequent urination) *BOWEL PROBLEMS (unusual diarrhea, constipation, pain near the anus) TENDERNESS IN MOUTH AND THROAT WITH OR WITHOUT PRESENCE OF ULCERS (sore throat, sores in mouth, or a toothache) UNUSUAL RASH, SWELLING OR PAIN  UNUSUAL VAGINAL DISCHARGE OR ITCHING   Items with * indicate a potential emergency and should be followed up as soon as possible or go to the Emergency Department if any problems should occur.  Please show the CHEMOTHERAPY ALERT CARD or IMMUNOTHERAPY ALERT CARD at check-in  to the Emergency Department and triage nurse. Should you have questions after your visit or need to cancel or reschedule your appointment, please contact [facility]  [phone] and follow the prompts.  Office hours are 8:00 a.m. to 4:30 p.m. Monday - Friday. Please note that voicemails left after 4:00 p.m. may not be returned until the following business day.  We are closed weekends and major holidays. You have access to a nurse at all times for urgent questions. Please call the main number to the clinic [phone] and follow the prompts.  For any non-urgent questions, you may also contact your provider using MyChart. We now offer e-Visits for anyone 18 and older to request care online for non-urgent symptoms. For details visit [email or web address].   Also download the MyChart app! Go to the app store, search "MyChart", open the app, select , and log in with your MyChart username and password.   

## 2022-12-20 ENCOUNTER — Ambulatory Visit (HOSPITAL_COMMUNITY)
Admission: RE | Admit: 2022-12-20 | Discharge: 2022-12-20 | Disposition: A | Discharge status: Home / Self Care | Payer: 59 | Source: Ambulatory Visit | Attending: Cardiology | Admitting: Cardiology

## 2022-12-20 ENCOUNTER — Ambulatory Visit (HOSPITAL_BASED_OUTPATIENT_CLINIC_OR_DEPARTMENT_OTHER)
Admission: RE | Admit: 2022-12-20 | Discharge: 2022-12-20 | Disposition: A | Discharge status: Home / Self Care | Payer: 59 | Source: Ambulatory Visit | Attending: Cardiovascular Disease | Admitting: Cardiovascular Disease

## 2022-12-20 DIAGNOSIS — Z95828 Presence of other vascular implants and grafts: Secondary | ICD-10-CM | POA: Diagnosis not present

## 2022-12-20 DIAGNOSIS — I739 Peripheral vascular disease, unspecified: Secondary | ICD-10-CM | POA: Diagnosis not present

## 2022-12-20 LAB — VAS US ABI WITH/WO TBI

## 2022-12-24 ENCOUNTER — Other Ambulatory Visit: Payer: Self-pay | Admitting: *Deleted

## 2022-12-24 DIAGNOSIS — I739 Peripheral vascular disease, unspecified: Secondary | ICD-10-CM

## 2022-12-27 NOTE — Progress Notes (Unsigned)
Cardiology Office Note   Date:  12/28/2022   ID:  Marvin Salinas, DOB 01-05-1946, MRN [id number]  PCP:  Natalia Leatherwood, DO  Cardiologist:  Dr. Elease Hashimoto  No chief complaint on file.      History of Present Illness: Marvin Salinas is a 77 y.o. male who is here today for a follow-up visit regarding peripheral arterial disease.  He has known history of coronary artery disease status post CABG in 2001, paroxysmal atrial fibrillation on anticoagulation, essential hypertension, carotid disease, hyperlipidemia, diabetes and prior TIAs. Patient has known history of peripheral arterial disease .  He does have underlying chronic kidney disease with creatinine between 1.6-2.  The patient is followed for peripheral arterial disease with previous severe claudication.  Angiography in December 2020 showed heavily calcified subtotal occlusion of the left common iliac artery with moderate disease affecting the right common iliac artery.  I performed successful angioplasty and and balloon expandable stent placement to the left common iliac artery.   He has known history of low back pain and bilateral knee joint pain.    He has known severe lumbar radiculopathy.   He is currently being treated for multiple myeloma with chronic kidney disease.    He underwent recent Doppler studies which showed noncompressible vessels with patent left iliac stent with stable moderately elevated velocities in the right iliac artery.  Been doing reasonably well overall.  He quit smoking in January.  He denies chest pain or shortness of breath.  He has chronic numbness of both legs.   Past Medical History:  Diagnosis Date   Acute kidney injury superimposed on chronic kidney disease (HCC) 10/12/2017   Atrial fibrillation with RVR (HCC) 10/27/2017   Back pain    Basilar artery stenosis    on chronic Plavix   Benign prostatic hypertrophy without urinary obstruction 04/17/2014   Carotid artery disease (HCC) 10/06/2010    Carotid US 04/2019: Bilat ICA 40-59; L subclavian stenosis  // Carotid US 11/21: Bilat ICA 40-59; bilateral subclavian stenosis // Carotid US 11/22: Bilateral ICA 40-59; left subclavian stenosis // Carotid US 04/28/2022: Bilateral ICA 40-59; left subclavian stenosis   Cerebral vascular disease    with prior TIA's; followed by Dr. Sandria Manly   Depression, neurotic 11/21/2013   Diabetes mellitus    on insulin   Enthesopathy of ankle and tarsus 09/04/2007   Overview:  Metatarsalgia    Enthesopathy of ankle and tarsus 09/04/2007   Overview:  Metatarsalgia  Formatting of this note might be different from the original. Metatarsalgia  10/1 IMO update   History of renal calculi    Hyperlipidemia    Hypertension    Ischemic heart disease    prior PCI to RCA in 1989. S/P PCI to LAD and OM in 1992. S/P PCI to first DX in 2000. S/P CABG x 3 in May 2011   Lambda light chain myeloma (HCC) 10/28/2021   Left hip pain 01/03/2020   OSA (obstructive sleep apnea) 01/11/2018   AHI 18.1 and SaO2 low 73%   PAD (peripheral artery disease) (HCC) 11/18/2017   ABIs/Arterial US 04/2019: R 1.21; L 0.66 // R SFA 30-49, stable > 50 CIA and EIA stenosis; L > 50 CIA stenosis (likely represents severe stenosis or short segment occlusion)   Peripheral neuropathy    Pneumonia 02/2011   Sacroiliac joint dysfunction of left side 01/03/2020   Stroke (HCC) 04/16/2001   small right cerebellar infarct on 04/16/2001 at that time he was found to  have proximal left vertebral artery, proximal left common carotid artery and both external carotid artery stenosis as well as intracranial stenosis involving mid basilar artery- 07/2011 add questionable TIA    Past Surgical History:  Procedure Laterality Date    NASAL ENDOSCOPY  01/11/2020   chronic rhinitis w/o evidence of acute sinusitis, bilateral inferior turbinate hypertrophy   ABDOMINAL AORTOGRAM W/LOWER EXTREMITY Bilateral 05/30/2019   Procedure: ABDOMINAL AORTOGRAM W/LOWER EXTREMITY;   Surgeon: Iran Ouch, MD;  Location: [facility];  Service: Cardiovascular;  Laterality: Bilateral;   ANGIOPLASTY  1989   right coronary artery   ANGIOPLASTY  1992   LAD and OM   ANGIOPLASTY  1998   First DX   BRAIN SURGERY     on prior records   CORONARY ARTERY BYPASS GRAFT  11/12/2009   LIMA to LAD, SVG to OM and SVG to RCA   CORONARY STENT PLACEMENT  2000   Stent to LAD/Circumflex with angioplasty to first diagonal    LEFT HEART CATH AND CORS/GRAFTS ANGIOGRAPHY N/A 10/13/2017   Procedure: LEFT HEART CATH AND CORS/GRAFTS ANGIOGRAPHY;  Surgeon: Yvonne Kendall, MD;  Location: [facility];  Service: Cardiovascular;  Laterality: N/A;     Current Outpatient Medications  Medication Sig Dispense Refill   acetaminophen (TYLENOL) 500 MG tablet Take 1,000 mg by mouth every 6 (six) hours as needed.     apixaban (ELIQUIS) 5 MG TABS tablet Take 1 tablet (5 mg total) by mouth 2 (two) times daily. 180 tablet 1   Carboxymethylcell-Hypromellose (GENTEAL OP) Place 1 drop into both eyes daily as needed (dry eyes).     dofetilide (TIKOSYN) 125 MCG capsule Take 1 capsule (125 mcg total) by mouth 2 (two) times daily. 180 capsule 0   ezetimibe (ZETIA) 10 MG tablet Take 1 tablet (10 mg total) by mouth daily. 90 tablet 3   famciclovir (FAMVIR) 250 MG tablet Take 1 tablet (250 mg total) by mouth daily. 30 tablet 12   glucose blood (ONETOUCH VERIO) test strip USE UP TO FOUR TIMES DAILY AS DIRECTED. 100 strip 3   insulin glargine (LANTUS) 100 UNIT/ML Solostar Pen Inject 40 Units into the skin 2 (two) times daily. 90 mL 1   Insulin Pen Needle 31G X 4 MM MISC Use BID with lantus 100 each 11   Lancets (ONETOUCH DELICA PLUS LANCET33G) MISC USE UP TO FOUR TIMES DAILY AS DIRECTED 100 each 11   loratadine (CLARITIN) 10 MG tablet Take 10 mg by mouth daily as needed for allergies.     metoprolol tartrate (LOPRESSOR) 25 MG tablet Take 0.5 tablets (12.5 mg total) by mouth 2 (two) times daily. 90  tablet 3   olmesartan (BENICAR) 40 MG tablet Take 10 mg by mouth 2 (two) times daily.     ondansetron (ZOFRAN) 8 MG tablet Take 1 tablet (8 mg total) by mouth every 8 (eight) hours as needed for nausea or vomiting. 30 tablet 3   polyethylene glycol powder (GLYCOLAX/MIRALAX) 17 GM/SCOOP powder Take by mouth every other day.     rosuvastatin (CRESTOR) 20 MG tablet Take 1 tablet (20 mg total) by mouth at bedtime. 90 tablet 3   Semaglutide (RYBELSUS) 14 MG TABS Take 1 tablet (14 mg total) by mouth daily. 90 tablet 1   Insulin Pen Needle (PEN NEEDLES) 31G X 6 MM MISC 1 Application by Does not apply route at bedtime. (Patient not taking: Reported on 12/28/2022) 100 each 2   lenalidomide (REVLIMID) 10 MG capsule Take  1 capsule (10 mg total) by mouth daily. [employer] Auth #[id number].  21 days on and 7 days off. (Patient not taking: Reported on 11/05/2022) 21 capsule 0   nitroGLYCERIN (NITROSTAT) 0.4 MG SL tablet Place 0.4 mg under the tongue every 5 (five) minutes as needed for chest pain. (Patient not taking: Reported on 12/28/2022)     Semaglutide (RYBELSUS) 7 MG TABS TAKE 1 TABLET BY MOUTH EVERY DAY WITH BREAKFAST (Patient not taking: Reported on 11/05/2022) 90 tablet 1   No current facility-administered medications for this visit.    Allergies:   Codeine, Lisinopril, Nsaids, Dapagliflozin, and Latex    Social History:  The patient  reports that he quit smoking about 5 months ago. His smoking use included cigarettes. He has a 13.50 pack-year smoking history. He has never used smokeless tobacco. He reports that he does not drink alcohol and does not use drugs.   Family History:  The patient's family history includes Asthma in his brother; Depression in his mother; Diabetes in his brother and sister; Early death in his mother; Heart attack in his father; Heart disease in his father; Hypertension in his father; Kidney disease in his mother; Ovarian cancer in his mother; Stroke in an other family member.    ROS:   Please see the history of present illness.   Otherwise, review of systems are positive for none.   All other systems are reviewed and negative.    PHYSICAL EXAM: VS:  BP 124/64   Pulse 70   Ht 5\' 8"  (1.727 m)   Wt 164 lb 6.4 oz (74.6 kg)   SpO2 96%   BMI 25.00 kg/m  , BMI Body mass index is 25 kg/m. GEN: Well nourished, well developed, in no acute distress  HEENT: normal  Neck: no JVD,  or masses .  Bilateral carotid bruits. Cardiac: RRR; no murmurs, rubs, or gallops,no edema  Respiratory:  clear to auscultation bilaterally, normal work of breathing GI: soft, nontender, nondistended, + BS MS: no deformity or atrophy  Skin: warm and dry, no rash Neuro:  Strength and sensation are intact Psych: euthymic mood, full affect  Vascular: Distal pulses are not palpable.  EKG:  EKG is not ordered today.   Recent Labs: 12/17/2022: ALT 26; BUN 27; Creatinine 1.60; Hemoglobin 13.1; Platelet Count 147; Potassium 4.4; Sodium 140    Lipid Panel    Component Value Date/Time   CHOL 120 08/27/2022 1429   TRIG 102 08/27/2022 1429   HDL 60 08/27/2022 1429   CHOLHDL 2.0 08/27/2022 1429   CHOLHDL 3 10/23/2020 1112   VLDL 25.0 10/23/2020 1112   LDLCALC 41 08/27/2022 1429      Wt Readings from Last 3 Encounters:  12/28/22 164 lb 6.4 oz (74.6 kg)  12/03/22 162 lb 6.4 oz (73.7 kg)  11/05/22 161 lb 12.8 oz (73.4 kg)           11/30/2016    7:59 AM  PAD Screen  Previous PAD dx? Yes  Previous surgical procedure? No  Pain with walking? Yes  Subsides with rest? No  Feet/toe relief with dangling? Yes  Painful, non-healing ulcers? No  Extremities discolored? No      ASSESSMENT AND PLAN:  1.  Peripheral arterial disease: Status post  stent placement to the left common iliac artery for subtotal occlusion.   Recent Doppler showed patent stent.  Continue medical therapy and repeat studies in 1 year.  2. Coronary artery disease without angina: Continue medical therapy.  He is  not on  antiplatelet therapy given that he is on anticoagulation with Eliquis.  3. Tobacco use: He quit smoking in January.  4. Carotid disease: Most recent carotid Doppler showed stable moderate bilateral disease.  He wants all his Doppler studies to be done at the same time and thus we will move his next carotid Doppler to July of next year.  5. Hyperlipidemia: Most recent Doppler showed an LDL of 41.  Continue rosuvastatin and ezetimibe.  6.  Persistent atrial fibrillation: Maintaining in sinus rhythm with dofetilide.  On long-term anticoagulation with Eliquis.   Disposition:   FU with me in 12 month.   Signed,  Lorine Bears, MD  12/28/2022 8:06 AM    [facility]

## 2022-12-28 ENCOUNTER — Ambulatory Visit: Payer: 59 | Admitting: Cardiovascular Disease

## 2022-12-28 ENCOUNTER — Encounter: Payer: Self-pay | Admitting: Cardiovascular Disease

## 2022-12-28 VITALS — BP 124/64 | HR 70 | Ht 68.0 in | Wt 164.4 lb

## 2022-12-28 DIAGNOSIS — E785 Hyperlipidemia, unspecified: Secondary | ICD-10-CM | POA: Diagnosis not present

## 2022-12-28 DIAGNOSIS — I6523 Occlusion and stenosis of bilateral carotid arteries: Secondary | ICD-10-CM | POA: Diagnosis not present

## 2022-12-28 DIAGNOSIS — I4819 Other persistent atrial fibrillation: Secondary | ICD-10-CM

## 2022-12-28 DIAGNOSIS — I739 Peripheral vascular disease, unspecified: Secondary | ICD-10-CM | POA: Diagnosis not present

## 2022-12-28 DIAGNOSIS — I2510 Atherosclerotic heart disease of native coronary artery without angina pectoris: Secondary | ICD-10-CM | POA: Diagnosis not present

## 2022-12-28 NOTE — Patient Instructions (Signed)
Medication Instructions:  No changes *If you need a refill on your cardiac medications before your next appointment, please call your pharmacy*   Lab Work: None ordered If you have labs (blood work) drawn today and your tests are completely normal, you will receive your results only by: MyChart Message (if you have MyChart) OR A paper copy in the mail If you have any lab test that is abnormal or we need to change your treatment, we will call you to review the results.   Testing/Procedures: Dopplers to be done in July 2025   Follow-Up: At [facility], you and your health needs are our priority.  As part of our continuing mission to provide you with exceptional heart care, we have created designated Provider Care Teams.  These Care Teams include your primary Cardiologist (physician) and Advanced Practice Providers (APPs -  Physician Assistants and Nurse Practitioners) who all work together to provide you with the care you need, when you need it.  We recommend signing up for the patient portal called "MyChart".  Sign up information is provided on this After Visit Summary.  MyChart is used to connect with patients for Virtual Visits (Telemedicine).  Patients are able to view lab/test results, encounter notes, upcoming appointments, etc.  Non-urgent messages can be sent to your provider as well.   To learn more about what you can do with MyChart, go to [email or web address].    Your next appointment:   12 month(s)  Provider:   Dr. Kirke Corin

## 2022-12-31 ENCOUNTER — Inpatient Hospital Stay: Payer: 59

## 2022-12-31 ENCOUNTER — Inpatient Hospital Stay: Payer: 59 | Attending: Family

## 2022-12-31 VITALS — BP 159/61 | HR 58 | Temp 97.8°F | Resp 18

## 2022-12-31 DIAGNOSIS — Z5112 Encounter for antineoplastic immunotherapy: Secondary | ICD-10-CM | POA: Insufficient documentation

## 2022-12-31 DIAGNOSIS — C9000 Multiple myeloma not having achieved remission: Secondary | ICD-10-CM | POA: Insufficient documentation

## 2022-12-31 LAB — CBC WITH DIFFERENTIAL (CANCER CENTER ONLY)
Abs Immature Granulocytes: 0.01 10*3/uL (ref 0.00–0.07)
Basophils Absolute: 0 10*3/uL (ref 0.0–0.1)
Basophils Relative: 1 %
Eosinophils Absolute: 0.1 10*3/uL (ref 0.0–0.5)
Eosinophils Relative: 2 %
HCT: 38 % — ABNORMAL LOW (ref 39.0–52.0)
Hemoglobin: 12.8 g/dL — ABNORMAL LOW (ref 13.0–17.0)
Immature Granulocytes: 0 %
Lymphocytes Relative: 25 %
Lymphs Abs: 1.3 10*3/uL (ref 0.7–4.0)
MCH: 31.3 pg (ref 26.0–34.0)
MCHC: 33.7 g/dL (ref 30.0–36.0)
MCV: 92.9 fL (ref 80.0–100.0)
Monocytes Absolute: 0.5 10*3/uL (ref 0.1–1.0)
Monocytes Relative: 10 %
Neutro Abs: 3.4 10*3/uL (ref 1.7–7.7)
Neutrophils Relative %: 62 %
Platelet Count: 159 10*3/uL (ref 150–400)
RBC: 4.09 MIL/uL — ABNORMAL LOW (ref 4.22–5.81)
RDW: 13.5 % (ref 11.5–15.5)
WBC Count: 5.3 10*3/uL (ref 4.0–10.5)
nRBC: 0 % (ref 0.0–0.2)

## 2022-12-31 LAB — CMP (CANCER CENTER ONLY)
ALT: 25 U/L (ref 0–44)
AST: 19 U/L (ref 15–41)
Albumin: 4.4 g/dL (ref 3.5–5.0)
Alkaline Phosphatase: 51 U/L (ref 38–126)
Anion gap: 6 (ref 5–15)
BUN: 18 mg/dL (ref 8–23)
CO2: 31 mmol/L (ref 22–32)
Calcium: 9.6 mg/dL (ref 8.9–10.3)
Chloride: 105 mmol/L (ref 98–111)
Creatinine: 1.47 mg/dL — ABNORMAL HIGH (ref 0.61–1.24)
GFR, Estimated: 49 mL/min — ABNORMAL LOW (ref >60)
Glucose, Bld: 128 mg/dL — ABNORMAL HIGH (ref 70–99)
Potassium: 4.6 mmol/L (ref 3.5–5.1)
Sodium: 142 mmol/L (ref 135–145)
Total Bilirubin: 0.6 mg/dL (ref 0.3–1.2)
Total Protein: 6.4 g/dL — ABNORMAL LOW (ref 6.5–8.1)

## 2022-12-31 MED ORDER — DEXAMETHASONE 4 MG PO TABS
20.0000 mg | ORAL_TABLET | Freq: Once | ORAL | Status: AC
  Administered 2022-12-31: 20 mg via ORAL
  Filled 2022-12-31: qty 5

## 2022-12-31 MED ORDER — DARATUMUMAB-HYALURONIDASE-FIHJ 1800-30000 MG-UT/15ML ~~LOC~~ SOLN
1800.0000 mg | Freq: Once | SUBCUTANEOUS | Status: AC
  Administered 2022-12-31: 1800 mg via SUBCUTANEOUS
  Filled 2022-12-31: qty 15

## 2022-12-31 MED ORDER — BORTEZOMIB CHEMO SQ INJECTION 3.5 MG (2.5MG/ML)
1.3000 mg/m2 | Freq: Once | INTRAMUSCULAR | Status: AC
  Administered 2022-12-31: 2.5 mg via SUBCUTANEOUS
  Filled 2022-12-31: qty 1

## 2022-12-31 MED ORDER — ACETAMINOPHEN 325 MG PO TABS
650.0000 mg | ORAL_TABLET | Freq: Once | ORAL | Status: AC
  Administered 2022-12-31: 650 mg via ORAL
  Filled 2022-12-31: qty 2

## 2022-12-31 MED ORDER — DIPHENHYDRAMINE HCL 25 MG PO CAPS
50.0000 mg | ORAL_CAPSULE | Freq: Once | ORAL | Status: AC
  Administered 2022-12-31: 50 mg via ORAL
  Filled 2022-12-31: qty 2

## 2022-12-31 MED ORDER — LORAZEPAM 1 MG PO TABS
0.5000 mg | ORAL_TABLET | Freq: Once | ORAL | Status: AC
  Administered 2022-12-31: 0.5 mg via ORAL
  Filled 2022-12-31: qty 1

## 2022-12-31 NOTE — Patient Instructions (Signed)
[facility]  Discharge Instructions: Thank you for choosing [facility] to provide your oncology and hematology care.   If you have a lab appointment with the Cancer Center, please go directly to the Cancer Center and check in at the registration area.  Wear comfortable clothing and clothing appropriate for easy access to any Portacath or PICC line.   We strive to give you quality time with your provider. You may need to reschedule your appointment if you arrive late (15 or more minutes).  Arriving late affects you and other patients whose appointments are after yours.  Also, if you miss three or more appointments without notifying the office, you may be dismissed from the clinic at the provider's discretion.      For prescription refill requests, have your pharmacy contact our office and allow 72 hours for refills to be completed.    Today you received the following chemotherapy and/or immunotherapy agents Velcade, Darzalex-Faspro.      To help prevent nausea and vomiting after your treatment, we encourage you to take your nausea medication as directed.  BELOW ARE SYMPTOMS THAT SHOULD BE REPORTED IMMEDIATELY: *FEVER GREATER THAN 100.4 F (38 C) OR HIGHER *CHILLS OR SWEATING *NAUSEA AND VOMITING THAT IS NOT CONTROLLED WITH YOUR NAUSEA MEDICATION *UNUSUAL SHORTNESS OF BREATH *UNUSUAL BRUISING OR BLEEDING *URINARY PROBLEMS (pain or burning when urinating, or frequent urination) *BOWEL PROBLEMS (unusual diarrhea, constipation, pain near the anus) TENDERNESS IN MOUTH AND THROAT WITH OR WITHOUT PRESENCE OF ULCERS (sore throat, sores in mouth, or a toothache) UNUSUAL RASH, SWELLING OR PAIN  UNUSUAL VAGINAL DISCHARGE OR ITCHING   Items with * indicate a potential emergency and should be followed up as soon as possible or go to the Emergency Department if any problems should occur.  Please show the CHEMOTHERAPY ALERT CARD or IMMUNOTHERAPY ALERT  CARD at check-in to the Emergency Department and triage nurse. Should you have questions after your visit or need to cancel or reschedule your appointment, please contact [facility]  [phone] and follow the prompts.  Office hours are 8:00 a.m. to 4:30 p.m. Monday - Friday. Please note that voicemails left after 4:00 p.m. may not be returned until the following business day.  We are closed weekends and major holidays. You have access to a nurse at all times for urgent questions. Please call the main number to the clinic [phone] and follow the prompts.  For any non-urgent questions, you may also contact your provider using MyChart. We now offer e-Visits for anyone 18 and older to request care online for non-urgent symptoms. For details visit [email or web address].   Also download the MyChart app! Go to the app store, search "MyChart", open the app, select [place], and log in with your MyChart username and password.   

## 2023-01-04 ENCOUNTER — Encounter (HOSPITAL_COMMUNITY)
Admission: RE | Admit: 2023-01-04 | Discharge: 2023-01-04 | Disposition: A | Discharge status: Home / Self Care | Payer: 59 | Source: Ambulatory Visit | Attending: Family | Admitting: Family

## 2023-01-04 DIAGNOSIS — C9000 Multiple myeloma not having achieved remission: Secondary | ICD-10-CM | POA: Insufficient documentation

## 2023-01-04 DIAGNOSIS — M898X9 Other specified disorders of bone, unspecified site: Secondary | ICD-10-CM | POA: Diagnosis not present

## 2023-01-04 LAB — GLUCOSE, CAPILLARY: Glucose-Capillary: 148 mg/dL — ABNORMAL HIGH (ref 70–99)

## 2023-01-04 MED ORDER — FLUDEOXYGLUCOSE F - 18 (FDG) INJECTION
7.7500 | Freq: Once | INTRAVENOUS | Status: AC
  Administered 2023-01-04: 7.75 via INTRAVENOUS

## 2023-01-06 ENCOUNTER — Telehealth: Payer: Self-pay

## 2023-01-06 NOTE — Telephone Encounter (Signed)
Prescription Request  01/06/2023  LOV: Visit date not found  What is the name of the medication or equipment?  rosuvastatin (CRESTOR) 20 MG tablet  Have you contacted your pharmacy to request a refill? No was told to call PCP  Which pharmacy would you like this sent to?   [id number] Lorenza Evangelist, [place] - [address] AT [facility] [address]     Patient notified that their request is being sent to the clinical staff for review and that they should receive a response within 2 business days.   Please advise at Mobile [phone] (mobile)

## 2023-01-06 NOTE — Telephone Encounter (Signed)
Spoke with patient regarding results/recommendations.  

## 2023-01-14 ENCOUNTER — Inpatient Hospital Stay: Payer: 59

## 2023-01-14 ENCOUNTER — Inpatient Hospital Stay: Payer: 59 | Admitting: Hematology & Oncology

## 2023-01-14 ENCOUNTER — Telehealth: Payer: Self-pay

## 2023-01-14 ENCOUNTER — Other Ambulatory Visit: Payer: Self-pay

## 2023-01-14 ENCOUNTER — Encounter: Payer: Self-pay | Admitting: Hematology & Oncology

## 2023-01-14 VITALS — BP 136/59 | HR 62 | Temp 98.0°F | Resp 19 | Ht 68.0 in | Wt 164.1 lb

## 2023-01-14 DIAGNOSIS — C9000 Multiple myeloma not having achieved remission: Secondary | ICD-10-CM

## 2023-01-14 DIAGNOSIS — Z5112 Encounter for antineoplastic immunotherapy: Secondary | ICD-10-CM | POA: Diagnosis not present

## 2023-01-14 LAB — CBC WITH DIFFERENTIAL (CANCER CENTER ONLY)
Abs Immature Granulocytes: 0.02 10*3/uL (ref 0.00–0.07)
Basophils Absolute: 0 10*3/uL (ref 0.0–0.1)
Basophils Relative: 1 %
Eosinophils Absolute: 0.1 10*3/uL (ref 0.0–0.5)
Eosinophils Relative: 1 %
HCT: 37.8 % — ABNORMAL LOW (ref 39.0–52.0)
Hemoglobin: 12.7 g/dL — ABNORMAL LOW (ref 13.0–17.0)
Immature Granulocytes: 0 %
Lymphocytes Relative: 20 %
Lymphs Abs: 1.2 10*3/uL (ref 0.7–4.0)
MCH: 31.3 pg (ref 26.0–34.0)
MCHC: 33.6 g/dL (ref 30.0–36.0)
MCV: 93.1 fL (ref 80.0–100.0)
Monocytes Absolute: 0.5 10*3/uL (ref 0.1–1.0)
Monocytes Relative: 9 %
Neutro Abs: 4.1 10*3/uL (ref 1.7–7.7)
Neutrophils Relative %: 69 %
Platelet Count: 139 10*3/uL — ABNORMAL LOW (ref 150–400)
RBC: 4.06 MIL/uL — ABNORMAL LOW (ref 4.22–5.81)
RDW: 13.2 % (ref 11.5–15.5)
WBC Count: 5.9 10*3/uL (ref 4.0–10.5)
nRBC: 0 % (ref 0.0–0.2)

## 2023-01-14 LAB — LACTATE DEHYDROGENASE: LDH: 182 U/L (ref 98–192)

## 2023-01-14 LAB — CMP (CANCER CENTER ONLY)
ALT: 36 U/L (ref 0–44)
AST: 27 U/L (ref 15–41)
Albumin: 4.4 g/dL (ref 3.5–5.0)
Alkaline Phosphatase: 45 U/L (ref 38–126)
Anion gap: 7 (ref 5–15)
BUN: 19 mg/dL (ref 8–23)
CO2: 30 mmol/L (ref 22–32)
Calcium: 9.5 mg/dL (ref 8.9–10.3)
Chloride: 105 mmol/L (ref 98–111)
Creatinine: 1.58 mg/dL — ABNORMAL HIGH (ref 0.61–1.24)
GFR, Estimated: 45 mL/min — ABNORMAL LOW (ref >60)
Glucose, Bld: 125 mg/dL — ABNORMAL HIGH (ref 70–99)
Potassium: 4.5 mmol/L (ref 3.5–5.1)
Sodium: 142 mmol/L (ref 135–145)
Total Bilirubin: 0.6 mg/dL (ref 0.3–1.2)
Total Protein: 6.2 g/dL — ABNORMAL LOW (ref 6.5–8.1)

## 2023-01-14 MED ORDER — LORAZEPAM 1 MG PO TABS
0.5000 mg | ORAL_TABLET | Freq: Once | ORAL | Status: AC
  Administered 2023-01-14: 0.5 mg via ORAL
  Filled 2023-01-14: qty 1

## 2023-01-14 MED ORDER — B-6 500 MG PO TABS: 500.0000 mg | ORAL_TABLET | Freq: Every day | ORAL | 12 refills | Status: DC

## 2023-01-14 MED ORDER — DULOXETINE HCL 30 MG PO CPEP: 30.0000 mg | ORAL_CAPSULE | Freq: Every day | ORAL | 3 refills | Status: DC

## 2023-01-14 MED ORDER — BORTEZOMIB CHEMO SQ INJECTION 3.5 MG (2.5MG/ML)
1.3000 mg/m2 | Freq: Once | INTRAMUSCULAR | Status: AC
  Administered 2023-01-14: 2.5 mg via SUBCUTANEOUS
  Filled 2023-01-14: qty 1

## 2023-01-14 NOTE — Progress Notes (Signed)
Hematology and Oncology Follow Up Visit  [name] [id number] 1945/07/16 77 y.o. 01/14/2023   Principle Diagnosis:  Lambda light chain myeloma --normal cytogenetics   Current Therapy:        Faspro/Velcade/Revlimid -- started on 11/05/2021 - monthly -- revlimid stopped in 09/2022               Interim History:  [name] is here today for follow-up and treatment.  The last time that he was here, he was complaining of bony pain.  I suspect that this is probably neuropathy that he has from his diabetes.  Of note, we stopped his Revlimid back in April.  I thought he was having problems with the Revlimid.  We did do a PET scan on him.  The PET scan was done on 01/04/2023.  The PET scan did not show any evidence of myelomatous involvement of the bones.  I think it may not be a bad idea to try him on some Cymbalta.  I will try him on 30 mg p.o. daily of Cymbalta.  When we last saw him, there was no monoclonal spike in his blood.  He does have the lambda light chain.  His lambda light chain was 1.6 mg/dL.  This is actually holding steady.  His 24-hour urine that was done back in May showed that he had 12.4 mg per 24 hours of lambda light chain.  He has had no nausea or vomiting.  He has had no obvious change in bowel or bladder habits.  He has had no leg swelling.  Again, he does have diabetes.  Currently, I would say that his performance status is probably ECOG 1-2.   Medications:  Allergies as of 01/14/2023       Reactions   Codeine Nausea And Vomiting   Lisinopril Cough   Nsaids Other (See Comments)   CKD   Dapagliflozin Rash   Latex Hives        Medication List        Accurate as of January 14, 2023  9:55 AM. If you have any questions, ask your nurse or doctor.          STOP taking these medications    lenalidomide 10 MG capsule Commonly known as: REVLIMID Stopped by: Josph Macho       TAKE these medications    acetaminophen 500 MG tablet Commonly  known as: TYLENOL Take 1,000 mg by mouth every 6 (six) hours as needed.   apixaban 5 MG Tabs tablet Commonly known as: Eliquis Take 1 tablet (5 mg total) by mouth 2 (two) times daily.   dofetilide 125 MCG capsule Commonly known as: TIKOSYN Take 1 capsule (125 mcg total) by mouth 2 (two) times daily.   ezetimibe 10 MG tablet Commonly known as: ZETIA Take 1 tablet (10 mg total) by mouth daily.   famciclovir 250 MG tablet Commonly known as: FAMVIR Take 1 tablet (250 mg total) by mouth daily.   GENTEAL OP Place 1 drop into both eyes daily as needed (dry eyes).   insulin glargine 100 UNIT/ML Solostar Pen Commonly known as: LANTUS Inject 40 Units into the skin 2 (two) times daily.   Insulin Pen Needle 31G X 4 MM Misc Use BID with lantus What changed: Another medication with the same name was removed. Continue taking this medication, and follow the directions you see here. Changed by: Josph Macho   loratadine 10 MG tablet Commonly known as: CLARITIN Take 10 mg by  mouth daily as needed for allergies.   metoprolol tartrate 25 MG tablet Commonly known as: LOPRESSOR Take 0.5 tablets (12.5 mg total) by mouth 2 (two) times daily.   nitroGLYCERIN 0.4 MG SL tablet Commonly known as: NITROSTAT Place 0.4 mg under the tongue every 5 (five) minutes as needed for chest pain.   olmesartan 40 MG tablet Commonly known as: BENICAR Take 10 mg by mouth 2 (two) times daily.   ondansetron 8 MG tablet Commonly known as: ZOFRAN Take 1 tablet (8 mg total) by mouth every 8 (eight) hours as needed for nausea or vomiting.   OneTouch Delica Plus Lancet33G Misc USE UP TO FOUR TIMES DAILY AS DIRECTED   OneTouch Verio test strip Generic drug: glucose blood USE UP TO FOUR TIMES DAILY AS DIRECTED.   polyethylene glycol powder 17 GM/SCOOP powder Commonly known as: GLYCOLAX/MIRALAX Take by mouth every other day.   rosuvastatin 20 MG tablet Commonly known as: CRESTOR Take 1 tablet (20 mg  total) by mouth at bedtime.   Rybelsus 14 MG Tabs Generic drug: Semaglutide Take 1 tablet (14 mg total) by mouth daily. What changed: Another medication with the same name was removed. Continue taking this medication, and follow the directions you see here. Changed by: Josph Macho        Allergies:  Allergies  Allergen Reactions   Codeine Nausea And Vomiting   Lisinopril Cough   Nsaids Other (See Comments)    CKD   Dapagliflozin Rash   Latex Hives    Past Medical History, Surgical history, Social history, and Family History were reviewed and updated.  Review of Systems: Review of Systems  Constitutional: Negative.   HENT: Negative.    Eyes: Negative.   Respiratory: Negative.    Cardiovascular: Negative.   Gastrointestinal: Negative.   Genitourinary: Negative.   Musculoskeletal:  Positive for joint pain and myalgias.  Skin: Negative.   Neurological: Negative.   Endo/Heme/Allergies: Negative.   Psychiatric/Behavioral: Negative.       Physical Exam:  height is 5\' 8"  (1.727 m) and weight is 164 lb 1.9 oz (74.4 kg). His oral temperature is 98 F (36.7 C). His blood pressure is 136/59 (abnormal) and his pulse is 62. His respiration is 19 and oxygen saturation is 100%.   Wt Readings from Last 3 Encounters:  01/14/23 164 lb 1.9 oz (74.4 kg)  12/28/22 164 lb 6.4 oz (74.6 kg)  12/03/22 162 lb 6.4 oz (73.7 kg)   Physical Exam Vitals reviewed.  HENT:     Head: Normocephalic and atraumatic.  Eyes:     Pupils: Pupils are equal, round, and reactive to light.  Cardiovascular:     Rate and Rhythm: Normal rate and regular rhythm.     Heart sounds: Normal heart sounds.  Pulmonary:     Effort: Pulmonary effort is normal.     Breath sounds: Normal breath sounds.  Abdominal:     General: Bowel sounds are normal.     Palpations: Abdomen is soft.  Musculoskeletal:        General: No tenderness or deformity. Normal range of motion.     Cervical back: Normal range of  motion.  Lymphadenopathy:     Cervical: No cervical adenopathy.  Skin:    General: Skin is warm and dry.     Findings: No erythema or rash.  Neurological:     Mental Status: He is alert and oriented to person, place, and time.  Psychiatric:  Behavior: Behavior normal.        Thought Content: Thought content normal.        Judgment: Judgment normal.      Lab Results  Component Value Date   WBC 5.9 01/14/2023   HGB 12.7 (L) 01/14/2023   HCT 37.8 (L) 01/14/2023   MCV 93.1 01/14/2023   PLT 139 (L) 01/14/2023   Lab Results  Component Value Date   FERRITIN 45 10/28/2021   IRON 200 (H) 10/28/2021   TIBC 365 10/28/2021   UIBC 165 10/28/2021   IRONPCTSAT 55 (H) 10/28/2021   Lab Results  Component Value Date   RETICCTPCT 1.5 02/04/2021   RBC 4.06 (L) 01/14/2023   Lab Results  Component Value Date   KPAFRELGTCHN 13.9 12/03/2022   LAMBDASER 15.5 12/03/2022   KAPLAMBRATIO 0.90 12/03/2022   Lab Results  Component Value Date   IGGSERUM 296 (L) 12/03/2022   IGA 26 (L) 12/03/2022   IGMSERUM 7 (L) 12/03/2022   Lab Results  Component Value Date   TOTALPROTELP 6.0 12/03/2022   ALBUMINELP 3.8 12/03/2022   A1GS 0.2 12/03/2022   A2GS 0.8 12/03/2022   BETS 0.9 12/03/2022   GAMS 0.3 (L) 12/03/2022   MSPIKE Not Observed 12/03/2022   SPEI Comment 12/03/2022     Chemistry      Component Value Date/Time   NA 142 01/14/2023 0845   NA 140 08/31/2021 1026   K 4.5 01/14/2023 0845   CL 105 01/14/2023 0845   CO2 30 01/14/2023 0845   BUN 19 01/14/2023 0845   BUN 21 08/31/2021 1026   CREATININE 1.58 (H) 01/14/2023 0845      Component Value Date/Time   CALCIUM 9.5 01/14/2023 0845   ALKPHOS 45 01/14/2023 0845   AST 27 01/14/2023 0845   ALT 36 01/14/2023 0845   BILITOT 0.6 01/14/2023 0845       Impression and Plan: Mr. Loschiavo is a pleasant 77 yo caucasian gentleman with lambda light chains myeloma with mild renal insufficiency.  We are adjusting his protocol a little  bit.  Again, he has some neuropathy.  We will see if the Cymbalta might help with this.  We we will continue to follow the Lambda light chains.  We will plan to get him back on 01/29/2023 for his next cycle of treatment.   Josph Macho, MD 7/26/20249:55 AM

## 2023-01-14 NOTE — Telephone Encounter (Signed)
Received fax from pharmacy stating that the Vitamin B6 prescription max dose it comes in is 100 mg. Current prescription is for 500 mg daily -- ok to increase quantity to 150 tablets for 30 days. Spoke with Dr Myna Hidalgo who states he thought there was a higher dose than 100 mg. Ok to change Rx to 300 mg a day instead of 500 mg. Called pharmacy and made them aware of the change in dose and quantity (90 tablets for 1 month with 12 refills). Pharmacy stated understanding.

## 2023-01-14 NOTE — Patient Instructions (Signed)
[facility]  Discharge Instructions: Thank you for choosing [facility] to provide your oncology and hematology care.   If you have a lab appointment with the Cancer Center, please go directly to the Cancer Center and check in at the registration area.  Wear comfortable clothing and clothing appropriate for easy access to any Portacath or PICC line.   We strive to give you quality time with your provider. You may need to reschedule your appointment if you arrive late (15 or more minutes).  Arriving late affects you and other patients whose appointments are after yours.  Also, if you miss three or more appointments without notifying the office, you may be dismissed from the clinic at the provider's discretion.      For prescription refill requests, have your pharmacy contact our office and allow 72 hours for refills to be completed.    Today you received the following chemotherapy and/or immunotherapy agents Velcade.      To help prevent nausea and vomiting after your treatment, we encourage you to take your nausea medication as directed.  BELOW ARE SYMPTOMS THAT SHOULD BE REPORTED IMMEDIATELY: *FEVER GREATER THAN 100.4 F (38 C) OR HIGHER *CHILLS OR SWEATING *NAUSEA AND VOMITING THAT IS NOT CONTROLLED WITH YOUR NAUSEA MEDICATION *UNUSUAL SHORTNESS OF BREATH *UNUSUAL BRUISING OR BLEEDING *URINARY PROBLEMS (pain or burning when urinating, or frequent urination) *BOWEL PROBLEMS (unusual diarrhea, constipation, pain near the anus) TENDERNESS IN MOUTH AND THROAT WITH OR WITHOUT PRESENCE OF ULCERS (sore throat, sores in mouth, or a toothache) UNUSUAL RASH, SWELLING OR PAIN  UNUSUAL VAGINAL DISCHARGE OR ITCHING   Items with * indicate a potential emergency and should be followed up as soon as possible or go to the Emergency Department if any problems should occur.  Please show the CHEMOTHERAPY ALERT CARD or IMMUNOTHERAPY ALERT CARD at check-in  to the Emergency Department and triage nurse. Should you have questions after your visit or need to cancel or reschedule your appointment, please contact [facility]  [phone] and follow the prompts.  Office hours are 8:00 a.m. to 4:30 p.m. Monday - Friday. Please note that voicemails left after 4:00 p.m. may not be returned until the following business day.  We are closed weekends and major holidays. You have access to a nurse at all times for urgent questions. Please call the main number to the clinic [phone] and follow the prompts.  For any non-urgent questions, you may also contact your provider using MyChart. We now offer e-Visits for anyone 18 and older to request care online for non-urgent symptoms. For details visit [email or web address].   Also download the MyChart app! Go to the app store, search "MyChart", open the app, select [facility], and log in with your MyChart username and password.   

## 2023-01-14 NOTE — Progress Notes (Signed)
Ok to treat with creatinine of 1.58 per Dr Marin Olp. dph

## 2023-01-20 ENCOUNTER — Other Ambulatory Visit: Payer: Self-pay

## 2023-01-20 MED ORDER — DOFETILIDE 125 MCG PO CAPS: 125.0000 ug | ORAL_CAPSULE | Freq: Two times a day (BID) | ORAL | 2 refills | Status: DC

## 2023-01-28 ENCOUNTER — Inpatient Hospital Stay: Payer: 59 | Attending: Family

## 2023-01-28 ENCOUNTER — Inpatient Hospital Stay: Payer: 59

## 2023-01-28 ENCOUNTER — Encounter: Payer: Self-pay | Admitting: Hematology & Oncology

## 2023-01-28 ENCOUNTER — Other Ambulatory Visit: Payer: Self-pay

## 2023-01-28 ENCOUNTER — Inpatient Hospital Stay: Payer: 59 | Admitting: Hematology & Oncology

## 2023-01-28 VITALS — BP 110/60 | HR 69 | Temp 97.7°F | Resp 18 | Ht 68.0 in | Wt 162.0 lb

## 2023-01-28 DIAGNOSIS — Z5112 Encounter for antineoplastic immunotherapy: Secondary | ICD-10-CM | POA: Insufficient documentation

## 2023-01-28 DIAGNOSIS — C9000 Multiple myeloma not having achieved remission: Secondary | ICD-10-CM | POA: Insufficient documentation

## 2023-01-28 DIAGNOSIS — Z79899 Other long term (current) drug therapy: Secondary | ICD-10-CM | POA: Insufficient documentation

## 2023-01-28 LAB — CBC WITH DIFFERENTIAL (CANCER CENTER ONLY)
Abs Immature Granulocytes: 0.01 10*3/uL (ref 0.00–0.07)
Basophils Absolute: 0 10*3/uL (ref 0.0–0.1)
Basophils Relative: 1 %
Eosinophils Absolute: 0.1 10*3/uL (ref 0.0–0.5)
Eosinophils Relative: 1 %
HCT: 38.5 % — ABNORMAL LOW (ref 39.0–52.0)
Hemoglobin: 12.8 g/dL — ABNORMAL LOW (ref 13.0–17.0)
Immature Granulocytes: 0 %
Lymphocytes Relative: 23 %
Lymphs Abs: 1.4 10*3/uL (ref 0.7–4.0)
MCH: 31.1 pg (ref 26.0–34.0)
MCHC: 33.2 g/dL (ref 30.0–36.0)
MCV: 93.7 fL (ref 80.0–100.0)
Monocytes Absolute: 0.6 10*3/uL (ref 0.1–1.0)
Monocytes Relative: 9 %
Neutro Abs: 4.1 10*3/uL (ref 1.7–7.7)
Neutrophils Relative %: 66 %
Platelet Count: 207 10*3/uL (ref 150–400)
RBC: 4.11 MIL/uL — ABNORMAL LOW (ref 4.22–5.81)
RDW: 13.5 % (ref 11.5–15.5)
WBC Count: 6.2 10*3/uL (ref 4.0–10.5)
nRBC: 0 % (ref 0.0–0.2)

## 2023-01-28 LAB — LACTATE DEHYDROGENASE: LDH: 163 U/L (ref 98–192)

## 2023-01-28 LAB — CMP (CANCER CENTER ONLY)
ALT: 17 U/L (ref 0–44)
AST: 22 U/L (ref 15–41)
Albumin: 4.4 g/dL (ref 3.5–5.0)
Alkaline Phosphatase: 45 U/L (ref 38–126)
Anion gap: 8 (ref 5–15)
BUN: 22 mg/dL (ref 8–23)
CO2: 28 mmol/L (ref 22–32)
Calcium: 9.4 mg/dL (ref 8.9–10.3)
Chloride: 106 mmol/L (ref 98–111)
Creatinine: 1.56 mg/dL — ABNORMAL HIGH (ref 0.61–1.24)
GFR, Estimated: 45 mL/min — ABNORMAL LOW (ref >60)
Glucose, Bld: 119 mg/dL — ABNORMAL HIGH (ref 70–99)
Potassium: 4.2 mmol/L (ref 3.5–5.1)
Sodium: 142 mmol/L (ref 135–145)
Total Bilirubin: 0.5 mg/dL (ref 0.3–1.2)
Total Protein: 6.5 g/dL (ref 6.5–8.1)

## 2023-01-28 MED ORDER — ACETAMINOPHEN 325 MG PO TABS
650.0000 mg | ORAL_TABLET | Freq: Once | ORAL | Status: AC
  Administered 2023-01-28: 650 mg via ORAL
  Filled 2023-01-28: qty 2

## 2023-01-28 MED ORDER — DARATUMUMAB-HYALURONIDASE-FIHJ 1800-30000 MG-UT/15ML ~~LOC~~ SOLN
1800.0000 mg | Freq: Once | SUBCUTANEOUS | Status: AC
  Administered 2023-01-28: 1800 mg via SUBCUTANEOUS
  Filled 2023-01-28: qty 15

## 2023-01-28 MED ORDER — DIPHENHYDRAMINE HCL 25 MG PO CAPS
50.0000 mg | ORAL_CAPSULE | Freq: Once | ORAL | Status: AC
  Administered 2023-01-28: 50 mg via ORAL
  Filled 2023-01-28: qty 2

## 2023-01-28 MED ORDER — LORAZEPAM 1 MG PO TABS
0.5000 mg | ORAL_TABLET | Freq: Once | ORAL | Status: AC
  Administered 2023-01-28: 0.5 mg via ORAL
  Filled 2023-01-28: qty 1

## 2023-01-28 MED ORDER — DEXAMETHASONE 4 MG PO TABS
20.0000 mg | ORAL_TABLET | Freq: Once | ORAL | Status: AC
  Administered 2023-01-28: 20 mg via ORAL
  Filled 2023-01-28: qty 5

## 2023-01-28 MED ORDER — BORTEZOMIB CHEMO SQ INJECTION 3.5 MG (2.5MG/ML)
1.3000 mg/m2 | Freq: Once | INTRAMUSCULAR | Status: AC
  Administered 2023-01-28: 2.5 mg via SUBCUTANEOUS
  Filled 2023-01-28: qty 1

## 2023-01-28 NOTE — Progress Notes (Unsigned)
Hematology and Oncology Follow Up Visit  Marvin Salinas [id number] 13-Nov-1945 77 y.o. 01/28/2023   Principle Diagnosis:  Lambda light chain myeloma --normal cytogenetics   Current Therapy:        Faspro/Velcade/Revlimid -- started on 11/05/2021 - monthly -- revlimid stopped in 09/2022               Interim History:  Marvin Salinas is here today for follow-up and treatment.  The last time that he was here, he was complaining of bony pain.  I suspect that this is probably neuropathy that he has from his diabetes.  Of note, we stopped his Revlimid back in April.  I thought he was having problems with the Revlimid.  We did do a PET scan on him.  The PET scan was done on 01/04/2023.  The PET scan did not show any evidence of myelomatous involvement of the bones.  We do have him on some Cymbalta.  This may be helping a little bit.  His last lambda light chain back in July was 1.3 mg/dL.  This is holding stable.  His appetite is doing okay.  He has had no nausea or vomiting.  He has had no cough or shortness of breath.  He has had no change in bowel or bladder habits.  He still has a neuropathy.  I am sure this is from his diabetes.  There has been no bleeding.  Overall, I would have said that his performance status is probably ECOG 1.  Medications:  Allergies as of 01/28/2023       Reactions   Codeine Nausea And Vomiting   Lisinopril Cough   Nsaids Other (See Comments)   CKD   Dapagliflozin Rash   Latex Hives        Medication List        Accurate as of January 28, 2023  1:44 PM. If you have any questions, ask your nurse or doctor.          acetaminophen 500 MG tablet Commonly known as: TYLENOL Take 1,000 mg by mouth every 6 (six) hours as needed.   apixaban 5 MG Tabs tablet Commonly known as: Eliquis Take 1 tablet (5 mg total) by mouth 2 (two) times daily.   B-6 500 MG Tabs Take 500 mg by mouth daily after breakfast.   dofetilide 125 MCG capsule Commonly known as:  TIKOSYN Take 1 capsule (125 mcg total) by mouth 2 (two) times daily.   DULoxetine 30 MG capsule Commonly known as: CYMBALTA Take 1 capsule (30 mg total) by mouth daily.   ezetimibe 10 MG tablet Commonly known as: ZETIA Take 1 tablet (10 mg total) by mouth daily.   famciclovir 250 MG tablet Commonly known as: FAMVIR Take 1 tablet (250 mg total) by mouth daily.   GENTEAL OP Place 1 drop into both eyes daily as needed (dry eyes).   insulin glargine 100 UNIT/ML Solostar Pen Commonly known as: LANTUS Inject 40 Units into the skin 2 (two) times daily.   Insulin Pen Needle 31G X 4 MM Misc Use BID with lantus   loratadine 10 MG tablet Commonly known as: CLARITIN Take 10 mg by mouth daily as needed for allergies.   metoprolol tartrate 25 MG tablet Commonly known as: LOPRESSOR Take 0.5 tablets (12.5 mg total) by mouth 2 (two) times daily.   nitroGLYCERIN 0.4 MG SL tablet Commonly known as: NITROSTAT Place 0.4 mg under the tongue every 5 (five) minutes as needed for chest pain.  olmesartan 40 MG tablet Commonly known as: BENICAR Take 10 mg by mouth 2 (two) times daily.   ondansetron 8 MG tablet Commonly known as: ZOFRAN Take 1 tablet (8 mg total) by mouth every 8 (eight) hours as needed for nausea or vomiting.   OneTouch Delica Plus Lancet33G Misc USE UP TO FOUR TIMES DAILY AS DIRECTED   OneTouch Verio test strip Generic drug: glucose blood USE UP TO FOUR TIMES DAILY AS DIRECTED.   polyethylene glycol powder 17 GM/SCOOP powder Commonly known as: GLYCOLAX/MIRALAX Take by mouth every other day.   rosuvastatin 20 MG tablet Commonly known as: CRESTOR Take 1 tablet (20 mg total) by mouth at bedtime.   Rybelsus 14 MG Tabs Generic drug: Semaglutide Take 1 tablet (14 mg total) by mouth daily.        Allergies:  Allergies  Allergen Reactions   Codeine Nausea And Vomiting   Lisinopril Cough   Nsaids Other (See Comments)    CKD   Dapagliflozin Rash   Latex Hives     Past Medical History, Surgical history, Social history, and Family History were reviewed and updated.  Review of Systems: Review of Systems  Constitutional: Negative.   HENT: Negative.    Eyes: Negative.   Respiratory: Negative.    Cardiovascular: Negative.   Gastrointestinal: Negative.   Genitourinary: Negative.   Musculoskeletal:  Positive for joint pain and myalgias.  Skin: Negative.   Neurological: Negative.   Endo/Heme/Allergies: Negative.   Psychiatric/Behavioral: Negative.       Physical Exam:  height is 5\' 8"  (1.727 m) and weight is 162 lb (73.5 kg). His oral temperature is 97.7 F (36.5 C). His blood pressure is 110/60 and his pulse is 69. His respiration is 18 and oxygen saturation is 98%.   Wt Readings from Last 3 Encounters:  01/28/23 162 lb (73.5 kg)  01/14/23 164 lb 1.9 oz (74.4 kg)  12/28/22 164 lb 6.4 oz (74.6 kg)   Physical Exam Vitals reviewed.  HENT:     Head: Normocephalic and atraumatic.  Eyes:     Pupils: Pupils are equal, round, and reactive to light.  Cardiovascular:     Rate and Rhythm: Normal rate and regular rhythm.     Heart sounds: Normal heart sounds.  Pulmonary:     Effort: Pulmonary effort is normal.     Breath sounds: Normal breath sounds.  Abdominal:     General: Bowel sounds are normal.     Palpations: Abdomen is soft.  Musculoskeletal:        General: No tenderness or deformity. Normal range of motion.     Cervical back: Normal range of motion.  Lymphadenopathy:     Cervical: No cervical adenopathy.  Skin:    General: Skin is warm and dry.     Findings: No erythema or rash.  Neurological:     Mental Status: He is alert and oriented to person, place, and time.  Psychiatric:        Behavior: Behavior normal.        Thought Content: Thought content normal.        Judgment: Judgment normal.     Lab Results  Component Value Date   WBC 6.2 01/28/2023   HGB 12.8 (L) 01/28/2023   HCT 38.5 (L) 01/28/2023   MCV 93.7  01/28/2023   PLT 207 01/28/2023   Lab Results  Component Value Date   FERRITIN 45 10/28/2021   IRON 200 (H) 10/28/2021   TIBC 365 10/28/2021  UIBC 165 10/28/2021   IRONPCTSAT 55 (H) 10/28/2021   Lab Results  Component Value Date   RETICCTPCT 1.5 02/04/2021   RBC 4.11 (L) 01/28/2023   Lab Results  Component Value Date   KPAFRELGTCHN 11.7 01/14/2023   LAMBDASER 13.4 01/14/2023   KAPLAMBRATIO 0.87 01/14/2023   Lab Results  Component Value Date   IGGSERUM 328 (L) 01/14/2023   IGA 21 (L) 01/14/2023   IGMSERUM <5 (L) 01/14/2023   Lab Results  Component Value Date   TOTALPROTELP 5.9 (L) 01/14/2023   ALBUMINELP 3.4 01/14/2023   A1GS 0.3 01/14/2023   A2GS 0.9 01/14/2023   BETS 1.1 01/14/2023   GAMS 0.2 (L) 01/14/2023   MSPIKE 0.1 (H) 01/14/2023   SPEI Comment 01/14/2023     Chemistry      Component Value Date/Time   NA 142 01/28/2023 0919   NA 140 08/31/2021 1026   K 4.2 01/28/2023 0919   CL 106 01/28/2023 0919   CO2 28 01/28/2023 0919   BUN 22 01/28/2023 0919   BUN 21 08/31/2021 1026   CREATININE 1.56 (H) 01/28/2023 0919      Component Value Date/Time   CALCIUM 9.4 01/28/2023 0919   ALKPHOS 45 01/28/2023 0919   AST 22 01/28/2023 0919   ALT 17 01/28/2023 0919   BILITOT 0.5 01/28/2023 0919       Impression and Plan: Marvin Salinas is a pleasant 77 yo caucasian gentleman with lambda light chains myeloma with mild renal insufficiency.  Overall, his myeloma seems to be holding pretty steady.  I am happy that the PET scan that had done about a month ago did not show any active myelomatous bone lesions.  His urine looks good with a very low level of light chain.  If renal function is holding steady.  For right now, we will not make any changes with his protocol.  We will plan to get back to see [place] in another month.    Josph Macho, MD 8/9/20241:44 PM

## 2023-01-28 NOTE — Progress Notes (Signed)
Per Dr. Myna Hidalgo, Okay to treat today despite labs

## 2023-01-28 NOTE — Patient Instructions (Signed)
[facility]  Discharge Instructions: Thank you for choosing [facility] to provide your oncology and hematology care.   If you have a lab appointment with the Cancer Center, please go directly to the Cancer Center and check in at the registration area.  Wear comfortable clothing and clothing appropriate for easy access to any Portacath or PICC line.   We strive to give you quality time with your provider. You may need to reschedule your appointment if you arrive late (15 or more minutes).  Arriving late affects you and other patients whose appointments are after yours.  Also, if you miss three or more appointments without notifying the office, you may be dismissed from the clinic at the provider's discretion.      For prescription refill requests, have your pharmacy contact our office and allow 72 hours for refills to be completed.    Today you received the following chemotherapy and/or immunotherapy agents darzalex, velcade      To help prevent nausea and vomiting after your treatment, we encourage you to take your nausea medication as directed.  BELOW ARE SYMPTOMS THAT SHOULD BE REPORTED IMMEDIATELY: *FEVER GREATER THAN 100.4 F (38 C) OR HIGHER *CHILLS OR SWEATING *NAUSEA AND VOMITING THAT IS NOT CONTROLLED WITH YOUR NAUSEA MEDICATION *UNUSUAL SHORTNESS OF BREATH *UNUSUAL BRUISING OR BLEEDING *URINARY PROBLEMS (pain or burning when urinating, or frequent urination) *BOWEL PROBLEMS (unusual diarrhea, constipation, pain near the anus) TENDERNESS IN MOUTH AND THROAT WITH OR WITHOUT PRESENCE OF ULCERS (sore throat, sores in mouth, or a toothache) UNUSUAL RASH, SWELLING OR PAIN  UNUSUAL VAGINAL DISCHARGE OR ITCHING   Items with * indicate a potential emergency and should be followed up as soon as possible or go to the Emergency Department if any problems should occur.  Please show the CHEMOTHERAPY ALERT CARD or IMMUNOTHERAPY ALERT CARD at  check-in to the Emergency Department and triage nurse. Should you have questions after your visit or need to cancel or reschedule your appointment, please contact [facility]  [phone] and follow the prompts.  Office hours are 8:00 a.m. to 4:30 p.m. Monday - Friday. Please note that voicemails left after 4:00 p.m. may not be returned until the following business day.  We are closed weekends and major holidays. You have access to a nurse at all times for urgent questions. Please call the main number to the clinic [phone] and follow the prompts.  For any non-urgent questions, you may also contact your provider using MyChart. We now offer e-Visits for anyone 61 and older to request care online for non-urgent symptoms. For details visit [email or web address].   Also download the MyChart app! Go to the app store, search "MyChart", open the app, select , and log in with your MyChart username and password.

## 2023-01-29 ENCOUNTER — Encounter: Payer: Self-pay | Admitting: Hematology & Oncology

## 2023-01-29 ENCOUNTER — Encounter: Payer: Self-pay | Admitting: Family

## 2023-02-03 ENCOUNTER — Encounter (INDEPENDENT_AMBULATORY_CARE_PROVIDER_SITE_OTHER): Payer: Self-pay

## 2023-02-11 ENCOUNTER — Inpatient Hospital Stay: Payer: 59

## 2023-02-11 VITALS — BP 148/47 | HR 58 | Temp 97.5°F | Resp 19

## 2023-02-11 DIAGNOSIS — C9000 Multiple myeloma not having achieved remission: Secondary | ICD-10-CM

## 2023-02-11 DIAGNOSIS — Z5112 Encounter for antineoplastic immunotherapy: Secondary | ICD-10-CM | POA: Diagnosis not present

## 2023-02-11 DIAGNOSIS — Z79899 Other long term (current) drug therapy: Secondary | ICD-10-CM | POA: Diagnosis not present

## 2023-02-11 LAB — CBC WITH DIFFERENTIAL (CANCER CENTER ONLY)
Abs Immature Granulocytes: 0.02 10*3/uL (ref 0.00–0.07)
Basophils Absolute: 0 10*3/uL (ref 0.0–0.1)
Basophils Relative: 1 %
Eosinophils Absolute: 0.1 10*3/uL (ref 0.0–0.5)
Eosinophils Relative: 1 %
HCT: 36.2 % — ABNORMAL LOW (ref 39.0–52.0)
Hemoglobin: 12.1 g/dL — ABNORMAL LOW (ref 13.0–17.0)
Immature Granulocytes: 0 %
Lymphocytes Relative: 19 %
Lymphs Abs: 1.2 10*3/uL (ref 0.7–4.0)
MCH: 31.6 pg (ref 26.0–34.0)
MCHC: 33.4 g/dL (ref 30.0–36.0)
MCV: 94.5 fL (ref 80.0–100.0)
Monocytes Absolute: 0.4 10*3/uL (ref 0.1–1.0)
Monocytes Relative: 7 %
Neutro Abs: 4.7 10*3/uL (ref 1.7–7.7)
Neutrophils Relative %: 72 %
Platelet Count: 144 10*3/uL — ABNORMAL LOW (ref 150–400)
RBC: 3.83 MIL/uL — ABNORMAL LOW (ref 4.22–5.81)
RDW: 13.4 % (ref 11.5–15.5)
WBC Count: 6.5 10*3/uL (ref 4.0–10.5)
nRBC: 0 % (ref 0.0–0.2)

## 2023-02-11 LAB — CMP (CANCER CENTER ONLY)
ALT: 56 U/L — ABNORMAL HIGH (ref 0–44)
AST: 32 U/L (ref 15–41)
Albumin: 4.4 g/dL (ref 3.5–5.0)
Alkaline Phosphatase: 44 U/L (ref 38–126)
Anion gap: 6 (ref 5–15)
BUN: 18 mg/dL (ref 8–23)
CO2: 29 mmol/L (ref 22–32)
Calcium: 9.3 mg/dL (ref 8.9–10.3)
Chloride: 106 mmol/L (ref 98–111)
Creatinine: 1.59 mg/dL — ABNORMAL HIGH (ref 0.61–1.24)
GFR, Estimated: 44 mL/min — ABNORMAL LOW (ref >60)
Glucose, Bld: 109 mg/dL — ABNORMAL HIGH (ref 70–99)
Potassium: 4.6 mmol/L (ref 3.5–5.1)
Sodium: 141 mmol/L (ref 135–145)
Total Bilirubin: 0.5 mg/dL (ref 0.3–1.2)
Total Protein: 6.1 g/dL — ABNORMAL LOW (ref 6.5–8.1)

## 2023-02-11 MED ORDER — BORTEZOMIB CHEMO SQ INJECTION 3.5 MG (2.5MG/ML)
1.3000 mg/m2 | Freq: Once | INTRAMUSCULAR | Status: AC
  Administered 2023-02-11: 2.5 mg via SUBCUTANEOUS
  Filled 2023-02-11: qty 1

## 2023-02-11 MED ORDER — LORAZEPAM 1 MG PO TABS
0.5000 mg | ORAL_TABLET | Freq: Once | ORAL | Status: AC
  Administered 2023-02-11: 0.5 mg via ORAL
  Filled 2023-02-11: qty 1

## 2023-02-11 NOTE — Progress Notes (Signed)
Per Dr. Myna Hidalgo, okay to treat today despite creatinine results

## 2023-02-11 NOTE — Patient Instructions (Signed)
Bortezomib Injection What is this medication? BORTEZOMIB (bor TEZ oh mib) treats lymphoma. It may also be used to treat multiple myeloma, a type of bone marrow cancer. It works by blocking a protein that causes cancer cells to grow and multiply. This helps to slow or stop the spread of cancer cells. This medicine may be used for other purposes; ask your health care provider or pharmacist if you have questions. COMMON BRAND NAME(S): Velcade What should I tell my care team before I take this medication? They need to know if you have any of these conditions: Dehydration Diabetes Heart disease Liver disease Tingling of the fingers or toes or other nerve disorder An unusual or allergic reaction to bortezomib, other medications, foods, dyes, or preservatives If you or your partner are pregnant or trying to get pregnant Breastfeeding How should I use this medication? This medication is injected into a vein or under the skin. It is given by your care team in a hospital or clinic setting. Talk to your care team about the use of this medication in children. Special care may be needed. Overdosage: If you think you have taken too much of this medicine contact a poison control center or emergency room at once. NOTE: This medicine is only for you. Do not share this medicine with others. What if I miss a dose? Keep appointments for follow-up doses. It is important not to miss your dose. Call your care team if you are unable to keep an appointment. What may interact with this medication? Ketoconazole Rifampin This list may not describe all possible interactions. Give your health care provider a list of all the medicines, herbs, non-prescription drugs, or dietary supplements you use. Also tell them if you smoke, drink alcohol, or use illegal drugs. Some items may interact with your medicine. What should I watch for while using this medication? Your condition will be monitored carefully while you are  receiving this medication. You may need blood work while taking this medication. This medication may affect your coordination, reaction time, or judgment. Do not drive or operate machinery until you know how this medication affects you. Sit up or stand slowly to reduce the risk of dizzy or fainting spells. Drinking alcohol with this medication can increase the risk of these side effects. This medication may increase your risk of getting an infection. Call your care team for advice if you get a fever, chills, sore throat, or other symptoms of a cold or flu. Do not treat yourself. Try to avoid being around people who are sick. Check with your care team if you have severe diarrhea, nausea, and vomiting, or if you sweat a lot. The loss of too much body fluid may make it dangerous for you to take this medication. Talk to your care team if you may be pregnant. Serious birth defects can occur if you take this medication during pregnancy and for 7 months after the last dose. You will need a negative pregnancy test before starting this medication. Contraception is recommended while taking this medication and for 7 months after the last dose. Your care team can help you find the option that works for you. If your partner can get pregnant, use a condom during sex while taking this medication and for 4 months after the last dose. Do not breastfeed while taking this medication and for 2 months after the last dose. This medication may cause infertility. Talk to your care team if you are concerned about your fertility. What side effects   may I notice from receiving this medication? Side effects that you should report to your care team as soon as possible: Allergic reactions--skin rash, itching, hives, swelling of the face, lips, tongue, or throat Bleeding--bloody or black, tar-like stools, vomiting blood or brown material that looks like coffee grounds, red or dark brown urine, small red or purple spots on skin, unusual  bruising or bleeding Bleeding in the brain--severe headache, stiff neck, confusion, dizziness, change in vision, numbness or weakness of the face, arm, or leg, trouble speaking, trouble walking, vomiting Bowel blockage--stomach cramping, unable to have a bowel movement or pass gas, loss of appetite, vomiting Heart failure--shortness of breath, swelling of the ankles, feet, or hands, sudden weight gain, unusual weakness or fatigue Infection--fever, chills, cough, sore throat, wounds that don't heal, pain or trouble when passing urine, general feeling of discomfort or being unwell Liver injury--right upper belly pain, loss of appetite, nausea, light-colored stool, dark yellow or brown urine, yellowing skin or eyes, unusual weakness or fatigue Low blood pressure--dizziness, feeling faint or lightheaded, blurry vision Lung injury--shortness of breath or trouble breathing, cough, spitting up blood, chest pain, fever Pain, tingling, or numbness in the hands or feet Severe or prolonged diarrhea Stomach pain, bloody diarrhea, pale skin, unusual weakness or fatigue, decrease in the amount of urine, which may be signs of hemolytic uremic syndrome Sudden and severe headache, confusion, change in vision, seizures, which may be signs of posterior reversible encephalopathy syndrome (PRES) TTP--purple spots on the skin or inside the mouth, pale skin, yellowing skin or eyes, unusual weakness or fatigue, fever, fast or irregular heartbeat, confusion, change in vision, trouble speaking, trouble walking Tumor lysis syndrome (TLS)--nausea, vomiting, diarrhea, decrease in the amount of urine, dark urine, unusual weakness or fatigue, confusion, muscle pain or cramps, fast or irregular heartbeat, joint pain Side effects that usually do not require medical attention (report to your care team if they continue or are bothersome): Constipation Diarrhea Fatigue Loss of appetite Nausea This list may not describe all possible  side effects. Call your doctor for medical advice about side effects. You may report side effects to FDA at 1-800-FDA-1088. Where should I keep my medication? This medication is given in a hospital or clinic. It will not be stored at home. NOTE: This sheet is a summary. It may not cover all possible information. If you have questions about this medicine, talk to your doctor, pharmacist, or health care provider.  2024 Elsevier/Gold Standard (2021-11-10 00:00:00)  

## 2023-02-15 ENCOUNTER — Other Ambulatory Visit: Payer: Self-pay

## 2023-02-15 MED ORDER — INSULIN GLARGINE 100 UNIT/ML SOLOSTAR PEN: 40.0000 [IU] | PEN_INJECTOR | Freq: Two times a day (BID) | SUBCUTANEOUS | 1 refills | Status: DC

## 2023-02-18 DIAGNOSIS — R809 Proteinuria, unspecified: Secondary | ICD-10-CM | POA: Diagnosis not present

## 2023-02-18 DIAGNOSIS — N189 Chronic kidney disease, unspecified: Secondary | ICD-10-CM | POA: Diagnosis not present

## 2023-02-18 DIAGNOSIS — E1122 Type 2 diabetes mellitus with diabetic chronic kidney disease: Secondary | ICD-10-CM | POA: Diagnosis not present

## 2023-02-18 DIAGNOSIS — D696 Thrombocytopenia, unspecified: Secondary | ICD-10-CM | POA: Diagnosis not present

## 2023-02-18 DIAGNOSIS — E1129 Type 2 diabetes mellitus with other diabetic kidney complication: Secondary | ICD-10-CM | POA: Diagnosis not present

## 2023-02-22 ENCOUNTER — Other Ambulatory Visit: Payer: Self-pay | Admitting: Cardiovascular Disease

## 2023-02-22 DIAGNOSIS — I4819 Other persistent atrial fibrillation: Secondary | ICD-10-CM

## 2023-02-22 NOTE — Telephone Encounter (Signed)
Please review

## 2023-02-22 NOTE — Telephone Encounter (Signed)
Eliquis 5mg  refill request received. Patient is 77 years old, weight-73.5kg, Crea-1.59 on 02/11/23, Diagnosis-Afib, and last seen by Dr. Kirke Corin on 12/28/22. Dose is appropriate based on dosing criteria. Will send in refill to requested pharmacy.

## 2023-02-24 DIAGNOSIS — E1122 Type 2 diabetes mellitus with diabetic chronic kidney disease: Secondary | ICD-10-CM | POA: Diagnosis not present

## 2023-02-24 DIAGNOSIS — E1129 Type 2 diabetes mellitus with other diabetic kidney complication: Secondary | ICD-10-CM | POA: Diagnosis not present

## 2023-02-24 DIAGNOSIS — D638 Anemia in other chronic diseases classified elsewhere: Secondary | ICD-10-CM | POA: Diagnosis not present

## 2023-02-24 DIAGNOSIS — R809 Proteinuria, unspecified: Secondary | ICD-10-CM | POA: Diagnosis not present

## 2023-02-25 ENCOUNTER — Inpatient Hospital Stay: Payer: 59

## 2023-02-25 ENCOUNTER — Encounter: Payer: Self-pay | Admitting: Hematology & Oncology

## 2023-02-25 ENCOUNTER — Other Ambulatory Visit: Payer: Self-pay

## 2023-02-25 ENCOUNTER — Inpatient Hospital Stay: Payer: 59 | Attending: Family | Admitting: Hematology & Oncology

## 2023-02-25 VITALS — BP 101/64 | HR 65 | Temp 97.5°F | Resp 18 | Ht 68.0 in | Wt 154.8 lb

## 2023-02-25 DIAGNOSIS — C9000 Multiple myeloma not having achieved remission: Secondary | ICD-10-CM

## 2023-02-25 LAB — LACTATE DEHYDROGENASE: LDH: 158 U/L (ref 98–192)

## 2023-02-25 LAB — CBC WITH DIFFERENTIAL (CANCER CENTER ONLY)
Abs Immature Granulocytes: 0.01 10*3/uL (ref 0.00–0.07)
Basophils Absolute: 0 10*3/uL (ref 0.0–0.1)
Basophils Relative: 0 %
Eosinophils Absolute: 0.1 10*3/uL (ref 0.0–0.5)
Eosinophils Relative: 1 %
HCT: 35 % — ABNORMAL LOW (ref 39.0–52.0)
Hemoglobin: 12 g/dL — ABNORMAL LOW (ref 13.0–17.0)
Immature Granulocytes: 0 %
Lymphocytes Relative: 19 %
Lymphs Abs: 1.1 10*3/uL (ref 0.7–4.0)
MCH: 31.9 pg (ref 26.0–34.0)
MCHC: 34.3 g/dL (ref 30.0–36.0)
MCV: 93.1 fL (ref 80.0–100.0)
Monocytes Absolute: 0.6 10*3/uL (ref 0.1–1.0)
Monocytes Relative: 10 %
Neutro Abs: 3.9 10*3/uL (ref 1.7–7.7)
Neutrophils Relative %: 70 %
Platelet Count: 156 10*3/uL (ref 150–400)
RBC: 3.76 MIL/uL — ABNORMAL LOW (ref 4.22–5.81)
RDW: 13.2 % (ref 11.5–15.5)
WBC Count: 5.6 10*3/uL (ref 4.0–10.5)
nRBC: 0 % (ref 0.0–0.2)

## 2023-02-25 LAB — CMP (CANCER CENTER ONLY)
ALT: 26 U/L (ref 0–44)
AST: 23 U/L (ref 15–41)
Albumin: 4.3 g/dL (ref 3.5–5.0)
Alkaline Phosphatase: 43 U/L (ref 38–126)
Anion gap: 7 (ref 5–15)
BUN: 24 mg/dL — ABNORMAL HIGH (ref 8–23)
CO2: 28 mmol/L (ref 22–32)
Calcium: 9.6 mg/dL (ref 8.9–10.3)
Chloride: 106 mmol/L (ref 98–111)
Creatinine: 1.66 mg/dL — ABNORMAL HIGH (ref 0.61–1.24)
GFR, Estimated: 42 mL/min — ABNORMAL LOW (ref >60)
Glucose, Bld: 147 mg/dL — ABNORMAL HIGH (ref 70–99)
Potassium: 4.5 mmol/L (ref 3.5–5.1)
Sodium: 141 mmol/L (ref 135–145)
Total Bilirubin: 0.6 mg/dL (ref 0.3–1.2)
Total Protein: 6.1 g/dL — ABNORMAL LOW (ref 6.5–8.1)

## 2023-02-25 MED ORDER — GABAPENTIN 300 MG PO CAPS: 300.0000 mg | ORAL_CAPSULE | Freq: Three times a day (TID) | ORAL | 3 refills | Status: DC

## 2023-02-25 NOTE — Progress Notes (Signed)
Hematology and Oncology Follow Up Visit  Marvin Salinas [id number] Apr 06, 1946 77 y.o. 02/25/2023   Principle Diagnosis:  Lambda light chain myeloma --normal cytogenetics   Current Therapy:        Faspro/Velcade/Revlimid -- started on 11/05/2021 - monthly -- revlimid stopped in 09/2022               Interim History:  Marvin Salinas is here today for follow-up and treatment.  Unfortunately, he is going have a lot of problems.  He has a lot of problems with pain.  I am not sure what might the etiology of the pain.  We did do a PET scan on him back in July.  The PET scan was negative for any obvious bony lesions for myeloma.  No his diabetes.  He was put on Cymbalta.  He apparently had problems with Cymbalta and stopped taking this.  I think can be apparent that we are going to have to hold his treatment for right now.  Actually, he really is doing fairly well.  I think we have the "flexibility" to be able to hold his treatments for right now.  When we last saw him back in August, his monoclonal spike was 0.1 g/dL.  His lambda light chain was 1.4 mg/dL.  Again, I just want his quality of life to be better.  Hopefully, by stopping his treatments, we can get his quality of life better.  Again I am not sure as to why is having all the problems that he is having.  He has had no fever.  He has had no nausea or vomiting.  There is been no change in bowel or bladder habits.  He has had no obvious bleeding.  Currently, I would say his performance status is probably ECOG 2.   Medications:  Allergies as of 02/25/2023       Reactions   Codeine Nausea And Vomiting   Lisinopril Cough   Nsaids Other (See Comments)   CKD   Dapagliflozin Rash   Latex Hives        Medication List        Accurate as of February 25, 2023 10:11 AM. If you have any questions, ask your nurse or doctor.          STOP taking these medications    B-6 500 MG Tabs Stopped by: Marvin Salinas       TAKE  these medications    acetaminophen 500 MG tablet Commonly known as: TYLENOL Take 1,000 mg by mouth every 6 (six) hours as needed.   dofetilide 125 MCG capsule Commonly known as: TIKOSYN Take 1 capsule (125 mcg total) by mouth 2 (two) times daily.   DULoxetine 30 MG capsule Commonly known as: CYMBALTA Take 1 capsule (30 mg total) by mouth daily.   Eliquis 5 MG Tabs tablet Generic drug: apixaban TAKE 1 TABLET BY MOUTH TWICE DAILY   ezetimibe 10 MG tablet Commonly known as: ZETIA Take 1 tablet (10 mg total) by mouth daily.   famciclovir 250 MG tablet Commonly known as: FAMVIR Take 1 tablet (250 mg total) by mouth daily.   GENTEAL OP Place 1 drop into both eyes daily as needed (dry eyes).   insulin glargine 100 UNIT/ML Solostar Pen Commonly known as: LANTUS Inject 40 Units into the skin 2 (two) times daily.   Insulin Pen Needle 31G X 4 MM Misc Use BID with lantus   loratadine 10 MG tablet Commonly known as: CLARITIN Take 10 mg  by mouth daily as needed for allergies.   metoprolol tartrate 25 MG tablet Commonly known as: LOPRESSOR Take 0.5 tablets (12.5 mg total) by mouth 2 (two) times daily.   nitroGLYCERIN 0.4 MG SL tablet Commonly known as: NITROSTAT Place 0.4 mg under the tongue every 5 (five) minutes as needed for chest pain.   olmesartan 5 MG tablet Commonly known as: BENICAR Take 5 mg by mouth daily. What changed: Another medication with the same name was removed. Continue taking this medication, and follow the directions you see here. Changed by: Marvin Salinas   ondansetron 8 MG tablet Commonly known as: ZOFRAN Take 1 tablet (8 mg total) by mouth every 8 (eight) hours as needed for nausea or vomiting.   OneTouch Delica Plus Lancet33G Misc USE UP TO FOUR TIMES DAILY AS DIRECTED   OneTouch Verio test strip Generic drug: glucose blood USE UP TO FOUR TIMES DAILY AS DIRECTED.   polyethylene glycol powder 17 GM/SCOOP powder Commonly known as:  GLYCOLAX/MIRALAX Take by mouth every other day.   rosuvastatin 20 MG tablet Commonly known as: CRESTOR Take 1 tablet (20 mg total) by mouth at bedtime.   Rybelsus 14 MG Tabs Generic drug: Semaglutide Take 1 tablet (14 mg total) by mouth daily.        Allergies:  Allergies  Allergen Reactions   Codeine Nausea And Vomiting   Lisinopril Cough   Nsaids Other (See Comments)    CKD   Dapagliflozin Rash   Latex Hives    Past Medical History, Surgical history, Social history, and Family History were reviewed and updated.  Review of Systems: Review of Systems  Constitutional: Negative.   HENT: Negative.    Eyes: Negative.   Respiratory: Negative.    Cardiovascular: Negative.   Gastrointestinal: Negative.   Genitourinary: Negative.   Musculoskeletal:  Positive for joint pain and myalgias.  Skin: Negative.   Neurological: Negative.   Endo/Heme/Allergies: Negative.   Psychiatric/Behavioral: Negative.       Physical Exam:  height is 5\' 8"  (1.727 m) and weight is 154 lb 12.8 oz (70.2 kg). His oral temperature is 97.5 F (36.4 C) (abnormal). His blood pressure is 101/64 and his pulse is 65. His respiration is 18 and oxygen saturation is 100%.   Wt Readings from Last 3 Encounters:  02/25/23 154 lb 12.8 oz (70.2 kg)  01/28/23 162 lb (73.5 kg)  01/14/23 164 lb 1.9 oz (74.4 kg)   Physical Exam Vitals reviewed.  HENT:     Head: Normocephalic and atraumatic.  Eyes:     Pupils: Pupils are equal, round, and reactive to light.  Cardiovascular:     Rate and Rhythm: Normal rate and regular rhythm.     Heart sounds: Normal heart sounds.  Pulmonary:     Effort: Pulmonary effort is normal.     Breath sounds: Normal breath sounds.  Abdominal:     General: Bowel sounds are normal.     Palpations: Abdomen is soft.  Musculoskeletal:        General: No tenderness or deformity. Normal range of motion.     Cervical back: Normal range of motion.  Lymphadenopathy:     Cervical: No  cervical adenopathy.  Skin:    General: Skin is warm and dry.     Findings: No erythema or rash.  Neurological:     Mental Status: He is alert and oriented to person, place, and time.  Psychiatric:        Behavior: Behavior normal.  Thought Content: Thought content normal.        Judgment: Judgment normal.     Lab Results  Component Value Date   WBC 5.6 02/25/2023   HGB 12.0 (L) 02/25/2023   HCT 35.0 (L) 02/25/2023   MCV 93.1 02/25/2023   PLT 156 02/25/2023   Lab Results  Component Value Date   FERRITIN 45 10/28/2021   IRON 200 (H) 10/28/2021   TIBC 365 10/28/2021   UIBC 165 10/28/2021   IRONPCTSAT 55 (H) 10/28/2021   Lab Results  Component Value Date   RETICCTPCT 1.5 02/04/2021   RBC 3.76 (L) 02/25/2023   Lab Results  Component Value Date   KPAFRELGTCHN 13.2 01/28/2023   LAMBDASER 13.5 01/28/2023   KAPLAMBRATIO 0.98 01/28/2023   Lab Results  Component Value Date   IGGSERUM 272 (L) 01/28/2023   IGA 22 (L) 01/28/2023   IGMSERUM 5 (L) 01/28/2023   Lab Results  Component Value Date   TOTALPROTELP 6.0 01/28/2023   ALBUMINELP 3.9 01/28/2023   A1GS 0.2 01/28/2023   A2GS 0.8 01/28/2023   BETS 0.9 01/28/2023   GAMS 0.2 (L) 01/28/2023   MSPIKE 0.1 (H) 01/28/2023   SPEI Comment 01/14/2023     Chemistry      Component Value Date/Time   NA 141 02/25/2023 0921   NA 140 08/31/2021 1026   K 4.5 02/25/2023 0921   CL 106 02/25/2023 0921   CO2 28 02/25/2023 0921   BUN 24 (H) 02/25/2023 0921   BUN 21 08/31/2021 1026   CREATININE 1.66 (H) 02/25/2023 0921      Component Value Date/Time   CALCIUM 9.6 02/25/2023 0921   ALKPHOS 43 02/25/2023 0921   AST 23 02/25/2023 0921   ALT 26 02/25/2023 0921   BILITOT 0.6 02/25/2023 0921       Impression and Plan: Mr. Wizner is a pleasant 77 yo caucasian gentleman with lambda light chains myeloma with mild renal insufficiency.  For right now, I think the benefit of treatment probably is not outweighed by any risk.   Again he has all these problems.  We will to stop his treatments for right now.  I will somata stop the Crestor.  I know he has been on Crestor for quite a while.  However, the Crestor could certainly be contributing to these arthralgias and myalgias that he has.  I would like to have him come back in about a month.  Hopefully, we will see that he is feeling a little bit better.  Maybe, we would be able to hold off on treatment for quite a while.    Marvin Macho, MD 9/6/202410:11 AM

## 2023-02-27 LAB — IGG, IGA, IGM
IgA: 23 mg/dL — ABNORMAL LOW (ref 61–437)
IgG (Immunoglobin G), Serum: 245 mg/dL — ABNORMAL LOW (ref 603–1613)
IgM (Immunoglobulin M), Srm: 8 mg/dL — ABNORMAL LOW (ref 15–143)

## 2023-02-28 LAB — KAPPA/LAMBDA LIGHT CHAINS
Kappa free light chain: 13.9 mg/L (ref 3.3–19.4)
Kappa, lambda light chain ratio: 1.21 (ref 0.26–1.65)
Lambda free light chains: 11.5 mg/L (ref 5.7–26.3)

## 2023-03-01 ENCOUNTER — Telehealth: Payer: Self-pay | Admitting: Dietician

## 2023-03-01 NOTE — Telephone Encounter (Signed)
Patient screened on MST. First attempt to reach. Provided my cell# on voice mail to return call to set up a nutrition consult. ° °Cyndi Dinger, RDN, LDN °Registered Dietitian, [facility] °Part Time Remote (Usual office hours: Tuesday-Thursday) °Cell: [phone]   °

## 2023-03-03 ENCOUNTER — Telehealth: Payer: Self-pay | Admitting: Family Medicine

## 2023-03-03 MED ORDER — ONETOUCH VERIO VI STRP: ORAL_STRIP | 3 refills | Status: DC

## 2023-03-03 NOTE — Telephone Encounter (Signed)
Prescription Request  03/03/2023  LOV: 10/11/2022 [facility] in [place] is the correct pharmacy What is the name of the medication or equipment? glucose blood (ONETOUCH VERIO) test strip   Have you contacted your pharmacy to request a refill? Yes   Which pharmacy would you like this sent to?  [id number] Lorenza Evangelist, [place] - [address] AT [facility] [address] [place] [address] Phone: [phone] Fax: [phone]  [facility], [place] - [address] [facility] [address] [place] [address] Phone: [phone] Fax: [phone]    Patient notified that their request is being sent to the clinical staff for review and that they should receive a response within 2 business days.   Please advise at [facility] [phone]

## 2023-03-03 NOTE — Telephone Encounter (Signed)
Refill sent.

## 2023-03-04 LAB — IMMUNOFIXATION REFLEX, SERUM
IgA: 22 mg/dL — ABNORMAL LOW (ref 61–437)
IgG (Immunoglobin G), Serum: 297 mg/dL — ABNORMAL LOW (ref 603–1613)
IgM (Immunoglobulin M), Srm: 7 mg/dL — ABNORMAL LOW (ref 15–143)

## 2023-03-04 LAB — PROTEIN ELECTROPHORESIS, SERUM, WITH REFLEX
A/G Ratio: 1.4 (ref 0.7–1.7)
Albumin ELP: 3.4 g/dL (ref 2.9–4.4)
Alpha-1-Globulin: 0.3 g/dL (ref 0.0–0.4)
Alpha-2-Globulin: 0.9 g/dL (ref 0.4–1.0)
Beta Globulin: 0.9 g/dL (ref 0.7–1.3)
Gamma Globulin: 0.3 g/dL — ABNORMAL LOW (ref 0.4–1.8)
Globulin, Total: 2.4 g/dL (ref 2.2–3.9)
M-Spike, %: 0.1 g/dL — ABNORMAL HIGH
SPEP Interpretation: 0
Total Protein ELP: 5.8 g/dL — ABNORMAL LOW (ref 6.0–8.5)

## 2023-03-10 ENCOUNTER — Telehealth: Payer: Self-pay | Admitting: Dietician

## 2023-03-10 NOTE — Telephone Encounter (Signed)
Patient screened on MST. Second attempt to reach. Provided my cell# on voice mail to return call to set up a nutrition consult.  Gennaro Africa, RDN, LDN Registered Dietitian,  [facility] Part Time Remote (Usual office hours: Tuesday-Thursday) Cell: [phone]

## 2023-03-11 ENCOUNTER — Inpatient Hospital Stay: Payer: 59

## 2023-03-16 ENCOUNTER — Emergency Department (HOSPITAL_COMMUNITY): Payer: 59

## 2023-03-16 ENCOUNTER — Encounter (HOSPITAL_COMMUNITY): Payer: Self-pay | Admitting: Emergency Medicine

## 2023-03-16 ENCOUNTER — Observation Stay (HOSPITAL_COMMUNITY)
Admission: EM | Admit: 2023-03-16 | Discharge: 2023-03-18 | Disposition: A | Discharge status: Home / Self Care | Payer: 59 | Attending: Internal Medicine | Admitting: Internal Medicine

## 2023-03-16 ENCOUNTER — Observation Stay (HOSPITAL_COMMUNITY): Payer: 59

## 2023-03-16 ENCOUNTER — Other Ambulatory Visit: Payer: Self-pay

## 2023-03-16 DIAGNOSIS — Z87891 Personal history of nicotine dependence: Secondary | ICD-10-CM | POA: Insufficient documentation

## 2023-03-16 DIAGNOSIS — H5347 Heteronymous bilateral field defects: Secondary | ICD-10-CM | POA: Insufficient documentation

## 2023-03-16 DIAGNOSIS — R41841 Cognitive communication deficit: Secondary | ICD-10-CM | POA: Diagnosis not present

## 2023-03-16 DIAGNOSIS — H547 Unspecified visual loss: Secondary | ICD-10-CM | POA: Diagnosis not present

## 2023-03-16 DIAGNOSIS — I6523 Occlusion and stenosis of bilateral carotid arteries: Secondary | ICD-10-CM | POA: Diagnosis not present

## 2023-03-16 DIAGNOSIS — N183 Chronic kidney disease, stage 3 unspecified: Secondary | ICD-10-CM | POA: Diagnosis present

## 2023-03-16 DIAGNOSIS — Z794 Long term (current) use of insulin: Secondary | ICD-10-CM | POA: Insufficient documentation

## 2023-03-16 DIAGNOSIS — I4891 Unspecified atrial fibrillation: Secondary | ICD-10-CM | POA: Insufficient documentation

## 2023-03-16 DIAGNOSIS — I4892 Unspecified atrial flutter: Secondary | ICD-10-CM | POA: Diagnosis present

## 2023-03-16 DIAGNOSIS — Z8579 Personal history of other malignant neoplasms of lymphoid, hematopoietic and related tissues: Secondary | ICD-10-CM | POA: Insufficient documentation

## 2023-03-16 DIAGNOSIS — G9389 Other specified disorders of brain: Secondary | ICD-10-CM | POA: Diagnosis not present

## 2023-03-16 DIAGNOSIS — Z91040 Latex allergy status: Secondary | ICD-10-CM | POA: Insufficient documentation

## 2023-03-16 DIAGNOSIS — Z8673 Personal history of transient ischemic attack (TIA), and cerebral infarction without residual deficits: Secondary | ICD-10-CM

## 2023-03-16 DIAGNOSIS — I129 Hypertensive chronic kidney disease with stage 1 through stage 4 chronic kidney disease, or unspecified chronic kidney disease: Secondary | ICD-10-CM | POA: Insufficient documentation

## 2023-03-16 DIAGNOSIS — G450 Vertebro-basilar artery syndrome: Secondary | ICD-10-CM

## 2023-03-16 DIAGNOSIS — I6381 Other cerebral infarction due to occlusion or stenosis of small artery: Secondary | ICD-10-CM | POA: Diagnosis not present

## 2023-03-16 DIAGNOSIS — G319 Degenerative disease of nervous system, unspecified: Secondary | ICD-10-CM | POA: Diagnosis not present

## 2023-03-16 DIAGNOSIS — H5462 Unqualified visual loss, left eye, normal vision right eye: Secondary | ICD-10-CM | POA: Diagnosis present

## 2023-03-16 DIAGNOSIS — N1831 Chronic kidney disease, stage 3a: Secondary | ICD-10-CM

## 2023-03-16 DIAGNOSIS — I779 Disorder of arteries and arterioles, unspecified: Secondary | ICD-10-CM | POA: Diagnosis present

## 2023-03-16 DIAGNOSIS — Z7985 Long-term (current) use of injectable non-insulin antidiabetic drugs: Secondary | ICD-10-CM | POA: Insufficient documentation

## 2023-03-16 DIAGNOSIS — G453 Amaurosis fugax: Principal | ICD-10-CM | POA: Insufficient documentation

## 2023-03-16 DIAGNOSIS — E1122 Type 2 diabetes mellitus with diabetic chronic kidney disease: Secondary | ICD-10-CM | POA: Insufficient documentation

## 2023-03-16 DIAGNOSIS — E785 Hyperlipidemia, unspecified: Secondary | ICD-10-CM | POA: Diagnosis present

## 2023-03-16 DIAGNOSIS — Z951 Presence of aortocoronary bypass graft: Secondary | ICD-10-CM

## 2023-03-16 DIAGNOSIS — I480 Paroxysmal atrial fibrillation: Secondary | ICD-10-CM | POA: Diagnosis not present

## 2023-03-16 DIAGNOSIS — C9000 Multiple myeloma not having achieved remission: Secondary | ICD-10-CM | POA: Diagnosis not present

## 2023-03-16 DIAGNOSIS — I10 Essential (primary) hypertension: Secondary | ICD-10-CM | POA: Diagnosis not present

## 2023-03-16 DIAGNOSIS — E782 Mixed hyperlipidemia: Secondary | ICD-10-CM

## 2023-03-16 DIAGNOSIS — Z955 Presence of coronary angioplasty implant and graft: Secondary | ICD-10-CM | POA: Diagnosis not present

## 2023-03-16 DIAGNOSIS — G4733 Obstructive sleep apnea (adult) (pediatric): Secondary | ICD-10-CM | POA: Diagnosis not present

## 2023-03-16 DIAGNOSIS — R079 Chest pain, unspecified: Secondary | ICD-10-CM | POA: Diagnosis not present

## 2023-03-16 DIAGNOSIS — E118 Type 2 diabetes mellitus with unspecified complications: Secondary | ICD-10-CM | POA: Diagnosis not present

## 2023-03-16 DIAGNOSIS — Z7901 Long term (current) use of anticoagulants: Secondary | ICD-10-CM | POA: Diagnosis not present

## 2023-03-16 DIAGNOSIS — R0789 Other chest pain: Secondary | ICD-10-CM | POA: Diagnosis not present

## 2023-03-16 DIAGNOSIS — I6503 Occlusion and stenosis of bilateral vertebral arteries: Secondary | ICD-10-CM | POA: Diagnosis not present

## 2023-03-16 DIAGNOSIS — Z79899 Other long term (current) drug therapy: Secondary | ICD-10-CM | POA: Diagnosis not present

## 2023-03-16 DIAGNOSIS — I2510 Atherosclerotic heart disease of native coronary artery without angina pectoris: Secondary | ICD-10-CM | POA: Diagnosis not present

## 2023-03-16 DIAGNOSIS — I700 Atherosclerosis of aorta: Secondary | ICD-10-CM | POA: Diagnosis not present

## 2023-03-16 DIAGNOSIS — I739 Peripheral vascular disease, unspecified: Secondary | ICD-10-CM | POA: Diagnosis present

## 2023-03-16 DIAGNOSIS — N1832 Chronic kidney disease, stage 3b: Secondary | ICD-10-CM | POA: Diagnosis not present

## 2023-03-16 DIAGNOSIS — I672 Cerebral atherosclerosis: Secondary | ICD-10-CM | POA: Diagnosis not present

## 2023-03-16 LAB — GLUCOSE, CAPILLARY: Glucose-Capillary: 95 mg/dL (ref 70–99)

## 2023-03-16 LAB — BASIC METABOLIC PANEL
Anion gap: 4 — ABNORMAL LOW (ref 5–15)
BUN: 21 mg/dL (ref 8–23)
CO2: 30 mmol/L (ref 22–32)
Calcium: 9.2 mg/dL (ref 8.9–10.3)
Chloride: 105 mmol/L (ref 98–111)
Creatinine, Ser: 1.54 mg/dL — ABNORMAL HIGH (ref 0.61–1.24)
GFR, Estimated: 46 mL/min — ABNORMAL LOW (ref >60)
Glucose, Bld: 202 mg/dL — ABNORMAL HIGH (ref 70–99)
Potassium: 4.5 mmol/L (ref 3.5–5.1)
Sodium: 139 mmol/L (ref 135–145)

## 2023-03-16 LAB — CBC
HCT: 35.3 % — ABNORMAL LOW (ref 39.0–52.0)
Hemoglobin: 12.1 g/dL — ABNORMAL LOW (ref 13.0–17.0)
MCH: 32.2 pg (ref 26.0–34.0)
MCHC: 34.3 g/dL (ref 30.0–36.0)
MCV: 93.9 fL (ref 80.0–100.0)
Platelets: 149 10*3/uL — ABNORMAL LOW (ref 150–400)
RBC: 3.76 MIL/uL — ABNORMAL LOW (ref 4.22–5.81)
RDW: 12.9 % (ref 11.5–15.5)
WBC: 5 10*3/uL (ref 4.0–10.5)
nRBC: 0 % (ref 0.0–0.2)

## 2023-03-16 LAB — TROPONIN I (HIGH SENSITIVITY)
Troponin I (High Sensitivity): 10 ng/L (ref <18)
Troponin I (High Sensitivity): 10 ng/L (ref <18)

## 2023-03-16 LAB — PROTIME-INR
INR: 1.2 (ref 0.8–1.2)
Prothrombin Time: 15.6 seconds — ABNORMAL HIGH (ref 11.4–15.2)

## 2023-03-16 LAB — APTT: aPTT: 34 seconds (ref 24–36)

## 2023-03-16 MED ORDER — IOHEXOL 350 MG/ML SOLN
75.0000 mL | Freq: Once | INTRAVENOUS | Status: AC | PRN
  Administered 2023-03-16: 75 mL via INTRAVENOUS

## 2023-03-16 MED ORDER — EZETIMIBE 10 MG PO TABS
10.0000 mg | ORAL_TABLET | Freq: Every day | ORAL | Status: DC
  Administered 2023-03-17 – 2023-03-18 (×2): 10 mg via ORAL
  Filled 2023-03-16 (×2): qty 1

## 2023-03-16 MED ORDER — DULOXETINE HCL 30 MG PO CPEP
30.0000 mg | ORAL_CAPSULE | Freq: Every day | ORAL | Status: DC
  Administered 2023-03-17: 30 mg via ORAL
  Filled 2023-03-16 (×2): qty 1

## 2023-03-16 MED ORDER — SENNOSIDES-DOCUSATE SODIUM 8.6-50 MG PO TABS: 1.0000 | ORAL_TABLET | Freq: Every evening | ORAL | Status: DC | PRN

## 2023-03-16 MED ORDER — APIXABAN 5 MG PO TABS
5.0000 mg | ORAL_TABLET | Freq: Two times a day (BID) | ORAL | Status: DC
  Administered 2023-03-16 – 2023-03-18 (×4): 5 mg via ORAL
  Filled 2023-03-16 (×4): qty 1

## 2023-03-16 MED ORDER — ACETAMINOPHEN 160 MG/5ML PO SOLN: 650.0000 mg | ORAL | Status: DC | PRN

## 2023-03-16 MED ORDER — INSULIN ASPART 100 UNIT/ML IJ SOLN
0.0000 [IU] | Freq: Three times a day (TID) | INTRAMUSCULAR | Status: DC
  Administered 2023-03-17: 5 [IU] via SUBCUTANEOUS
  Administered 2023-03-17: 3 [IU] via SUBCUTANEOUS
  Administered 2023-03-18: 5 [IU] via SUBCUTANEOUS

## 2023-03-16 MED ORDER — METOPROLOL TARTRATE 12.5 MG HALF TABLET
12.5000 mg | ORAL_TABLET | Freq: Two times a day (BID) | ORAL | Status: DC
  Administered 2023-03-16 – 2023-03-17 (×3): 12.5 mg via ORAL
  Filled 2023-03-16 (×4): qty 1

## 2023-03-16 MED ORDER — ROSUVASTATIN CALCIUM 20 MG PO TABS
20.0000 mg | ORAL_TABLET | Freq: Every day | ORAL | Status: DC
  Administered 2023-03-16 – 2023-03-17 (×2): 20 mg via ORAL
  Filled 2023-03-16 (×2): qty 1

## 2023-03-16 MED ORDER — ACETAMINOPHEN 650 MG RE SUPP: 650.0000 mg | RECTAL | Status: DC | PRN

## 2023-03-16 MED ORDER — GABAPENTIN 300 MG PO CAPS
300.0000 mg | ORAL_CAPSULE | Freq: Three times a day (TID) | ORAL | Status: DC
  Administered 2023-03-16 – 2023-03-18 (×5): 300 mg via ORAL
  Filled 2023-03-16 (×5): qty 1

## 2023-03-16 MED ORDER — STROKE: EARLY STAGES OF RECOVERY BOOK
Freq: Once | Status: AC
  Filled 2023-03-16: qty 1

## 2023-03-16 MED ORDER — ACETAMINOPHEN 325 MG PO TABS
650.0000 mg | ORAL_TABLET | ORAL | Status: DC | PRN
  Administered 2023-03-16 – 2023-03-17 (×2): 650 mg via ORAL
  Filled 2023-03-16 (×2): qty 2

## 2023-03-16 MED ORDER — INSULIN GLARGINE-YFGN 100 UNIT/ML ~~LOC~~ SOLN
15.0000 [IU] | Freq: Two times a day (BID) | SUBCUTANEOUS | Status: DC
  Administered 2023-03-17 – 2023-03-18 (×3): 15 [IU] via SUBCUTANEOUS
  Filled 2023-03-16 (×5): qty 0.15

## 2023-03-16 NOTE — ED Notes (Signed)
ED TO INPATIENT HANDOFF REPORT  ED Nurse Name and Phone #: Einar Grad [phone]  S Name/Age/Gender [name] 77 y.o. male Room/Bed: 018C/018C  Code Status   Code Status: Full Code  Home/SNF/Other Home Patient oriented to: self, place, time, and situation Is this baseline? Yes   Triage Complete: Triage complete  Chief Complaint Amaurosis fugax of left eye [G45.3]  Triage Note Pt here from home with c/o chest pain radiating across his chest and back , some sharp pain in his neck also    Allergies Allergies  Allergen Reactions   Codeine Nausea And Vomiting   Lisinopril Cough   Nsaids Other (See Comments)    CKD   Dapagliflozin Rash   Latex Hives    Level of Care/Admitting Diagnosis ED Disposition     ED Disposition  Admit   Condition  --   Comment  Hospital Area: [facility] [[id number]]  Level of Care: Telemetry Medical [104]  May place patient in observation at [facility] or [facility] if equivalent level of care is available:: No  Covid Evaluation: Asymptomatic - no recent exposure (last 10 days) testing not required  Diagnosis: Amaurosis fugax of left eye [782956]  Admitting Physician: Synetta Fail [[id number]]  Attending Physician: Synetta Fail [[id number]]          B Medical/Surgery History Past Medical History:  Diagnosis Date   Acute kidney injury superimposed on chronic kidney disease (HCC) 10/12/2017   Atrial fibrillation with RVR (HCC) 10/27/2017   Back pain    Basilar artery stenosis    on chronic Plavix   Benign prostatic hypertrophy without urinary obstruction 04/17/2014   Carotid artery disease (HCC) 10/06/2010   Carotid US 04/2019: Bilat ICA 40-59; L subclavian stenosis  // Carotid US 11/21: Bilat ICA 40-59; bilateral subclavian stenosis // Carotid US 11/22: Bilateral ICA 40-59; left subclavian stenosis // Carotid US 04/28/2022: Bilateral ICA 40-59; left subclavian stenosis   Cerebral vascular disease    with  prior TIA's; followed by Dr. Sandria Manly   Depression, neurotic 11/21/2013   Diabetes mellitus    on insulin   Enthesopathy of ankle and tarsus 09/04/2007   Overview:  Metatarsalgia    Enthesopathy of ankle and tarsus 09/04/2007   Overview:  Metatarsalgia  Formatting of this note might be different from the original. Metatarsalgia  10/1 IMO update   History of renal calculi    Hyperlipidemia    Hypertension    Ischemic heart disease    prior PCI to RCA in 1989. S/P PCI to LAD and OM in 1992. S/P PCI to first DX in 2000. S/P CABG x 3 in May 2011   Lambda light chain myeloma (HCC) 10/28/2021   Left hip pain 01/03/2020   OSA (obstructive sleep apnea) 01/11/2018   AHI 18.1 and SaO2 low 73%   PAD (peripheral artery disease) (HCC) 11/18/2017   ABIs/Arterial US 04/2019: R 1.21; L 0.66 // R SFA 30-49, stable > 50 CIA and EIA stenosis; L > 50 CIA stenosis (likely represents severe stenosis or short segment occlusion)   Peripheral neuropathy    Pneumonia 02/2011   Sacroiliac joint dysfunction of left side 01/03/2020   Stroke (HCC) 04/16/2001   small right cerebellar infarct on 04/16/2001 at that time he was found to have proximal left vertebral artery, proximal left common carotid artery and both external carotid artery stenosis as well as intracranial stenosis involving mid basilar artery- 07/2011 add questionable TIA   Past Surgical History:  Procedure Laterality Date    NASAL ENDOSCOPY  01/11/2020   chronic rhinitis w/o evidence of acute sinusitis, bilateral inferior turbinate hypertrophy   ABDOMINAL AORTOGRAM W/LOWER EXTREMITY Bilateral 05/30/2019   Procedure: ABDOMINAL AORTOGRAM W/LOWER EXTREMITY;  Surgeon: Iran Ouch, MD;  Location: [facility] INVASIVE CV LAB;  Service: Cardiovascular;  Laterality: Bilateral;   ANGIOPLASTY  1989   right coronary artery   ANGIOPLASTY  1992   LAD and OM   ANGIOPLASTY  1998   First DX   BRAIN SURGERY     on prior records   CORONARY ARTERY BYPASS GRAFT   11/12/2009   LIMA to LAD, SVG to OM and SVG to RCA   CORONARY STENT PLACEMENT  2000   Stent to LAD/Circumflex with angioplasty to first diagonal    LEFT HEART CATH AND CORS/GRAFTS ANGIOGRAPHY N/A 10/13/2017   Procedure: LEFT HEART CATH AND CORS/GRAFTS ANGIOGRAPHY;  Surgeon: Yvonne Kendall, MD;  Location: [facility] INVASIVE CV LAB;  Service: Cardiovascular;  Laterality: N/A;     A IV Location/Drains/Wounds Patient Lines/Drains/Airways Status     Active Line/Drains/Airways     Name Placement date Placement time Site Days   Peripheral IV 03/16/23 20 G Left Antecubital 03/16/23  1340  Antecubital  less than 1            Intake/Output Last 24 hours No intake or output data in the 24 hours ending 03/16/23 1738  Labs/Imaging Results for orders placed or performed during the hospital encounter of 03/16/23 (from the past 48 hour(s))  Basic metabolic panel     Status: Abnormal   Collection Time: 03/16/23  1:02 PM  Result Value Ref Range   Sodium 139 135 - 145 mmol/L   Potassium 4.5 3.5 - 5.1 mmol/L   Chloride 105 98 - 111 mmol/L   CO2 30 22 - 32 mmol/L   Glucose, Bld 202 (H) 70 - 99 mg/dL    Comment: Glucose reference range applies only to samples taken after fasting for at least 8 hours.   BUN 21 8 - 23 mg/dL   Creatinine, Ser 1.61 (H) 0.61 - 1.24 mg/dL   Calcium 9.2 8.9 - 09.6 mg/dL   GFR, Estimated 46 (L) >60 mL/min    Comment: (NOTE) Calculated using the CKD-EPI Creatinine Equation (2021)    Anion gap 4 (L) 5 - 15    Comment: Performed at [facility], [address], [place], [place] [address]  CBC     Status: Abnormal   Collection Time: 03/16/23  1:02 PM  Result Value Ref Range   WBC 5.0 4.0 - 10.5 K/uL   RBC 3.76 (L) 4.22 - 5.81 MIL/uL   Hemoglobin 12.1 (L) 13.0 - 17.0 g/dL   HCT 98.1 (L) 19.1 - 47.8 %   MCV 93.9 80.0 - 100.0 fL   MCH 32.2 26.0 - 34.0 pg   MCHC 34.3 30.0 - 36.0 g/dL   RDW 29.5 62.1 - 30.8 %   Platelets 149 (L) 150 - 400 K/uL   nRBC 0.0 0.0 - 0.2  %    Comment: Performed at [facility], [address] [place], [place] [address]  Troponin I (High Sensitivity)     Status: None   Collection Time: 03/16/23  1:02 PM  Result Value Ref Range   Troponin I (High Sensitivity) 10 <18 ng/L    Comment: (NOTE) Elevated high sensitivity troponin I (hsTnI) values and significant  changes across serial measurements may suggest ACS but many other  chronic and acute conditions are known to elevate hsTnI results.  Refer to the "Links" section for chest pain algorithms and additional  guidance. Performed at [facility], [address], [place], [place] [address]   APTT     Status: None   Collection Time: 03/16/23  1:27 PM  Result Value Ref Range   aPTT 34 24 - 36 seconds    Comment: Performed at [facility], [address] [place], [place] [address]  Protime-INR     Status: Abnormal   Collection Time: 03/16/23  1:27 PM  Result Value Ref Range   Prothrombin Time 15.6 (H) 11.4 - 15.2 seconds   INR 1.2 0.8 - 1.2    Comment: (NOTE) INR goal varies based on device and disease states. Performed at [facility], [address] [place], [place] [address]   Troponin I (High Sensitivity)     Status: None   Collection Time: 03/16/23  2:59 PM  Result Value Ref Range   Troponin I (High Sensitivity) 10 <18 ng/L    Comment: (NOTE) Elevated high sensitivity troponin I (hsTnI) values and significant  changes across serial measurements may suggest ACS but many other  chronic and acute conditions are known to elevate hsTnI results.  Refer to the "Links" section for chest pain algorithms and additional  guidance. Performed at [facility], [address], [place], [place] [address]    CT ANGIO HEAD NECK W WO CM  Result Date: 03/16/2023 CLINICAL DATA:  Neuro deficit, acute, stroke suspected. Transient bilateral vision loss. EXAM: CT ANGIOGRAPHY HEAD AND NECK WITH AND WITHOUT CONTRAST TECHNIQUE: Multidetector CT imaging  of the head and neck was performed using the standard protocol during bolus administration of intravenous contrast. Multiplanar CT image reconstructions and MIPs were obtained to evaluate the vascular anatomy. Carotid stenosis measurements (when applicable) are obtained utilizing NASCET criteria, using the distal internal carotid diameter as the denominator. RADIATION DOSE REDUCTION: This exam was performed according to the departmental dose-optimization program which includes automated exposure control, adjustment of the mA and/or kV according to patient size and/or use of iterative reconstruction technique. CONTRAST:  75mL OMNIPAQUE IOHEXOL 350 MG/ML SOLN COMPARISON:  MRI of the brain September 18, 2021. FINDINGS: CT HEAD FINDINGS Brain: No evidence of acute infarction, hemorrhage, hydrocephalus, extra-axial collection or mass lesion/mass effect. Chronic infarct in the right parietooccipital region. Small remote infarcts are also seen in the left cerebellar hemisphere. Vascular: Calcified plaques in the bilateral carotid siphons. Skull: Normal. Negative for fracture or focal lesion. Sinuses/Orbits: No acute finding. Other: None. Review of the MIP images confirms the above findings CTA NECK FINDINGS Aortic arch: Standard branching. Imaged portion shows no evidence of aneurysm or dissection. Calcified plaques are seen in the aortic arch extending to the origin of the major neck arteries. No significant stenosis at the innominate and left subclavian artery origins. Left common carotid artery origin is obscured by dense contrast in the left brachiocephalic vein. Right carotid system: Atherosclerotic changes with mixed density plaque in the proximal right common carotid artery without significant stenosis. Atherosclerotic changes of the right carotid bifurcation with mixed density plaques resulting in approximately 50% stenosis. Left carotid system: The origin and proximal segments of the left common carotid arteries are  obscured by dense contrast on adjacent vein. Mild atherosclerotic changes along the left common carotid artery without hemodynamically significant stenosis. Atherosclerotic changes of the left carotid bifurcation resulting approximately 60% stenosis. Vertebral arteries: Atherosclerotic changes at  the origin of the right vertebral artery with severe stenosis. There is also moderate stenosis of the right vertebral artery at the V3-V4 junction. Atherosclerotic changes are also seen at the origin of the dominant left vertebral artery resulting in severe stenosis. There is also at least moderate stenosis of the left vertebral artery at the C5-6 level secondary to spondylosis. Skeleton: Degenerative changes of the cervical spine. Postsurgical changes from prior sternotomy. Other neck: Negative. Upper chest: Negative. Review of the MIP images confirms the above findings CTA HEAD FINDINGS Anterior circulation: Calcified plaques in the bilateral carotid siphons with moderate stenosis at the paraclinoid segment on the right. The bilateral MCA and ACA vascular trees are maintained without high-grade stenosis, aneurysm or other significant vascular abnormality. Posterior circulation: Atherosclerotic changes of the intracranial right vertebral artery with severe stenosis at the vertebrobasilar junction. Atherosclerotic changes of the intracranial left vertebral artery with mild stenosis. Severe atherosclerotic changes of the basilar artery with severe mid/proximal stenosis. Mild stenosis at the origin of the right posterior cerebral artery. The left posterior cerebral artery originates from the left ICA and is maintained. Venous sinuses: As permitted by contrast timing, patent. Anatomic variants: Fetal left PCA. Review of the MIP images confirms the above findings IMPRESSION: 1. No acute intracranial abnormality. 2. Chronic infarct in the right parietooccipital region. Small remote infarcts in the left cerebellar hemisphere. 3.  No intracranial large vessel occlusion. 4. Atherosclerotic changes of the basilar artery with severe mid/proximal stenosis. 5. Atherosclerotic changes of the bilateral carotid siphons with moderate stenosis at the paraclinoid segment on the right. 6. Atherosclerotic changes of the bilateral vertebral arteries with severe stenosis their origin. 7. Atherosclerotic changes of the left carotid bifurcation resulting in approximately 60% stenosis. 8. Atherosclerotic changes of the right carotid bifurcation resulting approximately 50% stenosis. 9. Aortic atherosclerosis. Aortic Atherosclerosis (ICD10-I70.0). Electronically Signed   By: Baldemar Lenis M.D.   On: 03/16/2023 15:35   DG Chest 2 View  Result Date: 03/16/2023 CLINICAL DATA:  Chest pain. EXAM: CHEST - 2 VIEW COMPARISON:  08/12/2022. FINDINGS: Bilateral lung fields are clear. Bilateral costophrenic angles are clear. Normal cardio-mediastinal silhouette. Sternotomy wires noted, status post CABG. Aortic arch calcifications seen. No acute osseous abnormalities. The soft tissues are within normal limits. IMPRESSION: No acute cardiopulmonary process. Electronically Signed   By: Jules Schick M.D.   On: 03/16/2023 15:18    Pending Labs Unresulted Labs (From admission, onward)     Start     Ordered   03/17/23 0500  Lipid panel  (Labs)  Tomorrow morning,   R       Comments: Fasting    03/16/23 1723   03/17/23 0500  Hemoglobin A1c  (Labs)  Tomorrow morning,   R       Comments: To assess prior glycemic control    03/16/23 1723   03/17/23 0500  CBC  Tomorrow morning,   R        03/16/23 1723   03/17/23 0500  Comprehensive metabolic panel  Tomorrow morning,   R        03/16/23 1723            Vitals/Pain Today's Vitals   03/16/23 1445 03/16/23 1600 03/16/23 1630 03/16/23 1730  BP: (!) 156/73 (!) 143/82 (!) 161/92 (!) 184/75  Pulse: 65 (!) 57 (!) 48 (!) 58  Resp: 17 (!) 23 20 19   Temp:      TempSrc:      SpO2: 98% 98% 99%  99%  Weight:      Height:      PainSc:        Isolation Precautions No active isolations  Medications Medications  ezetimibe (ZETIA) tablet 10 mg (has no administration in time range)  metoprolol tartrate (LOPRESSOR) tablet 12.5 mg (has no administration in time range)  rosuvastatin (CRESTOR) tablet 20 mg (has no administration in time range)  DULoxetine (CYMBALTA) DR capsule 30 mg (has no administration in time range)  apixaban (ELIQUIS) tablet 5 mg (has no administration in time range)  gabapentin (NEURONTIN) capsule 300 mg (has no administration in time range)   stroke: early stages of recovery book (has no administration in time range)  acetaminophen (TYLENOL) tablet 650 mg (has no administration in time range)    Or  acetaminophen (TYLENOL) 160 MG/5ML solution 650 mg (has no administration in time range)    Or  acetaminophen (TYLENOL) suppository 650 mg (has no administration in time range)  senna-docusate (Senokot-S) tablet 1 tablet (has no administration in time range)  iohexol (OMNIPAQUE) 350 MG/ML injection 75 mL (75 mLs Intravenous Contrast Given 03/16/23 1425)    Mobility walks with device     Focused Assessments Neuro Assessment Handoff:  Neuro Assessment: Within Defined Limits Neuro Checks:       R Recommendations: See Admitting Provider Note  Report given to:   Additional Notes:  Patient presented for concern for multiple TIAs after having three episodes of approximately 30 seconds of total vision loss since last night. Patient is AAOx4.

## 2023-03-16 NOTE — ED Notes (Signed)
Patient transported to the floor by patient transport via stretcher, portable tele monitor in use

## 2023-03-16 NOTE — Consult Note (Signed)
Neurology Consultation  Reason for Consult: 3 episodes of vision loss Referring Physician: Dr. Alinda Money   CC: chest pain  History is obtained from:patient and medical record   HPI: Marvin Salinas is a 77 y.o. male with past medical history of A fib, CAD, prior strokes, DM, HTN, HLD, myeloma, OSA, PAD post stents, depression, neuropathy in his feet and legs to mid-calf and bilateral hands who presents today for 3 separate episodes of left eye vision loss and chest pain.  He states today he had 3 similar episodes of left eye vision loss associated with dizziness all 3 events lasting about 20 seconds. He states for the last 17 months ago he lost his left peripheral vision. Today he states the 1st episode he lost complete vision in his left eye and the other 2 times just a portion of his vision was loss. Vision has resolved back to baseline. He denies any more episodes of vision loss of dizziness. Vision loss was not associated with pain or headache. Denies any weakness, or new numbness /tingling or slurred speech.  He states that he is having bad chest pain like "nerve pain".  Neurology consulted   LKW: 2030 on 9/24 IV thrombolysis given?: no, outside window EVT:  No LVO Premorbid modified Rankin scale (mRS):  0-Completely asymptomatic and back to baseline post-stroke  ROS: Full ROS was performed and is negative except as noted in the HPI.   Past Medical History:  Diagnosis Date   Acute kidney injury superimposed on chronic kidney disease (HCC) 10/12/2017   Atrial fibrillation with RVR (HCC) 10/27/2017   Back pain    Basilar artery stenosis    on chronic Plavix   Benign prostatic hypertrophy without urinary obstruction 04/17/2014   Carotid artery disease (HCC) 10/06/2010   Carotid US 04/2019: Bilat ICA 40-59; L subclavian stenosis  // Carotid US 11/21: Bilat ICA 40-59; bilateral subclavian stenosis // Carotid US 11/22: Bilateral ICA 40-59; left subclavian stenosis // Carotid US 04/28/2022:  Bilateral ICA 40-59; left subclavian stenosis   Cerebral vascular disease    with prior TIA's; followed by Dr. Sandria Manly   Depression, neurotic 11/21/2013   Diabetes mellitus    on insulin   Enthesopathy of ankle and tarsus 09/04/2007   Overview:  Metatarsalgia    Enthesopathy of ankle and tarsus 09/04/2007   Overview:  Metatarsalgia  Formatting of this note might be different from the original. Metatarsalgia  10/1 IMO update   History of renal calculi    Hyperlipidemia    Hypertension    Ischemic heart disease    prior PCI to RCA in 1989. S/P PCI to LAD and OM in 1992. S/P PCI to first DX in 2000. S/P CABG x 3 in May 2011   Lambda light chain myeloma (HCC) 10/28/2021   Left hip pain 01/03/2020   OSA (obstructive sleep apnea) 01/11/2018   AHI 18.1 and SaO2 low 73%   PAD (peripheral artery disease) (HCC) 11/18/2017   ABIs/Arterial US 04/2019: R 1.21; L 0.66 // R SFA 30-49, stable > 50 CIA and EIA stenosis; L > 50 CIA stenosis (likely represents severe stenosis or short segment occlusion)   Peripheral neuropathy    Pneumonia 02/2011   Sacroiliac joint dysfunction of left side 01/03/2020   Stroke (HCC) 04/16/2001   small right cerebellar infarct on 04/16/2001 at that time he was found to have proximal left vertebral artery, proximal left common carotid artery and both external carotid artery stenosis as well as intracranial stenosis involving  mid basilar artery- 07/2011 add questionable TIA     Family History  Problem Relation Age of Onset   Depression Mother    Early death Mother    Kidney disease Mother    Ovarian cancer Mother    Hypertension Father    Heart disease Father    Heart attack Father    Asthma Brother    Diabetes Brother    Stroke Other        Uncle   Diabetes Sister    Colon cancer Neg Hx    Rectal cancer Neg Hx    Stomach cancer Neg Hx    Esophageal cancer Neg Hx      Social History:   reports that he quit smoking about 8 months ago. His smoking use included  cigarettes. He started smoking about 54 years ago. He has a 13.5 pack-year smoking history. He has never used smokeless tobacco. He reports that he does not drink alcohol and does not use drugs.  Medications  Current Facility-Administered Medications:    [START ON 03/17/2023]  stroke: early stages of recovery book, , Does not apply, Once, Synetta Fail, MD   acetaminophen (TYLENOL) tablet 650 mg, 650 mg, Oral, Q4H PRN **OR** acetaminophen (TYLENOL) 160 MG/5ML solution 650 mg, 650 mg, Per Tube, Q4H PRN **OR** acetaminophen (TYLENOL) suppository 650 mg, 650 mg, Rectal, Q4H PRN, Synetta Fail, MD   apixaban Everlene Balls) tablet 5 mg, 5 mg, Oral, BID, Synetta Fail, MD   [START ON 03/17/2023] DULoxetine (CYMBALTA) DR capsule 30 mg, 30 mg, Oral, Daily, Synetta Fail, MD   [START ON 03/17/2023] ezetimibe (ZETIA) tablet 10 mg, 10 mg, Oral, Daily, Synetta Fail, MD   gabapentin (NEURONTIN) capsule 300 mg, 300 mg, Oral, TID, Synetta Fail, MD   metoprolol tartrate (LOPRESSOR) tablet 12.5 mg, 12.5 mg, Oral, BID, Synetta Fail, MD   rosuvastatin (CRESTOR) tablet 20 mg, 20 mg, Oral, QHS, Synetta Fail, MD   senna-docusate (Senokot-S) tablet 1 tablet, 1 tablet, Oral, QHS PRN, Synetta Fail, MD  Current Outpatient Medications:    acetaminophen (TYLENOL) 500 MG tablet, Take 1,000 mg by mouth every 6 (six) hours as needed., Disp: , Rfl:    apixaban (ELIQUIS) 5 MG TABS tablet, TAKE 1 TABLET BY MOUTH TWICE DAILY, Disp: 60 tablet, Rfl: 5   Carboxymethylcell-Hypromellose (GENTEAL OP), Place 1 drop into both eyes daily as needed (dry eyes)., Disp: , Rfl:    dofetilide (TIKOSYN) 125 MCG capsule, Take 1 capsule (125 mcg total) by mouth 2 (two) times daily., Disp: 180 capsule, Rfl: 2   DULoxetine (CYMBALTA) 30 MG capsule, Take 1 capsule (30 mg total) by mouth daily., Disp: 30 capsule, Rfl: 3   ezetimibe (ZETIA) 10 MG tablet, Take 1 tablet (10 mg total) by mouth daily.,  Disp: 90 tablet, Rfl: 3   famciclovir (FAMVIR) 250 MG tablet, Take 1 tablet (250 mg total) by mouth daily., Disp: 30 tablet, Rfl: 12   gabapentin (NEURONTIN) 300 MG capsule, Take 1 capsule (300 mg total) by mouth 3 (three) times daily., Disp: 90 capsule, Rfl: 3   glucose blood (ONETOUCH VERIO) test strip, USE UP TO FOUR TIMES DAILY AS DIRECTED., Disp: 100 strip, Rfl: 3   insulin glargine (LANTUS) 100 UNIT/ML Solostar Pen, Inject 40 Units into the skin 2 (two) times daily., Disp: 90 mL, Rfl: 1   Insulin Pen Needle 31G X 4 MM MISC, Use BID with lantus, Disp: 100 each, Rfl: 11   Lancets (ONETOUCH  DELICA PLUS LANCET33G) MISC, USE UP TO FOUR TIMES DAILY AS DIRECTED, Disp: 100 each, Rfl: 11   loratadine (CLARITIN) 10 MG tablet, Take 10 mg by mouth daily as needed for allergies., Disp: , Rfl:    metoprolol tartrate (LOPRESSOR) 25 MG tablet, Take 0.5 tablets (12.5 mg total) by mouth 2 (two) times daily., Disp: 90 tablet, Rfl: 3   nitroGLYCERIN (NITROSTAT) 0.4 MG SL tablet, Place 0.4 mg under the tongue every 5 (five) minutes as needed for chest pain. (Patient not taking: Reported on 12/28/2022), Disp: , Rfl:    olmesartan (BENICAR) 5 MG tablet, Take 5 mg by mouth daily., Disp: , Rfl:    ondansetron (ZOFRAN) 8 MG tablet, Take 1 tablet (8 mg total) by mouth every 8 (eight) hours as needed for nausea or vomiting., Disp: 30 tablet, Rfl: 3   polyethylene glycol powder (GLYCOLAX/MIRALAX) 17 GM/SCOOP powder, Take by mouth every other day., Disp: , Rfl:    rosuvastatin (CRESTOR) 20 MG tablet, Take 1 tablet (20 mg total) by mouth at bedtime., Disp: 90 tablet, Rfl: 3   Semaglutide (RYBELSUS) 14 MG TABS, Take 1 tablet (14 mg total) by mouth daily., Disp: 90 tablet, Rfl: 1   Exam: Current vital signs: BP (!) 143/82   Pulse (!) 57   Temp 98.2 F (36.8 C)   Resp (!) 23   Ht 5\' 8"  (1.727 m)   Wt 70.8 kg   SpO2 98%   BMI 23.72 kg/m  Vital signs in last 24 hours: Temp:  [98 F (36.7 C)-98.2 F (36.8 C)] 98.2 F  (36.8 C) (09/25 1439) Pulse Rate:  [57-65] 57 (09/25 1600) Resp:  [17-23] 23 (09/25 1600) BP: (123-156)/(67-83) 143/82 (09/25 1600) SpO2:  [96 %-98 %] 98 % (09/25 1600) Weight:  [70.8 kg] 70.8 kg (09/25 1352)  GENERAL: Awake, alert in NAD HEENT: - Normocephalic and atraumatic, dry mm LUNGS - Clear to auscultation bilaterally with no wheezes CV - S1S2 RRR, no m/r/g, equal pulses bilaterally. ABDOMEN - Soft, nontender, nondistended with normoactive BS Ext: warm, well perfused, intact peripheral pulses, no edema  NEURO:  Mental Status: AA&Ox4 Language: speech isclear.  Naming, repetition, fluency, and comprehension intact. Cranial Nerves: PERRL EOMI, visual fields with left hemianopia, no facial asymmetry, facial sensation intact, hearing intact, tongue/uvula/soft palate midline, normal sternocleidomastoid and trapezius muscle strength. No evidence of tongue atrophy or fibrillations Motor: 5/5 in all 4 extremities Tone: is normal and bulk is normal Sensation- Intact to light touch bilaterally Coordination: FTN intact bilaterally, no ataxia in BLE. Gait- deferred  NIHSS 1a Level of Conscious.: 0 1b LOC Questions: 0 1c LOC Commands: 0 2 Best Gaze: 0 3 Visual: 2 4 Facial Palsy: 0 5a Motor Arm - left: 0 5b Motor Arm - Right: 0 6a Motor Leg - Left: 0 6b Motor Leg - Right: 0 7 Limb Ataxia: 0 8 Sensory: 0 9 Best Language: 0 10 Dysarthria: 0 11 Extinct. and Inatten.: 0 TOTAL: 2   Labs I have reviewed labs in epic and the results pertinent to this consultation are:  CBC    Component Value Date/Time   WBC 5.0 03/16/2023 1302   RBC 3.76 (L) 03/16/2023 1302   HGB 12.1 (L) 03/16/2023 1302   HGB 12.0 (L) 02/25/2023 0921   HGB 13.4 05/22/2019 0912   HCT 35.3 (L) 03/16/2023 1302   HCT 40.2 05/22/2019 0912   PLT 149 (L) 03/16/2023 1302   PLT 156 02/25/2023 0921   PLT 307 05/22/2019 0912   MCV  93.9 03/16/2023 1302   MCV 91 05/22/2019 0912   MCH 32.2 03/16/2023 1302   MCHC  34.3 03/16/2023 1302   RDW 12.9 03/16/2023 1302   RDW 13.3 05/22/2019 0912   LYMPHSABS 1.1 02/25/2023 0921   MONOABS 0.6 02/25/2023 0921   EOSABS 0.1 02/25/2023 0921   BASOSABS 0.0 02/25/2023 0921    CMP     Component Value Date/Time   NA 139 03/16/2023 1302   NA 140 08/31/2021 1026   K 4.5 03/16/2023 1302   CL 105 03/16/2023 1302   CO2 30 03/16/2023 1302   GLUCOSE 202 (H) 03/16/2023 1302   BUN 21 03/16/2023 1302   BUN 21 08/31/2021 1026   CREATININE 1.54 (H) 03/16/2023 1302   CREATININE 1.66 (H) 02/25/2023 0921   CALCIUM 9.2 03/16/2023 1302   PROT 6.1 (L) 02/25/2023 0921   PROT 6.0 10/01/2019 0944   ALBUMIN 4.3 02/25/2023 0921   ALBUMIN 4.2 10/01/2019 0944   AST 23 02/25/2023 0921   ALT 26 02/25/2023 0921   ALKPHOS 43 02/25/2023 0921   BILITOT 0.6 02/25/2023 0921   GFRNONAA 46 (L) 03/16/2023 1302   GFRNONAA 42 (L) 02/25/2023 0921   GFRAA 49 (L) 03/18/2020 1018    Lipid Panel     Component Value Date/Time   CHOL 120 08/27/2022 1429   TRIG 102 08/27/2022 1429   HDL 60 08/27/2022 1429   CHOLHDL 2.0 08/27/2022 1429   CHOLHDL 3 10/23/2020 1112   VLDL 25.0 10/23/2020 1112   LDLCALC 41 08/27/2022 1429    Lab Results  Component Value Date   HGBA1C 8.8 (A) 10/11/2022   HGBA1C 8.8 10/11/2022   HGBA1C 8.8 (A) 10/11/2022   HGBA1C 8.8 (A) 10/11/2022      Imaging I have reviewed the images obtained:  CT-head- no acute process  CTA head and neck NO LVO   Assessment:  77 yo male with 3 episodes of painless left vision loss upon awakening this morning.   Recommendations: - admit for stroke workup  - HgbA1c, fasting lipid panel - MRI of the brain without contrast - Frequent neuro checks - Echocardiogram - Continue home Eliquis  - Risk factor modification - Telemetry monitoring - PT consult, OT consult, Speech consult - Stroke team to follow  Gevena Mart DNP, ACNPC-AG  [facility]   NEUROHOSPITALIST ADDENDUM Performed a face to face  diagnostic evaluation.   I have reviewed the contents of history and physical exam as documented by PA/ARNP/Resident and agree with above documentation.  I have discussed and formulated the above plan as documented. Edits to the note have been made as needed.  Impression/Key exam findings/Plan: reports 3 brief episodes of severe vertigo and worsening of left half of his vision this AM. The episodes last about 20 secs. He reports he has noticed deterioration of the vision on the left over the last year. Upon comparing his prior MRI from march 2023 with his MRI yesterday, has a new but chronic appearing R PCA stroke. Suspect that explains his L hemianopsia noted on exam. He also mentions that in the past, he has had an attempted stenting of an artery in the back of his brain. On review of chart, seems like he had a failed attempt at stenting of his severe basilar artery stenosis.  I suspect that the episodes of vertigo with worsening vision that he had, could be due to vertebrobasilar insufficiency. Chart review also shows that he has been seen by Dr. Karel Jarvis back in march of 2023  for episode of dizziness, blurred vision lasting 30-60 secs and the description seems very similar to what he is telling me today.  He is already on Apixaban but I do think we should atleast consider adding an antiplatelet agent to this. Chart review shows that he had been on plavix and eliquis in the past but was taken off of it due to concern for anemia from GI bleeds.  Will have stroke team weigh in on this but we may need to also consider getting GI to weigh in to see if it is reasonable to consider doing a combination of Aspirin and Eliquis.  Erick Blinks, MD [facility] [phone]   If 7pm to 7am, please call on call as listed on AMION.

## 2023-03-16 NOTE — H&P (Signed)
History and Physical   Marvin Salinas MVH:[id number] DOB: [birth year] DOA: 03/16/2023  PCP: Natalia Leatherwood, DO   Patient coming from: Home  Chief Complaint: Vision loss  HPI: Marvin Salinas is a 77 y.o. male with medical history significant of HTN, HLD, CVA, PAD status post stenting, carotid artery disease, CAD status post CABG, atrial flutter, diabetes, CKD 3, OSA, lambda light chain myeloma, depression presenting with vision loss.  Patient presenting with worsening left-sided vision loss.  Reports he has had 2 episodes of vision loss in his left eye.  He reports the first episode was about a year ago and he never regained vision in his left lateral visual field.  The episode today consisted of 30 seconds of blurry vision with some spinning shapes in his left eye with normal vision in his right eye.  Vision has returned to baseline of largely normal vision with loss of vision in the left lateral visual field.  Also reporting some chest pain for the past several weeks.  Denies fevers, chills, shortness of breath, abdominal pain, constipation, diarrhea, nausea, vomiting.  ED Course: Vital signs in the ED notable for blood pressure in the 120s to 150s systolic, heart rate in the 50s to 60s.  Lab workup included BMP with creatinine stable 1.54, glucose 2 2.  CBC with hemoglobin stable at 12.1, platelets stable 149.  PT mildly elevated at 15.6.  PTT and INR normal.  Troponin negative x 2.  Chest x-ray showed no acute normality.  CT of the head and neck showed no acute intracranial abnormality, did show old infarcts, no large vessel occlusion, there was noted to be severe basilar stenosis, severe vertebral artery stenosis, carotid artery disease with 50 to 60% stenosis bilaterally.  Neurology was consulted and recommended admission for amaurosis fugax/TIA workup.  Review of Systems: As per HPI otherwise all other systems reviewed and are negative.  Past Medical History:  Diagnosis Date   Acute kidney  injury superimposed on chronic kidney disease (HCC) 10/12/2017   Atrial fibrillation with RVR (HCC) 10/27/2017   Back pain    Basilar artery stenosis    on chronic Plavix   Benign prostatic hypertrophy without urinary obstruction 04/17/2014   Carotid artery disease (HCC) 10/06/2010   Carotid US 04/2019: Bilat ICA 40-59; L subclavian stenosis  // Carotid US 11/21: Bilat ICA 40-59; bilateral subclavian stenosis // Carotid US 11/22: Bilateral ICA 40-59; left subclavian stenosis // Carotid US 04/28/2022: Bilateral ICA 40-59; left subclavian stenosis   Cerebral vascular disease    with prior TIA's; followed by Dr. Sandria Manly   Depression, neurotic 11/21/2013   Diabetes mellitus    on insulin   Enthesopathy of ankle and tarsus 09/04/2007   Overview:  Metatarsalgia    Enthesopathy of ankle and tarsus 09/04/2007   Overview:  Metatarsalgia  Formatting of this note might be different from the original. Metatarsalgia  10/1 IMO update   History of renal calculi    Hyperlipidemia    Hypertension    Ischemic heart disease    prior PCI to RCA in 1989. S/P PCI to LAD and OM in 1992. S/P PCI to first DX in 2000. S/P CABG x 3 in May 2011   Lambda light chain myeloma (HCC) 10/28/2021   Left hip pain 01/03/2020   OSA (obstructive sleep apnea) 01/11/2018   AHI 18.1 and SaO2 low 73%   PAD (peripheral artery disease) (HCC) 11/18/2017   ABIs/Arterial US 04/2019: R 1.21; L 0.66 // R SFA 30-49,  stable > 50 CIA and EIA stenosis; L > 50 CIA stenosis (likely represents severe stenosis or short segment occlusion)   Peripheral neuropathy    Pneumonia 02/2011   Sacroiliac joint dysfunction of left side 01/03/2020   Stroke (HCC) 04/16/2001   small right cerebellar infarct on 04/16/2001 at that time he was found to have proximal left vertebral artery, proximal left common carotid artery and both external carotid artery stenosis as well as intracranial stenosis involving mid basilar artery- 07/2011 add questionable TIA     Past Surgical History:  Procedure Laterality Date    NASAL ENDOSCOPY  01/11/2020   chronic rhinitis w/o evidence of acute sinusitis, bilateral inferior turbinate hypertrophy   ABDOMINAL AORTOGRAM W/LOWER EXTREMITY Bilateral 05/30/2019   Procedure: ABDOMINAL AORTOGRAM W/LOWER EXTREMITY;  Surgeon: Iran Ouch, MD;  Location: [facility];  Service: Cardiovascular;  Laterality: Bilateral;   ANGIOPLASTY  1989   right coronary artery   ANGIOPLASTY  1992   LAD and OM   ANGIOPLASTY  1998   First DX   BRAIN SURGERY     on prior records   CORONARY ARTERY BYPASS GRAFT  11/12/2009   LIMA to LAD, SVG to OM and SVG to RCA   CORONARY STENT PLACEMENT  2000   Stent to LAD/Circumflex with angioplasty to first diagonal    LEFT HEART CATH AND CORS/GRAFTS ANGIOGRAPHY N/A 10/13/2017   Procedure: LEFT HEART CATH AND CORS/GRAFTS ANGIOGRAPHY;  Surgeon: Yvonne Kendall, MD;  Location: [facility];  Service: Cardiovascular;  Laterality: N/A;    Social History  reports that he quit smoking about 8 months ago. His smoking use included cigarettes. He started smoking about 54 years ago. He has a 13.5 pack-year smoking history. He has never used smokeless tobacco. He reports that he does not drink alcohol and does not use drugs.  Allergies  Allergen Reactions   Codeine Nausea And Vomiting   Lisinopril Cough   Nsaids Other (See Comments)    CKD   Dapagliflozin Rash   Latex Hives    Family History  Problem Relation Age of Onset   Depression Mother    Early death Mother    Kidney disease Mother    Ovarian cancer Mother    Hypertension Father    Heart disease Father    Heart attack Father    Asthma Brother    Diabetes Brother    Stroke Other        Uncle   Diabetes Sister    Colon cancer Neg Hx    Rectal cancer Neg Hx    Stomach cancer Neg Hx    Esophageal cancer Neg Hx   Reviewed on admission  Prior to Admission medications   Medication Sig Start Date End Date Taking?  Authorizing Provider  acetaminophen (TYLENOL) 500 MG tablet Take 1,000 mg by mouth every 6 (six) hours as needed.    [provider]  apixaban (ELIQUIS) 5 MG TABS tablet TAKE 1 TABLET BY MOUTH TWICE DAILY 02/22/23   Iran Ouch, MD  Carboxymethylcell-Hypromellose (GENTEAL OP) Place 1 drop into both eyes daily as needed (dry eyes).    [provider]  dofetilide (TIKOSYN) 125 MCG capsule Take 1 capsule (125 mcg total) by mouth 2 (two) times daily. 01/20/23   [name], Deloris Ping, MD  DULoxetine (CYMBALTA) 30 MG capsule Take 1 capsule (30 mg total) by mouth daily. 01/14/23   Josph Macho, MD  ezetimibe (ZETIA) 10 MG tablet Take 1 tablet (10  mg total) by mouth daily. 06/30/22   Kuneff, Renee A, DO  famciclovir (FAMVIR) 250 MG tablet Take 1 tablet (250 mg total) by mouth daily. 10/22/22   Josph Macho, MD  gabapentin (NEURONTIN) 300 MG capsule Take 1 capsule (300 mg total) by mouth 3 (three) times daily. 02/25/23   Josph Macho, MD  glucose blood (ONETOUCH VERIO) test strip USE UP TO FOUR TIMES DAILY AS DIRECTED. 03/03/23   Kuneff, Renee A, DO  insulin glargine (LANTUS) 100 UNIT/ML Solostar Pen Inject 40 Units into the skin 2 (two) times daily. 02/15/23   Kuneff, Renee A, DO  Insulin Pen Needle 31G X 4 MM MISC Use BID with lantus 10/11/22   Kuneff, Renee A, DO  Lancets (ONETOUCH DELICA PLUS LANCET33G) MISC USE UP TO FOUR TIMES DAILY AS DIRECTED 10/28/22   Kuneff, Renee A, DO  loratadine (CLARITIN) 10 MG tablet Take 10 mg by mouth daily as needed for allergies.    [provider]  metoprolol tartrate (LOPRESSOR) 25 MG tablet Take 0.5 tablets (12.5 mg total) by mouth 2 (two) times daily. 10/26/22   [name], Deloris Ping, MD  nitroGLYCERIN (NITROSTAT) 0.4 MG SL tablet Place 0.4 mg under the tongue every 5 (five) minutes as needed for chest pain. Patient not taking: Reported on 12/28/2022    [provider]  olmesartan (BENICAR) 5 MG tablet Take 5 mg by mouth daily. 02/24/23    [provider]  ondansetron (ZOFRAN) 8 MG tablet Take 1 tablet (8 mg total) by mouth every 8 (eight) hours as needed for nausea or vomiting. 10/22/22   Josph Macho, MD  polyethylene glycol powder (GLYCOLAX/MIRALAX) 17 GM/SCOOP powder Take by mouth every other day.    [provider]  rosuvastatin (CRESTOR) 20 MG tablet Take 1 tablet (20 mg total) by mouth at bedtime. 10/11/22   Kuneff, Renee A, DO  Semaglutide (RYBELSUS) 14 MG TABS Take 1 tablet (14 mg total) by mouth daily. 10/11/22   Kuneff, Renee A, DO  prochlorperazine (COMPAZINE) 10 MG tablet Take 1 tablet (10 mg total) by mouth every 6 (six) hours as needed (Nausea or vomiting). 11/03/21 02/17/22  Josph Macho, MD    Physical Exam: Vitals:   03/16/23 1415 03/16/23 1439 03/16/23 1445 03/16/23 1600  BP: (!) 146/67  (!) 156/73 (!) 143/82  Pulse: 61  65 (!) 57  Resp: 19  17 (!) 23  Temp:  98.2 F (36.8 C)    TempSrc:      SpO2: 97%  98% 98%  Weight:      Height:        Physical Exam Constitutional:      General: He is not in acute distress.    Appearance: Normal appearance.  HENT:     Head: Normocephalic and atraumatic.     Mouth/Throat:     Mouth: Mucous membranes are moist.     Pharynx: Oropharynx is clear.  Eyes:     Extraocular Movements: Extraocular movements intact.     Pupils: Pupils are equal, round, and reactive to light.  Cardiovascular:     Rate and Rhythm: Normal rate and regular rhythm.     Pulses: Normal pulses.     Heart sounds: Normal heart sounds.  Pulmonary:     Effort: Pulmonary effort is normal. No respiratory distress.     Breath sounds: Normal breath sounds.  Abdominal:     General: Bowel sounds are normal. There is no distension.     Palpations:  Abdomen is soft.     Tenderness: There is no abdominal tenderness.  Musculoskeletal:        General: No swelling or deformity.  Skin:    General: Skin is warm and dry.  Neurological:     Comments: Mental Status: Patient is  awake, alert, oriented No signs of aphasia or neglect Cranial Nerves: II: Pupils equal, round, and reactive (decreased vision at left lateral visual field.) III,IV, VI: EOMI without ptosis or diploplia.  V: Facial sensation is symmetric to light touch. VII: Facial movement is symmetric.  VIII: hearing is intact to voice X: Uvula elevates symmetrically XI: Shoulder shrug is symmetric. XII: tongue is midline without atrophy or fasciculations.  Motor: Good effort thorughout, at Least 5/5 bilateral UE, 5/5 bilateral lower extremitiy  Sensory: Sensation is grossly intact bilateral UEs & LEs Cerebellar: Finger-Nose intact bilalat    Labs on Admission: I have personally reviewed following labs and imaging studies  CBC: Recent Labs  Lab 03/16/23 1302  WBC 5.0  HGB 12.1*  HCT 35.3*  MCV 93.9  PLT 149*    Basic Metabolic Panel: Recent Labs  Lab 03/16/23 1302  NA 139  K 4.5  CL 105  CO2 30  GLUCOSE 202*  BUN 21  CREATININE 1.54*  CALCIUM 9.2    GFR: Estimated Creatinine Clearance: 38.2 mL/min (A) (by C-G formula based on SCr of 1.54 mg/dL (H)).  Liver Function Tests: No results for input(s): "AST", "ALT", "ALKPHOS", "BILITOT", "PROT", "ALBUMIN" in the last 168 hours.  Urine analysis:    Component Value Date/Time   COLORURINE YELLOW 05/04/2022 1238   APPEARANCEUR CLEAR 05/04/2022 1238   LABSPEC 1.020 05/04/2022 1238   PHURINE 5.5 05/04/2022 1238   GLUCOSEU NEGATIVE 05/04/2022 1238   HGBUR TRACE (A) 05/04/2022 1238   BILIRUBINUR NEGATIVE 05/04/2022 1238   KETONESUR NEGATIVE 05/04/2022 1238   PROTEINUR 30 (A) 05/04/2022 1238   UROBILINOGEN 0.2 06/16/2014 1211   NITRITE NEGATIVE 05/04/2022 1238   LEUKOCYTESUR NEGATIVE 05/04/2022 1238    Radiological Exams on Admission: CT ANGIO HEAD NECK W WO CM  Result Date: 03/16/2023 CLINICAL DATA:  Neuro deficit, acute, stroke suspected. Transient bilateral vision loss. EXAM: CT ANGIOGRAPHY HEAD AND NECK WITH AND WITHOUT  CONTRAST TECHNIQUE: Multidetector CT imaging of the head and neck was performed using the standard protocol during bolus administration of intravenous contrast. Multiplanar CT image reconstructions and MIPs were obtained to evaluate the vascular anatomy. Carotid stenosis measurements (when applicable) are obtained utilizing NASCET criteria, using the distal internal carotid diameter as the denominator. RADIATION DOSE REDUCTION: This exam was performed according to the departmental dose-optimization program which includes automated exposure control, adjustment of the mA and/or kV according to patient size and/or use of iterative reconstruction technique. CONTRAST:  75mL OMNIPAQUE IOHEXOL 350 MG/ML SOLN COMPARISON:  MRI of the brain September 18, 2021. FINDINGS: CT HEAD FINDINGS Brain: No evidence of acute infarction, hemorrhage, hydrocephalus, extra-axial collection or mass lesion/mass effect. Chronic infarct in the right parietooccipital region. Small remote infarcts are also seen in the left cerebellar hemisphere. Vascular: Calcified plaques in the bilateral carotid siphons. Skull: Normal. Negative for fracture or focal lesion. Sinuses/Orbits: No acute finding. Other: None. Review of the MIP images confirms the above findings CTA NECK FINDINGS Aortic arch: Standard branching. Imaged portion shows no evidence of aneurysm or dissection. Calcified plaques are seen in the aortic arch extending to the origin of the major neck arteries. No significant stenosis at the innominate and left subclavian artery origins. Left  common carotid artery origin is obscured by dense contrast in the left brachiocephalic vein. Right carotid system: Atherosclerotic changes with mixed density plaque in the proximal right common carotid artery without significant stenosis. Atherosclerotic changes of the right carotid bifurcation with mixed density plaques resulting in approximately 50% stenosis. Left carotid system: The origin and proximal  segments of the left common carotid arteries are obscured by dense contrast on adjacent vein. Mild atherosclerotic changes along the left common carotid artery without hemodynamically significant stenosis. Atherosclerotic changes of the left carotid bifurcation resulting approximately 60% stenosis. Vertebral arteries: Atherosclerotic changes at the origin of the right vertebral artery with severe stenosis. There is also moderate stenosis of the right vertebral artery at the V3-V4 junction. Atherosclerotic changes are also seen at the origin of the dominant left vertebral artery resulting in severe stenosis. There is also at least moderate stenosis of the left vertebral artery at the C5-6 level secondary to spondylosis. Skeleton: Degenerative changes of the cervical spine. Postsurgical changes from prior sternotomy. Other neck: Negative. Upper chest: Negative. Review of the MIP images confirms the above findings CTA HEAD FINDINGS Anterior circulation: Calcified plaques in the bilateral carotid siphons with moderate stenosis at the paraclinoid segment on the right. The bilateral MCA and ACA vascular trees are maintained without high-grade stenosis, aneurysm or other significant vascular abnormality. Posterior circulation: Atherosclerotic changes of the intracranial right vertebral artery with severe stenosis at the vertebrobasilar junction. Atherosclerotic changes of the intracranial left vertebral artery with mild stenosis. Severe atherosclerotic changes of the basilar artery with severe mid/proximal stenosis. Mild stenosis at the origin of the right posterior cerebral artery. The left posterior cerebral artery originates from the left ICA and is maintained. Venous sinuses: As permitted by contrast timing, patent. Anatomic variants: Fetal left PCA. Review of the MIP images confirms the above findings IMPRESSION: 1. No acute intracranial abnormality. 2. Chronic infarct in the right parietooccipital region. Small  remote infarcts in the left cerebellar hemisphere. 3. No intracranial large vessel occlusion. 4. Atherosclerotic changes of the basilar artery with severe mid/proximal stenosis. 5. Atherosclerotic changes of the bilateral carotid siphons with moderate stenosis at the paraclinoid segment on the right. 6. Atherosclerotic changes of the bilateral vertebral arteries with severe stenosis their origin. 7. Atherosclerotic changes of the left carotid bifurcation resulting in approximately 60% stenosis. 8. Atherosclerotic changes of the right carotid bifurcation resulting approximately 50% stenosis. 9. Aortic atherosclerosis. Aortic Atherosclerosis (ICD10-I70.0). Electronically Signed   By: Baldemar Lenis M.D.   On: 03/16/2023 15:35   DG Chest 2 View  Result Date: 03/16/2023 CLINICAL DATA:  Chest pain. EXAM: CHEST - 2 VIEW COMPARISON:  08/12/2022. FINDINGS: Bilateral lung fields are clear. Bilateral costophrenic angles are clear. Normal cardio-mediastinal silhouette. Sternotomy wires noted, status post CABG. Aortic arch calcifications seen. No acute osseous abnormalities. The soft tissues are within normal limits. IMPRESSION: No acute cardiopulmonary process. Electronically Signed   By: Jules Schick M.D.   On: 03/16/2023 15:18    EKG: Independently reviewed.  Sinus rhythm at 60 bpm.  Assessment/Plan Principal Problem:   Amaurosis fugax of left eye Active Problems:   Carotid artery disease (HCC)   S/P CABG x 3   Hyperlipidemia   Chronic kidney disease (CKD), stage III (moderate) (HCC)   Controlled diabetes mellitus type 2 with complications (HCC)   History of stroke   Paroxysmal atrial flutter (HCC)   PAD (peripheral artery disease) (HCC)   OSA (obstructive sleep apnea)   Hypertension   Lambda light  chain myeloma (HCC)   Amaurosis fugax/TIA > Presenting with 2 episodes of vision loss in the past year.  1 year ago with residual vision loss of the left lateral visual field.  Now with  repeat episode of significant blurriness, darkness, spinning. > Also noting some radiation of pain in his neck. > Patient has returned to baseline. > CTA head and neck in the ED with severe stenosis of the basilar artery, vertebral artery and carotid artery disease also noted.  No new infarcts noted on CT. > Neurology consulted in the ED recommending admission and will see the patient. - Appreciate neurology recommendations and assistance - Allow for permissive HTN for now (systolic < 220 and diastolic < 120)  - On Eliquis - Continue statin and Zetia - Echocardiogram   - MRI brain - A1C  - Lipid panel  - Tele monitoring  - SLP eval - PT/OT  Hypertension - Transient hypertension for now as above pending neurology input. - Continue metoprolol - Hold olmesartan  Hyperlipidemia - Continue home rosuvastatin and Zetia  PAD CAD Carotid artery disease > Known history of PAD status post left common iliac stenting, CAD status post CABG, carotid artery disease described above. - Continue home Eliquis - Continue home metoprolol - Holding home olmesartan as above  Atrial flutter - Continue home Tikosyn, Eliquis - Continue home metoprolol  Diabetes > Prescribed 40 units long-acting insulin twice daily at home, but currently taking only 15 to 20 units for patient. -15 units twice daily long-acting insulin - SSI  OSA - CPAP  CKD 3 > Creatinine stable in ED. - Trend renal function and electrolytes  Lambda light chain myeloma > Has been on chemotherapy until earlier this month when treatment has been held by his oncologist. - Continue to monitor  Depression - Continue home duloxetine  DVT prophylaxis: Eliquis Code Status:   Full Family Communication:  None on admission  Disposition Plan:   Patient is from:  Home  Anticipated DC to:  Home  Anticipated DC date:  1 to 3 days  Anticipated DC barriers: None  Consults called:  Neurology Admission status:  Observation,  telemetry  Severity of Illness: The appropriate patient status for this patient is OBSERVATION. Observation status is judged to be reasonable and necessary in order to provide the required intensity of service to ensure the patient's safety. The patient's presenting symptoms, physical exam findings, and initial radiographic and laboratory data in the context of their medical condition is felt to place them at decreased risk for further clinical deterioration. Furthermore, it is anticipated that the patient will be medically stable for discharge from the hospital within 2 midnights of admission.    Synetta Fail MD [facility]  How to contact the [facility] Attending or Consulting provider 7A - 7P or covering provider during after hours 7P -7A, for this patient?   Check the care team in [facility] and look for a) attending/consulting [facility] provider listed and b) the [facility] team listed Log into [email or web address] and use [place]'s universal password to access. If you do not have the password, please contact the hospital operator. Locate the [facility] provider you are looking for under [facility] and page to a number that you can be directly reached. If you still have difficulty reaching the provider, please page the [facility] (Director on Call) for the Hospitalists listed on amion for assistance.  03/16/2023, 5:26 PM

## 2023-03-16 NOTE — ED Provider Notes (Signed)
[place] [facility] AT [facility] Provider Note   CSN: [id number] Arrival date & time: 03/16/23  1241     History  Chief Complaint  Patient presents with   Chest Pain    Marvin Salinas is a 77 y.o. male with PMHx PAD, Afib, CAD, stroke, DM, HLD, HTN, multiple myeloma who presents to ED concerned for intermittent vision loss of left eye and sharp pain in neck. Symptoms has been happening over the past year, but increased in severity today. Symptom is not associated with rest vs exertion. Also endorses receiving chemo over the past 17 months for "bone cancer".  Of note, patient also complaining of chest pain x3 weeks. Has had workup for this in the past and follows with outpatient provider.  Denies fever, dyspnea, cough, nausea, vomiting. Denies LOC, seizures, head trauma. Denies weakness, confusion, balance issues.    Chest Pain      Home Medications Prior to Admission medications   Medication Sig Start Date End Date Taking? Authorizing Provider  acetaminophen (TYLENOL) 500 MG tablet Take 1,000 mg by mouth every 6 (six) hours as needed.    [provider]  apixaban (ELIQUIS) 5 MG TABS tablet TAKE 1 TABLET BY MOUTH TWICE DAILY 02/22/23   Iran Ouch, MD  Carboxymethylcell-Hypromellose (GENTEAL OP) Place 1 drop into both eyes daily as needed (dry eyes).    [provider]  dofetilide (TIKOSYN) 125 MCG capsule Take 1 capsule (125 mcg total) by mouth 2 (two) times daily. 01/20/23   [name], Deloris Ping, MD  DULoxetine (CYMBALTA) 30 MG capsule Take 1 capsule (30 mg total) by mouth daily. 01/14/23   Josph Macho, MD  ezetimibe (ZETIA) 10 MG tablet Take 1 tablet (10 mg total) by mouth daily. 06/30/22   Kuneff, Renee A, DO  famciclovir (FAMVIR) 250 MG tablet Take 1 tablet (250 mg total) by mouth daily. 10/22/22   Josph Macho, MD  gabapentin (NEURONTIN) 300 MG capsule Take 1 capsule (300 mg total) by mouth 3 (three) times daily. 02/25/23    Josph Macho, MD  glucose blood (ONETOUCH VERIO) test strip USE UP TO FOUR TIMES DAILY AS DIRECTED. 03/03/23   Kuneff, Renee A, DO  insulin glargine (LANTUS) 100 UNIT/ML Solostar Pen Inject 40 Units into the skin 2 (two) times daily. 02/15/23   Kuneff, Renee A, DO  Insulin Pen Needle 31G X 4 MM MISC Use BID with lantus 10/11/22   Kuneff, Renee A, DO  Lancets (ONETOUCH DELICA PLUS LANCET33G) MISC USE UP TO FOUR TIMES DAILY AS DIRECTED 10/28/22   Kuneff, Renee A, DO  loratadine (CLARITIN) 10 MG tablet Take 10 mg by mouth daily as needed for allergies.    [provider]  metoprolol tartrate (LOPRESSOR) 25 MG tablet Take 0.5 tablets (12.5 mg total) by mouth 2 (two) times daily. 10/26/22   [name], Deloris Ping, MD  nitroGLYCERIN (NITROSTAT) 0.4 MG SL tablet Place 0.4 mg under the tongue every 5 (five) minutes as needed for chest pain. Patient not taking: Reported on 12/28/2022    [provider]  olmesartan (BENICAR) 5 MG tablet Take 5 mg by mouth daily. 02/24/23   [provider]  ondansetron (ZOFRAN) 8 MG tablet Take 1 tablet (8 mg total) by mouth every 8 (eight) hours as needed for nausea or vomiting. 10/22/22   Josph Macho, MD  polyethylene glycol powder (GLYCOLAX/MIRALAX) 17 GM/SCOOP powder Take by mouth every other day.    [provider]  rosuvastatin (CRESTOR) 20 MG tablet Take 1 tablet (20 mg total) by mouth at bedtime. 10/11/22   Kuneff, Renee A, DO  Semaglutide (RYBELSUS) 14 MG TABS Take 1 tablet (14 mg total) by mouth daily. 10/11/22   Kuneff, Renee A, DO  prochlorperazine (COMPAZINE) 10 MG tablet Take 1 tablet (10 mg total) by mouth every 6 (six) hours as needed (Nausea or vomiting). 11/03/21 02/17/22  Josph Macho, MD      Allergies    Codeine, Lisinopril, Nsaids, Dapagliflozin, and Latex    Review of Systems   Review of Systems  Cardiovascular:  Positive for chest pain.    Physical Exam Updated Vital Signs BP (!) 143/82   Pulse (!) 57   Temp 98.2 F  (36.8 C)   Resp (!) 23   Ht 5\' 8"  (1.727 m)   Wt 70.8 kg   SpO2 98%   BMI 23.72 kg/m  Physical Exam Vitals and nursing note reviewed.  Constitutional:      General: He is not in acute distress. HENT:     Head: Normocephalic and atraumatic.     Mouth/Throat:     Mouth: Mucous membranes are moist.     Pharynx: No oropharyngeal exudate or posterior oropharyngeal erythema.  Eyes:     General: No scleral icterus.       Right eye: No discharge.        Left eye: No discharge.     Conjunctiva/sclera: Conjunctivae normal.  Cardiovascular:     Rate and Rhythm: Normal rate and regular rhythm.     Pulses: Normal pulses.     Heart sounds: Normal heart sounds. No murmur heard. Pulmonary:     Effort: Pulmonary effort is normal. No respiratory distress.     Breath sounds: Normal breath sounds. No wheezing, rhonchi or rales.  Abdominal:     Tenderness: There is no abdominal tenderness.  Musculoskeletal:     Right lower leg: No edema.     Left lower leg: No edema.  Skin:    General: Skin is warm and dry.     Findings: No rash.  Neurological:     General: No focal deficit present.     Mental Status: He is alert and oriented to person, place, and time. Mental status is at baseline.     Comments: GCS 15. Speech is goal oriented. No deficits appreciated to CN III-XII; symmetric eyebrow raise, no facial drooping, tongue midline. Patient has equal grip strength bilaterally with 5/5 strength against resistance in all major muscle groups bilaterally. Sensation to light touch intact. Patient moves extremities without ataxia.  Patient ambulatory with steady gait.   Psychiatric:        Mood and Affect: Mood normal.     ED Results / Procedures / Treatments   Labs (all labs ordered are listed, but only abnormal results are displayed) Labs Reviewed  BASIC METABOLIC PANEL - Abnormal; Notable for the following components:      Result Value   Glucose, Bld 202 (*)    Creatinine, Ser 1.54 (*)     GFR, Estimated 46 (*)    Anion gap 4 (*)    All other components within normal limits  CBC - Abnormal; Notable for the following components:   RBC 3.76 (*)    Hemoglobin 12.1 (*)    HCT 35.3 (*)    Platelets 149 (*)    All other components within normal limits  PROTIME-INR - Abnormal; Notable for the following components:  Prothrombin Time 15.6 (*)    All other components within normal limits  APTT  TROPONIN I (HIGH SENSITIVITY)  TROPONIN I (HIGH SENSITIVITY)    EKG EKG Interpretation Date/Time:  Wednesday March 16 2023 12:51:37 EDT Ventricular Rate:  60 PR Interval:  154 QRS Duration:  82 QT Interval:  422 QTC Calculation: 422 R Axis:   -51  Text Interpretation: Normal sinus rhythm Left axis deviation Abnormal ECG Confirmed by Alvino Blood ([id number]) on 03/16/2023 3:04:04 PM  Radiology CT ANGIO HEAD NECK W WO CM  Result Date: 03/16/2023 CLINICAL DATA:  Neuro deficit, acute, stroke suspected. Transient bilateral vision loss. EXAM: CT ANGIOGRAPHY HEAD AND NECK WITH AND WITHOUT CONTRAST TECHNIQUE: Multidetector CT imaging of the head and neck was performed using the standard protocol during bolus administration of intravenous contrast. Multiplanar CT image reconstructions and MIPs were obtained to evaluate the vascular anatomy. Carotid stenosis measurements (when applicable) are obtained utilizing NASCET criteria, using the distal internal carotid diameter as the denominator. RADIATION DOSE REDUCTION: This exam was performed according to the departmental dose-optimization program which includes automated exposure control, adjustment of the mA and/or kV according to patient size and/or use of iterative reconstruction technique. CONTRAST:  75mL OMNIPAQUE IOHEXOL 350 MG/ML SOLN COMPARISON:  MRI of the brain September 18, 2021. FINDINGS: CT HEAD FINDINGS Brain: No evidence of acute infarction, hemorrhage, hydrocephalus, extra-axial collection or mass lesion/mass effect. Chronic infarct in  the right parietooccipital region. Small remote infarcts are also seen in the left cerebellar hemisphere. Vascular: Calcified plaques in the bilateral carotid siphons. Skull: Normal. Negative for fracture or focal lesion. Sinuses/Orbits: No acute finding. Other: None. Review of the MIP images confirms the above findings CTA NECK FINDINGS Aortic arch: Standard branching. Imaged portion shows no evidence of aneurysm or dissection. Calcified plaques are seen in the aortic arch extending to the origin of the major neck arteries. No significant stenosis at the innominate and left subclavian artery origins. Left common carotid artery origin is obscured by dense contrast in the left brachiocephalic vein. Right carotid system: Atherosclerotic changes with mixed density plaque in the proximal right common carotid artery without significant stenosis. Atherosclerotic changes of the right carotid bifurcation with mixed density plaques resulting in approximately 50% stenosis. Left carotid system: The origin and proximal segments of the left common carotid arteries are obscured by dense contrast on adjacent vein. Mild atherosclerotic changes along the left common carotid artery without hemodynamically significant stenosis. Atherosclerotic changes of the left carotid bifurcation resulting approximately 60% stenosis. Vertebral arteries: Atherosclerotic changes at the origin of the right vertebral artery with severe stenosis. There is also moderate stenosis of the right vertebral artery at the V3-V4 junction. Atherosclerotic changes are also seen at the origin of the dominant left vertebral artery resulting in severe stenosis. There is also at least moderate stenosis of the left vertebral artery at the C5-6 level secondary to spondylosis. Skeleton: Degenerative changes of the cervical spine. Postsurgical changes from prior sternotomy. Other neck: Negative. Upper chest: Negative. Review of the MIP images confirms the above findings  CTA HEAD FINDINGS Anterior circulation: Calcified plaques in the bilateral carotid siphons with moderate stenosis at the paraclinoid segment on the right. The bilateral MCA and ACA vascular trees are maintained without high-grade stenosis, aneurysm or other significant vascular abnormality. Posterior circulation: Atherosclerotic changes of the intracranial right vertebral artery with severe stenosis at the vertebrobasilar junction. Atherosclerotic changes of the intracranial left vertebral artery with mild stenosis. Severe atherosclerotic changes of the basilar artery with  severe mid/proximal stenosis. Mild stenosis at the origin of the right posterior cerebral artery. The left posterior cerebral artery originates from the left ICA and is maintained. Venous sinuses: As permitted by contrast timing, patent. Anatomic variants: Fetal left PCA. Review of the MIP images confirms the above findings IMPRESSION: 1. No acute intracranial abnormality. 2. Chronic infarct in the right parietooccipital region. Small remote infarcts in the left cerebellar hemisphere. 3. No intracranial large vessel occlusion. 4. Atherosclerotic changes of the basilar artery with severe mid/proximal stenosis. 5. Atherosclerotic changes of the bilateral carotid siphons with moderate stenosis at the paraclinoid segment on the right. 6. Atherosclerotic changes of the bilateral vertebral arteries with severe stenosis their origin. 7. Atherosclerotic changes of the left carotid bifurcation resulting in approximately 60% stenosis. 8. Atherosclerotic changes of the right carotid bifurcation resulting approximately 50% stenosis. 9. Aortic atherosclerosis. Aortic Atherosclerosis (ICD10-I70.0). Electronically Signed   By: Baldemar Lenis M.D.   On: 03/16/2023 15:35   DG Chest 2 View  Result Date: 03/16/2023 CLINICAL DATA:  Chest pain. EXAM: CHEST - 2 VIEW COMPARISON:  08/12/2022. FINDINGS: Bilateral lung fields are clear. Bilateral  costophrenic angles are clear. Normal cardio-mediastinal silhouette. Sternotomy wires noted, status post CABG. Aortic arch calcifications seen. No acute osseous abnormalities. The soft tissues are within normal limits. IMPRESSION: No acute cardiopulmonary process. Electronically Signed   By: Jules Schick M.D.   On: 03/16/2023 15:18    Procedures Procedures    Medications Ordered in ED Medications  iohexol (OMNIPAQUE) 350 MG/ML injection 75 mL (75 mLs Intravenous Contrast Given 03/16/23 1425)    ED Course/ Medical Decision Making/ A&P Clinical Course as of 03/16/23 1634  Wed Mar 16, 2023  1544 EKG 12-Lead [SM]    Clinical Course User Index [SM] Dorthy Cooler, PA-C                                 Medical Decision Making Amount and/or Complexity of Data Reviewed Labs: ordered. Radiology: ordered.   This patient presents to the ED for concern of AMS, this involves an extensive number of treatment options, and is a complaint that carries with it a high risk of complications and morbidity.  The differential diagnosis includes CVA, ICH, intracranial mass, critical dehydration, heptatic dysfunction, uremia, hypercarbia, intoxication/withdrawal, endocrine abnormality, sepsis/infection.   Co morbidities that complicate the patient evaluation  PAD, Afib, CAD, stroke, DM, HLD, HTN   Additional history obtained:  Dr. Claiborne Billings PCP   Lab Tests:  I Ordered, and personally interpreted labs.  The pertinent results include: -CBC: mild anemia with hgb 12.2; no leukocytosis -BMP: Cr elevated at baseline; hyperglycemia at 202 -Troponin: flat at 10   Cardiac Monitoring: / EKG:  The patient was maintained on a cardiac monitor.  I personally viewed and interpreted the cardiac monitored which showed an underlying rhythm of: sinus rhythm without acute ST changes or arrhythmias   Problem List / ED Course / Critical interventions / Medication management  Admitting patient for  Amaurosis fugax PMHx PAD, Afib, CAD, stroke, DM, HLD, HTN, multiple myeloma receiving chemo for the past 17 months Patient presented for intermittent vision loss on lateral left eye and neck pain.  Physical and neuro exam unremarkable.  Patient is afebrile with stable vitals.  CBC with mild anemia; no leukocytosis. BMP with hyperglycemia at 202 and elevated Cr at patient's baseline.  CTA head/neck showing no acute intracranial abnormality. Multiple chronic infarcts.  Severe basilar artery stenosis. Severe stenosis of bilateral vertebral arteries. 50% stenosis of right carotid bifurcation and 60% stenosis of left carotid bifurcation. Of note, patient has been with chronic chest pain, follows up with outpatient provider, and delta troponin flat at 10 today. EKG reassuring. Consulted with Neurology (Dr. Selina Cooley) who recommended inpatient admission. Consulted with Dr. Alinda Money who agrees to admit patient. I have reviewed the patients home medicines and have made adjustments as needed   Social Determinants of Health:  none          Final Clinical Impression(s) / ED Diagnoses Final diagnoses:  Amaurosis fugax    Rx / DC Orders ED Discharge Orders     None         Margarita Rana 03/16/23 1710    Lonell Grandchild, MD 03/17/23 1557

## 2023-03-16 NOTE — ED Provider Triage Note (Signed)
Emergency Medicine Provider Triage Evaluation Note  Marvin Salinas , a 77 y.o. male  was evaluated in triage.  Pt complains of squeezing bilateral chest pain since yesterday, additionally endorses some sharp pain in neck. Reports "ministroke" symptoms of 30 seconds of bilateral vision loss this morning, denies weakness, confusion, balance issues.  Review of Systems  Positive: Vision changes, chest pain, neck pain Negative: Weakness, numbness  Physical Exam  BP 123/83 (BP Location: Right Arm)   Pulse 61   Temp 98 F (36.7 C) (Oral)   Resp 19   SpO2 96%  Gen:   Awake, no distress   Resp:  Normal effort  MSK:   Moves extremities without difficulty  Other:  Moves all 4 limbs spontaneously, CN II through XII grossly intact, can ambulate without difficulty, intact sensation throughout.  Medical Decision Making  Medically screening exam initiated at 1:30 PM.  Appropriate orders placed.  [name] was informed that the remainder of the evaluation will be completed by another provider, this initial triage assessment does not replace that evaluation, and the importance of remaining in the ED until their evaluation is complete.  Workup initiated in triage    Olene Floss, PA-C 03/16/23 1330

## 2023-03-16 NOTE — ED Notes (Signed)
Patient back from MRI.

## 2023-03-16 NOTE — Progress Notes (Signed)
Patient seen in bed, new admit to unit, alert and oriented. Settled in bed eating supper in no obvious respiratory distress. Vital signs stable with BP at 175/87. Patient oriented to room and education given.

## 2023-03-16 NOTE — ED Triage Notes (Signed)
Pt here from home with c/o chest pain radiating across his chest and back , some sharp pain in his neck also

## 2023-03-16 NOTE — ED Notes (Signed)
Patient to MRI.

## 2023-03-17 ENCOUNTER — Observation Stay (HOSPITAL_COMMUNITY): Payer: 59

## 2023-03-17 DIAGNOSIS — Z8673 Personal history of transient ischemic attack (TIA), and cerebral infarction without residual deficits: Secondary | ICD-10-CM | POA: Diagnosis not present

## 2023-03-17 DIAGNOSIS — I129 Hypertensive chronic kidney disease with stage 1 through stage 4 chronic kidney disease, or unspecified chronic kidney disease: Secondary | ICD-10-CM | POA: Diagnosis not present

## 2023-03-17 DIAGNOSIS — Z7901 Long term (current) use of anticoagulants: Secondary | ICD-10-CM | POA: Diagnosis not present

## 2023-03-17 DIAGNOSIS — R079 Chest pain, unspecified: Secondary | ICD-10-CM

## 2023-03-17 DIAGNOSIS — E1122 Type 2 diabetes mellitus with diabetic chronic kidney disease: Secondary | ICD-10-CM | POA: Diagnosis not present

## 2023-03-17 DIAGNOSIS — Z951 Presence of aortocoronary bypass graft: Secondary | ICD-10-CM | POA: Diagnosis not present

## 2023-03-17 DIAGNOSIS — Z8579 Personal history of other malignant neoplasms of lymphoid, hematopoietic and related tissues: Secondary | ICD-10-CM | POA: Diagnosis not present

## 2023-03-17 DIAGNOSIS — Z91040 Latex allergy status: Secondary | ICD-10-CM | POA: Diagnosis not present

## 2023-03-17 DIAGNOSIS — G453 Amaurosis fugax: Secondary | ICD-10-CM | POA: Diagnosis not present

## 2023-03-17 DIAGNOSIS — Z87891 Personal history of nicotine dependence: Secondary | ICD-10-CM | POA: Diagnosis not present

## 2023-03-17 DIAGNOSIS — I739 Peripheral vascular disease, unspecified: Secondary | ICD-10-CM | POA: Diagnosis not present

## 2023-03-17 DIAGNOSIS — I2510 Atherosclerotic heart disease of native coronary artery without angina pectoris: Secondary | ICD-10-CM | POA: Diagnosis not present

## 2023-03-17 DIAGNOSIS — I4891 Unspecified atrial fibrillation: Secondary | ICD-10-CM | POA: Diagnosis not present

## 2023-03-17 DIAGNOSIS — I4892 Unspecified atrial flutter: Secondary | ICD-10-CM | POA: Diagnosis not present

## 2023-03-17 DIAGNOSIS — Z955 Presence of coronary angioplasty implant and graft: Secondary | ICD-10-CM | POA: Diagnosis not present

## 2023-03-17 LAB — COMPREHENSIVE METABOLIC PANEL
ALT: 36 U/L (ref 0–44)
AST: 31 U/L (ref 15–41)
Albumin: 3.8 g/dL (ref 3.5–5.0)
Alkaline Phosphatase: 46 U/L (ref 38–126)
Anion gap: 13 (ref 5–15)
BUN: 21 mg/dL (ref 8–23)
CO2: 25 mmol/L (ref 22–32)
Calcium: 9.3 mg/dL (ref 8.9–10.3)
Chloride: 101 mmol/L (ref 98–111)
Creatinine, Ser: 1.8 mg/dL — ABNORMAL HIGH (ref 0.61–1.24)
GFR, Estimated: 38 mL/min — ABNORMAL LOW (ref >60)
Glucose, Bld: 179 mg/dL — ABNORMAL HIGH (ref 70–99)
Potassium: 3.8 mmol/L (ref 3.5–5.1)
Sodium: 139 mmol/L (ref 135–145)
Total Bilirubin: 0.7 mg/dL (ref 0.3–1.2)
Total Protein: 5.7 g/dL — ABNORMAL LOW (ref 6.5–8.1)

## 2023-03-17 LAB — BASIC METABOLIC PANEL
Anion gap: 8 (ref 5–15)
Anion gap: 11 (ref 5–15)
BUN: 23 mg/dL (ref 8–23)
BUN: 29 mg/dL — ABNORMAL HIGH (ref 8–23)
CO2: 26 mmol/L (ref 22–32)
CO2: 25 mmol/L (ref 22–32)
Calcium: 9.2 mg/dL (ref 8.9–10.3)
Calcium: 9.2 mg/dL (ref 8.9–10.3)
Chloride: 105 mmol/L (ref 98–111)
Chloride: 104 mmol/L (ref 98–111)
Creatinine, Ser: 1.77 mg/dL — ABNORMAL HIGH (ref 0.61–1.24)
Creatinine, Ser: 1.78 mg/dL — ABNORMAL HIGH (ref 0.61–1.24)
GFR, Estimated: 39 mL/min — ABNORMAL LOW (ref >60)
GFR, Estimated: 39 mL/min — ABNORMAL LOW (ref >60)
Glucose, Bld: 137 mg/dL — ABNORMAL HIGH (ref 70–99)
Glucose, Bld: 154 mg/dL — ABNORMAL HIGH (ref 70–99)
Potassium: 4.6 mmol/L (ref 3.5–5.1)
Potassium: 4.8 mmol/L (ref 3.5–5.1)
Sodium: 139 mmol/L (ref 135–145)
Sodium: 140 mmol/L (ref 135–145)

## 2023-03-17 LAB — LIPID PANEL
Cholesterol: 123 mg/dL (ref 0–200)
HDL: 44 mg/dL (ref >40)
LDL Cholesterol: 45 mg/dL (ref 0–99)
Total CHOL/HDL Ratio: 2.8 RATIO
Triglycerides: 172 mg/dL — ABNORMAL HIGH (ref <150)
VLDL: 34 mg/dL (ref 0–40)

## 2023-03-17 LAB — GLUCOSE, CAPILLARY
Glucose-Capillary: 140 mg/dL — ABNORMAL HIGH (ref 70–99)
Glucose-Capillary: 202 mg/dL — ABNORMAL HIGH (ref 70–99)
Glucose-Capillary: 184 mg/dL — ABNORMAL HIGH (ref 70–99)
Glucose-Capillary: 226 mg/dL — ABNORMAL HIGH (ref 70–99)

## 2023-03-17 LAB — CBC
HCT: 35.1 % — ABNORMAL LOW (ref 39.0–52.0)
Hemoglobin: 12.1 g/dL — ABNORMAL LOW (ref 13.0–17.0)
MCH: 31.8 pg (ref 26.0–34.0)
MCHC: 34.5 g/dL (ref 30.0–36.0)
MCV: 92.1 fL (ref 80.0–100.0)
Platelets: 144 10*3/uL — ABNORMAL LOW (ref 150–400)
RBC: 3.81 MIL/uL — ABNORMAL LOW (ref 4.22–5.81)
RDW: 13 % (ref 11.5–15.5)
WBC: 5.6 10*3/uL (ref 4.0–10.5)
nRBC: 0 % (ref 0.0–0.2)

## 2023-03-17 LAB — ECHOCARDIOGRAM COMPLETE BUBBLE STUDY
Area-P 1/2: 2.7 cm2
S' Lateral: 2.9 cm

## 2023-03-17 LAB — HEMOGLOBIN A1C
Hgb A1c MFr Bld: 7 % — ABNORMAL HIGH (ref 4.8–5.6)
Mean Plasma Glucose: 154.2 mg/dL

## 2023-03-17 LAB — SEDIMENTATION RATE: Sed Rate: 4 mm/hr (ref 0–16)

## 2023-03-17 LAB — C-REACTIVE PROTEIN: CRP: 0.5 mg/dL (ref <1.0)

## 2023-03-17 LAB — MAGNESIUM
Magnesium: 1.9 mg/dL (ref 1.7–2.4)
Magnesium: 2.4 mg/dL (ref 1.7–2.4)

## 2023-03-17 MED ORDER — ASPIRIN 81 MG PO CHEW
81.0000 mg | CHEWABLE_TABLET | Freq: Every day | ORAL | Status: DC
  Administered 2023-03-17 – 2023-03-18 (×2): 81 mg via ORAL
  Filled 2023-03-17 (×2): qty 1

## 2023-03-17 MED ORDER — MAGNESIUM SULFATE 2 GM/50ML IV SOLN
2.0000 g | Freq: Once | INTRAVENOUS | Status: AC
  Administered 2023-03-17: 2 g via INTRAVENOUS
  Filled 2023-03-17: qty 50

## 2023-03-17 MED ORDER — DOFETILIDE 125 MCG PO CAPS
125.0000 ug | ORAL_CAPSULE | Freq: Two times a day (BID) | ORAL | Status: DC
  Administered 2023-03-17 – 2023-03-18 (×3): 125 ug via ORAL
  Filled 2023-03-17 (×5): qty 1

## 2023-03-17 MED ORDER — PANTOPRAZOLE SODIUM 40 MG PO TBEC
40.0000 mg | DELAYED_RELEASE_TABLET | Freq: Every day | ORAL | Status: DC
  Administered 2023-03-17 – 2023-03-18 (×2): 40 mg via ORAL
  Filled 2023-03-17 (×2): qty 1

## 2023-03-17 MED ORDER — POTASSIUM CHLORIDE CRYS ER 20 MEQ PO TBCR
40.0000 meq | EXTENDED_RELEASE_TABLET | Freq: Once | ORAL | Status: AC
  Administered 2023-03-17: 40 meq via ORAL
  Filled 2023-03-17: qty 2

## 2023-03-17 NOTE — Consult Note (Signed)
Cardiology Consultation:  Patient ID: Marvin Salinas MRN: [id number]; DOB: 11-04-1945  Admit date: 03/16/2023 Date of Consult: 03/17/2023  Primary Care Provider: Natalia Leatherwood, DO Primary Cardiologist: None  Primary Electrophysiologist:  None   Patient Profile:  Marvin Salinas is a 77 y.o. male with a hx of CAD status post CABG, PAD, paroxysmal A-fib, hypertension, carotid artery disease, hyperlipidemia, diabetes, myeloma, CKD IIIB who is being seen today for the evaluation of chest pain at the request of Marvin Raring, MD.  History of Present Illness:  [name] presents with an episode of dizziness.  Described as the room spinning around him with some blurriness in his left eye.  Symptoms lasted 20 to 30 seconds.  He also reports nonspecific chest discomfort.  He came to the emergency room for further evaluation.  Symptoms are concerning for vertigo versus TIA.  Labs notable for serum creatinine 1.77 with a EGFR 39.  This is stable from baseline.  LDL 45.  Hemoglobin 12.1.  High-sensitivity troponin 10 initially and 10 on repeat.  EKG showed sinus bradycardia with no acute ischemic changes.  Brain MRI did show chronic right PCA distribution infarct as well as remote bilateral cerebral infarcts and right basal ganglia infarcts.  He was noted to have moderate microvascular ischemic disease.  Left ICA stenosis noted to be around 60% and right ICA stenosis 50%.  No large vessel occlusion noted.  Chest x-ray normal.  Cardiology asked to evaluate regarding chest pain symptoms. He reports for the last 17 months he has had sharp chest pain. He reports it started with chemotherapy for his myeloma. He states it is constant most days. Worse with sitting forward and better with laying back. He also reports it is better with gabapentin. Troponin negative x 2 and EKG normal. He denies SOB or LE edema. CP appears to be not be new.   Heart Pathway Score:       Past Medical History: Past Medical History:   Diagnosis Date   Acute kidney injury superimposed on chronic kidney disease (HCC) 10/12/2017   Atrial fibrillation with RVR (HCC) 10/27/2017   Back pain    Basilar artery stenosis    on chronic Plavix   Benign prostatic hypertrophy without urinary obstruction 04/17/2014   Carotid artery disease (HCC) 10/06/2010   Carotid US 04/2019: Bilat ICA 40-59; L subclavian stenosis  // Carotid US 11/21: Bilat ICA 40-59; bilateral subclavian stenosis // Carotid US 11/22: Bilateral ICA 40-59; left subclavian stenosis // Carotid US 04/28/2022: Bilateral ICA 40-59; left subclavian stenosis   Cerebral vascular disease    with prior TIA's; followed by Dr. Sandria Manly   Depression, neurotic 11/21/2013   Diabetes mellitus    on insulin   Enthesopathy of ankle and tarsus 09/04/2007   Overview:  Metatarsalgia    Enthesopathy of ankle and tarsus 09/04/2007   Overview:  Metatarsalgia  Formatting of this note might be different from the original. Metatarsalgia  10/1 IMO update   History of renal calculi    Hyperlipidemia    Hypertension    Ischemic heart disease    prior PCI to RCA in 1989. S/P PCI to LAD and OM in 1992. S/P PCI to first DX in 2000. S/P CABG x 3 in May 2011   Lambda light chain myeloma (HCC) 10/28/2021   Left hip pain 01/03/2020   OSA (obstructive sleep apnea) 01/11/2018   AHI 18.1 and SaO2 low 73%   PAD (peripheral artery disease) (HCC) 11/18/2017   ABIs/Arterial US  04/2019: R 1.21; L 0.66 // R SFA 30-49, stable > 50 CIA and EIA stenosis; L > 50 CIA stenosis (likely represents severe stenosis or short segment occlusion)   Peripheral neuropathy    Pneumonia 02/2011   Sacroiliac joint dysfunction of left side 01/03/2020   Stroke (HCC) 04/16/2001   small right cerebellar infarct on 04/16/2001 at that time he was found to have proximal left vertebral artery, proximal left common carotid artery and both external carotid artery stenosis as well as intracranial stenosis involving mid basilar artery-  07/2011 add questionable TIA    Past Surgical History: Past Surgical History:  Procedure Laterality Date    NASAL ENDOSCOPY  01/11/2020   chronic rhinitis w/o evidence of acute sinusitis, bilateral inferior turbinate hypertrophy   ABDOMINAL AORTOGRAM W/LOWER EXTREMITY Bilateral 05/30/2019   Procedure: ABDOMINAL AORTOGRAM W/LOWER EXTREMITY;  Surgeon: Iran Ouch, MD;  Location: [facility];  Service: Cardiovascular;  Laterality: Bilateral;   ANGIOPLASTY  1989   right coronary artery   ANGIOPLASTY  1992   LAD and OM   ANGIOPLASTY  1998   First DX   BRAIN SURGERY     on prior records   CORONARY ARTERY BYPASS GRAFT  11/12/2009   LIMA to LAD, SVG to OM and SVG to RCA   CORONARY STENT PLACEMENT  2000   Stent to LAD/Circumflex with angioplasty to first diagonal    LEFT HEART CATH AND CORS/GRAFTS ANGIOGRAPHY N/A 10/13/2017   Procedure: LEFT HEART CATH AND CORS/GRAFTS ANGIOGRAPHY;  Surgeon: Yvonne Kendall, MD;  Location: [facility];  Service: Cardiovascular;  Laterality: N/A;    Inpatient Medications: Scheduled Meds:  apixaban  5 mg Oral BID   aspirin  81 mg Oral Daily   dofetilide  125 mcg Oral BID   DULoxetine  30 mg Oral Daily   ezetimibe  10 mg Oral Daily   gabapentin  300 mg Oral TID   insulin aspart  0-15 Units Subcutaneous TID WC   insulin glargine-yfgn  15 Units Subcutaneous BID   metoprolol tartrate  12.5 mg Oral BID   pantoprazole  40 mg Oral Daily   rosuvastatin  20 mg Oral QHS   Continuous Infusions:  magnesium sulfate bolus IVPB 2 g (03/17/23 1315)   PRN Meds: acetaminophen **OR** acetaminophen (TYLENOL) oral liquid 160 mg/5 mL **OR** acetaminophen, senna-docusate  Allergies:    Allergies  Allergen Reactions   Codeine Nausea And Vomiting   Lisinopril Cough   Nsaids Other (See Comments)    CKD   Dapagliflozin Rash   Latex Hives    Social History:   Social History   Socioeconomic History   Marital status: Divorced    Spouse name: Not  on file   Number of children: 0   Years of education: GED   Highest education level: Some college, no degree  Occupational History   Occupation: retired  Tobacco Use   Smoking status: Former    Current packs/day: 0.00    Average packs/day: 0.3 packs/day for 54.0 years (13.5 ttl pk-yrs)    Types: Cigarettes    Start date: 06/29/1968    Quit date: 06/29/2022    Years since quitting: 0.7   Smokeless tobacco: Never  Vaping Use   Vaping status: Never Used  Substance and Sexual Activity   Alcohol use: Never   Drug use: No   Sexual activity: Not Currently    Partners: Female  Other Topics Concern   Not on file  Social History Narrative  Marital status/children/pets: divorced.   Education/employment: retired, Patient has his GED. 9th grade education.    Safety:      -smoke alarm in the home:Yes     - wears seatbelt: Yes   Patient is right handed.   Patient does not drink any caffeine.   Lives in a one story home    Social Determinants of Health   Financial Resource Strain: Medium Risk (06/26/2022)   Overall Financial Resource Strain (CARDIA)    Difficulty of Paying Living Expenses: Somewhat hard  Food Insecurity: No Food Insecurity (06/26/2022)   Hunger Vital Sign    Worried About Running Out of Food in the Last Year: Never true    Ran Out of Food in the Last Year: Never true  Transportation Needs: No Transportation Needs (06/26/2022)   PRAPARE - Administrator, Civil Service (Medical): No    Lack of Transportation (Non-Medical): No  Physical Activity: Unknown (06/26/2022)   Exercise Vital Sign    Days of Exercise per Week: 1 day    Minutes of Exercise per Session: Patient declined  Stress: No Stress Concern Present (06/26/2022)   [employer] of Occupational Health - Occupational Stress Questionnaire    Feeling of Stress : Only a little  Social Connections: Socially Isolated (06/26/2022)   Social Connection and Isolation Panel [NHANES]    Frequency of Communication  with Friends and Family: Three times a week    Frequency of Social Gatherings with Friends and Family: Patient declined    Attends Religious Services: Never    Database administrator or Organizations: No    Attends Banker Meetings: Never    Marital Status: Divorced  Catering manager Violence: Not At Risk (03/19/2022)   Humiliation, Afraid, Rape, and Kick questionnaire    Fear of Current or Ex-Partner: No    Emotionally Abused: No    Physically Abused: No    Sexually Abused: No     Family History:   Family History  Problem Relation Age of Onset   Depression Mother    Early death Mother    Kidney disease Mother    Ovarian cancer Mother    Hypertension Father    Heart disease Father    Heart attack Father    Asthma Brother    Diabetes Brother    Stroke Other        Uncle   Diabetes Sister    Colon cancer Neg Hx    Rectal cancer Neg Hx    Stomach cancer Neg Hx    Esophageal cancer Neg Hx      ROS:  All other ROS reviewed and negative. Pertinent positives noted in the HPI.     Physical Exam/Data:   Vitals:   03/17/23 0400 03/17/23 0800 03/17/23 1112 03/17/23 1200  BP: 130/75 138/74 130/73   Pulse: 75  66   Resp: 14     Temp:  (!) 97.3 F (36.3 C)  (!) 97.4 F (36.3 C)  TempSrc:  Oral  Oral  SpO2: 95%     Weight:      Height:        Intake/Output Summary (Last 24 hours) at 03/17/2023 1321 Last data filed at 03/17/2023 1122 Gross per 24 hour  Intake --  Output 350 ml  Net -350 ml       03/16/2023    1:52 PM 02/25/2023    9:48 AM 01/28/2023    9:59 AM  Last 3 Weights  Weight (lbs)  156 lb 154 lb 12.8 oz 162 lb  Weight (kg) 70.761 kg 70.217 kg 73.483 kg    Body mass index is 23.72 kg/m.   General: Well nourished, well developed, in no acute distress Head: Atraumatic, normal size  Eyes: PEERLA, EOMI  Neck: Supple, no JVD Endocrine: No thryomegaly Cardiac: Normal S1, S2; RRR; no murmurs, rubs, or gallops Lungs: Clear to auscultation bilaterally,  no wheezing, rhonchi or rales  Abd: Soft, nontender, no hepatomegaly  Ext: No edema, pulses 2+ Musculoskeletal: No deformities, BUE and BLE strength normal and equal Skin: Warm and dry, no rashes   Neuro: Alert and oriented to person, place, time, and situation, CNII-XII grossly intact, no focal deficits  Psych: Normal mood and affect   EKG:  The EKG was personally reviewed and demonstrates:  SB 58 bpm, no acute changes  Telemetry:  Telemetry was personally reviewed and demonstrates:  SB 50s  Relevant CV Studies: Brain MRI 03/16/2023 IMPRESSION: 1. No acute intracranial abnormality. 2. Chronic right PCA distribution infarct, with multiple additional remote bilateral cerebellar and right basal ganglia infarcts as above. 3. Underlying age-related cerebral atrophy with moderate chronic microvascular ischemic disease.  Laboratory Data: High Sensitivity Troponin:   Recent Labs  Lab 03/16/23 1302 03/16/23 1459  TROPONINIHS 10 10     Cardiac EnzymesNo results for input(s): "TROPONINI" in the last 168 hours. No results for input(s): "TROPIPOC" in the last 168 hours.  Chemistry Recent Labs  Lab 03/16/23 1302 03/17/23 0130 03/17/23 0630  NA 139 139 139  K 4.5 3.8 4.6  CL 105 101 105  CO2 30 25 26   GLUCOSE 202* 179* 137*  BUN 21 21 23   CREATININE 1.54* 1.80* 1.77*  CALCIUM 9.2 9.3 9.2  GFRNONAA 46* 38* 39*  ANIONGAP 4* 13 8    Recent Labs  Lab 03/17/23 0130  PROT 5.7*  ALBUMIN 3.8  AST 31  ALT 36  ALKPHOS 46  BILITOT 0.7   Hematology Recent Labs  Lab 03/16/23 1302 03/17/23 0130  WBC 5.0 5.6  RBC 3.76* 3.81*  HGB 12.1* 12.1*  HCT 35.3* 35.1*  MCV 93.9 92.1  MCH 32.2 31.8  MCHC 34.3 34.5  RDW 12.9 13.0  PLT 149* 144*   BNPNo results for input(s): "BNP", "PROBNP" in the last 168 hours.  DDimer No results for input(s): "DDIMER" in the last 168 hours.  Radiology/Studies:  MR BRAIN WO CONTRAST  Result Date: 03/16/2023 CLINICAL DATA:  Initial evaluation for  acute TIA. EXAM: MRI HEAD WITHOUT CONTRAST TECHNIQUE: Multiplanar, multiecho pulse sequences of the brain and surrounding structures were obtained without intravenous contrast. COMPARISON:  Prior study from earlier the same day. FINDINGS: Brain: Generalized age-related cerebral atrophy. Patchy and confluent T2/FLAIR hyperintensity involving the periventricular deep white matter both cerebral hemispheres, most likely related to chronic microvascular ischemic disease, moderately advanced in nature. Encephalomalacia and gliosis involving the right occipital lobe, consistent with a chronic right PCA distribution infarct. Multiple scatter remote bilateral cerebellar infarcts. Remote lacunar infarct present at the right basal ganglia. No abnormal foci of restricted diffusion to suggest acute or subacute ischemia. Note made of an apparent focus of diffusion signal abnormality along the ventral aspect of the medulla along the midline (series 2, image 12), not seen on corresponding sequences, and favored to be artifactual. Gray-white matter differentiation maintained. No acute intracranial hemorrhage. Few scattered chronic micro hemorrhages noted, most notably about the cerebellum, likely related to chronic hypertension. No mass lesion, midline shift or mass effect. No hydrocephalus or extra-axial  fluid collection. Pituitary gland suprasellar region within normal limits. Vascular: Major intracranial vascular flow voids are maintained. Skull and upper cervical spine: Craniocervical junction with normal limits. Bone marrow signal intensity normal. No scalp soft tissue abnormality. Sinuses/Orbits: Globes and orbital soft tissues within normal limits. Paranasal sinuses are largely clear. Trace bilateral mastoid effusions, of doubtful significance. Other: None. IMPRESSION: 1. No acute intracranial abnormality. 2. Chronic right PCA distribution infarct, with multiple additional remote bilateral cerebellar and right basal ganglia  infarcts as above. 3. Underlying age-related cerebral atrophy with moderate chronic microvascular ischemic disease. Electronically Signed   By: Rise Mu M.D.   On: 03/16/2023 21:25   CT ANGIO HEAD NECK W WO CM  Result Date: 03/16/2023 CLINICAL DATA:  Neuro deficit, acute, stroke suspected. Transient bilateral vision loss. EXAM: CT ANGIOGRAPHY HEAD AND NECK WITH AND WITHOUT CONTRAST TECHNIQUE: Multidetector CT imaging of the head and neck was performed using the standard protocol during bolus administration of intravenous contrast. Multiplanar CT image reconstructions and MIPs were obtained to evaluate the vascular anatomy. Carotid stenosis measurements (when applicable) are obtained utilizing NASCET criteria, using the distal internal carotid diameter as the denominator. RADIATION DOSE REDUCTION: This exam was performed according to the departmental dose-optimization program which includes automated exposure control, adjustment of the mA and/or kV according to patient size and/or use of iterative reconstruction technique. CONTRAST:  75mL OMNIPAQUE IOHEXOL 350 MG/ML SOLN COMPARISON:  MRI of the brain September 18, 2021. FINDINGS: CT HEAD FINDINGS Brain: No evidence of acute infarction, hemorrhage, hydrocephalus, extra-axial collection or mass lesion/mass effect. Chronic infarct in the right parietooccipital region. Small remote infarcts are also seen in the left cerebellar hemisphere. Vascular: Calcified plaques in the bilateral carotid siphons. Skull: Normal. Negative for fracture or focal lesion. Sinuses/Orbits: No acute finding. Other: None. Review of the MIP images confirms the above findings CTA NECK FINDINGS Aortic arch: Standard branching. Imaged portion shows no evidence of aneurysm or dissection. Calcified plaques are seen in the aortic arch extending to the origin of the major neck arteries. No significant stenosis at the innominate and left subclavian artery origins. Left common carotid artery  origin is obscured by dense contrast in the left brachiocephalic vein. Right carotid system: Atherosclerotic changes with mixed density plaque in the proximal right common carotid artery without significant stenosis. Atherosclerotic changes of the right carotid bifurcation with mixed density plaques resulting in approximately 50% stenosis. Left carotid system: The origin and proximal segments of the left common carotid arteries are obscured by dense contrast on adjacent vein. Mild atherosclerotic changes along the left common carotid artery without hemodynamically significant stenosis. Atherosclerotic changes of the left carotid bifurcation resulting approximately 60% stenosis. Vertebral arteries: Atherosclerotic changes at the origin of the right vertebral artery with severe stenosis. There is also moderate stenosis of the right vertebral artery at the V3-V4 junction. Atherosclerotic changes are also seen at the origin of the dominant left vertebral artery resulting in severe stenosis. There is also at least moderate stenosis of the left vertebral artery at the C5-6 level secondary to spondylosis. Skeleton: Degenerative changes of the cervical spine. Postsurgical changes from prior sternotomy. Other neck: Negative. Upper chest: Negative. Review of the MIP images confirms the above findings CTA HEAD FINDINGS Anterior circulation: Calcified plaques in the bilateral carotid siphons with moderate stenosis at the paraclinoid segment on the right. The bilateral MCA and ACA vascular trees are maintained without high-grade stenosis, aneurysm or other significant vascular abnormality. Posterior circulation: Atherosclerotic changes of the intracranial right vertebral  artery with severe stenosis at the vertebrobasilar junction. Atherosclerotic changes of the intracranial left vertebral artery with mild stenosis. Severe atherosclerotic changes of the basilar artery with severe mid/proximal stenosis. Mild stenosis at the origin  of the right posterior cerebral artery. The left posterior cerebral artery originates from the left ICA and is maintained. Venous sinuses: As permitted by contrast timing, patent. Anatomic variants: Fetal left PCA. Review of the MIP images confirms the above findings IMPRESSION: 1. No acute intracranial abnormality. 2. Chronic infarct in the right parietooccipital region. Small remote infarcts in the left cerebellar hemisphere. 3. No intracranial large vessel occlusion. 4. Atherosclerotic changes of the basilar artery with severe mid/proximal stenosis. 5. Atherosclerotic changes of the bilateral carotid siphons with moderate stenosis at the paraclinoid segment on the right. 6. Atherosclerotic changes of the bilateral vertebral arteries with severe stenosis their origin. 7. Atherosclerotic changes of the left carotid bifurcation resulting in approximately 60% stenosis. 8. Atherosclerotic changes of the right carotid bifurcation resulting approximately 50% stenosis. 9. Aortic atherosclerosis. Aortic Atherosclerosis (ICD10-I70.0). Electronically Signed   By: Baldemar Lenis M.D.   On: 03/16/2023 15:35   DG Chest 2 View  Result Date: 03/16/2023 CLINICAL DATA:  Chest pain. EXAM: CHEST - 2 VIEW COMPARISON:  08/12/2022. FINDINGS: Bilateral lung fields are clear. Bilateral costophrenic angles are clear. Normal cardio-mediastinal silhouette. Sternotomy wires noted, status post CABG. Aortic arch calcifications seen. No acute osseous abnormalities. The soft tissues are within normal limits. IMPRESSION: No acute cardiopulmonary process. Electronically Signed   By: Jules Schick M.D.   On: 03/16/2023 15:18    Assessment and Plan:   # Chest pain, non-cardiac -reports sharp CP in his chest since starting chemo. Better with gabapentin and position change. EKG is non-ischemic and troponin negative x 2. Does not appear to be cardiac despite history of CABG/PAD. Here for vertigo versus TIA. Management per  primary team. Would continue current cardiac medications. Nothing sounds cardiac from discussion with the patient.   # CAD s/p CABG -home meds  # Atrial flutter -on Tikosyn/eliquis   # OSA -CPAP  # PAD -no change to medications. No PAD symptoms.   [facility] will sign off.   Medication Recommendations:  As above Other recommendations (labs, testing, etc):  None Follow up as an outpatient:  Regular follow-up with Dr. Kirke Corin   For questions or updates, please contact [facility] Please consult [email or web address] for contact info under    Signed, Gerri Spore T. Flora Lipps, MD, [facility]  [facility]  03/17/2023 1:21 PM

## 2023-03-17 NOTE — Progress Notes (Addendum)
STROKE TEAM PROGRESS NOTE   BRIEF HPI [name] is a 77 y.o. male with history of hypertension, hyperlipidemia, CVA (2002) with unsuccessful basilar artery stenting, TIA (2013), PAD status post left common iliac stenting, CAD status post CABG, A-flutter, diabetes, CKD 3, OSA, and a ~66-month history of permanent left lateral vision impairment who presents for a 30-second episode of vertigo and left-sided blurry vision, now resolved.  Found to have a new but chronic appearing right PCA stroke from prior MRI (08/2021).  CTA head and neck showed severe basilar stenosis, severe vertebral artery stenosis, and borderline carotid artery disease.  Symptomatology consistent with vertebrobasilar insufficiency in setting of stenosis patient is adherent with home Eliquis 5 twice daily, Crestor 20, ezetimibe 10.  SIGNIFICANT HOSPITAL EVENTS 9/25: Presents with left-sided vision and vertigo.  Labs unremarkable.  INTERIM HISTORY/SUBJECTIVE Patient says that he has had lower blood pressures over past 2 weeks.  Continues to have pretty severe basilar artery stenosis.  Adherent to medications.  Endorses history as above.  Vision testing revealed lower left quadrant anopsia.  Appears at neurological baseline.  Goal blood pressures: 130-150. Antiplatelet therapy had been withheld in the past due to GI bleeding 3 or 4 years ago. Discharge on Eliquis/aspirin with outpatient followup with neuro IR.   OBJECTIVE  CBC    Component Value Date/Time   WBC 5.6 03/17/2023 0130   RBC 3.81 (L) 03/17/2023 0130   HGB 12.1 (L) 03/17/2023 0130   HGB 12.0 (L) 02/25/2023 0921   HGB 13.4 05/22/2019 0912   HCT 35.1 (L) 03/17/2023 0130   HCT 40.2 05/22/2019 0912   PLT 144 (L) 03/17/2023 0130   PLT 156 02/25/2023 0921   PLT 307 05/22/2019 0912   MCV 92.1 03/17/2023 0130   MCV 91 05/22/2019 0912   MCH 31.8 03/17/2023 0130   MCHC 34.5 03/17/2023 0130   RDW 13.0 03/17/2023 0130   RDW 13.3 05/22/2019 0912   LYMPHSABS 1.1  02/25/2023 0921   MONOABS 0.6 02/25/2023 0921   EOSABS 0.1 02/25/2023 0921   BASOSABS 0.0 02/25/2023 0921    BMET    Component Value Date/Time   NA 139 03/17/2023 0630   NA 140 08/31/2021 1026   K 4.6 03/17/2023 0630   CL 105 03/17/2023 0630   CO2 26 03/17/2023 0630   GLUCOSE 137 (H) 03/17/2023 0630   BUN 23 03/17/2023 0630   BUN 21 08/31/2021 1026   CREATININE 1.77 (H) 03/17/2023 0630   CREATININE 1.66 (H) 02/25/2023 0921   CALCIUM 9.2 03/17/2023 0630   EGFR 53 (L) 08/31/2021 1026   GFRNONAA 39 (L) 03/17/2023 0630   GFRNONAA 42 (L) 02/25/2023 0921    IMAGING past 24 hours MR BRAIN WO CONTRAST  Result Date: 03/16/2023 CLINICAL DATA:  Initial evaluation for acute TIA. EXAM: MRI HEAD WITHOUT CONTRAST TECHNIQUE: Multiplanar, multiecho pulse sequences of the brain and surrounding structures were obtained without intravenous contrast. COMPARISON:  Prior study from earlier the same day. FINDINGS: Brain: Generalized age-related cerebral atrophy. Patchy and confluent T2/FLAIR hyperintensity involving the periventricular deep white matter both cerebral hemispheres, most likely related to chronic microvascular ischemic disease, moderately advanced in nature. Encephalomalacia and gliosis involving the right occipital lobe, consistent with a chronic right PCA distribution infarct. Multiple scatter remote bilateral cerebellar infarcts. Remote lacunar infarct present at the right basal ganglia. No abnormal foci of restricted diffusion to suggest acute or subacute ischemia. Note made of an apparent focus of diffusion signal abnormality along the ventral aspect of  the medulla along the midline (series 2, image 12), not seen on corresponding sequences, and favored to be artifactual. Gray-white matter differentiation maintained. No acute intracranial hemorrhage. Few scattered chronic micro hemorrhages noted, most notably about the cerebellum, likely related to chronic hypertension. No mass lesion, midline  shift or mass effect. No hydrocephalus or extra-axial fluid collection. Pituitary gland suprasellar region within normal limits. Vascular: Major intracranial vascular flow voids are maintained. Skull and upper cervical spine: Craniocervical junction with normal limits. Bone marrow signal intensity normal. No scalp soft tissue abnormality. Sinuses/Orbits: Globes and orbital soft tissues within normal limits. Paranasal sinuses are largely clear. Trace bilateral mastoid effusions, of doubtful significance. Other: None. IMPRESSION: 1. No acute intracranial abnormality. 2. Chronic right PCA distribution infarct, with multiple additional remote bilateral cerebellar and right basal ganglia infarcts as above. 3. Underlying age-related cerebral atrophy with moderate chronic microvascular ischemic disease. Electronically Signed   By: Rise Mu M.D.   On: 03/16/2023 21:25   CT ANGIO HEAD NECK W WO CM  Result Date: 03/16/2023 CLINICAL DATA:  Neuro deficit, acute, stroke suspected. Transient bilateral vision loss. EXAM: CT ANGIOGRAPHY HEAD AND NECK WITH AND WITHOUT CONTRAST TECHNIQUE: Multidetector CT imaging of the head and neck was performed using the standard protocol during bolus administration of intravenous contrast. Multiplanar CT image reconstructions and MIPs were obtained to evaluate the vascular anatomy. Carotid stenosis measurements (when applicable) are obtained utilizing NASCET criteria, using the distal internal carotid diameter as the denominator. RADIATION DOSE REDUCTION: This exam was performed according to the departmental dose-optimization program which includes automated exposure control, adjustment of the mA and/or kV according to patient size and/or use of iterative reconstruction technique. CONTRAST:  75mL OMNIPAQUE IOHEXOL 350 MG/ML SOLN COMPARISON:  MRI of the brain September 18, 2021. FINDINGS: CT HEAD FINDINGS Brain: No evidence of acute infarction, hemorrhage, hydrocephalus, extra-axial  collection or mass lesion/mass effect. Chronic infarct in the right parietooccipital region. Small remote infarcts are also seen in the left cerebellar hemisphere. Vascular: Calcified plaques in the bilateral carotid siphons. Skull: Normal. Negative for fracture or focal lesion. Sinuses/Orbits: No acute finding. Other: None. Review of the MIP images confirms the above findings CTA NECK FINDINGS Aortic arch: Standard branching. Imaged portion shows no evidence of aneurysm or dissection. Calcified plaques are seen in the aortic arch extending to the origin of the major neck arteries. No significant stenosis at the innominate and left subclavian artery origins. Left common carotid artery origin is obscured by dense contrast in the left brachiocephalic vein. Right carotid system: Atherosclerotic changes with mixed density plaque in the proximal right common carotid artery without significant stenosis. Atherosclerotic changes of the right carotid bifurcation with mixed density plaques resulting in approximately 50% stenosis. Left carotid system: The origin and proximal segments of the left common carotid arteries are obscured by dense contrast on adjacent vein. Mild atherosclerotic changes along the left common carotid artery without hemodynamically significant stenosis. Atherosclerotic changes of the left carotid bifurcation resulting approximately 60% stenosis. Vertebral arteries: Atherosclerotic changes at the origin of the right vertebral artery with severe stenosis. There is also moderate stenosis of the right vertebral artery at the V3-V4 junction. Atherosclerotic changes are also seen at the origin of the dominant left vertebral artery resulting in severe stenosis. There is also at least moderate stenosis of the left vertebral artery at the C5-6 level secondary to spondylosis. Skeleton: Degenerative changes of the cervical spine. Postsurgical changes from prior sternotomy. Other neck: Negative. Upper chest:  Negative. Review of  the MIP images confirms the above findings CTA HEAD FINDINGS Anterior circulation: Calcified plaques in the bilateral carotid siphons with moderate stenosis at the paraclinoid segment on the right. The bilateral MCA and ACA vascular trees are maintained without high-grade stenosis, aneurysm or other significant vascular abnormality. Posterior circulation: Atherosclerotic changes of the intracranial right vertebral artery with severe stenosis at the vertebrobasilar junction. Atherosclerotic changes of the intracranial left vertebral artery with mild stenosis. Severe atherosclerotic changes of the basilar artery with severe mid/proximal stenosis. Mild stenosis at the origin of the right posterior cerebral artery. The left posterior cerebral artery originates from the left ICA and is maintained. Venous sinuses: As permitted by contrast timing, patent. Anatomic variants: Fetal left PCA. Review of the MIP images confirms the above findings IMPRESSION: 1. No acute intracranial abnormality. 2. Chronic infarct in the right parietooccipital region. Small remote infarcts in the left cerebellar hemisphere. 3. No intracranial large vessel occlusion. 4. Atherosclerotic changes of the basilar artery with severe mid/proximal stenosis. 5. Atherosclerotic changes of the bilateral carotid siphons with moderate stenosis at the paraclinoid segment on the right. 6. Atherosclerotic changes of the bilateral vertebral arteries with severe stenosis their origin. 7. Atherosclerotic changes of the left carotid bifurcation resulting in approximately 60% stenosis. 8. Atherosclerotic changes of the right carotid bifurcation resulting approximately 50% stenosis. 9. Aortic atherosclerosis. Aortic Atherosclerosis (ICD10-I70.0). Electronically Signed   By: Baldemar Lenis M.D.   On: 03/16/2023 15:35   DG Chest 2 View  Result Date: 03/16/2023 CLINICAL DATA:  Chest pain. EXAM: CHEST - 2 VIEW COMPARISON:   08/12/2022. FINDINGS: Bilateral lung fields are clear. Bilateral costophrenic angles are clear. Normal cardio-mediastinal silhouette. Sternotomy wires noted, status post CABG. Aortic arch calcifications seen. No acute osseous abnormalities. The soft tissues are within normal limits. IMPRESSION: No acute cardiopulmonary process. Electronically Signed   By: Jules Schick M.D.   On: 03/16/2023 15:18    Vitals:   03/17/23 0400 03/17/23 0800 03/17/23 1112 03/17/23 1200  BP: 130/75 138/74 130/73   Pulse: 75  66   Resp: 14     Temp:  (!) 97.3 F (36.3 C)  (!) 97.4 F (36.3 C)  TempSrc:  Oral  Oral  SpO2: 95%     Weight:      Height:         PHYSICAL EXAM General:  Alert, well-nourished, well-developed patient in no acute distress Psych:  Mood and affect appropriate for situation CV: Regular rate and rhythm on monitor Respiratory:  Regular, unlabored respirations on room air GI: Abdomen soft and nontender   NEURO:  Mental Status: AA&Ox3, patient is able to give clear and coherent history Speech/Language: speech is without dysarthria or aphasia.  Naming, repetition, fluency, and comprehension intact.  Cranial Nerves:  II: PERRL.  Lower left quadrant anopsia III, IV, VI: EOMI. Eyelids elevate symmetrically.  V: Sensation is intact to light touch and symmetrical to face.  VII: Face is symmetrical resting and smiling VIII: hearing intact to voice. IX, X: Palate elevates symmetrically. Phonation is normal.  UX:[id number] shrug 5/5. XII: tongue is midline without fasciculations. Motor: 5/5 strength to all muscle groups tested.  Tone: is normal and bulk is normal Sensation- Intact to light touch bilaterally. Extinction absent to light touch to DSS.   Coordination: FTN intact bilaterally, HKS: no ataxia in BLE.No drift.  Gait- deferred   ASSESSMENT/PLAN  TIA in setting of severe, extensive posterior circulation atherosclerosis likely secondary to hypoperfusion  Per patient, blood  pressures have  been somewhat low over past 2 weeks.  With extent of widespread posterior circulation stenosis, these short episodes of visual deficits and vertigo likely secondary to hypoperfusion.  Have held home olmesartan at this point.  Previous attempted stenting basilar artery has evidently failed -- we will discharge on aspirin Eliquis, recommend to consult neuro IR outpatient.  Also recommend holding some of patient's home BP medications before discharge to ensure adequate perfusion.  Code Stroke CT head: No acute intracranial abnormality. CTA head & neck: No LVO.  Chronic right parietal occipital infarct.  Small remote infarcts in left cerebellum.  Atherosclerotic changes of: Basilar artery, severe; bilateral carotid siphons, moderate; bilateral vertebral arteries, severe; left carotid bifurcation 60%; right carotid bifurcation 50%. MRI: No acute intracranial abnormality.  Chronic right PCA distribution infarct.  Age-related cerebral atrophy. 2D Echo: Awaiting read LDL 45 HgbA1c 7.0 VTE prophylaxis -Eliquis 5 mg twice daily Eliquis 5 mg twice daily, now on aspirin 81 mg and Eliquis 5 twice daily. Therapy recommendations: OT: No follow-up.  Awaiting PT recs Disposition: Likely home  Hx of Stroke/TIA 03/2001: Right small cerebellar infarct -- found to have severe basilar artery stenosis, left vertebral artery stenosis, and bilateral carotid disease 07/2011: +/- TIA  Atrial flutter Home Meds: Eliquis 5 twice daily, metoprolol, Tikosyn, continue the hospital Continue telemetry monitoring  Hypertension Home meds: Olmesartan 5 mg, metoprolol titrated to 12.5 mg twice daily Unstable, on losartan held, metoprolol continued and setting of permissive hypertension Goal sBPs 130-1 50  Hyperlipidemia Home meds: Rosuvastatin 20, ezetimibe 10, resumed in hospital LDL 45, goal < 70 Continue statin at discharge  Diabetes type II Controlled Home meds: Semaglutide 14 mg p.o., HgbA1c 7.0, goal  < 7.0 CBGs SSI Recommend close follow-up with PCP for better DM control  Tobacco Abuse Former, 13.5 pack year   Dysphagia Patient has post-stroke dysphagia, SLP consulted    Diet   Diet regular Fluid consistency: Thin   Advance diet as tolerated  Other Stroke Risk Factors Hypertension Hyperlipidemia Previous stroke Peripheral artery disease Carotid artery disease Coronary artery disease status post CABG Atrial arrhythmia Type 2 diabetes Chronic kidney disease 3 Obstructive sleep apnea   Other Active Problems CKD 3: Slightly up trending to creatinine of 1.7 from 1.5 (baseline) OSA: CPAP  Hospital day # 0 I have personally obtained history,examined this patient, reviewed notes, independently viewed imaging studies, participated in medical decision making and plan of care.ROS completed by me personally and pertinent positives fully documented  I have made any additions or clarifications directly to the above note. Agree with note above.  Patient presented with transient vision disturbance on the left and has prior history of right occipital infarct due to severe symptomatic basilar stenosis in 2013 status post failed attempted angioplasty stenting.  Patient also has A-fib and is on anticoagulation.  Recommend continue Eliquis and add daily aspirin and referral for outpatient evaluation for reconsideration for basilar angioplasty stenting with neurointerventional radiology.  Maintain aggressive risk factor modification.  Long discussion with patient and Dr. Thedore Mins and answered questions.  Greater than 50% time during this 50-minute visit was spent in counseling and coordination of care about his symptomatic basilar stenosis as well as atrial fibrillation and discussion about risk-benefit of treatment and need for aggressive risk reduction and answered questions  Delia Heady, MD Medical Director [facility] Pager: [phone] 03/17/2023 4:42 PM    To contact  Stroke Continuity provider, please refer to [email or web address]. After hours, contact General Neurology

## 2023-03-17 NOTE — Plan of Care (Signed)
Problem: Education: Goal: Knowledge of disease or condition will improve Outcome: Progressing Goal: Knowledge of secondary prevention will improve (MUST DOCUMENT ALL) Outcome: Progressing Goal: Knowledge of patient specific risk factors will improve Loraine Leriche N/A or DELETE if not current risk factor) Outcome: Progressing   Problem: Ischemic Stroke/TIA Tissue Perfusion: Goal: Complications of ischemic stroke/TIA will be minimized Outcome: Progressing   Problem: Coping: Goal: Will verbalize positive feelings about self Outcome: Progressing Goal: Will identify appropriate support needs Outcome: Progressing   Problem: Health Behavior/Discharge Planning: Goal: Ability to manage health-related needs will improve Outcome: Progressing Goal: Goals will be collaboratively established with patient/family Outcome: Progressing   Problem: Self-Care: Goal: Ability to participate in self-care as condition permits will improve Outcome: Progressing Goal: Verbalization of feelings and concerns over difficulty with self-care will improve Outcome: Progressing Goal: Ability to communicate needs accurately will improve Outcome: Progressing   Problem: Nutrition: Goal: Risk of aspiration will decrease Outcome: Progressing Goal: Dietary intake will improve Outcome: Progressing   Problem: Education: Goal: Ability to describe self-care measures that may prevent or decrease complications (Diabetes Survival Skills Education) will improve Outcome: Progressing Goal: Individualized Educational Video(s) Outcome: Progressing   Problem: Coping: Goal: Ability to adjust to condition or change in health will improve Outcome: Progressing   Problem: Fluid Volume: Goal: Ability to maintain a balanced intake and output will improve Outcome: Progressing   Problem: Health Behavior/Discharge Planning: Goal: Ability to identify and utilize available resources and services will improve Outcome:  Progressing Goal: Ability to manage health-related needs will improve Outcome: Progressing   Problem: Metabolic: Goal: Ability to maintain appropriate glucose levels will improve Outcome: Progressing   Problem: Nutritional: Goal: Maintenance of adequate nutrition will improve Outcome: Progressing Goal: Progress toward achieving an optimal weight will improve Outcome: Progressing   Problem: Skin Integrity: Goal: Risk for impaired skin integrity will decrease Outcome: Progressing   Problem: Tissue Perfusion: Goal: Adequacy of tissue perfusion will improve Outcome: Progressing   Problem: Education: Goal: Knowledge of General Education information will improve Description: Including pain rating scale, medication(s)/side effects and non-pharmacologic comfort measures Outcome: Progressing   Problem: Health Behavior/Discharge Planning: Goal: Ability to manage health-related needs will improve Outcome: Progressing   Problem: Clinical Measurements: Goal: Ability to maintain clinical measurements within normal limits will improve Outcome: Progressing Goal: Will remain free from infection Outcome: Progressing Goal: Diagnostic test results will improve Outcome: Progressing Goal: Respiratory complications will improve Outcome: Progressing Goal: Cardiovascular complication will be avoided Outcome: Progressing   Problem: Activity: Goal: Risk for activity intolerance will decrease Outcome: Progressing   Problem: Nutrition: Goal: Adequate nutrition will be maintained Outcome: Progressing   Problem: Coping: Goal: Level of anxiety will decrease Outcome: Progressing   Problem: Elimination: Goal: Will not experience complications related to bowel motility Outcome: Progressing Goal: Will not experience complications related to urinary retention Outcome: Progressing   Problem: Pain Managment: Goal: General experience of comfort will improve Outcome: Progressing   Problem:  Safety: Goal: Ability to remain free from injury will improve Outcome: Progressing   Problem: Skin Integrity: Goal: Risk for impaired skin integrity will decrease Outcome: Progressing

## 2023-03-17 NOTE — Evaluation (Signed)
Occupational Therapy Evaluation Patient Details Name: Marvin Salinas MRN: [id number] DOB: 03-04-1946 Today's Date: 03/17/2023   History of Present Illness 77 y.o. male presenting 03/16/23 with Lt sided vision loss. Adm for ?TIA. Vision deficits resolved to baseline (lateral peripheral vision on left absent). Per neuro consult, suspect vertigo and vision loss due to vertebrobasilar insufficiency.  PMH significant of HTN, HLD, CVA, PAD status post stenting, CAD status post CABG, atrial flutter, diabetes, CKD 3, OSA, lambda light chain myeloma, depression, neuropathy in his feet and legs to mid-calf and bilateral hands   Clinical Impression   Pt admitted for above, now presents close to functional baseline with residual L hemianopsia PTA and ambulating mod I with SPC. Pt also completing ADLs, seated and standing, at Mod I level. Visual assessment performed and no other abnormalities noted besides baseline deficits. Pt has no further acute skilled OT needs. No follow-up OT recommended.       If plan is discharge home, recommend the following: Other (comment) (n/a)    Functional Status Assessment  Patient has not had a recent decline in their functional status  Equipment Recommendations  None recommended by OT    Recommendations for Other Services       Precautions / Restrictions Precautions Precautions: None Restrictions Weight Bearing Restrictions: No      Mobility Bed Mobility Overal bed mobility: Modified Independent                  Transfers Overall transfer level: Modified independent Equipment used: None                      Balance Overall balance assessment: Mild deficits observed, not formally tested                                         ADL either performed or assessed with clinical judgement   ADL Overall ADL's : Modified independent;At baseline                                       General ADL Comments: Pt  completing hall level ambulation and ADLs with Mod I.     Vision Baseline Vision/History: 1 Wears glasses (L hemianopsia at baseline) Patient Visual Report: No change from baseline Vision Assessment?: Yes Eye Alignment: Within Functional Limits Ocular Range of Motion: Within Functional Limits Alignment/Gaze Preference: Within Defined Limits Tracking/Visual Pursuits: Other (comment) (Eyes track smoothly bilaterally, residual L hemianopsia makes tracking laterally with L impaired) Visual Fields: Left homonymous hemianopsia (baseline impairment) Depth Perception:  (intact) Additional Comments: Pt reports vision is similar to current baseline since being on chemo     Perception         Praxis         Pertinent Vitals/Pain Pain Assessment Pain Assessment: Faces Faces Pain Scale: Hurts little more Pain Location: across chest Pain Descriptors / Indicators: Aching, Sharp Pain Intervention(s): Limited activity within patient's tolerance, Monitored during session, Repositioned     Extremity/Trunk Assessment Upper Extremity Assessment Upper Extremity Assessment: Overall WFL for tasks assessed   Lower Extremity Assessment Lower Extremity Assessment: Overall WFL for tasks assessed   Cervical / Trunk Assessment Cervical / Trunk Assessment: Normal   Communication Communication Communication: No apparent difficulties Cueing Techniques: Verbal cues   Cognition Arousal: Alert Behavior  During Therapy: WFL for tasks assessed/performed Overall Cognitive Status: Within Functional Limits for tasks assessed                                       General Comments  VSS on RA, HR up to 80s with functional mobility and rests in the 60s. Tele notes irregular heart beat but no other abonormalities noted    Exercises     Shoulder Instructions      Home Living Family/patient expects to be discharged to:: Private residence Living Arrangements: Alone Available Help at  Discharge: Friend(s);Family;Available PRN/intermittently Type of Home: House Home Access: Stairs to enter [employer] of Steps: 5 Entrance Stairs-Rails: Can reach both Home Layout: One level     Bathroom Shower/Tub: Producer, television/film/video: Standard     Home Equipment: [employer] - single point   Additional Comments: 1 fall in june/july after getting lightheaded from standing up too quickly      Prior Functioning/Environment Prior Level of Function : Independent/Modified Independent;Driving;History of Falls (last six months)             Mobility Comments: ind with SPC ADLs Comments: ind        OT Problem List: Impaired vision/perception      OT Treatment/Interventions:      OT Goals(Current goals can be found in the care plan section) Acute Rehab OT Goals Patient Stated Goal: To go home OT Goal Formulation: With patient Time For Goal Achievement: 03/31/23 Potential to Achieve Goals: Good  OT Frequency:      Co-evaluation              AM-PAC OT "6 Clicks" Daily Activity     Outcome Measure Help from another person eating meals?: None Help from another person taking care of personal grooming?: None Help from another person toileting, which includes using toliet, bedpan, or urinal?: None Help from another person bathing (including washing, rinsing, drying)?: None Help from another person to put on and taking off regular upper body clothing?: None Help from another person to put on and taking off regular lower body clothing?: None 6 Click Score: 24   End of Session Equipment Utilized During Treatment: Other (comment) (SPC) Nurse Communication: Mobility status (can ambulate independently using SPC)  Activity Tolerance: Patient tolerated treatment well Patient left: in bed;with call bell/phone within reach  OT Visit Diagnosis: Other (comment) (visual impairment)                Time: 6295-2841 OT Time Calculation (min): 24 min Charges:  OT  General Charges $OT Visit: 1 Visit OT Evaluation $OT Eval Low Complexity: 1 Low OT Treatments $Therapeutic Activity: 8-22 mins  03/17/2023  AB, OTR/L  Acute Rehabilitation Services  Office: [phone]   Tristan Schroeder 03/17/2023, 9:27 AM

## 2023-03-17 NOTE — Progress Notes (Signed)
PT Cancellation Note  Patient Details Name: THORSON MONCRIEFF MRN: [id number] DOB: [birth year]   Cancelled Treatment:    Reason Eval/Treat Not Completed: PT screened, no needs identified, will sign off  Patient seen by OT and is back to his baseline. No PT needs identified.    Jerolyn Center, PT Acute Rehabilitation Services  Office [phone]  Zena Amos 03/17/2023, 9:20 AM

## 2023-03-17 NOTE — Progress Notes (Signed)
PROGRESS NOTE                                                                                                                                                                                                             Patient Demographics:    Marvin Salinas, is a 77 y.o. male, DOB - 12-12-1945, YTK:[id number]  Outpatient Primary MD for the patient is Natalia Leatherwood, DO    LOS - 0  Admit date - 03/16/2023    Chief Complaint  Patient presents with   Chest Pain       Brief Narrative (HPI from H&P)   77 y.o. male with medical history significant of HTN, HLD, CVA, PAD status post stenting, carotid artery disease, CAD status post CABG, atrial flutter, diabetes, CKD 3, OSA, lambda light chain myeloma, depression who has history with left eye vision problems for several years presented to the hospital after he had an episode of dizziness where things were spinning around him with some blurriness in his left eye which was worse than his baseline about 2 times while he was having dizzy spells, blurriness episodes lasted about 20 to 30 seconds each.  Also having some nonspecific chest pain for several weeks, came to the ER for further help.   Subjective:    Ferman Hamming today has, No headache, No chest pain, No abdominal pain - No Nausea, No new weakness tingling or numbness, No SOB, currently no vision changes, ROS & history not very clear.   Assessment  & Plan :   Symptoms of vertigo, 2 episodes of blurry vision in the left eye with vertigo symptoms each blurriness episode lasting 20 seconds, this is acute on chronic as he has blurry vision in the left eye on a chronic basis as well. > Seen by neurology, he is a poor historian, for now he is already on max treatment with Eliquis and statin, LDL is stable, A1c is above goal, echo pending, PT OT and speech eval, further management per neurology.  CTA of the neck noted with moderate bilateral  ICA changes, at the very least will require outpatient vascular surgery follow-up.   Hypertension -Blood pressure stable on beta-blocker, low-dose ARB reintroduced   Hyperlipidemia - Continue home rosuvastatin and Zetia   PAD CAD Carotid artery disease > Known history of PAD status post left common  iliac stenting, CAD status post CABG, carotid artery disease described above.  On Eliquis beta-blocker and statin for secondary prevention, reporting history of intermittent chest pain for 2 months, does not sound cardiac, troponin negative, EKG nonacute, echo pending.  Will request his cardiology team to evaluate upon his request.  Chronic atrial flutter - Continue home Tikosyn, Eliquis, EKG morning of 03/17/2023 stable with stable QTc - Continue home metoprolol   OSA - CPAP QHS   CKD 3 > Creatinine stable in ED. - Trend renal function and electrolytes   Lambda light chain myeloma > Has been on chemotherapy until earlier this month when treatment has been held by his oncologist. - Continue to monitor   Depression - Continue home duloxetine   Diabetes > Prescribed 40 units long-acting insulin twice daily at home, but currently taking only 15 to 20 units for patient. -15 units twice daily long-acting insulin - SSI  CBG (last 3)  Recent Labs    03/16/23 2054 03/17/23 0758  GLUCAP 95 140*   Lab Results  Component Value Date   HGBA1C 7.0 (H) 03/17/2023        Condition - Extremely Guarded  Family Communication  :    Code Status :  Full  Consults  :  Neuro  PUD Prophylaxis :    Procedures  :     CTA 1. No acute intracranial abnormality. 2. Chronic infarct in the right parietooccipital region. Small remote infarcts in the left cerebellar hemisphere. 3. No intracranial large vessel occlusion. 4. Atherosclerotic changes of the basilar artery with severe mid/proximal stenosis. 5. Atherosclerotic changes of the bilateral carotid siphons with moderate stenosis at the  paraclinoid segment on the right. 6. Atherosclerotic changes of the bilateral vertebral arteries with severe stenosis their origin. 7. Atherosclerotic changes of the left carotid bifurcation resulting in approximately 60% stenosis. 8. Atherosclerotic changes of the right carotid bifurcation resulting approximately 50% stenosis. 9. Aortic atherosclerosis. Aortic Atherosclerosis (ICD10-I70.0)   MRI -  1. No acute intracranial abnormality. 2. Chronic right PCA distribution infarct, with multiple additional remote bilateral cerebellar and right basal ganglia infarcts as above. 3. Underlying age-related cerebral atrophy with moderate chronic microvascular ischemic disease.   TTE      Disposition Plan  :    Status is: Observation  DVT Prophylaxis  :     apixaban (ELIQUIS) tablet 5 mg     Lab Results  Component Value Date   PLT 144 (L) 03/17/2023    Diet :  Diet Order             Diet regular Fluid consistency: Thin  Diet effective now                    Inpatient Medications  Scheduled Meds:   stroke: early stages of recovery book   Does not apply Once   apixaban  5 mg Oral BID   dofetilide  125 mcg Oral BID   DULoxetine  30 mg Oral Daily   ezetimibe  10 mg Oral Daily   gabapentin  300 mg Oral TID   insulin aspart  0-15 Units Subcutaneous TID WC   insulin glargine-yfgn  15 Units Subcutaneous BID   metoprolol tartrate  12.5 mg Oral BID   potassium chloride  40 mEq Oral Once   rosuvastatin  20 mg Oral QHS   Continuous Infusions:  magnesium sulfate bolus IVPB     PRN Meds:.acetaminophen **OR** acetaminophen (TYLENOL) oral liquid 160 mg/5 mL **OR** acetaminophen,  senna-docusate  Antibiotics  :    Anti-infectives (From admission, onward)    None         Objective:   Vitals:   03/17/23 0025 03/17/23 0330 03/17/23 0400 03/17/23 0800  BP: (!) 132/96 136/72 130/75 138/74  Pulse: 71 73 75   Resp: (!) 22 18 14    Temp: (!) 97.4 F (36.3 C) 97.6 F (36.4 C)   (!) 97.3 F (36.3 C)  TempSrc: Oral Oral  Oral  SpO2: 98% 96% 95%   Weight:      Height:        Wt Readings from Last 3 Encounters:  03/16/23 70.8 kg  02/25/23 70.2 kg  01/28/23 73.5 kg    No intake or output data in the 24 hours ending 03/17/23 1023   Physical Exam  Awake Alert, No new F.N deficits, Normal affect [place].AT,PERRAL Supple Neck, No JVD,   Symmetrical Chest wall movement, Good air movement bilaterally, CTAB RRR,No Gallops,Rubs or new Murmurs,  +ve B.Sounds, Abd Soft, No tenderness,   No Cyanosis, Clubbing or edema      Data Review:    Recent Labs  Lab 03/16/23 1302 03/17/23 0130  WBC 5.0 5.6  HGB 12.1* 12.1*  HCT 35.3* 35.1*  PLT 149* 144*  MCV 93.9 92.1  MCH 32.2 31.8  MCHC 34.3 34.5  RDW 12.9 13.0    Recent Labs  Lab 03/16/23 1302 03/16/23 1327 03/17/23 0130 03/17/23 0610 03/17/23 0630  NA 139  --  139  --  139  K 4.5  --  3.8  --  4.6  CL 105  --  101  --  105  CO2 30  --  25  --  26  ANIONGAP 4*  --  13  --  8  GLUCOSE 202*  --  179*  --  137*  BUN 21  --  21  --  23  CREATININE 1.54*  --  1.80*  --  1.77*  AST  --   --  31  --   --   ALT  --   --  36  --   --   ALKPHOS  --   --  46  --   --   BILITOT  --   --  0.7  --   --   ALBUMIN  --   --  3.8  --   --   CRP  --   --  0.5  --   --   INR  --  1.2  --   --   --   HGBA1C  --   --  7.0*  --   --   MG  --   --   --  1.9  --   CALCIUM 9.2  --  9.3  --  9.2      Recent Labs  Lab 03/16/23 1302 03/16/23 1327 03/17/23 0130 03/17/23 0610 03/17/23 0630  CRP  --   --  0.5  --   --   INR  --  1.2  --   --   --   HGBA1C  --   --  7.0*  --   --   MG  --   --   --  1.9  --   CALCIUM 9.2  --  9.3  --  9.2    --------------------------------------------------------------------------------------------------------------- Lab Results  Component Value Date   CHOL 123 03/17/2023   HDL 44 03/17/2023   LDLCALC 45 03/17/2023  TRIG 172 (H) 03/17/2023   CHOLHDL 2.8 03/17/2023     Lab Results  Component Value Date   HGBA1C 7.0 (H) 03/17/2023      Radiology Reports MR BRAIN WO CONTRAST  Result Date: 03/16/2023 CLINICAL DATA:  Initial evaluation for acute TIA. EXAM: MRI HEAD WITHOUT CONTRAST TECHNIQUE: Multiplanar, multiecho pulse sequences of the brain and surrounding structures were obtained without intravenous contrast. COMPARISON:  Prior study from earlier the same day. FINDINGS: Brain: Generalized age-related cerebral atrophy. Patchy and confluent T2/FLAIR hyperintensity involving the periventricular deep white matter both cerebral hemispheres, most likely related to chronic microvascular ischemic disease, moderately advanced in nature. Encephalomalacia and gliosis involving the right occipital lobe, consistent with a chronic right PCA distribution infarct. Multiple scatter remote bilateral cerebellar infarcts. Remote lacunar infarct present at the right basal ganglia. No abnormal foci of restricted diffusion to suggest acute or subacute ischemia. Note made of an apparent focus of diffusion signal abnormality along the ventral aspect of the medulla along the midline (series 2, image 12), not seen on corresponding sequences, and favored to be artifactual. Gray-white matter differentiation maintained. No acute intracranial hemorrhage. Few scattered chronic micro hemorrhages noted, most notably about the cerebellum, likely related to chronic hypertension. No mass lesion, midline shift or mass effect. No hydrocephalus or extra-axial fluid collection. Pituitary gland suprasellar region within normal limits. Vascular: Major intracranial vascular flow voids are maintained. Skull and upper cervical spine: Craniocervical junction with normal limits. Bone marrow signal intensity normal. No scalp soft tissue abnormality. Sinuses/Orbits: Globes and orbital soft tissues within normal limits. Paranasal sinuses are largely clear. Trace bilateral mastoid effusions, of doubtful significance.  Other: None. IMPRESSION: 1. No acute intracranial abnormality. 2. Chronic right PCA distribution infarct, with multiple additional remote bilateral cerebellar and right basal ganglia infarcts as above. 3. Underlying age-related cerebral atrophy with moderate chronic microvascular ischemic disease. Electronically Signed   By: Rise Mu M.D.   On: 03/16/2023 21:25   CT ANGIO HEAD NECK W WO CM  Result Date: 03/16/2023 CLINICAL DATA:  Neuro deficit, acute, stroke suspected. Transient bilateral vision loss. EXAM: CT ANGIOGRAPHY HEAD AND NECK WITH AND WITHOUT CONTRAST TECHNIQUE: Multidetector CT imaging of the head and neck was performed using the standard protocol during bolus administration of intravenous contrast. Multiplanar CT image reconstructions and MIPs were obtained to evaluate the vascular anatomy. Carotid stenosis measurements (when applicable) are obtained utilizing NASCET criteria, using the distal internal carotid diameter as the denominator. RADIATION DOSE REDUCTION: This exam was performed according to the departmental dose-optimization program which includes automated exposure control, adjustment of the mA and/or kV according to patient size and/or use of iterative reconstruction technique. CONTRAST:  75mL OMNIPAQUE IOHEXOL 350 MG/ML SOLN COMPARISON:  MRI of the brain September 18, 2021. FINDINGS: CT HEAD FINDINGS Brain: No evidence of acute infarction, hemorrhage, hydrocephalus, extra-axial collection or mass lesion/mass effect. Chronic infarct in the right parietooccipital region. Small remote infarcts are also seen in the left cerebellar hemisphere. Vascular: Calcified plaques in the bilateral carotid siphons. Skull: Normal. Negative for fracture or focal lesion. Sinuses/Orbits: No acute finding. Other: None. Review of the MIP images confirms the above findings CTA NECK FINDINGS Aortic arch: Standard branching. Imaged portion shows no evidence of aneurysm or dissection. Calcified plaques  are seen in the aortic arch extending to the origin of the major neck arteries. No significant stenosis at the innominate and left subclavian artery origins. Left common carotid artery origin is obscured by dense contrast in the left brachiocephalic vein.  Right carotid system: Atherosclerotic changes with mixed density plaque in the proximal right common carotid artery without significant stenosis. Atherosclerotic changes of the right carotid bifurcation with mixed density plaques resulting in approximately 50% stenosis. Left carotid system: The origin and proximal segments of the left common carotid arteries are obscured by dense contrast on adjacent vein. Mild atherosclerotic changes along the left common carotid artery without hemodynamically significant stenosis. Atherosclerotic changes of the left carotid bifurcation resulting approximately 60% stenosis. Vertebral arteries: Atherosclerotic changes at the origin of the right vertebral artery with severe stenosis. There is also moderate stenosis of the right vertebral artery at the V3-V4 junction. Atherosclerotic changes are also seen at the origin of the dominant left vertebral artery resulting in severe stenosis. There is also at least moderate stenosis of the left vertebral artery at the C5-6 level secondary to spondylosis. Skeleton: Degenerative changes of the cervical spine. Postsurgical changes from prior sternotomy. Other neck: Negative. Upper chest: Negative. Review of the MIP images confirms the above findings CTA HEAD FINDINGS Anterior circulation: Calcified plaques in the bilateral carotid siphons with moderate stenosis at the paraclinoid segment on the right. The bilateral MCA and ACA vascular trees are maintained without high-grade stenosis, aneurysm or other significant vascular abnormality. Posterior circulation: Atherosclerotic changes of the intracranial right vertebral artery with severe stenosis at the vertebrobasilar junction. Atherosclerotic  changes of the intracranial left vertebral artery with mild stenosis. Severe atherosclerotic changes of the basilar artery with severe mid/proximal stenosis. Mild stenosis at the origin of the right posterior cerebral artery. The left posterior cerebral artery originates from the left ICA and is maintained. Venous sinuses: As permitted by contrast timing, patent. Anatomic variants: Fetal left PCA. Review of the MIP images confirms the above findings IMPRESSION: 1. No acute intracranial abnormality. 2. Chronic infarct in the right parietooccipital region. Small remote infarcts in the left cerebellar hemisphere. 3. No intracranial large vessel occlusion. 4. Atherosclerotic changes of the basilar artery with severe mid/proximal stenosis. 5. Atherosclerotic changes of the bilateral carotid siphons with moderate stenosis at the paraclinoid segment on the right. 6. Atherosclerotic changes of the bilateral vertebral arteries with severe stenosis their origin. 7. Atherosclerotic changes of the left carotid bifurcation resulting in approximately 60% stenosis. 8. Atherosclerotic changes of the right carotid bifurcation resulting approximately 50% stenosis. 9. Aortic atherosclerosis. Aortic Atherosclerosis (ICD10-I70.0). Electronically Signed   By: Baldemar Lenis M.D.   On: 03/16/2023 15:35   DG Chest 2 View  Result Date: 03/16/2023 CLINICAL DATA:  Chest pain. EXAM: CHEST - 2 VIEW COMPARISON:  08/12/2022. FINDINGS: Bilateral lung fields are clear. Bilateral costophrenic angles are clear. Normal cardio-mediastinal silhouette. Sternotomy wires noted, status post CABG. Aortic arch calcifications seen. No acute osseous abnormalities. The soft tissues are within normal limits. IMPRESSION: No acute cardiopulmonary process. Electronically Signed   By: Jules Schick M.D.   On: 03/16/2023 15:18      Signature  -   Susa Raring M.D on 03/17/2023 at 10:23 AM   -  To page go to [email or web address]

## 2023-03-17 NOTE — Progress Notes (Addendum)
Pharmacy: Dofetilide (Tikosyn) - Follow Up Assessment and Electrolyte Replacement  Pharmacy consulted to assist in monitoring and replacing electrolytes in this 77 y.o. male admitted on 03/16/2023 undergoing dofetilide re-initiation. First dofetilide dose: 9/26 @1000 . Last dose taken on 9/25. Patient previously on tikosyn 125 mcg BID. Last Qtc 422.  Labs:    Component Value Date/Time   K 3.8 03/17/2023 0130   MG 2.0 10/01/2019 0944     Plan: Potassium: K 3.8-3.9:  Give KCl 40 mEq po x1   Magnesium: Magnesium level being added on to previously collection. Will follow up level prior to tikosyn dose at 1000   Thank you for allowing pharmacy to participate in this patient's care   Gari Tubridy 03/17/2023  6:05 AM   ADDENDUM: -magnesium returned at 1.9. Replaced with 2g IV x1  Arabella Merles, PharmD. Clinical Pharmacist 03/17/2023 7:00 AM

## 2023-03-17 NOTE — Evaluation (Signed)
Speech Language Pathology Evaluation Patient Details Name: Marvin Salinas MRN: [id number] DOB: Feb 07, 1946 Today's Date: 03/17/2023 Time: 1430-1440 SLP Time Calculation (min) (ACUTE ONLY): 10 min  Problem List:  Patient Active Problem List   Diagnosis Date Noted   Amaurosis fugax of left eye 03/16/2023   Lambda light chain myeloma (HCC) 10/28/2021   Former smoker 07/15/2021   IDA (iron deficiency anemia) 05/09/2020   Lumbar radiculopathy 01/21/2020   Chronic pain of left knee 01/03/2020   Hypertension    Ischemic heart disease    OSA (obstructive sleep apnea) 01/11/2018   PAD (peripheral artery disease) (HCC) 11/18/2017   Nonsustained ventricular tachycardia (HCC) 10/13/2017   Paroxysmal atrial flutter (HCC) 10/13/2017   Hx of NSTEMI 10/12/2017   Chronic kidney disease (CKD), stage III (moderate) (HCC) 04/17/2014   Diabetic neuropathy (HCC) 04/17/2014   History of stroke 04/17/2014   Hyperlipidemia 03/15/2011   Carotid artery disease (HCC) 10/06/2010   S/P CABG x 3 10/06/2010   Claudication (HCC) 10/06/2010   Spinal stenosis 10/06/2010   Controlled diabetes mellitus type 2 with complications (HCC) 09/18/2007   Past Medical History:  Past Medical History:  Diagnosis Date   Acute kidney injury superimposed on chronic kidney disease (HCC) 10/12/2017   Atrial fibrillation with RVR (HCC) 10/27/2017   Back pain    Basilar artery stenosis    on chronic Plavix   Benign prostatic hypertrophy without urinary obstruction 04/17/2014   Carotid artery disease (HCC) 10/06/2010   Carotid US 04/2019: Bilat ICA 40-59; L subclavian stenosis  // Carotid US 11/21: Bilat ICA 40-59; bilateral subclavian stenosis // Carotid US 11/22: Bilateral ICA 40-59; left subclavian stenosis // Carotid US 04/28/2022: Bilateral ICA 40-59; left subclavian stenosis   Cerebral vascular disease    with prior TIA's; followed by Dr. Sandria Manly   Depression, neurotic 11/21/2013   Diabetes mellitus    on insulin    Enthesopathy of ankle and tarsus 09/04/2007   Overview:  Metatarsalgia    Enthesopathy of ankle and tarsus 09/04/2007   Overview:  Metatarsalgia  Formatting of this note might be different from the original. Metatarsalgia  10/1 IMO update   History of renal calculi    Hyperlipidemia    Hypertension    Ischemic heart disease    prior PCI to RCA in 1989. S/P PCI to LAD and OM in 1992. S/P PCI to first DX in 2000. S/P CABG x 3 in May 2011   Lambda light chain myeloma (HCC) 10/28/2021   Left hip pain 01/03/2020   OSA (obstructive sleep apnea) 01/11/2018   AHI 18.1 and SaO2 low 73%   PAD (peripheral artery disease) (HCC) 11/18/2017   ABIs/Arterial US 04/2019: R 1.21; L 0.66 // R SFA 30-49, stable > 50 CIA and EIA stenosis; L > 50 CIA stenosis (likely represents severe stenosis or short segment occlusion)   Peripheral neuropathy    Pneumonia 02/2011   Sacroiliac joint dysfunction of left side 01/03/2020   Stroke (HCC) 04/16/2001   small right cerebellar infarct on 04/16/2001 at that time he was found to have proximal left vertebral artery, proximal left common carotid artery and both external carotid artery stenosis as well as intracranial stenosis involving mid basilar artery- 07/2011 add questionable TIA   Past Surgical History:  Past Surgical History:  Procedure Laterality Date    NASAL ENDOSCOPY  01/11/2020   chronic rhinitis w/o evidence of acute sinusitis, bilateral inferior turbinate hypertrophy   ABDOMINAL AORTOGRAM W/LOWER EXTREMITY Bilateral 05/30/2019   Procedure: ABDOMINAL  AORTOGRAM W/LOWER EXTREMITY;  Surgeon: Iran Ouch, MD;  Location: [facility];  Service: Cardiovascular;  Laterality: Bilateral;   ANGIOPLASTY  1989   right coronary artery   ANGIOPLASTY  1992   LAD and OM   ANGIOPLASTY  1998   First DX   BRAIN SURGERY     on prior records   CORONARY ARTERY BYPASS GRAFT  11/12/2009   LIMA to LAD, SVG to OM and SVG to RCA   CORONARY STENT PLACEMENT  2000    Stent to LAD/Circumflex with angioplasty to first diagonal    LEFT HEART CATH AND CORS/GRAFTS ANGIOGRAPHY N/A 10/13/2017   Procedure: LEFT HEART CATH AND CORS/GRAFTS ANGIOGRAPHY;  Surgeon: Yvonne Kendall, MD;  Location: [facility];  Service: Cardiovascular;  Laterality: N/A;   HPI:  Pt is a 77 yo male presenting to ED 9/25 with worsening L sided vision loss and chest pain. MRI Brain unremarkable for acute findings. Per neuro consult, suspect vertigo and vision loss due to vertebrobasilar insufficiency. PMH includes HTN, HLD, CVA, PAD s/p stent, CAD s/p CABG, atrial flutter, CKD III, OSA, lambda light chain myeloma, depression   Assessment / Plan / Recommendation Clinical Impression  Pt reports living at home alone and typically being fully independent with all ADLs. He reports no acute concerns with cognition. Pt scored a 26/30 on the SLUMS (a score of 27 or above is considered WFL) characterized by deficits related to memory retrieval and divergent naming. Overall, pt reports feeling that he is at his baseline. Provided education regarding OP SLP if pt observes increased difficulty upon d/c. No further needs identified acutely. SLP to s/o at this time.    SLP Assessment  SLP Recommendation/Assessment: All further Speech Lanaguage Pathology  needs can be addressed in the next venue of care SLP Visit Diagnosis: Cognitive communication deficit (R41.841)    Recommendations for follow up therapy are one component of a multi-disciplinary discharge planning process, led by the attending physician.  Recommendations may be updated based on patient status, additional functional criteria and insurance authorization.    Follow Up Recommendations  Outpatient SLP    Assistance Recommended at Discharge  PRN  Functional Status Assessment Patient has not had a recent decline in their functional status  Frequency and Duration           SLP Evaluation Cognition  Overall Cognitive Status:  Impaired/Different from baseline Arousal/Alertness: Awake/alert Orientation Level: Oriented X4 Attention: Selective Selective Attention: Appears intact Memory: Impaired Memory Impairment: Retrieval deficit Awareness: Appears intact Problem Solving: Appears intact       Comprehension  Auditory Comprehension Overall Auditory Comprehension: Appears within functional limits for tasks assessed    Expression Expression Primary Mode of Expression: Verbal Verbal Expression Overall Verbal Expression: Appears within functional limits for tasks assessed Written Expression Dominant Hand: Right   Oral / Motor  Oral Motor/Sensory Function Overall Oral Motor/Sensory Function: Within functional limits Motor Speech Overall Motor Speech: Appears within functional limits for tasks assessed            Gwynneth Aliment, M.A., CF-SLP Speech Language Pathology, Acute Rehabilitation Services  Secure Chat preferred [phone]  03/17/2023, 3:14 PM

## 2023-03-18 ENCOUNTER — Other Ambulatory Visit (HOSPITAL_COMMUNITY): Payer: Self-pay

## 2023-03-18 ENCOUNTER — Encounter: Payer: Self-pay | Admitting: Hematology & Oncology

## 2023-03-18 ENCOUNTER — Observation Stay (HOSPITAL_COMMUNITY): Payer: 59

## 2023-03-18 ENCOUNTER — Encounter: Payer: Self-pay | Admitting: Family

## 2023-03-18 DIAGNOSIS — G453 Amaurosis fugax: Secondary | ICD-10-CM | POA: Diagnosis not present

## 2023-03-18 LAB — BASIC METABOLIC PANEL
Anion gap: 5 (ref 5–15)
BUN: 32 mg/dL — ABNORMAL HIGH (ref 8–23)
CO2: 26 mmol/L (ref 22–32)
Calcium: 8.7 mg/dL — ABNORMAL LOW (ref 8.9–10.3)
Chloride: 105 mmol/L (ref 98–111)
Creatinine, Ser: 1.57 mg/dL — ABNORMAL HIGH (ref 0.61–1.24)
GFR, Estimated: 45 mL/min — ABNORMAL LOW (ref >60)
Glucose, Bld: 332 mg/dL — ABNORMAL HIGH (ref 70–99)
Potassium: 4.7 mmol/L (ref 3.5–5.1)
Sodium: 136 mmol/L (ref 135–145)

## 2023-03-18 LAB — GLUCOSE, CAPILLARY
Glucose-Capillary: 241 mg/dL — ABNORMAL HIGH (ref 70–99)
Glucose-Capillary: 318 mg/dL — ABNORMAL HIGH (ref 70–99)

## 2023-03-18 LAB — MAGNESIUM: Magnesium: 2 mg/dL (ref 1.7–2.4)

## 2023-03-18 MED ORDER — PANTOPRAZOLE SODIUM 40 MG PO TBEC
40.0000 mg | DELAYED_RELEASE_TABLET | Freq: Every day | ORAL | 0 refills | Status: DC
  Filled 2023-03-18: qty 30, 30d supply, fill #0

## 2023-03-18 MED ORDER — ASPIRIN 81 MG PO CHEW
81.0000 mg | CHEWABLE_TABLET | Freq: Every day | ORAL | 0 refills | Status: DC
Start: 2023-03-18 — End: 2023-05-07
  Filled 2023-03-18: qty 90, 90d supply, fill #0

## 2023-03-18 NOTE — Plan of Care (Signed)
  Problem: Education: Goal: Knowledge of disease or condition will improve Outcome: Completed/Met Goal: Knowledge of secondary prevention will improve (MUST DOCUMENT ALL) Outcome: Completed/Met Goal: Knowledge of patient specific risk factors will improve (Mark N/A or DELETE if not current risk factor) Outcome: Completed/Met   Problem: Ischemic Stroke/TIA Tissue Perfusion: Goal: Complications of ischemic stroke/TIA will be minimized Outcome: Completed/Met   Problem: Coping: Goal: Will verbalize positive feelings about self Outcome: Completed/Met Goal: Will identify appropriate support needs Outcome: Completed/Met   Problem: Health Behavior/Discharge Planning: Goal: Ability to manage health-related needs will improve Outcome: Completed/Met Goal: Goals will be collaboratively established with patient/family Outcome: Completed/Met   Problem: Self-Care: Goal: Ability to participate in self-care as condition permits will improve Outcome: Completed/Met Goal: Verbalization of feelings and concerns over difficulty with self-care will improve Outcome: Completed/Met Goal: Ability to communicate needs accurately will improve Outcome: Completed/Met   Problem: Nutrition: Goal: Risk of aspiration will decrease Outcome: Completed/Met Goal: Dietary intake will improve Outcome: Completed/Met   Problem: Education: Goal: Ability to describe self-care measures that may prevent or decrease complications (Diabetes Survival Skills Education) will improve Outcome: Completed/Met Goal: Individualized Educational Video(s) Outcome: Completed/Met   Problem: Coping: Goal: Ability to adjust to condition or change in health will improve Outcome: Completed/Met   Problem: Fluid Volume: Goal: Ability to maintain a balanced intake and output will improve Outcome: Completed/Met   Problem: Health Behavior/Discharge Planning: Goal: Ability to identify and utilize available resources and services will  improve Outcome: Completed/Met Goal: Ability to manage health-related needs will improve Outcome: Completed/Met   Problem: Metabolic: Goal: Ability to maintain appropriate glucose levels will improve Outcome: Completed/Met   Problem: Nutritional: Goal: Maintenance of adequate nutrition will improve Outcome: Completed/Met Goal: Progress toward achieving an optimal weight will improve Outcome: Completed/Met   Problem: Skin Integrity: Goal: Risk for impaired skin integrity will decrease Outcome: Completed/Met   Problem: Tissue Perfusion: Goal: Adequacy of tissue perfusion will improve Outcome: Completed/Met   Problem: Education: Goal: Knowledge of General Education information will improve Description: Including pain rating scale, medication(s)/side effects and non-pharmacologic comfort measures Outcome: Completed/Met   Problem: Health Behavior/Discharge Planning: Goal: Ability to manage health-related needs will improve Outcome: Completed/Met   Problem: Clinical Measurements: Goal: Ability to maintain clinical measurements within normal limits will improve Outcome: Completed/Met Goal: Will remain free from infection Outcome: Completed/Met Goal: Diagnostic test results will improve Outcome: Completed/Met Goal: Respiratory complications will improve Outcome: Completed/Met Goal: Cardiovascular complication will be avoided Outcome: Completed/Met   Problem: Activity: Goal: Risk for activity intolerance will decrease Outcome: Completed/Met   Problem: Nutrition: Goal: Adequate nutrition will be maintained Outcome: Completed/Met   Problem: Coping: Goal: Level of anxiety will decrease Outcome: Completed/Met   Problem: Elimination: Goal: Will not experience complications related to bowel motility Outcome: Completed/Met Goal: Will not experience complications related to urinary retention Outcome: Completed/Met   Problem: Pain Managment: Goal: General experience of  comfort will improve Outcome: Completed/Met   Problem: Safety: Goal: Ability to remain free from injury will improve Outcome: Completed/Met   Problem: Skin Integrity: Goal: Risk for impaired skin integrity will decrease Outcome: Completed/Met   

## 2023-03-18 NOTE — Discharge Instructions (Addendum)
Follow with Primary MD Claiborne Billings, Renee A, DO in 7 days   Get CBC, CMP, 2 view Chest X ray -  checked next visit with your primary MD    Activity: As tolerated with Full fall precautions use walker/cane & assistance as needed  Disposition Home    Diet: Heart Healthy Low Carb  Accuchecks 4 times/day, Once in AM empty stomach and then before each meal. Log in all results and show them to your Prim.MD in 3 days. If any glucose reading is under 80 or above 300 call your Prim MD immidiately. Follow Low glucose instructions for glucose under 80 as instructed.   Special Instructions: If you have smoked or chewed Tobacco  in the last 2 yrs please stop smoking, stop any regular Alcohol  and or any Recreational drug use.  On your next visit with your primary care physician please Get Medicines reviewed and adjusted.  Please request your Prim.MD to go over all Hospital Tests and Procedure/Radiological results at the follow up, please get all Hospital records sent to your Prim MD by signing hospital release before you go home.  If you experience worsening of your admission symptoms, develop shortness of breath, life threatening emergency, suicidal or homicidal thoughts you must seek medical attention immediately by calling 911 or calling your MD immediately  if symptoms less severe.  You Must read complete instructions/literature along with all the possible adverse reactions/side effects for all the Medicines you take and that have been prescribed to you. Take any new Medicines after you have completely understood and accpet all the possible adverse reactions/side effects.   Do not drive when taking Pain medications.  Do not take more than prescribed Pain, Sleep and Anxiety Medications

## 2023-03-18 NOTE — TOC Transition Note (Signed)
Transition of Care [facility]) - CM/SW Discharge Note   Patient Details  Name: Marvin Salinas MRN: [id number] Date of Birth: 02/20/1946  [facility] CM/SW Contact:  Gordy Clement, RN Phone Number: 03/18/2023, 9:19 AM   Clinical Narrative:     Patient to DC to home today. No follow up PT or OT recommended. Speech has recommended patient follow up as OP IF they have more difficulty upon DC.  Patient has follow up appointment at neurology and will probably discuss then. Family to transport   No additional TOC needs           Patient Goals and CMS Choice      Discharge Placement                         Discharge Plan and Services Additional resources added to the After Visit Summary for                                       Social Determinants of Health (SDOH) Interventions SDOH Screenings   Food Insecurity: No Food Insecurity (06/26/2022)  Housing: Low Risk  (06/26/2022)  Transportation Needs: No Transportation Needs (06/26/2022)  Utilities: Not At Risk (03/19/2022)  Alcohol Screen: Low Risk  (03/19/2022)  Depression (PHQ2-9): Low Risk  (10/11/2022)  Financial Resource Strain: Medium Risk (06/26/2022)  Physical Activity: Unknown (06/26/2022)  Social Connections: Socially Isolated (06/26/2022)  Stress: No Stress Concern Present (06/26/2022)  Tobacco Use: Medium Risk (03/16/2023)     Readmission Risk Interventions     No data to display

## 2023-03-18 NOTE — Plan of Care (Signed)

## 2023-03-18 NOTE — Progress Notes (Addendum)
STROKE TEAM PROGRESS NOTE   BRIEF HPI [name] is a 77 y.o. male with history of hypertension, hyperlipidemia, CVA (2002) with unsuccessful basilar artery stenting, TIA (2013), PAD status post left common iliac stenting, CAD status post CABG, A-flutter, diabetes, CKD 3, OSA, and a ~16-month history of permanent left lateral vision impairment who presents for a 30-second episode of vertigo and left-sided blurry vision, now resolved.  Found to have a new but chronic appearing right PCA stroke from prior MRI (08/2021).  CTA head and neck showed severe basilar stenosis, severe vertebral artery stenosis, and borderline carotid artery disease.  Symptomatology consistent with vertebrobasilar insufficiency in setting of stenosis patient is adherent with home Eliquis 5 twice daily, Crestor 20, ezetimibe 10.  SIGNIFICANT HOSPITAL EVENTS 9/25: Presents with left-sided vision and vertigo.  Labs unremarkable.  INTERIM HISTORY/SUBJECTIVE Patient standing in room.  No new complaints.  Wants to go home.  Advised to follow up with neuro IR in outpatient to consider another attempt at basilar artery stenting and add aspirin to his home regimen.  At baseline.   OBJECTIVE  CBC    Component Value Date/Time   WBC 5.6 03/17/2023 0130   RBC 3.81 (L) 03/17/2023 0130   HGB 12.1 (L) 03/17/2023 0130   HGB 12.0 (L) 02/25/2023 0921   HGB 13.4 05/22/2019 0912   HCT 35.1 (L) 03/17/2023 0130   HCT 40.2 05/22/2019 0912   PLT 144 (L) 03/17/2023 0130   PLT 156 02/25/2023 0921   PLT 307 05/22/2019 0912   MCV 92.1 03/17/2023 0130   MCV 91 05/22/2019 0912   MCH 31.8 03/17/2023 0130   MCHC 34.5 03/17/2023 0130   RDW 13.0 03/17/2023 0130   RDW 13.3 05/22/2019 0912   LYMPHSABS 1.1 02/25/2023 0921   MONOABS 0.6 02/25/2023 0921   EOSABS 0.1 02/25/2023 0921   BASOSABS 0.0 02/25/2023 0921    BMET    Component Value Date/Time   NA 136 03/18/2023 1010   NA 140 08/31/2021 1026   K 4.7 03/18/2023 1010   CL 105  03/18/2023 1010   CO2 26 03/18/2023 1010   GLUCOSE 332 (H) 03/18/2023 1010   BUN 32 (H) 03/18/2023 1010   BUN 21 08/31/2021 1026   CREATININE 1.57 (H) 03/18/2023 1010   CREATININE 1.66 (H) 02/25/2023 0921   CALCIUM 8.7 (L) 03/18/2023 1010   EGFR 53 (L) 08/31/2021 1026   GFRNONAA 45 (L) 03/18/2023 1010   GFRNONAA 42 (L) 02/25/2023 0921    IMAGING past 24 hours ECHOCARDIOGRAM COMPLETE BUBBLE STUDY  Result Date: 03/17/2023    ECHOCARDIOGRAM REPORT   Patient Name:   Marvin Salinas Date of Exam: 03/17/2023 Medical Rec #:  [id number]      Height:       68.0 in Accession #:    [id number]     Weight:       156.0 lb Date of Birth:  05-01-1946       BSA:          1.839 m Patient Age:    77 years       BP:           130/77 mmHg Patient Gender: M              HR:           57 bpm. Exam Location:  Inpatient Procedure: 2D Echo, Cardiac Doppler, Color Doppler and Saline Contrast Bubble  Study                            MODIFIED REPORT: This report was modified by Thomasene Ripple DO on 03/17/2023 due to TR jet.  Indications:     TIA 435.9 / G45.9  History:         Patient has prior history of Echocardiogram examinations, most                  recent 10/12/2017. CAD, Arrythmias:Atrial Fibrillation; Risk                  Factors:Hypertension and Dyslipidemia.  Sonographer:     Thedora Hinders RDCS Referring Phys:  [id number] Cecille Po MELVIN Diagnosing Phys: Lavona Mound Tobb DO IMPRESSIONS  1. Left ventricular ejection fraction, by estimation, is 60 to 65%. The left ventricle has normal function. The left ventricle has no regional wall motion abnormalities. There is mild concentric left ventricular hypertrophy. Left ventricular diastolic parameters are indeterminate.  2. Right ventricular systolic function is normal. The right ventricular size is normal. Tricuspid regurgitation signal is inadequate for assessing PA pressure.  3. The mitral valve is normal in structure. Mild mitral valve regurgitation. No evidence of mitral  stenosis.  4. The aortic valve is normal in structure. Aortic valve regurgitation is not visualized. Aortic valve sclerosis is present, with no evidence of aortic valve stenosis.  5. There is mild dilatation of the ascending aorta, measuring 41 mm.  6. The inferior vena cava is normal in size with greater than 50% respiratory variability, suggesting right atrial pressure of 3 mmHg.  7. Agitated saline contrast bubble study was negative, with no evidence of any interatrial shunt. FINDINGS  Left Ventricle: Left ventricular ejection fraction, by estimation, is 60 to 65%. The left ventricle has normal function. The left ventricle has no regional wall motion abnormalities. The left ventricular internal cavity size was normal in size. There is  mild concentric left ventricular hypertrophy. Left ventricular diastolic parameters are indeterminate. Right Ventricle: The right ventricular size is normal. No increase in right ventricular wall thickness. Right ventricular systolic function is normal. Tricuspid regurgitation signal is inadequate for assessing PA pressure. Left Atrium: Left atrial size was normal in size. Right Atrium: Right atrial size was normal in size. Pericardium: There is no evidence of pericardial effusion. Mitral Valve: The mitral valve is normal in structure. Mild mitral valve regurgitation. No evidence of mitral valve stenosis. Tricuspid Valve: The tricuspid valve is not well visualized. Tricuspid valve regurgitation is trivial. No evidence of tricuspid stenosis. Aortic Valve: The aortic valve is normal in structure. Aortic valve regurgitation is not visualized. Aortic valve sclerosis is present, with no evidence of aortic valve stenosis. Pulmonic Valve: The pulmonic valve was normal in structure. Pulmonic valve regurgitation is not visualized. No evidence of pulmonic stenosis. Aorta: There is mild dilatation of the ascending aorta, measuring 41 mm. Venous: The inferior vena cava is normal in size with  greater than 50% respiratory variability, suggesting right atrial pressure of 3 mmHg. IAS/Shunts: No atrial level shunt detected by color flow Doppler. Agitated saline contrast was given intravenously to evaluate for intracardiac shunting. Agitated saline contrast bubble study was negative, with no evidence of any interatrial shunt.  LEFT VENTRICLE PLAX 2D LVIDd:         5.30 cm   Diastology LVIDs:         2.90 cm   LV e' medial:  6.20 cm/s LV PW:         1.10 cm   LV E/e' medial:  12.7 LV IVS:        1.20 cm   LV e' lateral:   8.05 cm/s LVOT diam:     1.70 cm   LV E/e' lateral: 9.8 LV SV:         44 LV SV Index:   24 LVOT Area:     2.27 cm  RIGHT VENTRICLE             IVC RV S prime:     10.30 cm/s  IVC diam: 1.80 cm TAPSE (M-mode): 1.6 cm LEFT ATRIUM             Index LA diam:        4.30 cm 2.34 cm/m LA Vol (A2C):   25.5 ml 13.87 ml/m LA Vol (A4C):   42.3 ml 23.00 ml/m LA Biplane Vol: 35.7 ml 19.41 ml/m  AORTIC VALVE LVOT Vmax:   101.00 cm/s LVOT Vmean:  70.600 cm/s LVOT VTI:    0.196 m  AORTA Ao Root diam: 3.40 cm Ao Asc diam:  4.10 cm MITRAL VALVE MV Area (PHT): 2.70 cm    SHUNTS MV Decel Time: 281 msec    Systemic VTI:  0.20 m MV E velocity: 78.80 cm/s  Systemic Diam: 1.70 cm MV A velocity: 89.40 cm/s MV E/A ratio:  0.88 Kardie Tobb DO Electronically signed by Thomasene Ripple DO Signature Date/Time: 03/17/2023/3:52:12 PM    Final (Updated)     Vitals:   03/18/23 0000 03/18/23 0400 03/18/23 0800 03/18/23 1217  BP: 138/60 130/67 99/70 115/67  Pulse: 74 60 79 75  Resp: 15 16 (!) 21 19  Temp: 97.7 F (36.5 C) 97.8 F (36.6 C) 97.6 F (36.4 C) (!) 97.3 F (36.3 C)  TempSrc: Oral Oral Oral   SpO2: 91% 95% 95% 96%  Weight:      Height:         PHYSICAL EXAM General:  Alert, well-nourished, well-developed patient in no acute distress Psych:  Mood and affect appropriate for situation CV: Regular rate and rhythm on monitor Respiratory:  Regular, unlabored respirations on room air GI: Abdomen  soft and nontender   NEURO:  Mental Status: AA&Ox3, patient is able to give clear and coherent history Speech/Language: speech is without dysarthria or aphasia.  Naming, repetition, fluency, and comprehension intact.  Cranial Nerves:  II: PERRL.  Lower left quadrantanopsia III, IV, VI: EOMI. Eyelids elevate symmetrically.  V: Sensation is intact to light touch and symmetrical to face.  VII: Face is symmetrical resting and smiling VIII: hearing intact to voice. IX, X: Palate elevates symmetrically. Phonation is normal.  NF:[id number] shrug 5/5. XII: tongue is midline without fasciculations. Motor: 5/5 strength to all muscle groups tested.  Tone: is normal and bulk is normal Sensation- Intact to light touch bilaterally. Extinction absent to light touch to DSS.   Coordination: FTN intact bilaterally, HKS: no ataxia in BLE.No drift.  Gait- deferred   ASSESSMENT/PLAN  TIA in setting of severe, extensive posterior circulation atherosclerosis likely secondary to hypoperfusion  Exam unchanged from yesterday.  Echo unremarkable.  We will add aspirin to his home regimen of Eliquis 5 twice daily.  Recommend follow-up with neuro IR in outpatient setting.  Otherwise, patient has returned to baseline and can be discharged.  Stroke team will sign off at this time.  Code Stroke CT head: No acute intracranial abnormality. CTA head & neck:  No LVO.  Chronic right parietal occipital infarct.  Small remote infarcts in left cerebellum.  Atherosclerotic changes of: Basilar artery, severe; bilateral carotid siphons, moderate; bilateral vertebral arteries, severe; left carotid bifurcation 60%; right carotid bifurcation 50%. MRI: No acute intracranial abnormality.  Chronic right PCA distribution infarct.  Age-related cerebral atrophy. 2D Echo: Unremarkable LDL 45 HgbA1c 7.0 VTE prophylaxis -Eliquis 5 mg twice daily Eliquis 5 mg twice daily, now on aspirin 81 mg and Eliquis 5 twice daily. Therapy  recommendations: OT: No follow-up.  Awaiting PT recs Disposition: Likely home  Hx of Stroke/TIA 03/2001: Right small cerebellar infarct -- found to have severe basilar artery stenosis, left vertebral artery stenosis, and bilateral carotid disease 07/2011: +/- TIA  Atrial flutter Home Meds: Eliquis 5 twice daily, metoprolol, Tikosyn, continue the hospital Continue telemetry monitoring  Hypertension Home meds: Olmesartan 5 mg, metoprolol titrated to 12.5 mg twice daily Unstable, on losartan held, metoprolol continued and setting of permissive hypertension Goal sBPs 130-1 50  Hyperlipidemia Home meds: Rosuvastatin 20, ezetimibe 10, resumed in hospital LDL 45, goal < 70 Continue statin at discharge  Diabetes type II Controlled Home meds: Semaglutide 14 mg p.o., HgbA1c 7.0, goal < 7.0 CBGs SSI Recommend close follow-up with PCP for better DM control  Tobacco Abuse Former, 13.5 pack year   Dysphagia Patient has post-stroke dysphagia, SLP consulted    Diet   Diet regular Fluid consistency: Thin   Advance diet as tolerated  Other Stroke Risk Factors Hypertension Hyperlipidemia Previous stroke Peripheral artery disease Carotid artery disease Coronary artery disease status post CABG Atrial arrhythmia Type 2 diabetes Chronic kidney disease 3 Obstructive sleep apnea   Other Active Problems CKD 3: Slightly up trending to creatinine of 1.7 from 1.5 (baseline) OSA: CPAP  Hospital day # 1  Luiz Iron, MD Psychiatry resident, PGY 1  STROKE MD NOTE :  I have personally obtained history,examined this patient, reviewed notes, independently viewed imaging studies, participated in medical decision making and plan of care.ROS completed by me personally and pertinent positives fully documented  I have made any additions or clarifications directly to the above note. Agree with note above.  Patient presented with recurrent transient episodes of blurred vision which has  resolved.  CT angiogram showed severe proximal basilar artery stenosis which is a known finding and previous attempted angioplasty stenting 10 years ago was not successful.  His present symptoms are likely symptomatic from his severe basilar stenosis and he may benefit with diagnostic cerebral catheter angiogram as an outpatient with possible consideration for angioplasty stenting if feasible.  Refer to neurointerventional radiology for outpatient evaluation for basilar artery stenosis and stenting recommend add aspirin 81 mg daily to his anticoagulation regimen for his A-fib for stroke prevention and maintain aggressive risk factor modification.  Mild permissive hypertension with systolic blood pressure in 130-150 range and avoid dehydration.  Follow-up as an outpatient in the stroke clinic in 2 months.  Long discussion with patient and Dr. Thedore Mins and answered questions.  Greater than 50% time during this 50-minute visit was spent in counseling and coordination of care about his TIA and symptomatic basilar stenosis and discussion about evaluation and treatment plan and answering questions.  Delia Heady, MD Medical Director [facility] Pager: [phone] 03/18/2023 4:43 PM   To contact Stroke Continuity provider, please refer to [email or web address]. After hours, contact General Neurology

## 2023-03-18 NOTE — Discharge Summary (Signed)
Specialty: Cardiology Contact information: [address] [place] [address] [phone]         Maeola Harman, MD. Schedule an appointment as soon as possible for a visit in 1 week(s).   Specialties: Vascular Surgery, Cardiology Contact information: [address] [place]  [place] [address] [phone]                 Major procedures and Radiology Reports - PLEASE review detailed and final reports thoroughly  -       ECHOCARDIOGRAM COMPLETE BUBBLE STUDY  Result Date: 03/17/2023    ECHOCARDIOGRAM REPORT   Patient Name:   Marvin Salinas Date of Exam: 03/17/2023 Medical Rec #:  [id number]      Height:       68.0 in Accession #:    [id number]     Weight:       156.0 lb Date of Birth:  [birth year]       BSA:          1.839 m Patient Age:    77 years       BP:           130/77 mmHg Patient Gender: M              HR:           57 bpm. Exam Location:  Inpatient Procedure: 2D Echo, Cardiac Doppler, Color Doppler and Saline Contrast Bubble            Study                            MODIFIED REPORT: This report was modified by Thomasene Ripple DO on 03/17/2023 due to TR jet.  Indications:     TIA 435.9 / G45.9  History:         Patient has prior history of Echocardiogram examinations, most                  recent 10/12/2017. CAD, Arrythmias:Atrial Fibrillation; Risk                  Factors:Hypertension and Dyslipidemia.  Sonographer:     Thedora Hinders RDCS Referring Phys:  [id number] Cecille Po MELVIN Diagnosing Phys: Lavona [place]ound Tobb DO IMPRESSIONS  1. Left ventricular ejection fraction, by estimation, is 60 to 65%. The left ventricle has normal function. The left ventricle has no regional wall motion abnormalities. There is mild concentric left ventricular hypertrophy. Left ventricular diastolic parameters are indeterminate.  2. Right ventricular systolic function is normal. The right ventricular size is normal. Tricuspid regurgitation signal is inadequate for assessing PA pressure.  3. The mitral valve is normal in structure. Mild mitral valve regurgitation. No evidence of mitral stenosis.  4. The aortic valve is normal in structure. Aortic valve regurgitation is not visualized. Aortic valve sclerosis is present, with no evidence of aortic valve stenosis.  5. There is mild dilatation of the  ascending aorta, measuring 41 mm.  6. The inferior vena cava is normal in size with greater than 50% respiratory variability, suggesting right atrial pressure of 3 mmHg.  7. Agitated saline contrast bubble study was negative, with no evidence of any interatrial shunt. FINDINGS  Left Ventricle: Left ventricular ejection fraction, by estimation, is 60 to 65%. The left ventricle has normal function. The left ventricle has no regional wall motion abnormalities. The left ventricular internal cavity size was normal in size. There is  Specialty: Cardiology Contact information: [address] [place] [address] [phone]         Maeola Harman, MD. Schedule an appointment as soon as possible for a visit in 1 week(s).   Specialties: Vascular Surgery, Cardiology Contact information: [address] [place]  [place] [address] [phone]                 Major procedures and Radiology Reports - PLEASE review detailed and final reports thoroughly  -       ECHOCARDIOGRAM COMPLETE BUBBLE STUDY  Result Date: 03/17/2023    ECHOCARDIOGRAM REPORT   Patient Name:   Marvin Salinas Date of Exam: 03/17/2023 Medical Rec #:  [id number]      Height:       68.0 in Accession #:    [id number]     Weight:       156.0 lb Date of Birth:  [birth year]       BSA:          1.839 m Patient Age:    77 years       BP:           130/77 mmHg Patient Gender: M              HR:           57 bpm. Exam Location:  Inpatient Procedure: 2D Echo, Cardiac Doppler, Color Doppler and Saline Contrast Bubble            Study                            MODIFIED REPORT: This report was modified by Thomasene Ripple DO on 03/17/2023 due to TR jet.  Indications:     TIA 435.9 / G45.9  History:         Patient has prior history of Echocardiogram examinations, most                  recent 10/12/2017. CAD, Arrythmias:Atrial Fibrillation; Risk                  Factors:Hypertension and Dyslipidemia.  Sonographer:     Thedora Hinders RDCS Referring Phys:  [id number] Cecille Po MELVIN Diagnosing Phys: Lavona [place]ound Tobb DO IMPRESSIONS  1. Left ventricular ejection fraction, by estimation, is 60 to 65%. The left ventricle has normal function. The left ventricle has no regional wall motion abnormalities. There is mild concentric left ventricular hypertrophy. Left ventricular diastolic parameters are indeterminate.  2. Right ventricular systolic function is normal. The right ventricular size is normal. Tricuspid regurgitation signal is inadequate for assessing PA pressure.  3. The mitral valve is normal in structure. Mild mitral valve regurgitation. No evidence of mitral stenosis.  4. The aortic valve is normal in structure. Aortic valve regurgitation is not visualized. Aortic valve sclerosis is present, with no evidence of aortic valve stenosis.  5. There is mild dilatation of the  ascending aorta, measuring 41 mm.  6. The inferior vena cava is normal in size with greater than 50% respiratory variability, suggesting right atrial pressure of 3 mmHg.  7. Agitated saline contrast bubble study was negative, with no evidence of any interatrial shunt. FINDINGS  Left Ventricle: Left ventricular ejection fraction, by estimation, is 60 to 65%. The left ventricle has normal function. The left ventricle has no regional wall motion abnormalities. The left ventricular internal cavity size was normal in size. There is  Marvin Salinas ZOX:[id number] DOB: 11-21-1945 DOA: 03/16/2023  PCP: Natalia Leatherwood, DO  Admit date: 03/16/2023  Discharge date: 03/19/2023  Admitted From: Home   Disposition:  Home   Recommendations for Outpatient Follow-up:   Follow up with PCP in 1-2 weeks  PCP Please obtain BMP/CBC, 2 view CXR in 1week,  (see Discharge instructions)   PCP Please follow up on the following pending results: Kindly arrange for outpatient vascular surgery and neurology follow-up in 1 to 2 weeks.   Home Health: None   Equipment/Devices: None  Consultations: Neuro, Cards Discharge Condition: Stable    CODE STATUS: Full    Diet Recommendation: Heart Healthy Low Carb    Chief Complaint  Patient presents with   Chest Pain     Brief history of present illness from the day of admission and additional interim summary     77 y.o. male with medical history significant of HTN, HLD, CVA, PAD status post stenting, carotid artery disease, CAD status post CABG, atrial flutter, diabetes, CKD 3, OSA, lambda light chain myeloma, depression who has history with left eye vision problems for several years presented to the hospital after he had an episode of dizziness where things were spinning around him with some blurriness in his left eye which was worse than his baseline about 2 times while he was having dizzy spells, blurriness episodes lasted about 20 to 30 seconds each.  Also having some nonspecific chest pain for several weeks, came to the ER for further help.                                                                  Hospital Course   Symptoms of vertigo, 2 episodes of blurry vision in the left eye with vertigo symptoms each blurriness episode lasting 20 seconds, this is acute on chronic as he has blurry vision in the left eye on a  chronic basis as well. > Seen by neurology, he is a poor historian, for now he is already on max treatment with Eliquis and statin, LDL is stable, A1c is above goal, echo with bubble study stable, evaluated by PT OT and speech eval, CTA findings and case discussed with stroke team Dr. Pearlean Brownie, add aspirin to his Eliquis with outpatient neurology, PCP, cardiology and vascular surgery follow-up.  Patient is now symptom-free and back to his baseline eager to go home.    CTA of the neck noted with moderate bilateral ICA changes, at the very least will require outpatient vascular surgery follow-up.   Hypertension -Blood pressure stable continue home regimen   Hyperlipidemia - Continue home rosuvastatin and Zetia   PAD CAD Carotid artery disease > Known history of PAD status post left common iliac stenting, CAD status post CABG, carotid artery disease described above.  On  Marvin Salinas ZOX:[id number] DOB: 11-21-1945 DOA: 03/16/2023  PCP: Natalia Leatherwood, DO  Admit date: 03/16/2023  Discharge date: 03/19/2023  Admitted From: Home   Disposition:  Home   Recommendations for Outpatient Follow-up:   Follow up with PCP in 1-2 weeks  PCP Please obtain BMP/CBC, 2 view CXR in 1week,  (see Discharge instructions)   PCP Please follow up on the following pending results: Kindly arrange for outpatient vascular surgery and neurology follow-up in 1 to 2 weeks.   Home Health: None   Equipment/Devices: None  Consultations: Neuro, Cards Discharge Condition: Stable    CODE STATUS: Full    Diet Recommendation: Heart Healthy Low Carb    Chief Complaint  Patient presents with   Chest Pain     Brief history of present illness from the day of admission and additional interim summary     77 y.o. male with medical history significant of HTN, HLD, CVA, PAD status post stenting, carotid artery disease, CAD status post CABG, atrial flutter, diabetes, CKD 3, OSA, lambda light chain myeloma, depression who has history with left eye vision problems for several years presented to the hospital after he had an episode of dizziness where things were spinning around him with some blurriness in his left eye which was worse than his baseline about 2 times while he was having dizzy spells, blurriness episodes lasted about 20 to 30 seconds each.  Also having some nonspecific chest pain for several weeks, came to the ER for further help.                                                                  Hospital Course   Symptoms of vertigo, 2 episodes of blurry vision in the left eye with vertigo symptoms each blurriness episode lasting 20 seconds, this is acute on chronic as he has blurry vision in the left eye on a  chronic basis as well. > Seen by neurology, he is a poor historian, for now he is already on max treatment with Eliquis and statin, LDL is stable, A1c is above goal, echo with bubble study stable, evaluated by PT OT and speech eval, CTA findings and case discussed with stroke team Dr. Pearlean Brownie, add aspirin to his Eliquis with outpatient neurology, PCP, cardiology and vascular surgery follow-up.  Patient is now symptom-free and back to his baseline eager to go home.    CTA of the neck noted with moderate bilateral ICA changes, at the very least will require outpatient vascular surgery follow-up.   Hypertension -Blood pressure stable continue home regimen   Hyperlipidemia - Continue home rosuvastatin and Zetia   PAD CAD Carotid artery disease > Known history of PAD status post left common iliac stenting, CAD status post CABG, carotid artery disease described above.  On  Specialty: Cardiology Contact information: [address] [place] [address] [phone]         Maeola Harman, MD. Schedule an appointment as soon as possible for a visit in 1 week(s).   Specialties: Vascular Surgery, Cardiology Contact information: [address] [place]  [place] [address] [phone]                 Major procedures and Radiology Reports - PLEASE review detailed and final reports thoroughly  -       ECHOCARDIOGRAM COMPLETE BUBBLE STUDY  Result Date: 03/17/2023    ECHOCARDIOGRAM REPORT   Patient Name:   Marvin Salinas Date of Exam: 03/17/2023 Medical Rec #:  [id number]      Height:       68.0 in Accession #:    [id number]     Weight:       156.0 lb Date of Birth:  [birth year]       BSA:          1.839 m Patient Age:    77 years       BP:           130/77 mmHg Patient Gender: M              HR:           57 bpm. Exam Location:  Inpatient Procedure: 2D Echo, Cardiac Doppler, Color Doppler and Saline Contrast Bubble            Study                            MODIFIED REPORT: This report was modified by Thomasene Ripple DO on 03/17/2023 due to TR jet.  Indications:     TIA 435.9 / G45.9  History:         Patient has prior history of Echocardiogram examinations, most                  recent 10/12/2017. CAD, Arrythmias:Atrial Fibrillation; Risk                  Factors:Hypertension and Dyslipidemia.  Sonographer:     Thedora Hinders RDCS Referring Phys:  [id number] Cecille Po MELVIN Diagnosing Phys: Lavona [place] Tobb DO IMPRESSIONS  1. Left ventricular ejection fraction, by estimation, is 60 to 65%. The left ventricle has normal function. The left ventricle has no regional wall motion abnormalities. There is mild concentric left ventricular hypertrophy. Left ventricular diastolic parameters are indeterminate.  2. Right ventricular systolic function is normal. The right ventricular size is normal. Tricuspid regurgitation signal is inadequate for assessing PA pressure.  3. The mitral valve is normal in structure. Mild mitral valve regurgitation. No evidence of mitral stenosis.  4. The aortic valve is normal in structure. Aortic valve regurgitation is not visualized. Aortic valve sclerosis is present, with no evidence of aortic valve stenosis.  5. There is mild dilatation of the  ascending aorta, measuring 41 mm.  6. The inferior vena cava is normal in size with greater than 50% respiratory variability, suggesting right atrial pressure of 3 mmHg.  7. Agitated saline contrast bubble study was negative, with no evidence of any interatrial shunt. FINDINGS  Left Ventricle: Left ventricular ejection fraction, by estimation, is 60 to 65%. The left ventricle has normal function. The left ventricle has no regional wall motion abnormalities. The left ventricular internal cavity size was normal in size. There is  Specialty: Cardiology Contact information: [address] [place] [address] [phone]         Maeola Harman, MD. Schedule an appointment as soon as possible for a visit in 1 week(s).   Specialties: Vascular Surgery, Cardiology Contact information: [address] [place]  [place] [address] [phone]                 Major procedures and Radiology Reports - PLEASE review detailed and final reports thoroughly  -       ECHOCARDIOGRAM COMPLETE BUBBLE STUDY  Result Date: 03/17/2023    ECHOCARDIOGRAM REPORT   Patient Name:   Marvin Salinas Date of Exam: 03/17/2023 Medical Rec #:  [id number]      Height:       68.0 in Accession #:    [id number]     Weight:       156.0 lb Date of Birth:  [birth year]       BSA:          1.839 m Patient Age:    77 years       BP:           130/77 mmHg Patient Gender: M              HR:           57 bpm. Exam Location:  Inpatient Procedure: 2D Echo, Cardiac Doppler, Color Doppler and Saline Contrast Bubble            Study                            MODIFIED REPORT: This report was modified by Thomasene Ripple DO on 03/17/2023 due to TR jet.  Indications:     TIA 435.9 / G45.9  History:         Patient has prior history of Echocardiogram examinations, most                  recent 10/12/2017. CAD, Arrythmias:Atrial Fibrillation; Risk                  Factors:Hypertension and Dyslipidemia.  Sonographer:     Thedora Hinders RDCS Referring Phys:  [id number] Cecille Po MELVIN Diagnosing Phys: Lavona [place] Tobb DO IMPRESSIONS  1. Left ventricular ejection fraction, by estimation, is 60 to 65%. The left ventricle has normal function. The left ventricle has no regional wall motion abnormalities. There is mild concentric left ventricular hypertrophy. Left ventricular diastolic parameters are indeterminate.  2. Right ventricular systolic function is normal. The right ventricular size is normal. Tricuspid regurgitation signal is inadequate for assessing PA pressure.  3. The mitral valve is normal in structure. Mild mitral valve regurgitation. No evidence of mitral stenosis.  4. The aortic valve is normal in structure. Aortic valve regurgitation is not visualized. Aortic valve sclerosis is present, with no evidence of aortic valve stenosis.  5. There is mild dilatation of the  ascending aorta, measuring 41 mm.  6. The inferior vena cava is normal in size with greater than 50% respiratory variability, suggesting right atrial pressure of 3 mmHg.  7. Agitated saline contrast bubble study was negative, with no evidence of any interatrial shunt. FINDINGS  Left Ventricle: Left ventricular ejection fraction, by estimation, is 60 to 65%. The left ventricle has normal function. The left ventricle has no regional wall motion abnormalities. The left ventricular internal cavity size was normal in size. There is  Marvin Salinas ZOX:[id number] DOB: 11-21-1945 DOA: 03/16/2023  PCP: Natalia Leatherwood, DO  Admit date: 03/16/2023  Discharge date: 03/19/2023  Admitted From: Home   Disposition:  Home   Recommendations for Outpatient Follow-up:   Follow up with PCP in 1-2 weeks  PCP Please obtain BMP/CBC, 2 view CXR in 1week,  (see Discharge instructions)   PCP Please follow up on the following pending results: Kindly arrange for outpatient vascular surgery and neurology follow-up in 1 to 2 weeks.   Home Health: None   Equipment/Devices: None  Consultations: Neuro, Cards Discharge Condition: Stable    CODE STATUS: Full    Diet Recommendation: Heart Healthy Low Carb    Chief Complaint  Patient presents with   Chest Pain     Brief history of present illness from the day of admission and additional interim summary     77 y.o. male with medical history significant of HTN, HLD, CVA, PAD status post stenting, carotid artery disease, CAD status post CABG, atrial flutter, diabetes, CKD 3, OSA, lambda light chain myeloma, depression who has history with left eye vision problems for several years presented to the hospital after he had an episode of dizziness where things were spinning around him with some blurriness in his left eye which was worse than his baseline about 2 times while he was having dizzy spells, blurriness episodes lasted about 20 to 30 seconds each.  Also having some nonspecific chest pain for several weeks, came to the ER for further help.                                                                  Hospital Course   Symptoms of vertigo, 2 episodes of blurry vision in the left eye with vertigo symptoms each blurriness episode lasting 20 seconds, this is acute on chronic as he has blurry vision in the left eye on a  chronic basis as well. > Seen by neurology, he is a poor historian, for now he is already on max treatment with Eliquis and statin, LDL is stable, A1c is above goal, echo with bubble study stable, evaluated by PT OT and speech eval, CTA findings and case discussed with stroke team Dr. Pearlean Brownie, add aspirin to his Eliquis with outpatient neurology, PCP, cardiology and vascular surgery follow-up.  Patient is now symptom-free and back to his baseline eager to go home.    CTA of the neck noted with moderate bilateral ICA changes, at the very least will require outpatient vascular surgery follow-up.   Hypertension -Blood pressure stable continue home regimen   Hyperlipidemia - Continue home rosuvastatin and Zetia   PAD CAD Carotid artery disease > Known history of PAD status post left common iliac stenting, CAD status post CABG, carotid artery disease described above.  On  Marvin Salinas ZOX:[id number] DOB: 11-21-1945 DOA: 03/16/2023  PCP: Natalia Leatherwood, DO  Admit date: 03/16/2023  Discharge date: 03/19/2023  Admitted From: Home   Disposition:  Home   Recommendations for Outpatient Follow-up:   Follow up with PCP in 1-2 weeks  PCP Please obtain BMP/CBC, 2 view CXR in 1week,  (see Discharge instructions)   PCP Please follow up on the following pending results: Kindly arrange for outpatient vascular surgery and neurology follow-up in 1 to 2 weeks.   Home Health: None   Equipment/Devices: None  Consultations: Neuro, Cards Discharge Condition: Stable    CODE STATUS: Full    Diet Recommendation: Heart Healthy Low Carb    Chief Complaint  Patient presents with   Chest Pain     Brief history of present illness from the day of admission and additional interim summary     77 y.o. male with medical history significant of HTN, HLD, CVA, PAD status post stenting, carotid artery disease, CAD status post CABG, atrial flutter, diabetes, CKD 3, OSA, lambda light chain myeloma, depression who has history with left eye vision problems for several years presented to the hospital after he had an episode of dizziness where things were spinning around him with some blurriness in his left eye which was worse than his baseline about 2 times while he was having dizzy spells, blurriness episodes lasted about 20 to 30 seconds each.  Also having some nonspecific chest pain for several weeks, came to the ER for further help.                                                                  Hospital Course   Symptoms of vertigo, 2 episodes of blurry vision in the left eye with vertigo symptoms each blurriness episode lasting 20 seconds, this is acute on chronic as he has blurry vision in the left eye on a  chronic basis as well. > Seen by neurology, he is a poor historian, for now he is already on max treatment with Eliquis and statin, LDL is stable, A1c is above goal, echo with bubble study stable, evaluated by PT OT and speech eval, CTA findings and case discussed with stroke team Dr. Pearlean Brownie, add aspirin to his Eliquis with outpatient neurology, PCP, cardiology and vascular surgery follow-up.  Patient is now symptom-free and back to his baseline eager to go home.    CTA of the neck noted with moderate bilateral ICA changes, at the very least will require outpatient vascular surgery follow-up.   Hypertension -Blood pressure stable continue home regimen   Hyperlipidemia - Continue home rosuvastatin and Zetia   PAD CAD Carotid artery disease > Known history of PAD status post left common iliac stenting, CAD status post CABG, carotid artery disease described above.  On

## 2023-03-21 ENCOUNTER — Encounter: Payer: Self-pay | Admitting: Family

## 2023-03-21 ENCOUNTER — Encounter: Payer: Self-pay | Admitting: Hematology & Oncology

## 2023-03-21 ENCOUNTER — Ambulatory Visit (INDEPENDENT_AMBULATORY_CARE_PROVIDER_SITE_OTHER): Payer: 59

## 2023-03-21 ENCOUNTER — Telehealth: Payer: Self-pay

## 2023-03-21 VITALS — BP 118/60 | HR 65 | Temp 98.3°F | Wt 165.8 lb

## 2023-03-21 DIAGNOSIS — Z0000 Encounter for general adult medical examination without abnormal findings: Secondary | ICD-10-CM

## 2023-03-21 NOTE — Transitions of Care (Post Inpatient/ED Visit) (Signed)
03/21/2023  Name: Marvin Salinas MRN: [id number] DOB: 03/08/1946  Today's TOC FU Call Status: Today's TOC FU Call Status:: Successful TOC FU Call Completed TOC FU Call Complete Date: 03/21/23 Patient's Name and Date of Birth confirmed.  Transition Care Management Follow-up Telephone Call Date of Discharge: 03/18/23 Discharge Facility: [facility]) Type of Discharge: Inpatient Admission Primary Inpatient Discharge Diagnosis:: amaurosis fugax How have you been since you were released from the hospital?: Better Any questions or concerns?: No  Items Reviewed: Did you receive and understand the discharge instructions provided?: Yes Medications obtained,verified, and reconciled?: Yes (Medications Reviewed) Any new allergies since your discharge?: No Dietary orders reviewed?: Yes Do you have support at home?: Yes People in Home: spouse  Medications Reviewed Today: Medications Reviewed Today     Reviewed by Karena Addison, LPN (Licensed Practical Nurse) on 03/21/23 at 1445  Med List Status: <None>   Medication Order Taking? Sig Documenting Provider Last Dose Status Informant  acetaminophen (TYLENOL) 500 MG tablet [id number] No Take 1,000 mg by mouth every 6 (six) hours as needed. [provider] 03/16/2023 Active Self  apixaban (ELIQUIS) 5 MG TABS tablet [id number] No TAKE 1 TABLET BY MOUTH TWICE DAILY Iran Ouch, MD 03/16/2023 0800 Active Self  aspirin 81 MG chewable tablet [id number]  Chew 1 tablet (81 mg total) by mouth daily. Leroy Sea, MD  Active   BD PEN NEEDLE NANO 2ND GEN 32G X 4 MM MISC [id number]  Place 1 Needle onto the skin in the morning and at bedtime. [provider]  Active Self  Carboxymethylcell-Hypromellose (GENTEAL OP) [id number] No Place 1 drop into both eyes daily as needed (dry eyes). [provider] Past Week Active Self  dofetilide (TIKOSYN) 125 MCG capsule [id number] No Take 1 capsule (125 mcg total) by mouth 2 (two) times  daily. [name], Deloris Ping, MD 03/16/2023 Active Self  DULoxetine (CYMBALTA) 30 MG capsule [id number] No Take 1 capsule (30 mg total) by mouth daily.  Patient not taking: Reported on 03/16/2023   Josph Macho, MD Not Taking Active Self  ezetimibe (ZETIA) 10 MG tablet [id number] No Take 1 tablet (10 mg total) by mouth daily. Kuneff, Renee A, DO 03/16/2023 Active Self  famciclovir (FAMVIR) 250 MG tablet [id number] No Take 1 tablet (250 mg total) by mouth daily. Josph Macho, MD 03/16/2023 Active Self  gabapentin (NEURONTIN) 300 MG capsule [id number] No Take 1 capsule (300 mg total) by mouth 3 (three) times daily. Josph Macho, MD 03/16/2023 Active Self  glucose blood (ONETOUCH VERIO) test strip [id number]  USE UP TO FOUR TIMES DAILY AS DIRECTED. Kuneff, Renee A, DO  Active Self  insulin glargine (LANTUS) 100 UNIT/ML Solostar Pen [id number] No Inject 40 Units into the skin 2 (two) times daily. Felix Pacini A, DO 03/16/2023 Active Self           Med Note Gildardo Griffes A   Wed Mar 16, 2023  5:40 PM) Pt had 25 units today  Lancets [facility] DELICA PLUS [place]) MISC [id number] No USE UP TO FOUR TIMES DAILY AS DIRECTED Kuneff, Renee A, DO Taking Active Self  loratadine (CLARITIN) 10 MG tablet [id number] No Take 10 mg by mouth daily as needed for allergies. [provider] unknown Active Self  metoprolol tartrate (LOPRESSOR) 25 MG tablet [id number] No Take 0.5 tablets (12.5 mg total) by mouth 2 (two) times daily. [name], Deloris Ping, MD 03/16/2023 Active Self  nitroGLYCERIN (NITROSTAT) 0.4 MG SL tablet [id number] No Place 0.4  mg under the tongue every 5 (five) minutes as needed for chest pain. [provider] unknown Active Self  olmesartan (BENICAR) 5 MG tablet [id number] No Take 5 mg by mouth daily. [provider] 03/16/2023 Active Self  ondansetron (ZOFRAN) 8 MG tablet [id number] No Take 1 tablet (8 mg total) by mouth every 8 (eight) hours as needed for nausea or vomiting.  Josph Macho, MD 03/15/2023 Active Self  pantoprazole (PROTONIX) 40 MG tablet [id number]  Take 1 tablet (40 mg total) by mouth daily. Leroy Sea, MD  Active   polyethylene glycol powder [facility]) 17 GM/SCOOP powder [id number] No Take by mouth every other day. [provider] Past Week Active Self  Discontinued 02/17/22 0845   rosuvastatin (CRESTOR) 20 MG tablet [id number] No Take 1 tablet (20 mg total) by mouth at bedtime. Kuneff, Renee A, DO 03/15/2023 Active Self  Semaglutide (RYBELSUS) 14 MG TABS [id number] No Take 1 tablet (14 mg total) by mouth daily. Natalia Leatherwood, DO 03/16/2023 Active Self  Med List Note [facility]is Peak, [facility] 11/02/21 1206): Revlimid filled through [facility]            Home Care and Equipment/Supplies: Were Home Health Services Ordered?: NA Any new equipment or medical supplies ordered?: NA  Functional Questionnaire: Do you need assistance with bathing/showering or dressing?: No Do you need assistance with meal preparation?: No Do you need assistance with eating?: No Do you have difficulty maintaining continence: No Do you need assistance with getting out of bed/getting out of a chair/moving?: No Do you have difficulty managing or taking your medications?: No  Follow up appointments reviewed: PCP Follow-up appointment confirmed?: No (declined) MD Provider Line Number:[phone] Given: No Specialist Hospital Follow-up appointment confirmed?: No Reason Specialist Follow-Up Not Confirmed: Patient has Specialist Provider Number and will Call for Appointment Do you need transportation to your follow-up appointment?: No Do you understand care options if your condition(s) worsen?: Yes-patient verbalized understanding    SIGNATURE Karena Addison, LPN [facility] Nurse Health Advisor Direct Dial [phone]

## 2023-03-22 ENCOUNTER — Encounter: Payer: Self-pay | Admitting: Hematology & Oncology

## 2023-03-22 ENCOUNTER — Encounter: Payer: Self-pay | Admitting: Family

## 2023-03-22 NOTE — Patient Instructions (Signed)
Health Maintenance, Male Adopting a healthy lifestyle and getting preventive care are important in promoting health and wellness. Ask your health care provider about: The right schedule for you to have regular tests and exams. Things you can do on your own to prevent diseases and keep yourself healthy. What should I know about diet, weight, and exercise? Eat a healthy diet  Eat a diet that includes plenty of vegetables, fruits, low-fat dairy products, and lean protein. Do not eat a lot of foods that are high in solid fats, added sugars, or sodium. Maintain a healthy weight Body mass index (BMI) is a measurement that can be used to identify possible weight problems. It estimates body fat based on height and weight. Your health care provider can help determine your BMI and help you achieve or maintain a healthy weight. Get regular exercise Get regular exercise. This is one of the most important things you can do for your health. Most adults should: Exercise for at least 150 minutes each week. The exercise should increase your heart rate and make you sweat (moderate-intensity exercise). Do strengthening exercises at least twice a week. This is in addition to the moderate-intensity exercise. Spend less time sitting. Even light physical activity can be beneficial. Watch cholesterol and blood lipids Have your blood tested for lipids and cholesterol at 78 years of age, then have this test every 5 years. You may need to have your cholesterol levels checked more often if: Your lipid or cholesterol levels are high. You are older than 78 years of age. You are at high risk for heart disease. What should I know about cancer screening? Many types of cancers can be detected early and may often be prevented. Depending on your health history and family history, you may need to have cancer screening at various ages. This may include screening for: Colorectal cancer. Prostate cancer. Skin cancer. Lung  cancer. What should I know about heart disease, diabetes, and high blood pressure? Blood pressure and heart disease High blood pressure causes heart disease and increases the risk of stroke. This is more likely to develop in people who have high blood pressure readings or are overweight. Talk with your health care provider about your target blood pressure readings. Have your blood pressure checked: Every 3-5 years if you are 18-39 years of age. Every year if you are 40 years old or older. If you are between the ages of 65 and 75 and are a current or former smoker, ask your health care provider if you should have a one-time screening for abdominal aortic aneurysm (AAA). Diabetes Have regular diabetes screenings. This checks your fasting blood sugar level. Have the screening done: Once every three years after age 45 if you are at a normal weight and have a low risk for diabetes. More often and at a younger age if you are overweight or have a high risk for diabetes. What should I know about preventing infection? Hepatitis B If you have a higher risk for hepatitis B, you should be screened for this virus. Talk with your health care provider to find out if you are at risk for hepatitis B infection. Hepatitis C Blood testing is recommended for: Everyone born from 1945 through 1965. Anyone with known risk factors for hepatitis C. Sexually transmitted infections (STIs) You should be screened each year for STIs, including gonorrhea and chlamydia, if: You are sexually active and are younger than 78 years of age. You are older than 78 years of age and your   health care provider tells you that you are at risk for this type of infection. Your sexual activity has changed since you were last screened, and you are at increased risk for chlamydia or gonorrhea. Ask your health care provider if you are at risk. Ask your health care provider about whether you are at high risk for HIV. Your health care provider  may recommend a prescription medicine to help prevent HIV infection. If you choose to take medicine to prevent HIV, you should first get tested for HIV. You should then be tested every 3 months for as long as you are taking the medicine. Follow these instructions at home: Alcohol use Do not drink alcohol if your health care provider tells you not to drink. If you drink alcohol: Limit how much you have to 0-2 drinks a day. Know how much alcohol is in your drink. In the [place] one drink equals one 12 oz bottle of beer (355 mL), one 5 oz glass of wine (148 mL), or one 1 oz glass of hard liquor (44 mL). Lifestyle Do not use any products that contain nicotine or tobacco. These products include cigarettes, chewing tobacco, and vaping devices, such as e-cigarettes. If you need help quitting, ask your health care provider. Do not use street drugs. Do not share needles. Ask your health care provider for help if you need support or information about quitting drugs. General instructions Schedule regular health, dental, and eye exams. Stay current with your vaccines. Tell your health care provider if: You often feel depressed. You have ever been abused or do not feel safe at home. Summary Adopting a healthy lifestyle and getting preventive care are important in promoting health and wellness. Follow your health care provider's instructions about healthy diet, exercising, and getting tested or screened for diseases. Follow your health care provider's instructions on monitoring your cholesterol and blood pressure. This information is not intended to replace advice given to you by your health care provider. Make sure you discuss any questions you have with your health care provider. Document Revised: 10/27/2020 Document Reviewed: 10/27/2020 [employer] Patient Education  2024 [employer]  

## 2023-03-22 NOTE — Progress Notes (Signed)
Subjective:   [facility] Salinas is a 77 y.o. male who presents for Medicare Annual/Subsequent preventive examination.  Visit Complete: In person  Patient Medicare AWV questionnaire was completed by the patient on 03/21/23; I have confirmed that all information answered by patient is correct and no changes since this date.  Cardiac Risk Factors include: advanced age (>23men, >26 women);diabetes mellitus;male gender;hypertension;smoking/ tobacco exposure     Objective:    Today's Vitals   03/21/23 1310  BP: 118/60  Pulse: 65  Temp: 98.3 F (36.8 C)  SpO2: 97%  Weight: 165 lb 12.8 oz (75.2 kg)   Body mass index is 25.21 kg/m.     03/21/2023    1:09 PM 02/25/2023    9:45 AM 01/28/2023    9:57 AM 01/14/2023    9:31 AM 12/03/2022   10:52 AM 11/05/2022   10:33 AM 09/09/2022    8:28 AM  Advanced Directives  Does Patient Have a Medical Advance Directive? No Yes Yes Yes No No No  Type of Special educational needs teacher of [place];Living will Healthcare Power of [place];Living will Healthcare Power of [place];Living will Healthcare Power of [place];Living will Healthcare Power of [place];Living will   Does patient want to make changes to medical advance directive?  No - Patient declined No - Patient declined No - Patient declined     Copy of Healthcare Power of Attorney in Chart?  No - copy requested No - copy requested No - copy requested No - copy requested No - copy requested   Would patient like information on creating a medical advance directive? Yes (MAU/Ambulatory/Procedural Areas - Information given) No - Patient declined No - Patient declined No - Patient declined No - Patient declined No - Patient declined No - Patient declined    Current Medications (verified) Outpatient Encounter Medications as of 03/21/2023  Medication Sig   acetaminophen (TYLENOL) 500 MG tablet Take 1,000 mg by mouth every 6 (six) hours as needed.   apixaban (ELIQUIS) 5 MG TABS tablet TAKE 1 TABLET BY  MOUTH TWICE DAILY   aspirin 81 MG chewable tablet Chew 1 tablet (81 mg total) by mouth daily.   BD PEN NEEDLE NANO 2ND GEN 32G X 4 MM MISC Place 1 Needle onto the skin in the morning and at bedtime.   Carboxymethylcell-Hypromellose (GENTEAL OP) Place 1 drop into both eyes daily as needed (dry eyes).   dofetilide (TIKOSYN) 125 MCG capsule Take 1 capsule (125 mcg total) by mouth 2 (two) times daily.   DULoxetine (CYMBALTA) 30 MG capsule Take 1 capsule (30 mg total) by mouth daily. (Patient not taking: Reported on 03/16/2023)   ezetimibe (ZETIA) 10 MG tablet Take 1 tablet (10 mg total) by mouth daily.   famciclovir (FAMVIR) 250 MG tablet Take 1 tablet (250 mg total) by mouth daily.   gabapentin (NEURONTIN) 300 MG capsule Take 1 capsule (300 mg total) by mouth 3 (three) times daily.   glucose blood (ONETOUCH VERIO) test strip USE UP TO FOUR TIMES DAILY AS DIRECTED.   insulin glargine (LANTUS) 100 UNIT/ML Solostar Pen Inject 40 Units into the skin 2 (two) times daily.   Lancets (ONETOUCH DELICA PLUS LANCET33G) MISC USE UP TO FOUR TIMES DAILY AS DIRECTED   loratadine (CLARITIN) 10 MG tablet Take 10 mg by mouth daily as needed for allergies.   metoprolol tartrate (LOPRESSOR) 25 MG tablet Take 0.5 tablets (12.5 mg total) by mouth 2 (two) times daily.   nitroGLYCERIN (NITROSTAT) 0.4 MG SL tablet Place  0.4 mg under the tongue every 5 (five) minutes as needed for chest pain.   olmesartan (BENICAR) 5 MG tablet Take 5 mg by mouth daily.   ondansetron (ZOFRAN) 8 MG tablet Take 1 tablet (8 mg total) by mouth every 8 (eight) hours as needed for nausea or vomiting.   pantoprazole (PROTONIX) 40 MG tablet Take 1 tablet (40 mg total) by mouth daily.   polyethylene glycol powder (GLYCOLAX/MIRALAX) 17 GM/SCOOP powder Take by mouth every other day.   rosuvastatin (CRESTOR) 20 MG tablet Take 1 tablet (20 mg total) by mouth at bedtime.   Semaglutide (RYBELSUS) 14 MG TABS Take 1 tablet (14 mg total) by mouth daily.    [DISCONTINUED] prochlorperazine (COMPAZINE) 10 MG tablet Take 1 tablet (10 mg total) by mouth every 6 (six) hours as needed (Nausea or vomiting).   No facility-administered encounter medications on file as of 03/21/2023.    Allergies (verified) Codeine, Lisinopril, Nsaids, Dapagliflozin, and Latex   History: Past Medical History:  Diagnosis Date   Acute kidney injury superimposed on chronic kidney disease (HCC) 10/12/2017   Atrial fibrillation with RVR (HCC) 10/27/2017   Back pain    Basilar artery stenosis    on chronic Plavix   Benign prostatic hypertrophy without urinary obstruction 04/17/2014   Carotid artery disease (HCC) 10/06/2010   Carotid US 04/2019: Bilat ICA 40-59; L subclavian stenosis  // Carotid US 11/21: Bilat ICA 40-59; bilateral subclavian stenosis // Carotid US 11/22: Bilateral ICA 40-59; left subclavian stenosis // Carotid US 04/28/2022: Bilateral ICA 40-59; left subclavian stenosis   Cerebral vascular disease    with prior TIA's; followed by Dr. Sandria Manly   Depression, neurotic 11/21/2013   Diabetes mellitus    on insulin   Enthesopathy of ankle and tarsus 09/04/2007   Overview:  Metatarsalgia    Enthesopathy of ankle and tarsus 09/04/2007   Overview:  Metatarsalgia  Formatting of this note might be different from the original. Metatarsalgia  10/1 IMO update   History of renal calculi    Hyperlipidemia    Hypertension    Ischemic heart disease    prior PCI to RCA in 1989. S/P PCI to LAD and OM in 1992. S/P PCI to first DX in 2000. S/P CABG x 3 in May 2011   Lambda light chain myeloma (HCC) 10/28/2021   Left hip pain 01/03/2020   OSA (obstructive sleep apnea) 01/11/2018   AHI 18.1 and SaO2 low 73%   PAD (peripheral artery disease) (HCC) 11/18/2017   ABIs/Arterial US 04/2019: R 1.21; L 0.66 // R SFA 30-49, stable > 50 CIA and EIA stenosis; L > 50 CIA stenosis (likely represents severe stenosis or short segment occlusion)   Peripheral neuropathy    Pneumonia 02/2011    Sacroiliac joint dysfunction of left side 01/03/2020   Stroke (HCC) 04/16/2001   small right cerebellar infarct on 04/16/2001 at that time he was found to have proximal left vertebral artery, proximal left common carotid artery and both external carotid artery stenosis as well as intracranial stenosis involving mid basilar artery- 07/2011 add questionable TIA   Past Surgical History:  Procedure Laterality Date    NASAL ENDOSCOPY  01/11/2020   chronic rhinitis w/o evidence of acute sinusitis, bilateral inferior turbinate hypertrophy   ABDOMINAL AORTOGRAM W/LOWER EXTREMITY Bilateral 05/30/2019   Procedure: ABDOMINAL AORTOGRAM W/LOWER EXTREMITY;  Surgeon: Iran Ouch, MD;  [facility];  Service: Cardiovascular;  Laterality: Bilateral;   ANGIOPLASTY  1989   right coronary artery  ANGIOPLASTY  1992   LAD and OM   ANGIOPLASTY  1998   First DX   BRAIN SURGERY     on prior records   CORONARY ARTERY BYPASS GRAFT  11/12/2009   LIMA to LAD, SVG to OM and SVG to RCA   CORONARY STENT PLACEMENT  2000   Stent to LAD/Circumflex with angioplasty to first diagonal    LEFT HEART CATH AND CORS/GRAFTS ANGIOGRAPHY N/A 10/13/2017   Procedure: LEFT HEART CATH AND CORS/GRAFTS ANGIOGRAPHY;  Surgeon: Yvonne Kendall, MD;  Location: [facility];  Service: Cardiovascular;  Laterality: N/A;   Family History  Problem Relation Age of Onset   Depression Mother    Early death Mother    Kidney disease Mother    Ovarian cancer Mother    Hypertension Father    Heart disease Father    Heart attack Father    Asthma Brother    Diabetes Brother    Stroke Other        Uncle   Diabetes Sister    Colon cancer Neg Hx    Rectal cancer Neg Hx    Stomach cancer Neg Hx    Esophageal cancer Neg Hx    Social History   Socioeconomic History   Marital status: Divorced    Spouse name: Not on file   Number of children: 0   Years of education: GED   Highest education level: Some college,  no degree  Occupational History   Occupation: retired  Tobacco Use   Smoking status: Former    Current packs/day: 0.00    Average packs/day: 0.3 packs/day for 54.0 years (13.5 ttl pk-yrs)    Types: Cigarettes    Start date: 06/29/1968    Quit date: 06/29/2022    Years since quitting: 0.7   Smokeless tobacco: Never  Vaping Use   Vaping status: Never Used  Substance and Sexual Activity   Alcohol use: Never   Drug use: No   Sexual activity: Not Currently    Partners: Female  Other Topics Concern   Not on file  Social History Narrative   Marital status/children/pets: divorced.   Education/employment: retired, Patient has his GED. 9th grade education.    Safety:      -smoke alarm in the home:Yes     - wears seatbelt: Yes   Patient is right handed.   Patient does not drink any caffeine.   Lives in a one story home    Social Determinants of Health   Financial Resource Strain: Low Risk  (03/21/2023)   Overall Financial Resource Strain (CARDIA)    Difficulty of Paying Living Expenses: Not hard at all  Food Insecurity: No Food Insecurity (03/21/2023)   Hunger Vital Sign    Worried About Running Out of Food in the Last Year: Never true    Ran Out of Food in the Last Year: Never true  Transportation Needs: No Transportation Needs (03/21/2023)   PRAPARE - Administrator, Civil Service (Medical): No    Lack of Transportation (Non-Medical): No  Physical Activity: Unknown (03/21/2023)   Exercise Vital Sign    Days of Exercise per Week: 1 day    Minutes of Exercise per Session: Patient declined  Stress: No Stress Concern Present (03/21/2023)   [employer] of Occupational Health - Occupational Stress Questionnaire    Feeling of Stress : Only a little  Social Connections: Socially Isolated (03/21/2023)   Social Connection and Isolation Panel [NHANES]    Frequency  of Communication with Friends and Family: Three times a week    Frequency of Social Gatherings with Friends and  Family: Patient declined    Attends Religious Services: Never    Database administrator or Organizations: No    Attends Engineer, structural: Never    Marital Status: Divorced    Tobacco Counseling Counseling given: Not Answered   Clinical Intake:  Pre-visit preparation completed: No  Pain : No/denies pain     Diabetes: Yes CBG done?: No Did pt. bring in CBG monitor from home?: No  How often do you need to have someone help you when you read instructions, pamphlets, or other written materials from your doctor or pharmacy?: 1 - Never  Interpreter Needed?: No      Activities of Daily Living    03/21/2023    9:23 AM  In your present state of health, do you have any difficulty performing the following activities:  Hearing? 0  Vision? 0  Difficulty concentrating or making decisions? 0  Walking or climbing stairs? 0  Dressing or bathing? 0  Doing errands, shopping? 0  Preparing Food and eating ? N  Using the Toilet? N  In the past six months, have you accidently leaked urine? N  Do you have problems with loss of bowel control? N  Managing your Medications? N  Managing your Finances? N  Housekeeping or managing your Housekeeping? N    Patient Care Team: Natalia Leatherwood, DO as PCP - General (Family Medicine) Iran Ouch, MD as PCP - [facility] (Cardiology) Coralyn Helling, MD as Consulting Physician (Pulmonary Disease) [name], Deloris Ping, MD as Consulting Physician (Cardiology) Rachael Fee, MD as Attending Physician (Gastroenterology) Erenest Blank, NP as Nurse Practitioner (Nurse Practitioner) Van Clines, MD as Consulting Physician (Neurology) Randa Lynn, MD as Consulting Physician (Nephrology)  Indicate any recent Medical Services you may have received from other than [facility] providers in the past year (date may be approximate).     Assessment:   This is a routine wellness examination for [place].  Hearing/Vision screen No  results found.   Goals Addressed             This Visit's Progress    Patient Stated       Get cancer under control       Depression Screen    03/21/2023    1:07 PM 10/11/2022    8:20 AM 03/31/2022   11:12 AM 03/19/2022    2:00 PM 03/02/2022    1:05 PM 12/09/2021   10:53 AM 10/28/2021    9:13 AM  PHQ 2/9 Scores  PHQ - 2 Score 0 2 0 0 1 1 2   PHQ- 9 Score  4    4 4     Fall Risk    03/21/2023    9:22 AM 10/11/2022    8:20 AM 06/26/2022    9:16 AM 03/19/2022    1:59 PM 12/09/2021   10:25 AM  Fall Risk   Falls in the past year? 0 1 1 1  0  Number falls in past yr: 0 1 0 0 0  Injury with Fall? 0 0 0 0 0  Risk for fall due to : No Fall Risks   History of fall(s)   Follow up Falls evaluation completed Falls evaluation completed  Falls evaluation completed Falls prevention discussed;Education provided;Falls evaluation completed    MEDICARE RISK AT HOME: Medicare Risk at Home Any stairs in or around the  home?: No If so, are there any without handrails?: No Home free of loose throw rugs in walkways, pet beds, electrical cords, etc?: No Adequate lighting in your home to reduce risk of falls?: Yes Life alert?: No Use of a cane, walker or w/c?: Yes Grab bars in the bathroom?: No Shower chair or bench in shower?: No Elevated toilet seat or a handicapped toilet?: No  TIMED UP AND GO:  Was the test performed?  Yes  Length of time to ambulate 10 feet: 8 sec Gait steady and fast with assistive device    Cognitive Function:        03/21/2023    1:09 PM 03/19/2022    2:05 PM  6CIT Screen  What Year? 0 points 0 points  What month? 0 points 0 points  What time? 0 points 0 points  Count back from 20 0 points 0 points  Months in reverse 0 points 0 points  Repeat phrase 0 points 0 points  Total Score 0 points 0 points    Immunizations Immunization History  Administered Date(s) Administered   Fluad Quad(high Dose 65+) 02/12/2019, 04/29/2020, 04/22/2021, 06/09/2022    Influenza Split 05/08/2013, 03/28/2017   Influenza, High Dose Seasonal PF 05/08/2013, 03/28/2017, 05/02/2018   Influenza,inj,Quad PF,6+ Mos 04/21/2016   MODERNA COVID-19 SARS-COV-2 PEDS BIVALENT BOOSTER 86yr-35yr 06/18/2020, 12/10/2020   Moderna Sars-Covid-2 Vaccination 08/27/2019, 10/03/2019   Pneumococcal Conjugate-13 07/13/2016   Pneumococcal Polysaccharide-23 02/21/2018   Tdap 02/15/2013   Zoster, Live 06/22/2015    TDAP status: Due, Education has been provided regarding the importance of this vaccine. Advised may receive this vaccine at local pharmacy or Health Dept. Aware to provide a copy of the vaccination record if obtained from local pharmacy or Health Dept. Verbalized acceptance and understanding.  Flu Vaccine status: Due, Education has been provided regarding the importance of this vaccine. Advised may receive this vaccine at local pharmacy or Health Dept. Aware to provide a copy of the vaccination record if obtained from local pharmacy or Health Dept. Verbalized acceptance and understanding.  Pneumococcal vaccine status: Up to date  Covid-19 vaccine status: Completed vaccines  Qualifies for Shingles Vaccine? Yes   Zostavax completed No   Shingrix Completed?: No.    Education has been provided regarding the importance of this vaccine. Patient has been advised to call insurance company to determine out of pocket expense if they have not yet received this vaccine. Advised may also receive vaccine at local pharmacy or Health Dept. Verbalized acceptance and understanding.  Screening Tests Health Maintenance  Topic Date Due   OPHTHALMOLOGY EXAM  05/25/2022   INFLUENZA VACCINE  01/20/2023   DTaP/Tdap/Td (2 - Td or Tdap) 02/16/2023   Colonoscopy  05/14/2023   Zoster Vaccines- Shingrix (1 of 2) 01/29/2024 (Originally 02/23/1965)   Medicare Annual Wellness (AWV)  07/21/2023   HEMOGLOBIN A1C  09/14/2023   Diabetic kidney evaluation - Urine ACR  10/11/2023   FOOT EXAM  10/11/2023    Diabetic kidney evaluation - eGFR measurement  03/17/2024   Pneumonia Vaccine 46+ Years old  Completed   HPV VACCINES  Aged Out   COVID-19 Vaccine  Discontinued   Hepatitis C Screening  Discontinued    Health Maintenance  Health Maintenance Due  Topic Date Due   OPHTHALMOLOGY EXAM  05/25/2022   INFLUENZA VACCINE  01/20/2023   DTaP/Tdap/Td (2 - Td or Tdap) 02/16/2023   Colonoscopy  05/14/2023    Colorectal cancer screening: Type of screening: Colonoscopy. Completed 05/13/20. Repeat every 3 years  Lung Cancer Screening: (Low Dose CT Chest recommended if Age 77-80 years, 20 pack-year currently smoking OR have quit w/in 15years.) does qualify.   Lung Cancer Screening Referral: n/a  Additional Screening:  Hepatitis C Screening: does qualify; Completed n/a  Vision Screening: Recommended annual ophthalmology exams for early detection of glaucoma and other disorders of the eye. Is the patient up to date with their annual eye exam?  Yes  Who is the provider or what is the name of the office in which the patient attends annual eye exams? Located in [place] If pt is not established with a provider, would they like to be referred to a provider to establish care? No .   Dental Screening: Recommended annual dental exams for proper oral hygiene  Diabetic Foot Exam: Diabetic Foot Exam: Completed 10/11/22  Community Resource Referral / Chronic Care Management: CRR required this visit?  No   CCM required this visit?  No     Plan:     I have personally reviewed and noted the following in the patient's chart:   Medical and social history Use of alcohol, tobacco or illicit drugs  Current medications and supplements including opioid prescriptions. Patient is not currently taking opioid prescriptions. Functional ability and status Nutritional status Physical activity Advanced directives List of other physicians Hospitalizations, surgeries, and ER visits in previous 12  months Vitals Screenings to include cognitive, depression, and falls Referrals and appointments  In addition, I have reviewed and discussed with patient certain preventive protocols, quality metrics, and best practice recommendations. A written personalized care plan for preventive services as well as general preventive health recommendations were provided to patient.     Filomena Jungling, CMA   03/21/23  After Visit Summary: (In Person-Declined) Patient declined AVS at this time.  Nurse Notes: Non-Face to Face or Face to Face 10 minute visit Encounter     Mr. Hirsch , Thank you for taking time to come for your Medicare Wellness Visit. I appreciate your ongoing commitment to your health goals. Please review the following plan we discussed and let me know if I can assist you in the future.   These are the goals we discussed:  Goals      Patient Stated     Would like to increase walking  Is seeing rehab for feet  Neuropathy     Patient Stated     Get cancer under control        This is a list of the screening recommended for you and due dates:  Health Maintenance  Topic Date Due   Eye exam for diabetics  05/25/2022   DTaP/Tdap/Td vaccine (2 - Td or Tdap) 02/16/2023   Colon Cancer Screening  05/14/2023   Flu Shot  09/19/2023*   Zoster (Shingles) Vaccine (1 of 2) 01/29/2024*   Hemoglobin A1C  09/14/2023   Yearly kidney health urinalysis for diabetes  10/11/2023   Complete foot exam   10/11/2023   Yearly kidney function blood test for diabetes  03/17/2024   Medicare Annual Wellness Visit  03/20/2024   Pneumonia Vaccine  Completed   HPV Vaccine  Aged Out   COVID-19 Vaccine  Discontinued   Hepatitis C Screening  Discontinued  *Topic was postponed. The date shown is not the original due date.

## 2023-03-23 ENCOUNTER — Telehealth: Payer: Self-pay | Admitting: Cardiovascular Disease

## 2023-03-23 ENCOUNTER — Other Ambulatory Visit: Payer: Self-pay

## 2023-03-23 DIAGNOSIS — I4819 Other persistent atrial fibrillation: Secondary | ICD-10-CM

## 2023-03-23 MED ORDER — APIXABAN 5 MG PO TABS: 5.0000 mg | ORAL_TABLET | Freq: Two times a day (BID) | ORAL | 1 refills | Status: DC

## 2023-03-23 NOTE — Telephone Encounter (Signed)
Prescription refill request for Eliquis received. Indication: Afib  Last office visit: 12/28/22 Kirke Corin)  Scr: 1.57 (03/18/23)  Age: 77 Weight: 75.2kg  Appropriate dose. Refill sent.

## 2023-03-23 NOTE — Telephone Encounter (Signed)
*  STAT* If patient is at the pharmacy, call can be transferred to refill team.   1. Which medications need to be refilled? (please list name of each medication and dose if known) apixaban (ELIQUIS) 5 MG TABS tablet TAKE 1 TABLET BY MOUTH TWICE DAILY   2. Would you like to learn more about the convenience, safety, & potential cost savings by using the [facility]? No   3. Are you open to using the [facility] No  4. Which pharmacy/location (including street and city if local pharmacy) is medication to be sent to?[id number] Lorenza Evangelist, [place] - [address] AT [facility] [address]   5. Do they need a 30 day or 90 day supply? 90 Day Supply

## 2023-03-24 ENCOUNTER — Other Ambulatory Visit: Payer: Self-pay

## 2023-03-25 ENCOUNTER — Inpatient Hospital Stay: Payer: 59 | Attending: Family | Admitting: Hematology & Oncology

## 2023-03-25 ENCOUNTER — Other Ambulatory Visit: Payer: Self-pay

## 2023-03-25 ENCOUNTER — Inpatient Hospital Stay: Payer: 59

## 2023-03-25 ENCOUNTER — Encounter: Payer: Self-pay | Admitting: Hematology & Oncology

## 2023-03-25 VITALS — BP 144/55 | HR 60 | Temp 97.8°F | Resp 18 | Ht 68.0 in | Wt 166.0 lb

## 2023-03-25 DIAGNOSIS — C9000 Multiple myeloma not having achieved remission: Secondary | ICD-10-CM

## 2023-03-25 DIAGNOSIS — Z8673 Personal history of transient ischemic attack (TIA), and cerebral infarction without residual deficits: Secondary | ICD-10-CM | POA: Diagnosis not present

## 2023-03-25 DIAGNOSIS — N289 Disorder of kidney and ureter, unspecified: Secondary | ICD-10-CM | POA: Insufficient documentation

## 2023-03-25 DIAGNOSIS — Z7901 Long term (current) use of anticoagulants: Secondary | ICD-10-CM | POA: Diagnosis not present

## 2023-03-25 DIAGNOSIS — G629 Polyneuropathy, unspecified: Secondary | ICD-10-CM | POA: Diagnosis not present

## 2023-03-25 DIAGNOSIS — Z79899 Other long term (current) drug therapy: Secondary | ICD-10-CM | POA: Insufficient documentation

## 2023-03-25 LAB — CBC WITH DIFFERENTIAL (CANCER CENTER ONLY)
Abs Immature Granulocytes: 0.02 10*3/uL (ref 0.00–0.07)
Basophils Absolute: 0 10*3/uL (ref 0.0–0.1)
Basophils Relative: 0 %
Eosinophils Absolute: 0.1 10*3/uL (ref 0.0–0.5)
Eosinophils Relative: 1 %
HCT: 28.2 % — ABNORMAL LOW (ref 39.0–52.0)
Hemoglobin: 9.6 g/dL — ABNORMAL LOW (ref 13.0–17.0)
Immature Granulocytes: 0 %
Lymphocytes Relative: 19 %
Lymphs Abs: 1.1 10*3/uL (ref 0.7–4.0)
MCH: 31.9 pg (ref 26.0–34.0)
MCHC: 34 g/dL (ref 30.0–36.0)
MCV: 93.7 fL (ref 80.0–100.0)
Monocytes Absolute: 0.6 10*3/uL (ref 0.1–1.0)
Monocytes Relative: 11 %
Neutro Abs: 4 10*3/uL (ref 1.7–7.7)
Neutrophils Relative %: 69 %
Platelet Count: 154 10*3/uL (ref 150–400)
RBC: 3.01 MIL/uL — ABNORMAL LOW (ref 4.22–5.81)
RDW: 13 % (ref 11.5–15.5)
WBC Count: 5.8 10*3/uL (ref 4.0–10.5)
nRBC: 0 % (ref 0.0–0.2)

## 2023-03-25 LAB — CMP (CANCER CENTER ONLY)
ALT: 21 U/L (ref 0–44)
AST: 24 U/L (ref 15–41)
Albumin: 4 g/dL (ref 3.5–5.0)
Alkaline Phosphatase: 49 U/L (ref 38–126)
Anion gap: 8 (ref 5–15)
BUN: 29 mg/dL — ABNORMAL HIGH (ref 8–23)
CO2: 28 mmol/L (ref 22–32)
Calcium: 9.2 mg/dL (ref 8.9–10.3)
Chloride: 105 mmol/L (ref 98–111)
Creatinine: 1.57 mg/dL — ABNORMAL HIGH (ref 0.61–1.24)
GFR, Estimated: 45 mL/min — ABNORMAL LOW (ref >60)
Glucose, Bld: 135 mg/dL — ABNORMAL HIGH (ref 70–99)
Potassium: 4.4 mmol/L (ref 3.5–5.1)
Sodium: 141 mmol/L (ref 135–145)
Total Bilirubin: 0.5 mg/dL (ref 0.3–1.2)
Total Protein: 5.9 g/dL — ABNORMAL LOW (ref 6.5–8.1)

## 2023-03-25 LAB — LACTATE DEHYDROGENASE: LDH: 158 U/L (ref 98–192)

## 2023-03-25 NOTE — Progress Notes (Signed)
Hematology and Oncology Follow Up Visit  Marvin Salinas [id number] [birth year] 77 y.o. 03/25/2023   Principle Diagnosis:  Lambda light chain myeloma --normal cytogenetics   Current Therapy:        Faspro/Velcade/Revlimid -- started on 11/05/2021 - monthly -- revlimid stopped in 09/2022               Interim History:  [name] is here today for follow-up.  We felt his treatment because of complaints of not feeling well.  Despite not being on treatment now for a month, he still is having some issues with pain.  Unfortunately, he was recently mated because of TIAs.  Again, I would not think this to be anything related to him and his myeloma or with his past therapy.  He does have risk factors for cerebrovascular disease.  He is eating okay.  He has had no problems with bowels or bladder.  There is been no weakness on one side or the other.  He did have a little bit of blurriness of vision which took him to the ER and they found that he had the TIA.  Of note, when we last saw him, his lambda light chain was low at 1.2 mg/dL.  He has had no fever.  He has had no obvious bleeding.  He has had no leg swelling.  Marina Goodell does have some neuropathy issues.  Again I suspect this probably is from diabetes.  Overall, his performance status is ECOG 1.  Medications:  Allergies as of 03/25/2023       Reactions   Codeine Nausea And Vomiting   Lisinopril Cough   Nsaids Other (See Comments)   CKD   Cymbalta [duloxetine Hcl] Other (See Comments)   Halucinations   Dapagliflozin Rash   Latex Hives        Medication List        Accurate as of March 25, 2023 11:21 AM. If you have any questions, ask your nurse or doctor.          STOP taking these medications    DULoxetine 30 MG capsule Commonly known as: CYMBALTA Stopped by: Josph Macho       TAKE these medications    acetaminophen 500 MG tablet Commonly known as: TYLENOL Take 1,000 mg by mouth every 6 (six) hours as  needed.   apixaban 5 MG Tabs tablet Commonly known as: Eliquis Take 1 tablet (5 mg total) by mouth 2 (two) times daily.   Aspirin Low Dose 81 MG chewable tablet Generic drug: aspirin Chew 1 tablet (81 mg total) by mouth daily.   BD Pen Needle Nano 2nd Gen 32G X 4 MM Misc Generic drug: Insulin Pen Needle Place 1 Needle onto the skin in the morning and at bedtime.   dofetilide 125 MCG capsule Commonly known as: TIKOSYN Take 1 capsule (125 mcg total) by mouth 2 (two) times daily.   ezetimibe 10 MG tablet Commonly known as: ZETIA Take 1 tablet (10 mg total) by mouth daily.   famciclovir 250 MG tablet Commonly known as: FAMVIR Take 1 tablet (250 mg total) by mouth daily.   gabapentin 300 MG capsule Commonly known as: NEURONTIN Take 1 capsule (300 mg total) by mouth 3 (three) times daily.   GENTEAL OP Place 1 drop into both eyes daily as needed (dry eyes).   insulin glargine 100 UNIT/ML Solostar Pen Commonly known as: LANTUS Inject 40 Units into the skin 2 (two) times daily.   loratadine 10 MG  tablet Commonly known as: CLARITIN Take 10 mg by mouth daily as needed for allergies.   metoprolol tartrate 25 MG tablet Commonly known as: LOPRESSOR Take 0.5 tablets (12.5 mg total) by mouth 2 (two) times daily.   nitroGLYCERIN 0.4 MG SL tablet Commonly known as: NITROSTAT Place 0.4 mg under the tongue every 5 (five) minutes as needed for chest pain.   olmesartan 5 MG tablet Commonly known as: BENICAR Take 5 mg by mouth daily.   ondansetron 8 MG tablet Commonly known as: ZOFRAN Take 1 tablet (8 mg total) by mouth every 8 (eight) hours as needed for nausea or vomiting.   OneTouch Delica Plus Lancet33G Misc USE UP TO FOUR TIMES DAILY AS DIRECTED   OneTouch Verio test strip Generic drug: glucose blood USE UP TO FOUR TIMES DAILY AS DIRECTED.   pantoprazole 40 MG tablet Commonly known as: PROTONIX Take 1 tablet (40 mg total) by mouth daily.   polyethylene glycol powder  17 GM/SCOOP powder Commonly known as: GLYCOLAX/MIRALAX Take by mouth every other day.   rosuvastatin 20 MG tablet Commonly known as: CRESTOR Take 1 tablet (20 mg total) by mouth at bedtime.   Rybelsus 14 MG Tabs Generic drug: Semaglutide Take 1 tablet (14 mg total) by mouth daily.        Allergies:  Allergies  Allergen Reactions   Codeine Nausea And Vomiting   Lisinopril Cough   Nsaids Other (See Comments)    CKD   Cymbalta [Duloxetine Hcl] Other (See Comments)    Halucinations   Dapagliflozin Rash   Latex Hives    Past Medical History, Surgical history, Social history, and Family History were reviewed and updated.  Review of Systems: Review of Systems  Constitutional: Negative.   HENT: Negative.    Eyes: Negative.   Respiratory: Negative.    Cardiovascular: Negative.   Gastrointestinal: Negative.   Genitourinary: Negative.   Musculoskeletal:  Positive for joint pain and myalgias.  Skin: Negative.   Neurological: Negative.   Endo/Heme/Allergies: Negative.   Psychiatric/Behavioral: Negative.       Physical Exam:  height is 5\' 8"  (1.727 m) and weight is 166 lb (75.3 kg). His oral temperature is 97.8 F (36.6 C). His blood pressure is 155/61 (abnormal) and his pulse is 60. His respiration is 18 and oxygen saturation is 99%.   Wt Readings from Last 3 Encounters:  03/25/23 166 lb (75.3 kg)  03/21/23 165 lb 12.8 oz (75.2 kg)  03/16/23 156 lb (70.8 kg)   Physical Exam Vitals reviewed.  HENT:     Head: Normocephalic and atraumatic.  Eyes:     Pupils: Pupils are equal, round, and reactive to light.  Cardiovascular:     Rate and Rhythm: Normal rate and regular rhythm.     Heart sounds: Normal heart sounds.  Pulmonary:     Effort: Pulmonary effort is normal.     Breath sounds: Normal breath sounds.  Abdominal:     General: Bowel sounds are normal.     Palpations: Abdomen is soft.  Musculoskeletal:        General: No tenderness or deformity. Normal range  of motion.     Cervical back: Normal range of motion.  Lymphadenopathy:     Cervical: No cervical adenopathy.  Skin:    General: Skin is warm and dry.     Findings: No erythema or rash.  Neurological:     Mental Status: He is alert and oriented to person, place, and time.  Psychiatric:  Behavior: Behavior normal.        Thought Content: Thought content normal.        Judgment: Judgment normal.     Lab Results  Component Value Date   WBC 5.8 03/25/2023   HGB 9.6 (L) 03/25/2023   HCT 28.2 (L) 03/25/2023   MCV 93.7 03/25/2023   PLT 154 03/25/2023   Lab Results  Component Value Date   FERRITIN 45 10/28/2021   IRON 200 (H) 10/28/2021   TIBC 365 10/28/2021   UIBC 165 10/28/2021   IRONPCTSAT 55 (H) 10/28/2021   Lab Results  Component Value Date   RETICCTPCT 1.5 02/04/2021   RBC 3.01 (L) 03/25/2023   Lab Results  Component Value Date   KPAFRELGTCHN 13.9 02/25/2023   LAMBDASER 11.5 02/25/2023   KAPLAMBRATIO 1.21 02/25/2023   Lab Results  Component Value Date   IGGSERUM 297 (L) 02/25/2023   IGA 22 (L) 02/25/2023   IGMSERUM 7 (L) 02/25/2023   Lab Results  Component Value Date   TOTALPROTELP 5.8 (L) 02/25/2023   ALBUMINELP 3.4 02/25/2023   A1GS 0.3 02/25/2023   A2GS 0.9 02/25/2023   BETS 0.9 02/25/2023   GAMS 0.3 (L) 02/25/2023   MSPIKE 0.1 (H) 02/25/2023   SPEI Comment 01/14/2023     Chemistry      Component Value Date/Time   NA 141 03/25/2023 0924   NA 140 08/31/2021 1026   K 4.4 03/25/2023 0924   CL 105 03/25/2023 0924   CO2 28 03/25/2023 0924   BUN 29 (H) 03/25/2023 0924   BUN 21 08/31/2021 1026   CREATININE 1.57 (H) 03/25/2023 0924      Component Value Date/Time   CALCIUM 9.2 03/25/2023 0924   ALKPHOS 49 03/25/2023 0924   AST 24 03/25/2023 0924   ALT 21 03/25/2023 0924   BILITOT 0.5 03/25/2023 0924       Impression and Plan: Mr. Niel is a pleasant 77 yo caucasian gentleman with lambda light chains myeloma with mild renal  insufficiency.  For right now, we will continue to hold his treatments.  Again I do not think that the TIAs are anything related to the his myeloma.  For right now, we will continue him off therapy.  For right now, I think the benefit of treatment probably is not outweighed by any risk.  Again he has all these problems.  We will to stop his treatments for right now.  I really hope that he will not have any issues with respect to myeloma.  I just want his quality life to be as good as possible.   Josph Macho, MD 10/4/202411:21 AM

## 2023-03-27 LAB — IGG, IGA, IGM
IgA: 19 mg/dL — ABNORMAL LOW (ref 61–437)
IgG (Immunoglobin G), Serum: 211 mg/dL — ABNORMAL LOW (ref 603–1613)
IgM (Immunoglobulin M), Srm: 5 mg/dL — ABNORMAL LOW (ref 15–143)

## 2023-03-28 ENCOUNTER — Ambulatory Visit (INDEPENDENT_AMBULATORY_CARE_PROVIDER_SITE_OTHER): Payer: 59 | Admitting: Family Medicine

## 2023-03-28 ENCOUNTER — Encounter: Payer: Self-pay | Admitting: Family Medicine

## 2023-03-28 VITALS — BP 126/62 | HR 74 | Temp 98.6°F | Wt 164.4 lb

## 2023-03-28 DIAGNOSIS — E782 Mixed hyperlipidemia: Secondary | ICD-10-CM | POA: Diagnosis not present

## 2023-03-28 DIAGNOSIS — Z8673 Personal history of transient ischemic attack (TIA), and cerebral infarction without residual deficits: Secondary | ICD-10-CM

## 2023-03-28 DIAGNOSIS — E1149 Type 2 diabetes mellitus with other diabetic neurological complication: Secondary | ICD-10-CM

## 2023-03-28 DIAGNOSIS — C9000 Multiple myeloma not having achieved remission: Secondary | ICD-10-CM

## 2023-03-28 DIAGNOSIS — N1831 Chronic kidney disease, stage 3a: Secondary | ICD-10-CM | POA: Diagnosis not present

## 2023-03-28 DIAGNOSIS — Z23 Encounter for immunization: Secondary | ICD-10-CM | POA: Diagnosis not present

## 2023-03-28 DIAGNOSIS — E118 Type 2 diabetes mellitus with unspecified complications: Secondary | ICD-10-CM

## 2023-03-28 DIAGNOSIS — Z951 Presence of aortocoronary bypass graft: Secondary | ICD-10-CM

## 2023-03-28 DIAGNOSIS — I4892 Unspecified atrial flutter: Secondary | ICD-10-CM | POA: Diagnosis not present

## 2023-03-28 DIAGNOSIS — I10 Essential (primary) hypertension: Secondary | ICD-10-CM

## 2023-03-28 DIAGNOSIS — I779 Disorder of arteries and arterioles, unspecified: Secondary | ICD-10-CM

## 2023-03-28 DIAGNOSIS — Z794 Long term (current) use of insulin: Secondary | ICD-10-CM | POA: Diagnosis not present

## 2023-03-28 DIAGNOSIS — I739 Peripheral vascular disease, unspecified: Secondary | ICD-10-CM

## 2023-03-28 LAB — KAPPA/LAMBDA LIGHT CHAINS
Kappa free light chain: 13 mg/L (ref 3.3–19.4)
Kappa, lambda light chain ratio: 0.96 (ref 0.26–1.65)
Lambda free light chains: 13.6 mg/L (ref 5.7–26.3)

## 2023-03-28 MED ORDER — INSULIN GLARGINE 100 UNIT/ML SOLOSTAR PEN: 30.0000 [IU] | PEN_INJECTOR | Freq: Two times a day (BID) | SUBCUTANEOUS | 1 refills | Status: DC

## 2023-03-28 MED ORDER — ROSUVASTATIN CALCIUM 20 MG PO TABS: 20.0000 mg | ORAL_TABLET | Freq: Every day | ORAL | 3 refills | Status: DC

## 2023-03-28 MED ORDER — EZETIMIBE 10 MG PO TABS: 10.0000 mg | ORAL_TABLET | Freq: Every day | ORAL | 3 refills | Status: DC

## 2023-03-28 MED ORDER — TRAZODONE HCL 50 MG PO TABS: 25.0000 mg | ORAL_TABLET | Freq: Every evening | ORAL | 1 refills | Status: DC | PRN

## 2023-03-28 MED ORDER — RYBELSUS 14 MG PO TABS: 14.0000 mg | ORAL_TABLET | Freq: Every day | ORAL | 1 refills | Status: DC

## 2023-03-28 NOTE — Progress Notes (Signed)
Patient ID: Marvin Salinas, male  DOB: 1945/07/14, 77 y.o.   MRN: [id number] Patient Care Team    Relationship Specialty Notifications Start End  Natalia Leatherwood, DO PCP - General Family Medicine  01/24/19   Coralyn Helling, MD (Inactive) Consulting Physician Pulmonary Disease  01/26/19   [name], Deloris Ping, MD Consulting Physician Cardiology  04/30/20   Rachael Fee, MD Attending Physician Gastroenterology  10/23/20   Erenest Blank, NP Nurse Practitioner Nurse Practitioner  10/23/20    Comment: Hematology  Van Clines, MD Consulting Physician Neurology  09/01/21   Randa Lynn, MD Consulting Physician Nephrology  10/28/21    Comment: [facility] [place] [facility]  [facility]    03/28/23   Iran Ouch, MD Consulting Physician Cardiology  03/28/23     Chief Complaint  Patient presents with   Diabetes    [facility]; [facility] not sure of name     Subjective: Marvin Salinas is a 77 y.o.  male present for Chronic Conditions/illness Management Type 2 diabetes mellitus with stage 3 chronic kidney disease, with long-term current use of insulin (HCC) Pt reports compliance with Lantus 35 twice daily(usually) units , Rybelsus 14 mg daily  Patient denies dizziness, hyperglycemic or hypoglycemic events. Patient denies numbness, tingling in the extremities or nonhealing wounds of feet.   Atrial fibrillation with RVR (HCC)/Essential hypertension/Ischemic heart disease/ S/P CABG x 3/Paroxysmal atrial flutter (HCC)/PAD (peripheral artery disease) (HCC)/Nonsustained ventricular tachycardia (HCC)/HLD/NSTEMI/h/o stroke/CKD 3/chronic anticoagulation Patient has a history of severe 3-vessel coronary artery disease with chronic total occlusions of the proximal/mid LAD, mid/distal left circumflex/and proximal RCA.  Moderate caliber D1 and distal RPDA demonstrate 70-80% stenosis.  Moderately elevated left ventricular filling pressure.  Coronary artery stents and CABG have been completed in the  past.  He also has a history of cerebral ischemia and TIAs.  Pt reports compliance with  Crestor, Eliquis,  metoprolol 12.5 mg twice daily, Zetia, tikosyn.  Patient denies chest pain, shortness of breath, dizziness or lower extremity edema.  Pt is prescribed statin. Diet: Heart healthy-low-sodium diet encouraged RF: Hypertension, hyperlipidemia, former smoker, STEMI, stroke, diabetes, family history, CAD, CVD, PAD, bilateral carotid artery stenosis, TIA   Lambda light chain myeloma: Established with oncology, Dr. Myna Hidalgo.  Prescribed Zofran, gabapentin and famciclovir    03/28/2023    8:36 AM 03/21/2023    1:07 PM 10/11/2022    8:20 AM 03/31/2022   11:12 AM 03/19/2022    2:00 PM  Depression screen PHQ 2/9  Decreased Interest 1 0 1 0 0  Down, Depressed, Hopeless 1 0 1 0 0  PHQ - 2 Score 2 0 2 0 0  Altered sleeping 1  1    Tired, decreased energy 1  1    Change in appetite 0  0    Feeling bad or failure about yourself  0  0    Trouble concentrating 0  0    Moving slowly or fidgety/restless 0  0    Suicidal thoughts 0  0    PHQ-9 Score 4  4    Difficult doing work/chores Not difficult at all  Somewhat difficult        03/28/2023    8:36 AM 10/28/2021    9:13 AM  GAD 7 : Generalized Anxiety Score  Nervous, Anxious, on Edge 0 0  Control/stop worrying 1 1  Worry too much - different things 1 1  Trouble relaxing 1 1  Restless 1 1  Easily annoyed or irritable 0 0  Afraid - awful might happen 0 0  Total GAD 7 Score 4 4  Anxiety Difficulty Not difficult at all           03/28/2023    8:35 AM 03/21/2023    9:22 AM 10/11/2022    8:20 AM 06/26/2022    9:16 AM 03/19/2022    1:59 PM  Fall Risk   Falls in the past year? 0 0 1 1 1   Number falls in past yr: 0 0 1 0 0  Injury with Fall? 0 0 0 0 0  Risk for fall due to :  No Fall Risks   History of fall(s)  Follow up Falls evaluation completed Falls evaluation completed Falls evaluation completed  Falls evaluation completed     Immunization History  Administered Date(s) Administered   Fluad Quad(high Dose 65+) 02/12/2019, 04/29/2020, 04/22/2021, 06/09/2022   Fluzone Influenza virus vaccine,trivalent (IIV3), split virus 04/21/2016   Influenza Split 05/08/2013, 03/28/2017   Influenza, High Dose Seasonal PF 05/08/2013, 03/28/2017, 05/02/2018   Influenza,inj,Quad PF,6+ Mos 04/21/2016   MODERNA COVID-19 SARS-COV-2 PEDS BIVALENT BOOSTER 56yr-85yr 06/18/2020, 12/10/2020   Moderna Sars-Covid-2 Vaccination 08/27/2019, 10/03/2019   Pneumococcal Conjugate-13 07/13/2016   Pneumococcal Polysaccharide-23 02/21/2018   Tdap 02/15/2013   Zoster, Live 06/22/2015    No results found.  Past Medical History:  Diagnosis Date   Acute kidney injury superimposed on chronic kidney disease (HCC) 10/12/2017   Atrial fibrillation with RVR (HCC) 10/27/2017   Back pain    Basilar artery stenosis    on chronic Plavix   Benign prostatic hypertrophy without urinary obstruction 04/17/2014   Carotid artery disease (HCC) 10/06/2010   Carotid US 04/2019: Bilat ICA 40-59; L subclavian stenosis  // Carotid US 11/21: Bilat ICA 40-59; bilateral subclavian stenosis // Carotid US 11/22: Bilateral ICA 40-59; left subclavian stenosis // Carotid US 04/28/2022: Bilateral ICA 40-59; left subclavian stenosis   Cerebral vascular disease    with prior TIA's; followed by Dr. Sandria Manly   Depression, neurotic 11/21/2013   Diabetes mellitus    on insulin   Enthesopathy of ankle and tarsus 09/04/2007   Overview:  Metatarsalgia    Enthesopathy of ankle and tarsus 09/04/2007   Overview:  Metatarsalgia  Formatting of this note might be different from the original. Metatarsalgia  10/1 IMO update   History of renal calculi    Hyperlipidemia    Hypertension    Ischemic heart disease    prior PCI to RCA in 1989. S/P PCI to LAD and OM in 1992. S/P PCI to first DX in 2000. S/P CABG x 3 in May 2011   Lambda light chain myeloma (HCC) 10/28/2021   Left hip pain  01/03/2020   OSA (obstructive sleep apnea) 01/11/2018   AHI 18.1 and SaO2 low 73%   PAD (peripheral artery disease) (HCC) 11/18/2017   ABIs/Arterial US 04/2019: R 1.21; L 0.66 // R SFA 30-49, stable > 50 CIA and EIA stenosis; L > 50 CIA stenosis (likely represents severe stenosis or short segment occlusion)   Peripheral neuropathy    Pneumonia 02/2011   Sacroiliac joint dysfunction of left side 01/03/2020   Stroke (HCC) 04/16/2001   small right cerebellar infarct on 04/16/2001 at that time he was found to have proximal left vertebral artery, proximal left common carotid artery and both external carotid artery stenosis as well as intracranial stenosis involving mid basilar artery- 07/2011 add questionable TIA   Allergies  Allergen Reactions   Codeine Nausea And Vomiting   Lisinopril Cough   Nsaids Other (See Comments)    CKD   Cymbalta [Duloxetine Hcl] Other (See Comments)    Halucinations   Dapagliflozin Rash   Latex Hives   Past Surgical History:  Procedure Laterality Date    NASAL ENDOSCOPY  01/11/2020   chronic rhinitis w/o evidence of acute sinusitis, bilateral inferior turbinate hypertrophy   ABDOMINAL AORTOGRAM W/LOWER EXTREMITY Bilateral 05/30/2019   Procedure: ABDOMINAL AORTOGRAM W/LOWER EXTREMITY;  Surgeon: Iran Ouch, MD;  Location: [facility];  Service: Cardiovascular;  Laterality: Bilateral;   ANGIOPLASTY  1989   right coronary artery   ANGIOPLASTY  1992   LAD and OM   ANGIOPLASTY  1998   First DX   BRAIN SURGERY     on prior records   CORONARY ARTERY BYPASS GRAFT  11/12/2009   LIMA to LAD, SVG to OM and SVG to RCA   CORONARY STENT PLACEMENT  2000   Stent to LAD/Circumflex with angioplasty to first diagonal    LEFT HEART CATH AND CORS/GRAFTS ANGIOGRAPHY N/A 10/13/2017   Procedure: LEFT HEART CATH AND CORS/GRAFTS ANGIOGRAPHY;  Surgeon: Yvonne Kendall, MD;  Location: [facility];  Service: Cardiovascular;  Laterality: N/A;   Family History   Problem Relation Age of Onset   Depression Mother    Early death Mother    Kidney disease Mother    Ovarian cancer Mother    Hypertension Father    Heart disease Father    Heart attack Father    Asthma Brother    Diabetes Brother    Stroke Other        Uncle   Diabetes Sister    Colon cancer Neg Hx    Rectal cancer Neg Hx    Stomach cancer Neg Hx    Esophageal cancer Neg Hx    Social History   Social History Narrative   Marital status/children/pets: divorced.   Education/employment: retired, Patient has his GED. 9th grade education.    Safety:      -smoke alarm in the home:Yes     - wears seatbelt: Yes   Patient is right handed.   Patient does not drink any caffeine.   Lives in a one story home     Allergies as of 03/28/2023       Reactions   Codeine Nausea And Vomiting   Lisinopril Cough   Nsaids Other (See Comments)   CKD   Cymbalta [duloxetine Hcl] Other (See Comments)   Halucinations   Dapagliflozin Rash   Latex Hives        Medication List        Accurate as of March 28, 2023  8:58 AM. If you have any questions, ask your nurse or doctor.          acetaminophen 500 MG tablet Commonly known as: TYLENOL Take 1,000 mg by mouth every 6 (six) hours as needed.   apixaban 5 MG Tabs tablet Commonly known as: Eliquis Take 1 tablet (5 mg total) by mouth 2 (two) times daily.   Aspirin Low Dose 81 MG chewable tablet Generic drug: aspirin Chew 1 tablet (81 mg total) by mouth daily.   BD Pen Needle Nano 2nd Gen 32G X 4 MM Misc Generic drug: Insulin Pen Needle Place 1 Needle onto the skin in the morning and at bedtime.   dofetilide 125 MCG capsule Commonly known as: TIKOSYN Take 1 capsule (125 mcg total) by  mouth 2 (two) times daily.   ezetimibe 10 MG tablet Commonly known as: ZETIA Take 1 tablet (10 mg total) by mouth daily.   famciclovir 250 MG tablet Commonly known as: FAMVIR Take 1 tablet (250 mg total) by mouth daily.   gabapentin 300 MG  capsule Commonly known as: NEURONTIN Take 1 capsule (300 mg total) by mouth 3 (three) times daily.   GENTEAL OP Place 1 drop into both eyes daily as needed (dry eyes).   insulin glargine 100 UNIT/ML Solostar Pen Commonly known as: LANTUS Inject 30-40 Units into the skin 2 (two) times daily. What changed: how much to take Changed by: Felix Pacini   loratadine 10 MG tablet Commonly known as: CLARITIN Take 10 mg by mouth daily as needed for allergies.   metoprolol tartrate 25 MG tablet Commonly known as: LOPRESSOR Take 0.5 tablets (12.5 mg total) by mouth 2 (two) times daily.   nitroGLYCERIN 0.4 MG SL tablet Commonly known as: NITROSTAT Place 0.4 mg under the tongue every 5 (five) minutes as needed for chest pain.   olmesartan 5 MG tablet Commonly known as: BENICAR Take 5 mg by mouth daily.   ondansetron 8 MG tablet Commonly known as: ZOFRAN Take 1 tablet (8 mg total) by mouth every 8 (eight) hours as needed for nausea or vomiting.   OneTouch Delica Plus Lancet33G Misc USE UP TO FOUR TIMES DAILY AS DIRECTED   OneTouch Verio test strip Generic drug: glucose blood USE UP TO FOUR TIMES DAILY AS DIRECTED.   pantoprazole 40 MG tablet Commonly known as: PROTONIX Take 1 tablet (40 mg total) by mouth daily.   polyethylene glycol powder 17 GM/SCOOP powder Commonly known as: GLYCOLAX/MIRALAX Take by mouth every other day.   rosuvastatin 20 MG tablet Commonly known as: CRESTOR Take 1 tablet (20 mg total) by mouth at bedtime.   Rybelsus 14 MG Tabs Generic drug: Semaglutide Take 1 tablet (14 mg total) by mouth daily.   traZODone 50 MG tablet Commonly known as: DESYREL Take 0.5-1 tablets (25-50 mg total) by mouth at bedtime as needed for sleep. Started by: Felix Pacini        All past medical history, surgical history, allergies, family history, immunizations andmedications were updated in the EMR today and reviewed under the history and medication portions of their  EMR.      ROS: 14 pt review of systems performed and negative (unless mentioned in an HPI)  Objective: BP 126/62   Pulse 74   Temp 98.6 F (37 C)   Wt 164 lb 6.4 oz (74.6 kg)   SpO2 99%   BMI 25.00 kg/m  Physical Exam Vitals and nursing note reviewed.  Constitutional:      General: He is not in acute distress.    Appearance: Normal appearance. He is not ill-appearing, toxic-appearing or diaphoretic.  HENT:     Head: Normocephalic and atraumatic.  Eyes:     General: No scleral icterus.       Right eye: No discharge.        Left eye: No discharge.     Extraocular Movements: Extraocular movements intact.     Pupils: Pupils are equal, round, and reactive to light.  Cardiovascular:     Rate and Rhythm: Normal rate and regular rhythm.     Heart sounds: No murmur heard. Pulmonary:     Effort: Pulmonary effort is normal. No respiratory distress.     Breath sounds: Normal breath sounds. No wheezing, rhonchi or rales.  Musculoskeletal:  Cervical back: Neck supple.     Right lower leg: No edema.     Left lower leg: No edema.  Skin:    General: Skin is warm and dry.     Coloration: Skin is not jaundiced or pale.     Findings: No rash.  Neurological:     Mental Status: He is alert and oriented to person, place, and time. Mental status is at baseline.  Psychiatric:        Mood and Affect: Mood normal.        Behavior: Behavior normal.        Thought Content: Thought content normal.        Judgment: Judgment normal.    Diabetic Foot Exam - Simple   No data filed      No results found for this or any previous visit (from the past 24 hour(s)).    Assessment/plan: RAIHAN DERAGON is a 77 y.o. male present for chronic condition management Type 2 diabetes mellitus with stage 3 chronic kidney disease, with long-term current use of insulin (HCC) -Patient was encouraged to follow a diabetic diet and make an effort to get routine exercise.  -continue Lantus to 35-40 units  BID- depending on chemo days - Given his cardiac history and his CKD oral options are limited-insurance does not cover Jardiance.  Developed a rash with [place]. -continue Rybelsus 14 mg daily - Medicines tried: Ozempic (side effects), Farxiga (rash) - He declined nutrition referral - microalb collected today PNA series: Pneumonia series completed Flu shot:declined(recommneded yearly).   Foot exam: Referred to podiatry 12/2018-completed 10/11/2022 Eye exam: [facility] A1c: 8.9>>> 13.7 (08/22/2018)>>11 (01/24/2019)>>10.8 08/20/2019> 10.5>10.3> 6.4 > 6.2 > 8.4 >7.6 today> 8.1(requiring chronic steroids for myeloma) > 8.8>7.0: (03/17/2023)  Atrial fibrillation with RVR (HCC)/Essential hypertension/Ischemic heart disease/ S/P CABG x 3/Paroxysmal atrial flutter (HCC)/PAD (peripheral artery disease) (HCC)/Nonsustained ventricular tachycardia (HCC)/HLD/NSTEMI/h/o stroke/PAD Stable - Patient aware goal is less than 130/80.   - Prescribed by cardio: Metoprolol, tikosyn, Eliquis (recently changed from xarelto) -Continue Crestor 20 mg daily  -Continue zeita. CBC, CMP and lipids up-to-date-reviewed in EMR from September and October 2024.   Chronic kidney disease (CKD), stage III (moderate) (HCC)/lambda light chain myeloma Renally dose meds. Avoid NSAIDs. PTH/calcium/vitamin D > by nephro 07/2022 Referred to nephrology 2022.   Established with heme-onc for myeloma Reviewed recent labs 02/2023  Sleep disturbance: Uncomfortable from chemo SE and can not shut his mind down to relax.  Start trazodone 25-50 mg at bedtime to help sleep PRN.    Return in about 24 weeks (around 09/12/2023) for Routine chronic condition follow-up.  No orders of the defined types were placed in this encounter.   Meds ordered this encounter  Medications   rosuvastatin (CRESTOR) 20 MG tablet    Sig: Take 1 tablet (20 mg total) by mouth at bedtime.    Dispense:  90 tablet    Refill:  3   Semaglutide (RYBELSUS) 14 MG  TABS    Sig: Take 1 tablet (14 mg total) by mouth daily.    Dispense:  90 tablet    Refill:  1   ezetimibe (ZETIA) 10 MG tablet    Sig: Take 1 tablet (10 mg total) by mouth daily.    Dispense:  90 tablet    Refill:  3   insulin glargine (LANTUS) 100 UNIT/ML Solostar Pen    Sig: Inject 30-40 Units into the skin 2 (two) times daily.    Dispense:  90  mL    Refill:  1   traZODone (DESYREL) 50 MG tablet    Sig: Take 0.5-1 tablets (25-50 mg total) by mouth at bedtime as needed for sleep.    Dispense:  90 tablet    Refill:  1    Referral Orders  No referral(s) requested today     Note is dictated utilizing voice recognition software. Although note has been proof read prior to signing, occasional typographical errors still can be missed. If any questions arise, please do not hesitate to call for verification.  Electronically signed by: Felix Pacini, DO [facility] [place]

## 2023-03-28 NOTE — Patient Instructions (Addendum)
Return in about 24 weeks (around 09/12/2023) for Routine chronic condition follow-up. New medicine is called trazodone, which can help you sleep.        Great to see you today.  I have refilled the medication(s) we provide.   If labs were collected or images ordered, we will inform you of  results once we have received them and reviewed. We will contact you either by echart message, or telephone call.  Please give ample time to the testing facility, and our office to run,  receive and review results. Please do not call inquiring of results, even if you can see them in your chart. We will contact you as soon as we are able. If it has been over 1 week since the test was completed, and you have not yet heard from [place], then please call us.    - echart message- for normal results that have been seen by the patient already.   - telephone call: abnormal results or if patient has not viewed results in their echart.  If a referral to a specialist was entered for you, please call us in 2 weeks if you have not heard from the specialist office to schedule.

## 2023-03-29 ENCOUNTER — Ambulatory Visit: Payer: 59 | Attending: Cardiovascular Disease | Admitting: Cardiovascular Disease

## 2023-03-29 ENCOUNTER — Encounter: Payer: Self-pay | Admitting: Cardiovascular Disease

## 2023-03-29 VITALS — BP 130/60 | HR 71 | Ht 68.0 in | Wt 161.8 lb

## 2023-03-29 DIAGNOSIS — Z720 Tobacco use: Secondary | ICD-10-CM | POA: Diagnosis not present

## 2023-03-29 DIAGNOSIS — E785 Hyperlipidemia, unspecified: Secondary | ICD-10-CM

## 2023-03-29 DIAGNOSIS — I739 Peripheral vascular disease, unspecified: Secondary | ICD-10-CM | POA: Diagnosis not present

## 2023-03-29 DIAGNOSIS — R079 Chest pain, unspecified: Secondary | ICD-10-CM | POA: Diagnosis not present

## 2023-03-29 DIAGNOSIS — I25118 Atherosclerotic heart disease of native coronary artery with other forms of angina pectoris: Secondary | ICD-10-CM | POA: Diagnosis not present

## 2023-03-29 DIAGNOSIS — I6523 Occlusion and stenosis of bilateral carotid arteries: Secondary | ICD-10-CM | POA: Diagnosis not present

## 2023-03-29 DIAGNOSIS — I4819 Other persistent atrial fibrillation: Secondary | ICD-10-CM

## 2023-03-29 LAB — PROTEIN ELECTROPHORESIS, SERUM, WITH REFLEX
A/G Ratio: 1.7 (ref 0.7–1.7)
Albumin ELP: 3.4 g/dL (ref 2.9–4.4)
Alpha-1-Globulin: 0.3 g/dL (ref 0.0–0.4)
Alpha-2-Globulin: 0.7 g/dL (ref 0.4–1.0)
Beta Globulin: 0.9 g/dL (ref 0.7–1.3)
Gamma Globulin: 0.2 g/dL — ABNORMAL LOW (ref 0.4–1.8)
Globulin, Total: 2 g/dL — ABNORMAL LOW (ref 2.2–3.9)
Total Protein ELP: 5.4 g/dL — ABNORMAL LOW (ref 6.0–8.5)

## 2023-03-29 NOTE — Patient Instructions (Signed)
Medication Instructions:  No changes *If you need a refill on your cardiac medications before your next appointment, please call your pharmacy*   Lab Work: None ordered If you have labs (blood work) drawn today and your tests are completely normal, you will receive your results only by: MyChart Message (if you have MyChart) OR A paper copy in the mail If you have any lab test that is abnormal or we need to change your treatment, we will call you to review the results.   Testing/Procedures: CARDIAC PET- Your physician has requested that you have a Cardiac Pet Stress Test.   This testing is completed at [facility] ([address] [place], [place] [address]) or [facility] ([address], [place], [place]). Please arrive 30 minutes prior to your scheduled time.  The schedulers will call you to get this scheduled. Please follow further testing instructions below.    Follow-Up: At [facility], you and your health needs are our priority.  As part of our continuing mission to provide you with exceptional heart care, we have created designated Provider Care Teams.  These Care Teams include your primary Cardiologist (physician) and Advanced Practice Providers (APPs -  Physician Assistants and Nurse Practitioners) who all work together to provide you with the care you need, when you need it.  We recommend signing up for the patient portal called "MyChart".  Sign up information is provided on this After Visit Summary.  MyChart is used to connect with patients for Virtual Visits (Telemedicine).  Patients are able to view lab/test results, encounter notes, upcoming appointments, etc.  Non-urgent messages can be sent to your provider as well.   To learn more about what you can do with MyChart, go to [email or web address].    Your next appointment:   6 month(s)  Provider:   Dr. Kirke Corin Other Instructions How to Prepare for Your Cardiac  PET/CT Stress Test:  1. Please do not take these medications before your test:   Medications that may interfere with the cardiac pharmacological stress agent (ex. nitrates - including erectile dysfunction medications, isosorbide mononitrate, tamulosin or beta-blockers) the day of the exam. (Erectile dysfunction medication should be held for at least 72 hrs prior to test) Theophylline containing medications for 12 hours. Dipyridamole 48 hours prior to the test. Your remaining medications may be taken with water. Hold the Metoprolol the morning of  2. Nothing to eat or drink, except water, 3 hours prior to arrival time.   NO caffeine/decaffeinated products, or chocolate 12 hours prior to arrival.  3. NO perfume, cologne or lotion on chest or abdomen area.          - FEMALES - Please avoid wearing dresses to this appointment.  4. Total time is 1 to 2 hours; you may want to bring reading material for the waiting time.  5. Please report to Radiology at the [facility] Main Entrance 30 minutes early for your test.  [address] [place], [place] [address]  6. Please report to Radiology at [facility] Main Entrance, medical mall, 30 mins prior to your test.  [address]  [place], [place]  [phone]  Diabetic Preparation:  Hold oral medications. You may take NPH and Lantus insulin. Do not take Humalog or Humulin R (Regular Insulin) the day of your test. Check blood sugars prior to leaving the house. If able to eat breakfast prior to 3 hour fasting, you may take all  medications, including your insulin, Do not worry if you miss your breakfast dose of insulin - start at your next meal. Patients who wear a continuous glucose monitor MUST remove the device prior to scanning.   In preparation for your appointment, medication and supplies will be purchased.  Appointment availability is limited, so if you need to cancel or reschedule, please call the  Radiology Department at [phone] [facility]) OR [phone] [facility])  24 hours in advance to avoid a cancellation fee of $100.00  What to Expect After you Arrive:  Once you arrive and check in for your appointment, you will be taken to a preparation room within the Radiology Department.  A technologist or Nurse will obtain your medical history, verify that you are correctly prepped for the exam, and explain the procedure.  Afterwards,  an IV will be started in your arm and electrodes will be placed on your skin for EKG monitoring during the stress portion of the exam. Then you will be escorted to the PET/CT scanner.  There, staff will get you positioned on the scanner and obtain a blood pressure and EKG.  During the exam, you will continue to be connected to the EKG and blood pressure machines.  A small, safe amount of a radioactive tracer will be injected in your IV to obtain a series of pictures of your heart along with an injection of a stress agent.    After your Exam:  It is recommended that you eat a meal and drink a caffeinated beverage to counter act any effects of the stress agent.  Drink plenty of fluids for the remainder of the day and urinate frequently for the first couple of hours after the exam.  Your doctor will inform you of your test results within 7-10 business days.  For more information and frequently asked questions, please visit our website : [email or web address]  For questions about your test or how to prepare for your test, please call: Cardiac Imaging Nurse Navigators Office: [phone]

## 2023-03-29 NOTE — Progress Notes (Signed)
Cardiology Office Note   Date:  03/29/2023   ID:  Marvin Salinas, DOB 06/15/1946, MRN [id number]  PCP:  Natalia Leatherwood, DO  Cardiologist:  Dr. Elease Hashimoto  No chief complaint on file.      History of Present Illness: Marvin Salinas is a 77 y.o. male who is here today for a follow-up visit regarding peripheral arterial disease.  He has known history of coronary artery disease status post CABG in 2001, paroxysmal atrial fibrillation on anticoagulation, essential hypertension, carotid disease, hyperlipidemia, diabetes and prior TIAs. Patient has known history of peripheral arterial disease .  He does have underlying chronic kidney disease with creatinine between 1.6-2.  The patient is followed for peripheral arterial disease with previous severe claudication.  Angiography in December 2020 showed heavily calcified subtotal occlusion of the left common iliac artery with moderate disease affecting the right common iliac artery.  I performed successful angioplasty and and balloon expandable stent placement to the left common iliac artery.   He has known history of low back pain and bilateral knee joint pain.    He has known severe lumbar radiculopathy.   He is currently being treated for multiple myeloma with chronic kidney disease.    Most recent lower extremity arterial Doppler studies in July showed noncompressible vessels with patent left iliac stent with stable moderately elevated velocities in the right iliac artery.   He quit smoking in January.    He was hospitalized recently with left eye blurred vision and vertigo.  MRI showed chronic changes.  During his hospitalization, he complained of atypical chest pain with negative troponin.  He reports severe neuropathy symptoms in both feet and hands.  In addition, he continues to complain of burning sensation in the chest that happens at rest and with exertion.  He also describes sharp discomfort in the chest lasting for few seconds.  He  had an echocardiogram done during his hospitalization which showed normal LV systolic function with mild mitral regurgitation.   Past Medical History:  Diagnosis Date   Acute kidney injury superimposed on chronic kidney disease (HCC) 10/12/2017   Atrial fibrillation with RVR (HCC) 10/27/2017   Back pain    Basilar artery stenosis    on chronic Plavix   Benign prostatic hypertrophy without urinary obstruction 04/17/2014   Carotid artery disease (HCC) 10/06/2010   Carotid US 04/2019: Bilat ICA 40-59; L subclavian stenosis  // Carotid US 11/21: Bilat ICA 40-59; bilateral subclavian stenosis // Carotid US 11/22: Bilateral ICA 40-59; left subclavian stenosis // Carotid US 04/28/2022: Bilateral ICA 40-59; left subclavian stenosis   Cerebral vascular disease    with prior TIA's; followed by Dr. Sandria Manly   Depression, neurotic 11/21/2013   Diabetes mellitus    on insulin   Enthesopathy of ankle and tarsus 09/04/2007   Overview:  Metatarsalgia    Enthesopathy of ankle and tarsus 09/04/2007   Overview:  Metatarsalgia  Formatting of this note might be different from the original. Metatarsalgia  10/1 IMO update   History of renal calculi    Hyperlipidemia    Hypertension    Ischemic heart disease    prior PCI to RCA in 1989. S/P PCI to LAD and OM in 1992. S/P PCI to first DX in 2000. S/P CABG x 3 in May 2011   Lambda light chain myeloma (HCC) 10/28/2021   Left hip pain 01/03/2020   OSA (obstructive sleep apnea) 01/11/2018   AHI 18.1 and SaO2 low 73%   PAD (  peripheral artery disease) (HCC) 11/18/2017   ABIs/Arterial US 04/2019: R 1.21; L 0.66 // R SFA 30-49, stable > 50 CIA and EIA stenosis; L > 50 CIA stenosis (likely represents severe stenosis or short segment occlusion)   Peripheral neuropathy    Pneumonia 02/2011   Sacroiliac joint dysfunction of left side 01/03/2020   Stroke (HCC) 04/16/2001   small right cerebellar infarct on 04/16/2001 at that time he was found to have proximal left  vertebral artery, proximal left common carotid artery and both external carotid artery stenosis as well as intracranial stenosis involving mid basilar artery- 07/2011 add questionable TIA    Past Surgical History:  Procedure Laterality Date    NASAL ENDOSCOPY  01/11/2020   chronic rhinitis w/o evidence of acute sinusitis, bilateral inferior turbinate hypertrophy   ABDOMINAL AORTOGRAM W/LOWER EXTREMITY Bilateral 05/30/2019   Procedure: ABDOMINAL AORTOGRAM W/LOWER EXTREMITY;  Surgeon: Iran Ouch, MD;  Location: [facility];  Service: Cardiovascular;  Laterality: Bilateral;   ANGIOPLASTY  1989   right coronary artery   ANGIOPLASTY  1992   LAD and OM   ANGIOPLASTY  1998   First DX   BRAIN SURGERY     on prior records   CORONARY ARTERY BYPASS GRAFT  11/12/2009   LIMA to LAD, SVG to OM and SVG to RCA   CORONARY STENT PLACEMENT  2000   Stent to LAD/Circumflex with angioplasty to first diagonal    LEFT HEART CATH AND CORS/GRAFTS ANGIOGRAPHY N/A 10/13/2017   Procedure: LEFT HEART CATH AND CORS/GRAFTS ANGIOGRAPHY;  Surgeon: Yvonne Kendall, MD;  Location: [facility];  Service: Cardiovascular;  Laterality: N/A;     Current Outpatient Medications  Medication Sig Dispense Refill   acetaminophen (TYLENOL) 500 MG tablet Take 1,000 mg by mouth every 6 (six) hours as needed.     apixaban (ELIQUIS) 5 MG TABS tablet Take 1 tablet (5 mg total) by mouth 2 (two) times daily. 180 tablet 1   aspirin 81 MG chewable tablet Chew 1 tablet (81 mg total) by mouth daily. 90 tablet 0   BD PEN NEEDLE NANO 2ND GEN 32G X 4 MM MISC Place 1 Needle onto the skin in the morning and at bedtime.     Carboxymethylcell-Hypromellose (GENTEAL OP) Place 1 drop into both eyes daily as needed (dry eyes).     dofetilide (TIKOSYN) 125 MCG capsule Take 1 capsule (125 mcg total) by mouth 2 (two) times daily. 180 capsule 2   ezetimibe (ZETIA) 10 MG tablet Take 1 tablet (10 mg total) by mouth daily. 90 tablet 3    famciclovir (FAMVIR) 250 MG tablet Take 1 tablet (250 mg total) by mouth daily. 30 tablet 12   gabapentin (NEURONTIN) 300 MG capsule Take 1 capsule (300 mg total) by mouth 3 (three) times daily. 90 capsule 3   glucose blood (ONETOUCH VERIO) test strip USE UP TO FOUR TIMES DAILY AS DIRECTED. 100 strip 3   insulin glargine (LANTUS) 100 UNIT/ML Solostar Pen Inject 30-40 Units into the skin 2 (two) times daily. 90 mL 1   Lancets (ONETOUCH DELICA PLUS LANCET33G) MISC USE UP TO FOUR TIMES DAILY AS DIRECTED 100 each 11   loratadine (CLARITIN) 10 MG tablet Take 10 mg by mouth daily as needed for allergies.     metoprolol tartrate (LOPRESSOR) 25 MG tablet Take 0.5 tablets (12.5 mg total) by mouth 2 (two) times daily. 90 tablet 3   nitroGLYCERIN (NITROSTAT) 0.4 MG SL tablet Place 0.4 mg under the  tongue every 5 (five) minutes as needed for chest pain.     olmesartan (BENICAR) 5 MG tablet Take 5 mg by mouth daily.     ondansetron (ZOFRAN) 8 MG tablet Take 1 tablet (8 mg total) by mouth every 8 (eight) hours as needed for nausea or vomiting. 30 tablet 3   pantoprazole (PROTONIX) 40 MG tablet Take 1 tablet (40 mg total) by mouth daily. 30 tablet 0   polyethylene glycol powder (GLYCOLAX/MIRALAX) 17 GM/SCOOP powder Take by mouth every other day.     rosuvastatin (CRESTOR) 20 MG tablet Take 1 tablet (20 mg total) by mouth at bedtime. 90 tablet 3   Semaglutide (RYBELSUS) 14 MG TABS Take 1 tablet (14 mg total) by mouth daily. 90 tablet 1   traZODone (DESYREL) 50 MG tablet Take 0.5-1 tablets (25-50 mg total) by mouth at bedtime as needed for sleep. 90 tablet 1   No current facility-administered medications for this visit.    Allergies:   Codeine, Lisinopril, Nsaids, Cymbalta [duloxetine hcl], Dapagliflozin, and Latex    Social History:  The patient  reports that he quit smoking about 8 months ago. His smoking use included cigarettes. He started smoking about 54 years ago. He has a 13.5 pack-year smoking history.  He has never used smokeless tobacco. He reports that he does not drink alcohol and does not use drugs.   Family History:  The patient's family history includes Asthma in his brother; Depression in his mother; Diabetes in his brother and sister; Early death in his mother; Heart attack in his father; Heart disease in his father; Hypertension in his father; Kidney disease in his mother; Ovarian cancer in his mother; Stroke in an other family member.    ROS:  Please see the history of present illness.   Otherwise, review of systems are positive for none.   All other systems are reviewed and negative.    PHYSICAL EXAM: VS:  BP 130/60 (BP Location: Left Arm, Patient Position: Sitting, Cuff Size: Normal)   Pulse 71   Ht 5\' 8"  (1.727 m)   Wt 161 lb 12.8 oz (73.4 kg)   SpO2 94%   BMI 24.60 kg/m  , BMI Body mass index is 24.6 kg/m. GEN: Well nourished, well developed, in no acute distress  HEENT: normal  Neck: no JVD,  or masses .  Bilateral carotid bruits. Cardiac: RRR; no murmurs, rubs, or gallops,no edema  Respiratory:  clear to auscultation bilaterally, normal work of breathing GI: soft, nontender, nondistended, + BS MS: no deformity or atrophy  Skin: warm and dry, no rash Neuro:  Strength and sensation are intact Psych: euthymic mood, full affect  Vascular: Distal pulses are not palpable.  EKG:  EKG is ordered today. EKG showed: Normal sinus rhythm Left axis deviation Incomplete right bundle branch block Abnormal QRS-T angle, consider primary T wave abnormality Prolonged QT When compared with ECG of 17-Mar-2023 07:12, No significant change was found    Recent Labs: 03/18/2023: Magnesium 2.0 03/25/2023: ALT 21; BUN 29; Creatinine 1.57; Hemoglobin 9.6; Platelet Count 154; Potassium 4.4; Sodium 141    Lipid Panel    Component Value Date/Time   CHOL 123 03/17/2023 0130   CHOL 120 08/27/2022 1429   TRIG 172 (H) 03/17/2023 0130   HDL 44 03/17/2023 0130   HDL 60 08/27/2022 1429    CHOLHDL 2.8 03/17/2023 0130   VLDL 34 03/17/2023 0130   LDLCALC 45 03/17/2023 0130   LDLCALC 41 08/27/2022 1429  Wt Readings from Last 3 Encounters:  03/29/23 161 lb 12.8 oz (73.4 kg)  03/28/23 164 lb 6.4 oz (74.6 kg)  03/25/23 166 lb (75.3 kg)           11/30/2016    7:59 AM  PAD Screen  Previous PAD dx? Yes  Previous surgical procedure? No  Pain with walking? Yes  Subsides with rest? No  Feet/toe relief with dangling? Yes  Painful, non-healing ulcers? No  Extremities discolored? No      ASSESSMENT AND PLAN:  1.  Peripheral arterial disease: Status post  stent placement to the left common iliac artery for subtotal occlusion.   Most recent Doppler showed patent stent.  Continue medical therapy and repeat studies in 1 year.  2. Coronary artery disease with other forms of angina: He has 2 different types of chest pain.the sharp pain seems to be musculoskeletal but he describes burning sensation that worsens with exertion.  I requested cardiac PET scan for evaluation.  He is not able to exercise on a treadmill due to severe neuropathy.    3. Tobacco use: He quit smoking in January.  4. Carotid disease: Most recent carotid Doppler showed stable moderate bilateral disease.    5. Hyperlipidemia: Most recent Doppler showed an LDL of 41.  Continue rosuvastatin and ezetimibe.  6.  Persistent atrial fibrillation: Maintaining in sinus rhythm with dofetilide.  On long-term anticoagulation with Eliquis.   Disposition:   FU with me in 6 month.   Signed,  Lorine Bears, MD  03/29/2023 8:53 AM    [facility]

## 2023-03-30 ENCOUNTER — Encounter: Payer: Self-pay | Admitting: Family

## 2023-04-12 NOTE — Progress Notes (Signed)
Patient ID: Marvin Salinas, male   DOB: [birth year], 77 y.o.   MRN: [id number]  Reason for Consult: New Patient (Initial Visit)   Referred by Felix Pacini A, DO  Subjective:     HPI  Marvin Salinas is a 77 y.o. male with A-fib on Eliquis, CAD status post CABG in 2001, diabetes on insulin, HLD, HTN, PAD, basilar artery stenosis and carotid artery stenosis presenting for evaluation after an episode of left eye blurriness and dizziness in September.  During this episode he presented to the emergency department and imaging was negative for any acute stroke.  CTA did note bilateral carotid stenosis for which she was sent to [facility] for referral. He denies any one-sided weakness, numbness or speech issues.  Specifically regarding his left eye symptoms he reports blurry vision associated with dizziness, he denies temporary blindness.  He is a former smoker and quit in January 2024.  He is compliant with aspirin and Crestor. He has been being followed by cardiology for his PAD and carotid disease which have been stable and under surveillance. He does report claudication but is being followed by Dr. Kirke Corin.    Past Medical History:  Diagnosis Date   Acute kidney injury superimposed on chronic kidney disease (HCC) 10/12/2017   Atrial fibrillation with RVR (HCC) 10/27/2017   Back pain    Basilar artery stenosis    on chronic Plavix   Benign prostatic hypertrophy without urinary obstruction 04/17/2014   Carotid artery disease (HCC) 10/06/2010   Carotid US 04/2019: Bilat ICA 40-59; L subclavian stenosis  // Carotid US 11/21: Bilat ICA 40-59; bilateral subclavian stenosis // Carotid US 11/22: Bilateral ICA 40-59; left subclavian stenosis // Carotid US 04/28/2022: Bilateral ICA 40-59; left subclavian stenosis   Cerebral vascular disease    with prior TIA's; followed by Dr. Sandria Manly   Depression, neurotic 11/21/2013   Diabetes mellitus    on insulin   Enthesopathy of ankle and tarsus 09/04/2007   Overview:   Metatarsalgia    Enthesopathy of ankle and tarsus 09/04/2007   Overview:  Metatarsalgia  Formatting of this note might be different from the original. Metatarsalgia  10/1 IMO update   History of renal calculi    Hyperlipidemia    Hypertension    Ischemic heart disease    prior PCI to RCA in 1989. S/P PCI to LAD and OM in 1992. S/P PCI to first DX in 2000. S/P CABG x 3 in May 2011   Lambda light chain myeloma (HCC) 10/28/2021   Left hip pain 01/03/2020   OSA (obstructive sleep apnea) 01/11/2018   AHI 18.1 and SaO2 low 73%   PAD (peripheral artery disease) (HCC) 11/18/2017   ABIs/Arterial US 04/2019: R 1.21; L 0.66 // R SFA 30-49, stable > 50 CIA and EIA stenosis; L > 50 CIA stenosis (likely represents severe stenosis or short segment occlusion)   Peripheral neuropathy    Pneumonia 02/2011   Sacroiliac joint dysfunction of left side 01/03/2020   Stroke (HCC) 04/16/2001   small right cerebellar infarct on 04/16/2001 at that time he was found to have proximal left vertebral artery, proximal left common carotid artery and both external carotid artery stenosis as well as intracranial stenosis involving mid basilar artery- 07/2011 add questionable TIA   Family History  Problem Relation Age of Onset   Depression Mother    Early death Mother    Kidney disease Mother    Ovarian cancer Mother    Hypertension Father  Heart disease Father    Heart attack Father    Asthma Brother    Diabetes Brother    Stroke Other        Uncle   Diabetes Sister    Colon cancer Neg Hx    Rectal cancer Neg Hx    Stomach cancer Neg Hx    Esophageal cancer Neg Hx    Past Surgical History:  Procedure Laterality Date    NASAL ENDOSCOPY  01/11/2020   chronic rhinitis w/o evidence of acute sinusitis, bilateral inferior turbinate hypertrophy   ABDOMINAL AORTOGRAM W/LOWER EXTREMITY Bilateral 05/30/2019   Procedure: ABDOMINAL AORTOGRAM W/LOWER EXTREMITY;  Surgeon: Iran Ouch, MD;  Location: [facility];  Service: Cardiovascular;  Laterality: Bilateral;   ANGIOPLASTY  1989   right coronary artery   ANGIOPLASTY  1992   LAD and OM   ANGIOPLASTY  1998   First DX   BRAIN SURGERY     on prior records   CORONARY ARTERY BYPASS GRAFT  11/12/2009   LIMA to LAD, SVG to OM and SVG to RCA   CORONARY STENT PLACEMENT  2000   Stent to LAD/Circumflex with angioplasty to first diagonal    LEFT HEART CATH AND CORS/GRAFTS ANGIOGRAPHY N/A 10/13/2017   Procedure: LEFT HEART CATH AND CORS/GRAFTS ANGIOGRAPHY;  Surgeon: Yvonne Kendall, MD;  Location: [facility];  Service: Cardiovascular;  Laterality: N/A;    Short Social History:  Social History   Tobacco Use   Smoking status: Former    Current packs/day: 0.00    Average packs/day: 0.3 packs/day for 54.0 years (13.5 ttl pk-yrs)    Types: Cigarettes    Start date: 06/29/1968    Quit date: 06/29/2022    Years since quitting: 0.7   Smokeless tobacco: Never  Substance Use Topics   Alcohol use: Never    Allergies  Allergen Reactions   Codeine Nausea And Vomiting   Lisinopril Cough   Nsaids Other (See Comments)    CKD   Cymbalta [Duloxetine Hcl] Other (See Comments)    Halucinations   Dapagliflozin Rash   Latex Hives    Current Outpatient Medications  Medication Sig Dispense Refill   acetaminophen (TYLENOL) 500 MG tablet Take 1,000 mg by mouth every 6 (six) hours as needed.     apixaban (ELIQUIS) 5 MG TABS tablet Take 1 tablet (5 mg total) by mouth 2 (two) times daily. 180 tablet 1   aspirin 81 MG chewable tablet Chew 1 tablet (81 mg total) by mouth daily. 90 tablet 0   BD PEN NEEDLE NANO 2ND GEN 32G X 4 MM MISC Place 1 Needle onto the skin in the morning and at bedtime.     Carboxymethylcell-Hypromellose (GENTEAL OP) Place 1 drop into both eyes daily as needed (dry eyes).     dofetilide (TIKOSYN) 125 MCG capsule Take 1 capsule (125 mcg total) by mouth 2 (two) times daily. 180 capsule 2   ezetimibe (ZETIA) 10 MG tablet Take 1 tablet  (10 mg total) by mouth daily. 90 tablet 3   famciclovir (FAMVIR) 250 MG tablet Take 1 tablet (250 mg total) by mouth daily. 30 tablet 12   gabapentin (NEURONTIN) 300 MG capsule Take 1 capsule (300 mg total) by mouth 3 (three) times daily. 90 capsule 3   glucose blood (ONETOUCH VERIO) test strip USE UP TO FOUR TIMES DAILY AS DIRECTED. 100 strip 3   insulin glargine (LANTUS) 100 UNIT/ML Solostar Pen Inject 30-40 Units into the skin 2 (  two) times daily. 90 mL 1   Lancets (ONETOUCH DELICA PLUS LANCET33G) MISC USE UP TO FOUR TIMES DAILY AS DIRECTED 100 each 11   loratadine (CLARITIN) 10 MG tablet Take 10 mg by mouth daily as needed for allergies.     metoprolol tartrate (LOPRESSOR) 25 MG tablet Take 0.5 tablets (12.5 mg total) by mouth 2 (two) times daily. 90 tablet 3   nitroGLYCERIN (NITROSTAT) 0.4 MG SL tablet Place 0.4 mg under the tongue every 5 (five) minutes as needed for chest pain.     olmesartan (BENICAR) 5 MG tablet Take 5 mg by mouth daily.     ondansetron (ZOFRAN) 8 MG tablet Take 1 tablet (8 mg total) by mouth every 8 (eight) hours as needed for nausea or vomiting. 30 tablet 3   pantoprazole (PROTONIX) 40 MG tablet Take 1 tablet (40 mg total) by mouth daily. 30 tablet 0   polyethylene glycol powder (GLYCOLAX/MIRALAX) 17 GM/SCOOP powder Take by mouth every other day.     rosuvastatin (CRESTOR) 20 MG tablet Take 1 tablet (20 mg total) by mouth at bedtime. 90 tablet 3   Semaglutide (RYBELSUS) 14 MG TABS Take 1 tablet (14 mg total) by mouth daily. 90 tablet 1   traZODone (DESYREL) 50 MG tablet Take 0.5-1 tablets (25-50 mg total) by mouth at bedtime as needed for sleep. 90 tablet 1   No current facility-administered medications for this visit.    REVIEW OF SYSTEMS  Negative other than noted in HPI     Objective:  Objective   Vitals:   04/15/23 0942 04/15/23 0944  BP: (!) 154/68 (!) 163/77  Pulse: (!) 57   Resp: 20   Temp: 97.9 F (36.6 C)   SpO2: 98%   Weight: 165 lb (74.8 kg)    Height: 5\' 8"  (1.727 m)    Body mass index is 25.09 kg/m.  Physical Exam General: no acute distress Cardiac: hemodynamically stable, nontachycardic Pulm: normal work of breathing  Neuro: alert, no focal deficit Extremities: no edema, cyanosis or wounds Vascular:   Right: palpable radial  Left: Palpable radial   Data: CTA head and neck was independently reviewed. About 60% ICA stenosis bilaterally by NASCET criteria Widely patent subclavian arteries bilaterally Moderate atherosclerotic disease of the origin of bilateral vertebral arteries     Assessment/Plan:     Marvin Salinas is a 77 y.o. male presenting for evaluation of carotid artery disease after an episode of dizziness and left eye blurry vision.  We discussed the typical symptoms associated with carotid artery disease I explained that I do not believe his dizziness and blurry vision issues is related to his carotid stenosis. We discussed that since he is asymptomatic is best to continue to medical therapy (aspirin, statin and abstinence from tobacco) and surveillance until 80%.  Will send referral to ophthalmology for his worsening vision  Plan follow-up in 1 year with carotid duplex   Recommendations to optimize cardiovascular risk: Abstinence from all tobacco products. Blood glucose control with goal A1c < 7%. Blood pressure control with goal blood pressure < 140/90 mmHg. Lipid reduction therapy with goal LDL-C <100 mg/dL  Aspirin 81mg  PO QD.  Atorvastatin 40-80mg  PO QD (or other "high intensity" statin therapy).     Daria Pastures MD [facility]

## 2023-04-14 ENCOUNTER — Encounter (HOSPITAL_COMMUNITY): Payer: Self-pay

## 2023-04-14 IMAGING — CT CT ABD-PELV W/ CM
2 of 5 series · 16 of 46 positions shown, 18 images · IV contrast (OMNIPAQUE 300)
Comparison: CT June 16, 2014

CLINICAL DATA: Six months of lower abdominal pain nausea weight
loss and blood in stool

EXAM:
CT ABDOMEN AND PELVIS WITH CONTRAST
TECHNIQUE: Multidetector CT imaging of the abdomen and pelvis was performed
using the standard protocol following bolus administration of
intravenous contrast.
CONTRAST:  80mL OMNIPAQUE IOHEXOL 300 MG/ML  SOLN

[Series 2: abd/pel w · axial · 0.79mm/px · z∈[-407,-7]mm · 13 of 90 slices shown, 15 images]
[im 5/90  soft-tissue]
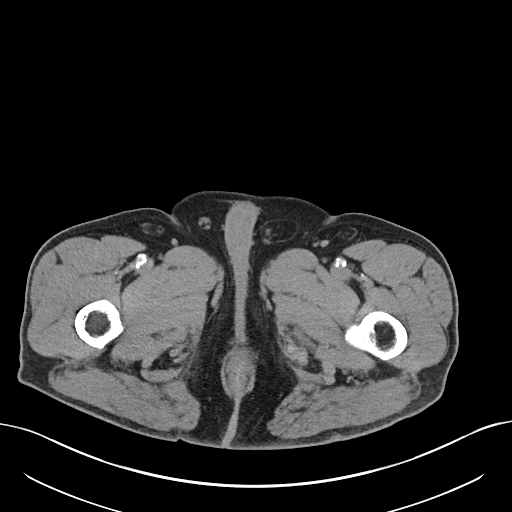
[im 5/90  bone]
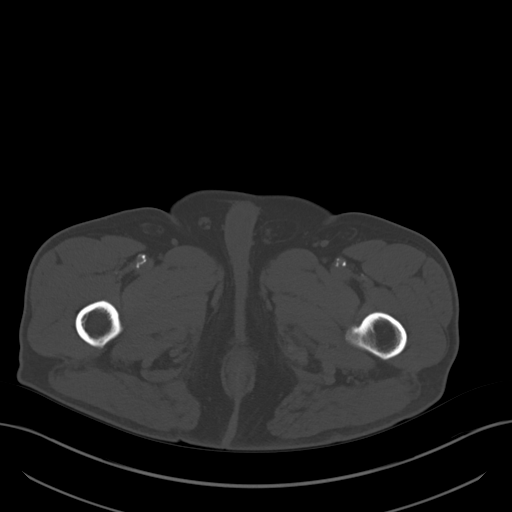
[im 15/90  soft-tissue]
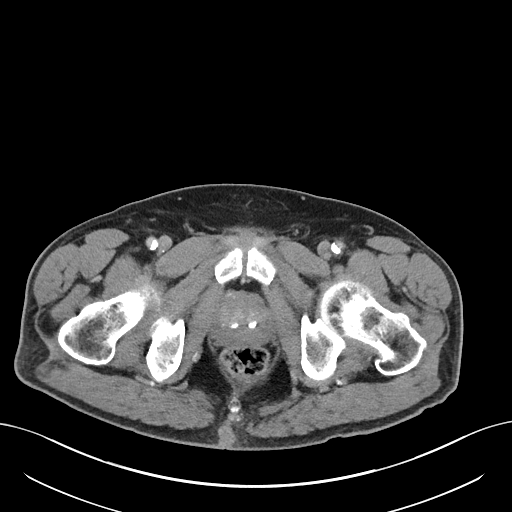
[im 19/90  soft-tissue]
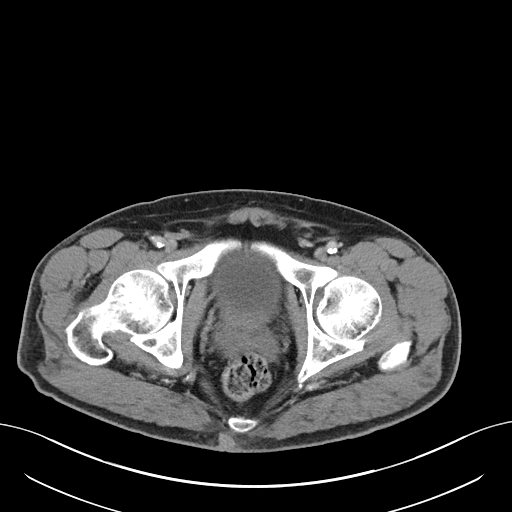
[im 24/90  soft-tissue]
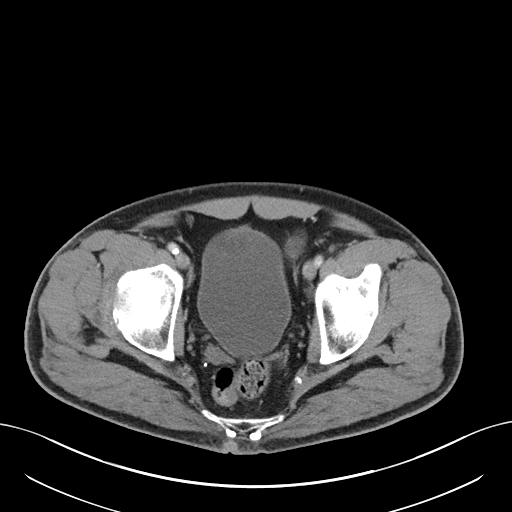
[im 33/90  soft-tissue]
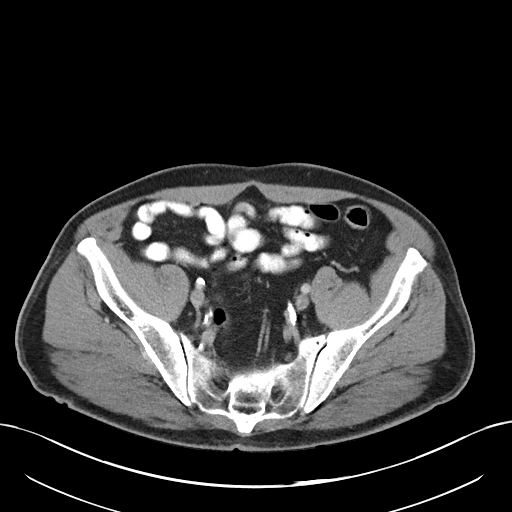
[im 38/90  soft-tissue]
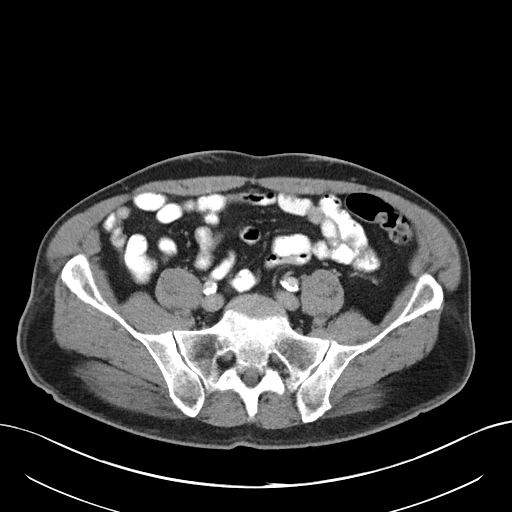
[im 47/90  soft-tissue]
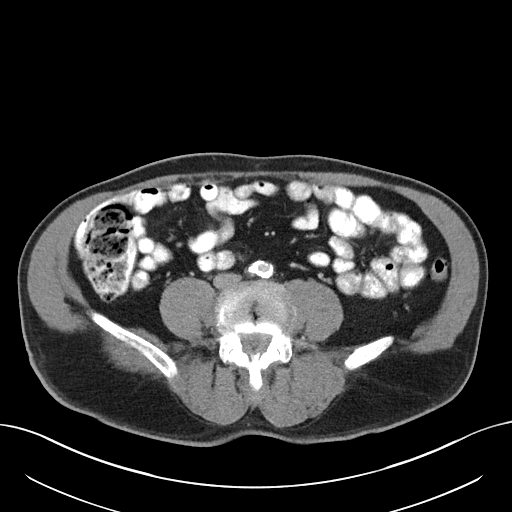
[im 52/90  soft-tissue]
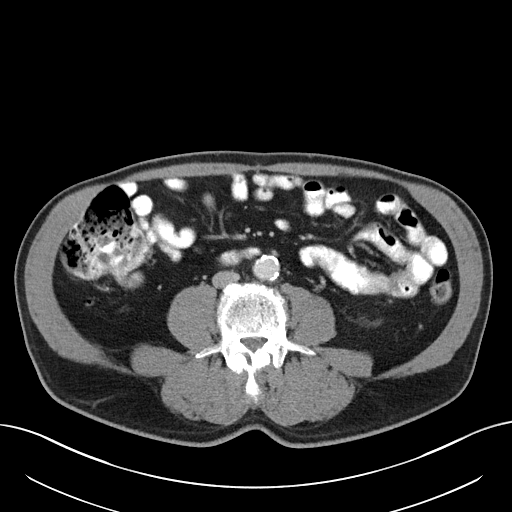
[im 57/90  soft-tissue]
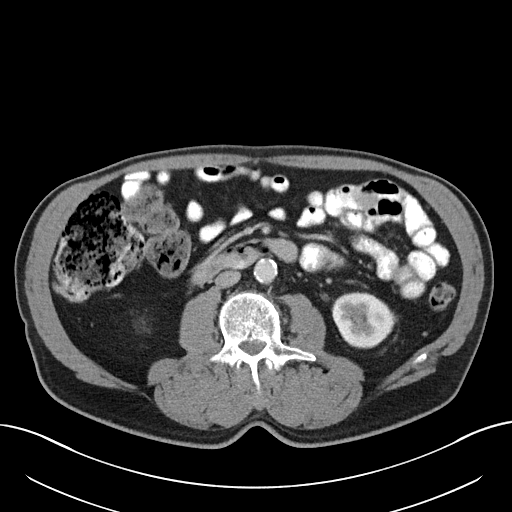
[im 57/90  bone]
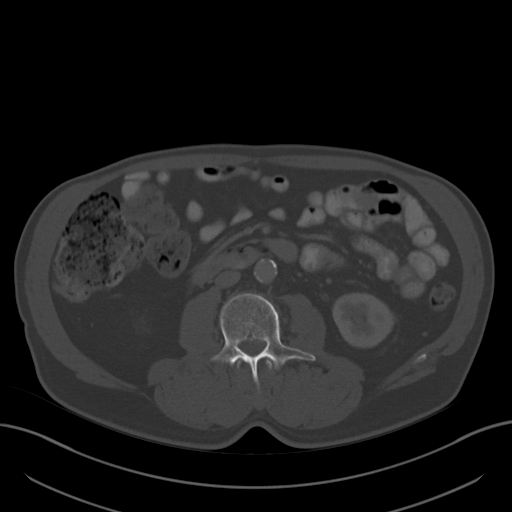
[im 66/90  soft-tissue]
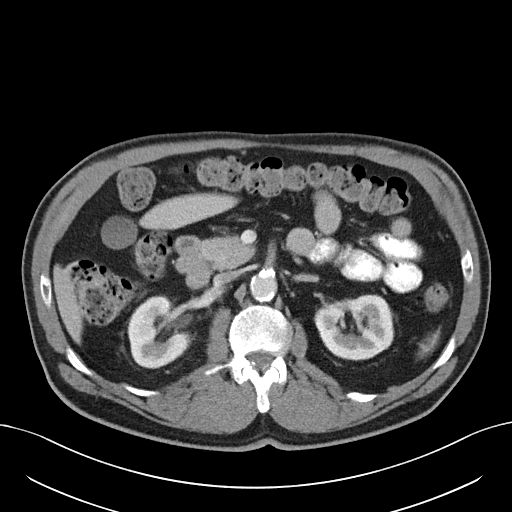
[im 71/90  soft-tissue]
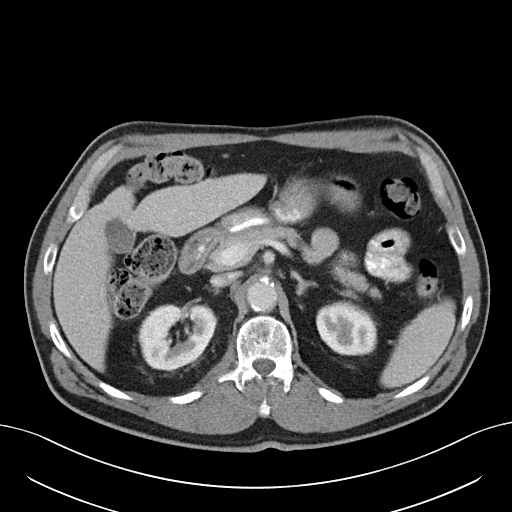
[im 75/90  soft-tissue]
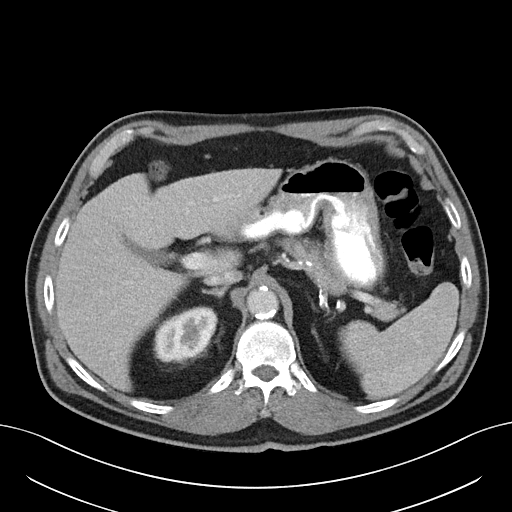
[im 85/90  soft-tissue]
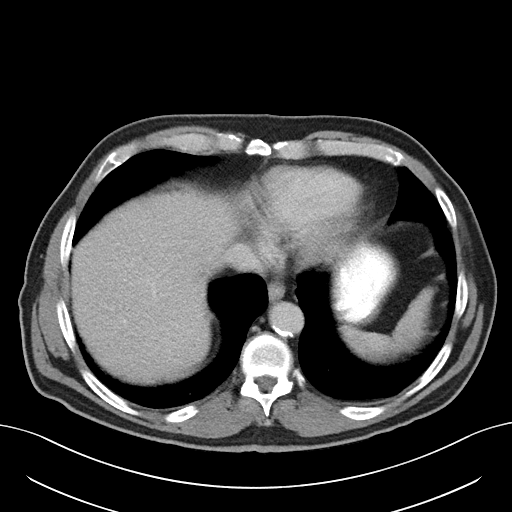

[Series 5: coronal st · coronal · 0.75mm/px · 3 of 82 slices shown]
[im 28/82  soft-tissue]
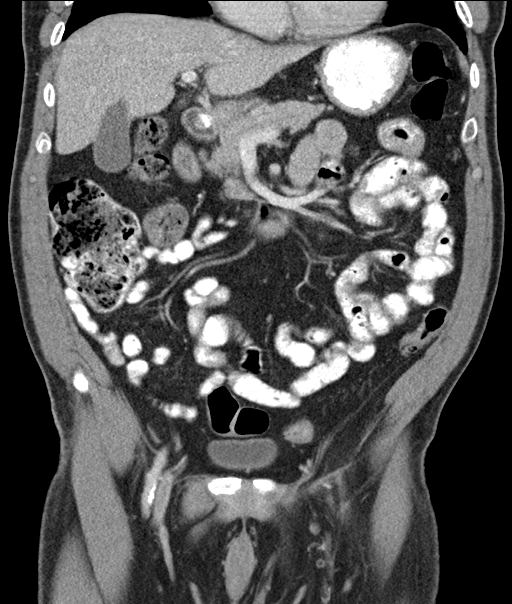
[im 37/82  soft-tissue]
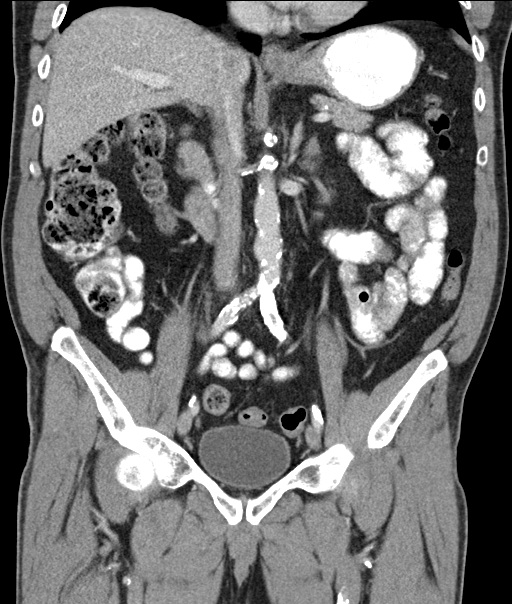
[im 46/82  soft-tissue]
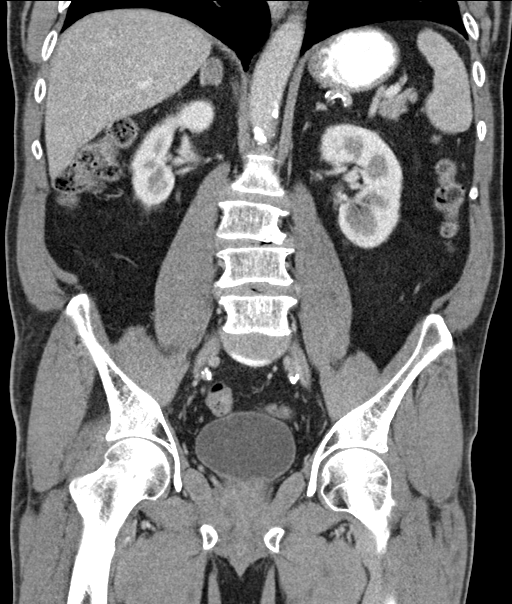

[16 of 46 positions shown; findings below may reference images not displayed]

FINDINGS: Lower chest: No acute abnormality. Median sternotomy wires. Aortic
atherosclerosis.

Hepatobiliary: No suspicious hepatic lesion. Probable mild
steatosis. Punctate cholelithiasis without evidence of acute
cholecystitis. No biliary ductal dilation.

Pancreas: Within normal limits.

Spleen: Normal in size without focal abnormality.

Adrenals/Urinary Tract: Unchanged 1.6 cm right adrenal adenoma. Left
adrenal gland is unremarkable. No hydronephrosis. No solid enhancing
renal lesions. Urinary bladder is grossly unremarkable for degree of
distension.

Stomach/Bowel: Mild symmetric wall thickening at the
gastroesophageal junction. Small duodenal diverticulum. No
pathologic small bowel dilation. Normal appendix. Moderate volume of
formed stool throughout the colon. Few scattered left-sided colonic
diverticula without findings of acute diverticulitis.

Vascular/Lymphatic: Aortic atherosclerosis without aneurysmal
dilation. No pathologically enlarged abdominal or pelvic lymph
nodes.

Reproductive: Dystrophic prostatic calcifications.

Other: No abdominopelvic ascites. Similar mild subcutaneous fat
stranding along the mid anterior abdominal wall on image 55/2.

Musculoskeletal: Multilevel degenerative changes spine. No acute
osseous abnormality.
IMPRESSION: 1. No acute findings in the abdomen or pelvis.
2. Mild symmetric wall thickening at the gastroesophageal junction,
nonspecific but may reflect esophagitis.
3. Scattered left-sided colonic diverticulosis without findings of
acute diverticulitis.
4. Moderate volume of formed stool throughout the colon.
5. Unchanged 1.6 cm right adrenal adenoma.
6. Cholelithiasis without evidence of acute cholecystitis.
7. Aortic atherosclerosis.

Aortic Atherosclerosis (03GI0-GP0.0).

## 2023-04-15 ENCOUNTER — Ambulatory Visit (INDEPENDENT_AMBULATORY_CARE_PROVIDER_SITE_OTHER): Payer: 59 | Admitting: Vascular Surgery

## 2023-04-15 ENCOUNTER — Encounter: Payer: Self-pay | Admitting: Vascular Surgery

## 2023-04-15 VITALS — BP 163/77 | HR 57 | Temp 97.9°F | Resp 20 | Ht 68.0 in | Wt 165.0 lb

## 2023-04-15 DIAGNOSIS — E785 Hyperlipidemia, unspecified: Secondary | ICD-10-CM

## 2023-04-15 DIAGNOSIS — Z794 Long term (current) use of insulin: Secondary | ICD-10-CM

## 2023-04-15 DIAGNOSIS — I6523 Occlusion and stenosis of bilateral carotid arteries: Secondary | ICD-10-CM

## 2023-04-15 DIAGNOSIS — E1169 Type 2 diabetes mellitus with other specified complication: Secondary | ICD-10-CM | POA: Diagnosis not present

## 2023-04-15 DIAGNOSIS — I10 Essential (primary) hypertension: Secondary | ICD-10-CM | POA: Diagnosis not present

## 2023-04-15 DIAGNOSIS — I2510 Atherosclerotic heart disease of native coronary artery without angina pectoris: Secondary | ICD-10-CM

## 2023-04-19 ENCOUNTER — Encounter (HOSPITAL_COMMUNITY): Admission: RE | Admit: 2023-04-19 | Payer: 59 | Source: Ambulatory Visit

## 2023-04-19 ENCOUNTER — Encounter (HOSPITAL_COMMUNITY): Payer: Self-pay

## 2023-04-25 ENCOUNTER — Encounter: Payer: Self-pay | Admitting: Family Medicine

## 2023-04-25 ENCOUNTER — Ambulatory Visit: Payer: 59 | Admitting: Family Medicine

## 2023-04-25 VITALS — BP 92/58 | HR 67 | Temp 97.4°F | Wt 161.4 lb

## 2023-04-25 DIAGNOSIS — C9000 Multiple myeloma not having achieved remission: Secondary | ICD-10-CM | POA: Diagnosis not present

## 2023-04-25 DIAGNOSIS — H538 Other visual disturbances: Secondary | ICD-10-CM

## 2023-04-25 DIAGNOSIS — I6389 Other cerebral infarction: Secondary | ICD-10-CM

## 2023-04-25 DIAGNOSIS — Z794 Long term (current) use of insulin: Secondary | ICD-10-CM | POA: Diagnosis not present

## 2023-04-25 DIAGNOSIS — E118 Type 2 diabetes mellitus with unspecified complications: Secondary | ICD-10-CM | POA: Diagnosis not present

## 2023-04-25 DIAGNOSIS — G453 Amaurosis fugax: Secondary | ICD-10-CM

## 2023-04-25 DIAGNOSIS — Z8673 Personal history of transient ischemic attack (TIA), and cerebral infarction without residual deficits: Secondary | ICD-10-CM | POA: Diagnosis not present

## 2023-04-25 NOTE — Patient Instructions (Addendum)
Return if symptoms worsen or fail to improve.  Referral Orders         Ambulatory referral to Neurology         Ambulatory referral to Ophthalmology          Great to see you today.  I have refilled the medication(s) we provide.   If labs were collected or images ordered, we will inform you of  results once we have received them and reviewed. We will contact you either by echart message, or telephone call.  Please give ample time to the testing facility, and our office to run,  receive and review results. Please do not call inquiring of results, even if you can see them in your chart. We will contact you as soon as we are able. If it has been over 1 week since the test was completed, and you have not yet heard from [place], then please call us.    - echart message- for normal results that have been seen by the patient already.   - telephone call: abnormal results or if patient has not viewed results in their echart.  If a referral to a specialist was entered for you, please call us in 2 weeks if you have not heard from the specialist office to schedule.

## 2023-04-25 NOTE — Progress Notes (Signed)
Patient ID: Marvin Salinas, male  DOB: [birth year], 77 y.o.   MRN: [id number] Patient Care Team    Relationship Specialty Notifications Start End  Natalia Leatherwood, DO PCP - General Family Medicine  01/24/19   Coralyn Helling, MD (Inactive) Consulting Physician Pulmonary Disease  01/26/19   [name], Deloris Ping, MD Consulting Physician Cardiology  04/30/20   Rachael Fee, MD Attending Physician Gastroenterology  10/23/20   Erenest Blank, NP Nurse Practitioner Nurse Practitioner  10/23/20    Comment: Hematology  Van Clines, MD Consulting Physician Neurology  09/01/21   Randa Lynn, MD Consulting Physician Nephrology  10/28/21    Comment: [facility]    03/28/23   Iran Ouch, MD Consulting Physician Cardiology  03/28/23     Chief Complaint  Patient presents with   Blurred Vision    States blurred vision in left eye has started since starting rybelsus. Stopped taking on Friday. Has had burning in chest since having chemo. Has not had treatment in 2 months. neuropathy has gotten worse recently    Subjective: JAKEEM GRAPE is a 77 y.o.  male present for worsening blurred vision  Pt reports worsening blurred vision. He now attributes to possible rybelsus use. He reports changes in vision started 2023. TIA /CVA history recently since September of this year, affecting eyes. He has been prescribed rybelsus for 2 yrs.  He also had chemo for lamda light chain myeloma.,  That currently has been put on hold. He has a history of A-fib, reports compliance with metoprolol, aspirin and Eliquis.  Managed by cardiology. He was established with neurology in March 2023, this was prior to his multiple myeloma diagnosis, treatment and his more recent CVA/TIA with visual changes. He reports he had been a patient of Dr. Hazle Quant for many years, but Dr. Randon Goldsmith office no longer took his insurance and he had to change eye doctors.  He states the last 2 eye doctors he  went to did not do a full eye exam and has been given him new eye prescriptions.  Neither of which he feels has helped with his blurred vision.     03/28/2023    8:36 AM 03/21/2023    1:07 PM 10/11/2022    8:20 AM 03/31/2022   11:12 AM 03/19/2022    2:00 PM  Depression screen PHQ 2/9  Decreased Interest 1 0 1 0 0  Down, Depressed, Hopeless 1 0 1 0 0  PHQ - 2 Score 2 0 2 0 0  Altered sleeping 1  1    Tired, decreased energy 1  1    Change in appetite 0  0    Feeling bad or failure about yourself  0  0    Trouble concentrating 0  0    Moving slowly or fidgety/restless 0  0    Suicidal thoughts 0  0    PHQ-9 Score 4  4    Difficult doing work/chores Not difficult at all  Somewhat difficult        03/28/2023    8:36 AM 10/28/2021    9:13 AM  GAD 7 : Generalized Anxiety Score  Nervous, Anxious, on Edge 0 0  Control/stop worrying 1 1  Worry too much - different things 1 1  Trouble relaxing 1 1  Restless 1 1  Easily annoyed or irritable 0 0  Afraid - awful might happen 0 0  Total GAD  7 Score 4 4  Anxiety Difficulty Not difficult at all           03/28/2023    8:35 AM 03/21/2023    9:22 AM 10/11/2022    8:20 AM 06/26/2022    9:16 AM 03/19/2022    1:59 PM  Fall Risk   Falls in the past year? 0 0 1 1 1   Number falls in past yr: 0 0 1 0 0  Injury with Fall? 0 0 0 0 0  Risk for fall due to :  No Fall Risks   History of fall(s)  Follow up Falls evaluation completed Falls evaluation completed Falls evaluation completed  Falls evaluation completed    Immunization History  Administered Date(s) Administered   Fluad Quad(high Dose 65+) 02/12/2019, 04/29/2020, 04/22/2021, 06/09/2022   Fluzone Influenza virus vaccine,trivalent (IIV3), split virus 04/21/2016   Influenza Split 05/08/2013, 03/28/2017   Influenza, High Dose Seasonal PF 05/08/2013, 03/28/2017, 05/02/2018   Influenza,inj,Quad PF,6+ Mos 04/21/2016   MODERNA COVID-19 SARS-COV-2 PEDS BIVALENT BOOSTER 10yr-10yr 06/18/2020,  12/10/2020   Moderna Sars-Covid-2 Vaccination 08/27/2019, 10/03/2019   Pneumococcal Conjugate-13 07/13/2016   Pneumococcal Polysaccharide-23 02/21/2018   Tdap 02/15/2013   Zoster, Live 06/22/2015    Vision Screening   Right eye Left eye Both eyes  Without correction     With correction 20/30 20/50 20/25     Past Medical History:  Diagnosis Date   Acute kidney injury superimposed on chronic kidney disease (HCC) 10/12/2017   Atrial fibrillation with RVR (HCC) 10/27/2017   Back pain    Basilar artery stenosis    on chronic Plavix   Benign prostatic hypertrophy without urinary obstruction 04/17/2014   Carotid artery disease (HCC) 10/06/2010   Carotid US 04/2019: Bilat ICA 40-59; L subclavian stenosis  // Carotid US 11/21: Bilat ICA 40-59; bilateral subclavian stenosis // Carotid US 11/22: Bilateral ICA 40-59; left subclavian stenosis // Carotid US 04/28/2022: Bilateral ICA 40-59; left subclavian stenosis   Cerebral vascular disease    with prior TIA's; followed by Dr. Sandria Manly   Depression, neurotic 11/21/2013   Diabetes mellitus    on insulin   Enthesopathy of ankle and tarsus 09/04/2007   Overview:  Metatarsalgia    Enthesopathy of ankle and tarsus 09/04/2007   Overview:  Metatarsalgia  Formatting of this note might be different from the original. Metatarsalgia  10/1 IMO update   History of renal calculi    Hyperlipidemia    Hypertension    Ischemic heart disease    prior PCI to RCA in 1989. S/P PCI to LAD and OM in 1992. S/P PCI to first DX in 2000. S/P CABG x 3 in May 2011   Lambda light chain myeloma (HCC) 10/28/2021   Left hip pain 01/03/2020   OSA (obstructive sleep apnea) 01/11/2018   AHI 18.1 and SaO2 low 73%   PAD (peripheral artery disease) (HCC) 11/18/2017   ABIs/Arterial US 04/2019: R 1.21; L 0.66 // R SFA 30-49, stable > 50 CIA and EIA stenosis; L > 50 CIA stenosis (likely represents severe stenosis or short segment occlusion)   Peripheral neuropathy    Pneumonia  02/2011   Sacroiliac joint dysfunction of left side 01/03/2020   Stroke (HCC) 04/16/2001   small right cerebellar infarct on 04/16/2001 at that time he was found to have proximal left vertebral artery, proximal left common carotid artery and both external carotid artery stenosis as well as intracranial stenosis involving mid basilar artery- 07/2011 add questionable TIA   Allergies  Allergen Reactions   Codeine Nausea And Vomiting   Lisinopril Cough   Nsaids Other (See Comments)    CKD   Cymbalta [Duloxetine Hcl] Other (See Comments)    Halucinations   Dapagliflozin Rash   Latex Hives   Past Surgical History:  Procedure Laterality Date    NASAL ENDOSCOPY  01/11/2020   chronic rhinitis w/o evidence of acute sinusitis, bilateral inferior turbinate hypertrophy   ABDOMINAL AORTOGRAM W/LOWER EXTREMITY Bilateral 05/30/2019   Procedure: ABDOMINAL AORTOGRAM W/LOWER EXTREMITY;  Surgeon: Iran Ouch, MD;  Location: [facility];  Service: Cardiovascular;  Laterality: Bilateral;   ANGIOPLASTY  1989   right coronary artery   ANGIOPLASTY  1992   LAD and OM   ANGIOPLASTY  1998   First DX   BRAIN SURGERY     on prior records   CORONARY ARTERY BYPASS GRAFT  11/12/2009   LIMA to LAD, SVG to OM and SVG to RCA   CORONARY STENT PLACEMENT  2000   Stent to LAD/Circumflex with angioplasty to first diagonal    LEFT HEART CATH AND CORS/GRAFTS ANGIOGRAPHY N/A 10/13/2017   Procedure: LEFT HEART CATH AND CORS/GRAFTS ANGIOGRAPHY;  Surgeon: Yvonne Kendall, MD;  Location: [facility];  Service: Cardiovascular;  Laterality: N/A;   Family History  Problem Relation Age of Onset   Depression Mother    Early death Mother    Kidney disease Mother    Ovarian cancer Mother    Hypertension Father    Heart disease Father    Heart attack Father    Asthma Brother    Diabetes Brother    Stroke Other        Uncle   Diabetes Sister    Colon cancer Neg Hx    Rectal cancer Neg Hx    Stomach  cancer Neg Hx    Esophageal cancer Neg Hx    Social History   Social History Narrative   Marital status/children/pets: divorced.   Education/employment: retired, Patient has his GED. 9th grade education.    Safety:      -smoke alarm in the home:Yes     - wears seatbelt: Yes   Patient is right handed.   Patient does not drink any caffeine.   Lives in a one story home     Allergies as of 04/25/2023       Reactions   Codeine Nausea And Vomiting   Lisinopril Cough   Nsaids Other (See Comments)   CKD   Cymbalta [duloxetine Hcl] Other (See Comments)   Halucinations   Dapagliflozin Rash   Latex Hives        Medication List        Accurate as of April 25, 2023 12:06 PM. If you have any questions, ask your nurse or doctor.          STOP taking these medications    Rybelsus 14 MG Tabs Generic drug: Semaglutide Stopped by: Felix Pacini       TAKE these medications    acetaminophen 500 MG tablet Commonly known as: TYLENOL Take 1,000 mg by mouth every 6 (six) hours as needed.   apixaban 5 MG Tabs tablet Commonly known as: Eliquis Take 1 tablet (5 mg total) by mouth 2 (two) times daily.   Aspirin Low Dose 81 MG chewable tablet Generic drug: aspirin Chew 1 tablet (81 mg total) by mouth daily.   BD Pen Needle Nano 2nd Gen 32G X 4 MM Misc Generic drug: Insulin Pen Needle  Place 1 Needle onto the skin in the morning and at bedtime.   dofetilide 125 MCG capsule Commonly known as: TIKOSYN Take 1 capsule (125 mcg total) by mouth 2 (two) times daily.   ezetimibe 10 MG tablet Commonly known as: ZETIA Take 1 tablet (10 mg total) by mouth daily.   famciclovir 250 MG tablet Commonly known as: FAMVIR Take 1 tablet (250 mg total) by mouth daily.   gabapentin 300 MG capsule Commonly known as: NEURONTIN Take 1 capsule (300 mg total) by mouth 3 (three) times daily.   GENTEAL OP Place 1 drop into both eyes daily as needed (dry eyes).   insulin glargine 100  UNIT/ML Solostar Pen Commonly known as: LANTUS Inject 30-40 Units into the skin 2 (two) times daily.   loratadine 10 MG tablet Commonly known as: CLARITIN Take 10 mg by mouth daily as needed for allergies.   metoprolol tartrate 25 MG tablet Commonly known as: LOPRESSOR Take 0.5 tablets (12.5 mg total) by mouth 2 (two) times daily.   nitroGLYCERIN 0.4 MG SL tablet Commonly known as: NITROSTAT Place 0.4 mg under the tongue every 5 (five) minutes as needed for chest pain.   olmesartan 5 MG tablet Commonly known as: BENICAR Take 5 mg by mouth daily.   ondansetron 8 MG tablet Commonly known as: ZOFRAN Take 1 tablet (8 mg total) by mouth every 8 (eight) hours as needed for nausea or vomiting.   OneTouch Delica Plus Lancet33G Misc USE UP TO FOUR TIMES DAILY AS DIRECTED   OneTouch Verio test strip Generic drug: glucose blood USE UP TO FOUR TIMES DAILY AS DIRECTED.   pantoprazole 40 MG tablet Commonly known as: PROTONIX Take 1 tablet (40 mg total) by mouth daily.   polyethylene glycol powder 17 GM/SCOOP powder Commonly known as: GLYCOLAX/MIRALAX Take by mouth every other day.   rosuvastatin 20 MG tablet Commonly known as: CRESTOR Take 1 tablet (20 mg total) by mouth at bedtime.   traZODone 50 MG tablet Commonly known as: DESYREL Take 0.5-1 tablets (25-50 mg total) by mouth at bedtime as needed for sleep.        All past medical history, surgical history, allergies, family history, immunizations andmedications were updated in the EMR today and reviewed under the history and medication portions of their EMR.      ROS: 14 pt review of systems performed and negative (unless mentioned in an HPI)  Objective: BP (!) 92/58   Pulse 67   Temp (!) 97.4 F (36.3 C)   Wt 161 lb 6.4 oz (73.2 kg)   SpO2 99%   BMI 24.54 kg/m  Physical Exam Vitals and nursing note reviewed.  Constitutional:      General: He is not in acute distress.    Appearance: Normal appearance. He is  not ill-appearing, toxic-appearing or diaphoretic.  HENT:     Head: Normocephalic and atraumatic.  Eyes:     General: No scleral icterus.       Right eye: No discharge.        Left eye: No discharge.     Extraocular Movements: Extraocular movements intact.     Pupils: Pupils are equal, round, and reactive to light.  Cardiovascular:     Rate and Rhythm: Normal rate and regular rhythm.     Heart sounds: No murmur heard. Pulmonary:     Effort: Pulmonary effort is normal. No respiratory distress.     Breath sounds: Normal breath sounds. No wheezing, rhonchi or rales.  Musculoskeletal:  Cervical back: Neck supple.     Right lower leg: No edema.     Left lower leg: No edema.  Skin:    General: Skin is warm and dry.     Coloration: Skin is not jaundiced or pale.     Findings: No rash.  Neurological:     Mental Status: He is alert and oriented to person, place, and time. Mental status is at baseline.  Psychiatric:        Mood and Affect: Mood normal.        Behavior: Behavior normal.        Thought Content: Thought content normal.        Judgment: Judgment normal.      No results found for this or any previous visit (from the past 24 hour(s)).    Assessment/plan: ZIV WELCHEL is a 77 y.o. male present for  Cerebrovascular accident (CVA) due to other mechanism (HCC)/history of stroke/amaurosis few Since last being evaluated by neurology he has had evidence of TIA and infarcts.  He also was diagnosed with multiple myeloma received chemo and increasingly blurred vision.  Referred him back to his neurologist for further eval since he is having current changes in vision - Ambulatory referral to Neurology  Blurred vision/Controlled type 2 diabetes mellitus with complication, with long-term current use of insulin (HCC) For his diabetes, okay to discontinue Rybelsus (which she had Donnazyme).  He will increase his insulin to 35-40 units daily accordingly.  Currently only taking 30  units twice daily Patient needs ophthalmology eval.  Since his ophthalmologist on taking his insurance, he has been seeing an optometrist and not getting a full eye exam. - Ambulatory referral to Ophthalmology  Patient also having labile blood pressures.  Low blood pressures could also be causing dizziness and changes in vision.  He is following with his cardiology team and he states he hold his Benicar if his blood pressures are at the lower end.   Return if symptoms worsen or fail to improve.  Orders Placed This Encounter  Procedures   Ambulatory referral to Neurology   Ambulatory referral to Ophthalmology    No orders of the defined types were placed in this encounter.   Referral Orders         Ambulatory referral to Neurology         Ambulatory referral to Ophthalmology       Note is dictated utilizing voice recognition software. Although note has been proof read prior to signing, occasional typographical errors still can be missed. If any questions arise, please do not hesitate to call for verification.  Electronically signed by: Felix Pacini, DO [facility] [place]

## 2023-05-02 ENCOUNTER — Encounter: Payer: Self-pay | Admitting: Neurology

## 2023-05-02 ENCOUNTER — Ambulatory Visit (INDEPENDENT_AMBULATORY_CARE_PROVIDER_SITE_OTHER): Payer: 59 | Admitting: Neurology

## 2023-05-02 VITALS — BP 104/44 | HR 74 | Resp 20 | Ht 68.0 in | Wt 161.0 lb

## 2023-05-02 DIAGNOSIS — Z8673 Personal history of transient ischemic attack (TIA), and cerebral infarction without residual deficits: Secondary | ICD-10-CM | POA: Diagnosis not present

## 2023-05-02 DIAGNOSIS — I671 Cerebral aneurysm, nonruptured: Secondary | ICD-10-CM | POA: Diagnosis not present

## 2023-05-02 DIAGNOSIS — I672 Cerebral atherosclerosis: Secondary | ICD-10-CM

## 2023-05-02 NOTE — Progress Notes (Signed)
NEUROLOGY FOLLOW UP OFFICE NOTE  Marvin Salinas [id number] 07-May-1946  HISTORY OF PRESENT ILLNESS: I had the pleasure of seeing Marvin Salinas in follow-up in the neurology clinic on 05/02/2023.  The patient was last seen in March 2023 for dizziness mostly when standing. Records and images were personally reviewed where available.  MRI brain in 08/2021 did not show any acute changes, there was moderate chronic microvascular disease, chronic lacunar infarct in the right basal ganglia (new from 2013), chronic left cerebellar infarct. MRA head showed intracranial atherosclerotic disease with multifocal stenoses, including severe proximal basilar artery similar to 2013, unchanged moderate stenosis within the distal right vertebral artery at vertebrobasilar junction, moderate/severe stenosis within a left PCA branch at the P2/P3 function, progressive moderate/severe stenosis within the distal cavernous/paraclinoid right ICA, 1-2 mm posterior directed aneurysm from the distal petrous right ICA, 2mm aneurysm from the distal intracranial left ICA, 3mm aneurysm from the proximal basilar artery. He was referred to Interventional Neuroradiologist Dr. Corliss Skains who he had seen in 2003 however in the interim was diagnosed with Multiple Myeloma and underwent chemotherapy. He was admitted on 03/16/23 for amaurosis fugax of the left eye. He reported 2 episodes of vision loss in the left eye, a year ago he had loss of vision in the left lateral visual field and never regained it. On 9/25, he had 30 seconds of blurry vision with some spinning shapes in the left eye and dizziness. He had a brain MRI which I personally reviewed. There were no acute changes, however there was a chronic right PCA distribution infarct that is new from 08/2021 imaging. CTA head/neck showed severe mid/proximal basilar artery stenosis, moderate stenosis at the right paraclinoid ICA, severe stenosis of bilateral vertebral arteries at their origin, 60% left  carotid bifurcation stenosis, 50% right carotid bifurcation stenosis. He was seen by Vascular Surgery with note that dizziness and blurred vision are unlikely due to carotid stenosis. He was referred to Ophthalmology and sees them in December.  When asked about eye concerns, he states that he cannot see out of the left periphery on the left eye. He still gets dizzy. BP continues to run low, last night it was 80/40 and his head was pounding. He took saltine crackers and water which helped. He drinks 3 glasses of water daily. After hospital discharge in September, he was advised to add aspirin to Eliquis. On 9/5, he was constipated then had black stools so he started cutting the aspirin in half with no further stool color changes. He has neuropathy in both feet, no focal weakness.   Lab Results  Component Value Date   HGBA1C 7.0 (H) 03/17/2023   Lab Results  Component Value Date   CHOL 123 03/17/2023   HDL 44 03/17/2023   LDLCALC 45 03/17/2023   TRIG 172 (H) 03/17/2023   CHOLHDL 2.8 03/17/2023       History on Initial Assessment 09/01/2021: This is a very pleasant 77 year old right-handed man with a history of hypertension, hyperlipidemia, DM, CAD s/p CABG, previously seen at [facility] in 2015 for moderate extracranial carotid stenosis and severe proximal basilar and distal right vertebral artery stenosis with failed attempt at angioplasty stenting in the past, presenting for evaluation of headaches and visual changes. He reported these symptoms to Dr. Lalla Brothers on his 07/17/2021 visit. He states he would feel like he is going to faint, he feels lightheaded with vision becoming blurred and "all out of focus," lasting 30-60 seconds. There would be a  slight headache associated with them, which concerned him because the only time he had headaches was when he had "mini-strokes" in the early 2000s but at that time he also had numbness in his face and neck. With these episodes, there was no  associated focal numbness/tingling/weakness, speech changes, or loss of consciousness. They mostly occur when standing. He was having them quite frequently and taking prn Tylenol with resolution of headache. He feels that since he started taking the Rybelsus after eating instead of before breakfast, he has had significantly less of them, he has had only one in the past couple of weeks, last episode was Tuesday last week. He lives alone. He denies any staring/unresponsive episodes, gaps in time. He is very compliant with medications and denies missing doses. He states he had been on aspirin and Plavix for many years, then on Plavix and Eliquis. In 2022, he developed iron deficiency anemia due to GI blood loss and Plavix was stopped. He monitors his BP at home, usually at 125-130/60. He drinks 3-4 glasses of water daily. He does not sleep well, he has been so stressed due to relationship issues the past 6-7 months with 3-4 hours of sleep, some nights he does not sleep at all. He has chronic back pain radiating down the left leg and left knee pain s/p injection. His bones hurt, he is scheduled for a bone biopsy in April. He has some constipation due to iron supplements.   Laboratory Data: Lab Results  Component Value Date   CHOL 123 03/17/2023   HDL 44 03/17/2023   LDLCALC 45 03/17/2023   TRIG 172 (H) 03/17/2023   CHOLHDL 2.8 03/17/2023   Lab Results  Component Value Date   HGBA1C 7.0 (H) 03/17/2023   PAST MEDICAL HISTORY: Past Medical History:  Diagnosis Date   Acute kidney injury superimposed on chronic kidney disease (HCC) 10/12/2017   Atrial fibrillation with RVR (HCC) 10/27/2017   Back pain    Basilar artery stenosis    on chronic Plavix   Benign prostatic hypertrophy without urinary obstruction 04/17/2014   Carotid artery disease (HCC) 10/06/2010   Carotid US 04/2019: Bilat ICA 40-59; L subclavian stenosis  // Carotid US 11/21: Bilat ICA 40-59; bilateral subclavian stenosis // Carotid US  11/22: Bilateral ICA 40-59; left subclavian stenosis // Carotid US 04/28/2022: Bilateral ICA 40-59; left subclavian stenosis   Cerebral vascular disease    with prior TIA's; followed by Dr. Sandria Manly   Depression, neurotic 11/21/2013   Diabetes mellitus    on insulin   Enthesopathy of ankle and tarsus 09/04/2007   Overview:  Metatarsalgia    Enthesopathy of ankle and tarsus 09/04/2007   Overview:  Metatarsalgia  Formatting of this note might be different from the original. Metatarsalgia  10/1 IMO update   History of renal calculi    Hyperlipidemia    Hypertension    Ischemic heart disease    prior PCI to RCA in 1989. S/P PCI to LAD and OM in 1992. S/P PCI to first DX in 2000. S/P CABG x 3 in May 2011   Lambda light chain myeloma (HCC) 10/28/2021   Left hip pain 01/03/2020   OSA (obstructive sleep apnea) 01/11/2018   AHI 18.1 and SaO2 low 73%   PAD (peripheral artery disease) (HCC) 11/18/2017   ABIs/Arterial US 04/2019: R 1.21; L 0.66 // R SFA 30-49, stable > 50 CIA and EIA stenosis; L > 50 CIA stenosis (likely represents severe stenosis or short segment occlusion)   Peripheral  neuropathy    Pneumonia 02/2011   Sacroiliac joint dysfunction of left side 01/03/2020   Stroke (HCC) 04/16/2001   small right cerebellar infarct on 04/16/2001 at that time he was found to have proximal left vertebral artery, proximal left common carotid artery and both external carotid artery stenosis as well as intracranial stenosis involving mid basilar artery- 07/2011 add questionable TIA    MEDICATIONS: Current Outpatient Medications on File Prior to Visit  Medication Sig Dispense Refill   acetaminophen (TYLENOL) 500 MG tablet Take 1,000 mg by mouth every 6 (six) hours as needed.     apixaban (ELIQUIS) 5 MG TABS tablet Take 1 tablet (5 mg total) by mouth 2 (two) times daily. 180 tablet 1   aspirin 81 MG chewable tablet Chew 1 tablet (81 mg total) by mouth daily. 90 tablet 0   BD PEN NEEDLE NANO 2ND GEN 32G X 4  MM MISC Place 1 Needle onto the skin in the morning and at bedtime.     Carboxymethylcell-Hypromellose (GENTEAL OP) Place 1 drop into both eyes daily as needed (dry eyes).     dofetilide (TIKOSYN) 125 MCG capsule Take 1 capsule (125 mcg total) by mouth 2 (two) times daily. 180 capsule 2   ezetimibe (ZETIA) 10 MG tablet Take 1 tablet (10 mg total) by mouth daily. 90 tablet 3   famciclovir (FAMVIR) 250 MG tablet Take 1 tablet (250 mg total) by mouth daily. 30 tablet 12   gabapentin (NEURONTIN) 300 MG capsule Take 1 capsule (300 mg total) by mouth 3 (three) times daily. 90 capsule 3   glucose blood (ONETOUCH VERIO) test strip USE UP TO FOUR TIMES DAILY AS DIRECTED. 100 strip 3   insulin glargine (LANTUS) 100 UNIT/ML Solostar Pen Inject 30-40 Units into the skin 2 (two) times daily. 90 mL 1   Lancets (ONETOUCH DELICA PLUS LANCET33G) MISC USE UP TO FOUR TIMES DAILY AS DIRECTED 100 each 11   loratadine (CLARITIN) 10 MG tablet Take 10 mg by mouth daily as needed for allergies.     metoprolol tartrate (LOPRESSOR) 25 MG tablet Take 0.5 tablets (12.5 mg total) by mouth 2 (two) times daily. 90 tablet 3   nitroGLYCERIN (NITROSTAT) 0.4 MG SL tablet Place 0.4 mg under the tongue every 5 (five) minutes as needed for chest pain.     olmesartan (BENICAR) 5 MG tablet Take 5 mg by mouth daily.     ondansetron (ZOFRAN) 8 MG tablet Take 1 tablet (8 mg total) by mouth every 8 (eight) hours as needed for nausea or vomiting. 30 tablet 3   pantoprazole (PROTONIX) 40 MG tablet Take 1 tablet (40 mg total) by mouth daily. 30 tablet 0   polyethylene glycol powder (GLYCOLAX/MIRALAX) 17 GM/SCOOP powder Take by mouth every other day.     rosuvastatin (CRESTOR) 20 MG tablet Take 1 tablet (20 mg total) by mouth at bedtime. 90 tablet 3   traZODone (DESYREL) 50 MG tablet Take 0.5-1 tablets (25-50 mg total) by mouth at bedtime as needed for sleep. 90 tablet 1   [DISCONTINUED] prochlorperazine (COMPAZINE) 10 MG tablet Take 1 tablet (10  mg total) by mouth every 6 (six) hours as needed (Nausea or vomiting). 30 tablet 1   No current facility-administered medications on file prior to visit.    ALLERGIES: Allergies  Allergen Reactions   Codeine Nausea And Vomiting   Lisinopril Cough   Nsaids Other (See Comments)    CKD   Cymbalta [Duloxetine Hcl] Other (See Comments)  Halucinations   Dapagliflozin Rash   Latex Hives    FAMILY HISTORY: Family History  Problem Relation Age of Onset   Depression Mother    Early death Mother    Kidney disease Mother    Ovarian cancer Mother    Hypertension Father    Heart disease Father    Heart attack Father    Asthma Brother    Diabetes Brother    Stroke Other        Uncle   Diabetes Sister    Colon cancer Neg Hx    Rectal cancer Neg Hx    Stomach cancer Neg Hx    Esophageal cancer Neg Hx     SOCIAL HISTORY: Social History   Socioeconomic History   Marital status: Divorced    Spouse name: Not on file   Number of children: 0   Years of education: GED   Highest education level: Some college, no degree  Occupational History   Occupation: retired  Tobacco Use   Smoking status: Former    Current packs/day: 0.00    Average packs/day: 0.3 packs/day for 54.0 years (13.5 ttl pk-yrs)    Types: Cigarettes    Start date: 06/29/1968    Quit date: 06/29/2022    Years since quitting: 0.8   Smokeless tobacco: Never  Vaping Use   Vaping status: Never Used  Substance and Sexual Activity   Alcohol use: Never   Drug use: No   Sexual activity: Not Currently    Partners: Female  Other Topics Concern   Not on file  Social History Narrative   Marital status/children/pets: divorced.   Education/employment: retired, Patient has his GED. 9th grade education.    Safety:      -smoke alarm in the home:Yes     - wears seatbelt: Yes   Patient is right handed.   Patient does not drink any caffeine.   Lives in a one story home    Social Determinants of Health   Financial  Resource Strain: Low Risk  (03/21/2023)   Overall Financial Resource Strain (CARDIA)    Difficulty of Paying Living Expenses: Not hard at all  Food Insecurity: No Food Insecurity (03/21/2023)   Hunger Vital Sign    Worried About Running Out of Food in the Last Year: Never true    Ran Out of Food in the Last Year: Never true  Transportation Needs: No Transportation Needs (03/21/2023)   PRAPARE - Administrator, Civil Service (Medical): No    Lack of Transportation (Non-Medical): No  Physical Activity: Unknown (03/21/2023)   Exercise Vital Sign    Days of Exercise per Week: 1 day    Minutes of Exercise per Session: Patient declined  Stress: No Stress Concern Present (03/21/2023)   [employer] of Occupational Health - Occupational Stress Questionnaire    Feeling of Stress : Only a little  Social Connections: Socially Isolated (03/21/2023)   Social Connection and Isolation Panel [NHANES]    Frequency of Communication with Friends and Family: Three times a week    Frequency of Social Gatherings with Friends and Family: Patient declined    Attends Religious Services: Never    Database administrator or Organizations: No    Attends Banker Meetings: Never    Marital Status: Divorced  Catering manager Violence: Not At Risk (03/21/2023)   Humiliation, Afraid, Rape, and Kick questionnaire    Fear of Current or Ex-Partner: No    Emotionally Abused: No  Physically Abused: No    Sexually Abused: No     PHYSICAL EXAM: Vitals:   05/02/23 1454  BP: (!) 104/44  Pulse: 74  Resp: 20  SpO2: 100%   General: No acute distress Head:  Normocephalic/atraumatic Skin/Extremities: No rash, no edema Neurological Exam: alert and awake. No aphasia or dysarthria. Fund of knowledge is appropriate.  Attention and concentration are normal.   Cranial nerves: Pupils equal, round. Extraocular movements intact with no nystagmus. Left inferior quadrantanopia. Hard of hearing.  No facial  asymmetry.  Motor: Bulk and tone normal, muscle strength 5/5 throughout with no pronator drift. Sensation intact.  Finger to nose testing intact.  Gait narrow-based, no ataxia. No tremors.    IMPRESSION: This is a very pleasant 77 yo RH man with a history of hypertension, hyperlipidemia, DM, CAD s/p CABG, previously seen at [facility] in 2015 for moderate extracranial carotid stenosis and severe proximal basilar and distal right vertebral artery stenosis with failed attempt at angioplasty stenting in the 2003. He was seen for headaches and visual changes in 08/2021, at that time, no acute changes on brain MRI, intracranial atherosclerosis again noted, as well as small aneurysms. He was in the ER for left-sided vision loss on 03/16/23, MRI brain no acute changes, however there is a chronic right PCA stroke that was new from 2023 imaging. His exam today shows a left inferior quadrantanopia from the PCA stroke. Findings discussed with patient. He is now on aspirin (but takes 1/2 tablet due to concern for GI bleed) in addition to Eliquis. Discussed CTA results again showing moderate to severe intracranial stenosis, worse in the posterior circulation, which can certainly cause dizziness and blurred vision from hypoperfusion/hypotension. He was advised to increase hydration and liberalize salt intake. We will again send referral to Interventional Neuroradiology for further evaluation if any more options are available for symptomatic intracranial stenosis. Continue control of vascular risk factors. Follow-up in 6 months, call for any changes.    Thank you for allowing me to participate in his care.  Please do not hesitate to call for any questions or concerns.    Patrcia Dolly, M.D.   CC: Dr. Claiborne Billings, Dr. Corliss Skains

## 2023-05-02 NOTE — Patient Instructions (Signed)
Good to see you.  Referral will be sent to Dr. Corliss Skains  2. Proceed with Ophthalmology appointment  3. Continue all your medications, continue working on improving sugar levels, cholesterol.  4. Increase water intake, liberalize salt intake  5. Follow-up in 6 months, call for any changes

## 2023-05-04 ENCOUNTER — Inpatient Hospital Stay: Payer: 59

## 2023-05-04 ENCOUNTER — Encounter: Payer: Self-pay | Admitting: *Deleted

## 2023-05-04 ENCOUNTER — Other Ambulatory Visit: Payer: Self-pay

## 2023-05-04 ENCOUNTER — Inpatient Hospital Stay (HOSPITAL_BASED_OUTPATIENT_CLINIC_OR_DEPARTMENT_OTHER)
Admission: EM | Admit: 2023-05-04 | Discharge: 2023-05-07 | DRG: 378 | Disposition: A | Discharge status: Home / Self Care | Payer: 59 | Attending: Family Medicine | Admitting: Family Medicine

## 2023-05-04 ENCOUNTER — Inpatient Hospital Stay: Payer: 59 | Attending: Family

## 2023-05-04 ENCOUNTER — Encounter: Payer: Self-pay | Admitting: Hematology & Oncology

## 2023-05-04 ENCOUNTER — Other Ambulatory Visit: Payer: Self-pay | Admitting: Oncology

## 2023-05-04 ENCOUNTER — Encounter (HOSPITAL_BASED_OUTPATIENT_CLINIC_OR_DEPARTMENT_OTHER): Payer: Self-pay | Admitting: Emergency Medicine

## 2023-05-04 ENCOUNTER — Inpatient Hospital Stay (HOSPITAL_BASED_OUTPATIENT_CLINIC_OR_DEPARTMENT_OTHER): Payer: 59 | Admitting: Hematology & Oncology

## 2023-05-04 VITALS — BP 161/57 | HR 80 | Temp 97.7°F | Resp 18 | Ht 68.0 in | Wt 166.0 lb

## 2023-05-04 DIAGNOSIS — Z833 Family history of diabetes mellitus: Secondary | ICD-10-CM

## 2023-05-04 DIAGNOSIS — E1149 Type 2 diabetes mellitus with other diabetic neurological complication: Secondary | ICD-10-CM | POA: Diagnosis not present

## 2023-05-04 DIAGNOSIS — B9681 Helicobacter pylori [H. pylori] as the cause of diseases classified elsewhere: Secondary | ICD-10-CM | POA: Diagnosis present

## 2023-05-04 DIAGNOSIS — I4892 Unspecified atrial flutter: Secondary | ICD-10-CM | POA: Diagnosis present

## 2023-05-04 DIAGNOSIS — C9000 Multiple myeloma not having achieved remission: Secondary | ICD-10-CM | POA: Diagnosis present

## 2023-05-04 DIAGNOSIS — Z823 Family history of stroke: Secondary | ICD-10-CM

## 2023-05-04 DIAGNOSIS — K5900 Constipation, unspecified: Secondary | ICD-10-CM | POA: Diagnosis not present

## 2023-05-04 DIAGNOSIS — K229 Disease of esophagus, unspecified: Secondary | ICD-10-CM | POA: Diagnosis not present

## 2023-05-04 DIAGNOSIS — D62 Acute posthemorrhagic anemia: Secondary | ICD-10-CM | POA: Diagnosis present

## 2023-05-04 DIAGNOSIS — I6523 Occlusion and stenosis of bilateral carotid arteries: Secondary | ICD-10-CM

## 2023-05-04 DIAGNOSIS — Z955 Presence of coronary angioplasty implant and graft: Secondary | ICD-10-CM | POA: Diagnosis not present

## 2023-05-04 DIAGNOSIS — R195 Other fecal abnormalities: Secondary | ICD-10-CM

## 2023-05-04 DIAGNOSIS — I2510 Atherosclerotic heart disease of native coronary artery without angina pectoris: Secondary | ICD-10-CM | POA: Diagnosis not present

## 2023-05-04 DIAGNOSIS — Z91040 Latex allergy status: Secondary | ICD-10-CM

## 2023-05-04 DIAGNOSIS — Z885 Allergy status to narcotic agent status: Secondary | ICD-10-CM

## 2023-05-04 DIAGNOSIS — D649 Anemia, unspecified: Secondary | ICD-10-CM | POA: Diagnosis not present

## 2023-05-04 DIAGNOSIS — K31811 Angiodysplasia of stomach and duodenum with bleeding: Principal | ICD-10-CM | POA: Diagnosis present

## 2023-05-04 DIAGNOSIS — Z794 Long term (current) use of insulin: Secondary | ICD-10-CM

## 2023-05-04 DIAGNOSIS — K2971 Gastritis, unspecified, with bleeding: Secondary | ICD-10-CM | POA: Diagnosis not present

## 2023-05-04 DIAGNOSIS — K921 Melena: Secondary | ICD-10-CM | POA: Diagnosis present

## 2023-05-04 DIAGNOSIS — E1122 Type 2 diabetes mellitus with diabetic chronic kidney disease: Secondary | ICD-10-CM | POA: Diagnosis present

## 2023-05-04 DIAGNOSIS — E119 Type 2 diabetes mellitus without complications: Secondary | ICD-10-CM | POA: Diagnosis not present

## 2023-05-04 DIAGNOSIS — I651 Occlusion and stenosis of basilar artery: Secondary | ICD-10-CM | POA: Diagnosis not present

## 2023-05-04 DIAGNOSIS — Z8249 Family history of ischemic heart disease and other diseases of the circulatory system: Secondary | ICD-10-CM

## 2023-05-04 DIAGNOSIS — Z8673 Personal history of transient ischemic attack (TIA), and cerebral infarction without residual deficits: Secondary | ICD-10-CM

## 2023-05-04 DIAGNOSIS — K259 Gastric ulcer, unspecified as acute or chronic, without hemorrhage or perforation: Secondary | ICD-10-CM | POA: Diagnosis not present

## 2023-05-04 DIAGNOSIS — M1990 Unspecified osteoarthritis, unspecified site: Secondary | ICD-10-CM | POA: Diagnosis present

## 2023-05-04 DIAGNOSIS — Z87442 Personal history of urinary calculi: Secondary | ICD-10-CM

## 2023-05-04 DIAGNOSIS — D500 Iron deficiency anemia secondary to blood loss (chronic): Secondary | ICD-10-CM | POA: Diagnosis not present

## 2023-05-04 DIAGNOSIS — N400 Enlarged prostate without lower urinary tract symptoms: Secondary | ICD-10-CM | POA: Diagnosis present

## 2023-05-04 DIAGNOSIS — D631 Anemia in chronic kidney disease: Secondary | ICD-10-CM

## 2023-05-04 DIAGNOSIS — I10 Essential (primary) hypertension: Secondary | ICD-10-CM | POA: Diagnosis not present

## 2023-05-04 DIAGNOSIS — E1140 Type 2 diabetes mellitus with diabetic neuropathy, unspecified: Secondary | ICD-10-CM | POA: Diagnosis not present

## 2023-05-04 DIAGNOSIS — E1151 Type 2 diabetes mellitus with diabetic peripheral angiopathy without gangrene: Secondary | ICD-10-CM | POA: Diagnosis not present

## 2023-05-04 DIAGNOSIS — F341 Dysthymic disorder: Secondary | ICD-10-CM | POA: Diagnosis present

## 2023-05-04 DIAGNOSIS — Z79899 Other long term (current) drug therapy: Secondary | ICD-10-CM

## 2023-05-04 DIAGNOSIS — K5520 Angiodysplasia of colon without hemorrhage: Secondary | ICD-10-CM | POA: Diagnosis not present

## 2023-05-04 DIAGNOSIS — E785 Hyperlipidemia, unspecified: Secondary | ICD-10-CM | POA: Diagnosis not present

## 2023-05-04 DIAGNOSIS — I779 Disorder of arteries and arterioles, unspecified: Secondary | ICD-10-CM | POA: Diagnosis present

## 2023-05-04 DIAGNOSIS — I482 Chronic atrial fibrillation, unspecified: Secondary | ICD-10-CM | POA: Diagnosis present

## 2023-05-04 DIAGNOSIS — Z7901 Long term (current) use of anticoagulants: Secondary | ICD-10-CM

## 2023-05-04 DIAGNOSIS — I679 Cerebrovascular disease, unspecified: Secondary | ICD-10-CM | POA: Diagnosis present

## 2023-05-04 DIAGNOSIS — E118 Type 2 diabetes mellitus with unspecified complications: Secondary | ICD-10-CM | POA: Diagnosis not present

## 2023-05-04 DIAGNOSIS — K922 Gastrointestinal hemorrhage, unspecified: Secondary | ICD-10-CM | POA: Diagnosis not present

## 2023-05-04 DIAGNOSIS — Z87891 Personal history of nicotine dependence: Secondary | ICD-10-CM | POA: Diagnosis not present

## 2023-05-04 DIAGNOSIS — Z841 Family history of disorders of kidney and ureter: Secondary | ICD-10-CM

## 2023-05-04 DIAGNOSIS — K2970 Gastritis, unspecified, without bleeding: Secondary | ICD-10-CM | POA: Diagnosis present

## 2023-05-04 DIAGNOSIS — Z8701 Personal history of pneumonia (recurrent): Secondary | ICD-10-CM

## 2023-05-04 DIAGNOSIS — R0789 Other chest pain: Secondary | ICD-10-CM | POA: Diagnosis not present

## 2023-05-04 DIAGNOSIS — N1831 Chronic kidney disease, stage 3a: Secondary | ICD-10-CM | POA: Diagnosis not present

## 2023-05-04 DIAGNOSIS — Z951 Presence of aortocoronary bypass graft: Secondary | ICD-10-CM

## 2023-05-04 DIAGNOSIS — Z7902 Long term (current) use of antithrombotics/antiplatelets: Secondary | ICD-10-CM

## 2023-05-04 DIAGNOSIS — K5521 Angiodysplasia of colon with hemorrhage: Secondary | ICD-10-CM

## 2023-05-04 DIAGNOSIS — I129 Hypertensive chronic kidney disease with stage 1 through stage 4 chronic kidney disease, or unspecified chronic kidney disease: Secondary | ICD-10-CM | POA: Diagnosis not present

## 2023-05-04 DIAGNOSIS — G4733 Obstructive sleep apnea (adult) (pediatric): Secondary | ICD-10-CM | POA: Diagnosis present

## 2023-05-04 DIAGNOSIS — Z886 Allergy status to analgesic agent status: Secondary | ICD-10-CM

## 2023-05-04 DIAGNOSIS — N189 Chronic kidney disease, unspecified: Secondary | ICD-10-CM | POA: Diagnosis not present

## 2023-05-04 DIAGNOSIS — Z818 Family history of other mental and behavioral disorders: Secondary | ICD-10-CM

## 2023-05-04 DIAGNOSIS — N183 Chronic kidney disease, stage 3 unspecified: Secondary | ICD-10-CM | POA: Diagnosis present

## 2023-05-04 DIAGNOSIS — Z888 Allergy status to other drugs, medicaments and biological substances status: Secondary | ICD-10-CM

## 2023-05-04 DIAGNOSIS — Z7982 Long term (current) use of aspirin: Secondary | ICD-10-CM

## 2023-05-04 DIAGNOSIS — K31A11 Gastric intestinal metaplasia without dysplasia, involving the antrum: Secondary | ICD-10-CM | POA: Diagnosis not present

## 2023-05-04 LAB — CBC WITH DIFFERENTIAL (CANCER CENTER ONLY)
Abs Immature Granulocytes: 0.01 10*3/uL (ref 0.00–0.07)
Basophils Absolute: 0 10*3/uL (ref 0.0–0.1)
Basophils Relative: 1 %
Eosinophils Absolute: 0.1 10*3/uL (ref 0.0–0.5)
Eosinophils Relative: 1 %
HCT: 21.5 % — ABNORMAL LOW (ref 39.0–52.0)
Hemoglobin: 6.3 g/dL — CL (ref 13.0–17.0)
Immature Granulocytes: 0 %
Lymphocytes Relative: 20 %
Lymphs Abs: 0.8 10*3/uL (ref 0.7–4.0)
MCH: 25.4 pg — ABNORMAL LOW (ref 26.0–34.0)
MCHC: 29.3 g/dL — ABNORMAL LOW (ref 30.0–36.0)
MCV: 86.7 fL (ref 80.0–100.0)
Monocytes Absolute: 0.4 10*3/uL (ref 0.1–1.0)
Monocytes Relative: 10 %
Neutro Abs: 2.7 10*3/uL (ref 1.7–7.7)
Neutrophils Relative %: 68 %
Platelet Count: 229 10*3/uL (ref 150–400)
RBC: 2.48 MIL/uL — ABNORMAL LOW (ref 4.22–5.81)
RDW: 14.9 % (ref 11.5–15.5)
WBC Count: 3.9 10*3/uL — ABNORMAL LOW (ref 4.0–10.5)
nRBC: 0 % (ref 0.0–0.2)

## 2023-05-04 LAB — COMPREHENSIVE METABOLIC PANEL
ALT: 25 U/L (ref 0–44)
AST: 23 U/L (ref 15–41)
Albumin: 3.7 g/dL (ref 3.5–5.0)
Alkaline Phosphatase: 43 U/L (ref 38–126)
Anion gap: 7 (ref 5–15)
BUN: 17 mg/dL (ref 8–23)
CO2: 24 mmol/L (ref 22–32)
Calcium: 8.4 mg/dL — ABNORMAL LOW (ref 8.9–10.3)
Chloride: 107 mmol/L (ref 98–111)
Creatinine, Ser: 1.52 mg/dL — ABNORMAL HIGH (ref 0.61–1.24)
GFR, Estimated: 47 mL/min — ABNORMAL LOW (ref >60)
Glucose, Bld: 104 mg/dL — ABNORMAL HIGH (ref 70–99)
Potassium: 4.1 mmol/L (ref 3.5–5.1)
Sodium: 138 mmol/L (ref 135–145)
Total Bilirubin: 0.5 mg/dL (ref <1.2)
Total Protein: 5.7 g/dL — ABNORMAL LOW (ref 6.5–8.1)

## 2023-05-04 LAB — CMP (CANCER CENTER ONLY)
ALT: 24 U/L (ref 0–44)
AST: 25 U/L (ref 15–41)
Albumin: 3.8 g/dL (ref 3.5–5.0)
Alkaline Phosphatase: 45 U/L (ref 38–126)
Anion gap: 6 (ref 5–15)
BUN: 16 mg/dL (ref 8–23)
CO2: 25 mmol/L (ref 22–32)
Calcium: 8.4 mg/dL — ABNORMAL LOW (ref 8.9–10.3)
Chloride: 108 mmol/L (ref 98–111)
Creatinine: 1.53 mg/dL — ABNORMAL HIGH (ref 0.61–1.24)
GFR, Estimated: 46 mL/min — ABNORMAL LOW (ref >60)
Glucose, Bld: 97 mg/dL (ref 70–99)
Potassium: 4.1 mmol/L (ref 3.5–5.1)
Sodium: 139 mmol/L (ref 135–145)
Total Bilirubin: 0.5 mg/dL (ref <1.2)
Total Protein: 6 g/dL — ABNORMAL LOW (ref 6.5–8.1)

## 2023-05-04 LAB — GLUCOSE, CAPILLARY: Glucose-Capillary: 90 mg/dL (ref 70–99)

## 2023-05-04 LAB — SAMPLE TO BLOOD BANK

## 2023-05-04 LAB — HEMOGLOBIN AND HEMATOCRIT, BLOOD
HCT: 19.3 % — ABNORMAL LOW (ref 39.0–52.0)
Hemoglobin: 5.7 g/dL — CL (ref 13.0–17.0)

## 2023-05-04 LAB — TROPONIN I (HIGH SENSITIVITY)
Troponin I (High Sensitivity): 43 ng/L — ABNORMAL HIGH (ref <18)
Troponin I (High Sensitivity): 33 ng/L — ABNORMAL HIGH (ref <18)

## 2023-05-04 LAB — MRSA NEXT GEN BY PCR, NASAL: MRSA by PCR Next Gen: NOT DETECTED

## 2023-05-04 MED ORDER — DOFETILIDE 125 MCG PO CAPS
125.0000 ug | ORAL_CAPSULE | Freq: Two times a day (BID) | ORAL | Status: DC
  Administered 2023-05-04 – 2023-05-07 (×6): 125 ug via ORAL
  Filled 2023-05-04 (×6): qty 1

## 2023-05-04 MED ORDER — METOPROLOL TARTRATE 12.5 MG HALF TABLET
12.5000 mg | ORAL_TABLET | Freq: Two times a day (BID) | ORAL | Status: DC
  Administered 2023-05-04 – 2023-05-07 (×5): 12.5 mg via ORAL
  Filled 2023-05-04 (×6): qty 1

## 2023-05-04 MED ORDER — ONDANSETRON HCL 4 MG PO TABS: 4.0000 mg | ORAL_TABLET | Freq: Four times a day (QID) | ORAL | Status: DC | PRN

## 2023-05-04 MED ORDER — ONDANSETRON HCL 4 MG/2ML IJ SOLN
4.0000 mg | Freq: Four times a day (QID) | INTRAMUSCULAR | Status: DC | PRN
  Administered 2023-05-04: 4 mg via INTRAVENOUS
  Filled 2023-05-04: qty 2

## 2023-05-04 MED ORDER — ACETAMINOPHEN 650 MG RE SUPP: 650.0000 mg | Freq: Four times a day (QID) | RECTAL | Status: DC | PRN

## 2023-05-04 MED ORDER — INSULIN ASPART 100 UNIT/ML IJ SOLN
0.0000 [IU] | Freq: Three times a day (TID) | INTRAMUSCULAR | Status: DC
Start: 2023-05-05 — End: 2023-05-07
  Administered 2023-05-06: 2 [IU] via SUBCUTANEOUS
  Administered 2023-05-06: 3 [IU] via SUBCUTANEOUS
  Administered 2023-05-07: 2 [IU] via SUBCUTANEOUS

## 2023-05-04 MED ORDER — PANTOPRAZOLE SODIUM 40 MG IV SOLR
40.0000 mg | Freq: Two times a day (BID) | INTRAVENOUS | Status: DC
  Administered 2023-05-04 – 2023-05-06 (×5): 40 mg via INTRAVENOUS
  Filled 2023-05-04 (×6): qty 10

## 2023-05-04 MED ORDER — INFLUENZA VAC A&B SURF ANT ADJ 0.5 ML IM SUSY
0.5000 mL | PREFILLED_SYRINGE | INTRAMUSCULAR | Status: DC
  Filled 2023-05-04: qty 0.5

## 2023-05-04 MED ORDER — ONDANSETRON HCL 8 MG PO TABS: 8.0000 mg | ORAL_TABLET | Freq: Three times a day (TID) | ORAL | 3 refills | Status: DC | PRN

## 2023-05-04 MED ORDER — SODIUM CHLORIDE 0.9% IV SOLUTION: Freq: Once | INTRAVENOUS | Status: DC

## 2023-05-04 MED ORDER — NITROGLYCERIN 0.4 MG SL SUBL: 0.4000 mg | SUBLINGUAL_TABLET | SUBLINGUAL | Status: DC | PRN

## 2023-05-04 MED ORDER — VALACYCLOVIR HCL 500 MG PO TABS
1000.0000 mg | ORAL_TABLET | Freq: Every day | ORAL | Status: DC
  Administered 2023-05-05 – 2023-05-07 (×3): 1000 mg via ORAL
  Filled 2023-05-04 (×3): qty 2

## 2023-05-04 MED ORDER — ROSUVASTATIN CALCIUM 20 MG PO TABS
20.0000 mg | ORAL_TABLET | Freq: Every day | ORAL | Status: DC
  Administered 2023-05-04 – 2023-05-06 (×3): 20 mg via ORAL
  Filled 2023-05-04 (×3): qty 1

## 2023-05-04 MED ORDER — GABAPENTIN 100 MG PO CAPS
300.0000 mg | ORAL_CAPSULE | Freq: Three times a day (TID) | ORAL | Status: DC
  Administered 2023-05-04 – 2023-05-07 (×8): 300 mg via ORAL
  Filled 2023-05-04 (×4): qty 3
  Filled 2023-05-04: qty 1
  Filled 2023-05-04: qty 3
  Filled 2023-05-04: qty 1
  Filled 2023-05-04: qty 3

## 2023-05-04 MED ORDER — TRAZODONE HCL 50 MG PO TABS: 25.0000 mg | ORAL_TABLET | Freq: Every evening | ORAL | Status: DC | PRN

## 2023-05-04 MED ORDER — CHLORHEXIDINE GLUCONATE CLOTH 2 % EX PADS
6.0000 | MEDICATED_PAD | Freq: Every day | CUTANEOUS | Status: DC
  Administered 2023-05-04 – 2023-05-07 (×4): 6 via TOPICAL

## 2023-05-04 MED ORDER — ACETAMINOPHEN 325 MG PO TABS
650.0000 mg | ORAL_TABLET | Freq: Four times a day (QID) | ORAL | Status: DC | PRN
  Administered 2023-05-06: 650 mg via ORAL
  Filled 2023-05-04: qty 2

## 2023-05-04 MED ORDER — BOOST / RESOURCE BREEZE PO LIQD CUSTOM
1.0000 | Freq: Three times a day (TID) | ORAL | Status: DC
  Administered 2023-05-04 – 2023-05-06 (×4): 1 via ORAL

## 2023-05-04 NOTE — Progress Notes (Signed)
Hematology and Oncology Follow Up Visit  ELKANAH GOURNEAU [id number] 08/11/1945 77 y.o. 05/04/2023   Principle Diagnosis:  Lambda light chain myeloma --normal cytogenetics   Current Therapy:        Faspro/Velcade/Revlimid -- started on 11/05/2021 - monthly -- revlimid stopped in 09/2022.  All therapy stopped on 02/11/2023               Interim History:  Mr. Mercure is here today for follow-up.  He is not feeling all that great.  He feels tired.  He is little dizzy.  Unfortunately, we found that his hemoglobin is down to 6.3.  The white cell count is 3.9.  Platelet count 229,000.  He  says that he has had some rectal bleeding.  He has been constipated.  He is on aspirin and Eliquis.    I did do a rectal exam on him.  His stool was slightly dark and heme positive.  His stool was incredibly firm.  Unfortunately, he is going had to be admitted.  I really think you need to be off the Eliquis.  He may need to be monitored.  He is going need to have gastroenterology see him so they can do a upper and lower endoscopy on him.  We talked him about this.  We told him why we thought he needed to be admitted..  I really do not think his myeloma is a problem right now.  His lambda light chain when we last saw him was 1.4 mg/dL.  He does have low immunoglobulin levels however I do not think this is an issue right now.  He has had no fever.  He has had no rashes.  He still has this unexplained pain in his chest.  We did do a PET scan on him back in July.  This was negative for any obvious active bony lesions from myeloma.  Currently, I would have to say that his performance status is probably ECOG 1.  .  Medications:  Allergies as of 05/04/2023       Reactions   Codeine Nausea And Vomiting   Lisinopril Cough   Nsaids Other (See Comments)   CKD   Cymbalta [duloxetine Hcl] Other (See Comments)   Halucinations   Dapagliflozin Rash   Latex Hives        Medication List        Accurate as of  May 04, 2023  9:29 AM. If you have any questions, ask your nurse or doctor.          acetaminophen 500 MG tablet Commonly known as: TYLENOL Take 1,000 mg by mouth every 6 (six) hours as needed.   apixaban 5 MG Tabs tablet Commonly known as: Eliquis Take 1 tablet (5 mg total) by mouth 2 (two) times daily.   Aspirin Low Dose 81 MG chewable tablet Generic drug: aspirin Chew 1 tablet (81 mg total) by mouth daily.   BD Pen Needle Nano 2nd Gen 32G X 4 MM Misc Generic drug: Insulin Pen Needle Place 1 Needle onto the skin in the morning and at bedtime.   dofetilide 125 MCG capsule Commonly known as: TIKOSYN Take 1 capsule (125 mcg total) by mouth 2 (two) times daily.   ezetimibe 10 MG tablet Commonly known as: ZETIA Take 1 tablet (10 mg total) by mouth daily.   famciclovir 250 MG tablet Commonly known as: FAMVIR Take 1 tablet (250 mg total) by mouth daily.   gabapentin 300 MG capsule Commonly known as: NEURONTIN  Take 1 capsule (300 mg total) by mouth 3 (three) times daily.   GENTEAL OP Place 1 drop into both eyes daily as needed (dry eyes).   insulin glargine 100 UNIT/ML Solostar Pen Commonly known as: LANTUS Inject 30-40 Units into the skin 2 (two) times daily.   loratadine 10 MG tablet Commonly known as: CLARITIN Take 10 mg by mouth daily as needed for allergies.   metoprolol tartrate 25 MG tablet Commonly known as: LOPRESSOR Take 0.5 tablets (12.5 mg total) by mouth 2 (two) times daily.   nitroGLYCERIN 0.4 MG SL tablet Commonly known as: NITROSTAT Place 0.4 mg under the tongue every 5 (five) minutes as needed for chest pain.   olmesartan 5 MG tablet Commonly known as: BENICAR Take 5 mg by mouth daily.   ondansetron 8 MG tablet Commonly known as: ZOFRAN Take 1 tablet (8 mg total) by mouth every 8 (eight) hours as needed for nausea or vomiting.   OneTouch Delica Plus Lancet33G Misc USE UP TO FOUR TIMES DAILY AS DIRECTED   OneTouch Verio test  strip Generic drug: glucose blood USE UP TO FOUR TIMES DAILY AS DIRECTED.   pantoprazole 40 MG tablet Commonly known as: PROTONIX Take 1 tablet (40 mg total) by mouth daily.   polyethylene glycol powder 17 GM/SCOOP powder Commonly known as: GLYCOLAX/MIRALAX Take by mouth every other day.   rosuvastatin 20 MG tablet Commonly known as: CRESTOR Take 1 tablet (20 mg total) by mouth at bedtime.   traZODone 50 MG tablet Commonly known as: DESYREL Take 0.5-1 tablets (25-50 mg total) by mouth at bedtime as needed for sleep.        Allergies:  Allergies  Allergen Reactions   Codeine Nausea And Vomiting   Lisinopril Cough   Nsaids Other (See Comments)    CKD   Cymbalta [Duloxetine Hcl] Other (See Comments)    Halucinations   Dapagliflozin Rash   Latex Hives    Past Medical History, Surgical history, Social history, and Family History were reviewed and updated.  Review of Systems: Review of Systems  Constitutional: Negative.   HENT: Negative.    Eyes: Negative.   Respiratory: Negative.    Cardiovascular: Negative.   Gastrointestinal: Negative.   Genitourinary: Negative.   Musculoskeletal:  Positive for joint pain and myalgias.  Skin: Negative.   Neurological: Negative.   Endo/Heme/Allergies: Negative.   Psychiatric/Behavioral: Negative.       Physical Exam:  height is 5\' 8"  (1.727 m) and weight is 166 lb (75.3 kg). His oral temperature is 97.7 F (36.5 C). His blood pressure is 161/57 (abnormal) and his pulse is 80. His respiration is 18 and oxygen saturation is 100%.   Wt Readings from Last 3 Encounters:  05/04/23 166 lb (75.3 kg)  05/02/23 161 lb (73 kg)  04/25/23 161 lb 6.4 oz (73.2 kg)   Physical Exam Vitals reviewed.  HENT:     Head: Normocephalic and atraumatic.  Eyes:     Pupils: Pupils are equal, round, and reactive to light.  Cardiovascular:     Rate and Rhythm: Normal rate and regular rhythm.     Heart sounds: Normal heart sounds.  Pulmonary:      Effort: Pulmonary effort is normal.     Breath sounds: Normal breath sounds.  Abdominal:     General: Bowel sounds are normal.     Palpations: Abdomen is soft.  Musculoskeletal:        General: No tenderness or deformity. Normal range of motion.  Cervical back: Normal range of motion.  Lymphadenopathy:     Cervical: No cervical adenopathy.  Skin:    General: Skin is warm and dry.     Findings: No erythema or rash.  Neurological:     Mental Status: He is alert and oriented to person, place, and time.  Psychiatric:        Behavior: Behavior normal.        Thought Content: Thought content normal.        Judgment: Judgment normal.     Lab Results  Component Value Date   WBC 3.9 (L) 05/04/2023   HGB 6.3 (LL) 05/04/2023   HCT 21.5 (L) 05/04/2023   MCV 86.7 05/04/2023   PLT 229 05/04/2023   Lab Results  Component Value Date   FERRITIN 45 10/28/2021   IRON 200 (H) 10/28/2021   TIBC 365 10/28/2021   UIBC 165 10/28/2021   IRONPCTSAT 55 (H) 10/28/2021   Lab Results  Component Value Date   RETICCTPCT 1.5 02/04/2021   RBC 2.48 (L) 05/04/2023   Lab Results  Component Value Date   KPAFRELGTCHN 13.0 03/25/2023   LAMBDASER 13.6 03/25/2023   KAPLAMBRATIO 0.96 03/25/2023   Lab Results  Component Value Date   IGGSERUM 211 (L) 03/25/2023   IGA 19 (L) 03/25/2023   IGMSERUM 5 (L) 03/25/2023   Lab Results  Component Value Date   TOTALPROTELP 5.4 (L) 03/25/2023   ALBUMINELP 3.4 03/25/2023   A1GS 0.3 03/25/2023   A2GS 0.7 03/25/2023   BETS 0.9 03/25/2023   GAMS 0.2 (L) 03/25/2023   MSPIKE Not Observed 03/25/2023   SPEI Comment 01/14/2023     Chemistry      Component Value Date/Time   NA 141 03/25/2023 0924   NA 140 08/31/2021 1026   K 4.4 03/25/2023 0924   CL 105 03/25/2023 0924   CO2 28 03/25/2023 0924   BUN 29 (H) 03/25/2023 0924   BUN 21 08/31/2021 1026   CREATININE 1.57 (H) 03/25/2023 0924      Component Value Date/Time   CALCIUM 9.2 03/25/2023 0924    ALKPHOS 49 03/25/2023 0924   AST 24 03/25/2023 0924   ALT 21 03/25/2023 0924   BILITOT 0.5 03/25/2023 0924       Impression and Plan: Mr. Franssen is a pleasant 77 yo caucasian gentleman with lambda light chains myeloma with mild renal insufficiency.  Again, he is going had to be admitted.  He is having some GI bleeding.  He is on baby aspirin Eliquis..  He has had a TIA.  His hemoglobin being this low certainly would potentially exacerbate his neurological symptoms.  I spoke to the ER doc at our ER.  They will see him.  They will have him transfer him over to [facility] for follow-up and for workup.  Again, he is off all therapy for his myeloma.  We will plan to see him in the hospital.  We will then figure out when to get him back depending on when he gets out of the hospital.  Josph Macho, MD 11/13/20249:29 AM

## 2023-05-04 NOTE — ED Notes (Signed)
[facility] Called for transport at [facility]

## 2023-05-04 NOTE — Consult Note (Addendum)
Consultation  Referring Provider: Dr. Robb Matar    Primary Care Physician:  Natalia Leatherwood, DO Primary Gastroenterologist: Dr. Christella Hartigan       Reason for Consultation: GI bleed            HPI:   Marvin Salinas is a 77 y.o. male with a past medical history as listed below including a flutter on Eliquis, light chain myeloma, who presented to the ED from the oncology office with concern for anemia and potential GI bleed.    Apparently patient was noted to have heme positive stool on office exam by Dr. Myna Hidalgo, he described dark and tarry looking stool for about a month with general fatigue.    Recent admission to the hospital in September for vertigo and TIA evaluation, already on Eliquis at that time started on aspirin per neurology consult.    Today, patient describes that he started with some black stools off and on about every other day about 2 months ago.  A week ago he had increased black diarrhea with lower abdominal cramping which lasted for about 2 to 3 days and then stop completely.  He did not have a bowel movement for a while then had a seminormal one and then returned to his black stools.  He did not think much of this but went to see Dr. Myna Hidalgo today who tested his hemoglobin told him it was way down.  Also did Hemoccult study which was positive.  Apparently had a lot of heartburn around the time of the diarrhea.  He does have meds at home which he takes as needed for heartburn and reflux.  He is on Eliquis, his last dose this morning 05/04/2023 as well as Aspirin.    Denies fever, chills or weight loss.  GI history: 10/24/2020 office visit-at that time taking iron faithfully 08/11/20 capsule endoscopy for IDA with small AVM at 27 minutes, nonbleeding, medium sized AVM at 33 minutes and probable small AVM at 2 hours and 42 minutes-discussed that if evidence for persistent blood loss would recommend enteroscopy with APC 05/13/2020 colonoscopy done for IDA with five 4-10 mm polyps in  descending and transverse colon 05/13/2020 EGD done for IDA with mild nonspecific gastritis and normal duodenum  Past Medical History:  Diagnosis Date   Acute kidney injury superimposed on chronic kidney disease (HCC) 10/12/2017   Atrial fibrillation with RVR (HCC) 10/27/2017   Back pain    Basilar artery stenosis    on chronic Plavix   Benign prostatic hypertrophy without urinary obstruction 04/17/2014   Carotid artery disease (HCC) 10/06/2010   Carotid US 04/2019: Bilat ICA 40-59; L subclavian stenosis  // Carotid US 11/21: Bilat ICA 40-59; bilateral subclavian stenosis // Carotid US 11/22: Bilateral ICA 40-59; left subclavian stenosis // Carotid US 04/28/2022: Bilateral ICA 40-59; left subclavian stenosis   Cerebral vascular disease    with prior TIA's; followed by Dr. Sandria Manly   Depression, neurotic 11/21/2013   Diabetes mellitus    on insulin   Enthesopathy of ankle and tarsus 09/04/2007   Overview:  Metatarsalgia    Enthesopathy of ankle and tarsus 09/04/2007   Overview:  Metatarsalgia  Formatting of this note might be different from the original. Metatarsalgia  10/1 IMO update   History of renal calculi    Hyperlipidemia    Hypertension    Ischemic heart disease    prior PCI to RCA in 1989. S/P PCI to LAD and OM in 1992. S/P PCI to first  DX in 2000. S/P CABG x 3 in May 2011   Lambda light chain myeloma (HCC) 10/28/2021   Left hip pain 01/03/2020   OSA (obstructive sleep apnea) 01/11/2018   AHI 18.1 and SaO2 low 73%   PAD (peripheral artery disease) (HCC) 11/18/2017   ABIs/Arterial US 04/2019: R 1.21; L 0.66 // R SFA 30-49, stable > 50 CIA and EIA stenosis; L > 50 CIA stenosis (likely represents severe stenosis or short segment occlusion)   Peripheral neuropathy    Pneumonia 02/2011   Sacroiliac joint dysfunction of left side 01/03/2020   Stroke (HCC) 04/16/2001   small right cerebellar infarct on 04/16/2001 at that time he was found to have proximal left vertebral artery,  proximal left common carotid artery and both external carotid artery stenosis as well as intracranial stenosis involving mid basilar artery- 07/2011 add questionable TIA    Past Surgical History:  Procedure Laterality Date    NASAL ENDOSCOPY  01/11/2020   chronic rhinitis w/o evidence of acute sinusitis, bilateral inferior turbinate hypertrophy   ABDOMINAL AORTOGRAM W/LOWER EXTREMITY Bilateral 05/30/2019   Procedure: ABDOMINAL AORTOGRAM W/LOWER EXTREMITY;  Surgeon: Iran Ouch, MD;  Location: [facility];  Service: Cardiovascular;  Laterality: Bilateral;   ANGIOPLASTY  1989   right coronary artery   ANGIOPLASTY  1992   LAD and OM   ANGIOPLASTY  1998   First DX   BRAIN SURGERY     on prior records   CORONARY ARTERY BYPASS GRAFT  11/12/2009   LIMA to LAD, SVG to OM and SVG to RCA   CORONARY STENT PLACEMENT  2000   Stent to LAD/Circumflex with angioplasty to first diagonal    LEFT HEART CATH AND CORS/GRAFTS ANGIOGRAPHY N/A 10/13/2017   Procedure: LEFT HEART CATH AND CORS/GRAFTS ANGIOGRAPHY;  Surgeon: Yvonne Kendall, MD;  Location: [facility];  Service: Cardiovascular;  Laterality: N/A;    Family History  Problem Relation Age of Onset   Depression Mother    Early death Mother    Kidney disease Mother    Ovarian cancer Mother    Hypertension Father    Heart disease Father    Heart attack Father    Asthma Brother    Diabetes Brother    Stroke Other        Uncle   Diabetes Sister    Colon cancer Neg Hx    Rectal cancer Neg Hx    Stomach cancer Neg Hx    Esophageal cancer Neg Hx     Social History   Tobacco Use   Smoking status: Former    Current packs/day: 0.00    Average packs/day: 0.3 packs/day for 54.0 years (13.5 ttl pk-yrs)    Types: Cigarettes    Start date: 06/29/1968    Quit date: 06/29/2022    Years since quitting: 0.8   Smokeless tobacco: Never  Vaping Use   Vaping status: Never Used  Substance Use Topics   Alcohol use: Never   Drug use:  No    Prior to Admission medications   Medication Sig Start Date End Date Taking? Authorizing Provider  acetaminophen (TYLENOL) 500 MG tablet Take 1,000 mg by mouth every 6 (six) hours as needed for mild pain (pain score 1-3) or moderate pain (pain score 4-6).   Yes [provider]  apixaban (ELIQUIS) 5 MG TABS tablet Take 1 tablet (5 mg total) by mouth 2 (two) times daily. 03/23/23  Yes Iran Ouch, MD  aspirin 81 MG  chewable tablet Chew 1 tablet (81 mg total) by mouth daily. 03/18/23  Yes Leroy Sea, MD  BD PEN NEEDLE NANO 2ND GEN 32G X 4 MM MISC Place 1 Needle onto the skin in the morning and at bedtime. 12/20/22  Yes [provider]  Carboxymethylcell-Hypromellose (GENTEAL OP) Place 1 drop into both eyes daily as needed (dry eyes).   Yes [provider]  dofetilide (TIKOSYN) 125 MCG capsule Take 1 capsule (125 mcg total) by mouth 2 (two) times daily. 01/20/23  Yes [name], Deloris Ping, MD  ezetimibe (ZETIA) 10 MG tablet Take 1 tablet (10 mg total) by mouth daily. 03/28/23  Yes Kuneff, Renee A, DO  famciclovir (FAMVIR) 250 MG tablet Take 1 tablet (250 mg total) by mouth daily. 10/22/22  Yes Josph Macho, MD  gabapentin (NEURONTIN) 300 MG capsule Take 1 capsule (300 mg total) by mouth 3 (three) times daily. 02/25/23  Yes Ennever, Rose Phi, MD  glucose blood (ONETOUCH VERIO) test strip USE UP TO FOUR TIMES DAILY AS DIRECTED. 03/03/23  Yes Kuneff, Renee A, DO  insulin glargine (LANTUS) 100 UNIT/ML Solostar Pen Inject 30-40 Units into the skin 2 (two) times daily. 03/28/23  Yes Kuneff, Renee A, DO  Lancets (ONETOUCH DELICA PLUS LANCET33G) MISC USE UP TO FOUR TIMES DAILY AS DIRECTED 10/28/22  Yes Kuneff, Renee A, DO  loratadine (CLARITIN) 10 MG tablet Take 10 mg by mouth daily as needed for allergies.   Yes [provider]  metoprolol tartrate (LOPRESSOR) 25 MG tablet Take 0.5 tablets (12.5 mg total) by mouth 2 (two) times daily. 10/26/22  Yes [name], Deloris Ping, MD   nitroGLYCERIN (NITROSTAT) 0.4 MG SL tablet Place 0.4 mg under the tongue every 5 (five) minutes as needed for chest pain.   Yes [provider]  olmesartan (BENICAR) 5 MG tablet Take 5 mg by mouth daily. 02/24/23  Yes [provider]  ondansetron (ZOFRAN) 8 MG tablet Take 1 tablet (8 mg total) by mouth every 8 (eight) hours as needed for nausea or vomiting. 05/04/23  Yes Ennever, Rose Phi, MD  polyethylene glycol powder (GLYCOLAX/MIRALAX) 17 GM/SCOOP powder Take 1 Container by mouth daily.   Yes [provider]  rosuvastatin (CRESTOR) 20 MG tablet Take 1 tablet (20 mg total) by mouth at bedtime. 03/28/23  Yes Kuneff, Renee A, DO  traZODone (DESYREL) 50 MG tablet Take 0.5-1 tablets (25-50 mg total) by mouth at bedtime as needed for sleep. 03/28/23  Yes Kuneff, Renee A, DO  pantoprazole (PROTONIX) 40 MG tablet Take 1 tablet (40 mg total) by mouth daily. Patient not taking: Reported on 05/04/2023 03/18/23   Leroy Sea, MD  prochlorperazine (COMPAZINE) 10 MG tablet Take 1 tablet (10 mg total) by mouth every 6 (six) hours as needed (Nausea or vomiting). 11/03/21 02/17/22  Josph Macho, MD    Current Facility-Administered Medications  Medication Dose Route Frequency Provider Last Rate Last Admin   0.9 %  sodium chloride infusion (Manually program via Guardrails IV Fluids)   Intravenous Once Bobette Mo, MD       acetaminophen (TYLENOL) tablet 650 mg  650 mg Oral Q6H PRN Bobette Mo, MD       Or   acetaminophen (TYLENOL) suppository 650 mg  650 mg Rectal Q6H PRN Bobette Mo, MD       ondansetron [facility]) tablet 4 mg  4 mg Oral Q6H PRN Bobette Mo, MD       Or   ondansetron (  ZOFRAN) injection 4 mg  4 mg Intravenous Q6H PRN Bobette Mo, MD       Current Outpatient Medications  Medication Sig Dispense Refill   acetaminophen (TYLENOL) 500 MG tablet Take 1,000 mg by mouth every 6 (six) hours as needed for mild pain (pain score 1-3) or  moderate pain (pain score 4-6).     apixaban (ELIQUIS) 5 MG TABS tablet Take 1 tablet (5 mg total) by mouth 2 (two) times daily. 180 tablet 1   aspirin 81 MG chewable tablet Chew 1 tablet (81 mg total) by mouth daily. 90 tablet 0   BD PEN NEEDLE NANO 2ND GEN 32G X 4 MM MISC Place 1 Needle onto the skin in the morning and at bedtime.     Carboxymethylcell-Hypromellose (GENTEAL OP) Place 1 drop into both eyes daily as needed (dry eyes).     dofetilide (TIKOSYN) 125 MCG capsule Take 1 capsule (125 mcg total) by mouth 2 (two) times daily. 180 capsule 2   ezetimibe (ZETIA) 10 MG tablet Take 1 tablet (10 mg total) by mouth daily. 90 tablet 3   famciclovir (FAMVIR) 250 MG tablet Take 1 tablet (250 mg total) by mouth daily. 30 tablet 12   gabapentin (NEURONTIN) 300 MG capsule Take 1 capsule (300 mg total) by mouth 3 (three) times daily. 90 capsule 3   glucose blood (ONETOUCH VERIO) test strip USE UP TO FOUR TIMES DAILY AS DIRECTED. 100 strip 3   insulin glargine (LANTUS) 100 UNIT/ML Solostar Pen Inject 30-40 Units into the skin 2 (two) times daily. 90 mL 1   Lancets (ONETOUCH DELICA PLUS LANCET33G) MISC USE UP TO FOUR TIMES DAILY AS DIRECTED 100 each 11   loratadine (CLARITIN) 10 MG tablet Take 10 mg by mouth daily as needed for allergies.     metoprolol tartrate (LOPRESSOR) 25 MG tablet Take 0.5 tablets (12.5 mg total) by mouth 2 (two) times daily. 90 tablet 3   nitroGLYCERIN (NITROSTAT) 0.4 MG SL tablet Place 0.4 mg under the tongue every 5 (five) minutes as needed for chest pain.     olmesartan (BENICAR) 5 MG tablet Take 5 mg by mouth daily.     ondansetron (ZOFRAN) 8 MG tablet Take 1 tablet (8 mg total) by mouth every 8 (eight) hours as needed for nausea or vomiting. 30 tablet 3   polyethylene glycol powder (GLYCOLAX/MIRALAX) 17 GM/SCOOP powder Take 1 Container by mouth daily.     rosuvastatin (CRESTOR) 20 MG tablet Take 1 tablet (20 mg total) by mouth at bedtime. 90 tablet 3   traZODone (DESYREL) 50  MG tablet Take 0.5-1 tablets (25-50 mg total) by mouth at bedtime as needed for sleep. 90 tablet 1   pantoprazole (PROTONIX) 40 MG tablet Take 1 tablet (40 mg total) by mouth daily. (Patient not taking: Reported on 05/04/2023) 30 tablet 0    Allergies as of 05/04/2023 - Review Complete 05/04/2023  Allergen Reaction Noted   Codeine Nausea And Vomiting 10/06/2010   Lisinopril Cough 10/06/2010   Nsaids Other (See Comments) 07/13/2016   Cymbalta [duloxetine hcl] Other (See Comments) 03/25/2023   Dapagliflozin Rash 11/15/2021   Latex Hives 10/06/2010     Review of Systems:    Constitutional: No weight loss, fever or chills Skin: No rash  Cardiovascular: No chest pain  Respiratory: No SOB Gastrointestinal: See HPI and otherwise negative Genitourinary: No dysuria Neurological: No headache, dizziness or syncope Musculoskeletal: No new muscle or joint pain Hematologic: No bruising Psychiatric: No history of depression or  anxiety    Physical Exam:  Vital signs in last 24 hours: Temp:  [97.7 F (36.5 C)-98.4 F (36.9 C)] 98 F (36.7 C) (11/13 1304) Pulse Rate:  [68-80] 80 (11/13 1304) Resp:  [14-37] 18 (11/13 1304) BP: (112-162)/(35-99) 112/99 (11/13 1304) SpO2:  [93 %-100 %] 93 % (11/13 1304) Weight:  [75.3 kg] 75.3 kg (11/13 0959)   General:   Pleasant Caucasian male appears to be in NAD, Well developed, Well nourished, alert and cooperative Head:  Normocephalic and atraumatic. Eyes:   PEERL, EOMI. No icterus. Conjunctiva pink. Ears:  Normal auditory acuity. Neck:  Supple Throat: Oral cavity and pharynx without inflammation, swelling or lesion. Teeth in good condition. Lungs: Respirations even and unlabored. Lungs clear to auscultation bilaterally.   No wheezes, crackles, or rhonchi.  Heart: Normal S1, S2. No MRG. Regular rate and rhythm. No peripheral edema, cyanosis or pallor.  Abdomen:  Soft, nondistended, nontender. No rebound or guarding. Normal bowel sounds. No appreciable  masses or hepatomegaly. Rectal:  Not performed.  Msk:  Symmetrical without gross deformities. Peripheral pulses intact.  Extremities:  Without edema, no deformity or joint abnormality. Normal ROM, normal sensation. Neurologic:  Alert and  oriented x4;  grossly normal neurologically.  Skin:   Dry and intact without significant lesions or rashes. Psychiatric: Demonstrates good judgement and reason without abnormal affect or behaviors.   LAB RESULTS: Recent Labs    05/04/23 0852 05/04/23 1018  WBC 3.9* 4.4  HGB 6.3* 6.1*  HCT 21.5* 20.1*  PLT 229 232   BMET Recent Labs    05/04/23 0852 05/04/23 1018  NA 139 138  K 4.1 4.1  CL 108 107  CO2 25 24  GLUCOSE 97 104*  BUN 16 17  CREATININE 1.53* 1.52*  CALCIUM 8.4* 8.4*   LFT Recent Labs    05/04/23 1018  PROT 5.7*  ALBUMIN 3.7  AST 23  ALT 25  ALKPHOS 43  BILITOT 0.5     Impression / Plan:   Impression: 1.  Acute on chronic iron deficiency anemia: Hemoglobin 6.3--> 6.1 (9.6 on 03/25/2023), MCV normal, MCH low, BUN normal, description of black stools, history of EGD and colonoscopy in 2021 with no etiology found, pill capsule endoscopy with 3 AVMs in the small bowel; consider bleeding from small bowel most likely 2.  Multiple myeloma: Chemotherapy stopped 02/11/2023 3.  A-flutter: On Eliquis, last dose this morning 11/13  Plan: 1.  Will plan for enteroscopy tomorrow with Dr. Meridee Score.  Did discuss risks and benefits with the patient and he agrees to proceed. 2.  Continue to monitor hemoglobin and transfusion as needed less than 7 3.  Clear liquid diet today and n.p.o. at midnight 4.  Hold Eliquis 5. Started patient on Protonix 40mg  PO BID   Thank you for your kind consultation, we will continue to follow.  Violet Baldy Sha Amer  05/04/2023, 2:25 PM

## 2023-05-04 NOTE — Progress Notes (Signed)
Lab brought a panic HGB of 6.3 to my attention. MD notified of results. Orders received. Lenn Sink, RN

## 2023-05-04 NOTE — Progress Notes (Signed)
[facility]  Briefly-Dr. Renaye Rakers at [facility] called me.  They are planning on transferring the patient to either [facility] or [facility] for melena and acute blood loss anemia.  Discussed that we would see the patient wherever he lands as he is a [facility] patient.  Please call our service when he arrives in the hospital so we can do a formal consult.  [facility] is being covered by myself, Hyacinth Meeker, PA-C and [facility] is being covered by Willette Cluster, NP  Hyacinth Meeker, PA-C

## 2023-05-04 NOTE — ED Notes (Signed)
Blood consent signed

## 2023-05-04 NOTE — Progress Notes (Signed)
Plan of Care Note for accepted transfer   Patient: Marvin Salinas MRN: [id number]   DOA: 05/04/2023  Facility requesting transfer: [facility].  Requesting Provider: Alvester Chou, MD. Reason for transfer: Symptomatic anemia/Melena. Facility course:  Per Dr. Renaye Rakers: Chief Complaint  Patient presents with   Melena   Chest Pain    Marvin Salinas is a 77 y.o. male with history of A-flutter on Eliquis, light chain myeloma, presenting to the ED from the oncology office upstairs with concern for anemia, potential GI bleed.  Patient was noted to have heme positive stool on office exam per oral report given me by his oncologist, Dr. Arlan Organ.  Patient reports he has noted dark and tarry looking stool for about a month.  He reports general fatigue and not having energy.   Of note patient admission to the hospital for vertigo and TIA evaluation in September.  Patient was noted to already be on Eliquis at the time and was started on aspirin per neurology consultation.  CTA of the neck noted moderate bilateral ICA changes, with recommended outpatient vascular surgery follow-up.   Patient had colonoscopy and upper endoscopy performed at [facility] in 2021 per my review of external records, with 5 sessile polyps removed, gastritis findings of the stomach noted.  He also had an endoscopy as well as study and 2022 which noted 3 AVMs throughout the small bowel, to which were felt to be probably within reach of an enteroscope, but the more distal one 4 out of the reach of enteroscope per review of Dr. Rob Bunting MD GI note.   Diverticulosis noted on CT abdomen pelvis 10/28/2020.  Troponin I (High Sensitivity) [[id number]] (Abnormal)   Collected: 05/04/23 1018   Updated: 05/04/23 1103   Specimen Source: Vein    Troponin I (High Sensitivity) 43 High  ng/L  Comprehensive metabolic panel [[id number]] (Abnormal)   Collected: 05/04/23 1018   Updated: 05/04/23 1051   Specimen Type: Blood    Specimen Source: Vein    Sodium 138 mmol/L   Potassium 4.1 mmol/L   Chloride 107 mmol/L   CO2 24 mmol/L   Glucose, Bld 104 High  mg/dL   BUN 17 mg/dL   Creatinine, Ser 6.29 High  mg/dL   Calcium 8.4 Low  mg/dL   Total Protein 5.7 Low  g/dL   Albumin 3.7 g/dL   AST 23 U/L   ALT 25 U/L   Alkaline Phosphatase 43 U/L   Total Bilirubin 0.5 mg/dL   GFR, Estimated 47 Low  mL/min   Anion gap 7  CBC [[id number]] (Abnormal)   Collected: 05/04/23 1018   Updated: 05/04/23 1047   Specimen Type: Blood   Specimen Source: Vein    WBC 4.4 K/uL   RBC 2.36 Low  MIL/uL   Hemoglobin 6.1 Low Panic  g/dL   HCT 01.0 Low  %   MCV 85.2 fL   MCH 25.8 Low  pg   MCHC 30.3 g/dL   RDW 27.2 %   Platelets 232 K/uL   nRBC 0.0 %   Plan of care: The patient is accepted for admission to [facility] unit, at [facility]. HX of AVM on DOAC. Please obtain type and screen, order PRBC x2 and notify [facility] for evaluation. See epic for further information.  Author: Bobette Mo, MD 05/04/2023  Check [email or web address] for on-call coverage.  Nursing staff, Please call [facility] Admits & Consults System-Wide number on Amion as soon as patient's arrival, so  appropriate admitting provider can evaluate the pt.

## 2023-05-04 NOTE — ED Triage Notes (Addendum)
Pt has been having tarry stools for one month.  Pt seen by MD upstairs and was sent to ED for admission due to low hemoglobin.  Pt also admits to a burning sensation in his chest for 3 months.

## 2023-05-04 NOTE — H&P (Signed)
History and Physical   Patient: Marvin Salinas:[id number] DOB: [birth year] DOA: 05/04/2023 DOS: the patient was seen and examined on 05/04/2023 PCP: Natalia Leatherwood, DO  Patient coming from: Home  Chief Complaint:  Chief Complaint  Patient presents with   Melena   Chest Pain   HPI: Marvin Salinas is a 77 y.o. male with medical history significant of AKI, chronic atrial fibrillation on DOAC, basilar artery stenosis, carotid artery disease, PAD, coronary artery disease/CABG, nonsustained V. tach, BPH, history of CVA, lumbar radiculopathy, spinal stenosis, depression, type 2 diabetes, enthesopathy of ankle and tarsus, nephrolithiasis, hyperlipidemia, ischemic heart disease, left hip pain, obstructive sleep apnea, PAD, peripheral neuropathy, history of pneumonia, left side sacroiliac joint dysfunction, lambda light chain myeloma, chronic blood loss anemia due to small bowel AVMs noted on video capsule endoscopy who was following with Dr. Myna Hidalgo at the [facility] and was referred to the emergency department due to fatigue, melena, dyspnea and chest pain. He denied fever, chills, rhinorrhea, sore throat, wheezing or hemoptysis.  No palpitations, diaphoresis, PND, orthopnea or pitting edema of the lower extremities.  No abdominal pain, nausea, emesis, diarrhea, constipation, melena or hematochezia.  No flank pain, dysuria, frequency or hematuria.  No polyuria, polydipsia, polyphagia or blurred vision.   Lab work: CBC showed a white count of 4.4, hemoglobin 6.1 g/dL platelets 811.  Troponin was 43 then 33 ng/L.  CMP showed a total protein of 5.7 g/dL, glucose of 914, creatinine 1.52 and corrected calcium 8.7 mg/dL.  The rest of the electrolytes, BUN and the rest of the LFTs were normal.  ED course: Initial vital signs were temperature 98.4 F, pulse 71, respiration 14, BP 146/52 mmHg O2 sat 100% on room air.  The patient was transferred to the [facility] emergency department.  After he  arrived, I ordered type and screen with a 2 PRBC transfusion.   Review of Systems: As mentioned in the history of present illness. All other systems reviewed and are negative. Past Medical History:  Diagnosis Date   Acute kidney injury superimposed on chronic kidney disease (HCC) 10/12/2017   Atrial fibrillation with RVR (HCC) 10/27/2017   Back pain    Basilar artery stenosis    on chronic Plavix   Benign prostatic hypertrophy without urinary obstruction 04/17/2014   Carotid artery disease (HCC) 10/06/2010   Carotid US 04/2019: Bilat ICA 40-59; L subclavian stenosis  // Carotid US 11/21: Bilat ICA 40-59; bilateral subclavian stenosis // Carotid US 11/22: Bilateral ICA 40-59; left subclavian stenosis // Carotid US 04/28/2022: Bilateral ICA 40-59; left subclavian stenosis   Cerebral vascular disease    with prior TIA's; followed by Dr. Sandria Manly   Depression, neurotic 11/21/2013   Diabetes mellitus    on insulin   Enthesopathy of ankle and tarsus 09/04/2007   Overview:  Metatarsalgia    Enthesopathy of ankle and tarsus 09/04/2007   Overview:  Metatarsalgia  Formatting of this note might be different from the original. Metatarsalgia  10/1 IMO update   History of renal calculi    Hyperlipidemia    Hypertension    Ischemic heart disease    prior PCI to RCA in 1989. S/P PCI to LAD and OM in 1992. S/P PCI to first DX in 2000. S/P CABG x 3 in May 2011   Lambda light chain myeloma (HCC) 10/28/2021   Left hip pain 01/03/2020   OSA (obstructive sleep apnea) 01/11/2018   AHI 18.1 and SaO2 low 73%   PAD (peripheral  artery disease) (HCC) 11/18/2017   ABIs/Arterial US 04/2019: R 1.21; L 0.66 // R SFA 30-49, stable > 50 CIA and EIA stenosis; L > 50 CIA stenosis (likely represents severe stenosis or short segment occlusion)   Peripheral neuropathy    Pneumonia 02/2011   Sacroiliac joint dysfunction of left side 01/03/2020   Stroke (HCC) 04/16/2001   small right cerebellar infarct on 04/16/2001 at that  time he was found to have proximal left vertebral artery, proximal left common carotid artery and both external carotid artery stenosis as well as intracranial stenosis involving mid basilar artery- 07/2011 add questionable TIA   Past Surgical History:  Procedure Laterality Date    NASAL ENDOSCOPY  01/11/2020   chronic rhinitis w/o evidence of acute sinusitis, bilateral inferior turbinate hypertrophy   ABDOMINAL AORTOGRAM W/LOWER EXTREMITY Bilateral 05/30/2019   Procedure: ABDOMINAL AORTOGRAM W/LOWER EXTREMITY;  Surgeon: Iran Ouch, MD;  Location: [facility] INVASIVE [facility];  Service: Cardiovascular;  Laterality: Bilateral;   ANGIOPLASTY  1989   right coronary artery   ANGIOPLASTY  1992   LAD and OM   ANGIOPLASTY  1998   First DX   BRAIN SURGERY     on prior records   CORONARY ARTERY BYPASS GRAFT  11/12/2009   LIMA to LAD, SVG to OM and SVG to RCA   CORONARY STENT PLACEMENT  2000   Stent to LAD/Circumflex with angioplasty to first diagonal    LEFT HEART CATH AND CORS/GRAFTS ANGIOGRAPHY N/A 10/13/2017   Procedure: LEFT HEART CATH AND CORS/GRAFTS ANGIOGRAPHY;  Surgeon: Yvonne Kendall, MD;  Location: [facility] INVASIVE CV LAB;  Service: Cardiovascular;  Laterality: N/A;   Social History:  reports that he quit smoking about 10 months ago. His smoking use included cigarettes. He started smoking about 54 years ago. He has a 13.5 pack-year smoking history. He has never used smokeless tobacco. He reports that he does not drink alcohol and does not use drugs.  Allergies  Allergen Reactions   Codeine Nausea And Vomiting   Lisinopril Cough   Nsaids Other (See Comments)    CKD   Cymbalta [Duloxetine Hcl] Other (See Comments)    Halucinations   Dapagliflozin Rash   Latex Hives    Family History  Problem Relation Age of Onset   Depression Mother    Early death Mother    Kidney disease Mother    Ovarian cancer Mother    Hypertension Father    Heart disease Father    Heart attack Father     Asthma Brother    Diabetes Brother    Stroke Other        Uncle   Diabetes Sister    Colon cancer Neg Hx    Rectal cancer Neg Hx    Stomach cancer Neg Hx    Esophageal cancer Neg Hx     Prior to Admission medications   Medication Sig Start Date End Date Taking? Authorizing Provider  acetaminophen (TYLENOL) 500 MG tablet Take 1,000 mg by mouth every 6 (six) hours as needed for mild pain (pain score 1-3) or moderate pain (pain score 4-6).   Yes [provider]  apixaban (ELIQUIS) 5 MG TABS tablet Take 1 tablet (5 mg total) by mouth 2 (two) times daily. 03/23/23  Yes Iran Ouch, MD  aspirin 81 MG chewable tablet Chew 1 tablet (81 mg total) by mouth daily. 03/18/23  Yes Leroy Sea, MD  BD PEN NEEDLE NANO 2ND GEN 32G X 4 MM MISC Place 1  Needle onto the skin in the morning and at bedtime. 12/20/22  Yes [provider]  Carboxymethylcell-Hypromellose (GENTEAL OP) Place 1 drop into both eyes daily as needed (dry eyes).   Yes [provider]  dofetilide (TIKOSYN) 125 MCG capsule Take 1 capsule (125 mcg total) by mouth 2 (two) times daily. 01/20/23  Yes [name], Deloris Ping, MD  ezetimibe (ZETIA) 10 MG tablet Take 1 tablet (10 mg total) by mouth daily. 03/28/23  Yes Kuneff, Renee A, DO  famciclovir (FAMVIR) 250 MG tablet Take 1 tablet (250 mg total) by mouth daily. 10/22/22  Yes Josph Macho, MD  gabapentin (NEURONTIN) 300 MG capsule Take 1 capsule (300 mg total) by mouth 3 (three) times daily. 02/25/23  Yes Ennever, Rose Phi, MD  glucose blood (ONETOUCH VERIO) test strip USE UP TO FOUR TIMES DAILY AS DIRECTED. 03/03/23  Yes Kuneff, Renee A, DO  insulin glargine (LANTUS) 100 UNIT/ML Solostar Pen Inject 30-40 Units into the skin 2 (two) times daily. 03/28/23  Yes Kuneff, Renee A, DO  Lancets (ONETOUCH DELICA PLUS LANCET33G) MISC USE UP TO FOUR TIMES DAILY AS DIRECTED 10/28/22  Yes Kuneff, Renee A, DO  loratadine (CLARITIN) 10 MG tablet Take 10 mg by mouth daily as needed for  allergies.   Yes [provider]  metoprolol tartrate (LOPRESSOR) 25 MG tablet Take 0.5 tablets (12.5 mg total) by mouth 2 (two) times daily. 10/26/22  Yes [name], Deloris Ping, MD  nitroGLYCERIN (NITROSTAT) 0.4 MG SL tablet Place 0.4 mg under the tongue every 5 (five) minutes as needed for chest pain.   Yes [provider]  olmesartan (BENICAR) 5 MG tablet Take 5 mg by mouth daily. 02/24/23  Yes [provider]  ondansetron (ZOFRAN) 8 MG tablet Take 1 tablet (8 mg total) by mouth every 8 (eight) hours as needed for nausea or vomiting. 05/04/23  Yes Ennever, Rose Phi, MD  polyethylene glycol powder (GLYCOLAX/MIRALAX) 17 GM/SCOOP powder Take 1 Container by mouth daily.   Yes [provider]  rosuvastatin (CRESTOR) 20 MG tablet Take 1 tablet (20 mg total) by mouth at bedtime. 03/28/23  Yes Kuneff, Renee A, DO  traZODone (DESYREL) 50 MG tablet Take 0.5-1 tablets (25-50 mg total) by mouth at bedtime as needed for sleep. 03/28/23  Yes Kuneff, Renee A, DO  pantoprazole (PROTONIX) 40 MG tablet Take 1 tablet (40 mg total) by mouth daily. Patient not taking: Reported on 05/04/2023 03/18/23   Leroy Sea, MD  prochlorperazine (COMPAZINE) 10 MG tablet Take 1 tablet (10 mg total) by mouth every 6 (six) hours as needed (Nausea or vomiting). 11/03/21 02/17/22  Josph Macho, MD    Physical Exam: Vitals:   05/04/23 1200 05/04/23 1215 05/04/23 1216 05/04/23 1304  BP: (!) 146/84   (!) 112/99  Pulse: 74 78  80  Resp: 17 (!) 24 20 18   Temp:      SpO2: 100% 100%  93%  Weight:      Height:       Physical Exam Vitals and nursing note reviewed.  Constitutional:      General: He is awake. He is not in acute distress.    Appearance: He is well-developed. He is ill-appearing.  HENT:     Head: Normocephalic.     Nose: No rhinorrhea.     Mouth/Throat:     Mouth: Mucous membranes are moist.  Eyes:     General: No scleral icterus.    Pupils: Pupils are equal, round, and reactive  to light.  Neck:     Vascular: No JVD.  Cardiovascular:     Rate and Rhythm: Normal rate.     Heart sounds: S1 normal and S2 normal.  Pulmonary:     Breath sounds: No wheezing, rhonchi or rales.  Abdominal:     General: Bowel sounds are normal. There is no distension.     Palpations: Abdomen is soft.  Musculoskeletal:     Cervical back: Neck supple.     Left lower leg: No edema.  Skin:    General: Skin is warm and dry.     Coloration: Skin is pale.  Neurological:     General: No focal deficit present.     Mental Status: He is alert and oriented to person, place, and time.  Psychiatric:        Mood and Affect: Mood normal.        Behavior: Behavior normal. Behavior is cooperative.    Data Reviewed:  Results are pending, will review when available. 03/17/2023 transthoracic echocardiogram. IMPRESSIONS:   1. Left ventricular ejection fraction, by estimation, is 60 to 65%. The left ventricle has normal function. The left ventricle has no regional wall motion abnormalities. There is mild concentric left ventricular hypertrophy. Left ventricular diastolic parameters are indeterminate.  2. Right ventricular systolic function is normal. The right ventricular size is normal. Tricuspid regurgitation signal is inadequate for assessing PA pressure.  3. The mitral valve is normal in structure. Mild mitral valve regurgitation. No evidence of mitral stenosis.  4. The aortic valve is normal in structure. Aortic valve regurgitation is not visualized. Aortic valve sclerosis is present, with no evidence of aortic valve stenosis.  5. There is mild dilatation of the ascending aorta, measuring 41 mm.  6. The inferior vena cava is normal in size with greater than 50% respiratory variability, suggesting right atrial pressure of 3 mmHg.  7. Agitated saline contrast bubble study was negative, with no evidence of any interatrial shunt.  EKG: Vent. rate 72 BPM PR interval 151 ms QRS duration  89 ms QT/QTcB 427/468 ms P-R-T axes 57 -13 100 Sinus rhythm Abnormal R-wave progression, late transition Borderline repolarization abnormality  Assessment and Plan: Principal Problem:   ABLA (acute blood loss anemia) In the setting of:   Gastrointestinal hemorrhage with melena Admit to stepdown/inpatient. Clear liquid diet. Keep NPO for midnight. Continue pantoprazole 40 mg IVP twice daily. Continue octreotide infusion. Monitor H&H. Transfuse 2 units of PRBC. Transfuse further as needed. GI consult greatly appreciated.  Active Problems:   Hyperlipidemia Continue rosuvastatin 20 mg p.o. daily.    Chronic kidney disease (CKD), stage 3a (moderate) (HCC) Monitor renal function and electrolytes.    Controlled diabetes mellitus type 2 with complications (HCC) Currently on clear liquid diet. CBG monitoring with RI SS. Check hemoglobin A1c.    Diabetic neuropathy (HCC) Continue gabapentin 300 mg p.o. 3 times daily.    Paroxysmal atrial flutter (HCC) CHA?DS?-VASc Score Hold DOAC. Continue dofetilide 125 mg p.o. daily. Continue metoprolol 12.5 mg p.o. twice daily.    Hypertension Continue metoprolol 12.5 mg p.o. twice daily.    Lambda light chain myeloma (HCC) Continue prophylactic famciclovir or formulary equivalent. Follow-up with Dr. Myna Hidalgo as an outpatient.     Advance Care Planning:   Code Status: Full Code   Consults:  gastroenterology Corliss Parish, MD).  Family Communication:   Severity of Illness: The appropriate patient status for this patient is INPATIENT. Inpatient status is judged to be reasonable and necessary in  order to provide the required intensity of service to ensure the patient's safety. The patient's presenting symptoms, physical exam findings, and initial radiographic and laboratory data in the context of their chronic comorbidities is felt to place them at high risk for further clinical deterioration. Furthermore, it is not  anticipated that the patient will be medically stable for discharge from the hospital within 2 midnights of admission.   * I certify that at the point of admission it is my clinical judgment that the patient will require inpatient hospital care spanning beyond 2 midnights from the point of admission due to high intensity of service, high risk for further deterioration and high frequency of surveillance required.*  Author: Bobette Mo, MD 05/04/2023 1:14 PM  For on call review [email or web address].   This document was prepared using Dragon voice recognition software and may contain some unintended transcription errors.

## 2023-05-04 NOTE — ED Provider Notes (Signed)
[facility] Provider Note   CSN: [id number] Arrival date & time: 05/04/23  8657     History {Add pertinent medical, surgical, social history, OB history to HPI:1} Chief Complaint  Patient presents with   Melena   Chest Pain    Marvin Salinas is a 77 y.o. male with history of A-flutter on Eliquis, light chain myeloma, presenting to the ED from the oncology office upstairs with concern for anemia, potential GI bleed.  Patient was noted to have heme positive stool on office exam per oral report given me by his oncologist, Dr. Arlan Organ.  Patient reports he has noted dark and tarry looking stool for about a month.  He reports general fatigue and not having energy.  Of note patient admission to the hospital for vertigo and TIA evaluation in September.  Patient was noted to already be on Eliquis at the time and was started on aspirin per neurology consultation.  CTA of the neck noted moderate bilateral ICA changes, with recommended outpatient vascular surgery follow-up.  Patient had colonoscopy and upper endoscopy performed at [facility] in 2021 per my review of external records, with 5 sessile polyps removed, gastritis findings of the stomach noted.  He also had an endoscopy as well as study and 2022 which noted 3 AVMs throughout the small bowel, to which were felt to be probably within reach of an enteroscope, but the more distal one 4 out of the reach of enteroscope per review of Dr. Rob Bunting MD GI note.  Diverticulosis noted on CT abdomen pelvis 10/28/2020  HPI     Home Medications Prior to Admission medications   Medication Sig Start Date End Date Taking? Authorizing Provider  acetaminophen (TYLENOL) 500 MG tablet Take 1,000 mg by mouth every 6 (six) hours as needed.    [provider]  apixaban (ELIQUIS) 5 MG TABS tablet Take 1 tablet (5 mg total) by mouth 2 (two) times daily. 03/23/23   Iran Ouch, MD  aspirin 81  MG chewable tablet Chew 1 tablet (81 mg total) by mouth daily. 03/18/23   Leroy Sea, MD  BD PEN NEEDLE NANO 2ND GEN 32G X 4 MM MISC Place 1 Needle onto the skin in the morning and at bedtime. 12/20/22   [provider]  Carboxymethylcell-Hypromellose (GENTEAL OP) Place 1 drop into both eyes daily as needed (dry eyes).    [provider]  dofetilide (TIKOSYN) 125 MCG capsule Take 1 capsule (125 mcg total) by mouth 2 (two) times daily. 01/20/23   [name], Deloris Ping, MD  ezetimibe (ZETIA) 10 MG tablet Take 1 tablet (10 mg total) by mouth daily. 03/28/23   Kuneff, Renee A, DO  famciclovir (FAMVIR) 250 MG tablet Take 1 tablet (250 mg total) by mouth daily. 10/22/22   Josph Macho, MD  gabapentin (NEURONTIN) 300 MG capsule Take 1 capsule (300 mg total) by mouth 3 (three) times daily. 02/25/23   Josph Macho, MD  glucose blood (ONETOUCH VERIO) test strip USE UP TO FOUR TIMES DAILY AS DIRECTED. 03/03/23   Kuneff, Renee A, DO  insulin glargine (LANTUS) 100 UNIT/ML Solostar Pen Inject 30-40 Units into the skin 2 (two) times daily. 03/28/23   Kuneff, Renee A, DO  Lancets (ONETOUCH DELICA PLUS LANCET33G) MISC USE UP TO FOUR TIMES DAILY AS DIRECTED 10/28/22   Kuneff, Renee A, DO  loratadine (CLARITIN) 10 MG tablet Take 10 mg by mouth daily as needed for allergies.  [provider]  metoprolol tartrate (LOPRESSOR) 25 MG tablet Take 0.5 tablets (12.5 mg total) by mouth 2 (two) times daily. 10/26/22   [name], Deloris Ping, MD  nitroGLYCERIN (NITROSTAT) 0.4 MG SL tablet Place 0.4 mg under the tongue every 5 (five) minutes as needed for chest pain. Patient not taking: Reported on 05/02/2023    [provider]  olmesartan (BENICAR) 5 MG tablet Take 5 mg by mouth daily. Patient not taking: Reported on 05/02/2023 02/24/23   [provider]  ondansetron (ZOFRAN) 8 MG tablet Take 1 tablet (8 mg total) by mouth every 8 (eight) hours as needed for nausea or vomiting. 05/04/23   Josph Macho, MD  pantoprazole (PROTONIX) 40 MG tablet Take 1 tablet (40 mg total) by mouth daily. 03/18/23   Leroy Sea, MD  polyethylene glycol powder (GLYCOLAX/MIRALAX) 17 GM/SCOOP powder Take by mouth every other day.    [provider]  rosuvastatin (CRESTOR) 20 MG tablet Take 1 tablet (20 mg total) by mouth at bedtime. 03/28/23   Kuneff, Renee A, DO  traZODone (DESYREL) 50 MG tablet Take 0.5-1 tablets (25-50 mg total) by mouth at bedtime as needed for sleep. 03/28/23   Kuneff, Renee A, DO  prochlorperazine (COMPAZINE) 10 MG tablet Take 1 tablet (10 mg total) by mouth every 6 (six) hours as needed (Nausea or vomiting). 11/03/21 02/17/22  Josph Macho, MD      Allergies    Codeine, Lisinopril, Nsaids, Cymbalta [duloxetine hcl], Dapagliflozin, and Latex    Review of Systems   Review of Systems  Physical Exam Updated Vital Signs Ht 5\' 8"  (1.727 m)   Wt 75.3 kg   BMI 25.24 kg/m  Physical Exam Constitutional:      General: He is not in acute distress. HENT:     Head: Normocephalic and atraumatic.  Eyes:     Conjunctiva/sclera: Conjunctivae normal.     Pupils: Pupils are equal, round, and reactive to light.  Cardiovascular:     Rate and Rhythm: Normal rate. Rhythm irregular.  Pulmonary:     Effort: Pulmonary effort is normal. No respiratory distress.  Abdominal:     General: There is no distension.     Tenderness: There is no abdominal tenderness.  Skin:    General: Skin is warm and dry.  Neurological:     General: No focal deficit present.     Mental Status: He is alert. Mental status is at baseline.  Psychiatric:        Mood and Affect: Mood normal.        Behavior: Behavior normal.     ED Results / Procedures / Treatments   Labs (all labs ordered are listed, but only abnormal results are displayed) Labs Reviewed  COMPREHENSIVE METABOLIC PANEL  CBC  POC OCCULT BLOOD, ED  TROPONIN I (HIGH SENSITIVITY)    EKG EKG Interpretation Date/Time:  Wednesday  May 04 2023 10:03:53 EST Ventricular Rate:  72 PR Interval:  151 QRS Duration:  89 QT Interval:  427 QTC Calculation: 468 R Axis:   -13  Text Interpretation: Sinus rhythm Abnormal R-wave progression, late transition Borderline repolarization abnormality Confirmed by Alvester Chou [phone]) on 05/04/2023 10:21:56 AM  Radiology No results found.  Procedures Procedures  {Document cardiac monitor, telemetry assessment procedure when appropriate:1}  Medications Ordered in ED Medications - No data to display  ED Course/ Medical Decision Making/ A&P   {   Click here for ABCD2, HEART and other calculatorsREFRESH Note before signing :  1}                              Medical Decision Making Amount and/or Complexity of Data Reviewed Labs: ordered.  Risk Decision regarding hospitalization.   This patient presents to the ED with concern for low hemoglobin level, fatigue. This involves an extensive number of treatment options, and is a complaint that carries with it a high risk of complications and morbidity.  The differential diagnosis includes symptomatic anemia, most likely related to GI bleed, versus alternative cause  Co-morbidities that complicate the patient evaluation: Anticoagulation use, and higher risk of bleeding  Additional history obtained from oncologist by phone  External records from outside source obtained and reviewed including GI office records 2021 and 2022, endoscopy and colonoscopy report  Hgb 1 month ago 12.1 -> 9.6 -> 6.3  I ordered and personally interpreted labs.  The pertinent results include:  ***  The patient was maintained on a cardiac monitor.  I personally viewed and interpreted the cardiac monitored which showed an underlying rhythm of: Sinus rhythm  Per my interpretation the patient's ECG shows sinus rhythm with no acute ischemic findings  I have reviewed the patients home medicines and have made adjustments as needed  Test Considered: No  indication for emergent CT imaging of the abdomen at this time.  I requested consultation with the [facility],  and discussed lab and imaging findings as well as pertinent plan - they recommend: ***  After the interventions noted above, I reevaluated the patient and found that they have: {resolved/improved/worsened:23923::"improved"}  Social Determinants of Health:***  Dispostion:  After consideration of the diagnostic results and the patients response to treatment, I feel that the patent would benefit from ***.   {Document critical care time when appropriate:1} {Document review of labs and clinical decision tools ie heart score, Chads2Vasc2 etc:1}  {Document your independent review of radiology images, and any outside records:1} {Document your discussion with family members, caretakers, and with consultants:1} {Document social determinants of health affecting pt's care:1} {Document your decision making why or why not admission, treatments were needed:1} Final Clinical Impression(s) / ED Diagnoses Final diagnoses:  None    Rx / DC Orders ED Discharge Orders     None

## 2023-05-05 DIAGNOSIS — D62 Acute posthemorrhagic anemia: Secondary | ICD-10-CM

## 2023-05-05 DIAGNOSIS — R195 Other fecal abnormalities: Secondary | ICD-10-CM

## 2023-05-05 DIAGNOSIS — K921 Melena: Secondary | ICD-10-CM | POA: Diagnosis not present

## 2023-05-05 DIAGNOSIS — N183 Chronic kidney disease, stage 3 unspecified: Secondary | ICD-10-CM

## 2023-05-05 DIAGNOSIS — I779 Disorder of arteries and arterioles, unspecified: Secondary | ICD-10-CM | POA: Diagnosis not present

## 2023-05-05 DIAGNOSIS — N1831 Chronic kidney disease, stage 3a: Secondary | ICD-10-CM | POA: Diagnosis not present

## 2023-05-05 DIAGNOSIS — Z794 Long term (current) use of insulin: Secondary | ICD-10-CM

## 2023-05-05 DIAGNOSIS — E118 Type 2 diabetes mellitus with unspecified complications: Secondary | ICD-10-CM

## 2023-05-05 DIAGNOSIS — D649 Anemia, unspecified: Principal | ICD-10-CM

## 2023-05-05 LAB — GLUCOSE, CAPILLARY
Glucose-Capillary: 131 mg/dL — ABNORMAL HIGH (ref 70–99)
Glucose-Capillary: 69 mg/dL — ABNORMAL LOW (ref 70–99)
Glucose-Capillary: 90 mg/dL (ref 70–99)
Glucose-Capillary: 105 mg/dL — ABNORMAL HIGH (ref 70–99)
Glucose-Capillary: 175 mg/dL — ABNORMAL HIGH (ref 70–99)

## 2023-05-05 LAB — CBC
HCT: 20.1 % — ABNORMAL LOW (ref 39.0–52.0)
HCT: 25.6 % — ABNORMAL LOW (ref 39.0–52.0)
Hemoglobin: 6.1 g/dL — CL (ref 13.0–17.0)
Hemoglobin: 8.2 g/dL — ABNORMAL LOW (ref 13.0–17.0)
MCH: 25.8 pg — ABNORMAL LOW (ref 26.0–34.0)
MCH: 27.3 pg (ref 26.0–34.0)
MCHC: 30.3 g/dL (ref 30.0–36.0)
MCHC: 32 g/dL (ref 30.0–36.0)
MCV: 85.2 fL (ref 80.0–100.0)
MCV: 85.3 fL (ref 80.0–100.0)
Platelets: 232 10*3/uL (ref 150–400)
Platelets: 194 10*3/uL (ref 150–400)
RBC: 2.36 MIL/uL — ABNORMAL LOW (ref 4.22–5.81)
RBC: 3 MIL/uL — ABNORMAL LOW (ref 4.22–5.81)
RDW: 15.1 % (ref 11.5–15.5)
RDW: 14.3 % (ref 11.5–15.5)
WBC: 4.4 10*3/uL (ref 4.0–10.5)
WBC: 5 10*3/uL (ref 4.0–10.5)
nRBC: 0 % (ref 0.0–0.2)
nRBC: 0 % (ref 0.0–0.2)

## 2023-05-05 LAB — KAPPA/LAMBDA LIGHT CHAINS
Kappa free light chain: 14.8 mg/L (ref 3.3–19.4)
Kappa, lambda light chain ratio: 1.08 (ref 0.26–1.65)
Lambda free light chains: 13.7 mg/L (ref 5.7–26.3)

## 2023-05-05 LAB — COMPREHENSIVE METABOLIC PANEL
ALT: 19 U/L (ref 0–44)
AST: 16 U/L (ref 15–41)
Albumin: 3.3 g/dL — ABNORMAL LOW (ref 3.5–5.0)
Alkaline Phosphatase: 36 U/L — ABNORMAL LOW (ref 38–126)
Anion gap: 7 (ref 5–15)
BUN: 15 mg/dL (ref 8–23)
CO2: 24 mmol/L (ref 22–32)
Calcium: 8.6 mg/dL — ABNORMAL LOW (ref 8.9–10.3)
Chloride: 106 mmol/L (ref 98–111)
Creatinine, Ser: 1.31 mg/dL — ABNORMAL HIGH (ref 0.61–1.24)
GFR, Estimated: 56 mL/min — ABNORMAL LOW (ref >60)
Glucose, Bld: 76 mg/dL (ref 70–99)
Potassium: 4 mmol/L (ref 3.5–5.1)
Sodium: 137 mmol/L (ref 135–145)
Total Bilirubin: 1.8 mg/dL — ABNORMAL HIGH (ref <1.2)
Total Protein: 5.2 g/dL — ABNORMAL LOW (ref 6.5–8.1)

## 2023-05-05 LAB — PREPARE RBC (CROSSMATCH)

## 2023-05-05 LAB — IGG, IGA, IGM
IgA: 20 mg/dL — ABNORMAL LOW (ref 61–437)
IgG (Immunoglobin G), Serum: 178 mg/dL — ABNORMAL LOW (ref 603–1613)
IgM (Immunoglobulin M), Srm: 6 mg/dL — ABNORMAL LOW (ref 15–143)

## 2023-05-05 LAB — IRON AND TIBC
Iron: 127 ug/dL (ref 45–182)
Saturation Ratios: 25 % (ref 17.9–39.5)
TIBC: 515 ug/dL — ABNORMAL HIGH (ref 250–450)
UIBC: 388 ug/dL

## 2023-05-05 MED ORDER — DEXTROSE 50 % IV SOLN
12.5000 g | INTRAVENOUS | Status: AC
  Administered 2023-05-05: 12.5 g via INTRAVENOUS
  Filled 2023-05-05: qty 50

## 2023-05-05 MED ORDER — LACTULOSE 10 GM/15ML PO SOLN
20.0000 g | Freq: Two times a day (BID) | ORAL | Status: DC
Start: 2023-05-05 — End: 2023-05-07
  Administered 2023-05-05 – 2023-05-07 (×2): 20 g via ORAL
  Filled 2023-05-05 (×3): qty 30

## 2023-05-05 MED ORDER — IRBESARTAN 75 MG PO TABS
37.5000 mg | ORAL_TABLET | Freq: Every day | ORAL | Status: DC
  Administered 2023-05-05 – 2023-05-07 (×3): 37.5 mg via ORAL
  Filled 2023-05-05: qty 1
  Filled 2023-05-05: qty 0.5
  Filled 2023-05-05 (×2): qty 1

## 2023-05-05 MED ORDER — DEXTROSE 5 % IV SOLN
INTRAVENOUS | Status: DC
  Administered 2023-05-05: 40 mL/h via INTRAVENOUS

## 2023-05-05 NOTE — Assessment & Plan Note (Signed)
-   Hold Eliquis - Continue dofetilide, metoprolol

## 2023-05-05 NOTE — Assessment & Plan Note (Signed)
Creatinine stable relative to baseline 1.3-1.5

## 2023-05-05 NOTE — Hospital Course (Addendum)
Marvin Salinas is a 77 y.o. M with hx Aflutter and recent TIA on Tikosyn/Eliquis, MM, DM and CKD IIIa baseline 1.5 who was admitted from Oncology clinic for suspected GIB, Hgb 6.1 g/dL.

## 2023-05-05 NOTE — Assessment & Plan Note (Signed)
Continue gabapentin.

## 2023-05-05 NOTE — Consult Note (Signed)
Marvin Salinas is well-known to me.  He is very nice 77 year old white male with lambda light chain myeloma.  He has been off therapy now for about 3 months.  He has been doing well.  So far, there is been no evidence of his myeloma being active.  Came to the office yesterday.  He was not feeling well.  Is very weak.  His hemoglobin was 6.3.  He said he been constipated.  He said he noted some blood per rectum when he went to the bathroom.  We did a rectal exam on him.  His stool was rocksolid.  It was somewhat darker brown but heme positive.  Very subsequently has been admitted to the stepdown unit at [facility].  He has been seen by Gastroenterology.  They are planning on doing a endoscopic evaluation with I think capsule endoscopy.  He is on Eliquis.  He is also on baby aspirin.  He did have a TIA either about a month or so ago.  There are no labs that are back yet.  He does look better.  His color looks better.  He is not complain of any pain.  He has had no obvious melena or bright red blood per rectum.  He has not had a bowel movement as of yet.  His vital signs are temperature of 97.9.  Pulse 57.  Blood pressure 108/51.  Head and neck exam shows no ocular or oral lesions.  He has no adenopathy in the neck.  Lungs are clear bilaterally.  He has decent air movement bilaterally.  Cardiac exam regular rate and rhythm.  He has no murmurs.  Abdomen is soft.  There is no guarding or rebound tenderness.  There is no palpable abdominal mass.  There is no liver or spleen tip.  Extremities shows no clubbing, cyanosis or edema.  He does have some age-related osteoarthritic changes.  Neurological exam is nonfocal.   Marvin Salinas is a very nice 77 year old male.  He has myeloma.  His light chain myeloma.  He has done well with this.  We have him off therapy for right now.  He now has GI bleeding.  Apparently has had this in the past.  Gastroenterology is following along.  I think they are planning for a  capsule endoscopy to see if he has AVMs.  Apparently, he has had AVMs that were noted in the past.  We will follow along.  We will help out any way that we can.  I do appreciate the great care that he is getting from everybody in the ICU.   Christin Bach, MD  1 Thessalonians 5:16-18

## 2023-05-05 NOTE — Assessment & Plan Note (Signed)
-   Hold aspirin and Eliquis - Hold Zetia - Continue Crestor metoprolol

## 2023-05-05 NOTE — Assessment & Plan Note (Signed)
Transfused PRBCs on admission, Hgb 5.7 > 8.2 NO overt bleeding noted - Endoscopic evaluation per GI - Check iron studies and replete as appropriate

## 2023-05-05 NOTE — Assessment & Plan Note (Signed)
-   Continue famciclovir

## 2023-05-05 NOTE — H&P (View-Only) (Signed)
    Progress Note   Subjective  Hospital day #2 Chief Complaint:GI Bleed  Today, patient tells me that he has not passed any more black stools, he is very hungry and eager to have his procedure done today but aware that this may not happen.  No new complaints or concerns.  No nausea.   Objective   Vital signs in last 24 hours: Temp:  [97.6 F (36.4 C)-98.5 F (36.9 C)] 98.3 F (36.8 C) (11/14 0800) Pulse Rate:  [57-80] 64 (11/14 0723) Resp:  [0-37] 14 (11/14 0723) BP: (108-184)/(41-99) 153/53 (11/14 0723) SpO2:  [93 %-100 %] 96 % (11/14 0723) Weight:  [70.2 kg-75.3 kg] 70.2 kg (11/13 1801) Last BM Date :  (PTA) General:    white male in NAD Heart:  Regular rate and rhythm; no murmurs Lungs: Respirations even and unlabored, lungs CTA bilaterally Abdomen:  Soft, nontender and nondistended. Normal bowel sounds. Psych:  Cooperative. Normal mood and affect.  Intake/Output from previous day: 11/13 0701 - 11/14 0700 In: 765 [Blood:765] Out: 750 [Urine:750]  Lab Results: Recent Labs    05/04/23 0852 05/04/23 1018 05/04/23 1348 05/05/23 0617  WBC 3.9* 4.4  --  5.0  HGB 6.3* 6.1* 5.7* 8.2*  HCT 21.5* 20.1* 19.3* 25.6*  PLT 229 232  --  194   BMET Recent Labs    05/04/23 0852 05/04/23 1018 05/05/23 0617  NA 139 138 137  K 4.1 4.1 4.0  CL 108 107 106  CO2 25 24 24   GLUCOSE 97 104* 76  BUN 16 17 15   CREATININE 1.53* 1.52* 1.31*  CALCIUM 8.4* 8.4* 8.6*   LFT Recent Labs    05/05/23 0617  PROT 5.2*  ALBUMIN 3.3*  AST 16  ALT 19  ALKPHOS 36*  BILITOT 1.8*    Assessment / Plan:   Assessment: 1.  Acute on chronic iron deficiency anemia: Hemoglobin 6.3--> 1 (9.6 on 03/25/23)--> 2 units PRBCs--> 8.2, EGD and colonoscopy in 2021 with no etiology, pill capsule endoscopy of 3 AVMs in the small bowel, no further bleeding overnight, Eliquis on hold; likely small bowel AVMs 2.  Multiple myeloma: Chemotherapy stopped 02/11/2023 3.  A-flutter: On Eliquis, last dose the  morning of 11/13  Plan: 1.  Plan is for tentative enteroscopy today.  We do have a full endoscopic lab though, so there is a chance this may be moved to tomorrow.  He will stay n.p.o. for now.  We will see what things look like at 12/1:00.  If there are no plans for procedure today we will start him on a clear liquid diet.  Discussed this in detail. 2.  Continue to monitor hemoglobin and transfusion as needed less than 7 3.  Continue to hold Eliquis 4.  Continue Protonix 40 mg p.o. twice daily  Thank you for your kind consultation, we will continue to follow.   LOS: 1 day   Unk Lightning  05/05/2023, 9:47 AM  Addendum: 1:23 pm 11/14  Unfortunately due to endo time availability we will not be able to complete enteroscopy today.  Will allow full liquid today and NPO at midnight.  Case is on for tomorrow and will add possibility of capsule endocsopy in case we don't find anything. This was communicated to the patient yesterday, but he will need new consent completed. I have ordered this.  Hyacinth Meeker, PA-C

## 2023-05-05 NOTE — Assessment & Plan Note (Signed)
Slow timing of onset, lack of over bleeding implies slow oozing source (AVM, maybe gastritis)  EGD/Colon 11/21 showed gastritis, several <1cm polyps.  Capsule study in 2022 showed 3 AVMs. - Hold Eliquis - Continue IV PPI - Consult GI, appreciate expertise

## 2023-05-05 NOTE — Progress Notes (Addendum)
    Progress Note   Subjective  Hospital day #2 Chief Complaint:GI Bleed  Today, patient tells me that he has not passed any more black stools, he is very hungry and eager to have his procedure done today but aware that this may not happen.  No new complaints or concerns.  No nausea.   Objective   Vital signs in last 24 hours: Temp:  [97.6 F (36.4 C)-98.5 F (36.9 C)] 98.3 F (36.8 C) (11/14 0800) Pulse Rate:  [57-80] 64 (11/14 0723) Resp:  [0-37] 14 (11/14 0723) BP: (108-184)/(41-99) 153/53 (11/14 0723) SpO2:  [93 %-100 %] 96 % (11/14 0723) Weight:  [70.2 kg-75.3 kg] 70.2 kg (11/13 1801) Last BM Date :  (PTA) General:    white male in NAD Heart:  Regular rate and rhythm; no murmurs Lungs: Respirations even and unlabored, lungs CTA bilaterally Abdomen:  Soft, nontender and nondistended. Normal bowel sounds. Psych:  Cooperative. Normal mood and affect.  Intake/Output from previous day: 11/13 0701 - 11/14 0700 In: 765 [Blood:765] Out: 750 [Urine:750]  Lab Results: Recent Labs    05/04/23 0852 05/04/23 1018 05/04/23 1348 05/05/23 0617  WBC 3.9* 4.4  --  5.0  HGB 6.3* 6.1* 5.7* 8.2*  HCT 21.5* 20.1* 19.3* 25.6*  PLT 229 232  --  194   BMET Recent Labs    05/04/23 0852 05/04/23 1018 05/05/23 0617  NA 139 138 137  K 4.1 4.1 4.0  CL 108 107 106  CO2 25 24 24   GLUCOSE 97 104* 76  BUN 16 17 15   CREATININE 1.53* 1.52* 1.31*  CALCIUM 8.4* 8.4* 8.6*   LFT Recent Labs    05/05/23 0617  PROT 5.2*  ALBUMIN 3.3*  AST 16  ALT 19  ALKPHOS 36*  BILITOT 1.8*    Assessment / Plan:   Assessment: 1.  Acute on chronic iron deficiency anemia: Hemoglobin 6.3--> 1 (9.6 on 03/25/23)--> 2 units PRBCs--> 8.2, EGD and colonoscopy in 2021 with no etiology, pill capsule endoscopy of 3 AVMs in the small bowel, no further bleeding overnight, Eliquis on hold; likely small bowel AVMs 2.  Multiple myeloma: Chemotherapy stopped 02/11/2023 3.  A-flutter: On Eliquis, last dose the  morning of 11/13  Plan: 1.  Plan is for tentative enteroscopy today.  We do have a full endoscopic lab though, so there is a chance this may be moved to tomorrow.  He will stay n.p.o. for now.  We will see what things look like at 12/1:00.  If there are no plans for procedure today we will start him on a clear liquid diet.  Discussed this in detail. 2.  Continue to monitor hemoglobin and transfusion as needed less than 7 3.  Continue to hold Eliquis 4.  Continue Protonix 40 mg p.o. twice daily  Thank you for your kind consultation, we will continue to follow.   LOS: 1 day   Unk Lightning  05/05/2023, 9:47 AM  Addendum: 1:23 pm 11/14  Unfortunately due to endo time availability we will not be able to complete enteroscopy today.  Will allow full liquid today and NPO at midnight.  Case is on for tomorrow and will add possibility of capsule endocsopy in case we don't find anything. This was communicated to the patient yesterday, but he will need new consent completed. I have ordered this.  Hyacinth Meeker, PA-C

## 2023-05-05 NOTE — Progress Notes (Signed)
Hypoglycemic Event  CBG: 69  Treatment: D50 25 mL (12.5 gm)  Symptoms: None  Follow-up CBG: [id number] CBG Result: 131  Possible Reasons for Event: Inadequate meal intake  Comments/MD notified: Primary team informed.    Silverio Decamp

## 2023-05-05 NOTE — Assessment & Plan Note (Signed)
Hemoglobin A1c 7% last month.  Glucoses here low overnight - Sliding scale corrections - Continue gabapentin - Hold glargine

## 2023-05-05 NOTE — Assessment & Plan Note (Signed)
-   Continue Crestor - Hold Zetia

## 2023-05-05 NOTE — Assessment & Plan Note (Signed)
Blood pressure controlled - Continue metoprolol - Hold olmesartan

## 2023-05-05 NOTE — Progress Notes (Signed)
  Progress Note   Patient: Marvin Salinas ZOX:[id number] DOB: 17-Jan-1946 DOA: 05/04/2023     1 DOS: the patient was seen and examined on 05/05/2023 at 8:33 AM      Brief hospital course: Marvin Salinas is a 77 y.o. M with hx Aflutter and recent TIA on Tikosyn/Eliquis, MM, DM and CKD IIIa baseline 1.5 who was admitted from Oncology clinic for suspected GIB, Hgb 6.1 g/dL.     Assessment and Plan: * Gastrointestinal hemorrhage with melena Slow timing of onset, lack of over bleeding implies slow oozing source (AVM, maybe gastritis)  EGD/Colon 11/21 showed gastritis, several <1cm polyps.  Capsule study in 2022 showed 3 AVMs. - Hold Eliquis - Continue IV PPI - Consult GI, appreciate expertise    Likely iron deficiency anemia due to chronic blood loss Transfused PRBCs on admission, Hgb 5.7 > 8.2 NO overt bleeding noted - Endoscopic evaluation per GI - Check iron studies and replete as appropriate    Lambda light chain myeloma (HCC) - Continue famciclovir  Hypertension Blood pressure controlled - Continue metoprolol - Hold olmesartan  Paroxysmal atrial flutter (HCC) - Hold Eliquis - Continue dofetilide, metoprolol  Diabetic neuropathy (HCC) - Continue gabapentin  Controlled diabetes mellitus type 2 with complications (HCC) Hemoglobin A1c 7% last month.  Glucoses here low overnight - Sliding scale corrections - Continue gabapentin - Hold glargine  Chronic kidney disease (CKD), stage IIIa (moderate) (HCC) Creatinine stable relative to baseline 1.3-1.5  Hyperlipidemia - Continue Crestor - Hold Zetia  Carotid artery disease (HCC) - Hold aspirin and Eliquis - Hold Zetia - Continue Crestor metoprolol          Subjective: No bowel movements overnight, no vomiting, otherwise feels well, no concerns, no nursing concerns other than low glucose this morning.     Physical Exam: BP (!) 162/70 (BP Location: Right Arm)   Pulse 60   Temp 98.7 F (37.1 C) (Oral)    Resp 16   Ht 5\' 8"  (1.727 m)   Wt 70.2 kg   SpO2 100%   BMI 23.53 kg/m   No acute distress RRR, no murmurs, no peripheral edema Respiratory normal, lungs clear without rales or wheezes Abdomen soft without tenderness palpation or guarding, no ascites or distention Attention normal, affect appropriate, judgment and insight appear normal, face symmetric, speech fluent      Data Reviewed: Discussed GI team BMP notable for creatinine up to 1.3 CBC notable for hemoglobin up to 8.2   Family Communication: None present    Disposition: Status is: Inpatient         Author: Alberteen Sam, MD 05/05/2023 2:56 PM  For on call review [email or web address].

## 2023-05-06 ENCOUNTER — Inpatient Hospital Stay (HOSPITAL_COMMUNITY): Payer: 59 | Admitting: Anesthesiology

## 2023-05-06 ENCOUNTER — Encounter (HOSPITAL_COMMUNITY)
Admission: EM | Disposition: A | Discharge status: Home / Self Care | Payer: Self-pay | Source: Home / Self Care | Attending: Family Medicine

## 2023-05-06 ENCOUNTER — Encounter (HOSPITAL_COMMUNITY): Payer: Self-pay | Admitting: Family Medicine

## 2023-05-06 DIAGNOSIS — K921 Melena: Secondary | ICD-10-CM | POA: Diagnosis not present

## 2023-05-06 DIAGNOSIS — K259 Gastric ulcer, unspecified as acute or chronic, without hemorrhage or perforation: Secondary | ICD-10-CM

## 2023-05-06 DIAGNOSIS — N189 Chronic kidney disease, unspecified: Secondary | ICD-10-CM

## 2023-05-06 DIAGNOSIS — D649 Anemia, unspecified: Secondary | ICD-10-CM | POA: Diagnosis not present

## 2023-05-06 DIAGNOSIS — E1149 Type 2 diabetes mellitus with other diabetic neurological complication: Secondary | ICD-10-CM

## 2023-05-06 DIAGNOSIS — K5521 Angiodysplasia of colon with hemorrhage: Secondary | ICD-10-CM | POA: Diagnosis not present

## 2023-05-06 DIAGNOSIS — I129 Hypertensive chronic kidney disease with stage 1 through stage 4 chronic kidney disease, or unspecified chronic kidney disease: Secondary | ICD-10-CM

## 2023-05-06 DIAGNOSIS — E1122 Type 2 diabetes mellitus with diabetic chronic kidney disease: Secondary | ICD-10-CM

## 2023-05-06 DIAGNOSIS — D62 Acute posthemorrhagic anemia: Secondary | ICD-10-CM | POA: Diagnosis not present

## 2023-05-06 DIAGNOSIS — K5520 Angiodysplasia of colon without hemorrhage: Secondary | ICD-10-CM

## 2023-05-06 DIAGNOSIS — B9681 Helicobacter pylori [H. pylori] as the cause of diseases classified elsewhere: Secondary | ICD-10-CM

## 2023-05-06 HISTORY — PX: BIOPSY: SHX5522

## 2023-05-06 HISTORY — PX: SUBMUCOSAL TATTOO INJECTION: SHX6856

## 2023-05-06 HISTORY — PX: ENTEROSCOPY: SHX5533

## 2023-05-06 HISTORY — PX: HOT HEMOSTASIS: SHX5433

## 2023-05-06 LAB — CBC WITH DIFFERENTIAL/PLATELET
Abs Immature Granulocytes: 0 10*3/uL (ref 0.00–0.07)
Basophils Absolute: 0 10*3/uL (ref 0.0–0.1)
Basophils Relative: 0 %
Eosinophils Absolute: 0.1 10*3/uL (ref 0.0–0.5)
Eosinophils Relative: 2 %
HCT: 30.1 % — ABNORMAL LOW (ref 39.0–52.0)
Hemoglobin: 9.6 g/dL — ABNORMAL LOW (ref 13.0–17.0)
Immature Granulocytes: 0 %
Lymphocytes Relative: 25 %
Lymphs Abs: 1.2 10*3/uL (ref 0.7–4.0)
MCH: 27.1 pg (ref 26.0–34.0)
MCHC: 31.9 g/dL (ref 30.0–36.0)
MCV: 85 fL (ref 80.0–100.0)
Monocytes Absolute: 0.5 10*3/uL (ref 0.1–1.0)
Monocytes Relative: 10 %
Neutro Abs: 3 10*3/uL (ref 1.7–7.7)
Neutrophils Relative %: 63 %
Platelets: 230 10*3/uL (ref 150–400)
RBC: 3.54 MIL/uL — ABNORMAL LOW (ref 4.22–5.81)
RDW: 14.5 % (ref 11.5–15.5)
WBC: 4.6 10*3/uL (ref 4.0–10.5)
nRBC: 0 % (ref 0.0–0.2)

## 2023-05-06 LAB — COMPREHENSIVE METABOLIC PANEL
ALT: 24 U/L (ref 0–44)
AST: 31 U/L (ref 15–41)
Albumin: 3.8 g/dL (ref 3.5–5.0)
Alkaline Phosphatase: 42 U/L (ref 38–126)
Anion gap: 7 (ref 5–15)
BUN: 14 mg/dL (ref 8–23)
CO2: 25 mmol/L (ref 22–32)
Calcium: 9.1 mg/dL (ref 8.9–10.3)
Chloride: 105 mmol/L (ref 98–111)
Creatinine, Ser: 1.34 mg/dL — ABNORMAL HIGH (ref 0.61–1.24)
GFR, Estimated: 54 mL/min — ABNORMAL LOW (ref >60)
Glucose, Bld: 107 mg/dL — ABNORMAL HIGH (ref 70–99)
Potassium: 3.9 mmol/L (ref 3.5–5.1)
Sodium: 137 mmol/L (ref 135–145)
Total Bilirubin: 1 mg/dL (ref <1.2)
Total Protein: 5.7 g/dL — ABNORMAL LOW (ref 6.5–8.1)

## 2023-05-06 LAB — TYPE AND SCREEN
ABO/RH(D): O POS
Antibody Screen: POSITIVE
Unit division: 0
Unit division: 0

## 2023-05-06 LAB — GLUCOSE, CAPILLARY
Glucose-Capillary: 124 mg/dL — ABNORMAL HIGH (ref 70–99)
Glucose-Capillary: 100 mg/dL — ABNORMAL HIGH (ref 70–99)
Glucose-Capillary: 150 mg/dL — ABNORMAL HIGH (ref 70–99)
Glucose-Capillary: 168 mg/dL — ABNORMAL HIGH (ref 70–99)
Glucose-Capillary: 192 mg/dL — ABNORMAL HIGH (ref 70–99)

## 2023-05-06 LAB — BPAM RBC
Blood Product Expiration Date: 202412012359
Blood Product Expiration Date: 202412012359
ISSUE DATE / TIME: 202411132011
ISSUE DATE / TIME: 202411140013
Unit Type and Rh: 5100
Unit Type and Rh: 5100

## 2023-05-06 SURGERY — IMAGING PROCEDURE, GI TRACT, INTRALUMINAL, VIA CAPSULE
Anesthesia: Monitor Anesthesia Care

## 2023-05-06 SURGERY — ENTEROSCOPY
Anesthesia: Monitor Anesthesia Care

## 2023-05-06 MED ORDER — SPOT INK MARKER SYRINGE KIT
PACK | SUBMUCOSAL | Status: DC | PRN
  Administered 2023-05-06: 5 mL via SUBMUCOSAL

## 2023-05-06 MED ORDER — POTASSIUM CHLORIDE CRYS ER 20 MEQ PO TBCR
40.0000 meq | EXTENDED_RELEASE_TABLET | Freq: Once | ORAL | Status: AC
  Administered 2023-05-06: 40 meq via ORAL
  Filled 2023-05-06: qty 2

## 2023-05-06 MED ORDER — PHENYLEPHRINE HCL-NACL 20-0.9 MG/250ML-% IV SOLN
INTRAVENOUS | Status: AC
  Filled 2023-05-06: qty 250

## 2023-05-06 MED ORDER — PHENYLEPHRINE HCL-NACL 20-0.9 MG/250ML-% IV SOLN: INTRAVENOUS | Status: DC | PRN

## 2023-05-06 MED ORDER — PROPOFOL 10 MG/ML IV BOLUS
INTRAVENOUS | Status: DC | PRN
  Administered 2023-05-06: 50 mg via INTRAVENOUS

## 2023-05-06 MED ORDER — GLUCAGON HCL RDNA (DIAGNOSTIC) 1 MG IJ SOLR
INTRAMUSCULAR | Status: DC | PRN
  Administered 2023-05-06: .5 mg via INTRAVENOUS

## 2023-05-06 MED ORDER — PROPOFOL 500 MG/50ML IV EMUL
INTRAVENOUS | Status: DC | PRN
  Administered 2023-05-06: 200 ug/kg/min via INTRAVENOUS

## 2023-05-06 MED ORDER — EPHEDRINE SULFATE-NACL 50-0.9 MG/10ML-% IV SOSY
PREFILLED_SYRINGE | INTRAVENOUS | Status: DC | PRN
  Administered 2023-05-06: 10 mg via INTRAVENOUS

## 2023-05-06 MED ORDER — PHENYLEPHRINE HCL-NACL 20-0.9 MG/250ML-% IV SOLN
INTRAVENOUS | Status: DC | PRN
  Administered 2023-05-06: 80 ug via INTRAVENOUS
  Administered 2023-05-06: 160 ug via INTRAVENOUS
  Administered 2023-05-06: 120 ug via INTRAVENOUS
  Administered 2023-05-06 (×2): 160 ug via INTRAVENOUS

## 2023-05-06 MED ORDER — VASOPRESSIN 20 UNIT/ML IV SOLN
INTRAVENOUS | Status: AC
  Filled 2023-05-06: qty 1

## 2023-05-06 MED ORDER — FOLIC ACID 1 MG PO TABS
2.0000 mg | ORAL_TABLET | Freq: Every day | ORAL | Status: DC
Start: 2023-05-06 — End: 2023-05-07
  Administered 2023-05-07: 2 mg via ORAL
  Filled 2023-05-06: qty 2

## 2023-05-06 MED ORDER — SODIUM CHLORIDE 0.9 % IV SOLN
INTRAVENOUS | Status: DC | PRN
Start: 2023-05-06 — End: 2023-05-06

## 2023-05-06 MED ORDER — LIDOCAINE 2% (20 MG/ML) 5 ML SYRINGE
INTRAMUSCULAR | Status: DC | PRN
  Administered 2023-05-06: 80 mg via INTRAVENOUS

## 2023-05-06 MED ORDER — GLYCOPYRROLATE 0.2 MG/ML IJ SOLN
INTRAMUSCULAR | Status: DC | PRN
  Administered 2023-05-06: .2 mg via INTRAVENOUS

## 2023-05-06 MED ORDER — PROPOFOL 10 MG/ML IV BOLUS
INTRAVENOUS | Status: AC
  Filled 2023-05-06: qty 20

## 2023-05-06 NOTE — Plan of Care (Signed)

## 2023-05-06 NOTE — Progress Notes (Signed)
   05/06/23 1018  TOC Brief Assessment  Insurance and Status Reviewed  Patient has primary care physician Yes  Home environment has been reviewed home alone  Prior level of function: independent  Prior/Current Home Services No current home services  Social Determinants of Health Reivew SDOH reviewed no interventions necessary  Readmission risk has been reviewed Yes  Transition of care needs no transition of care needs at this time

## 2023-05-06 NOTE — Progress Notes (Signed)
Mobility Specialist - Progress Note   05/06/23 0949  Mobility  Activity Ambulated with assistance in hallway  Level of Assistance Modified independent, requires aide device or extra time  Assistive Device Cane  Distance Ambulated (ft) 500 ft  Activity Response Tolerated well  Mobility Referral Yes  $Mobility charge 1 Mobility  Mobility Specialist Start Time (ACUTE ONLY) 0849  Mobility Specialist Stop Time (ACUTE ONLY) 0856  Mobility Specialist Time Calculation (min) (ACUTE ONLY) 7 min   Pt received in bed and agreeable to mobility. No complaints during session. Pt to bed after session with all needs met.    [facility]

## 2023-05-06 NOTE — Progress Notes (Signed)
Patient reported having had 4 bowel movements since being this department. I asked about the color and consistancy. States, "they didn't have any blood in it and the color was medium brown". I asked patient to not flush the next time so that I could see and document his bowel movement in his chart. Patient stated, " I will".  Plan of care ongoing.

## 2023-05-06 NOTE — Transfer of Care (Signed)
Immediate Anesthesia Transfer of Care Note  Patient: Marvin Salinas  Procedure(s) Performed: ENTEROSCOPY HOT HEMOSTASIS (ARGON PLASMA COAGULATION/BICAP) SUBMUCOSAL TATTOO INJECTION BIOPSY  Patient Location: PACU  Anesthesia Type:MAC  Level of Consciousness: sedated  Airway & Oxygen Therapy: Patient Spontanous Breathing and Patient connected to face mask  Post-op Assessment: Report given to RN  Post vital signs: Reviewed and stable  Last Vitals:  Vitals Value Taken Time  BP 109/51 05/06/23 1220  Temp 36.2 C 05/06/23 1218  Pulse 56 05/06/23 1220  Resp 17 05/06/23 1221  SpO2 100 % 05/06/23 1220  Vitals shown include unfiled device data.  Last Pain:  Vitals:   05/06/23 1048  TempSrc: Temporal  PainSc: 4       Patients Stated Pain Goal: 2 (05/06/23 4696)  Complications: No notable events documented.

## 2023-05-06 NOTE — Interval H&P Note (Signed)
History and Physical Interval Note:  05/06/2023 11:27 AM  Marvin Salinas  has presented today for surgery, with the diagnosis of Gi Bleed.  The various methods of treatment have been discussed with the patient and family. After consideration of risks, benefits and other options for treatment, the patient has consented to  Procedure(s): ENTEROSCOPY (N/A) as a surgical intervention.  The patient's history has been reviewed, patient examined, no change in status, stable for surgery.  I have reviewed the patient's chart and labs.  Questions were answered to the patient's satisfaction.     Jenel Lucks

## 2023-05-06 NOTE — Anesthesia Postprocedure Evaluation (Signed)
Anesthesia Post Note  Patient: Marvin Salinas  Procedure(s) Performed: ENTEROSCOPY HOT HEMOSTASIS (ARGON PLASMA COAGULATION/BICAP) SUBMUCOSAL TATTOO INJECTION BIOPSY     Patient location during evaluation: PACU Anesthesia Type: MAC Level of consciousness: awake and alert Pain management: pain level controlled Vital Signs Assessment: post-procedure vital signs reviewed and stable Respiratory status: spontaneous breathing, nonlabored ventilation and respiratory function stable Cardiovascular status: blood pressure returned to baseline and stable Postop Assessment: no apparent nausea or vomiting Anesthetic complications: no   No notable events documented.  Last Vitals:  Vitals:   05/06/23 1310 05/06/23 1320  BP: (!) 148/46 (!) 154/50  Pulse: (!) 57 (!) 58  Resp: 17 19  Temp:    SpO2: 93% 92%    Last Pain:  Vitals:   05/06/23 1320  TempSrc:   PainSc: 0-No pain                 Lowella Curb

## 2023-05-06 NOTE — Anesthesia Preprocedure Evaluation (Signed)
Anesthesia Evaluation  Patient identified by MRN, date of birth, ID band Patient awake    Reviewed: Allergy & Precautions, H&P , NPO status , Patient's Chart, lab work & pertinent test results  Airway Mallampati: II  TM Distance: >3 FB Neck ROM: Full    Dental no notable dental hx.    Pulmonary sleep apnea , former smoker   Pulmonary exam normal breath sounds clear to auscultation       Cardiovascular hypertension, Pt. on medications + Past MI and + Peripheral Vascular Disease  Normal cardiovascular exam Rhythm:Regular Rate:Normal     Neuro/Psych    Depression    CVA  negative psych ROS   GI/Hepatic negative GI ROS, Neg liver ROS,,,  Endo/Other  negative endocrine ROSdiabetes, Type 2    Renal/GU Renal InsufficiencyRenal disease  negative genitourinary   Musculoskeletal negative musculoskeletal ROS (+)    Abdominal   Peds negative pediatric ROS (+)  Hematology  (+) Blood dyscrasia, anemia   Anesthesia Other Findings   Reproductive/Obstetrics negative OB ROS                             Anesthesia Physical Anesthesia Plan  ASA: 3  Anesthesia Plan: MAC   Post-op Pain Management: Minimal or no pain anticipated   Induction: Intravenous  PONV Risk Score and Plan: 1 and Propofol infusion and Treatment may vary due to age or medical condition  Airway Management Planned: Simple Face Mask  Additional Equipment:   Intra-op Plan:   Post-operative Plan:   Informed Consent: I have reviewed the patients History and Physical, chart, labs and discussed the procedure including the risks, benefits and alternatives for the proposed anesthesia with the patient or authorized representative who has indicated his/her understanding and acceptance.     Dental advisory given  Plan Discussed with: CRNA  Anesthesia Plan Comments:        Anesthesia Quick Evaluation

## 2023-05-06 NOTE — Op Note (Signed)
[facility] Patient Name: Marvin Salinas Procedure Date: 05/06/2023 MRN: [id number] Attending MD: Dub Amis. Tomasa Rand , MD, [id number] Date of Birth: February 02, 1946 CSN: [id number] Age: 77 Admit Type: Inpatient Procedure:                Small bowel enteroscopy Indications:              Arteriovenous malformation in the small intestine.                            Recurrent iron deficiency anemia. Previously                            underwent evaluation in 2022 with EGD, colonoscopy                            and VCE which showed mulitiple small bowel AVMs.                            Readmitted for symptomatic anemia with recent dark                            stools, hgb 6 on admission. Providers:                Dub Amis. Tomasa Rand, MD, Marge Duncans, RN,                            Kandice Robinsons, Technician Referring MD:              Medicines:                Monitored Anesthesia Care, Glucagon 0.5 mg IV Complications:            No immediate complications. Estimated Blood Loss:     Estimated blood loss was minimal. Procedure:                Pre-Anesthesia Assessment:                           - Prior to the procedure, a History and Physical                            was performed, and patient medications and                            allergies were reviewed. The patient's tolerance of                            previous anesthesia was also reviewed. The risks                            and benefits of the procedure and the sedation                            options and risks were discussed with the patient.  All questions were answered, and informed consent                            was obtained. Prior Anticoagulants: The patient has                            taken Eliquis (apixaban), last dose was 4 days                            prior to procedure. ASA Grade Assessment: III - A                            patient with severe systemic  disease. After                            reviewing the risks and benefits, the patient was                            deemed in satisfactory condition to undergo the                            procedure.                           After obtaining informed consent, the endoscope was                            passed under direct vision. Throughout the                            procedure, the patient's blood pressure, pulse, and                            oxygen saturations were monitored continuously. The                            [id number]-HQ190L ([id number]) Olympus colonoscope was                            introduced through the mouth and advanced to the                            jejunum, to the 160 cm mark (from the incisors).                            The small bowel enteroscopy was somewhat difficult                            due to significant looping. Successful completion                            of the procedure was aided by using manual  pressure. The patient tolerated the procedure well. Scope In: Scope Out: Findings:      Patchy, white plaques were found in the entire esophagus. Many of these       rinsed with water lavage, but some were adherent. Biopsies were taken       with a cold forceps for histology. Estimated blood loss was minimal.      The exam of the esophagus was otherwise normal.      Multiple dispersed erosions with no bleeding and no stigmata of recent       bleeding were found in the gastric body (lesser and greater curvature).       Some areas had a 'pock-marked' appearance Biopsies were taken with a       cold forceps for Helicobacter pylori testing. Estimated blood loss was       minimal.      The exam of the stomach was otherwise normal.      There was no evidence of significant pathology in the entire examined       duodenum.      Three angioectasias with no bleeding were found in the proximal jejunum.       Coagulation for  tissue destruction using argon plasma was successful.       Estimated blood loss was minimal.      The maximum extent of insertion (jejunum at 160 cm from the incisors)       was tattooed with an injection of 4 mL of Spot (carbon black). Estimated       blood loss was minimal.      Exam of the jejunum was otherwise normal. Impression:               - Esophageal plaques were found, low suspicion for                            candidiasis. Biopsied.                           - Gastric erosions with no bleeding and no stigmata                            of recent bleeding. Biopsied.                           - Normal examined duodenum.                           - Three non-bleeding angioectasias in the jejunum.                            Treated with argon plasma coagulation (APC).                           - Areas from the jejunum at 160 cm from the                            incisors were tattooed.                           - It is possible there are more AVMs  deeper in the                            small intestine than what was visible today. Recommendation:           - Return patient to hospital ward for ongoing care.                           - Ok for regular diet.                           - Await pathology results.                           - Resume Eliquis (apixaban) at prior dose in 2 days.                           - Repeat CBC in am. If stable, probably okay for                            discharge tomorrow.                           - Avoid NSAIDs Procedure Code(s):        --- Professional ---                           [phone], Small intestinal endoscopy, enteroscopy                            beyond second portion of duodenum, not including                            ileum; with ablation of tumor(s), polyp(s), or                            other lesion(s) not amenable to removal by hot                            biopsy forceps, bipolar cautery or snare technique                            [id number], 51, Small intestinal endoscopy, enteroscopy                            beyond second portion of duodenum, not including                            ileum; with biopsy, single or multiple Diagnosis Code(s):        --- Professional ---                           K22.9, Disease of esophagus, unspecified                           K25.9, Gastric ulcer,  unspecified as acute or                            chronic, without hemorrhage or perforation                           K55.20, Angiodysplasia of colon without hemorrhage                           K31.819, Angiodysplasia of stomach and duodenum                            without bleeding CPT copyright 2022 American [employer] Association. All rights reserved. The codes documented in this report are preliminary and upon coder review may  be revised to meet current compliance requirements. Danel Requena E. Tomasa Rand, MD 05/06/2023 12:30:11 PM This report has been signed electronically. Number of Addenda: 0

## 2023-05-06 NOTE — Progress Notes (Signed)
  Progress Note   Patient: Marvin Salinas ZOX:[id number] DOB: February 17, 1946 DOA: 05/04/2023     2 DOS: the patient was seen and examined on 05/06/2023 at 9:32AM      Brief hospital course: Mr. Stake is a 77 y.o. M with hx Aflutter and recent TIA on Tikosyn/Eliquis, MM, DM and CKD IIIa baseline 1.5 who was admitted from Oncology clinic for suspected GIB, Hgb 6.1 g/dL.     Assessment and Plan: * Gastrointestinal hemorrhage with melena Slow timing of onset, lack of over bleeding implies slow oozing source (AVM, maybe gastritis)  EGD/Colon 11/21 showed gastritis, several <1cm polyps.  Capsule study in 2022 showed 3 AVMs.  EGD showed small ulcers in stomach, nonbleeding, also few AVMs in jejunum, treated with APC.   - Hold Eliquis for 2 more days - No plan for SCE - Continue PPI and sucralfate - Trend CBC tomorrow and home if normal     Likely iron deficiency anemia due to chronic blood loss Transfused PRBCs on admission, Hgb 5.7 > 8.2 No further bleeding Hgb stable Iron replete - Folate per Dr. Myna Hidalgo    Hypertension BP normal - Continue metoprolol - Hold olmesartan  Paroxysmal atrial flutter (HCC) - Hold Eliquis two more days - Continue dofetilide, metoprolol - Supp K to keep >4    Controlled diabetes mellitus type 2 with complications (HCC) Hemoglobin A1c 7% last month.  Glucoses okay - Continue insulin  Chronic kidney disease (CKD), stage IIIa (moderate) (HCC) Creatinine stable relative to baseline 1.3-1.5  Hyperlipidemia - Continue Crestor - Hold Zetia  Carotid artery disease (HCC) - Hold aspirin and Eliquis - Hold Zetia - Continue Crestor metoprolol          Subjective: Feeling well, no clinical bleeidng, would like to go home.  EGD today.     Physical Exam: BP 124/77 (BP Location: Right Arm)   Pulse (!) 54   Temp (!) 97.1 F (36.2 C)   Resp 18   Ht 5\' 8"  (1.727 m)   Wt 70.2 kg   SpO2 100%   BMI 23.53 kg/m   Adult male, sitting up  in bed, interactive and appropriate, no acute distress RRR, no murmurs, no peripheral edema Respiratory normal, lungs clear without rales or wheezes Abdomen soft without tenderness palpation or guarding, no ascites or distention Attention normal, affect appropriate, judgment and insight appear normal, face symmetric, speech fluent  Data Reviewed: CBC shows hemoglobin up to 9.6 Basic metabolic panel shows creatinine stable at 1.3 Iron studies normal  Family Communication:     Disposition: Status is: Inpatient         Author: Alberteen Sam, MD 05/06/2023 5:36 PM  For on call review [email or web address].

## 2023-05-06 NOTE — Progress Notes (Signed)
Marvin Salinas was taken out of the ICU yesterday.  His nasal bone 3 E.  He will hopefully have his small bowel enteroscopy today.  He is going to the bathroom.  There is no obvious melena.  His labs today show sodium 137.  Potassium 3.9.  BUN 14 creatinine 1.3.  Calcium 9.1 with an albumin of 3.8.  His white cell count is 4.6.  Hemoglobin 9.6.  Platelet count 230,000.  His iron studies as she wanted to be.  His iron saturation with 25%.  I think that probably would benefit from some folic acid.  I am sending off an erythropoietin level on him.  He does have some mild renal insufficiency.  He really would like to go home.  Maybe, he can go tomorrow after he has the small bowel enteroscopy today.  He is having no chest pain.  He is having no fever.  There is no nausea or vomiting.  He has had no cough.  His vital signs are temperature of 98.5.  Pulse 57.  Blood pressure 125/64.  Head and neck exam shows no ocular or oral lesions.  There are no palpable cervical or supraclavicular lymph nodes.  Lungs are clear.  Cardiac exam regular rate and rhythm.  There are no murmurs.  Abdomen is soft.  Bowel sounds are present.  There is no fluid wave.  There is no palpable abdominal mass.  There is no palpable hepatosplenomegaly.  Extremities shows no clubbing, cyanosis or edema.  Neurological exam shows no focal neurological deficits.   Marvin Salinas has a light chain myeloma.  He has been admitted to because of GI bleeding.  He has had GI bleeding in the past from AVMs.  He will have the endoscopy and enteroscopy today.  Hopefully, he will be able to go home soon.  He does look a lot better than when he was admitted.  He obviously has gotten great care from everybody in the hospital.   Christin Bach, MD  Philippians 4:19

## 2023-05-07 DIAGNOSIS — K921 Melena: Secondary | ICD-10-CM | POA: Diagnosis not present

## 2023-05-07 LAB — COMPREHENSIVE METABOLIC PANEL
ALT: 20 U/L (ref 0–44)
AST: 18 U/L (ref 15–41)
Albumin: 3.4 g/dL — ABNORMAL LOW (ref 3.5–5.0)
Alkaline Phosphatase: 38 U/L (ref 38–126)
Anion gap: 6 (ref 5–15)
BUN: 18 mg/dL (ref 8–23)
CO2: 22 mmol/L (ref 22–32)
Calcium: 8.5 mg/dL — ABNORMAL LOW (ref 8.9–10.3)
Chloride: 106 mmol/L (ref 98–111)
Creatinine, Ser: 1.42 mg/dL — ABNORMAL HIGH (ref 0.61–1.24)
GFR, Estimated: 51 mL/min — ABNORMAL LOW (ref >60)
Glucose, Bld: 120 mg/dL — ABNORMAL HIGH (ref 70–99)
Potassium: 4.3 mmol/L (ref 3.5–5.1)
Sodium: 134 mmol/L — ABNORMAL LOW (ref 135–145)
Total Bilirubin: 0.8 mg/dL (ref <1.2)
Total Protein: 5.2 g/dL — ABNORMAL LOW (ref 6.5–8.1)

## 2023-05-07 LAB — CBC WITH DIFFERENTIAL/PLATELET
Abs Immature Granulocytes: 0.02 10*3/uL (ref 0.00–0.07)
Basophils Absolute: 0 10*3/uL (ref 0.0–0.1)
Basophils Relative: 0 %
Eosinophils Absolute: 0 10*3/uL (ref 0.0–0.5)
Eosinophils Relative: 0 %
HCT: 28.4 % — ABNORMAL LOW (ref 39.0–52.0)
Hemoglobin: 8.5 g/dL — ABNORMAL LOW (ref 13.0–17.0)
Immature Granulocytes: 0 %
Lymphocytes Relative: 15 %
Lymphs Abs: 1 10*3/uL (ref 0.7–4.0)
MCH: 26.4 pg (ref 26.0–34.0)
MCHC: 29.9 g/dL — ABNORMAL LOW (ref 30.0–36.0)
MCV: 88.2 fL (ref 80.0–100.0)
Monocytes Absolute: 0.7 10*3/uL (ref 0.1–1.0)
Monocytes Relative: 10 %
Neutro Abs: 5.2 10*3/uL (ref 1.7–7.7)
Neutrophils Relative %: 75 %
Platelets: 200 10*3/uL (ref 150–400)
RBC: 3.22 MIL/uL — ABNORMAL LOW (ref 4.22–5.81)
RDW: 14.8 % (ref 11.5–15.5)
WBC: 7 10*3/uL (ref 4.0–10.5)
nRBC: 0 % (ref 0.0–0.2)

## 2023-05-07 LAB — ERYTHROPOIETIN: Erythropoietin: 37.9 m[IU]/mL — ABNORMAL HIGH (ref 2.6–18.5)

## 2023-05-07 LAB — GLUCOSE, CAPILLARY: Glucose-Capillary: 144 mg/dL — ABNORMAL HIGH (ref 70–99)

## 2023-05-07 MED ORDER — FOLIC ACID 1 MG PO TABS: 2.0000 mg | ORAL_TABLET | Freq: Every day | ORAL | Status: DC

## 2023-05-07 MED ORDER — PANTOPRAZOLE SODIUM 40 MG PO TBEC: 40.0000 mg | DELAYED_RELEASE_TABLET | Freq: Every day | ORAL | 0 refills | Status: DC

## 2023-05-07 NOTE — Progress Notes (Signed)
AVS reviewed w/ pt who verbalized an understanding. No other questions at this time .Pt's car is at [facility]- pt is unable to locate a ride back to [facility]- family is unavailable - pt already checked- taxi cab voucher obtained from [facility]. PIV removed as noted. Pt dressing for d/c to home

## 2023-05-07 NOTE — TOC Progression Note (Signed)
Transition of Care [facility]) - Progression Note    Patient Details  Name: Marvin Salinas MRN: [id number] Date of Birth: 07/14/1945  [facility] CM/SW Contact  Georgie Chard, LCSW Phone Number: 05/07/2023, 10:20 AM  Clinical Narrative:    CSW was contacted by patient's nurse about transportation. The patient's Car is at [facility]. This CSW did reach out to the patient's sister to inquire about getting the patient. The sister stated that she can not get the patient. This CSW has approved a taxi voucher for the patient and gave it to the patient's nurse. At this time there are no further TOC needs.         Expected Discharge Plan and Services         Expected Discharge Date: 05/07/23                                     Social Determinants of Health (SDOH) Interventions SDOH Screenings   Food Insecurity: No Food Insecurity (05/04/2023)  Housing: Low Risk  (05/04/2023)  Transportation Needs: No Transportation Needs (05/04/2023)  Utilities: Not At Risk (05/04/2023)  Alcohol Screen: Low Risk  (03/19/2022)  Depression (PHQ2-9): Low Risk  (03/28/2023)  Financial Resource Strain: Low Risk  (03/21/2023)  Physical Activity: Unknown (03/21/2023)  Social Connections: Socially Isolated (03/21/2023)  Stress: No Stress Concern Present (03/21/2023)  Tobacco Use: Medium Risk (05/06/2023)  Health Literacy: Adequate Health Literacy (03/21/2023)    Readmission Risk Interventions    05/06/2023   10:18 AM  Readmission Risk Prevention Plan  Transportation Screening Complete  PCP or Specialist Appt within 5-7 Days Complete  Home Care Screening Complete  Medication Review (RN CM) Complete

## 2023-05-07 NOTE — Discharge Summary (Signed)
Physician Discharge Summary   Patient: Marvin Salinas MRN: [id number] DOB: 1946/02/08  Admit date:     05/04/2023  Discharge date: 05/07/23  Discharge Physician: Alberteen Sam   PCP: Natalia Leatherwood, DO     Recommendations at discharge:  Follow up with [facility] for bleeding AVMs and esophageal biopsy Follow up with PCP Dr. Claiborne Billings in 1 week Dr. Claiborne Billings:  Please ensure patient is back on Eliquis Please check CBC in 1 week  Dr. Kirke Corin: Please advise patient if he should resume low dose aspirin or not     Discharge Diagnoses: Principal Problem:   Gastrointestinal hemorrhage with melena Active Problems:   Likely iron deficiency anemia due to chronic blood loss   Peripheral vascular disease (HCC)   Hyperlipidemia   Chronic kidney disease (CKD), stage IIIa (moderate) (HCC)   Controlled diabetes mellitus type 2 with complications (HCC)   Diabetic neuropathy (HCC)   Paroxysmal atrial flutter (HCC)   Hypertension   Lambda light chain myeloma (HCC)   AVM (arteriovenous malformation) of small bowel, acquired with hemorrhage    Hospital Course: [name] is a 77 y.o. M with hx Aflutter and recent TIA on Tikosyn/Eliquis, MM, DM and CKD IIIa baseline 1.5 who was admitted from Oncology clinic for suspected GIB, Hgb 6.1 g/dL.      * Gastrointestinal hemorrhage with melena Patient with slow onset anemia, unclear if he had melena or not.  Admitted, Eliquis held, treated with IV PPI.  Underwent EGD that showed white plaques in the esophagus, minimal gastritis, a few jejunal AVMs which were treated with argon plasma coagulation.  GI recommended holding Eliquis 48 hours, continuing current meds, and outpatient follow-up. - Check CBC in 1 week - Follow-up with GI - Resume daily PPI   Likely iron deficiency anemia due to chronic blood loss Transfused 2 units on admission, hemoglobin went from 5.7-8.2.  After transfusion his hemoglobin stayed stable in the 8-9  range.  He had no further clinical bleeding observed.  Iron stores normal.  Oncology started folate. - Check CBC in 1 week  Peripheral vascular disease Aspirin held at discharge.  He has had no stents in over a year.  He should resume Eliquis tomorrow and follow-up with his cardiologist, Dr. Kirke Corin for advice regarding resumption of aspirin             The [facility] Controlled Substances Registry was reviewed for this patient prior to discharge.  Consultants: Gastroenterology Procedures performed:  Endoscopy with enteroscopy  Disposition: Home Diet recommendation:  Cardiac and Carb modified diet  DISCHARGE MEDICATION: Allergies as of 05/07/2023       Reactions   Codeine Nausea And Vomiting   Lisinopril Cough   Nsaids Other (See Comments)   CKD   Cymbalta [duloxetine Hcl] Other (See Comments)   Halucinations   Dapagliflozin Rash   Latex Hives        Medication List     STOP taking these medications    apixaban 5 MG Tabs tablet Commonly known as: Eliquis   Aspirin Low Dose 81 MG chewable tablet Generic drug: aspirin       TAKE these medications    acetaminophen 500 MG tablet Commonly known as: TYLENOL Take 1,000 mg by mouth every 6 (six) hours as needed for mild pain (pain score 1-3) or moderate pain (pain score 4-6).   BD Pen Needle Nano 2nd Gen 32G X 4 MM Misc Generic drug: Insulin Pen Needle Place 1 Needle onto the  skin in the morning and at bedtime.   dofetilide 125 MCG capsule Commonly known as: TIKOSYN Take 1 capsule (125 mcg total) by mouth 2 (two) times daily.   ezetimibe 10 MG tablet Commonly known as: ZETIA Take 1 tablet (10 mg total) by mouth daily.   famciclovir 250 MG tablet Commonly known as: FAMVIR Take 1 tablet (250 mg total) by mouth daily.   folic acid 1 MG tablet Commonly known as: FOLVITE Take 2 tablets (2 mg total) by mouth daily. Start taking on: May 08, 2023   gabapentin 300 MG capsule Commonly known  as: NEURONTIN Take 1 capsule (300 mg total) by mouth 3 (three) times daily.   GENTEAL OP Place 1 drop into both eyes daily as needed (dry eyes).   insulin glargine 100 UNIT/ML Solostar Pen Commonly known as: LANTUS Inject 30-40 Units into the skin 2 (two) times daily.   loratadine 10 MG tablet Commonly known as: CLARITIN Take 10 mg by mouth daily as needed for allergies.   metoprolol tartrate 25 MG tablet Commonly known as: LOPRESSOR Take 0.5 tablets (12.5 mg total) by mouth 2 (two) times daily.   nitroGLYCERIN 0.4 MG SL tablet Commonly known as: NITROSTAT Place 0.4 mg under the tongue every 5 (five) minutes as needed for chest pain.   olmesartan 5 MG tablet Commonly known as: BENICAR Take 5 mg by mouth daily.   ondansetron 8 MG tablet Commonly known as: ZOFRAN Take 1 tablet (8 mg total) by mouth every 8 (eight) hours as needed for nausea or vomiting.   OneTouch Delica Plus Lancet33G Misc USE UP TO FOUR TIMES DAILY AS DIRECTED   OneTouch Verio test strip Generic drug: glucose blood USE UP TO FOUR TIMES DAILY AS DIRECTED.   pantoprazole 40 MG tablet Commonly known as: PROTONIX Take 1 tablet (40 mg total) by mouth daily.   polyethylene glycol powder 17 GM/SCOOP powder Commonly known as: GLYCOLAX/MIRALAX Take 1 Container by mouth daily.   rosuvastatin 20 MG tablet Commonly known as: CRESTOR Take 1 tablet (20 mg total) by mouth at bedtime.   traZODone 50 MG tablet Commonly known as: DESYREL Take 0.5-1 tablets (25-50 mg total) by mouth at bedtime as needed for sleep.        Follow-up Information     Jenel Lucks, MD Follow up.   Specialty: Gastroenterology Contact information: [address] [place] [address] [phone]         Felix Pacini A, DO Follow up.   Specialty: Family Medicine Contact information: [address] [place] [address] [phone]                 Discharge Instructions     Discharge instructions    Complete by: As directed    **IMPORTANT DISCHARGE INSTRUCTIONS**   From Dr. Maryfrances Bunnell: You were admitted for anemia  Here, you had an endoscopy that showed some "arteriovenous malformations" or "AVMs" in your small intestine  These were treated by Dr. Tomasa Rand with argon plasma coagulation  DO NOT TAKE your Eliquis for the next 24 hours  You may resume at your normal home dose tomorrow  For now, STOP aspirin  Call Dr. Jari Sportsman office and ask them if you should resume in the future, or if this is not necessary  Continue your other home medicines  Resume pantoprazole 40 mg daily (antacid medicine)   Call Dr. Birdena Crandall office to go see the gastroenterology specialists for biopsy follow up (see below in the To Do  section, Dr. Tomasa Rand is Dr. Birdena Crandall partner whom you saw)  IMPROTANT:  Restart eliquis tomorrow  Go see Dr. Claiborne Billings for labs in 1 week   Increase activity slowly   Complete by: As directed        Discharge Exam: Filed Weights   05/04/23 0959 05/04/23 1801  Weight: 75.3 kg 70.2 kg    General: Pt is alert, awake, not in acute distress Cardiovascular: RRR, nl S1-S2, no murmurs appreciated.   No LE edema.   Respiratory: Normal respiratory rate and rhythm.  CTAB without rales or wheezes. Abdominal: Abdomen soft and non-tender.  No distension or HSM.   Neuro/Psych: Strength symmetric in upper and lower extremities.  Judgment and insight appear normal.   Condition at discharge: good  The results of significant diagnostics from this hospitalization (including imaging, microbiology, ancillary and laboratory) are listed below for reference.   Imaging Studies: No results found.  Microbiology: Results for orders placed or performed during the hospital encounter of 05/04/23  MRSA Next Gen by PCR, Nasal     Status: None   Collection Time: 05/04/23  4:46 PM   Specimen: Nasal Mucosa; Nasal Swab  Result Value Ref Range Status   MRSA by PCR Next Gen NOT DETECTED NOT  DETECTED Final    Comment: (NOTE) The GeneXpert MRSA Assay (FDA approved for NASAL specimens only), is one component of a comprehensive MRSA colonization surveillance program. It is not intended to diagnose MRSA infection nor to guide or monitor treatment for MRSA infections. Test performance is not FDA approved in patients less than 61 years old. Performed at [facility], [address] [place], [place] [address]     Labs: CBC: Recent Labs  Lab 05/04/23 [phone] 05/04/23 1018 05/04/23 1348 05/05/23 0617 05/06/23 0426 05/07/23 0528  WBC 3.9* 4.4  --  5.0 4.6 7.0  NEUTROABS 2.7  --   --   --  3.0 5.2  HGB 6.3* 6.1* 5.7* 8.2* 9.6* 8.5*  HCT 21.5* 20.1* 19.3* 25.6* 30.1* 28.4*  MCV 86.7 85.2  --  85.3 85.0 88.2  PLT 229 232  --  194 230 200   Basic Metabolic Panel: Recent Labs  Lab 05/04/23 0852 05/04/23 1018 05/05/23 0617 05/06/23 0426 05/07/23 0528  NA 139 138 137 137 134*  K 4.1 4.1 4.0 3.9 4.3  CL 108 107 106 105 106  CO2 25 24 24 25 22   GLUCOSE 97 104* 76 107* 120*  BUN 16 17 15 14 18   CREATININE 1.53* 1.52* 1.31* 1.34* 1.42*  CALCIUM 8.4* 8.4* 8.6* 9.1 8.5*   Liver Function Tests: Recent Labs  Lab 05/04/23 0852 05/04/23 1018 05/05/23 0617 05/06/23 0426 05/07/23 0528  AST 25 23 16 31 18   ALT 24 25 19 24 20   ALKPHOS 45 43 36* 42 38  BILITOT 0.5 0.5 1.8* 1.0 0.8  PROT 6.0* 5.7* 5.2* 5.7* 5.2*  ALBUMIN 3.8 3.7 3.3* 3.8 3.4*   CBG: Recent Labs  Lab 05/06/23 1059 05/06/23 1224 05/06/23 1637 05/06/23 2041 05/07/23 0728  GLUCAP 100* 150* 168* 192* 144*    Discharge time spent: approximately 35 minutes spent on discharge counseling, evaluation of patient on day of discharge, and coordination of discharge planning with nursing, social work, pharmacy and case management  Signed: Alberteen Sam, MD [facility] 05/07/2023

## 2023-05-07 NOTE — Progress Notes (Signed)
Mobility Specialist - Progress Note   05/07/23 1028  Mobility  Activity Ambulated with assistance in hallway  Level of Assistance Modified independent, requires aide device or extra time  Assistive Device Cane  Distance Ambulated (ft) 500 ft  Activity Response Tolerated well  Mobility Referral Yes  $Mobility charge 1 Mobility  Mobility Specialist Start Time (ACUTE ONLY) 0933  Mobility Specialist Stop Time (ACUTE ONLY) 0941  Mobility Specialist Time Calculation (min) (ACUTE ONLY) 8 min   Pt received in bed and agreeable to mobility. No complaints during session. Pt to bed after session with all needs met.     [facility]

## 2023-05-09 ENCOUNTER — Encounter (HOSPITAL_COMMUNITY): Payer: Self-pay | Admitting: Gastroenterology

## 2023-05-09 ENCOUNTER — Telehealth: Payer: Self-pay

## 2023-05-09 LAB — UPEP/UIFE/LIGHT CHAINS/TP, 24-HR UR
% BETA, Urine: 27.9 %
ALPHA 1 URINE: 5.2 %
Albumin, U: 46.7 %
Alpha 2, Urine: 11.1 %
Free Kappa Lt Chains,Ur: 79.47 mg/L (ref 1.17–86.46)
Free Kappa/Lambda Ratio: 4.56 (ref 1.83–14.26)
Free Lambda Lt Chains,Ur: 17.41 mg/L — ABNORMAL HIGH (ref 0.27–15.21)
GAMMA GLOBULIN URINE: 9.2 %
M-SPIKE %, Urine: 2.1 % — ABNORMAL HIGH
M-Spike, Mg/24 Hr: 4 mg/(24.h) — ABNORMAL HIGH
Total Protein, Urine-Ur/day: 211 mg/(24.h) — ABNORMAL HIGH (ref 30–150)
Total Protein, Urine: 28.1 mg/dL
Total Volume: 750

## 2023-05-09 LAB — SURGICAL PATHOLOGY

## 2023-05-09 NOTE — Transitions of Care (Post Inpatient/ED Visit) (Signed)
05/09/2023  Name: Marvin Salinas MRN: [id number] DOB: December 14, 1945  Today's TOC FU Call Status: Today's TOC FU Call Status:: Successful TOC FU Call Completed TOC FU Call Complete Date: 05/09/23 Patient's Name and Date of Birth confirmed.  Transition Care Management Follow-up Telephone Call Date of Discharge: 05/07/23 Discharge Facility: [facility]) Type of Discharge: Inpatient Admission How have you been since you were released from the hospital?: Better (feelinmg stronger) Any questions or concerns?: No  Items Reviewed: Did you receive and understand the discharge instructions provided?: Yes Medications obtained,verified, and reconciled?: Yes (Medications Reviewed) Any new allergies since your discharge?: No Dietary orders reviewed?: Yes Type of Diet Ordered:: heart healthy, carb modified Do you have support at home?: Yes Name of Support/Comfort Primary Source: lives alone. reports family and friends will help him  Medications Reviewed Today: Medications Reviewed Today     Reviewed by Earlie Server, RN (Registered Nurse) on 05/09/23 at 1016  Med List Status: <None>   Medication Order Taking? Sig Documenting Provider Last Dose Status Informant  acetaminophen (TYLENOL) 500 MG tablet [id number] Yes Take 1,000 mg by mouth every 6 (six) hours as needed for mild pain (pain score 1-3) or moderate pain (pain score 4-6). [provider] Taking Active Self, Pharmacy Records  BD PEN NEEDLE NANO 2ND GEN 32G X 4 MM MISC [id number] Yes Place 1 Needle onto the skin in the morning and at bedtime. [provider] Taking Active Self, Pharmacy Records  Carboxymethylcell-Hypromellose [facility] OP) [id number] Yes Place 1 drop into both eyes daily as needed (dry eyes). [provider] Taking Active Self, Pharmacy Records  dofetilide [facility]) 125 MCG capsule [id number] Yes Take 1 capsule (125 mcg total) by mouth 2 (two) times daily. [name], Deloris Ping, MD Taking Active Self,  Pharmacy Records  ezetimibe (ZETIA) 10 MG tablet [id number] Yes Take 1 tablet (10 mg total) by mouth daily. Natalia Leatherwood, DO Taking Active Self, Pharmacy Records  famciclovir [facility]) 250 MG tablet [id number] Yes Take 1 tablet (250 mg total) by mouth daily. Josph Macho, MD Taking Active Self, Pharmacy Records  folic acid (FOLVITE) 1 MG tablet [id number] Yes Take 2 tablets (2 mg total) by mouth daily. Danford, Earl Lites, MD Taking Active   gabapentin (NEURONTIN) 300 MG capsule [id number] Yes Take 1 capsule (300 mg total) by mouth 3 (three) times daily. Josph Macho, MD Taking Active Self, Pharmacy Records  glucose blood [facility] VERIO) test strip [id number] Yes USE UP TO FOUR TIMES DAILY AS DIRECTED. Felix Pacini A, DO Taking Active Self, Pharmacy Records  insulin glargine (LANTUS) 100 UNIT/ML Solostar Pen [id number] Yes Inject 30-40 Units into the skin 2 (two) times daily. Natalia Leatherwood, DO Taking Active Self, Pharmacy Records  Lancets [facility] DELICA PLUS [place]) MISC [id number] Yes USE UP TO FOUR TIMES DAILY AS DIRECTED Kuneff, Renee A, DO Taking Active Self, Pharmacy Records  loratadine (CLARITIN) 10 MG tablet [id number] Yes Take 10 mg by mouth daily as needed for allergies. [provider] Taking Active Self, Pharmacy Records  metoprolol tartrate (LOPRESSOR) 25 MG tablet [id number] Yes Take 0.5 tablets (12.5 mg total) by mouth 2 (two) times daily. [name], Deloris Ping, MD Taking Active Self, Pharmacy Records  nitroGLYCERIN (NITROSTAT) 0.4 MG SL tablet [id number] Yes Place 0.4 mg under the tongue every 5 (five) minutes as needed for chest pain. [provider] Taking Active Self, Pharmacy Records  olmesartan (BENICAR) 5 MG tablet [id number] Yes Take 5 mg by mouth daily. [provider]  Taking Active Self, Pharmacy Records  ondansetron (ZOFRAN) 8 MG tablet [id number] Yes Take 1 tablet (8 mg total) by mouth every 8 (eight) hours as needed for nausea or vomiting.  Josph Macho, MD Taking Active Self, Pharmacy Records  pantoprazole (PROTONIX) 40 MG tablet [id number] Yes Take 1 tablet (40 mg total) by mouth daily. Alberteen Sam, MD Taking Active   polyethylene glycol powder (GLYCOLAX/MIRALAX) 17 GM/SCOOP powder [id number] Yes Take 1 Container by mouth daily. [provider] Taking Active Self, Pharmacy Records    Discontinued 02/17/22 0845   rosuvastatin (CRESTOR) 20 MG tablet [id number] Yes Take 1 tablet (20 mg total) by mouth at bedtime. Natalia Leatherwood, DO Taking Active Self, Pharmacy Records  traZODone (DESYREL) 50 MG tablet [id number] Yes Take 0.5-1 tablets (25-50 mg total) by mouth at bedtime as needed for sleep. Natalia Leatherwood, DO Taking Active Self, Pharmacy Records  Med List Note [place]is [place], [facility] 11/02/21 1206): Revlimid filled through [facility]            Home Care and Equipment/Supplies: Were Home Health Services Ordered?: NA Any new equipment or medical supplies ordered?: NA  Functional Questionnaire: Do you need assistance with bathing/showering or dressing?: No Do you need assistance with meal preparation?: No Do you need assistance with eating?: No Do you have difficulty maintaining continence: No Do you need assistance with getting out of bed/getting out of a chair/moving?: No Do you have difficulty managing or taking your medications?: No  Follow up appointments reviewed: PCP Follow-up appointment confirmed?: No MD Provider Line Number:[phone] Given: No Specialist Hospital Follow-up appointment confirmed?: No Reason Specialist Follow-Up Not Confirmed: Patient has Specialist Provider Number and will Call for Appointment Do you need transportation to your follow-up appointment?: No Do you understand care options if your condition(s) worsen?: Yes-patient verbalized understanding  SDOH Interventions Today    Flowsheet Row Most Recent Value  SDOH Interventions   Food Insecurity  Interventions Intervention Not Indicated  Housing Interventions Intervention Not Indicated  Transportation Interventions Intervention Not Indicated  Utilities Interventions Intervention Not Indicated     Patient reports that he is doing very well. Reports he is feeling the best that he has in a long time. Reports no BM for 2.5 days. He is taking the Miralax.  Reports he is self managing well. Reviewed medications and need for follow up appointments. Patient reports that he will call and make his own appointments.  Reviewed and offer 30 day TOC program and patient has agreed.   Goals Addressed               This Visit's Progress     Patient will report no readmissions to the hospital in the next 30 days. (pt-stated)        Current Barriers:  Provider appointments No hospital follow up appointments as of yet for PCP and specialist Recent GI bleed with constipation  RNCM Clinical Goal(s):  Patient will work with the Care Management team over the next 30 days to address Transition of Care Barriers: Provider appointments Patient will understand the importance of not being constipated and observing for bleeding take all medications exactly as prescribed and will call provider for medication related questions as evidenced by patient report attend all scheduled medical appointments: PCP and specialist as evidenced by review of EMR and patient report  through collaboration with RN Care manager, provider, and care team.   Interventions: Evaluation of current treatment plan related to  self management and patient's  adherence to plan as established by provider   Anemia/Bleeding Interventions:  (Status:  New goal.) Short Term Goal  Assessment of understanding of anemia/bleeding disorder diagnosis  Basic overview and discussion of anemia/bleeding disorder or acute disease state  Medications reviewed  Counseled on bleeding risk associated with Eliquis and importance of self-monitoring for  signs/symptoms of bleeding Counseled on importance of regular laboratory monitoring as directed by provider Provided education about signs and symptoms of active bleeding such as stomach discomfort, coughing up blood or blood tinged secretions, bleeding from the gums/teeth, nosebleeds, increased bruising, blood in the urine/stool and/or if a traumatic injury occurs, regardless of severity of injury  Advised to call provider or 911 if active bleeding or signs and symptoms of active bleeding occur recommended promotion of rest and energy-conserving measures to manage fatigue, such as balancing activity with periods of rest encouraged strategies to prevent falls related to fatigue, weakness and dizziness; encouraged sitting before standing and using an assistive device encouraged optimal oral intake to support fluid balance and nutrition Assessed social determinant of health barriers  Encouraged patient to call MD if he continues to be constipated. Offered 30 day TOC program and patient agreed. Provided my contact information and encouraged patient to call me if needed. Lab Results  Component Value Date   WBC 7.0 05/07/2023   HGB 8.5 (L) 05/07/2023   HCT 28.4 (L) 05/07/2023   MCV 88.2 05/07/2023   PLT 200 05/07/2023    Patient Goals/Self-Care Activities: Participate in Transition of Care Program/Attend [facility] scheduled calls Notify RN Care Manager of TOC call rescheduling needs Take all medications as prescribed Attend all scheduled provider appointments Call provider office for new concerns or questions   Follow Up Plan:  Telephone follow up appointment with care management team member scheduled for:  05/16/2023         Lonia Chimera, RN, BSN, CEN Population Health- Transition of Care Team.  [facility] [phone]

## 2023-05-10 ENCOUNTER — Inpatient Hospital Stay: Payer: 59 | Admitting: Dietician

## 2023-05-10 NOTE — Progress Notes (Signed)
Nutrition Assessment Called patient at his home telephone#.  Reason for Assessment: MST screen for weight loss.    ASSESSMENT: Patient is a 77 y.o. male with light chain myeloma. He was recently admitted for a GI bleed.   He is followed by Dr. Myna Hidalgo and has been off therapy now for about 3 months with no evidence of his myeloma being active.  His PMHx includes DM2, CKD, OSA, HTN, CAD, PAD.   Patient reports his appetite is picking up.  Eats small meals though out day.  Doesn't like any greens other than cabbage. Usual foods oatmeal and sausage, banana Homemade soups (chicken tomatoes, corn, black eye peas).   Likes fruit (oranges, apples), graham peanut crackers Fluid: water, diet Mt. Dew, Glucerna QD.     Anthropometrics:  Weight loss 10.3# (6.24%) past month  Height: 68" Weight:  He reports down to 146# at home today 05/04/23  154.7# UBW: 160-165# BMI: 23.53    NUTRITION DIAGNOSIS: Inadequate PO intake to meet increased nutrient needs, r/t cancer diagnosis  INTERVENTION:  Relayed that nutrition services are wrap around service provided at no charge and encouraged continued communication if experiencing continued weight loss or any nutritional impact symptoms (NIS). Educated on importance of adequate calorie and protein energy intake  with nutrient dense foods when possible to maintain weight/strength Discussed ways to add calories/protein to foods (adding cheese, cooking with butter, creamy sauces/gravy).  Encouraged small frequent feeds and trying to eat 6 small meals Suggested increase oral nutrition supplement to another half bottle per day in evening Discussed strategies for anemia and replenishing blood counts Emailed Nutrition Tip sheet  for Anemia and High Protein High Calorie Snacking with Contact information provided   MONITORING, EVALUATION, GOAL: weight, PO intake, Nutrition Impact Symptoms, labs Goal is weight gain 2#/month  Next Visit: remote next month  after oncologist appointment  Gennaro Africa, RDN, LDN Registered Dietitian, [facility] Part Time Remote (Usual office hours: Tuesday-Thursday) Cell: [phone]

## 2023-05-11 ENCOUNTER — Encounter: Payer: Self-pay | Admitting: Family Medicine

## 2023-05-11 ENCOUNTER — Other Ambulatory Visit: Payer: Self-pay

## 2023-05-11 ENCOUNTER — Ambulatory Visit: Payer: 59 | Admitting: Family Medicine

## 2023-05-11 VITALS — BP 130/60 | HR 55 | Temp 98.1°F | Wt 163.8 lb

## 2023-05-11 DIAGNOSIS — A048 Other specified bacterial intestinal infections: Secondary | ICD-10-CM

## 2023-05-11 DIAGNOSIS — K922 Gastrointestinal hemorrhage, unspecified: Secondary | ICD-10-CM | POA: Diagnosis not present

## 2023-05-11 DIAGNOSIS — D649 Anemia, unspecified: Secondary | ICD-10-CM | POA: Diagnosis not present

## 2023-05-11 DIAGNOSIS — I4819 Other persistent atrial fibrillation: Secondary | ICD-10-CM

## 2023-05-11 MED ORDER — APIXABAN 5 MG PO TABS: 5.0000 mg | ORAL_TABLET | Freq: Two times a day (BID) | ORAL | 0 refills | Status: DC

## 2023-05-11 MED ORDER — FOLIC ACID 1 MG PO TABS: 2.0000 mg | ORAL_TABLET | Freq: Two times a day (BID) | ORAL | 5 refills | Status: DC

## 2023-05-11 MED ORDER — PANTOPRAZOLE SODIUM 40 MG PO TBEC: 40.0000 mg | DELAYED_RELEASE_TABLET | Freq: Two times a day (BID) | ORAL | 0 refills | Status: DC

## 2023-05-11 MED ORDER — PANTOPRAZOLE SODIUM 40 MG PO TBEC: 40.0000 mg | DELAYED_RELEASE_TABLET | Freq: Every day | ORAL | 1 refills | Status: DC

## 2023-05-11 MED ORDER — BISMUTH 262 MG PO CHEW: 524.0000 mg | CHEWABLE_TABLET | Freq: Four times a day (QID) | ORAL | 0 refills | Status: DC

## 2023-05-11 MED ORDER — METRONIDAZOLE 250 MG PO TABS: 250.0000 mg | ORAL_TABLET | Freq: Four times a day (QID) | ORAL | 0 refills | Status: DC

## 2023-05-11 MED ORDER — DOXYCYCLINE HYCLATE 100 MG PO CAPS: 100.0000 mg | ORAL_CAPSULE | Freq: Two times a day (BID) | ORAL | 0 refills | Status: DC

## 2023-05-11 NOTE — Progress Notes (Signed)
[name],  The biopsies from your recent upper GI Endoscopy were notable for H. Pylori gastritis.  This is a common bacteria that lives in the stomach and can cause iron deficiency anemia, as well as increase in the risk for stomach ulcers and stomach cancer.  We need to eradicate this bacteria to reduce your risk of these complications.  Will plan on treating with quad therapy as below. Please confirm no medication allergies to the prescribed regimen.   1) Pantoprazole 40 mg 2 times a day x 14 d (currently prescription is for once daily) 2) Pepto Bismol 2 tabs (262 mg each) 4 times a day x 14 d 3) Metronidazole 250 mg 4 times a day x 14 d 4) doxycycline 100 mg 2 times a day x 14 d  After 14 days, ok to stop pantoprazole  4 weeks after treatment completed, check H. Pylori stool antigen to confirm eradication (must be off acid suppression therapy for 2 weeks prior to specimen submission)  The biopsies from your esophagus were normal.  There was no evidence of fungal infection.  Additionally, I would like to recheck your blood counts this week to make sure that you are not still bleeding from any of the vascular lesions in your small intestine.  Marvin Salinas, Can you please order the H. pylori regimen above, stool antigen test for 6 weeks from now, and a CBC for tomorrow or Friday?

## 2023-05-11 NOTE — Patient Instructions (Addendum)
 Return if symptoms worsen or fail to improve.        Great to see you today.  I have refilled the medication(s) we provide.   If labs were collected or images ordered, we will inform you of  results once we have received them and reviewed. We will contact you either by echart message, or telephone call.  Please give ample time to the testing facility, and our office to run,  receive and review results. Please do not call inquiring of results, even if you can see them in your chart. We will contact you as soon as we are able. If it has been over 1 week since the test was completed, and you have not yet heard from [place], then please call us.    - echart message- for normal results that have been seen by the patient already.   - telephone call: abnormal results or if patient has not viewed results in their echart.  If a referral to a specialist was entered for you, please call us in 2 weeks if you have not heard from the specialist office to schedule.

## 2023-05-11 NOTE — Progress Notes (Signed)
Marvin Salinas , 10-Feb-1946, 77 y.o., male MRN: [id number] Patient Care Team    Relationship Specialty Notifications Start End  Natalia Leatherwood, DO PCP - General Family Medicine  01/24/19   Coralyn Helling, MD (Inactive) Consulting Physician Pulmonary Disease  01/26/19   [name], Deloris Ping, MD Consulting Physician Cardiology  04/30/20   Rachael Fee, MD Attending Physician Gastroenterology  10/23/20   Erenest Blank, NP Nurse Practitioner Nurse Practitioner  10/23/20    Comment: Hematology  Van Clines, MD Consulting Physician Neurology  09/01/21   Randa Lynn, MD Consulting Physician Nephrology  10/28/21    Comment: [facility]  [facility]    03/28/23   Iran Ouch, MD Consulting Physician Cardiology  03/28/23     Chief Complaint  Patient presents with   gastric bleed     Subjective:  Marvin Salinas  is a 77 y.o. male presents for hospital follow up after recent admission on 05/04/2023 for primary diagnosis GI bleed.  Patient was discharged on 05/07/2023 to home. Patients discharge summary has been reviewed, as well as all labs/image studies obtained during hospitalization.  Medication reconciliation completed today.  Patients hospital course: Patient was found to have a hemoglobin of 6.1 during his hematology appointment.  He was admitted to the hospital and underwent EGD which showed white plaques in the esophagus, minimal gastritis, a few jejunal AVMs which were treated with argon plasma coagulation.  He was treated with IV PPI while hospitalized.  Baby aspirin was discontinued Eliquis was held temporarily and patient was told to restart.  He received 2 units on admission and hemoglobin increased to 8.2.  Oncology had started folate, iron stores were normal. Since hospital discharge patient reports patient reports he is feeling much improved compared to prior to his hospitalization.  His energy has improved.  His appetite is normal.  Bowel habits are  normal.  He has his follow-ups with specialty team scheduled.  Recent Labs  Lab 05/05/23 0617 05/06/23 0426 05/07/23 0528  HGB 8.2* 9.6* 8.5*  HCT 25.6* 30.1* 28.4*  WBC 5.0 4.6 7.0  PLT 194 230 200      Latest Ref Rng & Units 05/07/2023    5:28 AM 05/06/2023    4:26 AM 05/05/2023    6:17 AM  CMP  Glucose 70 - 99 mg/dL 518  841  76   BUN 8 - 23 mg/dL 18  14  15    Creatinine 0.61 - 1.24 mg/dL 6.60  6.30  1.60   Sodium 135 - 145 mmol/L 134  137  137   Potassium 3.5 - 5.1 mmol/L 4.3  3.9  4.0   Chloride 98 - 111 mmol/L 106  105  106   CO2 22 - 32 mmol/L 22  25  24    Calcium 8.9 - 10.3 mg/dL 8.5  9.1  8.6   Total Protein 6.5 - 8.1 g/dL 5.2  5.7  5.2   Total Bilirubin <1.2 mg/dL 0.8  1.0  1.8   Alkaline Phos 38 - 126 U/L 38  42  36   AST 15 - 41 U/L 18  31  16    ALT 0 - 44 U/L 20  24  19        No results found.      03/28/2023    8:36 AM 03/21/2023    1:07 PM 10/11/2022    8:20 AM 03/31/2022   11:12 AM 03/19/2022  2:00 PM  Depression screen PHQ 2/9  Decreased Interest 1 0 1 0 0  Down, Depressed, Hopeless 1 0 1 0 0  PHQ - 2 Score 2 0 2 0 0  Altered sleeping 1  1    Tired, decreased energy 1  1    Change in appetite 0  0    Feeling bad or failure about yourself  0  0    Trouble concentrating 0  0    Moving slowly or fidgety/restless 0  0    Suicidal thoughts 0  0    PHQ-9 Score 4  4    Difficult doing work/chores Not difficult at all  Somewhat difficult      Allergies  Allergen Reactions   Codeine Nausea And Vomiting   Lisinopril Cough   Nsaids Other (See Comments)    CKD   Cymbalta [Duloxetine Hcl] Other (See Comments)    Halucinations   Dapagliflozin Rash   Latex Hives   Social History   Tobacco Use   Smoking status: Former    Current packs/day: 0.00    Average packs/day: 0.3 packs/day for 54.0 years (13.5 ttl pk-yrs)    Types: Cigarettes    Start date: 06/29/1968    Quit date: 06/29/2022    Years since quitting: 0.8   Smokeless tobacco: Never   Substance Use Topics   Alcohol use: Never   Past Medical History:  Diagnosis Date   Acute kidney injury superimposed on chronic kidney disease (HCC) 10/12/2017   Atrial fibrillation with RVR (HCC) 10/27/2017   Back pain    Basilar artery stenosis    on chronic Plavix   Benign prostatic hypertrophy without urinary obstruction 04/17/2014   Carotid artery disease (HCC) 10/06/2010   Carotid US 04/2019: Bilat ICA 40-59; L subclavian stenosis  // Carotid US 11/21: Bilat ICA 40-59; bilateral subclavian stenosis // Carotid US 11/22: Bilateral ICA 40-59; left subclavian stenosis // Carotid US 04/28/2022: Bilateral ICA 40-59; left subclavian stenosis   Cerebral vascular disease    with prior TIA's; followed by Dr. Sandria Manly   Depression, neurotic 11/21/2013   Diabetes mellitus    on insulin   Enthesopathy of ankle and tarsus 09/04/2007   Overview:  Metatarsalgia    Enthesopathy of ankle and tarsus 09/04/2007   Overview:  Metatarsalgia  Formatting of this note might be different from the original. Metatarsalgia  10/1 IMO update   History of renal calculi    Hyperlipidemia    Hypertension    Ischemic heart disease    prior PCI to RCA in 1989. S/P PCI to LAD and OM in 1992. S/P PCI to first DX in 2000. S/P CABG x 3 in May 2011   Lambda light chain myeloma (HCC) 10/28/2021   Left hip pain 01/03/2020   OSA (obstructive sleep apnea) 01/11/2018   AHI 18.1 and SaO2 low 73%   PAD (peripheral artery disease) (HCC) 11/18/2017   ABIs/Arterial US 04/2019: R 1.21; L 0.66 // R SFA 30-49, stable > 50 CIA and EIA stenosis; L > 50 CIA stenosis (likely represents severe stenosis or short segment occlusion)   Peripheral neuropathy    Pneumonia 02/2011   Sacroiliac joint dysfunction of left side 01/03/2020   Stroke (HCC) 04/16/2001   small right cerebellar infarct on 04/16/2001 at that time he was found to have proximal left vertebral artery, proximal left common carotid artery and both external carotid artery  stenosis as well as intracranial stenosis involving mid basilar artery- 07/2011 add questionable  TIA   Past Surgical History:  Procedure Laterality Date    NASAL ENDOSCOPY  01/11/2020   chronic rhinitis w/o evidence of acute sinusitis, bilateral inferior turbinate hypertrophy   ABDOMINAL AORTOGRAM W/LOWER EXTREMITY Bilateral 05/30/2019   Procedure: ABDOMINAL AORTOGRAM W/LOWER EXTREMITY;  Surgeon: Iran Ouch, MD;  Location: [facility];  Service: Cardiovascular;  Laterality: Bilateral;   ANGIOPLASTY  1989   right coronary artery   ANGIOPLASTY  1992   LAD and OM   ANGIOPLASTY  1998   First DX   BIOPSY  05/06/2023   Procedure: BIOPSY;  Surgeon: Jenel Lucks, MD;  Location: [facility];  Service: Gastroenterology;;   BRAIN SURGERY     on prior records   CORONARY ARTERY BYPASS GRAFT  11/12/2009   LIMA to LAD, SVG to OM and SVG to RCA   CORONARY STENT PLACEMENT  2000   Stent to LAD/Circumflex with angioplasty to first diagonal    ENTEROSCOPY N/A 05/06/2023   Procedure: ENTEROSCOPY;  Surgeon: Jenel Lucks, MD;  Location: [facility];  Service: Gastroenterology;  Laterality: N/A;   HOT HEMOSTASIS N/A 05/06/2023   Procedure: HOT HEMOSTASIS (ARGON PLASMA COAGULATION/BICAP);  Surgeon: Jenel Lucks, MD;  Location: [facility];  Service: Gastroenterology;  Laterality: N/A;   LEFT HEART CATH AND CORS/GRAFTS ANGIOGRAPHY N/A 10/13/2017   Procedure: LEFT HEART CATH AND CORS/GRAFTS ANGIOGRAPHY;  Surgeon: Yvonne Kendall, MD;  Location: [facility];  Service: Cardiovascular;  Laterality: N/A;   SUBMUCOSAL TATTOO INJECTION  05/06/2023   Procedure: SUBMUCOSAL TATTOO INJECTION;  Surgeon: Jenel Lucks, MD;  Location: [facility];  Service: Gastroenterology;;   Family History  Problem Relation Age of Onset   Depression Mother    Early death Mother    Kidney disease Mother    Ovarian cancer Mother    Hypertension Father    Heart disease Father    Heart  attack Father    Asthma Brother    Diabetes Brother    Stroke Other        Uncle   Diabetes Sister    Colon cancer Neg Hx    Rectal cancer Neg Hx    Stomach cancer Neg Hx    Esophageal cancer Neg Hx    Allergies as of 05/11/2023       Reactions   Codeine Nausea And Vomiting   Lisinopril Cough   Nsaids Other (See Comments)   CKD   Cymbalta [duloxetine Hcl] Other (See Comments)   Halucinations   Dapagliflozin Rash   Latex Hives        Medication List        Accurate as of May 11, 2023  3:55 PM. If you have any questions, ask your nurse or doctor.          STOP taking these medications    famciclovir 250 MG tablet Commonly known as: FAMVIR Stopped by: Felix Pacini       TAKE these medications    acetaminophen 500 MG tablet Commonly known as: TYLENOL Take 1,000 mg by mouth every 6 (six) hours as needed for mild pain (pain score 1-3) or moderate pain (pain score 4-6).   apixaban 5 MG Tabs tablet Commonly known as: Eliquis Take 1 tablet (5 mg total) by mouth 2 (two) times daily. Further refills from cardio What changed: additional instructions Changed by: Felix Pacini   BD Pen Needle Nano 2nd Gen 32G X 4 MM Misc Generic drug: Insulin Pen Needle Place 1 Needle onto  the skin in the morning and at bedtime.   Bismuth 262 MG Chew Chew 524 mg by mouth in the morning, at noon, in the evening, and at bedtime. Started by: Nurse Kerrie Buffalo   dofetilide 125 MCG capsule Commonly known as: TIKOSYN Take 1 capsule (125 mcg total) by mouth 2 (two) times daily.   doxycycline 100 MG capsule Commonly known as: VIBRAMYCIN Take 1 capsule (100 mg total) by mouth 2 (two) times daily. Started by: Nurse Kerrie Buffalo   ezetimibe 10 MG tablet Commonly known as: ZETIA Take 1 tablet (10 mg total) by mouth daily.   folic acid 1 MG tablet Commonly known as: FOLVITE Take 2 tablets (2 mg total) by mouth in the morning and at bedtime. What changed: when to take this Changed by:  Felix Pacini   gabapentin 300 MG capsule Commonly known as: NEURONTIN Take 1 capsule (300 mg total) by mouth 3 (three) times daily.   GENTEAL OP Place 1 drop into both eyes daily as needed (dry eyes).   insulin glargine 100 UNIT/ML Solostar Pen Commonly known as: LANTUS Inject 30-40 Units into the skin 2 (two) times daily.   loratadine 10 MG tablet Commonly known as: CLARITIN Take 10 mg by mouth daily as needed for allergies.   metoprolol tartrate 25 MG tablet Commonly known as: LOPRESSOR Take 0.5 tablets (12.5 mg total) by mouth 2 (two) times daily.   metroNIDAZOLE 250 MG tablet Commonly known as: FLAGYL Take 1 tablet (250 mg total) by mouth 4 (four) times daily. Started by: Nurse Kerrie Buffalo   nitroGLYCERIN 0.4 MG SL tablet Commonly known as: NITROSTAT Place 0.4 mg under the tongue every 5 (five) minutes as needed for chest pain.   olmesartan 5 MG tablet Commonly known as: BENICAR Take 5 mg by mouth daily.   ondansetron 8 MG tablet Commonly known as: ZOFRAN Take 1 tablet (8 mg total) by mouth every 8 (eight) hours as needed for nausea or vomiting.   OneTouch Delica Plus Lancet33G Misc USE UP TO FOUR TIMES DAILY AS DIRECTED   OneTouch Verio test strip Generic drug: glucose blood USE UP TO FOUR TIMES DAILY AS DIRECTED.   pantoprazole 40 MG tablet Commonly known as: PROTONIX Take 1 tablet (40 mg total) by mouth daily. What changed: Another medication with the same name was added. Make sure you understand how and when to take each. Changed by: Nurse Kerrie Buffalo   pantoprazole 40 MG tablet Commonly known as: Protonix Take 1 tablet (40 mg total) by mouth 2 (two) times daily before a meal. What changed: You were already taking a medication with the same name, and this prescription was added. Make sure you understand how and when to take each. Changed by: Nurse Kerrie Buffalo   polyethylene glycol powder 17 GM/SCOOP powder Commonly known as: GLYCOLAX/MIRALAX Take 1 Container by  mouth daily.   rosuvastatin 20 MG tablet Commonly known as: CRESTOR Take 1 tablet (20 mg total) by mouth at bedtime.   traZODone 50 MG tablet Commonly known as: DESYREL Take 0.5-1 tablets (25-50 mg total) by mouth at bedtime as needed for sleep.        All past medical history, surgical history, allergies, family history, immunizations and medications were updated in the EMR today and reviewed under the history and medication portions of their EMR.      ROS: Negative, with the exception of above mentioned in HPI   Objective:  BP 130/60   Pulse (!) 55   Temp  98.1 F (36.7 C)   Wt 163 lb 12.8 oz (74.3 kg)   SpO2 98%   BMI 24.91 kg/m  Body mass index is 24.91 kg/m. Physical Exam Vitals and nursing note reviewed.  Constitutional:      General: He is not in acute distress.    Appearance: Normal appearance. He is not ill-appearing, toxic-appearing or diaphoretic.  HENT:     Head: Normocephalic and atraumatic.  Eyes:     General: No scleral icterus.       Right eye: No discharge.        Left eye: No discharge.     Extraocular Movements: Extraocular movements intact.     Pupils: Pupils are equal, round, and reactive to light.  Cardiovascular:     Rate and Rhythm: Normal rate and regular rhythm.     Heart sounds: No murmur heard. Pulmonary:     Effort: Pulmonary effort is normal. No respiratory distress.     Breath sounds: Normal breath sounds. No wheezing, rhonchi or rales.  Musculoskeletal:     Right lower leg: No edema.     Left lower leg: No edema.  Skin:    General: Skin is warm.     Capillary Refill: Capillary refill takes 2 to 3 seconds.     Findings: No rash.  Neurological:     Mental Status: He is alert and oriented to person, place, and time. Mental status is at baseline.     Motor: No weakness.  Psychiatric:        Mood and Affect: Mood normal.        Behavior: Behavior normal.        Thought Content: Thought content normal.        Judgment: Judgment  normal.     Assessment/Plan: KRISHIV ROESSLER is a 77 y.o. male present for OV for Hospital discharge follow up Gastrointestinal hemorrhage, unspecified gastrointestinal hemorrhage type Patient is feeling improved.  CBC collected today. - CBC with Differential/Platelet Continue Protonix 40 mg daily, 90-day prescription and refills provided today. Patient does have follow-up with GI and his hematology team  Persistent atrial fibrillation [facility]) Patient understands he has stopped baby aspirin. Unfortunately, his Eliquis prescription was accidentally discontinued on his hospital discharge.  I did call in a 90-day prescription for him today to bridge him until he sees a specialty team. - apixaban (ELIQUIS) 5 MG TABS tablet; Take 1 tablet (5 mg total) by mouth 2 (two) times daily. Further refills from cardio  Dispense: 180 tablet; Refill: 0  Acute on chronic anemia Patient has follow-up with GI and his hematology team Patient reports he has not been able to pick up the folic acid prescription, upon further review it does not appear it was called in at hospital discharge, but was more "OTC ". I did prescribe in the folic acid today, oncology can take over management as they see appropriate. - folic acid (FOLVITE) 1 MG tablet; Take 2 tablets (2 mg total) by mouth in the morning and at bedtime.  Dispense: 120 tablet; Refill: 5  Reviewed expectations re: course of current medical issues. Discussed self-management of symptoms. Outlined signs and symptoms indicating need for more acute intervention. Patient verbalized understanding and all questions were answered. Patient received an After-Visit Summary. Any changes in medications were reviewed and patient was provided with updated med list with their AVS.     Orders Placed This Encounter  Procedures   CBC with Differential/Platelet   Meds ordered this encounter  Medications   apixaban (ELIQUIS) 5 MG TABS tablet    Sig: Take 1 tablet (5 mg  total) by mouth 2 (two) times daily. Further refills from cardio    Dispense:  180 tablet    Refill:  0    90 day supply   pantoprazole (PROTONIX) 40 MG tablet    Sig: Take 1 tablet (40 mg total) by mouth daily.    Dispense:  90 tablet    Refill:  1   folic acid (FOLVITE) 1 MG tablet    Sig: Take 2 tablets (2 mg total) by mouth in the morning and at bedtime.    Dispense:  120 tablet    Refill:  5     Note is dictated utilizing voice recognition software. Although note has been proof read prior to signing, occasional typographical errors still can be missed. If any questions arise, please do not hesitate to call for verification.   electronically signed by:  Felix Pacini, DO  [facility] - [place]

## 2023-05-12 ENCOUNTER — Telehealth: Payer: Self-pay | Admitting: *Deleted

## 2023-05-12 LAB — IMMUNOFIXATION REFLEX, SERUM
IgA: 23 mg/dL — ABNORMAL LOW (ref 61–437)
IgG (Immunoglobin G), Serum: 200 mg/dL — ABNORMAL LOW (ref 603–1613)
IgM (Immunoglobulin M), Srm: 6 mg/dL — ABNORMAL LOW (ref 15–143)

## 2023-05-12 LAB — PROTEIN ELECTROPHORESIS, SERUM, WITH REFLEX
A/G Ratio: 1.9 — ABNORMAL HIGH (ref 0.7–1.7)
Albumin ELP: 3.7 g/dL (ref 2.9–4.4)
Alpha-1-Globulin: 0.3 g/dL (ref 0.0–0.4)
Alpha-2-Globulin: 0.6 g/dL (ref 0.4–1.0)
Beta Globulin: 0.9 g/dL (ref 0.7–1.3)
Gamma Globulin: 0.2 g/dL — ABNORMAL LOW (ref 0.4–1.8)
Globulin, Total: 2 g/dL — ABNORMAL LOW (ref 2.2–3.9)
SPEP Interpretation: 0
Total Protein ELP: 5.7 g/dL — ABNORMAL LOW (ref 6.0–8.5)

## 2023-05-12 NOTE — Telephone Encounter (Signed)
The patient has been made that per Dr. Kirke Corin, he is not to resume the aspirin.    Iran Ouch, MD  Alberteen Sam, MD; Sandi Mariscal, RN Misty Stanley, This patient was hospitalized recently with GI bleed.  He is on Eliquis for anticoagulation.  Given no recent stenting, I do not recommend resuming aspirin at this time.  Please let him know.  Thanks.

## 2023-05-13 ENCOUNTER — Other Ambulatory Visit (INDEPENDENT_AMBULATORY_CARE_PROVIDER_SITE_OTHER): Payer: 59

## 2023-05-13 DIAGNOSIS — A048 Other specified bacterial intestinal infections: Secondary | ICD-10-CM | POA: Diagnosis not present

## 2023-05-13 LAB — CBC WITH DIFFERENTIAL/PLATELET
Basophils Absolute: 0 10*3/uL (ref 0.0–0.1)
Basophils Relative: 0.5 % (ref 0.0–3.0)
Eosinophils Absolute: 0.1 10*3/uL (ref 0.0–0.7)
Eosinophils Relative: 1.8 % (ref 0.0–5.0)
HCT: 27.1 % — ABNORMAL LOW (ref 39.0–52.0)
Hemoglobin: 8.7 g/dL — ABNORMAL LOW (ref 13.0–17.0)
Lymphocytes Relative: 8.1 % — ABNORMAL LOW (ref 12.0–46.0)
Lymphs Abs: 0.4 10*3/uL — ABNORMAL LOW (ref 0.7–4.0)
MCHC: 32.1 g/dL (ref 30.0–36.0)
MCV: 82.3 fL (ref 78.0–100.0)
Monocytes Absolute: 0.3 10*3/uL (ref 0.1–1.0)
Monocytes Relative: 6.8 % (ref 3.0–12.0)
Neutro Abs: 4 10*3/uL (ref 1.4–7.7)
Neutrophils Relative %: 82.8 % — ABNORMAL HIGH (ref 43.0–77.0)
Platelets: 148 10*3/uL — ABNORMAL LOW (ref 150.0–400.0)
RBC: 3.29 Mil/uL — ABNORMAL LOW (ref 4.22–5.81)
RDW: 17.7 % — ABNORMAL HIGH (ref 11.5–15.5)
WBC: 4.8 10*3/uL (ref 4.0–10.5)

## 2023-05-13 NOTE — Progress Notes (Signed)
Marvin Salinas,  Your hemoglobin is stable from last week, indicating no further bleeding.  Let's check again in 1 month.

## 2023-05-15 ENCOUNTER — Encounter (HOSPITAL_COMMUNITY): Payer: Self-pay

## 2023-05-16 ENCOUNTER — Other Ambulatory Visit: Payer: Self-pay

## 2023-05-16 ENCOUNTER — Telehealth (HOSPITAL_COMMUNITY): Payer: Self-pay | Admitting: *Deleted

## 2023-05-16 DIAGNOSIS — K921 Melena: Secondary | ICD-10-CM

## 2023-05-16 NOTE — Patient Outreach (Signed)
  Care Management  Transitions of Care Program Transitions of Care Post-discharge week 2  05/16/2023 Name: Marvin Salinas MRN: [id number] DOB: May 04, 1946  Subjective: Marvin Salinas is a 77 y.o. year old male who is a primary care patient of Kuneff, Renee A, DO. The Care Management team was unable to reach the patient by phone to assess and address transitions of care needs.   Plan: Additional outreach attempts will be made to reach the patient enrolled in the [facility] Program (Post Inpatient/ED Visit).  Lonia Chimera, RN, BSN, CEN [employer]- Transition of Care Team.  [facility] [phone]

## 2023-05-16 NOTE — Telephone Encounter (Signed)
Attempted to call patient regarding upcoming cardiac PET appointment. Left message on voicemail with name and callback number  Larey Brick RN Navigator Cardiac Imaging [facility] [phone] Office [phone] Cell  Reminder to avoid caffeine 12 hours prior to cardiac PET study.

## 2023-05-17 ENCOUNTER — Telehealth: Payer: Self-pay

## 2023-05-17 ENCOUNTER — Ambulatory Visit (HOSPITAL_COMMUNITY)
Admission: RE | Admit: 2023-05-17 | Discharge: 2023-05-17 | Disposition: A | Discharge status: Home / Self Care | Payer: 59 | Source: Ambulatory Visit | Attending: Cardiovascular Disease | Admitting: Cardiovascular Disease

## 2023-05-17 DIAGNOSIS — R079 Chest pain, unspecified: Secondary | ICD-10-CM | POA: Diagnosis not present

## 2023-05-17 LAB — NM PET CT CARDIAC PERFUSION MULTI W/ABSOLUTE BLOODFLOW
LV dias vol: 144 mL (ref 62–150)
Nuc Rest EF: 49 %
Nuc Stress EF: 50 %
Peak HR: 76 {beats}/min
Rest HR: 62 {beats}/min
Rest Nuclear Isotope Dose: 19.4 mCi
Rest perfusion cavity size (mL): 144 mL
ST Depression (mm): 0 mm
Stress Nuclear Isotope Dose: 19.5 mCi
Stress perfusion cavity size (mL): 155 mL
TID: 1.09

## 2023-05-17 MED ORDER — REGADENOSON 0.4 MG/5ML IV SOLN
0.4000 mg | Freq: Once | INTRAVENOUS | Status: AC
  Administered 2023-05-17: 0.4 mg via INTRAVENOUS

## 2023-05-17 MED ORDER — RUBIDIUM RB82 GENERATOR (RUBYFILL)
19.5000 | PACK | Freq: Once | INTRAVENOUS | Status: AC
  Administered 2023-05-17: 19.5 via INTRAVENOUS

## 2023-05-17 MED ORDER — RUBIDIUM RB82 GENERATOR (RUBYFILL)
19.4000 | PACK | Freq: Once | INTRAVENOUS | Status: AC
  Administered 2023-05-17: 19.4 via INTRAVENOUS

## 2023-05-17 MED ORDER — REGADENOSON 0.4 MG/5ML IV SOLN
INTRAVENOUS | Status: AC
  Filled 2023-05-17: qty 5

## 2023-05-17 NOTE — Progress Notes (Signed)
Tolerated test well

## 2023-05-17 NOTE — Patient Outreach (Signed)
  Care Management  Transitions of Care Program Transitions of Care Post-discharge week 2  05/17/2023 Name: Marvin Salinas MRN: [id number] DOB: [birth year]  Subjective: Marvin Salinas is a 77 y.o. year old male who is a primary care patient of Kuneff, Renee A, DO. The Care Management team was unable to reach the patient by phone to assess and address transitions of care needs.   Plan: Additional outreach attempts will be made to reach the patient enrolled in the [facility] Program (Post Inpatient/ED Visit).  Lonia Chimera, RN, BSN, CEN [employer]- Transition of Care Team.  [facility] [phone]

## 2023-05-18 ENCOUNTER — Telehealth: Payer: Self-pay

## 2023-05-18 NOTE — Patient Outreach (Signed)
  Care Management  Transitions of Care Program Transitions of Care Post-discharge week 2  05/18/2023 Name: Marvin Salinas MRN: [id number] DOB: March 27, 1946  Subjective: Marvin Salinas is a 77 y.o. year old male who is a primary care patient of Kuneff, Renee A, DO. The Care Management team was unable to reach the patient by phone to assess and address transitions of care needs.   Plan: No further outreach attempts will be made at this time.  We have been unable to reach the patient.  Lonia Chimera, RN, BSN, CEN [employer]- Transition of Care Team.  [facility] [phone]

## 2023-05-19 IMAGING — US US RENAL
1 series · 14 of 25 positions shown · non-contrast
Comparison: 10/12/2017

CLINICAL DATA: Chronic kidney disease

EXAM:
RENAL / URINARY TRACT ULTRASOUND COMPLETE

[Series 1: us renal · 14 of 49 slices shown]
[im 1/49]
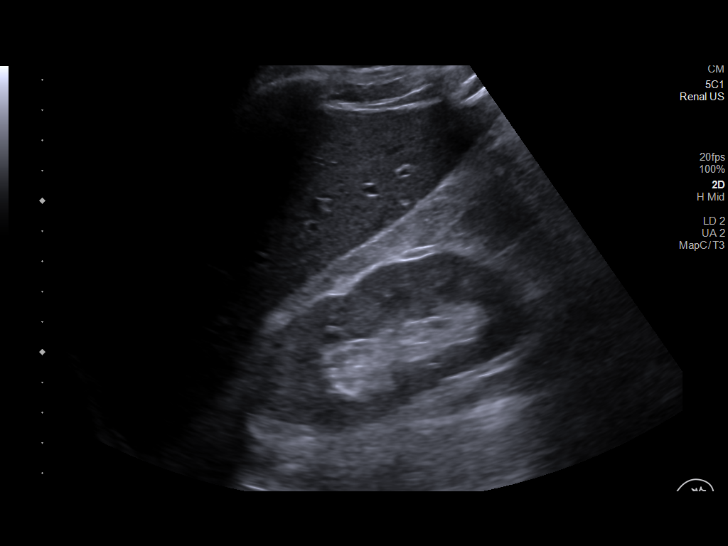
[im 5/49]
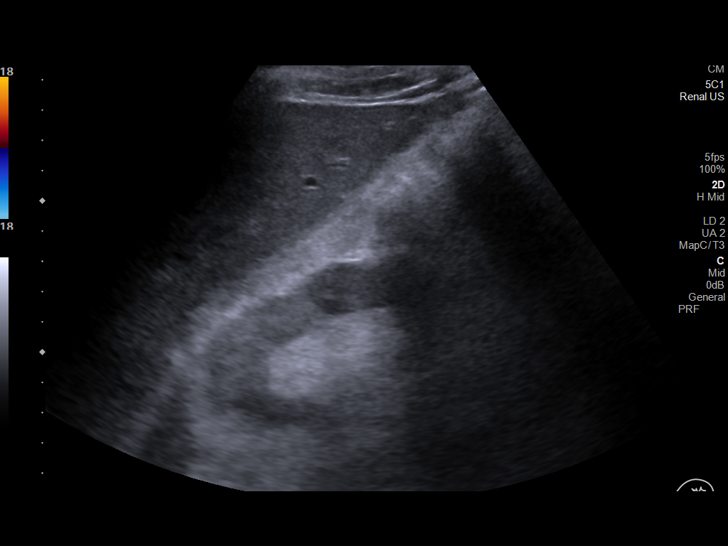
[im 9/49]
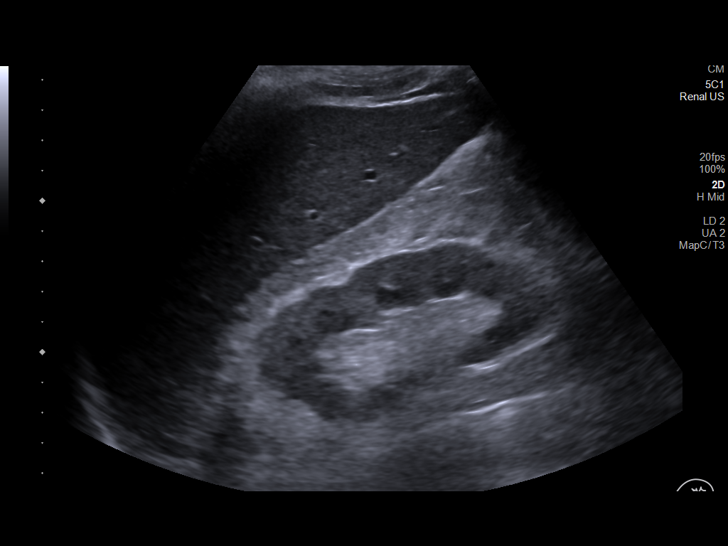
[im 13/49]
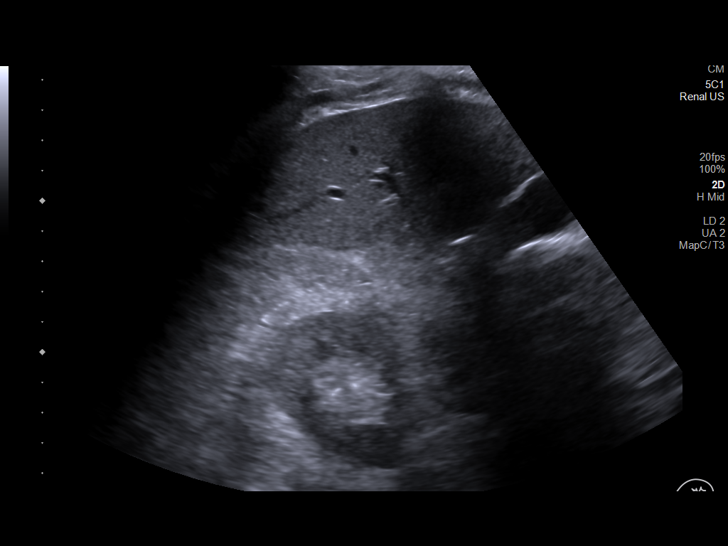
[im 17/49]
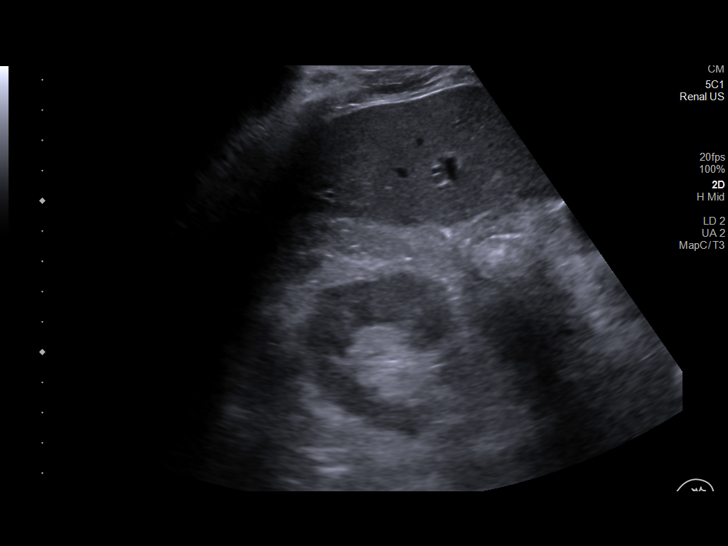
[im 19/49]
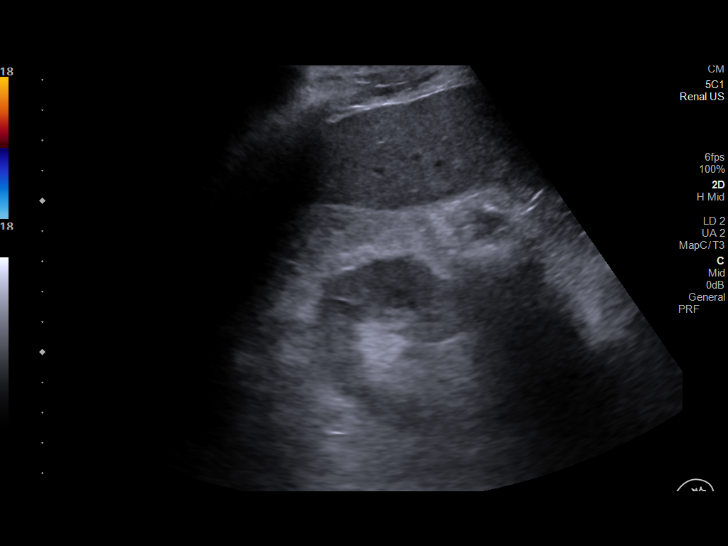
[im 23/49]
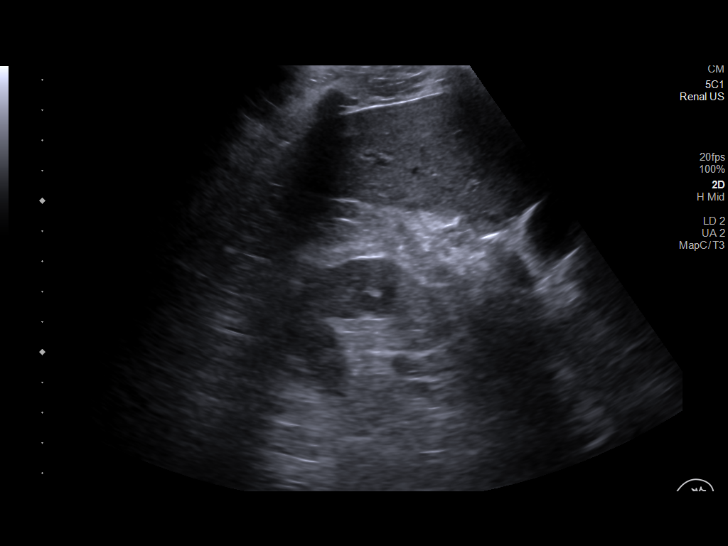
[im 27/49]
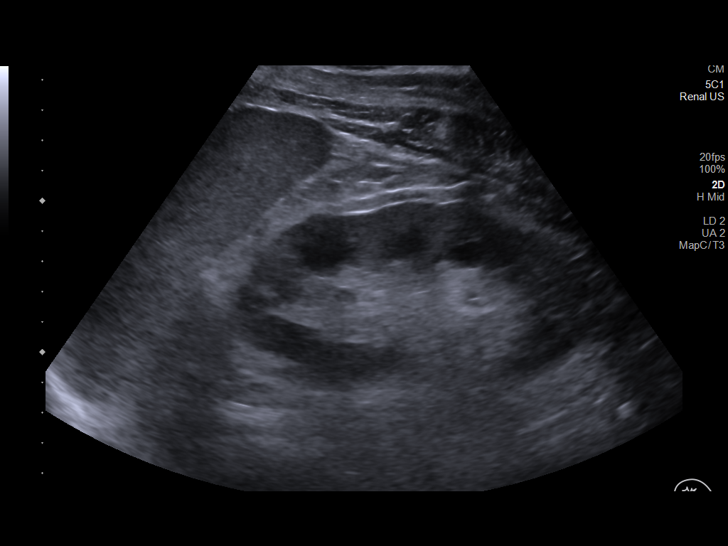
[im 31/49]
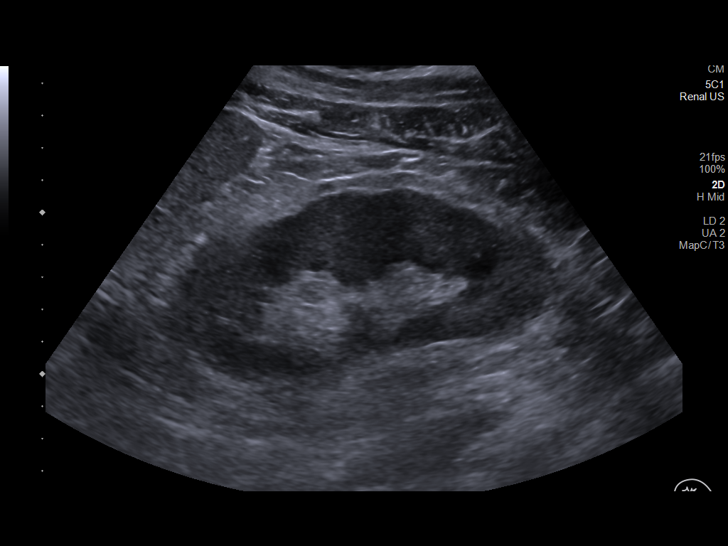
[im 33/49]
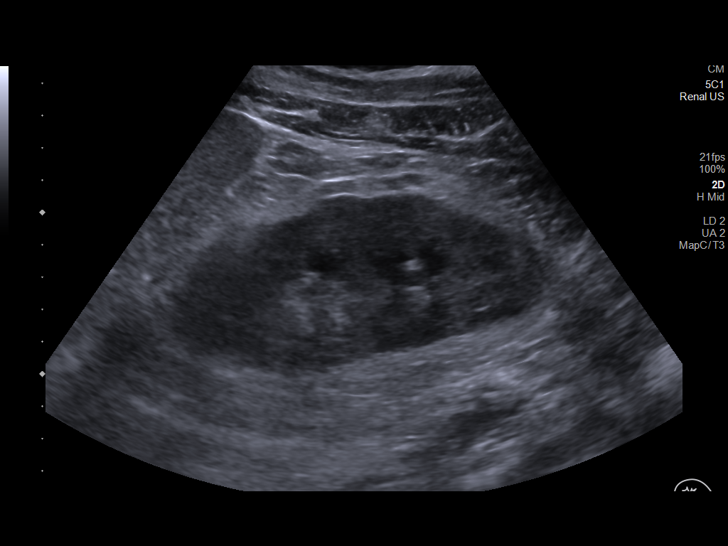
[im 37/49]
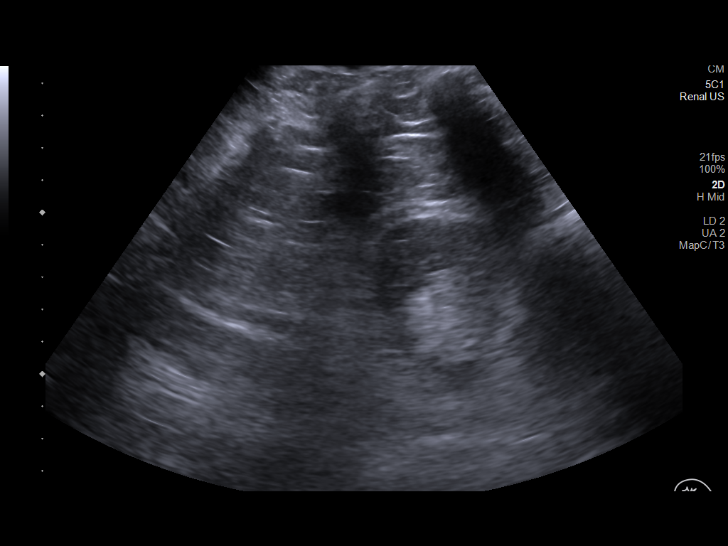
[im 41/49]
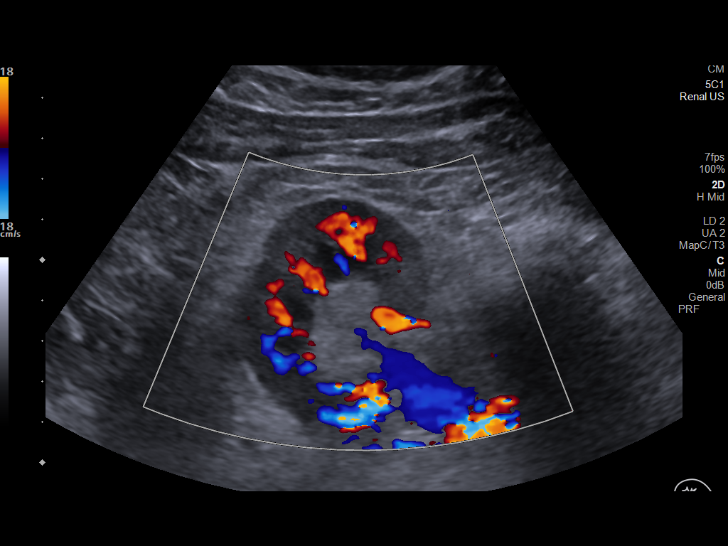
[im 45/49]
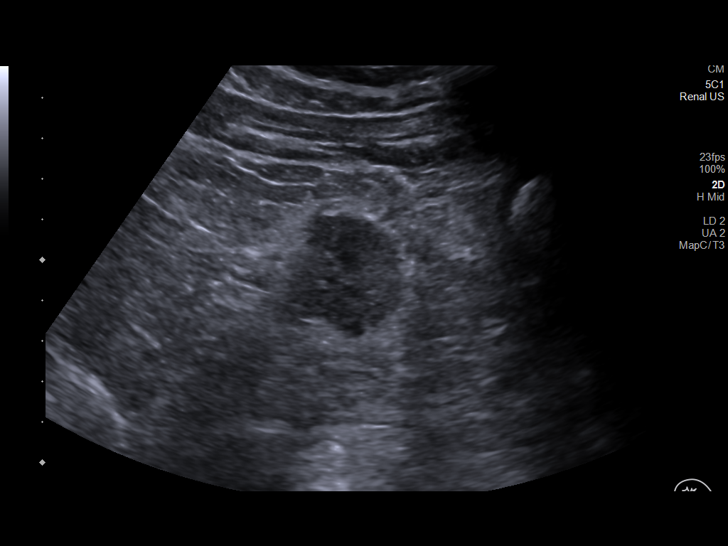
[im 49/49]
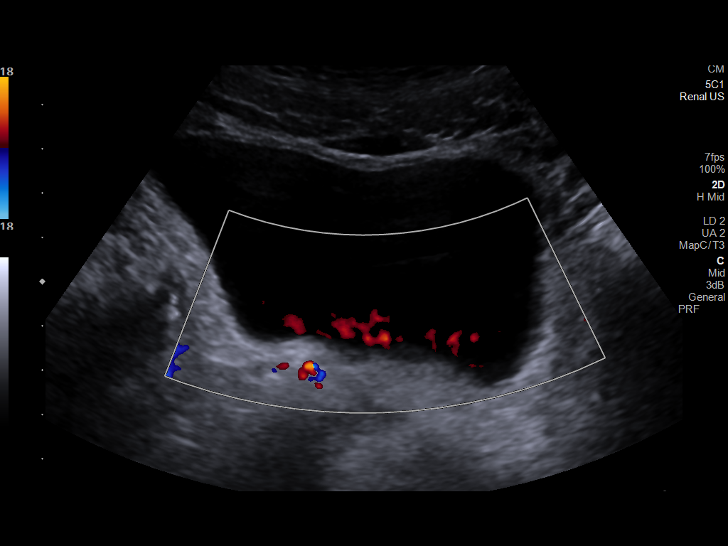

[14 of 25 positions shown; findings below may reference images not displayed]

FINDINGS: Right Kidney:

Renal measurements: 10.5 x 4.9 x 6.1 cm = volume: 162 mL.
Echogenicity within normal limits. No mass or hydronephrosis
visualized.

Left Kidney:

Renal measurements: 11.6 x 5.7 x 5.3 cm = volume: 184 mL.
Echogenicity within normal limits. No mass or hydronephrosis
visualized.

Bladder:

Appears normal for degree of bladder distention.

Other:

None.
IMPRESSION: Normal ultrasound examination of the bilateral kidneys. No
hydronephrosis.

## 2023-05-21 ENCOUNTER — Encounter (HOSPITAL_COMMUNITY): Payer: Self-pay

## 2023-05-21 ENCOUNTER — Emergency Department (HOSPITAL_COMMUNITY)
Admission: EM | Admit: 2023-05-21 | Discharge: 2023-05-21 | Disposition: A | Discharge status: Home / Self Care | Payer: 59 | Attending: Emergency Medicine | Admitting: Emergency Medicine

## 2023-05-21 ENCOUNTER — Other Ambulatory Visit: Payer: Self-pay

## 2023-05-21 DIAGNOSIS — K625 Hemorrhage of anus and rectum: Secondary | ICD-10-CM | POA: Insufficient documentation

## 2023-05-21 DIAGNOSIS — Z794 Long term (current) use of insulin: Secondary | ICD-10-CM | POA: Diagnosis not present

## 2023-05-21 DIAGNOSIS — I4891 Unspecified atrial fibrillation: Secondary | ICD-10-CM | POA: Diagnosis not present

## 2023-05-21 DIAGNOSIS — Z91040 Latex allergy status: Secondary | ICD-10-CM | POA: Diagnosis not present

## 2023-05-21 DIAGNOSIS — Z7901 Long term (current) use of anticoagulants: Secondary | ICD-10-CM | POA: Diagnosis not present

## 2023-05-21 DIAGNOSIS — K921 Melena: Secondary | ICD-10-CM

## 2023-05-21 LAB — CBC WITH DIFFERENTIAL/PLATELET
Abs Immature Granulocytes: 0.01 10*3/uL (ref 0.00–0.07)
Basophils Absolute: 0 10*3/uL (ref 0.0–0.1)
Basophils Relative: 1 %
Eosinophils Absolute: 0.1 10*3/uL (ref 0.0–0.5)
Eosinophils Relative: 2 %
HCT: 31.8 % — ABNORMAL LOW (ref 39.0–52.0)
Hemoglobin: 9.9 g/dL — ABNORMAL LOW (ref 13.0–17.0)
Immature Granulocytes: 0 %
Lymphocytes Relative: 16 %
Lymphs Abs: 0.6 10*3/uL — ABNORMAL LOW (ref 0.7–4.0)
MCH: 26.1 pg (ref 26.0–34.0)
MCHC: 31.1 g/dL (ref 30.0–36.0)
MCV: 83.9 fL (ref 80.0–100.0)
Monocytes Absolute: 0.4 10*3/uL (ref 0.1–1.0)
Monocytes Relative: 11 %
Neutro Abs: 2.7 10*3/uL (ref 1.7–7.7)
Neutrophils Relative %: 70 %
Platelets: 220 10*3/uL (ref 150–400)
RBC: 3.79 MIL/uL — ABNORMAL LOW (ref 4.22–5.81)
RDW: 15.9 % — ABNORMAL HIGH (ref 11.5–15.5)
WBC: 3.9 10*3/uL — ABNORMAL LOW (ref 4.0–10.5)
nRBC: 0 % (ref 0.0–0.2)

## 2023-05-21 LAB — COMPREHENSIVE METABOLIC PANEL
ALT: 22 U/L (ref 0–44)
AST: 23 U/L (ref 15–41)
Albumin: 4 g/dL (ref 3.5–5.0)
Alkaline Phosphatase: 51 U/L (ref 38–126)
Anion gap: 7 (ref 5–15)
BUN: 14 mg/dL (ref 8–23)
CO2: 26 mmol/L (ref 22–32)
Calcium: 9.2 mg/dL (ref 8.9–10.3)
Chloride: 102 mmol/L (ref 98–111)
Creatinine, Ser: 1.38 mg/dL — ABNORMAL HIGH (ref 0.61–1.24)
GFR, Estimated: 52 mL/min — ABNORMAL LOW (ref >60)
Glucose, Bld: 229 mg/dL — ABNORMAL HIGH (ref 70–99)
Potassium: 3.6 mmol/L (ref 3.5–5.1)
Sodium: 135 mmol/L (ref 135–145)
Total Bilirubin: 0.3 mg/dL (ref <1.2)
Total Protein: 6.6 g/dL (ref 6.5–8.1)

## 2023-05-21 LAB — SAMPLE TO BLOOD BANK

## 2023-05-21 LAB — LIPASE, BLOOD: Lipase: 32 U/L (ref 11–51)

## 2023-05-21 MED ORDER — ONDANSETRON HCL 4 MG/2ML IJ SOLN
4.0000 mg | Freq: Once | INTRAMUSCULAR | Status: AC
Start: 2023-05-21 — End: 2023-05-21
  Administered 2023-05-21: 4 mg via INTRAVENOUS
  Filled 2023-05-21: qty 2

## 2023-05-21 NOTE — ED Triage Notes (Addendum)
Was seen 11/13 for rectal bleeding. Rectal bleeding has continued. Patient had to have a transfusion the last time he was here. Has followed up with GI and had a scope done.  Also reports intermittent abdominal pain and nausea. Denies SOB, weakness, and fatigue.

## 2023-05-21 NOTE — ED Provider Notes (Signed)
[facility]MENT AT [facility] Provider Note   CSN: [id number] Arrival date & time: 05/21/23  1139     History  Chief Complaint  Patient presents with   Rectal Bleeding    MALEKO PATE is a 77 y.o. male.  Patient is a 77 year old male with past medical history of A-fib on Eliquis presenting for complaints of rectal bleeding.  Patient previously seen in emergency department on 05/04/2023-05/07/2023 and admitted for hematochezia with a hemoglobin of 6.1 received red blood cell transfusion, and had colonoscopy by GI.  Patient presents today for repeat bright red blood from rectum.  He was originally told to take a 3-day pause on his Eliquis but has can sinew taking at this time.  He denies generalized weakness, fatigue, shortness of breath.  The history is provided by the patient. No language interpreter was used.  Rectal Bleeding Associated symptoms: no abdominal pain, no fever and no vomiting        Home Medications Prior to Admission medications   Medication Sig Start Date End Date Taking? Authorizing Provider  acetaminophen (TYLENOL) 500 MG tablet Take 1,000 mg by mouth every 6 (six) hours as needed for mild pain (pain score 1-3) or moderate pain (pain score 4-6).    [provider]  apixaban (ELIQUIS) 5 MG TABS tablet Take 1 tablet (5 mg total) by mouth 2 (two) times daily. Further refills from cardio 05/11/23   Kuneff, Renee A, DO  BD PEN NEEDLE NANO 2ND GEN 32G X 4 MM MISC Place 1 Needle onto the skin in the morning and at bedtime. 12/20/22   [provider]  Bismuth 262 MG CHEW Chew 524 mg by mouth in the morning, at noon, in the evening, and at bedtime. 05/11/23   Jenel Lucks, MD  Carboxymethylcell-Hypromellose (GENTEAL OP) Place 1 drop into both eyes daily as needed (dry eyes).    [provider]  dofetilide (TIKOSYN) 125 MCG capsule Take 1 capsule (125 mcg total) by mouth 2 (two) times daily. 01/20/23   [name],  Deloris Ping, MD  doxycycline (VIBRAMYCIN) 100 MG capsule Take 1 capsule (100 mg total) by mouth 2 (two) times daily. 05/11/23   Jenel Lucks, MD  ezetimibe (ZETIA) 10 MG tablet Take 1 tablet (10 mg total) by mouth daily. 03/28/23   Kuneff, Renee A, DO  folic acid (FOLVITE) 1 MG tablet Take 2 tablets (2 mg total) by mouth in the morning and at bedtime. 05/11/23   Kuneff, Renee A, DO  gabapentin (NEURONTIN) 300 MG capsule Take 1 capsule (300 mg total) by mouth 3 (three) times daily. 02/25/23   Josph Macho, MD  glucose blood (ONETOUCH VERIO) test strip USE UP TO FOUR TIMES DAILY AS DIRECTED. 03/03/23   Kuneff, Renee A, DO  insulin glargine (LANTUS) 100 UNIT/ML Solostar Pen Inject 30-40 Units into the skin 2 (two) times daily. 03/28/23   Kuneff, Renee A, DO  Lancets (ONETOUCH DELICA PLUS LANCET33G) MISC USE UP TO FOUR TIMES DAILY AS DIRECTED 10/28/22   Kuneff, Renee A, DO  loratadine (CLARITIN) 10 MG tablet Take 10 mg by mouth daily as needed for allergies.    [provider]  metoprolol tartrate (LOPRESSOR) 25 MG tablet Take 0.5 tablets (12.5 mg total) by mouth 2 (two) times daily. 10/26/22   [name], Deloris Ping, MD  metroNIDAZOLE (FLAGYL) 250 MG tablet Take 1 tablet (250 mg total) by mouth 4 (four) times daily. 05/11/23   Jenel Lucks, MD  nitroGLYCERIN (NITROSTAT) 0.4 MG SL tablet Place 0.4 mg under the tongue every 5 (five) minutes as needed for chest pain.    [provider]  olmesartan (BENICAR) 5 MG tablet Take 5 mg by mouth daily. 02/24/23   [provider]  ondansetron (ZOFRAN) 8 MG tablet Take 1 tablet (8 mg total) by mouth every 8 (eight) hours as needed for nausea or vomiting. 05/04/23   Josph Macho, MD  pantoprazole (PROTONIX) 40 MG tablet Take 1 tablet (40 mg total) by mouth daily. 05/11/23   Kuneff, Renee A, DO  pantoprazole (PROTONIX) 40 MG tablet Take 1 tablet (40 mg total) by mouth 2 (two) times daily before a meal. 05/11/23   Jenel Lucks, MD   polyethylene glycol powder (GLYCOLAX/MIRALAX) 17 GM/SCOOP powder Take 1 Container by mouth daily.    [provider]  rosuvastatin (CRESTOR) 20 MG tablet Take 1 tablet (20 mg total) by mouth at bedtime. 03/28/23   Kuneff, Renee A, DO  traZODone (DESYREL) 50 MG tablet Take 0.5-1 tablets (25-50 mg total) by mouth at bedtime as needed for sleep. 03/28/23   Kuneff, Renee A, DO  prochlorperazine (COMPAZINE) 10 MG tablet Take 1 tablet (10 mg total) by mouth every 6 (six) hours as needed (Nausea or vomiting). 11/03/21 02/17/22  Josph Macho, MD      Allergies    Codeine, Lisinopril, Nsaids, Cymbalta [duloxetine hcl], Dapagliflozin, and Latex    Review of Systems   Review of Systems  Constitutional:  Negative for chills and fever.  HENT:  Negative for ear pain and sore throat.   Eyes:  Negative for pain and visual disturbance.  Respiratory:  Negative for cough and shortness of breath.   Cardiovascular:  Negative for chest pain and palpitations.  Gastrointestinal:  Positive for blood in stool and hematochezia. Negative for abdominal pain and vomiting.  Genitourinary:  Negative for dysuria and hematuria.  Musculoskeletal:  Negative for arthralgias and back pain.  Skin:  Negative for color change and rash.  Neurological:  Negative for seizures and syncope.  All other systems reviewed and are negative.   Physical Exam Updated Vital Signs BP (!) 166/65 (BP Location: Right Arm)   Pulse (!) 57   Temp 97.8 F (36.6 C) (Oral)   Resp 16   Ht 5\' 8"  (1.727 m)   Wt 74.3 kg   SpO2 98%   BMI 24.91 kg/m  Physical Exam Vitals and nursing note reviewed.  Constitutional:      General: He is not in acute distress.    Appearance: He is well-developed.  HENT:     Head: Normocephalic and atraumatic.  Eyes:     Conjunctiva/sclera: Conjunctivae normal.  Cardiovascular:     Rate and Rhythm: Normal rate and regular rhythm.     Heart sounds: No murmur heard. Pulmonary:     Effort: Pulmonary  effort is normal. No respiratory distress.     Breath sounds: Normal breath sounds.  Abdominal:     Palpations: Abdomen is soft.     Tenderness: There is no abdominal tenderness.  Musculoskeletal:        General: No swelling.     Cervical back: Neck supple.  Skin:    General: Skin is warm and dry.     Capillary Refill: Capillary refill takes less than 2 seconds.  Neurological:     Mental Status: He is alert.  Psychiatric:        Mood and Affect: Mood normal.  ED Results / Procedures / Treatments   Labs (all labs ordered are listed, but only abnormal results are displayed) Labs Reviewed  CBC WITH DIFFERENTIAL/PLATELET - Abnormal; Notable for the following components:      Result Value   WBC 3.9 (*)    RBC 3.79 (*)    Hemoglobin 9.9 (*)    HCT 31.8 (*)    RDW 15.9 (*)    Lymphs Abs 0.6 (*)    All other components within normal limits  COMPREHENSIVE METABOLIC PANEL - Abnormal; Notable for the following components:   Glucose, Bld 229 (*)    Creatinine, Ser 1.38 (*)    GFR, Estimated 52 (*)    All other components within normal limits  LIPASE, BLOOD  SAMPLE TO BLOOD BANK    EKG None  Radiology No results found.  Procedures Procedures    Medications Ordered in ED Medications  ondansetron (ZOFRAN) injection 4 mg (4 mg Intravenous Given 05/21/23 1223)    ED Course/ Medical Decision Making/ A&P                                 Medical Decision Making Amount and/or Complexity of Data Reviewed Labs: ordered.  Risk Prescription drug management.   42:18 PM 77 year old male with past medical history of A-fib on Eliquis presenting for complaints of rectal bleeding.  Patient is alert and oriented x 3, no acute distress, afebrile, stable vital signs.  No pallor.  Abdomen is soft and nontender on exam.  Chart review demonstrates patient was seen on 11/13 through 05/07/2023 for hematochezia with hemoglobin of 6.1, received red blood cell transfusion, and had  colonoscopy with [facility].  Patient's endoscopy at this time was positive for H. pylori gastritis.  He was started on the proper antibiotic therapy with PPI.  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  Hbg 9.6 up from 8.2 when discharged from hospital (hemoglobin down to 5.7 before blood transfusion during admission).   At this time patient is hemodynamically stable with a stable hemoglobin.  I spoke with his GI specialist team with [facility] who agrees that the benefit of stopping Eliquis at this time outweighs the risk of stroke from A-fib.  They also recommend looping the patient's cardiologist and the conversation for Eliquis recommendations.  Discussion held with the patient with risk first benefits discussed.  Patient in no distress and overall condition improved here in the ED. Detailed discussions were had with the patient regarding current findings, and need for close f/u with PCP or on call doctor. The patient has been instructed to return immediately if the symptoms worsen in any way for re-evaluation. Patient verbalized understanding and is in agreement with current care plan. All questions answered prior to discharge.         Final Clinical Impression(s) / ED Diagnoses Final diagnoses:  Hematochezia    Rx / DC Orders ED Discharge Orders     None         Franne Forts, DO 05/21/23 1514

## 2023-05-21 NOTE — Discharge Instructions (Signed)
Please stop your Eliquis at this time and follow-up closely with your cardiologist for further recommendations given GI bleeding with multiple visits to the emergency department including an admission to the hospital and blood transfusion.    Please call make an appointment with your GI specialist in the next 5 to 7 days if blood in your stool does not improve after stopping Eliquis.

## 2023-05-22 ENCOUNTER — Encounter: Payer: Self-pay | Admitting: Family

## 2023-05-23 ENCOUNTER — Telehealth: Payer: Self-pay

## 2023-05-23 NOTE — Telephone Encounter (Signed)
-----   Message from Imogene Burn sent at 05/21/2023  3:12 PM EST ----- Hi Shun Pletz, patient was seen in the ED for hematochezia but Hb was actually better than it has been in the past so he was discharged. ED requested that this patient be scheduled for GI clinic follow up. I think technically this would be Dr. Elesa Hacker patient now because Dr. Christella Hartigan is retired, though he has seen Dr. Barron Alvine in the past as well... Please schedule clinic follow up in 2-3 weeks with APP. Looks like he has history of H pylori gastritis and small bowel AVMs. Thank you.

## 2023-05-23 NOTE — Telephone Encounter (Signed)
This is a Dr Tomasa Rand pt and has appt on 12/6 for follow up.

## 2023-05-24 ENCOUNTER — Encounter: Payer: Self-pay | Admitting: Family

## 2023-05-27 ENCOUNTER — Encounter: Payer: Self-pay | Admitting: Gastroenterology

## 2023-05-27 ENCOUNTER — Ambulatory Visit (INDEPENDENT_AMBULATORY_CARE_PROVIDER_SITE_OTHER): Payer: 59 | Admitting: Gastroenterology

## 2023-05-27 ENCOUNTER — Other Ambulatory Visit: Payer: Self-pay

## 2023-05-27 VITALS — BP 124/70 | HR 62 | Ht 68.0 in | Wt 168.0 lb

## 2023-05-27 DIAGNOSIS — R9439 Abnormal result of other cardiovascular function study: Secondary | ICD-10-CM

## 2023-05-27 DIAGNOSIS — I25810 Atherosclerosis of coronary artery bypass graft(s) without angina pectoris: Secondary | ICD-10-CM

## 2023-05-27 DIAGNOSIS — K5521 Angiodysplasia of colon with hemorrhage: Secondary | ICD-10-CM | POA: Diagnosis not present

## 2023-05-27 DIAGNOSIS — I4892 Unspecified atrial flutter: Secondary | ICD-10-CM | POA: Diagnosis not present

## 2023-05-27 DIAGNOSIS — Z7901 Long term (current) use of anticoagulants: Secondary | ICD-10-CM | POA: Diagnosis not present

## 2023-05-27 DIAGNOSIS — A048 Other specified bacterial intestinal infections: Secondary | ICD-10-CM | POA: Diagnosis not present

## 2023-05-27 NOTE — Patient Instructions (Signed)
Your provider has requested that you go to the basement level for lab work before leaving today. Press "B" on the elevator. The lab is located at the first door on the left as you exit the elevator.  Pick up your stool kit today but do not complete it until a month from now.  _______________________________________________________  If your blood pressure at your visit was 140/90 or greater, please contact your primary care physician to follow up on this.  _______________________________________________________  If you are age 75 or older, your body mass index should be between 23-30. Your Body mass index is 25.54 kg/m. If this is out of the aforementioned range listed, please consider follow up with your Primary Care Provider.  If you are age 105 or younger, your body mass index should be between 19-25. Your Body mass index is 25.54 kg/m. If this is out of the aformentioned range listed, please consider follow up with your Primary Care Provider.   ________________________________________________________  The [facility] providers would like to encourage you to use [facility] to communicate with providers for non-urgent requests or questions.  Due to long hold times on the telephone, sending your provider a message by [facility] may be a faster and more efficient way to get a response.  Please allow 48 business hours for a response.  Please remember that this is for non-urgent requests.    It was a pleasure to see you today!  Thank you for trusting me with your gastrointestinal care!    Scott E.Tomasa Rand, MD

## 2023-05-27 NOTE — Progress Notes (Unsigned)
HPI :   Cardiology Tuesday Off blood thinners since nov 30th Resume eliquis    Past Medical History:  Diagnosis Date   Acute kidney injury superimposed on chronic kidney disease (HCC) 10/12/2017   Atrial fibrillation with RVR (HCC) 10/27/2017   Back pain    Basilar artery stenosis    on chronic Plavix   Benign prostatic hypertrophy without urinary obstruction 04/17/2014   Carotid artery disease (HCC) 10/06/2010   Carotid US 04/2019: Bilat ICA 40-59; L subclavian stenosis  // Carotid US 11/21: Bilat ICA 40-59; bilateral subclavian stenosis // Carotid US 11/22: Bilateral ICA 40-59; left subclavian stenosis // Carotid US 04/28/2022: Bilateral ICA 40-59; left subclavian stenosis   Cerebral vascular disease    with prior TIA's; followed by Dr. Sandria Manly   Depression, neurotic 11/21/2013   Diabetes mellitus    on insulin   Enthesopathy of ankle and tarsus 09/04/2007   Overview:  Metatarsalgia    Enthesopathy of ankle and tarsus 09/04/2007   Overview:  Metatarsalgia  Formatting of this note might be different from the original. Metatarsalgia  10/1 IMO update   History of renal calculi    Hyperlipidemia    Hypertension    Ischemic heart disease    prior PCI to RCA in 1989. S/P PCI to LAD and OM in 1992. S/P PCI to first DX in 2000. S/P CABG x 3 in May 2011   Lambda light chain myeloma (HCC) 10/28/2021   Left hip pain 01/03/2020   OSA (obstructive sleep apnea) 01/11/2018   AHI 18.1 and SaO2 low 73%   PAD (peripheral artery disease) (HCC) 11/18/2017   ABIs/Arterial US 04/2019: R 1.21; L 0.66 // R SFA 30-49, stable > 50 CIA and EIA stenosis; L > 50 CIA stenosis (likely represents severe stenosis or short segment occlusion)   Peripheral neuropathy    Pneumonia 02/2011   Sacroiliac joint dysfunction of left side 01/03/2020   Stroke (HCC) 04/16/2001   small right cerebellar infarct on 04/16/2001 at that time he was found to have proximal left vertebral artery, proximal left common carotid  artery and both external carotid artery stenosis as well as intracranial stenosis involving mid basilar artery- 07/2011 add questionable TIA     Past Surgical History:  Procedure Laterality Date    NASAL ENDOSCOPY  01/11/2020   chronic rhinitis w/o evidence of acute sinusitis, bilateral inferior turbinate hypertrophy   ABDOMINAL AORTOGRAM W/LOWER EXTREMITY Bilateral 05/30/2019   Procedure: ABDOMINAL AORTOGRAM W/LOWER EXTREMITY;  Surgeon: Iran Ouch, MD;  Location: [facility];  Service: Cardiovascular;  Laterality: Bilateral;   ANGIOPLASTY  1989   right coronary artery   ANGIOPLASTY  1992   LAD and OM   ANGIOPLASTY  1998   First DX   BIOPSY  05/06/2023   Procedure: BIOPSY;  Surgeon: Jenel Lucks, MD;  Location: [facility];  Service: Gastroenterology;;   BRAIN SURGERY     on prior records   CORONARY ARTERY BYPASS GRAFT  11/12/2009   LIMA to LAD, SVG to OM and SVG to RCA   CORONARY STENT PLACEMENT  2000   Stent to LAD/Circumflex with angioplasty to first diagonal    ENTEROSCOPY N/A 05/06/2023   Procedure: ENTEROSCOPY;  Surgeon: Jenel Lucks, MD;  Location: [facility];  Service: Gastroenterology;  Laterality: N/A;   HOT HEMOSTASIS N/A 05/06/2023   Procedure: HOT HEMOSTASIS (ARGON PLASMA COAGULATION/BICAP);  Surgeon: Jenel Lucks, MD;  Location: [facility];  Service: Gastroenterology;  Laterality: N/A;  LEFT HEART CATH AND CORS/GRAFTS ANGIOGRAPHY N/A 10/13/2017   Procedure: LEFT HEART CATH AND CORS/GRAFTS ANGIOGRAPHY;  Surgeon: Yvonne Kendall, MD;  Location: [facility];  Service: Cardiovascular;  Laterality: N/A;   SUBMUCOSAL TATTOO INJECTION  05/06/2023   Procedure: SUBMUCOSAL TATTOO INJECTION;  Surgeon: Jenel Lucks, MD;  Location: [facility];  Service: Gastroenterology;;   Family History  Problem Relation Age of Onset   Depression Mother    Early death Mother    Kidney disease Mother    Ovarian cancer Mother     Hypertension Father    Heart disease Father    Heart attack Father    Asthma Brother    Diabetes Brother    Stroke Other        Uncle   Diabetes Sister    Colon cancer Neg Hx    Rectal cancer Neg Hx    Stomach cancer Neg Hx    Esophageal cancer Neg Hx    Social History   Tobacco Use   Smoking status: Former    Current packs/day: 0.00    Average packs/day: 0.3 packs/day for 54.0 years (13.5 ttl pk-yrs)    Types: Cigarettes    Start date: 06/29/1968    Quit date: 06/29/2022    Years since quitting: 0.9   Smokeless tobacco: Never  Vaping Use   Vaping status: Never Used  Substance Use Topics   Alcohol use: Never   Drug use: No   Current Outpatient Medications  Medication Sig Dispense Refill   acetaminophen (TYLENOL) 500 MG tablet Take 1,000 mg by mouth every 6 (six) hours as needed for mild pain (pain score 1-3) or moderate pain (pain score 4-6).     apixaban (ELIQUIS) 5 MG TABS tablet Take 1 tablet (5 mg total) by mouth 2 (two) times daily. Further refills from cardio 180 tablet 0   BD PEN NEEDLE NANO 2ND GEN 32G X 4 MM MISC Place 1 Needle onto the skin in the morning and at bedtime.     Bismuth 262 MG CHEW Chew 524 mg by mouth in the morning, at noon, in the evening, and at bedtime. 112 tablet 0   Carboxymethylcell-Hypromellose (GENTEAL OP) Place 1 drop into both eyes daily as needed (dry eyes).     dofetilide (TIKOSYN) 125 MCG capsule Take 1 capsule (125 mcg total) by mouth 2 (two) times daily. 180 capsule 2   doxycycline (VIBRAMYCIN) 100 MG capsule Take 1 capsule (100 mg total) by mouth 2 (two) times daily. 28 capsule 0   ezetimibe (ZETIA) 10 MG tablet Take 1 tablet (10 mg total) by mouth daily. 90 tablet 3   gabapentin (NEURONTIN) 300 MG capsule Take 1 capsule (300 mg total) by mouth 3 (three) times daily. 90 capsule 3   glucose blood (ONETOUCH VERIO) test strip USE UP TO FOUR TIMES DAILY AS DIRECTED. 100 strip 3   insulin glargine (LANTUS) 100 UNIT/ML Solostar Pen Inject 30-40  Units into the skin 2 (two) times daily. 90 mL 1   Lancets (ONETOUCH DELICA PLUS LANCET33G) MISC USE UP TO FOUR TIMES DAILY AS DIRECTED 100 each 11   loratadine (CLARITIN) 10 MG tablet Take 10 mg by mouth daily as needed for allergies.     metoprolol tartrate (LOPRESSOR) 25 MG tablet Take 0.5 tablets (12.5 mg total) by mouth 2 (two) times daily. 90 tablet 3   metroNIDAZOLE (FLAGYL) 250 MG tablet Take 1 tablet (250 mg total) by mouth 4 (four) times daily. 56 tablet 0  nitroGLYCERIN (NITROSTAT) 0.4 MG SL tablet Place 0.4 mg under the tongue every 5 (five) minutes as needed for chest pain.     olmesartan (BENICAR) 5 MG tablet Take 5 mg by mouth daily.     ondansetron (ZOFRAN) 8 MG tablet Take 1 tablet (8 mg total) by mouth every 8 (eight) hours as needed for nausea or vomiting. 30 tablet 3   pantoprazole (PROTONIX) 40 MG tablet Take 1 tablet (40 mg total) by mouth daily. 90 tablet 1   pantoprazole (PROTONIX) 40 MG tablet Take 1 tablet (40 mg total) by mouth 2 (two) times daily before a meal. 28 tablet 0   polyethylene glycol powder (GLYCOLAX/MIRALAX) 17 GM/SCOOP powder Take 1 Container by mouth daily.     rosuvastatin (CRESTOR) 20 MG tablet Take 1 tablet (20 mg total) by mouth at bedtime. 90 tablet 3   traZODone (DESYREL) 50 MG tablet Take 0.5-1 tablets (25-50 mg total) by mouth at bedtime as needed for sleep. 90 tablet 1   folic acid (FOLVITE) 1 MG tablet Take 2 tablets (2 mg total) by mouth in the morning and at bedtime. (Patient not taking: Reported on 05/27/2023) 120 tablet 5   No current facility-administered medications for this visit.   Allergies  Allergen Reactions   Codeine Nausea And Vomiting   Lisinopril Cough   Nsaids Other (See Comments)    CKD   Cymbalta [Duloxetine Hcl] Other (See Comments)    Halucinations   Dapagliflozin Rash   Latex Hives     Review of Systems: All systems reviewed and negative except where noted in HPI.    NM PET CT CARDIAC PERFUSION MULTI W/ABSOLUTE  BLOODFLOW  Result Date: 05/17/2023   Large, moderate, reveserible defect present in the basal to mid anterior/antoerolateral segments and mid septum consistent with ischemia. LVEF is mildly reduced 49% with increase to 50% with stress. No TID. MBF is not accurate in the setting of CABG, but severely reduced (1.16). High risk study due to large defect anterior distribution.   LV perfusion is abnormal. There is evidence of ischemia. There is no evidence of infarction. Defect 1: There is a large defect with moderate reduction in uptake present in the mid to basal anterior, anterolateral and anteroseptal location(s) that is reversible. There is abnormal wall motion in the defect area. Consistent with ischemia.   Rest left ventricular function is abnormal. Rest global function is mildly reduced. There were multiple regional abnormalities. Rest EF: 49%. Stress left ventricular function is normal. Stress EF: 50%. End diastolic cavity size is mildly enlarged.   Myocardial blood flow reserve is not reported in this patient due to technical or patient-specific concerns that affect accuracy.   Coronary calcium assessment not performed due to prior revascularization.   Findings are consistent with ischemia. The study is high risk. Electronically signed by Lennie Odor, MD ___________________________________________________________________________ ______________________________________ Francia Greaves: OVER-READ INTERPRETATION  CT CHEST The following report is a limited chest CT over-read performed by radiologist Dr. Irma Newness [facility] on 05/17/2023. This over-read does not include interpretation of cardiac or coronary anatomy or pathology nor does it include evaluation of the PET data. The cardiac PET-CT interpretation by the cardiologist is attached. COMPARISON:  Chest radiographs 03/16/2023. PET-CT 01/04/2023. Abdominal CT 10/28/2020. FINDINGS: Mediastinum/Nodes: No enlarged lymph nodes within the visualized  mediastinum.Aortic and coronary artery atherosclerosis post median sternotomy. Lungs/Pleura: No pleural effusion. No suspicious pulmonary findings. Stable right upper lobe air cyst in mild dependent atelectasis in both lungs. Upper abdomen: No significant  findings in the visualized upper abdomen. Unchanged 1.7 cm right adrenal adenoma, not hypermetabolic on PET-CT. No follow-up imaging recommended. Musculoskeletal/Chest wall: No chest wall mass or suspicious osseous findings within the visualized chest. Previous median sternotomy. IMPRESSION: No significant extracardiac findings within the visualized chest. Electronically Signed   By: Carey Bullocks M.D.   On: 05/17/2023 13:17   Physical Exam: BP 124/70   Pulse 62   Ht 5\' 8"  (1.727 m)   Wt 168 lb (76.2 kg)   BMI 25.54 kg/m  Constitutional: Pleasant,well-developed, ***male in no acute distress. HEENT: Normocephalic and atraumatic. Conjunctivae are normal. No scleral icterus. Neck supple.  Cardiovascular: Normal rate, regular rhythm.  Pulmonary/chest: Effort normal and breath sounds normal. No wheezing, rales or rhonchi. Abdominal: Soft, nondistended, nontender. Bowel sounds active throughout. There are no masses palpable. No hepatomegaly. Extremities: no edema Lymphadenopathy: No cervical adenopathy noted. Neurological: Alert and oriented to person place and time. Skin: Skin is warm and dry. No rashes noted. Psychiatric: Normal mood and affect. Behavior is normal.  CBC    Component Value Date/Time   WBC 3.9 (L) 05/21/2023 1217   RBC 3.79 (L) 05/21/2023 1217   HGB 9.9 (L) 05/21/2023 1217   HGB 6.3 (LL) 05/04/2023 0852   HGB 13.4 05/22/2019 0912   HCT 31.8 (L) 05/21/2023 1217   HCT 40.2 05/22/2019 0912   PLT 220 05/21/2023 1217   PLT 229 05/04/2023 0852   PLT 307 05/22/2019 0912   MCV 83.9 05/21/2023 1217   MCV 91 05/22/2019 0912   MCH 26.1 05/21/2023 1217   MCHC 31.1 05/21/2023 1217   RDW 15.9 (H) 05/21/2023 1217   RDW 13.3  05/22/2019 0912   LYMPHSABS 0.6 (L) 05/21/2023 1217   MONOABS 0.4 05/21/2023 1217   EOSABS 0.1 05/21/2023 1217   BASOSABS 0.0 05/21/2023 1217    CMP     Component Value Date/Time   NA 135 05/21/2023 1217   NA 140 08/31/2021 1026   K 3.6 05/21/2023 1217   CL 102 05/21/2023 1217   CO2 26 05/21/2023 1217   GLUCOSE 229 (H) 05/21/2023 1217   BUN 14 05/21/2023 1217   BUN 21 08/31/2021 1026   CREATININE 1.38 (H) 05/21/2023 1217   CREATININE 1.53 (H) 05/04/2023 0852   CALCIUM 9.2 05/21/2023 1217   PROT 6.6 05/21/2023 1217   PROT 6.0 10/01/2019 0944   ALBUMIN 4.0 05/21/2023 1217   ALBUMIN 4.2 10/01/2019 0944   AST 23 05/21/2023 1217   AST 25 05/04/2023 0852   ALT 22 05/21/2023 1217   ALT 24 05/04/2023 0852   ALKPHOS 51 05/21/2023 1217   BILITOT 0.3 05/21/2023 1217   BILITOT 0.5 05/04/2023 0852   GFRNONAA 52 (L) 05/21/2023 1217   GFRNONAA 46 (L) 05/04/2023 0852   GFRAA 49 (L) 03/18/2020 1018       Latest Ref Rng & Units 05/21/2023   12:17 PM 05/13/2023    8:59 AM 05/07/2023    5:28 AM  CBC EXTENDED  WBC 4.0 - 10.5 K/uL 3.9  4.8  7.0   RBC 4.22 - 5.81 MIL/uL 3.79  3.29  3.22   Hemoglobin 13.0 - 17.0 g/dL 9.9  8.7 Repeated and verified X2.  8.5   HCT 39.0 - 52.0 % 31.8  27.1  28.4   Platelets 150 - 400 K/uL 220  148.0  200   NEUT# 1.7 - 7.7 K/uL 2.7  4.0  5.2   Lymph# 0.7 - 4.0 K/uL 0.6  0.4  1.0  ASSESSMENT AND PLAN:  Marvin Salinas A, DO

## 2023-05-31 ENCOUNTER — Ambulatory Visit: Payer: 59 | Attending: Cardiovascular Disease | Admitting: Cardiovascular Disease

## 2023-05-31 ENCOUNTER — Encounter: Payer: Self-pay | Admitting: Cardiovascular Disease

## 2023-05-31 VITALS — BP 120/54 | HR 69 | Ht 68.0 in | Wt 162.2 lb

## 2023-05-31 DIAGNOSIS — I4892 Unspecified atrial flutter: Secondary | ICD-10-CM

## 2023-05-31 DIAGNOSIS — I25118 Atherosclerotic heart disease of native coronary artery with other forms of angina pectoris: Secondary | ICD-10-CM | POA: Diagnosis not present

## 2023-05-31 DIAGNOSIS — I4819 Other persistent atrial fibrillation: Secondary | ICD-10-CM | POA: Diagnosis not present

## 2023-05-31 DIAGNOSIS — I739 Peripheral vascular disease, unspecified: Secondary | ICD-10-CM

## 2023-05-31 DIAGNOSIS — I6523 Occlusion and stenosis of bilateral carotid arteries: Secondary | ICD-10-CM | POA: Diagnosis not present

## 2023-05-31 DIAGNOSIS — E785 Hyperlipidemia, unspecified: Secondary | ICD-10-CM

## 2023-05-31 NOTE — Patient Instructions (Signed)
Medication Instructions:  No changes *If you need a refill on your cardiac medications before your next appointment, please call your pharmacy*   Lab Work: None ordered If you have labs (blood work) drawn today and your tests are completely normal, you will receive your results only by: MyChart Message (if you have MyChart) OR A paper copy in the mail If you have any lab test that is abnormal or we need to change your treatment, we will call you to review the results.   Testing/Procedures: None ordered   Follow-Up: At [facility], you and your health needs are our priority.  As part of our continuing mission to provide you with exceptional heart care, we have created designated Provider Care Teams.  These Care Teams include your primary Cardiologist (physician) and Advanced Practice Providers (APPs -  Physician Assistants and Nurse Practitioners) who all work together to provide you with the care you need, when you need it.  We recommend signing up for the patient portal called "MyChart".  Sign up information is provided on this After Visit Summary.  MyChart is used to connect with patients for Virtual Visits (Telemedicine).  Patients are able to view lab/test results, encounter notes, upcoming appointments, etc.  Non-urgent messages can be sent to your provider as well.   To learn more about what you can do with MyChart, go to [email or web address].    Your next appointment:   4 month(s)  Provider:   Dr. Kirke Corin

## 2023-05-31 NOTE — Progress Notes (Signed)
Cardiology Office Note   Date:  05/31/2023   ID:  Marvin Salinas, DOB 1946/05/22, MRN [id number]  PCP:  Natalia Leatherwood, DO  Cardiologist:  Dr. Elease Hashimoto  No chief complaint on file.      History of Present Illness: Marvin Salinas is a 77 y.o. male who is here today for a follow-up visit regarding peripheral arterial disease.  He has known history of coronary artery disease status post CABG in 2001, paroxysmal atrial fibrillation on anticoagulation, essential hypertension, carotid disease, hyperlipidemia, diabetes and prior TIAs. Patient has known history of peripheral arterial disease .  He does have underlying chronic kidney disease with creatinine between 1.6-2.  The patient is followed for peripheral arterial disease with previous severe claudication.  Angiography in December 2020 showed heavily calcified subtotal occlusion of the left common iliac artery with moderate disease affecting the right common iliac artery.  I performed successful angioplasty and and balloon expandable stent placement to the left common iliac artery.   He has known history of low back pain and bilateral knee joint pain.    He has known severe lumbar radiculopathy.   He is currently being treated for multiple myeloma with chronic kidney disease.    Most recent lower extremity arterial Doppler studies in July showed noncompressible vessels with patent left iliac stent with stable moderately elevated velocities in the right iliac artery.   He quit smoking in January of 2023.  He has known history of vertigo and severe neuropathy.  He was hospitalized last month with lower GI bleed with a hemoglobin of 6.1.  He improved with transfusion.  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  His Eliquis was resumed after 48 hours.  We elected not to resume aspirin.  Before his hospitalization when he was seen in our office he complained of chest pain worrisome for atypical angina.  He underwent a cardiac  PET scan on November 26 which showed evidence of anterior and anterolateral ischemia with an EF of 50%.  Since his anemia was treated, he reports significant improvement in chest pain.   Past Medical History:  Diagnosis Date   Acute kidney injury superimposed on chronic kidney disease (HCC) 10/12/2017   Atrial fibrillation with RVR (HCC) 10/27/2017   Back pain    Basilar artery stenosis    on chronic Plavix   Benign prostatic hypertrophy without urinary obstruction 04/17/2014   Carotid artery disease (HCC) 10/06/2010   Carotid US 04/2019: Bilat ICA 40-59; L subclavian stenosis  // Carotid US 11/21: Bilat ICA 40-59; bilateral subclavian stenosis // Carotid US 11/22: Bilateral ICA 40-59; left subclavian stenosis // Carotid US 04/28/2022: Bilateral ICA 40-59; left subclavian stenosis   Cerebral vascular disease    with prior TIA's; followed by Dr. Sandria Manly   Depression, neurotic 11/21/2013   Diabetes mellitus    on insulin   Enthesopathy of ankle and tarsus 09/04/2007   Overview:  Metatarsalgia    Enthesopathy of ankle and tarsus 09/04/2007   Overview:  Metatarsalgia  Formatting of this note might be different from the original. Metatarsalgia  10/1 IMO update   History of renal calculi    Hyperlipidemia    Hypertension    Ischemic heart disease    prior PCI to RCA in 1989. S/P PCI to LAD and OM in 1992. S/P PCI to first DX in 2000. S/P CABG x 3 in May 2011   Lambda light chain myeloma (HCC) 10/28/2021   Left hip pain 01/03/2020  OSA (obstructive sleep apnea) 01/11/2018   AHI 18.1 and SaO2 low 73%   PAD (peripheral artery disease) (HCC) 11/18/2017   ABIs/Arterial US 04/2019: R 1.21; L 0.66 // R SFA 30-49, stable > 50 CIA and EIA stenosis; L > 50 CIA stenosis (likely represents severe stenosis or short segment occlusion)   Peripheral neuropathy    Pneumonia 02/2011   Sacroiliac joint dysfunction of left side 01/03/2020   Stroke (HCC) 04/16/2001   small right cerebellar infarct on  04/16/2001 at that time he was found to have proximal left vertebral artery, proximal left common carotid artery and both external carotid artery stenosis as well as intracranial stenosis involving mid basilar artery- 07/2011 add questionable TIA    Past Surgical History:  Procedure Laterality Date    NASAL ENDOSCOPY  01/11/2020   chronic rhinitis w/o evidence of acute sinusitis, bilateral inferior turbinate hypertrophy   ABDOMINAL AORTOGRAM W/LOWER EXTREMITY Bilateral 05/30/2019   Procedure: ABDOMINAL AORTOGRAM W/LOWER EXTREMITY;  Surgeon: Iran Ouch, MD;  Location: [facility];  Service: Cardiovascular;  Laterality: Bilateral;   ANGIOPLASTY  1989   right coronary artery   ANGIOPLASTY  1992   LAD and OM   ANGIOPLASTY  1998   First DX   BIOPSY  05/06/2023   Procedure: BIOPSY;  Surgeon: Jenel Lucks, MD;  Location: [facility];  Service: Gastroenterology;;   BRAIN SURGERY     on prior records   CORONARY ARTERY BYPASS GRAFT  11/12/2009   LIMA to LAD, SVG to OM and SVG to RCA   CORONARY STENT PLACEMENT  2000   Stent to LAD/Circumflex with angioplasty to first diagonal    ENTEROSCOPY N/A 05/06/2023   Procedure: ENTEROSCOPY;  Surgeon: Jenel Lucks, MD;  Location: [facility];  Service: Gastroenterology;  Laterality: N/A;   HOT HEMOSTASIS N/A 05/06/2023   Procedure: HOT HEMOSTASIS (ARGON PLASMA COAGULATION/BICAP);  Surgeon: Jenel Lucks, MD;  Location: [facility];  Service: Gastroenterology;  Laterality: N/A;   LEFT HEART CATH AND CORS/GRAFTS ANGIOGRAPHY N/A 10/13/2017   Procedure: LEFT HEART CATH AND CORS/GRAFTS ANGIOGRAPHY;  Surgeon: Yvonne Kendall, MD;  Location: [facility];  Service: Cardiovascular;  Laterality: N/A;   SUBMUCOSAL TATTOO INJECTION  05/06/2023   Procedure: SUBMUCOSAL TATTOO INJECTION;  Surgeon: Jenel Lucks, MD;  Location: [facility];  Service: Gastroenterology;;     Current Outpatient Medications  Medication Sig  Dispense Refill   acetaminophen (TYLENOL) 500 MG tablet Take 1,000 mg by mouth every 6 (six) hours as needed for mild pain (pain score 1-3) or moderate pain (pain score 4-6).     apixaban (ELIQUIS) 5 MG TABS tablet Take 1 tablet (5 mg total) by mouth 2 (two) times daily. Further refills from cardio 180 tablet 0   BD PEN NEEDLE NANO 2ND GEN 32G X 4 MM MISC Place 1 Needle onto the skin in the morning and at bedtime.     Bismuth 262 MG CHEW Chew 524 mg by mouth in the morning, at noon, in the evening, and at bedtime. 112 tablet 0   Carboxymethylcell-Hypromellose (GENTEAL OP) Place 1 drop into both eyes daily as needed (dry eyes).     dofetilide (TIKOSYN) 125 MCG capsule Take 1 capsule (125 mcg total) by mouth 2 (two) times daily. 180 capsule 2   ezetimibe (ZETIA) 10 MG tablet Take 1 tablet (10 mg total) by mouth daily. 90 tablet 3   folic acid (FOLVITE) 1 MG tablet Take 2 tablets (2 mg total) by  mouth in the morning and at bedtime. 120 tablet 5   gabapentin (NEURONTIN) 300 MG capsule Take 1 capsule (300 mg total) by mouth 3 (three) times daily. 90 capsule 3   glucose blood (ONETOUCH VERIO) test strip USE UP TO FOUR TIMES DAILY AS DIRECTED. 100 strip 3   insulin glargine (LANTUS) 100 UNIT/ML Solostar Pen Inject 30-40 Units into the skin 2 (two) times daily. 90 mL 1   Lancets (ONETOUCH DELICA PLUS LANCET33G) MISC USE UP TO FOUR TIMES DAILY AS DIRECTED 100 each 11   loratadine (CLARITIN) 10 MG tablet Take 10 mg by mouth daily as needed for allergies.     metoprolol tartrate (LOPRESSOR) 25 MG tablet Take 0.5 tablets (12.5 mg total) by mouth 2 (two) times daily. 90 tablet 3   nitroGLYCERIN (NITROSTAT) 0.4 MG SL tablet Place 0.4 mg under the tongue every 5 (five) minutes as needed for chest pain.     olmesartan (BENICAR) 5 MG tablet Take 5 mg by mouth daily.     ondansetron (ZOFRAN) 8 MG tablet Take 1 tablet (8 mg total) by mouth every 8 (eight) hours as needed for nausea or vomiting. 30 tablet 3    pantoprazole (PROTONIX) 40 MG tablet Take 1 tablet (40 mg total) by mouth daily. 90 tablet 1   pantoprazole (PROTONIX) 40 MG tablet Take 1 tablet (40 mg total) by mouth 2 (two) times daily before a meal. 28 tablet 0   polyethylene glycol powder (GLYCOLAX/MIRALAX) 17 GM/SCOOP powder Take 1 Container by mouth daily.     rosuvastatin (CRESTOR) 20 MG tablet Take 1 tablet (20 mg total) by mouth at bedtime. 90 tablet 3   traZODone (DESYREL) 50 MG tablet Take 0.5-1 tablets (25-50 mg total) by mouth at bedtime as needed for sleep. 90 tablet 1   doxycycline (VIBRAMYCIN) 100 MG capsule Take 1 capsule (100 mg total) by mouth 2 (two) times daily. (Patient not taking: Reported on 05/31/2023) 28 capsule 0   metroNIDAZOLE (FLAGYL) 250 MG tablet Take 1 tablet (250 mg total) by mouth 4 (four) times daily. (Patient not taking: Reported on 05/31/2023) 56 tablet 0   No current facility-administered medications for this visit.    Allergies:   Codeine, Lisinopril, Nsaids, Cymbalta [duloxetine hcl], Dapagliflozin, and Latex    Social History:  The patient  reports that he quit smoking about 11 months ago. His smoking use included cigarettes. He started smoking about 54 years ago. He has a 13.5 pack-year smoking history. He has never used smokeless tobacco. He reports that he does not drink alcohol and does not use drugs.   Family History:  The patient's family history includes Asthma in his brother; Depression in his mother; Diabetes in his brother and sister; Early death in his mother; Heart attack in his father; Heart disease in his father; Hypertension in his father; Kidney disease in his mother; Ovarian cancer in his mother; Stroke in an other family member.    ROS:  Please see the history of present illness.   Otherwise, review of systems are positive for none.   All other systems are reviewed and negative.    PHYSICAL EXAM: VS:  BP (!) 120/54 (BP Location: Left Arm, Patient Position: Sitting, Cuff Size:  Normal)   Pulse 69   Ht 5\' 8"  (1.727 m)   Wt 162 lb 3.2 oz (73.6 kg)   SpO2 98%   BMI 24.66 kg/m  , BMI Body mass index is 24.66 kg/m. GEN: Well nourished, well developed, in no  acute distress  HEENT: normal  Neck: no JVD,  or masses .  Bilateral carotid bruits. Cardiac: RRR; no murmurs, rubs, or gallops,no edema  Respiratory:  clear to auscultation bilaterally, normal work of breathing GI: soft, nontender, nondistended, + BS MS: no deformity or atrophy  Skin: warm and dry, no rash Neuro:  Strength and sensation are intact Psych: euthymic mood, full affect  Vascular: Distal pulses are not palpable.  EKG:  EKG is ordered today. EKG showed:  Sinus rhythm with marked sinus arrhythmia Left axis deviation   Recent Labs: 03/18/2023: Magnesium 2.0 05/21/2023: ALT 22; BUN 14; Creatinine, Ser 1.38; Hemoglobin 9.9; Platelets 220; Potassium 3.6; Sodium 135    Lipid Panel    Component Value Date/Time   CHOL 123 03/17/2023 0130   CHOL 120 08/27/2022 1429   TRIG 172 (H) 03/17/2023 0130   HDL 44 03/17/2023 0130   HDL 60 08/27/2022 1429   CHOLHDL 2.8 03/17/2023 0130   VLDL 34 03/17/2023 0130   LDLCALC 45 03/17/2023 0130   LDLCALC 41 08/27/2022 1429      Wt Readings from Last 3 Encounters:  05/31/23 162 lb 3.2 oz (73.6 kg)  05/27/23 168 lb (76.2 kg)  05/21/23 163 lb 12.8 oz (74.3 kg)           11/30/2016    7:59 AM  PAD Screen  Previous PAD dx? Yes  Previous surgical procedure? No  Pain with walking? Yes  Subsides with rest? No  Feet/toe relief with dangling? Yes  Painful, non-healing ulcers? No  Extremities discolored? No      ASSESSMENT AND PLAN:  1.  Peripheral arterial disease: Status post  stent placement to the left common iliac artery for subtotal occlusion.   Most recent Doppler showed patent stent.  Continue medical therapy and repeat studies in July of next year.  He does report left calf claudication but he is also limited by neuropathy  symptoms.  2. Coronary artery disease with other forms of angina: Abnormal cardiac PET scan.  However, he reports improvement in chest pain since his anemia was treated.  Given recent GI bleed and also chronic kidney disease related to multiple myeloma, I favor continued medical therapy for now in the setting of improved symptoms.  No aspirin given that he is on anticoagulation with Eliquis and recent GI bleed.   3. Tobacco use: He quit smoking in January.  4. Carotid disease: Most recent carotid Doppler showed stable moderate bilateral disease.    5. Hyperlipidemia: Most recent Doppler showed an LDL of 41.  Continue rosuvastatin and ezetimibe.  6.  Persistent atrial fibrillation: Maintaining in sinus rhythm with dofetilide.  On long-term anticoagulation with Eliquis.   Disposition:   FU with me in 4 month.   Signed,  Lorine Bears, MD  05/31/2023 8:33 AM    [facility]

## 2023-06-03 ENCOUNTER — Inpatient Hospital Stay: Payer: 59 | Attending: Family

## 2023-06-03 ENCOUNTER — Inpatient Hospital Stay: Payer: 59

## 2023-06-03 ENCOUNTER — Encounter: Payer: Self-pay | Admitting: Hematology & Oncology

## 2023-06-03 ENCOUNTER — Inpatient Hospital Stay (HOSPITAL_BASED_OUTPATIENT_CLINIC_OR_DEPARTMENT_OTHER): Payer: 59 | Admitting: Hematology & Oncology

## 2023-06-03 VITALS — BP 119/39 | HR 53 | Temp 98.0°F | Resp 18 | Ht 68.0 in | Wt 162.8 lb

## 2023-06-03 DIAGNOSIS — N183 Chronic kidney disease, stage 3 unspecified: Secondary | ICD-10-CM | POA: Diagnosis not present

## 2023-06-03 DIAGNOSIS — D631 Anemia in chronic kidney disease: Secondary | ICD-10-CM

## 2023-06-03 DIAGNOSIS — D500 Iron deficiency anemia secondary to blood loss (chronic): Secondary | ICD-10-CM | POA: Diagnosis not present

## 2023-06-03 DIAGNOSIS — C9000 Multiple myeloma not having achieved remission: Secondary | ICD-10-CM | POA: Diagnosis not present

## 2023-06-03 DIAGNOSIS — N1831 Chronic kidney disease, stage 3a: Secondary | ICD-10-CM | POA: Insufficient documentation

## 2023-06-03 DIAGNOSIS — D649 Anemia, unspecified: Secondary | ICD-10-CM

## 2023-06-03 LAB — CBC WITH DIFFERENTIAL (CANCER CENTER ONLY)
Abs Immature Granulocytes: 0.02 10*3/uL (ref 0.00–0.07)
Basophils Absolute: 0 10*3/uL (ref 0.0–0.1)
Basophils Relative: 0 %
Eosinophils Absolute: 0.1 10*3/uL (ref 0.0–0.5)
Eosinophils Relative: 1 %
HCT: 32.6 % — ABNORMAL LOW (ref 39.0–52.0)
Hemoglobin: 9.9 g/dL — ABNORMAL LOW (ref 13.0–17.0)
Immature Granulocytes: 0 %
Lymphocytes Relative: 19 %
Lymphs Abs: 1.3 10*3/uL (ref 0.7–4.0)
MCH: 24.3 pg — ABNORMAL LOW (ref 26.0–34.0)
MCHC: 30.4 g/dL (ref 30.0–36.0)
MCV: 80.1 fL (ref 80.0–100.0)
Monocytes Absolute: 0.5 10*3/uL (ref 0.1–1.0)
Monocytes Relative: 8 %
Neutro Abs: 4.8 10*3/uL (ref 1.7–7.7)
Neutrophils Relative %: 72 %
Platelet Count: 226 10*3/uL (ref 150–400)
RBC: 4.07 MIL/uL — ABNORMAL LOW (ref 4.22–5.81)
RDW: 15.4 % (ref 11.5–15.5)
WBC Count: 6.8 10*3/uL (ref 4.0–10.5)
nRBC: 0 % (ref 0.0–0.2)

## 2023-06-03 LAB — CMP (CANCER CENTER ONLY)
ALT: 12 U/L (ref 0–44)
AST: 17 U/L (ref 15–41)
Albumin: 4.3 g/dL (ref 3.5–5.0)
Alkaline Phosphatase: 56 U/L (ref 38–126)
Anion gap: 8 (ref 5–15)
BUN: 19 mg/dL (ref 8–23)
CO2: 28 mmol/L (ref 22–32)
Calcium: 9.3 mg/dL (ref 8.9–10.3)
Chloride: 104 mmol/L (ref 98–111)
Creatinine: 1.62 mg/dL — ABNORMAL HIGH (ref 0.61–1.24)
GFR, Estimated: 43 mL/min — ABNORMAL LOW (ref >60)
Glucose, Bld: 225 mg/dL — ABNORMAL HIGH (ref 70–99)
Potassium: 4.2 mmol/L (ref 3.5–5.1)
Sodium: 140 mmol/L (ref 135–145)
Total Bilirubin: 0.3 mg/dL (ref <1.2)
Total Protein: 6.3 g/dL — ABNORMAL LOW (ref 6.5–8.1)

## 2023-06-03 LAB — LACTATE DEHYDROGENASE: LDH: 199 U/L — ABNORMAL HIGH (ref 98–192)

## 2023-06-03 LAB — IRON AND IRON BINDING CAPACITY (CC-WL,HP ONLY)
Iron: 31 ug/dL — ABNORMAL LOW (ref 45–182)
Saturation Ratios: 6 % — ABNORMAL LOW (ref 17.9–39.5)
TIBC: 547 ug/dL — ABNORMAL HIGH (ref 250–450)
UIBC: 516 ug/dL — ABNORMAL HIGH (ref 117–376)

## 2023-06-03 LAB — ABO/RH: ABO/RH(D): O POS

## 2023-06-03 LAB — FERRITIN: Ferritin: 8 ng/mL — ABNORMAL LOW (ref 24–336)

## 2023-06-03 MED ORDER — DARBEPOETIN ALFA 300 MCG/0.6ML IJ SOSY
300.0000 ug | PREFILLED_SYRINGE | Freq: Once | INTRAMUSCULAR | Status: AC
Start: 2023-06-03 — End: 2023-06-03
  Administered 2023-06-03: 300 ug via SUBCUTANEOUS
  Filled 2023-06-03: qty 0.6

## 2023-06-03 NOTE — Progress Notes (Signed)
Hematology and Oncology Follow Up Visit  Marvin Salinas [id number] 1946-01-11 77 y.o. 06/03/2023   Principle Diagnosis:  Lambda light chain myeloma --normal cytogenetics Anemia of renal sufficiency   Current Therapy:        Faspro/Velcade/Revlimid -- started on 11/05/2021 - monthly -- revlimid stopped in 09/2022.  All therapy stopped on 02/11/2023 Aranesp 300 mcg subcu for hemoglobin less than 11               Interim History:  Marvin Salinas is here today for follow-up.  He is feeling better.  The last on that we saw him, we had to have admitted because a hemoglobin was down to 6.3.  He is having GI bleeding.  He was on Eliquis.  He had endoscopy which showed I think a ulcer in the stomach.  He feels better.  His hemoglobin is 9.9.  However, I think that he is going to probably need ESA to try to get his hemoglobin a little bit higher.  As far as the myeloma, he has done quite well off treatment.  He has had no treatment now for 4 months.  His last lambda light chain was 1.4 mg/dL.  His last iron studies in November showed an iron saturation of 25%.  He had a nice Thanksgiving.  He has had no fever.  There has been no cough.  He has had no nausea or vomiting.  He has had no leg swelling.  He  does have back pain.  He has herniated disc in the lower back.  He has some left knee pain.  Overall, I would say that his performance status is probably ECOG 2.  Medications:  Allergies as of 06/03/2023       Reactions   Cymbalta [duloxetine Hcl] Other (See Comments)   Halucinations   Codeine Nausea And Vomiting   Lisinopril Cough   Nsaids Other (See Comments)   CKD   Dapagliflozin Rash   Latex Hives        Medication List        Accurate as of June 03, 2023  1:52 PM. If you have any questions, ask your nurse or doctor.          STOP taking these medications    doxycycline 100 MG capsule Commonly known as: VIBRAMYCIN Stopped by: Josph Macho   folic acid 1 MG  tablet Commonly known as: FOLVITE Stopped by: Josph Macho   metroNIDAZOLE 250 MG tablet Commonly known as: FLAGYL Stopped by: Josph Macho       TAKE these medications    acetaminophen 500 MG tablet Commonly known as: TYLENOL Take 1,000 mg by mouth every 6 (six) hours as needed for mild pain (pain score 1-3) or moderate pain (pain score 4-6).   apixaban 5 MG Tabs tablet Commonly known as: Eliquis Take 1 tablet (5 mg total) by mouth 2 (two) times daily. Further refills from cardio   BD Pen Needle Nano 2nd Gen 32G X 4 MM Misc Generic drug: Insulin Pen Needle Place 1 Needle onto the skin in the morning and at bedtime.   Bismuth 262 MG Chew Chew 524 mg by mouth in the morning, at noon, in the evening, and at bedtime.   dofetilide 125 MCG capsule Commonly known as: TIKOSYN Take 1 capsule (125 mcg total) by mouth 2 (two) times daily.   ezetimibe 10 MG tablet Commonly known as: ZETIA Take 1 tablet (10 mg total) by mouth daily.   gabapentin  300 MG capsule Commonly known as: NEURONTIN Take 1 capsule (300 mg total) by mouth 3 (three) times daily.   GENTEAL OP Place 1 drop into both eyes daily as needed (dry eyes).   insulin glargine 100 UNIT/ML Solostar Pen Commonly known as: LANTUS Inject 30-40 Units into the skin 2 (two) times daily.   loratadine 10 MG tablet Commonly known as: CLARITIN Take 10 mg by mouth daily as needed for allergies.   metoprolol tartrate 25 MG tablet Commonly known as: LOPRESSOR Take 0.5 tablets (12.5 mg total) by mouth 2 (two) times daily.   nitroGLYCERIN 0.4 MG SL tablet Commonly known as: NITROSTAT Place 0.4 mg under the tongue every 5 (five) minutes as needed for chest pain.   olmesartan 5 MG tablet Commonly known as: BENICAR Take 5 mg by mouth daily.   ondansetron 8 MG tablet Commonly known as: ZOFRAN Take 1 tablet (8 mg total) by mouth every 8 (eight) hours as needed for nausea or vomiting.   OneTouch Delica Plus Lancet33G  Misc USE UP TO FOUR TIMES DAILY AS DIRECTED   OneTouch Verio test strip Generic drug: glucose blood USE UP TO FOUR TIMES DAILY AS DIRECTED.   pantoprazole 40 MG tablet Commonly known as: PROTONIX Take 1 tablet (40 mg total) by mouth daily.   polyethylene glycol powder 17 GM/SCOOP powder Commonly known as: GLYCOLAX/MIRALAX Take 1 Container by mouth daily.   rosuvastatin 20 MG tablet Commonly known as: CRESTOR Take 1 tablet (20 mg total) by mouth at bedtime.   traZODone 50 MG tablet Commonly known as: DESYREL Take 0.5-1 tablets (25-50 mg total) by mouth at bedtime as needed for sleep.        Allergies:  Allergies  Allergen Reactions   Cymbalta [Duloxetine Hcl] Other (See Comments)    Halucinations   Codeine Nausea And Vomiting   Lisinopril Cough   Nsaids Other (See Comments)    CKD   Dapagliflozin Rash   Latex Hives    Past Medical History, Surgical history, Social history, and Family History were reviewed and updated.  Review of Systems: Review of Systems  Constitutional: Negative.   HENT: Negative.    Eyes: Negative.   Respiratory: Negative.    Cardiovascular: Negative.   Gastrointestinal: Negative.   Genitourinary: Negative.   Musculoskeletal:  Positive for joint pain and myalgias.  Skin: Negative.   Neurological: Negative.   Endo/Heme/Allergies: Negative.   Psychiatric/Behavioral: Negative.       Physical Exam:  height is 5\' 8"  (1.727 m) and weight is 162 lb 12.8 oz (73.8 kg). His oral temperature is 98 F (36.7 C). His blood pressure is 119/39 (abnormal) and his pulse is 53 (abnormal). His respiration is 18 and oxygen saturation is 100%.   Wt Readings from Last 3 Encounters:  06/03/23 162 lb 12.8 oz (73.8 kg)  05/31/23 162 lb 3.2 oz (73.6 kg)  05/27/23 168 lb (76.2 kg)   Physical Exam Vitals reviewed.  HENT:     Head: Normocephalic and atraumatic.  Eyes:     Pupils: Pupils are equal, round, and reactive to light.  Cardiovascular:     Rate  and Rhythm: Normal rate and regular rhythm.     Heart sounds: Normal heart sounds.  Pulmonary:     Effort: Pulmonary effort is normal.     Breath sounds: Normal breath sounds.  Abdominal:     General: Bowel sounds are normal.     Palpations: Abdomen is soft.  Musculoskeletal:  General: No tenderness or deformity. Normal range of motion.     Cervical back: Normal range of motion.  Lymphadenopathy:     Cervical: No cervical adenopathy.  Skin:    General: Skin is warm and dry.     Findings: No erythema or rash.  Neurological:     Mental Status: He is alert and oriented to person, place, and time.  Psychiatric:        Behavior: Behavior normal.        Thought Content: Thought content normal.        Judgment: Judgment normal.      Lab Results  Component Value Date   WBC 6.8 06/03/2023   HGB 9.9 (L) 06/03/2023   HCT 32.6 (L) 06/03/2023   MCV 80.1 06/03/2023   PLT 226 06/03/2023   Lab Results  Component Value Date   FERRITIN 45 10/28/2021   IRON 127 05/05/2023   TIBC 515 (H) 05/05/2023   UIBC 388 05/05/2023   IRONPCTSAT 25 05/05/2023   Lab Results  Component Value Date   RETICCTPCT 1.5 02/04/2021   RBC 4.07 (L) 06/03/2023   Lab Results  Component Value Date   KPAFRELGTCHN 14.8 05/04/2023   LAMBDASER 13.7 05/04/2023   KAPLAMBRATIO 4.56 05/04/2023   Lab Results  Component Value Date   IGGSERUM 178 (L) 05/04/2023   IGGSERUM 200 (L) 05/04/2023   IGA 20 (L) 05/04/2023   IGA 23 (L) 05/04/2023   IGMSERUM 6 (L) 05/04/2023   IGMSERUM 6 (L) 05/04/2023   Lab Results  Component Value Date   TOTALPROTELP 5.7 (L) 05/04/2023   ALBUMINELP 3.7 05/04/2023   A1GS 0.3 05/04/2023   A2GS 0.6 05/04/2023   BETS 0.9 05/04/2023   GAMS 0.2 (L) 05/04/2023   MSPIKE Not Observed 05/04/2023   SPEI Comment 01/14/2023     Chemistry      Component Value Date/Time   NA 140 06/03/2023 1245   NA 140 08/31/2021 1026   K 4.2 06/03/2023 1245   CL 104 06/03/2023 1245   CO2 28  06/03/2023 1245   BUN 19 06/03/2023 1245   BUN 21 08/31/2021 1026   CREATININE 1.62 (H) 06/03/2023 1245      Component Value Date/Time   CALCIUM 9.3 06/03/2023 1245   ALKPHOS 56 06/03/2023 1245   AST 17 06/03/2023 1245   ALT 12 06/03/2023 1245   BILITOT 0.3 06/03/2023 1245       Impression and Plan: Marvin Salinas is a pleasant 77 yo caucasian gentleman with lambda light chains myeloma with mild renal insufficiency.  I am glad to see that he is feeling better.  I am even more happy that he is off treatment now for 4 months.  I think we are to have to try him on a ESA.  I really think that this is the best way for [place] to try to get his blood to where it needs to be.  He is on Eliquis.  I think he is on baby aspirin.  I would like to get him back to see [place] in another 6 weeks.    Josph Macho, MD 12/13/20241:52 PM

## 2023-06-03 NOTE — Patient Instructions (Signed)

## 2023-06-05 LAB — IGG, IGA, IGM
IgA: 33 mg/dL — ABNORMAL LOW (ref 61–437)
IgG (Immunoglobin G), Serum: 285 mg/dL — ABNORMAL LOW (ref 603–1613)
IgM (Immunoglobulin M), Srm: 15 mg/dL (ref 15–143)

## 2023-06-06 ENCOUNTER — Telehealth: Payer: Self-pay

## 2023-06-06 ENCOUNTER — Other Ambulatory Visit: Payer: Self-pay | Admitting: Medical Oncology

## 2023-06-06 LAB — KAPPA/LAMBDA LIGHT CHAINS
Kappa free light chain: 15.2 mg/L (ref 3.3–19.4)
Kappa, lambda light chain ratio: 1.02 (ref 0.26–1.65)
Lambda free light chains: 14.9 mg/L (ref 5.7–26.3)

## 2023-06-06 NOTE — Telephone Encounter (Signed)
-----   Message from Josph Macho sent at 06/03/2023  4:00 PM EST ----- Call and let him know that the iron is very low.  Please set him up with a dose of IV iron.  Thanks.  Cindee Lame

## 2023-06-08 ENCOUNTER — Inpatient Hospital Stay: Payer: 59 | Admitting: Dietician

## 2023-06-08 ENCOUNTER — Telehealth: Payer: Self-pay | Admitting: Dietician

## 2023-06-08 DIAGNOSIS — H53462 Homonymous bilateral field defects, left side: Secondary | ICD-10-CM | POA: Diagnosis not present

## 2023-06-08 DIAGNOSIS — I639 Cerebral infarction, unspecified: Secondary | ICD-10-CM | POA: Diagnosis not present

## 2023-06-08 DIAGNOSIS — H25813 Combined forms of age-related cataract, bilateral: Secondary | ICD-10-CM | POA: Diagnosis not present

## 2023-06-08 LAB — HM DIABETES EYE EXAM

## 2023-06-08 NOTE — Telephone Encounter (Signed)
Attempted to reach patient for a scheduled remote nutrition consult. Provided my cell# on voice mail to return call for his follow up nutrition consult.  Cyndi Pessy Delamar, RDN, LDN Registered Dietitian, [facility] Part Time Remote (Usual office hours: Tuesday-Thursday) Cell: [phone]   

## 2023-06-17 ENCOUNTER — Inpatient Hospital Stay: Payer: 59

## 2023-06-17 VITALS — BP 122/54 | HR 57 | Temp 98.2°F | Resp 18

## 2023-06-17 DIAGNOSIS — D631 Anemia in chronic kidney disease: Secondary | ICD-10-CM | POA: Diagnosis not present

## 2023-06-17 DIAGNOSIS — D500 Iron deficiency anemia secondary to blood loss (chronic): Secondary | ICD-10-CM

## 2023-06-17 DIAGNOSIS — N1831 Chronic kidney disease, stage 3a: Secondary | ICD-10-CM | POA: Diagnosis not present

## 2023-06-17 MED ORDER — ACETAMINOPHEN 325 MG PO TABS
650.0000 mg | ORAL_TABLET | Freq: Once | ORAL | Status: AC
  Administered 2023-06-17: 650 mg via ORAL
  Filled 2023-06-17: qty 2

## 2023-06-17 MED ORDER — IRON SUCROSE 20 MG/ML IV SOLN: 200.0000 mg | Freq: Once | INTRAVENOUS | Status: DC

## 2023-06-17 MED ORDER — SODIUM CHLORIDE 0.9 % IV SOLN: Freq: Once | INTRAVENOUS | Status: AC

## 2023-06-17 MED ORDER — DIPHENHYDRAMINE HCL 25 MG PO CAPS
25.0000 mg | ORAL_CAPSULE | Freq: Once | ORAL | Status: AC
  Administered 2023-06-17: 25 mg via ORAL
  Filled 2023-06-17: qty 1

## 2023-06-17 MED ORDER — IRON SUCROSE 20 MG/ML IV SOLN
200.0000 mg | Freq: Once | INTRAVENOUS | Status: AC
Start: 2023-06-17 — End: 2023-06-17
  Administered 2023-06-17: 200 mg via INTRAVENOUS

## 2023-06-17 NOTE — Patient Instructions (Signed)
Iron Sucrose Injection What is this medication? IRON SUCROSE (EYE ern SOO krose) treats low levels of iron (iron deficiency anemia) in people with kidney disease. Iron is a mineral that plays an important role in making red blood cells, which carry oxygen from your lungs to the rest of your body. This medicine may be used for other purposes; ask your health care provider or pharmacist if you have questions. COMMON BRAND NAME(S): Venofer What should I tell my care team before I take this medication? They need to know if you have any of these conditions: Anemia not caused by low iron levels Heart disease High levels of iron in the blood Kidney disease Liver disease An unusual or allergic reaction to iron, other medications, foods, dyes, or preservatives Pregnant or trying to get pregnant Breastfeeding How should I use this medication? This medication is for infusion into a vein. It is given in a hospital or clinic setting. Talk to your care team about the use of this medication in children. While this medication may be prescribed for children as young as 2 years for selected conditions, precautions do apply. Overdosage: If you think you have taken too much of this medicine contact a poison control center or emergency room at once. NOTE: This medicine is only for you. Do not share this medicine with others. What if I miss a dose? Keep appointments for follow-up doses. It is important not to miss your dose. Call your care team if you are unable to keep an appointment. What may interact with this medication? Do not take this medication with any of the following: Deferoxamine Dimercaprol Other iron products This medication may also interact with the following: Chloramphenicol Deferasirox This list may not describe all possible interactions. Give your health care provider a list of all the medicines, herbs, non-prescription drugs, or dietary supplements you use. Also tell them if you smoke,  drink alcohol, or use illegal drugs. Some items may interact with your medicine. What should I watch for while using this medication? Visit your care team regularly. Tell your care team if your symptoms do not start to get better or if they get worse. You may need blood work done while you are taking this medication. You may need to follow a special diet. Talk to your care team. Foods that contain iron include: whole grains/cereals, dried fruits, beans, or peas, leafy green vegetables, and organ meats (liver, kidney). What side effects may I notice from receiving this medication? Side effects that you should report to your care team as soon as possible: Allergic reactions--skin rash, itching, hives, swelling of the face, lips, tongue, or throat Low blood pressure--dizziness, feeling faint or lightheaded, blurry vision Shortness of breath Side effects that usually do not require medical attention (report to your care team if they continue or are bothersome): Flushing Headache Joint pain Muscle pain Nausea Pain, redness, or irritation at injection site This list may not describe all possible side effects. Call your doctor for medical advice about side effects. You may report side effects to FDA at 1-800-FDA-1088. Where should I keep my medication? This medication is given in a hospital or clinic. It will not be stored at home. NOTE: This sheet is a summary. It may not cover all possible information. If you have questions about this medicine, talk to your doctor, pharmacist, or health care provider.  2024 Elsevier/Gold Standard (2022-11-12 00:00:00)

## 2023-06-22 ENCOUNTER — Encounter: Payer: Self-pay | Admitting: Family

## 2023-06-24 ENCOUNTER — Inpatient Hospital Stay: Payer: 59 | Attending: Family

## 2023-06-24 VITALS — BP 148/54 | HR 58 | Temp 98.2°F | Resp 18

## 2023-06-24 DIAGNOSIS — D509 Iron deficiency anemia, unspecified: Secondary | ICD-10-CM | POA: Diagnosis not present

## 2023-06-24 DIAGNOSIS — D500 Iron deficiency anemia secondary to blood loss (chronic): Secondary | ICD-10-CM

## 2023-06-24 MED ORDER — IRON SUCROSE 20 MG/ML IV SOLN
200.0000 mg | Freq: Once | INTRAVENOUS | Status: AC
Start: 2023-06-25 — End: 2023-06-24
  Administered 2023-06-24: 200 mg via INTRAVENOUS
  Filled 2023-06-24: qty 10

## 2023-06-24 MED ORDER — SODIUM CHLORIDE 0.9 % IV SOLN: Freq: Once | INTRAVENOUS | Status: AC

## 2023-06-24 MED ORDER — DIPHENHYDRAMINE HCL 25 MG PO CAPS
25.0000 mg | ORAL_CAPSULE | Freq: Once | ORAL | Status: AC
  Administered 2023-06-24: 25 mg via ORAL
  Filled 2023-06-24: qty 1

## 2023-06-24 MED ORDER — ACETAMINOPHEN 325 MG PO TABS
650.0000 mg | ORAL_TABLET | Freq: Once | ORAL | Status: AC
Start: 2023-06-24 — End: 2023-06-24
  Administered 2023-06-24: 650 mg via ORAL
  Filled 2023-06-24: qty 2

## 2023-06-24 NOTE — Patient Instructions (Signed)
 Iron Sucrose Injection What is this medication? IRON SUCROSE (EYE ern SOO krose) treats low levels of iron (iron deficiency anemia) in people with kidney disease. Iron is a mineral that plays an important role in making red blood cells, which carry oxygen from your lungs to the rest of your body. This medicine may be used for other purposes; ask your health care provider or pharmacist if you have questions. COMMON BRAND NAME(S): Venofer What should I tell my care team before I take this medication? They need to know if you have any of these conditions: Anemia not caused by low iron levels Heart disease High levels of iron in the blood Kidney disease Liver disease An unusual or allergic reaction to iron, other medications, foods, dyes, or preservatives Pregnant or trying to get pregnant Breastfeeding How should I use this medication? This medication is for infusion into a vein. It is given in a hospital or clinic setting. Talk to your care team about the use of this medication in children. While this medication may be prescribed for children as young as 2 years for selected conditions, precautions do apply. Overdosage: If you think you have taken too much of this medicine contact a poison control center or emergency room at once. NOTE: This medicine is only for you. Do not share this medicine with others. What if I miss a dose? Keep appointments for follow-up doses. It is important not to miss your dose. Call your care team if you are unable to keep an appointment. What may interact with this medication? Do not take this medication with any of the following: Deferoxamine Dimercaprol Other iron products This medication may also interact with the following: Chloramphenicol Deferasirox This list may not describe all possible interactions. Give your health care provider a list of all the medicines, herbs, non-prescription drugs, or dietary supplements you use. Also tell them if you smoke,  drink alcohol, or use illegal drugs. Some items may interact with your medicine. What should I watch for while using this medication? Visit your care team regularly. Tell your care team if your symptoms do not start to get better or if they get worse. You may need blood work done while you are taking this medication. You may need to follow a special diet. Talk to your care team. Foods that contain iron include: whole grains/cereals, dried fruits, beans, or peas, leafy green vegetables, and organ meats (liver, kidney). What side effects may I notice from receiving this medication? Side effects that you should report to your care team as soon as possible: Allergic reactions--skin rash, itching, hives, swelling of the face, lips, tongue, or throat Low blood pressure--dizziness, feeling faint or lightheaded, blurry vision Shortness of breath Side effects that usually do not require medical attention (report to your care team if they continue or are bothersome): Flushing Headache Joint pain Muscle pain Nausea Pain, redness, or irritation at injection site This list may not describe all possible side effects. Call your doctor for medical advice about side effects. You may report side effects to FDA at 1-800-FDA-1088. Where should I keep my medication? This medication is given in a hospital or clinic. It will not be stored at home. NOTE: This sheet is a summary. It may not cover all possible information. If you have questions about this medicine, talk to your doctor, pharmacist, or health care provider.  2024 Elsevier/Gold Standard (2022-11-12 00:00:00)

## 2023-07-01 ENCOUNTER — Inpatient Hospital Stay: Payer: 59

## 2023-07-01 VITALS — BP 188/70 | HR 68 | Temp 98.4°F | Resp 17

## 2023-07-01 DIAGNOSIS — D500 Iron deficiency anemia secondary to blood loss (chronic): Secondary | ICD-10-CM

## 2023-07-01 DIAGNOSIS — D509 Iron deficiency anemia, unspecified: Secondary | ICD-10-CM | POA: Diagnosis not present

## 2023-07-01 MED ORDER — DIPHENHYDRAMINE HCL 25 MG PO CAPS
25.0000 mg | ORAL_CAPSULE | Freq: Once | ORAL | Status: AC
  Administered 2023-07-01: 25 mg via ORAL
  Filled 2023-07-01: qty 1

## 2023-07-01 MED ORDER — SODIUM CHLORIDE 0.9 % IV SOLN: Freq: Once | INTRAVENOUS | Status: AC

## 2023-07-01 MED ORDER — ACETAMINOPHEN 325 MG PO TABS
650.0000 mg | ORAL_TABLET | Freq: Once | ORAL | Status: AC
  Administered 2023-07-01: 650 mg via ORAL
  Filled 2023-07-01: qty 2

## 2023-07-01 MED ORDER — IRON SUCROSE 20 MG/ML IV SOLN
200.0000 mg | Freq: Once | INTRAVENOUS | Status: AC
Start: 2023-07-02 — End: 2023-07-01
  Administered 2023-07-01: 200 mg via INTRAVENOUS

## 2023-07-01 NOTE — Patient Instructions (Signed)
 Iron Sucrose Injection What is this medication? IRON SUCROSE (EYE ern SOO krose) treats low levels of iron (iron deficiency anemia) in people with kidney disease. Iron is a mineral that plays an important role in making red blood cells, which carry oxygen from your lungs to the rest of your body. This medicine may be used for other purposes; ask your health care provider or pharmacist if you have questions. COMMON BRAND NAME(S): Venofer What should I tell my care team before I take this medication? They need to know if you have any of these conditions: Anemia not caused by low iron levels Heart disease High levels of iron in the blood Kidney disease Liver disease An unusual or allergic reaction to iron, other medications, foods, dyes, or preservatives Pregnant or trying to get pregnant Breastfeeding How should I use this medication? This medication is for infusion into a vein. It is given in a hospital or clinic setting. Talk to your care team about the use of this medication in children. While this medication may be prescribed for children as young as 2 years for selected conditions, precautions do apply. Overdosage: If you think you have taken too much of this medicine contact a poison control center or emergency room at once. NOTE: This medicine is only for you. Do not share this medicine with others. What if I miss a dose? Keep appointments for follow-up doses. It is important not to miss your dose. Call your care team if you are unable to keep an appointment. What may interact with this medication? Do not take this medication with any of the following: Deferoxamine Dimercaprol Other iron products This medication may also interact with the following: Chloramphenicol Deferasirox This list may not describe all possible interactions. Give your health care provider a list of all the medicines, herbs, non-prescription drugs, or dietary supplements you use. Also tell them if you smoke,  drink alcohol, or use illegal drugs. Some items may interact with your medicine. What should I watch for while using this medication? Visit your care team regularly. Tell your care team if your symptoms do not start to get better or if they get worse. You may need blood work done while you are taking this medication. You may need to follow a special diet. Talk to your care team. Foods that contain iron include: whole grains/cereals, dried fruits, beans, or peas, leafy green vegetables, and organ meats (liver, kidney). What side effects may I notice from receiving this medication? Side effects that you should report to your care team as soon as possible: Allergic reactions--skin rash, itching, hives, swelling of the face, lips, tongue, or throat Low blood pressure--dizziness, feeling faint or lightheaded, blurry vision Shortness of breath Side effects that usually do not require medical attention (report to your care team if they continue or are bothersome): Flushing Headache Joint pain Muscle pain Nausea Pain, redness, or irritation at injection site This list may not describe all possible side effects. Call your doctor for medical advice about side effects. You may report side effects to FDA at 1-800-FDA-1088. Where should I keep my medication? This medication is given in a hospital or clinic. It will not be stored at home. NOTE: This sheet is a summary. It may not cover all possible information. If you have questions about this medicine, talk to your doctor, pharmacist, or health care provider.  2024 Elsevier/Gold Standard (2022-11-12 00:00:00)

## 2023-07-01 NOTE — Progress Notes (Signed)
 Pt declined to stay for post infusion observation period. Pt stated he has tolerated medication multiple times prior without difficulty. Pt aware to call clinic with any questions or concerns. Pt verbalized understanding and had no further questions.

## 2023-07-04 ENCOUNTER — Other Ambulatory Visit: Payer: 59

## 2023-07-04 ENCOUNTER — Ambulatory Visit: Payer: 59

## 2023-07-04 DIAGNOSIS — A048 Other specified bacterial intestinal infections: Secondary | ICD-10-CM | POA: Diagnosis not present

## 2023-07-06 ENCOUNTER — Other Ambulatory Visit: Payer: Self-pay

## 2023-07-06 DIAGNOSIS — A048 Other specified bacterial intestinal infections: Secondary | ICD-10-CM

## 2023-07-06 LAB — H. PYLORI ANTIGEN, STOOL: H pylori Ag, Stl: POSITIVE — AB

## 2023-07-06 MED ORDER — AMOXICILLIN 500 MG PO TABS: 1000.0000 mg | ORAL_TABLET | Freq: Two times a day (BID) | ORAL | 0 refills | Status: DC

## 2023-07-06 MED ORDER — PANTOPRAZOLE SODIUM 40 MG PO TBEC: 40.0000 mg | DELAYED_RELEASE_TABLET | Freq: Two times a day (BID) | ORAL | 0 refills | Status: DC

## 2023-07-06 MED ORDER — LEVOFLOXACIN 500 MG PO TABS: 500.0000 mg | ORAL_TABLET | Freq: Every day | ORAL | 0 refills | Status: DC

## 2023-07-06 NOTE — Progress Notes (Signed)
 Mr. Seltzer,  Your H. Pylori stool antigen was still positive, indicating that the bacteria was not successfully eradicated with the previously used antibiotics.  We will need to use a different antibiotic regimen.  It is very important that you take these antibiotics as prescribed to maximize the chance of successful treatment.  Rock,  Please prescribe the following: Levofloxacin  500 mg PO once daily for 14 days Amoxicillin  1 gram PO twice daily for 14 days  Protonix  40 mg PO twice daily for 14 days  Please submit another stool antigen test 4 weeks after completing this treatment.

## 2023-07-06 NOTE — Progress Notes (Signed)
 L

## 2023-07-07 ENCOUNTER — Inpatient Hospital Stay: Payer: 59 | Admitting: Hematology & Oncology

## 2023-07-07 ENCOUNTER — Inpatient Hospital Stay: Payer: 59

## 2023-07-07 DIAGNOSIS — R809 Proteinuria, unspecified: Secondary | ICD-10-CM | POA: Diagnosis not present

## 2023-07-07 DIAGNOSIS — D631 Anemia in chronic kidney disease: Secondary | ICD-10-CM | POA: Diagnosis not present

## 2023-07-07 DIAGNOSIS — N189 Chronic kidney disease, unspecified: Secondary | ICD-10-CM | POA: Diagnosis not present

## 2023-07-08 ENCOUNTER — Inpatient Hospital Stay: Payer: 59

## 2023-07-14 DIAGNOSIS — D638 Anemia in other chronic diseases classified elsewhere: Secondary | ICD-10-CM | POA: Diagnosis not present

## 2023-07-14 DIAGNOSIS — C9000 Multiple myeloma not having achieved remission: Secondary | ICD-10-CM | POA: Diagnosis not present

## 2023-07-14 DIAGNOSIS — R809 Proteinuria, unspecified: Secondary | ICD-10-CM | POA: Diagnosis not present

## 2023-07-14 DIAGNOSIS — E1122 Type 2 diabetes mellitus with diabetic chronic kidney disease: Secondary | ICD-10-CM | POA: Diagnosis not present

## 2023-07-15 ENCOUNTER — Inpatient Hospital Stay (HOSPITAL_BASED_OUTPATIENT_CLINIC_OR_DEPARTMENT_OTHER): Payer: 59 | Admitting: Family

## 2023-07-15 ENCOUNTER — Inpatient Hospital Stay: Payer: 59

## 2023-07-15 ENCOUNTER — Encounter: Payer: Self-pay | Admitting: Family

## 2023-07-15 VITALS — BP 104/87 | HR 55 | Temp 97.5°F | Resp 17 | Ht 67.0 in | Wt 166.0 lb

## 2023-07-15 VITALS — BP 147/48 | HR 58 | Temp 97.5°F | Resp 20

## 2023-07-15 DIAGNOSIS — D631 Anemia in chronic kidney disease: Secondary | ICD-10-CM

## 2023-07-15 DIAGNOSIS — C9000 Multiple myeloma not having achieved remission: Secondary | ICD-10-CM | POA: Diagnosis not present

## 2023-07-15 DIAGNOSIS — D500 Iron deficiency anemia secondary to blood loss (chronic): Secondary | ICD-10-CM

## 2023-07-15 DIAGNOSIS — N183 Chronic kidney disease, stage 3 unspecified: Secondary | ICD-10-CM | POA: Diagnosis not present

## 2023-07-15 DIAGNOSIS — D509 Iron deficiency anemia, unspecified: Secondary | ICD-10-CM | POA: Diagnosis not present

## 2023-07-15 LAB — CMP (CANCER CENTER ONLY)
ALT: 25 U/L (ref 0–44)
AST: 28 U/L (ref 15–41)
Albumin: 4.7 g/dL (ref 3.5–5.0)
Alkaline Phosphatase: 60 U/L (ref 38–126)
Anion gap: 8 (ref 5–15)
BUN: 28 mg/dL — ABNORMAL HIGH (ref 8–23)
CO2: 27 mmol/L (ref 22–32)
Calcium: 9.7 mg/dL (ref 8.9–10.3)
Chloride: 106 mmol/L (ref 98–111)
Creatinine: 1.91 mg/dL — ABNORMAL HIGH (ref 0.61–1.24)
GFR, Estimated: 35 mL/min — ABNORMAL LOW (ref >60)
Glucose, Bld: 161 mg/dL — ABNORMAL HIGH (ref 70–99)
Potassium: 4.2 mmol/L (ref 3.5–5.1)
Sodium: 141 mmol/L (ref 135–145)
Total Bilirubin: 0.4 mg/dL (ref 0.0–1.2)
Total Protein: 6.5 g/dL (ref 6.5–8.1)

## 2023-07-15 LAB — CBC WITH DIFFERENTIAL (CANCER CENTER ONLY)
Abs Immature Granulocytes: 0.02 10*3/uL (ref 0.00–0.07)
Basophils Absolute: 0 10*3/uL (ref 0.0–0.1)
Basophils Relative: 0 %
Eosinophils Absolute: 0.1 10*3/uL (ref 0.0–0.5)
Eosinophils Relative: 1 %
HCT: 40.4 % (ref 39.0–52.0)
Hemoglobin: 12.3 g/dL — ABNORMAL LOW (ref 13.0–17.0)
Immature Granulocytes: 0 %
Lymphocytes Relative: 22 %
Lymphs Abs: 1.3 10*3/uL (ref 0.7–4.0)
MCH: 24.5 pg — ABNORMAL LOW (ref 26.0–34.0)
MCHC: 30.4 g/dL (ref 30.0–36.0)
MCV: 80.3 fL (ref 80.0–100.0)
Monocytes Absolute: 0.4 10*3/uL (ref 0.1–1.0)
Monocytes Relative: 7 %
Neutro Abs: 4 10*3/uL (ref 1.7–7.7)
Neutrophils Relative %: 70 %
Platelet Count: 172 10*3/uL (ref 150–400)
RBC: 5.03 MIL/uL (ref 4.22–5.81)
RDW: 21.5 % — ABNORMAL HIGH (ref 11.5–15.5)
WBC Count: 5.8 10*3/uL (ref 4.0–10.5)
nRBC: 0 % (ref 0.0–0.2)

## 2023-07-15 LAB — FERRITIN: Ferritin: 73 ng/mL (ref 24–336)

## 2023-07-15 LAB — IRON AND IRON BINDING CAPACITY (CC-WL,HP ONLY)
Iron: 64 ug/dL (ref 45–182)
Saturation Ratios: 13 % — ABNORMAL LOW (ref 17.9–39.5)
TIBC: 505 ug/dL — ABNORMAL HIGH (ref 250–450)
UIBC: 441 ug/dL — ABNORMAL HIGH (ref 117–376)

## 2023-07-15 MED ORDER — ACETAMINOPHEN 325 MG PO TABS
650.0000 mg | ORAL_TABLET | Freq: Once | ORAL | Status: AC
  Administered 2023-07-15: 650 mg via ORAL
  Filled 2023-07-15: qty 2

## 2023-07-15 MED ORDER — IRON SUCROSE 20 MG/ML IV SOLN
200.0000 mg | Freq: Once | INTRAVENOUS | Status: AC
  Administered 2023-07-15: 200 mg via INTRAVENOUS

## 2023-07-15 MED ORDER — SODIUM CHLORIDE 0.9 % IV SOLN: Freq: Once | INTRAVENOUS | Status: AC

## 2023-07-15 MED ORDER — DIPHENHYDRAMINE HCL 25 MG PO CAPS
25.0000 mg | ORAL_CAPSULE | Freq: Once | ORAL | Status: AC
  Administered 2023-07-15: 25 mg via ORAL
  Filled 2023-07-15: qty 1

## 2023-07-15 NOTE — Patient Instructions (Signed)

## 2023-07-15 NOTE — Progress Notes (Signed)
Hematology and Oncology Follow Up Visit  Marvin Salinas [id number] [birth year] 78 y.o. 07/15/2023   Principle Diagnosis:  Lambda light chain myeloma --normal cytogenetics Anemia of renal sufficiency   Current Therapy:        Faspro/Velcade/Revlimid -- started on 11/05/2021 - monthly -- revlimid stopped in 09/2022.  All therapy stopped on 02/11/2023 Aranesp 300 mcg subcu for hemoglobin less than 11   Interim History:  Marvin Salinas is here today for follow-up. His Hgb is looking good at 12.3!Marland Kitchen He is doing quite well and has no new complaints at this time.  He is still on treatment for H.Pylori in his stool. GI has him on Levaquin, Amoxicillin and Protonix.  Last month, Lambda light chains were 14.9 mg/L and IgG level 285 mg/dL.  No fever, chills, n/v, cough, rash, dizziness, SOB, chest pain, palpitations, abdominal pain or changes in bowel or bladder habits.  No swelling in his extremities.  Neuropathy in the hands and feet unchanged from baseline.  No falls or syncope reported.  Appetite and hydration are good. Weight is stable at 166 lbs.   ECOG Performance Status: 1 - Symptomatic but completely ambulatory  Medications:  Allergies as of 07/15/2023       Reactions   Cymbalta [duloxetine Hcl] Other (See Comments)   Halucinations   Codeine Nausea And Vomiting   Lisinopril Cough   Nsaids Other (See Comments)   CKD   Dapagliflozin Rash   Latex Hives        Medication List        Accurate as of July 15, 2023 10:09 AM. If you have any questions, ask your nurse or doctor.          acetaminophen 500 MG tablet Commonly known as: TYLENOL Take 1,000 mg by mouth every 6 (six) hours as needed for mild pain (pain score 1-3) or moderate pain (pain score 4-6).   amoxicillin 500 MG tablet Commonly known as: AMOXIL Take 2 tablets (1,000 mg total) by mouth 2 (two) times daily.   apixaban 5 MG Tabs tablet Commonly known as: Eliquis Take 1 tablet (5 mg total) by mouth 2 (two)  times daily. Further refills from cardio   BD Pen Needle Nano 2nd Gen 32G X 4 MM Misc Generic drug: Insulin Pen Needle Place 1 Needle onto the skin in the morning and at bedtime.   Bismuth 262 MG Chew Chew 524 mg by mouth in the morning, at noon, in the evening, and at bedtime.   dofetilide 125 MCG capsule Commonly known as: TIKOSYN Take 1 capsule (125 mcg total) by mouth 2 (two) times daily.   ezetimibe 10 MG tablet Commonly known as: ZETIA Take 1 tablet (10 mg total) by mouth daily.   gabapentin 300 MG capsule Commonly known as: NEURONTIN Take 1 capsule (300 mg total) by mouth 3 (three) times daily.   GENTEAL OP Place 1 drop into both eyes daily as needed (dry eyes).   insulin glargine 100 UNIT/ML Solostar Pen Commonly known as: LANTUS Inject 30-40 Units into the skin 2 (two) times daily.   levofloxacin 500 MG tablet Commonly known as: LEVAQUIN Take 1 tablet (500 mg total) by mouth daily.   loratadine 10 MG tablet Commonly known as: CLARITIN Take 10 mg by mouth daily as needed for allergies.   metoprolol tartrate 25 MG tablet Commonly known as: LOPRESSOR Take 0.5 tablets (12.5 mg total) by mouth 2 (two) times daily.   nitroGLYCERIN 0.4 MG SL tablet Commonly known as: NITROSTAT  Place 0.4 mg under the tongue every 5 (five) minutes as needed for chest pain.   olmesartan 5 MG tablet Commonly known as: BENICAR Take 5 mg by mouth daily.   ondansetron 8 MG tablet Commonly known as: ZOFRAN Take 1 tablet (8 mg total) by mouth every 8 (eight) hours as needed for nausea or vomiting.   OneTouch Delica Plus Lancet33G Misc USE UP TO FOUR TIMES DAILY AS DIRECTED   OneTouch Verio test strip Generic drug: glucose blood USE UP TO FOUR TIMES DAILY AS DIRECTED.   pantoprazole 40 MG tablet Commonly known as: PROTONIX Take 1 tablet (40 mg total) by mouth daily.   pantoprazole 40 MG tablet Commonly known as: PROTONIX Take 1 tablet (40 mg total) by mouth 2 (two) times daily  before a meal.   polyethylene glycol powder 17 GM/SCOOP powder Commonly known as: GLYCOLAX/MIRALAX Take 1 Container by mouth daily.   rosuvastatin 20 MG tablet Commonly known as: CRESTOR Take 1 tablet (20 mg total) by mouth at bedtime.   traZODone 50 MG tablet Commonly known as: DESYREL Take 0.5-1 tablets (25-50 mg total) by mouth at bedtime as needed for sleep.        Allergies:  Allergies  Allergen Reactions   Cymbalta [Duloxetine Hcl] Other (See Comments)    Halucinations   Codeine Nausea And Vomiting   Lisinopril Cough   Nsaids Other (See Comments)    CKD   Dapagliflozin Rash   Latex Hives    Past Medical History, Surgical history, Social history, and Family History were reviewed and updated.  Review of Systems: All other 10 point review of systems is negative.   Physical Exam:  vitals were not taken for this visit.   Wt Readings from Last 3 Encounters:  06/03/23 162 lb 12.8 oz (73.8 kg)  05/31/23 162 lb 3.2 oz (73.6 kg)  05/27/23 168 lb (76.2 kg)    Ocular: Sclerae unicteric, pupils equal, round and reactive to light Ear-nose-throat: Oropharynx clear, dentition fair Lymphatic: No cervical or supraclavicular adenopathy Lungs no rales or rhonchi, good excursion bilaterally Heart regular rate and rhythm, no murmur appreciated Abd soft, nontender, positive bowel sounds MSK no focal spinal tenderness, no joint edema Neuro: non-focal, well-oriented, appropriate affect Breasts: Deferred   Lab Results  Component Value Date   WBC 5.8 07/15/2023   HGB 12.3 (L) 07/15/2023   HCT 40.4 07/15/2023   MCV 80.3 07/15/2023   PLT 172 07/15/2023   Lab Results  Component Value Date   FERRITIN 8 (L) 06/03/2023   IRON 31 (L) 06/03/2023   TIBC 547 (H) 06/03/2023   UIBC 516 (H) 06/03/2023   IRONPCTSAT 6 (L) 06/03/2023   Lab Results  Component Value Date   RETICCTPCT 1.5 02/04/2021   RBC 5.03 07/15/2023   Lab Results  Component Value Date   KPAFRELGTCHN 15.2  06/03/2023   LAMBDASER 14.9 06/03/2023   KAPLAMBRATIO 1.02 06/03/2023   Lab Results  Component Value Date   IGGSERUM 285 (L) 06/03/2023   IGA 33 (L) 06/03/2023   IGMSERUM 15 06/03/2023   Lab Results  Component Value Date   TOTALPROTELP 5.7 (L) 05/04/2023   ALBUMINELP 3.7 05/04/2023   A1GS 0.3 05/04/2023   A2GS 0.6 05/04/2023   BETS 0.9 05/04/2023   GAMS 0.2 (L) 05/04/2023   MSPIKE Not Observed 05/04/2023   SPEI Comment 01/14/2023     Chemistry      Component Value Date/Time   NA 140 06/03/2023 1245   NA 140 08/31/2021 1026  K 4.2 06/03/2023 1245   CL 104 06/03/2023 1245   CO2 28 06/03/2023 1245   BUN 19 06/03/2023 1245   BUN 21 08/31/2021 1026   CREATININE 1.62 (H) 06/03/2023 1245      Component Value Date/Time   CALCIUM 9.3 06/03/2023 1245   ALKPHOS 56 06/03/2023 1245   AST 17 06/03/2023 1245   ALT 12 06/03/2023 1245   BILITOT 0.3 06/03/2023 1245       Impression and Plan: Marvin Salinas is a pleasant 78 yo gentlemen with history of lambda chain myeloma as well as anemia secondary to chronic renal insufficiency.  No ESA needed at this time, Hgb 12.3.  He is scheduled for another IV iron infusion today.  Protein studies pending.  Follow-up in 6 more weeks.   Eileen Stanford, NP 1/24/202510:09 AM

## 2023-07-17 LAB — IGG, IGA, IGM
IgA: 31 mg/dL — ABNORMAL LOW (ref 61–437)
IgG (Immunoglobin G), Serum: 301 mg/dL — ABNORMAL LOW (ref 603–1613)
IgM (Immunoglobulin M), Srm: 14 mg/dL — ABNORMAL LOW (ref 15–143)

## 2023-07-18 LAB — KAPPA/LAMBDA LIGHT CHAINS
Kappa free light chain: 16.6 mg/L (ref 3.3–19.4)
Kappa, lambda light chain ratio: 1.22 (ref 0.26–1.65)
Lambda free light chains: 13.6 mg/L (ref 5.7–26.3)

## 2023-07-21 LAB — PROTEIN ELECTROPHORESIS, SERUM, WITH REFLEX
A/G Ratio: 1.7 (ref 0.7–1.7)
Albumin ELP: 4 g/dL (ref 2.9–4.4)
Alpha-1-Globulin: 0.3 g/dL (ref 0.0–0.4)
Alpha-2-Globulin: 0.8 g/dL (ref 0.4–1.0)
Beta Globulin: 1 g/dL (ref 0.7–1.3)
Gamma Globulin: 0.4 g/dL (ref 0.4–1.8)
Globulin, Total: 2.4 g/dL (ref 2.2–3.9)
Total Protein ELP: 6.4 g/dL (ref 6.0–8.5)

## 2023-07-22 ENCOUNTER — Inpatient Hospital Stay: Payer: 59

## 2023-07-22 VITALS — BP 174/58 | HR 48 | Temp 97.8°F | Resp 19

## 2023-07-22 DIAGNOSIS — D500 Iron deficiency anemia secondary to blood loss (chronic): Secondary | ICD-10-CM

## 2023-07-22 DIAGNOSIS — D509 Iron deficiency anemia, unspecified: Secondary | ICD-10-CM | POA: Diagnosis not present

## 2023-07-22 MED ORDER — DARBEPOETIN ALFA 300 MCG/0.6ML IJ SOSY: 300.0000 ug | PREFILLED_SYRINGE | Freq: Once | INTRAMUSCULAR | Status: DC

## 2023-07-22 MED ORDER — SODIUM CHLORIDE 0.9 % IV SOLN: Freq: Once | INTRAVENOUS | Status: AC

## 2023-07-22 MED ORDER — DIPHENHYDRAMINE HCL 25 MG PO CAPS
25.0000 mg | ORAL_CAPSULE | Freq: Once | ORAL | Status: AC
  Administered 2023-07-22: 25 mg via ORAL
  Filled 2023-07-22: qty 1

## 2023-07-22 MED ORDER — IRON SUCROSE 20 MG/ML IV SOLN
200.0000 mg | Freq: Once | INTRAVENOUS | Status: AC
  Administered 2023-07-22: 200 mg via INTRAVENOUS
  Filled 2023-07-22: qty 10

## 2023-07-22 MED ORDER — ACETAMINOPHEN 325 MG PO TABS
650.0000 mg | ORAL_TABLET | Freq: Once | ORAL | Status: AC
Start: 2023-07-22 — End: 2023-07-22
  Administered 2023-07-22: 650 mg via ORAL
  Filled 2023-07-22: qty 2

## 2023-07-29 ENCOUNTER — Telehealth: Payer: Self-pay | Admitting: Gastroenterology

## 2023-07-29 NOTE — Telephone Encounter (Signed)
 Patient called and stated that he was wondering when he should come back to be seen. Patient stated that he finish his antibiotics yesterday. Patient is requesting a call back please advise.

## 2023-07-29 NOTE — Telephone Encounter (Signed)
 Left message for pt that he needs to come pick up stool collection kit and hold his protonix  for 2 weeks, then collect the stool specimen and return it to the lab. After we get the results then we can let him know when/if he needs to follow-up.

## 2023-08-15 ENCOUNTER — Other Ambulatory Visit: Payer: 59

## 2023-08-15 DIAGNOSIS — A048 Other specified bacterial intestinal infections: Secondary | ICD-10-CM | POA: Diagnosis not present

## 2023-08-17 LAB — H. PYLORI ANTIGEN, STOOL: H pylori Ag, Stl: NEGATIVE

## 2023-08-18 DIAGNOSIS — C44329 Squamous cell carcinoma of skin of other parts of face: Secondary | ICD-10-CM | POA: Diagnosis not present

## 2023-08-18 DIAGNOSIS — D485 Neoplasm of uncertain behavior of skin: Secondary | ICD-10-CM | POA: Diagnosis not present

## 2023-08-18 DIAGNOSIS — L570 Actinic keratosis: Secondary | ICD-10-CM | POA: Diagnosis not present

## 2023-08-22 ENCOUNTER — Encounter: Payer: Self-pay | Admitting: Gastroenterology

## 2023-08-22 NOTE — Progress Notes (Signed)
 Mr. Koury,  Your H. Pylori stool test was negative, indicating that the bacteria was successfully eradicated.  No further testing or treatment is needed.

## 2023-08-26 ENCOUNTER — Encounter: Payer: Self-pay | Admitting: Hematology & Oncology

## 2023-08-26 ENCOUNTER — Inpatient Hospital Stay: Payer: 59 | Admitting: Hematology & Oncology

## 2023-08-26 ENCOUNTER — Inpatient Hospital Stay: Payer: 59 | Attending: Family

## 2023-08-26 ENCOUNTER — Other Ambulatory Visit: Payer: Self-pay

## 2023-08-26 VITALS — BP 131/58 | HR 47 | Temp 97.4°F | Resp 18 | Ht 67.0 in | Wt 168.0 lb

## 2023-08-26 DIAGNOSIS — K922 Gastrointestinal hemorrhage, unspecified: Secondary | ICD-10-CM | POA: Diagnosis not present

## 2023-08-26 DIAGNOSIS — Z79899 Other long term (current) drug therapy: Secondary | ICD-10-CM | POA: Insufficient documentation

## 2023-08-26 DIAGNOSIS — D631 Anemia in chronic kidney disease: Secondary | ICD-10-CM | POA: Diagnosis not present

## 2023-08-26 DIAGNOSIS — N183 Chronic kidney disease, stage 3 unspecified: Secondary | ICD-10-CM

## 2023-08-26 DIAGNOSIS — Z9221 Personal history of antineoplastic chemotherapy: Secondary | ICD-10-CM | POA: Insufficient documentation

## 2023-08-26 DIAGNOSIS — D500 Iron deficiency anemia secondary to blood loss (chronic): Secondary | ICD-10-CM | POA: Diagnosis not present

## 2023-08-26 DIAGNOSIS — C9001 Multiple myeloma in remission: Secondary | ICD-10-CM | POA: Diagnosis not present

## 2023-08-26 DIAGNOSIS — N189 Chronic kidney disease, unspecified: Secondary | ICD-10-CM | POA: Diagnosis not present

## 2023-08-26 DIAGNOSIS — C9000 Multiple myeloma not having achieved remission: Secondary | ICD-10-CM

## 2023-08-26 LAB — RETICULOCYTES
Immature Retic Fract: 12.1 % (ref 2.3–15.9)
RBC.: 5.06 MIL/uL (ref 4.22–5.81)
Retic Count, Absolute: 64.3 10*3/uL (ref 19.0–186.0)
Retic Ct Pct: 1.3 % (ref 0.4–3.1)

## 2023-08-26 LAB — CMP (CANCER CENTER ONLY)
ALT: 21 U/L (ref 0–44)
AST: 21 U/L (ref 15–41)
Albumin: 4.6 g/dL (ref 3.5–5.0)
Alkaline Phosphatase: 54 U/L (ref 38–126)
Anion gap: 7 (ref 5–15)
BUN: 17 mg/dL (ref 8–23)
CO2: 30 mmol/L (ref 22–32)
Calcium: 9.5 mg/dL (ref 8.9–10.3)
Chloride: 106 mmol/L (ref 98–111)
Creatinine: 1.46 mg/dL — ABNORMAL HIGH (ref 0.61–1.24)
GFR, Estimated: 49 mL/min — ABNORMAL LOW (ref >60)
Glucose, Bld: 111 mg/dL — ABNORMAL HIGH (ref 70–99)
Potassium: 4.3 mmol/L (ref 3.5–5.1)
Sodium: 143 mmol/L (ref 135–145)
Total Bilirubin: 0.5 mg/dL (ref 0.0–1.2)
Total Protein: 6.6 g/dL (ref 6.5–8.1)

## 2023-08-26 LAB — CBC WITH DIFFERENTIAL (CANCER CENTER ONLY)
Abs Immature Granulocytes: 0.06 10*3/uL (ref 0.00–0.07)
Basophils Absolute: 0 10*3/uL (ref 0.0–0.1)
Basophils Relative: 1 %
Eosinophils Absolute: 0.1 10*3/uL (ref 0.0–0.5)
Eosinophils Relative: 2 %
HCT: 41.5 % (ref 39.0–52.0)
Hemoglobin: 13.4 g/dL (ref 13.0–17.0)
Immature Granulocytes: 1 %
Lymphocytes Relative: 22 %
Lymphs Abs: 1.4 10*3/uL (ref 0.7–4.0)
MCH: 26.4 pg (ref 26.0–34.0)
MCHC: 32.3 g/dL (ref 30.0–36.0)
MCV: 81.7 fL (ref 80.0–100.0)
Monocytes Absolute: 0.5 10*3/uL (ref 0.1–1.0)
Monocytes Relative: 8 %
Neutro Abs: 4.4 10*3/uL (ref 1.7–7.7)
Neutrophils Relative %: 66 %
Platelet Count: 177 10*3/uL (ref 150–400)
RBC: 5.08 MIL/uL (ref 4.22–5.81)
RDW: 21.2 % — ABNORMAL HIGH (ref 11.5–15.5)
WBC Count: 6.4 10*3/uL (ref 4.0–10.5)
nRBC: 0 % (ref 0.0–0.2)

## 2023-08-26 LAB — IRON AND IRON BINDING CAPACITY (CC-WL,HP ONLY)
Iron: 69 ug/dL (ref 45–182)
Saturation Ratios: 16 % — ABNORMAL LOW (ref 17.9–39.5)
TIBC: 420 ug/dL (ref 250–450)
UIBC: 351 ug/dL (ref 117–376)

## 2023-08-26 LAB — FERRITIN: Ferritin: 61 ng/mL (ref 24–336)

## 2023-08-26 LAB — LACTATE DEHYDROGENASE: LDH: 169 U/L (ref 98–192)

## 2023-08-26 NOTE — Progress Notes (Signed)
 Hematology and Oncology Follow Up Visit  Marvin Salinas [id number] [birth year] 78 y.o. 08/26/2023   Principle Diagnosis:  Lambda light chain myeloma --normal cytogenetics Anemia of renal sufficiency Iron deficiency anemia secondary to GI bleed   Current Therapy:        Faspro/Velcade/Revlimid -- started on 11/05/2021 - monthly -- revlimid stopped in 09/2022.  All therapy stopped on 02/11/2023 Aranesp 300 mcg subcu for hemoglobin less than 11 Venofer 200 mg IV given on 07/22/2023   Interim History:  Marvin Salinas is here today for follow-up.  So far, he is doing quite nicely.  Ever since he was in the hospital back in November with a GI bleed, things have gotten a whole lot better for him.  Back in November, he was found to have AV malformations and Helicobacter.  These were all taken care of.  His hemoglobin continues to improve.  Today, his hemoglobin is 13.4.  He feels better.  He does have the neuropathy which is chronic.  His last lambda light chain was down to 1.4 mg/dL.  He has had no fever.  He has had no nausea or vomiting.  There is been no change in bowel or bladder habits.  He has had no leg swelling.  Again, he has neuropathy which is most pressing problem.  Overall, I would say his performance status is probably ECOG 1.    Medications:  Allergies as of 08/26/2023       Reactions   Cymbalta [duloxetine Hcl] Other (See Comments)   Halucinations   Codeine Nausea And Vomiting   Lisinopril Cough   Nsaids Other (See Comments)   CKD   Dapagliflozin Rash   Latex Hives        Medication List        Accurate as of August 26, 2023 11:14 AM. If you have any questions, ask your nurse or doctor.          STOP taking these medications    amoxicillin 500 MG tablet Commonly known as: AMOXIL Stopped by: Josph Macho   levofloxacin 500 MG tablet Commonly known as: LEVAQUIN Stopped by: Josph Macho   pantoprazole 40 MG tablet Commonly known as: PROTONIX Stopped  by: Josph Macho       TAKE these medications    acetaminophen 500 MG tablet Commonly known as: TYLENOL Take 1,000 mg by mouth every 6 (six) hours as needed for mild pain (pain score 1-3) or moderate pain (pain score 4-6).   apixaban 5 MG Tabs tablet Commonly known as: Eliquis Take 1 tablet (5 mg total) by mouth 2 (two) times daily. Further refills from cardio   BD Pen Needle Nano 2nd Gen 32G X 4 MM Misc Generic drug: Insulin Pen Needle Place 1 Needle onto the skin in the morning and at bedtime.   Bismuth 262 MG Chew Chew 524 mg by mouth in the morning, at noon, in the evening, and at bedtime.   dofetilide 125 MCG capsule Commonly known as: TIKOSYN Take 1 capsule (125 mcg total) by mouth 2 (two) times daily.   ezetimibe 10 MG tablet Commonly known as: ZETIA Take 1 tablet (10 mg total) by mouth daily.   gabapentin 300 MG capsule Commonly known as: NEURONTIN Take 1 capsule (300 mg total) by mouth 3 (three) times daily.   GENTEAL OP Place 1 drop into both eyes daily as needed (dry eyes).   insulin glargine 100 UNIT/ML Solostar Pen Commonly known as: LANTUS Inject 30-40 Units into  the skin 2 (two) times daily.   loratadine 10 MG tablet Commonly known as: CLARITIN Take 10 mg by mouth daily as needed for allergies.   metoprolol tartrate 25 MG tablet Commonly known as: LOPRESSOR Take 0.5 tablets (12.5 mg total) by mouth 2 (two) times daily.   nitroGLYCERIN 0.4 MG SL tablet Commonly known as: NITROSTAT Place 0.4 mg under the tongue every 5 (five) minutes as needed for chest pain.   olmesartan 5 MG tablet Commonly known as: BENICAR Take 5 mg by mouth daily.   ondansetron 8 MG tablet Commonly known as: ZOFRAN Take 1 tablet (8 mg total) by mouth every 8 (eight) hours as needed for nausea or vomiting.   OneTouch Delica Plus Lancet33G Misc USE UP TO FOUR TIMES DAILY AS DIRECTED   OneTouch Verio test strip Generic drug: glucose blood USE UP TO FOUR TIMES DAILY  AS DIRECTED.   polyethylene glycol powder 17 GM/SCOOP powder Commonly known as: GLYCOLAX/MIRALAX Take 1 Container by mouth daily.   rosuvastatin 20 MG tablet Commonly known as: CRESTOR Take 1 tablet (20 mg total) by mouth at bedtime.   traZODone 50 MG tablet Commonly known as: DESYREL Take 0.5-1 tablets (25-50 mg total) by mouth at bedtime as needed for sleep.        Allergies:  Allergies  Allergen Reactions   Cymbalta [Duloxetine Hcl] Other (See Comments)    Halucinations   Codeine Nausea And Vomiting   Lisinopril Cough   Nsaids Other (See Comments)    CKD   Dapagliflozin Rash   Latex Hives    Past Medical History, Surgical history, Social history, and Family History were reviewed and updated.  Review of Systems: All other 10 point review of systems is negative.   Physical Exam:  height is 5\' 7"  (1.702 m) and weight is 168 lb (76.2 kg). His oral temperature is 97.4 F (36.3 C) (abnormal). His blood pressure is 131/58 (abnormal) and his pulse is 47 (abnormal). His respiration is 18 and oxygen saturation is 99%.   Wt Readings from Last 3 Encounters:  08/26/23 168 lb (76.2 kg)  07/15/23 166 lb (75.3 kg)  06/03/23 162 lb 12.8 oz (73.8 kg)    Ocular: Sclerae unicteric, pupils equal, round and reactive to light Ear-nose-throat: Oropharynx clear, dentition fair Lymphatic: No cervical or supraclavicular adenopathy Lungs no rales or rhonchi, good excursion bilaterally Heart regular rate and rhythm, no murmur appreciated Abd soft, nontender, positive bowel sounds MSK no focal spinal tenderness, no joint edema Neuro: non-focal, well-oriented, appropriate affect Breasts: Deferred   Lab Results  Component Value Date   WBC 6.4 08/26/2023   HGB 13.4 08/26/2023   HCT 41.5 08/26/2023   MCV 81.7 08/26/2023   PLT 177 08/26/2023   Lab Results  Component Value Date   FERRITIN 73 07/15/2023   IRON 64 07/15/2023   TIBC 505 (H) 07/15/2023   UIBC 441 (H) 07/15/2023    IRONPCTSAT 13 (L) 07/15/2023   Lab Results  Component Value Date   RETICCTPCT 1.3 08/26/2023   RBC 5.06 08/26/2023   Lab Results  Component Value Date   KPAFRELGTCHN 16.6 07/15/2023   LAMBDASER 13.6 07/15/2023   KAPLAMBRATIO 1.22 07/15/2023   Lab Results  Component Value Date   IGGSERUM 301 (L) 07/15/2023   IGA 31 (L) 07/15/2023   IGMSERUM 14 (L) 07/15/2023   Lab Results  Component Value Date   TOTALPROTELP 6.4 07/15/2023   ALBUMINELP 4.0 07/15/2023   A1GS 0.3 07/15/2023   A2GS 0.8 07/15/2023  BETS 1.0 07/15/2023   GAMS 0.4 07/15/2023   MSPIKE Not Observed 07/15/2023   SPEI Comment 01/14/2023     Chemistry      Component Value Date/Time   NA 143 08/26/2023 0934   NA 140 08/31/2021 1026   K 4.3 08/26/2023 0934   CL 106 08/26/2023 0934   CO2 30 08/26/2023 0934   BUN 17 08/26/2023 0934   BUN 21 08/31/2021 1026   CREATININE 1.46 (H) 08/26/2023 0934      Component Value Date/Time   CALCIUM 9.5 08/26/2023 0934   ALKPHOS 54 08/26/2023 0934   AST 21 08/26/2023 0934   ALT 21 08/26/2023 0934   BILITOT 0.5 08/26/2023 0934       Impression and Plan: Mr. Cambria is a pleasant 78 yo gentlemen with history of lambda chain myeloma as well as anemia secondary to chronic renal insufficiency.   He has been off chemotherapy now for 7 months.  I am very happy that we are able to keep him off the chemotherapy.  Right now, I cannot imagine that his light chain is going to be a problem.  He does not need any ESA.  The fact that his GI bleeding had stopped was the key.  We will plan to get him back in 2 months now.  I think that as long as he is doing okay, we should be able move his appointments out gradually.   Josph Macho, MD 3/7/202511:14 AM

## 2023-08-29 LAB — MULTIPLE MYELOMA PANEL, SERUM
Albumin SerPl Elph-Mcnc: 3.7 g/dL (ref 2.9–4.4)
Albumin/Glob SerPl: 1.8 — ABNORMAL HIGH (ref 0.7–1.7)
Alpha 1: 0.2 g/dL (ref 0.0–0.4)
Alpha2 Glob SerPl Elph-Mcnc: 0.7 g/dL (ref 0.4–1.0)
B-Globulin SerPl Elph-Mcnc: 0.8 g/dL (ref 0.7–1.3)
Gamma Glob SerPl Elph-Mcnc: 0.3 g/dL — ABNORMAL LOW (ref 0.4–1.8)
Globulin, Total: 2.1 g/dL — ABNORMAL LOW (ref 2.2–3.9)
IgA: 30 mg/dL — ABNORMAL LOW (ref 61–437)
IgG (Immunoglobin G), Serum: 314 mg/dL — ABNORMAL LOW (ref 603–1613)
IgM (Immunoglobulin M), Srm: 11 mg/dL — ABNORMAL LOW (ref 15–143)
Total Protein ELP: 5.8 g/dL — ABNORMAL LOW (ref 6.0–8.5)

## 2023-08-29 LAB — KAPPA/LAMBDA LIGHT CHAINS
Kappa free light chain: 14.5 mg/L (ref 3.3–19.4)
Kappa, lambda light chain ratio: 1.01 (ref 0.26–1.65)
Lambda free light chains: 14.3 mg/L (ref 5.7–26.3)

## 2023-09-06 ENCOUNTER — Encounter: Payer: Self-pay | Admitting: Cardiovascular Disease

## 2023-09-06 ENCOUNTER — Ambulatory Visit: Payer: 59 | Attending: Cardiovascular Disease | Admitting: Cardiovascular Disease

## 2023-09-06 VITALS — BP 126/58 | HR 44 | Ht 68.0 in | Wt 165.4 lb

## 2023-09-06 DIAGNOSIS — I6523 Occlusion and stenosis of bilateral carotid arteries: Secondary | ICD-10-CM

## 2023-09-06 DIAGNOSIS — I739 Peripheral vascular disease, unspecified: Secondary | ICD-10-CM | POA: Diagnosis not present

## 2023-09-06 DIAGNOSIS — E785 Hyperlipidemia, unspecified: Secondary | ICD-10-CM | POA: Diagnosis not present

## 2023-09-06 DIAGNOSIS — Z720 Tobacco use: Secondary | ICD-10-CM | POA: Diagnosis not present

## 2023-09-06 DIAGNOSIS — I4819 Other persistent atrial fibrillation: Secondary | ICD-10-CM | POA: Diagnosis not present

## 2023-09-06 DIAGNOSIS — I25118 Atherosclerotic heart disease of native coronary artery with other forms of angina pectoris: Secondary | ICD-10-CM | POA: Diagnosis not present

## 2023-09-06 MED ORDER — CARVEDILOL 3.125 MG PO TABS: 3.1250 mg | ORAL_TABLET | Freq: Two times a day (BID) | ORAL | 1 refills | Status: DC

## 2023-09-06 NOTE — Patient Instructions (Signed)
 Medication Instructions:  STOP the Metoprolol  START Carvedilol (Coreg) 3.125 mg twice daily  *If you need a refill on your cardiac medications before your next appointment, please call your pharmacy*   Lab Work: None ordered If you have labs (blood work) drawn today and your tests are completely normal, you will receive your results only by: MyChart Message (if you have MyChart) OR A paper copy in the mail If you have any lab test that is abnormal or we need to change your treatment, we will call you to review the results.   Testing/Procedures: Your physician has requested that you have an ankle brachial index (ABI) in July. During this test an ultrasound and blood pressure cuff are used to evaluate the arteries that supply the arms and legs with blood. Allow thirty minutes for this exam. There are no restrictions or special instructions. This will take place at [address], [address].  Marland Kitchen Your physician has requested that you have an Aorta/Iliac Duplex in July. This will be take place at [address] [facility], [address].  No food after 11PM the night before.  Water is OK. (Don't drink liquids if you have been instructed not to for ANOTHER test) Avoid foods that produce bowel gas, for 24 hours prior to exam (see below). No breakfast, no chewing gum, no smoking or carbonated beverages. Patient may take morning medications with water. Come in for test at least 15 minutes early to register.   Your physician has requested that you have a carotid duplex in July. This test is an ultrasound of the carotid arteries in your neck. It looks at blood flow through these arteries that supply the brain with blood. Allow one hour for this exam. There are no restrictions or special instructions. This will take place at [address] [facility], [address].  Please note: We ask at that you not bring children with you during ultrasound (echo/ vascular) testing. Due to room size and safety concerns, children  are not allowed in the ultrasound rooms during exams. Our front office staff cannot provide observation of children in our lobby area while testing is being conducted. An adult accompanying a patient to their appointment will only be allowed in the ultrasound room at the discretion of the ultrasound technician under special circumstances. We apologize for any inconvenience.    Follow-Up: At [facility], you and your health needs are our priority.  As part of our continuing mission to provide you with exceptional heart care, we have created designated Provider Care Teams.  These Care Teams include your primary Cardiologist (physician) and Advanced Practice Providers (APPs -  Physician Assistants and Nurse Practitioners) who all work together to provide you with the care you need, when you need it.  We recommend signing up for the patient portal called "MyChart".  Sign up information is provided on this After Visit Summary.  MyChart is used to connect with patients for Virtual Visits (Telemedicine).  Patients are able to view lab/test results, encounter notes, upcoming appointments, etc.  Non-urgent messages can be sent to your provider as well.   To learn more about what you can do with MyChart, go to [email or web address].    Your next appointment:   6 month(s)  Provider:   Dr. Kirke Corin

## 2023-09-06 NOTE — Progress Notes (Signed)
 Cardiology Office Note   Date:  09/06/2023   ID:  Marvin Salinas, DOB 1946/04/09, MRN [id number]  PCP:  Natalia Leatherwood, DO  Cardiologist:  Dr. Elease Hashimoto  No chief complaint on file.      History of Present Illness: Marvin Salinas is a 78 y.o. male who is here today for a follow-up visit regarding peripheral arterial disease.  He has known history of coronary artery disease status post CABG in 2001, paroxysmal atrial fibrillation on anticoagulation, essential hypertension, carotid disease, hyperlipidemia, diabetes and prior TIAs. Patient has known history of peripheral arterial disease .  He does have underlying chronic kidney disease with creatinine between 1.6-2.  The patient is followed for peripheral arterial disease with previous severe claudication.  Angiography in December 2020 showed heavily calcified subtotal occlusion of the left common iliac artery with moderate disease affecting the right common iliac artery.  I performed successful angioplasty and and balloon expandable stent placement to the left common iliac artery.   He has known history of low back pain and bilateral knee joint pain.    He has known severe lumbar radiculopathy.   He quit smoking in January of 2023.  He has known history of vertigo and severe neuropathy.  He was hospitalized in November 2024 with lower GI bleed with a hemoglobin of 6.1.  He improved with transfusion.  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  His Eliquis was resumed after 48 hours.  We elected not to resume aspirin.  Before his hospitalization when he was seen in our office he complained of chest pain worrisome for atypical angina.  He underwent a cardiac PET scan on November 26 which showed evidence of anterior and anterolateral ischemia with an EF of 50%.  His chest pain resolved once his anemia was treated.  He has been doing well overall with no recurrent chest pain or shortness of breath.  He is noted to be bradycardic  today.  He reports orthostatic dizziness but no syncope or presyncope.  He finished chemotherapy for multiple myeloma in June.   Past Medical History:  Diagnosis Date   Acute kidney injury superimposed on chronic kidney disease (HCC) 10/12/2017   Atrial fibrillation with RVR (HCC) 10/27/2017   Back pain    Basilar artery stenosis    on chronic Plavix   Benign prostatic hypertrophy without urinary obstruction 04/17/2014   Carotid artery disease (HCC) 10/06/2010   Carotid US 04/2019: Bilat ICA 40-59; L subclavian stenosis  // Carotid US 11/21: Bilat ICA 40-59; bilateral subclavian stenosis // Carotid US 11/22: Bilateral ICA 40-59; left subclavian stenosis // Carotid US 04/28/2022: Bilateral ICA 40-59; left subclavian stenosis   Cerebral vascular disease    with prior TIA's; followed by Dr. Sandria Manly   Depression, neurotic 11/21/2013   Diabetes mellitus    on insulin   Enthesopathy of ankle and tarsus 09/04/2007   Overview:  Metatarsalgia    Enthesopathy of ankle and tarsus 09/04/2007   Overview:  Metatarsalgia  Formatting of this note might be different from the original. Metatarsalgia  10/1 IMO update   History of renal calculi    Hyperlipidemia    Hypertension    Ischemic heart disease    prior PCI to RCA in 1989. S/P PCI to LAD and OM in 1992. S/P PCI to first DX in 2000. S/P CABG x 3 in May 2011   Lambda light chain myeloma (HCC) 10/28/2021   Left hip pain 01/03/2020   OSA (obstructive sleep  apnea) 01/11/2018   AHI 18.1 and SaO2 low 73%   PAD (peripheral artery disease) (HCC) 11/18/2017   ABIs/Arterial US 04/2019: R 1.21; L 0.66 // R SFA 30-49, stable > 50 CIA and EIA stenosis; L > 50 CIA stenosis (likely represents severe stenosis or short segment occlusion)   Peripheral neuropathy    Pneumonia 02/2011   Sacroiliac joint dysfunction of left side 01/03/2020   Stroke (HCC) 04/16/2001   small right cerebellar infarct on 04/16/2001 at that time he was found to have proximal left  vertebral artery, proximal left common carotid artery and both external carotid artery stenosis as well as intracranial stenosis involving mid basilar artery- 07/2011 add questionable TIA    Past Surgical History:  Procedure Laterality Date    NASAL ENDOSCOPY  01/11/2020   chronic rhinitis w/o evidence of acute sinusitis, bilateral inferior turbinate hypertrophy   ABDOMINAL AORTOGRAM W/LOWER EXTREMITY Bilateral 05/30/2019   Procedure: ABDOMINAL AORTOGRAM W/LOWER EXTREMITY;  Surgeon: Iran Ouch, MD;  Location: [facility];  Service: Cardiovascular;  Laterality: Bilateral;   ANGIOPLASTY  1989   right coronary artery   ANGIOPLASTY  1992   LAD and OM   ANGIOPLASTY  1998   First DX   BIOPSY  05/06/2023   Procedure: BIOPSY;  Surgeon: Jenel Lucks, MD;  Location: [facility];  Service: Gastroenterology;;   BRAIN SURGERY     on prior records   CORONARY ARTERY BYPASS GRAFT  11/12/2009   LIMA to LAD, SVG to OM and SVG to RCA   CORONARY STENT PLACEMENT  2000   Stent to LAD/Circumflex with angioplasty to first diagonal    ENTEROSCOPY N/A 05/06/2023   Procedure: ENTEROSCOPY;  Surgeon: Jenel Lucks, MD;  Location: [facility];  Service: Gastroenterology;  Laterality: N/A;   HOT HEMOSTASIS N/A 05/06/2023   Procedure: HOT HEMOSTASIS (ARGON PLASMA COAGULATION/BICAP);  Surgeon: Jenel Lucks, MD;  Location: [facility];  Service: Gastroenterology;  Laterality: N/A;   LEFT HEART CATH AND CORS/GRAFTS ANGIOGRAPHY N/A 10/13/2017   Procedure: LEFT HEART CATH AND CORS/GRAFTS ANGIOGRAPHY;  Surgeon: Yvonne Kendall, MD;  Location: [facility];  Service: Cardiovascular;  Laterality: N/A;   SUBMUCOSAL TATTOO INJECTION  05/06/2023   Procedure: SUBMUCOSAL TATTOO INJECTION;  Surgeon: Jenel Lucks, MD;  Location: [facility];  Service: Gastroenterology;;     Current Outpatient Medications  Medication Sig Dispense Refill   acetaminophen (TYLENOL) 500 MG tablet  Take 1,000 mg by mouth every 6 (six) hours as needed for mild pain (pain score 1-3) or moderate pain (pain score 4-6).     apixaban (ELIQUIS) 5 MG TABS tablet Take 1 tablet (5 mg total) by mouth 2 (two) times daily. Further refills from cardio 180 tablet 0   BD PEN NEEDLE NANO 2ND GEN 32G X 4 MM MISC Place 1 Needle onto the skin in the morning and at bedtime.     Carboxymethylcell-Hypromellose (GENTEAL OP) Place 1 drop into both eyes daily as needed (dry eyes).     dofetilide (TIKOSYN) 125 MCG capsule Take 1 capsule (125 mcg total) by mouth 2 (two) times daily. 180 capsule 2   ezetimibe (ZETIA) 10 MG tablet Take 1 tablet (10 mg total) by mouth daily. 90 tablet 3   famciclovir (FAMVIR) 250 MG tablet Take 250 mg by mouth daily.     gabapentin (NEURONTIN) 300 MG capsule Take 1 capsule (300 mg total) by mouth 3 (three) times daily. 90 capsule 3   glucose blood (ONETOUCH VERIO)  test strip USE UP TO FOUR TIMES DAILY AS DIRECTED. 100 strip 3   insulin glargine (LANTUS) 100 UNIT/ML Solostar Pen Inject 30-40 Units into the skin 2 (two) times daily. 90 mL 1   Lancets (ONETOUCH DELICA PLUS LANCET33G) MISC USE UP TO FOUR TIMES DAILY AS DIRECTED 100 each 11   loratadine (CLARITIN) 10 MG tablet Take 10 mg by mouth daily as needed for allergies.     metoprolol tartrate (LOPRESSOR) 25 MG tablet Take 0.5 tablets (12.5 mg total) by mouth 2 (two) times daily. 90 tablet 3   nitroGLYCERIN (NITROSTAT) 0.4 MG SL tablet Place 0.4 mg under the tongue every 5 (five) minutes as needed for chest pain.     olmesartan (BENICAR) 5 MG tablet Take 5 mg by mouth daily.     ondansetron (ZOFRAN) 8 MG tablet Take 1 tablet (8 mg total) by mouth every 8 (eight) hours as needed for nausea or vomiting. 30 tablet 3   polyethylene glycol powder (GLYCOLAX/MIRALAX) 17 GM/SCOOP powder Take 1 Container by mouth daily.     rosuvastatin (CRESTOR) 20 MG tablet Take 1 tablet (20 mg total) by mouth at bedtime. 90 tablet 3   traZODone (DESYREL) 50 MG  tablet Take 0.5-1 tablets (25-50 mg total) by mouth at bedtime as needed for sleep. 90 tablet 1   Bismuth 262 MG CHEW Chew 524 mg by mouth in the morning, at noon, in the evening, and at bedtime. 112 tablet 0   No current facility-administered medications for this visit.    Allergies:   Cymbalta [duloxetine hcl], Codeine, Lisinopril, Nsaids, Dapagliflozin, and Latex    Social History:  The patient  reports that he quit smoking about 14 months ago. His smoking use included cigarettes. He started smoking about 55 years ago. He has a 13.5 pack-year smoking history. He has never used smokeless tobacco. He reports that he does not drink alcohol and does not use drugs.   Family History:  The patient's family history includes Asthma in his brother; Depression in his mother; Diabetes in his brother and sister; Early death in his mother; Heart attack in his father; Heart disease in his father; Hypertension in his father; Kidney disease in his mother; Ovarian cancer in his mother; Stroke in an other family member.    ROS:  Please see the history of present illness.   Otherwise, review of systems are positive for none.   All other systems are reviewed and negative.    PHYSICAL EXAM: VS:  BP (!) 126/58 (BP Location: Left Arm, Patient Position: Sitting, Cuff Size: Normal)   Pulse (!) 44   Ht 5\' 8"  (1.727 m)   Wt 165 lb 6.4 oz (75 kg)   SpO2 99%   BMI 25.15 kg/m  , BMI Body mass index is 25.15 kg/m. GEN: Well nourished, well developed, in no acute distress  HEENT: normal  Neck: no JVD,  or masses .  Bilateral carotid bruits. Cardiac: RRR; no murmurs, rubs, or gallops,no edema  Respiratory:  clear to auscultation bilaterally, normal work of breathing GI: soft, nontender, nondistended, + BS MS: no deformity or atrophy  Skin: warm and dry, no rash Neuro:  Strength and sensation are intact Psych: euthymic mood, full affect  Vascular: Distal pulses are not palpable.  EKG:  EKG is ordered  today. EKG showed:  Marked sinus bradycardia Left axis deviation Septal infarct , age undetermined When compared with ECG of 31-May-2023 08:22, Vent. rate has decreased BY  25 BPM Septal infarct is  now Present QT has shortened    Recent Labs: 03/18/2023: Magnesium 2.0 08/26/2023: ALT 21; BUN 17; Creatinine 1.46; Hemoglobin 13.4; Platelet Count 177; Potassium 4.3; Sodium 143    Lipid Panel    Component Value Date/Time   CHOL 123 03/17/2023 0130   CHOL 120 08/27/2022 1429   TRIG 172 (H) 03/17/2023 0130   HDL 44 03/17/2023 0130   HDL 60 08/27/2022 1429   CHOLHDL 2.8 03/17/2023 0130   VLDL 34 03/17/2023 0130   LDLCALC 45 03/17/2023 0130   LDLCALC 41 08/27/2022 1429      Wt Readings from Last 3 Encounters:  09/06/23 165 lb 6.4 oz (75 kg)  08/26/23 168 lb (76.2 kg)  07/15/23 166 lb (75.3 kg)           11/30/2016    7:59 AM  PAD Screen  Previous PAD dx? Yes  Previous surgical procedure? No  Pain with walking? Yes  Subsides with rest? No  Feet/toe relief with dangling? Yes  Painful, non-healing ulcers? No  Extremities discolored? No      ASSESSMENT AND PLAN:  1.  Peripheral arterial disease: Status post  stent placement to the left common iliac artery for subtotal occlusion.   Most recent Doppler showed patent stent.  Continue medical therapy.  I requested follow-up Doppler studies to be done in July.  He does report left calf claudication but he is also limited by neuropathy symptoms.  2. Coronary artery disease with other forms of angina: Abnormal cardiac PET scan.  His angina resolved once his acidemia was treated.  Continue medical therapy for now.  3. Tobacco use: He quit smoking in January of last year  4. Carotid disease: Most recent carotid Doppler showed stable moderate bilateral disease.  I requested follow-up Doppler to be done in July  5. Hyperlipidemia: Most recent Doppler showed an LDL of 41.  Continue rosuvastatin and ezetimibe.  6.  Persistent  atrial fibrillation: Maintaining in sinus rhythm with dofetilide.  On long-term anticoagulation with Eliquis.  Given his bradycardia, I elected to switch metoprolol to carvedilol 3.125 mg twice daily.   Disposition:   FU with me in 6 month.   Signed,  Lorine Bears, MD  09/06/2023 8:35 AM    [facility]

## 2023-09-08 ENCOUNTER — Telehealth: Payer: Self-pay | Admitting: Hematology & Oncology

## 2023-09-08 NOTE — Telephone Encounter (Signed)
 Per inbakset message pt needs 2 doses of Venofer (2). Called and lvm for the pt.

## 2023-09-12 ENCOUNTER — Encounter: Payer: Self-pay | Admitting: Family Medicine

## 2023-09-12 ENCOUNTER — Ambulatory Visit: Payer: 59 | Admitting: Family Medicine

## 2023-09-12 VITALS — BP 124/64 | HR 52 | Temp 98.2°F | Wt 163.4 lb

## 2023-09-12 DIAGNOSIS — E782 Mixed hyperlipidemia: Secondary | ICD-10-CM

## 2023-09-12 DIAGNOSIS — I252 Old myocardial infarction: Secondary | ICD-10-CM

## 2023-09-12 DIAGNOSIS — Z8673 Personal history of transient ischemic attack (TIA), and cerebral infarction without residual deficits: Secondary | ICD-10-CM

## 2023-09-12 DIAGNOSIS — Z794 Long term (current) use of insulin: Secondary | ICD-10-CM | POA: Diagnosis not present

## 2023-09-12 DIAGNOSIS — I779 Disorder of arteries and arterioles, unspecified: Secondary | ICD-10-CM

## 2023-09-12 DIAGNOSIS — I4819 Other persistent atrial fibrillation: Secondary | ICD-10-CM

## 2023-09-12 DIAGNOSIS — E1149 Type 2 diabetes mellitus with other diabetic neurological complication: Secondary | ICD-10-CM

## 2023-09-12 DIAGNOSIS — I4892 Unspecified atrial flutter: Secondary | ICD-10-CM

## 2023-09-12 DIAGNOSIS — E118 Type 2 diabetes mellitus with unspecified complications: Secondary | ICD-10-CM

## 2023-09-12 DIAGNOSIS — I739 Peripheral vascular disease, unspecified: Secondary | ICD-10-CM

## 2023-09-12 DIAGNOSIS — I4729 Other ventricular tachycardia: Secondary | ICD-10-CM

## 2023-09-12 DIAGNOSIS — N1831 Chronic kidney disease, stage 3a: Secondary | ICD-10-CM | POA: Diagnosis not present

## 2023-09-12 DIAGNOSIS — C9000 Multiple myeloma not having achieved remission: Secondary | ICD-10-CM | POA: Diagnosis not present

## 2023-09-12 DIAGNOSIS — D500 Iron deficiency anemia secondary to blood loss (chronic): Secondary | ICD-10-CM

## 2023-09-12 DIAGNOSIS — Z951 Presence of aortocoronary bypass graft: Secondary | ICD-10-CM

## 2023-09-12 DIAGNOSIS — K921 Melena: Secondary | ICD-10-CM

## 2023-09-12 DIAGNOSIS — I10 Essential (primary) hypertension: Secondary | ICD-10-CM | POA: Diagnosis not present

## 2023-09-12 LAB — POCT GLYCOSYLATED HEMOGLOBIN (HGB A1C)
HbA1c POC (<> result, manual entry): 8 % (ref 4.0–5.6)
HbA1c, POC (controlled diabetic range): 8 % — AB (ref 0.0–7.0)
HbA1c, POC (prediabetic range): 8 % — AB (ref 5.7–6.4)
Hemoglobin A1C: 8 % — AB (ref 4.0–5.6)

## 2023-09-12 LAB — MICROALBUMIN / CREATININE URINE RATIO
Creatinine,U: 189 mg/dL
Microalb Creat Ratio: 107.4 mg/g — ABNORMAL HIGH (ref 0.0–30.0)
Microalb, Ur: 20.3 mg/dL — ABNORMAL HIGH (ref 0.0–1.9)

## 2023-09-12 MED ORDER — ROSUVASTATIN CALCIUM 20 MG PO TABS: 20.0000 mg | ORAL_TABLET | Freq: Every day | ORAL | 3 refills | Status: DC

## 2023-09-12 MED ORDER — APIXABAN 5 MG PO TABS: 5.0000 mg | ORAL_TABLET | Freq: Two times a day (BID) | ORAL | 0 refills | Status: DC

## 2023-09-12 MED ORDER — EZETIMIBE 10 MG PO TABS: 10.0000 mg | ORAL_TABLET | Freq: Every day | ORAL | 3 refills | Status: DC

## 2023-09-12 MED ORDER — INSULIN GLARGINE 100 UNIT/ML SOLOSTAR PEN: PEN_INJECTOR | SUBCUTANEOUS | 1 refills | Status: DC

## 2023-09-12 MED ORDER — TRAZODONE HCL 50 MG PO TABS: 50.0000 mg | ORAL_TABLET | Freq: Every evening | ORAL | 1 refills | Status: DC | PRN

## 2023-09-12 NOTE — Patient Instructions (Addendum)
 Increase insulin to day dose of 42 units and night dose  35 units  Return in about 14 weeks (around 12/19/2023).        Great to see you today.  I have refilled the medication(s) we provide.   If labs were collected or images ordered, we will inform you of  results once we have received them and reviewed. We will contact you either by echart message, or telephone call.  Please give ample time to the testing facility, and our office to run,  receive and review results. Please do not call inquiring of results, even if you can see them in your chart. We will contact you as soon as we are able. If it has been over 1 week since the test was completed, and you have not yet heard from [place], then please call us.    - echart message- for normal results that have been seen by the patient already.   - telephone call: abnormal results or if patient has not viewed results in their echart.  If a referral to a specialist was entered for you, please call us in 2 weeks if you have not heard from the specialist office to schedule.

## 2023-09-12 NOTE — Progress Notes (Signed)
 Patient ID: Marvin Salinas, male  DOB: 05/06/1946, 78 y.o.   MRN: [id number] Patient Care Team    Relationship Specialty Notifications Start End  Natalia Leatherwood, DO PCP - General Family Medicine  01/24/19   Coralyn Helling, MD (Inactive) Consulting Physician Pulmonary Disease  01/26/19   [name], Deloris Ping, MD Consulting Physician Cardiology  04/30/20   Rachael Fee, MD (Inactive) Attending Physician Gastroenterology  10/23/20   Erenest Blank, NP Nurse Practitioner Nurse Practitioner  10/23/20    Comment: Hematology  Van Clines, MD Consulting Physician Neurology  09/01/21   Randa Lynn, MD Consulting Physician Nephrology  10/28/21    Comment: [facility] [place] [facility]  [facility]    03/28/23   Iran Ouch, MD Consulting Physician Cardiology  03/28/23     Chief Complaint  Patient presents with   Diabetes    Pt is fasting.     Subjective: Marvin Salinas is a 78 y.o.  male present for Chronic Conditions/illness Management Type 2 diabetes mellitus with stage 3 chronic kidney disease, with long-term current use of insulin (HCC) Pt reports compliance with Lantus  35 twice daily(usually) units  Patient denies dizziness, hyperglycemic or hypoglycemic events. Patient denies Patient denies dizziness, hyperglycemic or hypoglycemic events. Patient denies numbness, tingling in the extremities or nonhealing wounds of feet.    Atrial fibrillation with RVR (HCC)/Essential hypertension/Ischemic heart disease/ S/P CABG x 3/Paroxysmal atrial flutter (HCC)/PAD (peripheral artery disease) (HCC)/Nonsustained ventricular tachycardia (HCC)/HLD/NSTEMI/h/o stroke/CKD 3/chronic anticoagulation Patient has a history of severe 3-vessel coronary artery disease with chronic total occlusions of the proximal/mid LAD, mid/distal left circumflex/and proximal RCA.  Moderate caliber D1 and distal RPDA demonstrate 70-80% stenosis.  Moderately elevated left ventricular filling pressure.  Coronary artery  stents and CABG have been completed in the past.  He also has a history of cerebral ischemia and TIAs.  Pt reports compliance with  Crestor, Eliquis,  coreg, Zetia, tikosyn.  Patient denies chest pain, shortness of breath, dizziness or lower extremity edema.   Pt is prescribed statin. Diet: Heart healthy-low-sodium diet encouraged RF: Hypertension, hyperlipidemia, former smoker, STEMI, stroke, diabetes, family history, CAD, CVD, PAD, bilateral carotid artery stenosis, TIA   Lambda light chain myeloma: Established with oncology, Dr. Myna Hidalgo.  Prescribed Zofran, gabapentin and famciclovir  GI bleed 04/2023:Patient was found to have a hemoglobin of 6.1 during his hematology appointment.  He was admitted to the hospital and underwent EGD which showed white plaques in the esophagus, minimal gastritis, a few jejunal AVMs which were treated with argon plasma coagulation.  He was treated with IV PPI while hospitalized.  Baby aspirin was discontinued Eliquis was held temporarily and patient was told to restart.  He received 2 units on admission and hemoglobin increased to 8.2.  Oncology had started folate, iron stores were normal. Since hospital discharge patient reports patient reports he is feeling m    03/28/2023    8:36 AM 03/21/2023    1:07 PM 10/11/2022    8:20 AM 03/31/2022   11:12 AM 03/19/2022    2:00 PM  Depression screen PHQ 2/9  Decreased Interest 1 0 1 0 0  Down, Depressed, Hopeless 1 0 1 0 0  PHQ - 2 Score 2 0 2 0 0  Altered sleeping 1  1    Tired, decreased energy 1  1    Change in appetite 0  0    Feeling bad or failure about yourself  0  0    Trouble concentrating 0  0    Moving slowly or fidgety/restless 0  0    Suicidal thoughts 0  0    PHQ-9 Score 4  4    Difficult doing work/chores Not difficult at all  Somewhat difficult        03/28/2023    8:36 AM 10/28/2021    9:13 AM  GAD 7 : Generalized Anxiety Score  Nervous, Anxious, on Edge 0 0  Control/stop worrying 1 1  Worry  too much - different things 1 1  Trouble relaxing 1 1  Restless 1 1  Easily annoyed or irritable 0 0  Afraid - awful might happen 0 0  Total GAD 7 Score 4 4  Anxiety Difficulty Not difficult at all           09/08/2023    9:04 AM 03/28/2023    8:35 AM 03/21/2023    9:22 AM 10/11/2022    8:20 AM 06/26/2022    9:16 AM  Fall Risk   Falls in the past year? 0 0 0 1 1  Number falls in past yr:  0 0 1 0  Injury with Fall?  0 0 0 0  Risk for fall due to :   No Fall Risks    Follow up  Falls evaluation completed Falls evaluation completed Falls evaluation completed     Immunization History  Administered Date(s) Administered   Fluad Quad(high Dose 65+) 02/12/2019, 04/29/2020, 04/22/2021, 06/09/2022   Fluzone Influenza virus vaccine,trivalent (IIV3), split virus 04/21/2016   Influenza Split 05/08/2013, 03/28/2017   Influenza, High Dose Seasonal PF 05/08/2013, 03/28/2017, 05/02/2018, 05/12/2023   Influenza,inj,Quad PF,6+ Mos 04/21/2016   MODERNA COVID-19 SARS-COV-2 PEDS BIVALENT BOOSTER 75yr-100yr 06/18/2020, 12/10/2020   Moderna SARS-COV2 Booster Vaccination 12/10/2020   Moderna Sars-Covid-2 Vaccination 08/27/2019, 10/03/2019   Pneumococcal Conjugate-13 07/13/2016   Pneumococcal Polysaccharide-23 02/21/2018   Tdap 02/15/2013   Zoster, Live 06/22/2015    No results found.  Past Medical History:  Diagnosis Date   Acute kidney injury superimposed on chronic kidney disease (HCC) 10/12/2017   Atrial fibrillation with RVR (HCC) 10/27/2017   Back pain    Basilar artery stenosis    on chronic Plavix   Benign prostatic hypertrophy without urinary obstruction 04/17/2014   Carotid artery disease (HCC) 10/06/2010   Carotid US 04/2019: Bilat ICA 40-59; L subclavian stenosis  // Carotid US 11/21: Bilat ICA 40-59; bilateral subclavian stenosis // Carotid US 11/22: Bilateral ICA 40-59; left subclavian stenosis // Carotid US 04/28/2022: Bilateral ICA 40-59; left subclavian stenosis   Cerebral  vascular disease    with prior TIA's; followed by Dr. Sandria Manly   Depression, neurotic 11/21/2013   Diabetes mellitus    on insulin   Enthesopathy of ankle and tarsus 09/04/2007   Overview:  Metatarsalgia    Enthesopathy of ankle and tarsus 09/04/2007   Overview:  Metatarsalgia  Formatting of this note might be different from the original. Metatarsalgia  10/1 IMO update   History of renal calculi    Hyperlipidemia    Hypertension    Ischemic heart disease    prior PCI to RCA in 1989. S/P PCI to LAD and OM in 1992. S/P PCI to first DX in 2000. S/P CABG x 3 in May 2011   Lambda light chain myeloma (HCC) 10/28/2021   Left hip pain 01/03/2020   OSA (obstructive sleep apnea) 01/11/2018   AHI 18.1 and SaO2 low 73%   PAD (peripheral artery disease) (HCC)  11/18/2017   ABIs/Arterial US 04/2019: R 1.21; L 0.66 // R SFA 30-49, stable > 50 CIA and EIA stenosis; L > 50 CIA stenosis (likely represents severe stenosis or short segment occlusion)   Peripheral neuropathy    Pneumonia 02/2011   Sacroiliac joint dysfunction of left side 01/03/2020   Stroke (HCC) 04/16/2001   small right cerebellar infarct on 04/16/2001 at that time he was found to have proximal left vertebral artery, proximal left common carotid artery and both external carotid artery stenosis as well as intracranial stenosis involving mid basilar artery- 07/2011 add questionable TIA   Allergies  Allergen Reactions   Cymbalta [Duloxetine Hcl] Other (See Comments)    Halucinations   Codeine Nausea And Vomiting   Lisinopril Cough   Nsaids Other (See Comments)    CKD   Dapagliflozin Rash   Latex Hives   Past Surgical History:  Procedure Laterality Date    NASAL ENDOSCOPY  01/11/2020   chronic rhinitis w/o evidence of acute sinusitis, bilateral inferior turbinate hypertrophy   ABDOMINAL AORTOGRAM W/LOWER EXTREMITY Bilateral 05/30/2019   Procedure: ABDOMINAL AORTOGRAM W/LOWER EXTREMITY;  Surgeon: Iran Ouch, MD;  Location: [facility]  INVASIVE CV LAB;  Service: Cardiovascular;  Laterality: Bilateral;   ANGIOPLASTY  1989   right coronary artery   ANGIOPLASTY  1992   LAD and OM   ANGIOPLASTY  1998   First DX   BIOPSY  05/06/2023   Procedure: BIOPSY;  Surgeon: Jenel Lucks, MD;  Location: [facility] ENDOSCOPY;  Service: Gastroenterology;;   BRAIN SURGERY     on prior records   CORONARY ARTERY BYPASS GRAFT  11/12/2009   LIMA to LAD, SVG to OM and SVG to RCA   CORONARY STENT PLACEMENT  2000   Stent to LAD/Circumflex with angioplasty to first diagonal    ENTEROSCOPY N/A 05/06/2023   Procedure: ENTEROSCOPY;  Surgeon: Jenel Lucks, MD;  Location: [facility];  Service: Gastroenterology;  Laterality: N/A;   HOT HEMOSTASIS N/A 05/06/2023   Procedure: HOT HEMOSTASIS (ARGON PLASMA COAGULATION/BICAP);  Surgeon: Jenel Lucks, MD;  Location: [facility];  Service: Gastroenterology;  Laterality: N/A;   LEFT HEART CATH AND CORS/GRAFTS ANGIOGRAPHY N/A 10/13/2017   Procedure: LEFT HEART CATH AND CORS/GRAFTS ANGIOGRAPHY;  Surgeon: Yvonne Kendall, MD;  Location: [facility] INVASIVE CV LAB;  Service: Cardiovascular;  Laterality: N/A;   SUBMUCOSAL TATTOO INJECTION  05/06/2023   Procedure: SUBMUCOSAL TATTOO INJECTION;  Surgeon: Jenel Lucks, MD;  Location: [facility];  Service: Gastroenterology;;   Family History  Problem Relation Age of Onset   Depression Mother    Early death Mother    Kidney disease Mother    Ovarian cancer Mother    Hypertension Father    Heart disease Father    Heart attack Father    Asthma Brother    Diabetes Brother    Stroke Other        Uncle   Diabetes Sister    Colon cancer Neg Hx    Rectal cancer Neg Hx    Stomach cancer Neg Hx    Esophageal cancer Neg Hx    Social History   Social History Narrative   Marital status/children/pets: divorced.   Education/employment: retired, Patient has his GED. 9th grade education.    Safety:      -smoke alarm in the home:Yes     - wears  seatbelt: Yes   Patient is right handed.   Patient does not drink any caffeine.   Lives in a one  story home     Allergies as of 09/12/2023       Reactions   Cymbalta [duloxetine Hcl] Other (See Comments)   Halucinations   Codeine Nausea And Vomiting   Lisinopril Cough   Nsaids Other (See Comments)   CKD   Dapagliflozin Rash   Latex Hives        Medication List        Accurate as of September 12, 2023  9:52 AM. If you have any questions, ask your nurse or doctor.          STOP taking these medications    Bismuth 262 MG Chew Stopped by: Felix Pacini   olmesartan 5 MG tablet Commonly known as: BENICAR Stopped by: Felix Pacini   polyethylene glycol powder 17 GM/SCOOP powder Commonly known as: GLYCOLAX/MIRALAX Stopped by: Felix Pacini       TAKE these medications    acetaminophen 500 MG tablet Commonly known as: TYLENOL Take 1,000 mg by mouth every 6 (six) hours as needed for mild pain (pain score 1-3) or moderate pain (pain score 4-6).   apixaban 5 MG Tabs tablet Commonly known as: Eliquis Take 1 tablet (5 mg total) by mouth 2 (two) times daily. Further refills from cardio   BD Pen Needle Nano 2nd Gen 32G X 4 MM Misc Generic drug: Insulin Pen Needle Place 1 Needle onto the skin in the morning and at bedtime.   carvedilol 3.125 MG tablet Commonly known as: COREG Take 1 tablet (3.125 mg total) by mouth 2 (two) times daily.   dofetilide 125 MCG capsule Commonly known as: TIKOSYN Take 1 capsule (125 mcg total) by mouth 2 (two) times daily.   ezetimibe 10 MG tablet Commonly known as: ZETIA Take 1 tablet (10 mg total) by mouth daily.   famciclovir 250 MG tablet Commonly known as: FAMVIR Take 250 mg by mouth daily.   gabapentin 300 MG capsule Commonly known as: NEURONTIN Take 1 capsule (300 mg total) by mouth 3 (three) times daily.   GENTEAL OP Place 1 drop into both eyes daily as needed (dry eyes).   insulin glargine 100 UNIT/ML Solostar  Pen Commonly known as: LANTUS 42 units in morning and 35 units at night What changed:  how much to take how to take this when to take this additional instructions Changed by: Felix Pacini   loratadine 10 MG tablet Commonly known as: CLARITIN Take 10 mg by mouth daily as needed for allergies.   nitroGLYCERIN 0.4 MG SL tablet Commonly known as: NITROSTAT Place 0.4 mg under the tongue every 5 (five) minutes as needed for chest pain.   ondansetron 8 MG tablet Commonly known as: ZOFRAN Take 1 tablet (8 mg total) by mouth every 8 (eight) hours as needed for nausea or vomiting.   OneTouch Delica Plus Lancet33G Misc USE UP TO FOUR TIMES DAILY AS DIRECTED   OneTouch Verio test strip Generic drug: glucose blood USE UP TO FOUR TIMES DAILY AS DIRECTED.   rosuvastatin 20 MG tablet Commonly known as: CRESTOR Take 1 tablet (20 mg total) by mouth at bedtime.   traZODone 50 MG tablet Commonly known as: DESYREL Take 1 tablet (50 mg total) by mouth at bedtime as needed for sleep. What changed: how much to take Changed by: Felix Pacini        All past medical history, surgical history, allergies, family history, immunizations andmedications were updated in the EMR today and reviewed under the history and medication portions of their EMR.  ROS: 14 pt review of systems performed and negative (unless mentioned in an HPI)  Objective: BP 124/64   Pulse (!) 52   Temp 98.2 F (36.8 C)   Wt 163 lb 6.4 oz (74.1 kg)   SpO2 98%   BMI 24.84 kg/m  Physical Exam Vitals and nursing note reviewed.  Constitutional:      General: He is not in acute distress.    Appearance: Normal appearance. He is not ill-appearing, toxic-appearing or diaphoretic.  HENT:     Head: Normocephalic and atraumatic.  Eyes:     General: No scleral icterus.       Right eye: No discharge.        Left eye: No discharge.     Extraocular Movements: Extraocular movements intact.     Pupils: Pupils are equal,  round, and reactive to light.  Cardiovascular:     Rate and Rhythm: Normal rate and regular rhythm.     Heart sounds: No murmur heard. Pulmonary:     Effort: Pulmonary effort is normal. No respiratory distress.     Breath sounds: Normal breath sounds. No wheezing, rhonchi or rales.  Musculoskeletal:     Cervical back: Neck supple.     Right lower leg: No edema.     Left lower leg: No edema.  Skin:    General: Skin is warm and dry.     Coloration: Skin is not jaundiced or pale.     Findings: No rash.  Neurological:     Mental Status: He is alert and oriented to person, place, and time. Mental status is at baseline.  Psychiatric:        Mood and Affect: Mood normal.        Behavior: Behavior normal.        Thought Content: Thought content normal.        Judgment: Judgment normal.    Diabetic Foot Exam - Simple   Simple Foot Form Diabetic Foot exam was performed with the following findings: Yes 09/12/2023  9:47 AM  Visual Inspection No deformities, no ulcerations, no other skin breakdown bilaterally: Yes Sensation Testing Intact to touch and monofilament testing bilaterally: Yes Pulse Check Posterior Tibialis and Dorsalis pulse intact bilaterally: Yes Comments      Results for orders placed or performed in visit on 09/12/23 (from the past 24 hours)  POCT HgB A1C     Status: Abnormal   Collection Time: 09/12/23  9:26 AM  Result Value Ref Range   Hemoglobin A1C 8.0 (A) 4.0 - 5.6 %   HbA1c POC (<> result, manual entry) 8.0 4.0 - 5.6 %   HbA1c, POC (prediabetic range) 8.0 (A) 5.7 - 6.4 %   HbA1c, POC (controlled diabetic range) 8.0 (A) 0.0 - 7.0 %      Assessment/plan: LUIZ TRUMPOWER is a 78 y.o. male present for chronic condition management Type 2 diabetes mellitus with stage 3 chronic kidney disease, with long-term current use of insulin (HCC) -Patient was encouraged to follow a diabetic diet and make an effort to get routine exercise.  -increase Lantus to 42 units in  the a.m and 35u at night -consider wegovy start next visit if needed for cardiac/stroke and DM - Given his cardiac history and his CKD oral options are limited-insurance does not cover Jardiance.  Developed a rash with Farxiga. - Medicines tried: Ozempic (side effects), Farxiga (rash) - He declined nutrition referral - microalb collected today PNA series: Pneumonia series completed Flu shot:declined(recommneded yearly).  Foot exam: Referred to podiatry 12/2018-completed 09/12/2023 Eye exam: [facility] 05/2023 UTD A1c: 8.9>>> 13.7 (08/22/2018)>>11 (01/24/2019)>>10.8 08/20/2019> 10.5>10.3> 6.4 > 6.2 > 8.4 >7.6 today> 8.1(requiring chronic steroids for myeloma) > 8.8>7.0: (03/17/2023)>  > 8.0  Atrial fibrillation with RVR (HCC)/Essential hypertension/Ischemic heart disease/ S/P CABG x 3/Paroxysmal atrial flutter (HCC)/PAD (peripheral artery disease) (HCC)/Nonsustained ventricular tachycardia (HCC)/HLD/NSTEMI/h/o stroke/PAD stable - Patient aware goal is less than 130/80.   - Prescribed by cardio:coreg, tikosyn, Eliquis (recently changed from xarelto) -continue Crestor 20 mg daily  -Continue zeita. CBC, CMP and lipids up-to-date-reviewed in EMR from September and October 2024.   Chronic kidney disease (CKD), stage III (moderate) (HCC)/lambda light chain myeloma Renally dose meds. Avoid NSAIDs. PTH/calcium/vitamin D > by nephro  Referred to nephrology 2022.  -established Established with heme-onc for myeloma  Sleep disturbance: Uncomfortable from chemo SE and can not shut his mind down to relax.  continue trazodone 50 mg at bedtime to help sleep PRN.    Return in about 14 weeks (around 12/19/2023).  Orders Placed This Encounter  Procedures   Urine Albumin/Creatinine with ratio (send out) [LAB689]   POCT HgB A1C    Meds ordered this encounter  Medications   traZODone (DESYREL) 50 MG tablet    Sig: Take 1 tablet (50 mg total) by mouth at bedtime as needed for sleep.    Dispense:   90 tablet    Refill:  1   rosuvastatin (CRESTOR) 20 MG tablet    Sig: Take 1 tablet (20 mg total) by mouth at bedtime.    Dispense:  90 tablet    Refill:  3   insulin glargine (LANTUS) 100 UNIT/ML Solostar Pen    Sig: 42 units in morning and 35 units at night    Dispense:  90 mL    Refill:  1   ezetimibe (ZETIA) 10 MG tablet    Sig: Take 1 tablet (10 mg total) by mouth daily.    Dispense:  90 tablet    Refill:  3   apixaban (ELIQUIS) 5 MG TABS tablet    Sig: Take 1 tablet (5 mg total) by mouth 2 (two) times daily. Further refills from cardio    Dispense:  180 tablet    Refill:  0    90 day supply    Referral Orders  No referral(s) requested today     Note is dictated utilizing voice recognition software. Although note has been proof read prior to signing, occasional typographical errors still can be missed. If any questions arise, please do not hesitate to call for verification.  Electronically signed by: Felix Pacini, DO [facility]

## 2023-09-13 ENCOUNTER — Telehealth: Payer: Self-pay | Admitting: Family Medicine

## 2023-09-13 MED ORDER — EMPAGLIFLOZIN 10 MG PO TABS: 10.0000 mg | ORAL_TABLET | Freq: Every day | ORAL | 5 refills | Status: DC

## 2023-09-13 NOTE — Telephone Encounter (Signed)
 Please call patient Marvin urine protein levels are elevated. Marvin known kidney disorder of the lambda light chain myeloma are possibly part of the elevation we are seeing.  However since Marvin diabetes was not at goal, and the medication called [place] Pepper is used to protect kidneys and help with protein in the urine, if affordable/covered through insurance this medication would benefit him greatly from 2 levels.  It also has cardiovascular protection that is commonly used in patients who had heart attacks in the past.  I called in the Jardiance 10 mg daily dosing.  We will likely have to perform a prior authorization for this medication.   -Only if he asks, yes we tried calling this medication a few years ago and it was not covered, but formularies frequently change.  If medication is denied please send referral to food pharmacy team further assistance.  He developed a rash with Marcelline Deist, therefore if it is preferred we cannot use that again.   Lastly, only if London Pepper is approved and he starts the Jardiance 10 mg daily, then I want him to go back to taking the Lantus 35 units twice a day and not increase as we discussed during Marvin visit yesterday.  Again, please make sure he understands, he is to follow the instructions in yesterday's visit only increase in Lantus unless London Pepper is approved, then he goes back to what he was doing before and taking [place]

## 2023-09-14 ENCOUNTER — Inpatient Hospital Stay

## 2023-09-14 VITALS — BP 179/53 | HR 54 | Temp 97.4°F | Resp 19

## 2023-09-14 DIAGNOSIS — D500 Iron deficiency anemia secondary to blood loss (chronic): Secondary | ICD-10-CM | POA: Diagnosis not present

## 2023-09-14 DIAGNOSIS — N189 Chronic kidney disease, unspecified: Secondary | ICD-10-CM | POA: Diagnosis not present

## 2023-09-14 DIAGNOSIS — Z79899 Other long term (current) drug therapy: Secondary | ICD-10-CM | POA: Diagnosis not present

## 2023-09-14 DIAGNOSIS — D631 Anemia in chronic kidney disease: Secondary | ICD-10-CM | POA: Diagnosis not present

## 2023-09-14 DIAGNOSIS — Z9221 Personal history of antineoplastic chemotherapy: Secondary | ICD-10-CM | POA: Diagnosis not present

## 2023-09-14 DIAGNOSIS — K922 Gastrointestinal hemorrhage, unspecified: Secondary | ICD-10-CM | POA: Diagnosis not present

## 2023-09-14 DIAGNOSIS — C9001 Multiple myeloma in remission: Secondary | ICD-10-CM | POA: Diagnosis not present

## 2023-09-14 MED ORDER — DIPHENHYDRAMINE HCL 25 MG PO CAPS
25.0000 mg | ORAL_CAPSULE | Freq: Once | ORAL | Status: AC
  Administered 2023-09-14: 25 mg via ORAL
  Filled 2023-09-14: qty 1

## 2023-09-14 MED ORDER — ACETAMINOPHEN 325 MG PO TABS
650.0000 mg | ORAL_TABLET | Freq: Once | ORAL | Status: AC
  Administered 2023-09-14: 650 mg via ORAL
  Filled 2023-09-14: qty 2

## 2023-09-14 MED ORDER — SODIUM CHLORIDE 0.9 % IV SOLN: Freq: Once | INTRAVENOUS | Status: AC

## 2023-09-14 MED ORDER — IRON SUCROSE 20 MG/ML IV SOLN
200.0000 mg | Freq: Once | INTRAVENOUS | Status: AC
  Administered 2023-09-14: 200 mg via INTRAVENOUS
  Filled 2023-09-14: qty 10

## 2023-09-14 NOTE — Patient Instructions (Signed)

## 2023-09-21 DIAGNOSIS — L570 Actinic keratosis: Secondary | ICD-10-CM | POA: Diagnosis not present

## 2023-09-21 DIAGNOSIS — C44329 Squamous cell carcinoma of skin of other parts of face: Secondary | ICD-10-CM | POA: Diagnosis not present

## 2023-09-22 ENCOUNTER — Inpatient Hospital Stay: Attending: Family

## 2023-09-22 VITALS — BP 155/63 | HR 59 | Temp 98.3°F | Resp 18

## 2023-09-22 DIAGNOSIS — K922 Gastrointestinal hemorrhage, unspecified: Secondary | ICD-10-CM | POA: Diagnosis not present

## 2023-09-22 DIAGNOSIS — D500 Iron deficiency anemia secondary to blood loss (chronic): Secondary | ICD-10-CM | POA: Insufficient documentation

## 2023-09-22 MED ORDER — ACETAMINOPHEN 325 MG PO TABS: 650.0000 mg | ORAL_TABLET | Freq: Once | ORAL | Status: DC

## 2023-09-22 MED ORDER — SODIUM CHLORIDE 0.9 % IV SOLN: Freq: Once | INTRAVENOUS | Status: AC

## 2023-09-22 MED ORDER — DARBEPOETIN ALFA 300 MCG/0.6ML IJ SOSY: 300.0000 ug | PREFILLED_SYRINGE | Freq: Once | INTRAMUSCULAR | Status: DC

## 2023-09-22 MED ORDER — IRON SUCROSE 20 MG/ML IV SOLN
200.0000 mg | Freq: Once | INTRAVENOUS | Status: AC
  Administered 2023-09-22: 200 mg via INTRAVENOUS
  Filled 2023-09-22: qty 10

## 2023-09-22 MED ORDER — DIPHENHYDRAMINE HCL 25 MG PO CAPS: 25.0000 mg | ORAL_CAPSULE | Freq: Once | ORAL | Status: DC

## 2023-09-22 NOTE — Patient Instructions (Signed)

## 2023-10-05 ENCOUNTER — Other Ambulatory Visit: Payer: Self-pay | Admitting: Hematology & Oncology

## 2023-10-05 DIAGNOSIS — Z08 Encounter for follow-up examination after completed treatment for malignant neoplasm: Secondary | ICD-10-CM | POA: Diagnosis not present

## 2023-10-05 DIAGNOSIS — L570 Actinic keratosis: Secondary | ICD-10-CM | POA: Diagnosis not present

## 2023-10-05 DIAGNOSIS — C9000 Multiple myeloma not having achieved remission: Secondary | ICD-10-CM

## 2023-10-05 DIAGNOSIS — L578 Other skin changes due to chronic exposure to nonionizing radiation: Secondary | ICD-10-CM | POA: Diagnosis not present

## 2023-10-05 DIAGNOSIS — L814 Other melanin hyperpigmentation: Secondary | ICD-10-CM | POA: Diagnosis not present

## 2023-10-05 DIAGNOSIS — L821 Other seborrheic keratosis: Secondary | ICD-10-CM | POA: Diagnosis not present

## 2023-10-05 DIAGNOSIS — D485 Neoplasm of uncertain behavior of skin: Secondary | ICD-10-CM | POA: Diagnosis not present

## 2023-10-05 DIAGNOSIS — Z85828 Personal history of other malignant neoplasm of skin: Secondary | ICD-10-CM | POA: Diagnosis not present

## 2023-10-05 DIAGNOSIS — D1801 Hemangioma of skin and subcutaneous tissue: Secondary | ICD-10-CM | POA: Diagnosis not present

## 2023-10-05 DIAGNOSIS — C44329 Squamous cell carcinoma of skin of other parts of face: Secondary | ICD-10-CM | POA: Diagnosis not present

## 2023-10-14 DIAGNOSIS — R809 Proteinuria, unspecified: Secondary | ICD-10-CM | POA: Diagnosis not present

## 2023-10-14 DIAGNOSIS — E211 Secondary hyperparathyroidism, not elsewhere classified: Secondary | ICD-10-CM | POA: Diagnosis not present

## 2023-10-14 DIAGNOSIS — E559 Vitamin D deficiency, unspecified: Secondary | ICD-10-CM | POA: Diagnosis not present

## 2023-10-14 DIAGNOSIS — D631 Anemia in chronic kidney disease: Secondary | ICD-10-CM | POA: Diagnosis not present

## 2023-10-14 DIAGNOSIS — N189 Chronic kidney disease, unspecified: Secondary | ICD-10-CM | POA: Diagnosis not present

## 2023-10-21 ENCOUNTER — Other Ambulatory Visit: Payer: Self-pay | Admitting: Cardiovascular Disease

## 2023-10-21 DIAGNOSIS — R809 Proteinuria, unspecified: Secondary | ICD-10-CM | POA: Diagnosis not present

## 2023-10-21 DIAGNOSIS — N1831 Chronic kidney disease, stage 3a: Secondary | ICD-10-CM | POA: Diagnosis not present

## 2023-10-21 DIAGNOSIS — E1129 Type 2 diabetes mellitus with other diabetic kidney complication: Secondary | ICD-10-CM | POA: Diagnosis not present

## 2023-10-21 DIAGNOSIS — E1122 Type 2 diabetes mellitus with diabetic chronic kidney disease: Secondary | ICD-10-CM | POA: Diagnosis not present

## 2023-10-24 ENCOUNTER — Other Ambulatory Visit: Payer: Self-pay

## 2023-10-24 DIAGNOSIS — E118 Type 2 diabetes mellitus with unspecified complications: Secondary | ICD-10-CM

## 2023-10-24 DIAGNOSIS — Z794 Long term (current) use of insulin: Secondary | ICD-10-CM

## 2023-10-24 MED ORDER — ONETOUCH DELICA PLUS LANCET33G MISC: 1 refills | Status: DC

## 2023-10-26 ENCOUNTER — Encounter: Payer: Self-pay | Admitting: Hematology & Oncology

## 2023-10-26 ENCOUNTER — Inpatient Hospital Stay

## 2023-10-26 ENCOUNTER — Inpatient Hospital Stay: Attending: Family | Admitting: Hematology & Oncology

## 2023-10-26 VITALS — BP 152/73 | HR 49 | Temp 97.6°F | Resp 20 | Ht 67.0 in | Wt 158.0 lb

## 2023-10-26 DIAGNOSIS — D500 Iron deficiency anemia secondary to blood loss (chronic): Secondary | ICD-10-CM | POA: Insufficient documentation

## 2023-10-26 DIAGNOSIS — N189 Chronic kidney disease, unspecified: Secondary | ICD-10-CM | POA: Diagnosis not present

## 2023-10-26 DIAGNOSIS — D631 Anemia in chronic kidney disease: Secondary | ICD-10-CM | POA: Insufficient documentation

## 2023-10-26 DIAGNOSIS — C9000 Multiple myeloma not having achieved remission: Secondary | ICD-10-CM

## 2023-10-26 DIAGNOSIS — K922 Gastrointestinal hemorrhage, unspecified: Secondary | ICD-10-CM | POA: Diagnosis not present

## 2023-10-26 LAB — CMP (CANCER CENTER ONLY)
ALT: 57 U/L — ABNORMAL HIGH (ref 0–44)
AST: 44 U/L — ABNORMAL HIGH (ref 15–41)
Albumin: 4.9 g/dL (ref 3.5–5.0)
Alkaline Phosphatase: 56 U/L (ref 38–126)
Anion gap: 8 (ref 5–15)
BUN: 23 mg/dL (ref 8–23)
CO2: 30 mmol/L (ref 22–32)
Calcium: 9.9 mg/dL (ref 8.9–10.3)
Chloride: 105 mmol/L (ref 98–111)
Creatinine: 1.62 mg/dL — ABNORMAL HIGH (ref 0.61–1.24)
GFR, Estimated: 43 mL/min — ABNORMAL LOW (ref >60)
Glucose, Bld: 114 mg/dL — ABNORMAL HIGH (ref 70–99)
Potassium: 4 mmol/L (ref 3.5–5.1)
Sodium: 143 mmol/L (ref 135–145)
Total Bilirubin: 0.7 mg/dL (ref 0.0–1.2)
Total Protein: 6.9 g/dL (ref 6.5–8.1)

## 2023-10-26 LAB — CBC WITH DIFFERENTIAL (CANCER CENTER ONLY)
Abs Immature Granulocytes: 0.01 10*3/uL (ref 0.00–0.07)
Basophils Absolute: 0 10*3/uL (ref 0.0–0.1)
Basophils Relative: 1 %
Eosinophils Absolute: 0.1 10*3/uL (ref 0.0–0.5)
Eosinophils Relative: 1 %
HCT: 46 % (ref 39.0–52.0)
Hemoglobin: 15.8 g/dL (ref 13.0–17.0)
Immature Granulocytes: 0 %
Lymphocytes Relative: 23 %
Lymphs Abs: 1.7 10*3/uL (ref 0.7–4.0)
MCH: 29.8 pg (ref 26.0–34.0)
MCHC: 34.3 g/dL (ref 30.0–36.0)
MCV: 86.6 fL (ref 80.0–100.0)
Monocytes Absolute: 0.6 10*3/uL (ref 0.1–1.0)
Monocytes Relative: 8 %
Neutro Abs: 4.9 10*3/uL (ref 1.7–7.7)
Neutrophils Relative %: 67 %
Platelet Count: 193 10*3/uL (ref 150–400)
RBC: 5.31 MIL/uL (ref 4.22–5.81)
RDW: 14.5 % (ref 11.5–15.5)
WBC Count: 7.4 10*3/uL (ref 4.0–10.5)
nRBC: 0 % (ref 0.0–0.2)

## 2023-10-26 LAB — LACTATE DEHYDROGENASE: LDH: 198 U/L — ABNORMAL HIGH (ref 98–192)

## 2023-10-26 NOTE — Progress Notes (Signed)
 Hematology and Oncology Follow Up Visit  Marvin Salinas [id number] 1946/06/11 78 y.o. 10/26/2023   Principle Diagnosis:  Lambda light chain myeloma --normal cytogenetics Anemia of renal sufficiency Iron  deficiency anemia secondary to GI bleed   Current Therapy:        Faspro/Velcade /Revlimid  -- started on 11/05/2021 - monthly -- revlimid  stopped in 09/2022.  All therapy stopped on 02/11/2023 Aranesp  300 mcg subcu for hemoglobin less than 11 Venofer  200 mg IV given on 09/22/2023    Interim History:  Mr. Hammerschmidt is here today for follow-up.  So far, he is doing quite nicely.  He has been off treatment now for about 10 months.  So far, the light chain myeloma really has not been a problem.  Less than that we saw him, the light chain was 1.4 mg/dL.  He has had no issues with nausea or vomiting.  He has had no problems with bowels or bladder.  There is been no melena or bright red blood per rectum.  His appetite is doing okay.  He has lost a little bit of weight.  This may be from medications that he is on.  He has had no rashes.  He has had no leg swelling.  He got iron  less than that we saw him.  His iron  saturation was 16%.  Overall, I would say his performance status is probably ECOG 2.   Medications:  Allergies as of 10/26/2023       Reactions   Cymbalta  [duloxetine  Hcl] Other (See Comments)   Halucinations   Codeine Nausea And Vomiting   Lisinopril Cough   Nsaids Other (See Comments)   CKD   Dapagliflozin Rash   Latex Hives        Medication List        Accurate as of Oct 26, 2023 11:30 AM. If you have any questions, ask your nurse or doctor.          STOP taking these medications    loratadine  10 MG tablet Commonly known as: CLARITIN  Stopped by: Ivor Mars       TAKE these medications    acetaminophen  500 MG tablet Commonly known as: TYLENOL  Take 1,000 mg by mouth every 6 (six) hours as needed for mild pain (pain score 1-3) or moderate pain (pain  score 4-6).   apixaban  5 MG Tabs tablet Commonly known as: Eliquis  Take 1 tablet (5 mg total) by mouth 2 (two) times daily. Further refills from cardio   BD Pen Needle Nano 2nd Gen 32G X 4 MM Misc Generic drug: Insulin  Pen Needle Place 1 Needle onto the skin in the morning and at bedtime.   carvedilol  3.125 MG tablet Commonly known as: COREG  Take 1 tablet (3.125 mg total) by mouth 2 (two) times daily.   diphenhydrAMINE  25 mg capsule Commonly known as: BENADRYL  Take 25 mg by mouth every 6 (six) hours as needed.   dofetilide  125 MCG capsule Commonly known as: TIKOSYN  TAKE 1 CAPSULE BY MOUTH TWICE A DAY   empagliflozin  10 MG Tabs tablet Commonly known as: Jardiance  Take 1 tablet (10 mg total) by mouth daily before breakfast.   ezetimibe  10 MG tablet Commonly known as: ZETIA  Take 1 tablet (10 mg total) by mouth daily.   famciclovir  250 MG tablet Commonly known as: FAMVIR  Take 250 mg by mouth daily.   gabapentin  300 MG capsule Commonly known as: NEURONTIN  Take 1 capsule (300 mg total) by mouth 3 (three) times daily.   GENTEAL OP Place  1 drop into both eyes daily as needed (dry eyes).   insulin  glargine 100 UNIT/ML Solostar Pen Commonly known as: LANTUS  42 units in morning and 35 units at night What changed: additional instructions   nitroGLYCERIN  0.4 MG SL tablet Commonly known as: NITROSTAT  Place 0.4 mg under the tongue every 5 (five) minutes as needed for chest pain.   ondansetron  8 MG tablet Commonly known as: ZOFRAN  TAKE 1 TABLET BY MOUTH EVERY 8 HOURS AS NEEDED FOR NAUSEA OR VOMITING   OneTouch Delica Plus Lancet33G Misc USE UP TO FOUR TIMES DAILY AS DIRECTED   OneTouch Verio test strip Generic drug: glucose blood USE UP TO FOUR TIMES DAILY AS DIRECTED.   rosuvastatin  20 MG tablet Commonly known as: CRESTOR  Take 1 tablet (20 mg total) by mouth at bedtime.   traZODone  50 MG tablet Commonly known as: DESYREL  Take 1 tablet (50 mg total) by mouth at  bedtime as needed for sleep.        Allergies:  Allergies  Allergen Reactions   Cymbalta  [Duloxetine  Hcl] Other (See Comments)    Halucinations   Codeine Nausea And Vomiting   Lisinopril Cough   Nsaids Other (See Comments)    CKD   Dapagliflozin Rash   Latex Hives    Past Medical History, Surgical history, Social history, and Family History were reviewed and updated.  Review of Systems: Review of Systems  Constitutional: Negative.   HENT: Negative.    Eyes: Negative.   Respiratory: Negative.    Cardiovascular: Negative.   Gastrointestinal: Negative.   Genitourinary: Negative.   Musculoskeletal: Negative.   Skin: Negative.   Neurological: Negative.   Endo/Heme/Allergies: Negative.   Psychiatric/Behavioral: Negative.       Physical Exam:  height is 5\' 7"  (1.702 m) and weight is 158 lb (71.7 kg). His oral temperature is 97.6 F (36.4 C). His blood pressure is 152/73 (abnormal) and his pulse is 49 (abnormal). His respiration is 20 and oxygen saturation is 100%.   Wt Readings from Last 3 Encounters:  10/26/23 158 lb (71.7 kg)  09/12/23 163 lb 6.4 oz (74.1 kg)  09/06/23 165 lb 6.4 oz (75 kg)    Physical Exam Vitals reviewed.  HENT:     Head: Normocephalic and atraumatic.  Eyes:     Pupils: Pupils are equal, round, and reactive to light.  Cardiovascular:     Rate and Rhythm: Normal rate and regular rhythm.     Heart sounds: Normal heart sounds.  Pulmonary:     Effort: Pulmonary effort is normal.     Breath sounds: Normal breath sounds.  Abdominal:     General: Bowel sounds are normal.     Palpations: Abdomen is soft.  Musculoskeletal:        General: No tenderness or deformity. Normal range of motion.     Cervical back: Normal range of motion.  Lymphadenopathy:     Cervical: No cervical adenopathy.  Skin:    General: Skin is warm and dry.     Findings: No erythema or rash.  Neurological:     Mental Status: He is alert and oriented to person, place, and  time.  Psychiatric:        Behavior: Behavior normal.        Thought Content: Thought content normal.        Judgment: Judgment normal.      Lab Results  Component Value Date   WBC 7.4 10/26/2023   HGB 15.8 10/26/2023   HCT 46.0  10/26/2023   MCV 86.6 10/26/2023   PLT 193 10/26/2023   Lab Results  Component Value Date   FERRITIN 61 08/26/2023   IRON  69 08/26/2023   TIBC 420 08/26/2023   UIBC 351 08/26/2023   IRONPCTSAT 16 (L) 08/26/2023   Lab Results  Component Value Date   RETICCTPCT 1.3 08/26/2023   RBC 5.31 10/26/2023   Lab Results  Component Value Date   KPAFRELGTCHN 14.5 08/26/2023   LAMBDASER 14.3 08/26/2023   KAPLAMBRATIO 1.01 08/26/2023   Lab Results  Component Value Date   IGGSERUM 314 (L) 08/26/2023   IGA 30 (L) 08/26/2023   IGMSERUM 11 (L) 08/26/2023   Lab Results  Component Value Date   TOTALPROTELP 5.8 (L) 08/26/2023   ALBUMINELP 4.0 07/15/2023   A1GS 0.3 07/15/2023   A2GS 0.8 07/15/2023   BETS 1.0 07/15/2023   GAMS 0.4 07/15/2023   MSPIKE Not Observed 07/15/2023   SPEI Comment 01/14/2023     Chemistry      Component Value Date/Time   NA 143 10/26/2023 0934   NA 140 08/31/2021 1026   K 4.0 10/26/2023 0934   CL 105 10/26/2023 0934   CO2 30 10/26/2023 0934   BUN 23 10/26/2023 0934   BUN 21 08/31/2021 1026   CREATININE 1.62 (H) 10/26/2023 0934      Component Value Date/Time   CALCIUM  9.9 10/26/2023 0934   ALKPHOS 56 10/26/2023 0934   AST 44 (H) 10/26/2023 0934   ALT 57 (H) 10/26/2023 0934   BILITOT 0.7 10/26/2023 0934       Impression and Plan: Mr. Woodke is a pleasant 78 yo gentlemen with history of lambda chain myeloma as well as anemia secondary to chronic renal insufficiency.   He has been off chemotherapy now for 9-10 months.  I am very happy that we are able to keep him off the chemotherapy.  Right now, I cannot imagine that his light chain is going to be a problem.  His hemoglobin is doing so well right now.  I am very  impressed..  We will now plan to get him back in another 2 or 3 months.  Hopefully, the longer we can keep him off treatment, the better he will be.   Ivor Mars, MD 5/7/202511:30 AM

## 2023-10-26 NOTE — Progress Notes (Signed)
 BP remains elevated, 152/73, states that's where his cardiologist want it. Keeps BP diary.

## 2023-10-27 LAB — IGG, IGA, IGM
IgA: 30 mg/dL — ABNORMAL LOW (ref 61–437)
IgG (Immunoglobin G), Serum: 356 mg/dL — ABNORMAL LOW (ref 603–1613)
IgM (Immunoglobulin M), Srm: 11 mg/dL — ABNORMAL LOW (ref 15–143)

## 2023-10-27 LAB — KAPPA/LAMBDA LIGHT CHAINS
Kappa free light chain: 13.2 mg/L (ref 3.3–19.4)
Kappa, lambda light chain ratio: 0.95 (ref 0.26–1.65)
Lambda free light chains: 13.9 mg/L (ref 5.7–26.3)

## 2023-10-31 ENCOUNTER — Ambulatory Visit: Payer: 59 | Admitting: Neurology

## 2023-11-01 LAB — PROTEIN ELECTROPHORESIS, SERUM, WITH REFLEX
A/G Ratio: 1.4 (ref 0.7–1.7)
Albumin ELP: 3.6 g/dL (ref 2.9–4.4)
Alpha-1-Globulin: 0.2 g/dL (ref 0.0–0.4)
Alpha-2-Globulin: 0.9 g/dL (ref 0.4–1.0)
Beta Globulin: 0.9 g/dL (ref 0.7–1.3)
Gamma Globulin: 0.4 g/dL (ref 0.4–1.8)
Globulin, Total: 2.5 g/dL (ref 2.2–3.9)
Total Protein ELP: 6.1 g/dL (ref 6.0–8.5)

## 2023-11-02 ENCOUNTER — Other Ambulatory Visit: Payer: Self-pay

## 2023-11-02 ENCOUNTER — Telehealth: Payer: Self-pay | Admitting: Family Medicine

## 2023-11-02 MED ORDER — BD PEN NEEDLE NANO 2ND GEN 32G X 4 MM MISC: 1.0000 | Freq: Two times a day (BID) | 0 refills | Status: DC

## 2023-11-02 NOTE — Telephone Encounter (Signed)
 Rx sent.

## 2023-11-02 NOTE — Telephone Encounter (Signed)
 Copied from CRM [id number]. Topic: Clinical - Medication Refill >> Nov 02, 2023 10:33 AM Howard Macho wrote: Medication: pen needles  Has the patient contacted their pharmacy? Yes (Agent: If no, request that the patient contact the pharmacy for the refill. If patient does not wish to contact the pharmacy document the reason why and proceed with request.) (Agent: If yes, when and what did the pharmacy advise?)  This is the patient's preferred pharmacy:  [facility] [phone] - [place], [place] - [address] [place] [address] Phone: [phone] Fax: [phone]  Is this the correct pharmacy for this prescription? Yes If no, delete pharmacy and type the correct one.   Has the prescription been filled recently? No  Is the patient out of the medication? No  Has the patient been seen for an appointment in the last year OR does the patient have an upcoming appointment? Yes  Can we respond through MyChart? Yes  Agent: Please be advised that Rx refills may take up to 3 business days. We ask that you follow-up with your pharmacy.

## 2023-11-08 DIAGNOSIS — Z7984 Long term (current) use of oral hypoglycemic drugs: Secondary | ICD-10-CM | POA: Diagnosis not present

## 2023-11-08 DIAGNOSIS — E113293 Type 2 diabetes mellitus with mild nonproliferative diabetic retinopathy without macular edema, bilateral: Secondary | ICD-10-CM | POA: Diagnosis not present

## 2023-11-08 DIAGNOSIS — H2513 Age-related nuclear cataract, bilateral: Secondary | ICD-10-CM | POA: Diagnosis not present

## 2023-11-16 ENCOUNTER — Other Ambulatory Visit: Payer: Self-pay | Admitting: Cardiovascular Disease

## 2023-11-24 ENCOUNTER — Other Ambulatory Visit: Payer: Self-pay | Admitting: Hematology & Oncology

## 2023-12-06 ENCOUNTER — Other Ambulatory Visit: Payer: Self-pay | Admitting: Cardiovascular Disease

## 2023-12-08 ENCOUNTER — Other Ambulatory Visit: Payer: Self-pay | Admitting: Family Medicine

## 2023-12-08 ENCOUNTER — Other Ambulatory Visit: Payer: Self-pay

## 2023-12-08 MED ORDER — ONETOUCH VERIO VI STRP: ORAL_STRIP | 1 refills | Status: DC

## 2023-12-08 NOTE — Telephone Encounter (Signed)
 Copied from CRM [id number]. Topic: Clinical - Medication Refill >> Dec 08, 2023 10:17 AM [place] J wrote: Medication: glucose blood (ONETOUCH VERIO) test strip  Has the patient contacted their pharmacy? Yes (Agent: If no, request that the patient contact the pharmacy for the refill. If patient does not wish to contact the pharmacy document the reason why and proceed with request.) (Agent: If yes, when and what did the pharmacy advise?) Patient states that the pharmacy has been trying to reach out for refills but we did not receive it. This is the patient's preferred pharmacy:  [facility] [phone] - [place], [place] - [address] [place] [address] Phone: [phone] Fax: [phone]  Is this the correct pharmacy for this prescription? Yes If no, delete pharmacy and type the correct one.   Has the prescription been filled recently? No  Is the patient out of the medication? Yes  Has the patient been seen for an appointment in the last year OR does the patient have an upcoming appointment? Yes  Can we respond through MyChart? Yes  Agent: Please be advised that Rx refills may take up to 3 business days. We ask that you follow-up with your pharmacy.

## 2023-12-19 ENCOUNTER — Ambulatory Visit (INDEPENDENT_AMBULATORY_CARE_PROVIDER_SITE_OTHER): Admitting: Family Medicine

## 2023-12-19 ENCOUNTER — Encounter: Payer: Self-pay | Admitting: Family Medicine

## 2023-12-19 VITALS — BP 136/70 | HR 58 | Temp 98.1°F | Wt 157.0 lb

## 2023-12-19 DIAGNOSIS — Z951 Presence of aortocoronary bypass graft: Secondary | ICD-10-CM

## 2023-12-19 DIAGNOSIS — N183 Chronic kidney disease, stage 3 unspecified: Secondary | ICD-10-CM

## 2023-12-19 DIAGNOSIS — C9000 Multiple myeloma not having achieved remission: Secondary | ICD-10-CM | POA: Diagnosis not present

## 2023-12-19 DIAGNOSIS — D500 Iron deficiency anemia secondary to blood loss (chronic): Secondary | ICD-10-CM | POA: Diagnosis not present

## 2023-12-19 DIAGNOSIS — D631 Anemia in chronic kidney disease: Secondary | ICD-10-CM

## 2023-12-19 DIAGNOSIS — I10 Essential (primary) hypertension: Secondary | ICD-10-CM

## 2023-12-19 DIAGNOSIS — I779 Disorder of arteries and arterioles, unspecified: Secondary | ICD-10-CM

## 2023-12-19 DIAGNOSIS — E118 Type 2 diabetes mellitus with unspecified complications: Secondary | ICD-10-CM

## 2023-12-19 DIAGNOSIS — Z794 Long term (current) use of insulin: Secondary | ICD-10-CM | POA: Diagnosis not present

## 2023-12-19 DIAGNOSIS — E1149 Type 2 diabetes mellitus with other diabetic neurological complication: Secondary | ICD-10-CM | POA: Diagnosis not present

## 2023-12-19 DIAGNOSIS — I739 Peripheral vascular disease, unspecified: Secondary | ICD-10-CM | POA: Diagnosis not present

## 2023-12-19 DIAGNOSIS — I4892 Unspecified atrial flutter: Secondary | ICD-10-CM | POA: Diagnosis not present

## 2023-12-19 DIAGNOSIS — N1831 Chronic kidney disease, stage 3a: Secondary | ICD-10-CM

## 2023-12-19 DIAGNOSIS — E782 Mixed hyperlipidemia: Secondary | ICD-10-CM | POA: Diagnosis not present

## 2023-12-19 LAB — POCT GLYCOSYLATED HEMOGLOBIN (HGB A1C)
HbA1c POC (<> result, manual entry): 6.8 % (ref 4.0–5.6)
HbA1c, POC (controlled diabetic range): 6.8 % (ref 0.0–7.0)
HbA1c, POC (prediabetic range): 6.8 % — AB (ref 5.7–6.4)
Hemoglobin A1C: 6.8 % — AB (ref 4.0–5.6)

## 2023-12-19 MED ORDER — ONETOUCH VERIO VI STRP: ORAL_STRIP | 4 refills | Status: DC

## 2023-12-19 MED ORDER — INSULIN GLARGINE 100 UNIT/ML SOLOSTAR PEN: PEN_INJECTOR | SUBCUTANEOUS | 1 refills | Status: DC

## 2023-12-19 MED ORDER — EMPAGLIFLOZIN 10 MG PO TABS: 10.0000 mg | ORAL_TABLET | Freq: Every day | ORAL | 5 refills | Status: DC

## 2023-12-19 MED ORDER — TRAZODONE HCL 50 MG PO TABS: 50.0000 mg | ORAL_TABLET | Freq: Every evening | ORAL | 1 refills | Status: DC | PRN

## 2023-12-19 NOTE — Patient Instructions (Addendum)
 Return in about 4 months (around 04/04/2024) for Routine chronic condition follow-up.        Great to see you today.  I have refilled the medication(s) we provide.   If labs were collected or images ordered, we will inform you of  results once we have received them and reviewed. We will contact you either by echart message, or telephone call.  Please give ample time to the testing facility, and our office to run,  receive and review results. Please do not call inquiring of results, even if you can see them in your chart. We will contact you as soon as we are able. If it has been over 1 week since the test was completed, and you have not yet heard from us , then please call us .    - echart message- for normal results that have been seen by the patient already.   - telephone call: abnormal results or if patient has not viewed results in their echart.  If a referral to a specialist was entered for you, please call us  in 2 weeks if you have not heard from the specialist office to schedule.

## 2023-12-19 NOTE — Progress Notes (Signed)
 Patient ID: Marvin Salinas, male  DOB: [birth year], 78 y.o.   MRN: [id number] Patient Care Team    Relationship Specialty Notifications Start End  Catherine Charlies LABOR, DO PCP - General Family Medicine  01/24/19   Shellia Oh, MD (Inactive) Consulting Physician Pulmonary Disease  01/26/19   [name], Aleene PARAS, MD Consulting Physician Cardiology  04/30/20   Teressa Toribio SQUIBB, MD (Inactive) Attending Physician Gastroenterology  10/23/20   Franchot Lauraine HERO, NP Nurse Practitioner Nurse Practitioner  10/23/20    Comment: Hematology  Georjean Darice HERO, MD Consulting Physician Neurology  09/01/21   Rachele Gaynell RAMAN, MD Consulting Physician Nephrology  10/28/21    Comment: [facility]    03/28/23   Darron Deatrice LABOR, MD Consulting Physician Cardiology  03/28/23     Chief Complaint  Patient presents with   Diabetes    Chronic Conditions/illness Management. Pt is fasting.     Subjective: Marvin Salinas is a 78 y.o.  male present for Chronic Conditions/illness Management Type 2 diabetes mellitus with Salinas 3 chronic kidney disease, with long-term current use of insulin  (HCC) Pt reports compliance with Lantus  20/25 twice daily(usually) units . Occasional night time low to 50s reported Patient denies dizziness, hyperglycemic or hypoglycemic events. Patient denies numbness, tingling in the extremities or nonhealing wounds of feet.   Atrial fibrillation with RVR (HCC)/Essential hypertension/Ischemic heart disease/ S/P CABG x 3/Paroxysmal atrial flutter (HCC)/PAD (peripheral artery disease) (HCC)/Nonsustained ventricular tachycardia (HCC)/HLD/NSTEMI/h/o stroke/CKD 3/chronic anticoagulation Patient has a history of severe 3-vessel coronary artery disease with chronic total occlusions of the proximal/mid LAD, mid/distal left circumflex/and proximal RCA.  Moderate caliber D1 and distal RPDA demonstrate 70-80% stenosis.  Moderately elevated left ventricular filling pressure.  Coronary  artery stents and CABG have been completed in the past.  He also has a history of cerebral ischemia and TIAs.  Pt reports compliance with  Crestor , Eliquis ,  coreg , Zetia , tikosyn .  Patient denies chest pain, shortness of breath, dizziness or lower extremity edema.  Pt is prescribed statin. Diet: Heart healthy-low-sodium diet encouraged RF: Hypertension, hyperlipidemia, former smoker, STEMI, stroke, diabetes, family history, CAD, CVD, PAD, bilateral carotid artery stenosis, TIA   Lambda light chain myeloma: Established with oncology, Dr. Timmy.  Prescribed Zofran , gabapentin  and famciclovir      03/28/2023    8:36 AM 03/21/2023    1:07 PM 10/11/2022    8:20 AM 03/31/2022   11:12 AM 03/19/2022    2:00 PM  Depression screen PHQ 2/9  Decreased Interest 1 0 1 0 0  Down, Depressed, Hopeless 1 0 1 0 0  PHQ - 2 Score 2 0 2 0 0  Altered sleeping 1  1    Tired, decreased energy 1  1    Change in appetite 0  0    Feeling bad or failure about yourself  0  0    Trouble concentrating 0  0    Moving slowly or fidgety/restless 0  0    Suicidal thoughts 0  0    PHQ-9 Score 4  4    Difficult doing work/chores Not difficult at all  Somewhat difficult        03/28/2023    8:36 AM 10/28/2021    9:13 AM  GAD 7 : Generalized Anxiety Score  Nervous, Anxious, on Edge 0 0  Control/stop worrying 1 1  Worry too much - different things 1 1  Trouble relaxing 1 1  Restless 1  1  Easily annoyed or irritable 0 0  Afraid - awful might happen 0 0  Total GAD 7 Score 4 4  Anxiety Difficulty Not difficult at all           09/08/2023    9:04 AM 03/28/2023    8:35 AM 03/21/2023    9:22 AM 10/11/2022    8:20 AM 06/26/2022    9:16 AM  Fall Risk   Falls in the past year? 0 0 0 1 1  Number falls in past yr:  0 0 1 0  Injury with Fall?  0 0 0 0  Risk for fall due to :   No Fall Risks    Follow up  Falls evaluation completed Falls evaluation completed Falls evaluation completed     Immunization History   Administered Date(s) Administered   Fluad Quad(high Dose 65+) 02/12/2019, 04/29/2020, 04/22/2021, 06/09/2022   Fluzone Influenza virus vaccine,trivalent (IIV3), split virus 04/21/2016   Influenza Split 05/08/2013, 03/28/2017   Influenza, High Dose Seasonal PF 05/08/2013, 03/28/2017, 05/02/2018, 05/12/2023   Influenza,inj,Quad PF,6+ Mos 04/21/2016   MODERNA COVID-19 SARS-COV-2 PEDS BIVALENT BOOSTER 24yr-36yr 06/18/2020, 12/10/2020   Moderna SARS-COV2 Booster Vaccination 12/10/2020   Moderna Sars-Covid-2 Vaccination 08/27/2019, 10/03/2019   Pneumococcal Conjugate-13 07/13/2016   Pneumococcal Polysaccharide-23 02/21/2018   Tdap 02/15/2013   Zoster, Live 06/22/2015    No results found.  Past Medical History:  Diagnosis Date   Acute kidney injury superimposed on chronic kidney disease (HCC) 10/12/2017   Atrial fibrillation with RVR (HCC) 10/27/2017   Back pain    Basilar artery stenosis    on chronic Plavix    Benign prostatic hypertrophy without urinary obstruction 04/17/2014   Carotid artery disease (HCC) 10/06/2010   Carotid US  04/2019: Bilat ICA 40-59; L subclavian stenosis  // Carotid US  11/21: Bilat ICA 40-59; bilateral subclavian stenosis // Carotid US  11/22: Bilateral ICA 40-59; left subclavian stenosis // Carotid US  04/28/2022: Bilateral ICA 40-59; left subclavian stenosis   Cerebral vascular disease    with prior TIA's; followed by Dr. Maurice   Depression, neurotic 11/21/2013   Diabetes mellitus    on insulin    Enthesopathy of ankle and tarsus 09/04/2007   Overview:  Metatarsalgia    Enthesopathy of ankle and tarsus 09/04/2007   Overview:  Metatarsalgia  Formatting of this note might be different from the original. Metatarsalgia  10/1 IMO update   History of renal calculi    Hyperlipidemia    Hypertension    Ischemic heart disease    prior PCI to RCA in 1989. S/P PCI to LAD and OM in 1992. S/P PCI to first DX in 2000. S/P CABG x 3 in May 2011   Lambda light chain myeloma  (HCC) 10/28/2021   Left hip pain 01/03/2020   OSA (obstructive sleep apnea) 01/11/2018   AHI 18.1 and SaO2 low 73%   PAD (peripheral artery disease) (HCC) 11/18/2017   ABIs/Arterial US  04/2019: R 1.21; L 0.66 // R SFA 30-49, stable > 50 CIA and EIA stenosis; L > 50 CIA stenosis (likely represents severe stenosis or short segment occlusion)   Peripheral neuropathy    Pneumonia 02/2011   Sacroiliac joint dysfunction of left side 01/03/2020   Stroke (HCC) 04/16/2001   small right cerebellar infarct on 04/16/2001 at that time he was found to have proximal left vertebral artery, proximal left common carotid artery and both external carotid artery stenosis as well as intracranial stenosis involving mid basilar artery- 07/2011 add questionable TIA  Allergies  Allergen Reactions   Cymbalta  [Duloxetine  Hcl] Other (See Comments)    Halucinations   Codeine Nausea And Vomiting   Lisinopril Cough   Nsaids Other (See Comments)    CKD   Dapagliflozin Rash   Latex Hives   Past Surgical History:  Procedure Laterality Date    NASAL ENDOSCOPY  01/11/2020   chronic rhinitis w/o evidence of acute sinusitis, bilateral inferior turbinate hypertrophy   ABDOMINAL AORTOGRAM W/LOWER EXTREMITY Bilateral 05/30/2019   Procedure: ABDOMINAL AORTOGRAM W/LOWER EXTREMITY;  Surgeon: Darron Deatrice LABOR, MD;  Location: [facility];  Service: Cardiovascular;  Laterality: Bilateral;   ANGIOPLASTY  1989   right coronary artery   ANGIOPLASTY  1992   LAD and OM   ANGIOPLASTY  1998   First DX   BIOPSY  05/06/2023   Procedure: BIOPSY;  Surgeon: Stacia Glendia BRAVO, MD;  Location: [facility];  Service: Gastroenterology;;   BRAIN SURGERY     on prior records   CORONARY ARTERY BYPASS GRAFT  11/12/2009   LIMA to LAD, SVG to OM and SVG to RCA   CORONARY STENT PLACEMENT  2000   Stent to LAD/Circumflex with angioplasty to first diagonal    ENTEROSCOPY N/A 05/06/2023   Procedure: ENTEROSCOPY;  Surgeon: Stacia Glendia BRAVO, MD;  Location: [facility];  Service: Gastroenterology;  Laterality: N/A;   HOT HEMOSTASIS N/A 05/06/2023   Procedure: HOT HEMOSTASIS (ARGON PLASMA COAGULATION/BICAP);  Surgeon: Stacia Glendia BRAVO, MD;  Location: [facility];  Service: Gastroenterology;  Laterality: N/A;   LEFT HEART CATH AND CORS/GRAFTS ANGIOGRAPHY N/A 10/13/2017   Procedure: LEFT HEART CATH AND CORS/GRAFTS ANGIOGRAPHY;  Surgeon: Mady Bruckner, MD;  Location: [facility];  Service: Cardiovascular;  Laterality: N/A;   SUBMUCOSAL TATTOO INJECTION  05/06/2023   Procedure: SUBMUCOSAL TATTOO INJECTION;  Surgeon: Stacia Glendia BRAVO, MD;  Location: [facility];  Service: Gastroenterology;;   Family History  Problem Relation Age of Onset   Depression Mother    Early death Mother    Kidney disease Mother    Ovarian cancer Mother    Hypertension Father    Heart disease Father    Heart attack Father    Asthma Brother    Diabetes Brother    Stroke Other        Uncle   Diabetes Sister    Colon cancer Neg Hx    Rectal cancer Neg Hx    Stomach cancer Neg Hx    Esophageal cancer Neg Hx    Social History   Social History Narrative   Marital status/children/pets: divorced.   Education/employment: retired, Patient has his GED. 9th grade education.    Safety:      -smoke alarm in the home:Yes     - wears seatbelt: Yes   Patient is right handed.   Patient does not drink any caffeine.   Lives in a one story home     Allergies as of 12/19/2023       Reactions   Cymbalta  [duloxetine  Hcl] Other (See Comments)   Halucinations   Codeine Nausea And Vomiting   Lisinopril Cough   Nsaids Other (See Comments)   CKD   Dapagliflozin Rash   Latex Hives        Medication List        Accurate as of December 19, 2023  9:56 AM. If you have any questions, ask your nurse or doctor.          STOP taking these medications  diphenhydrAMINE  25 mg capsule Commonly known as: BENADRYL  Stopped by: Charlies Bellini       TAKE these medications    acetaminophen  500 MG tablet Commonly known as: TYLENOL  Take 1,000 mg by mouth every 6 (six) hours as needed for mild pain (pain score 1-3) or moderate pain (pain score 4-6).   apixaban  5 MG Tabs tablet Commonly known as: Eliquis  Take 1 tablet (5 mg total) by mouth 2 (two) times daily. Further refills from cardio   BD Pen Needle Nano 2nd Gen 32G X 4 MM Misc Generic drug: Insulin  Pen Needle Place 1 Needle onto the skin in the morning and at bedtime.   carvedilol  3.125 MG tablet Commonly known as: COREG  Take 1 tablet (3.125 mg total) by mouth 2 (two) times daily.   dofetilide  125 MCG capsule Commonly known as: TIKOSYN  TAKE 1 CAPSULE BY MOUTH TWICE A DAY   empagliflozin  10 MG Tabs tablet Commonly known as: Jardiance  Take 1 tablet (10 mg total) by mouth daily before breakfast.   ezetimibe  10 MG tablet Commonly known as: ZETIA  Take 1 tablet (10 mg total) by mouth daily.   famciclovir  250 MG tablet Commonly known as: FAMVIR  TAKE 1 TABLET BY MOUTH EVERY DAY   gabapentin  300 MG capsule Commonly known as: NEURONTIN  Take 1 capsule (300 mg total) by mouth 3 (three) times daily.   GENTEAL OP Place 1 drop into both eyes daily as needed (dry eyes).   insulin  glargine 100 UNIT/ML Solostar Pen Commonly known as: LANTUS  20 units BID What changed: additional instructions Changed by: Annise Boran   nitroGLYCERIN  0.4 MG SL tablet Commonly known as: NITROSTAT  Place 0.4 mg under the tongue every 5 (five) minutes as needed for chest pain.   ondansetron  8 MG tablet Commonly known as: ZOFRAN  TAKE 1 TABLET BY MOUTH EVERY 8 HOURS AS NEEDED FOR NAUSEA OR VOMITING   OneTouch Delica Plus Lancet33G Misc USE UP TO FOUR TIMES DAILY AS DIRECTED   OneTouch Verio test strip Generic drug: glucose blood USE UP TO FOUR TIMES DAILY AS DIRECTED.   rosuvastatin  20 MG tablet Commonly known as: CRESTOR  Take 1 tablet (20 mg total) by mouth at bedtime.    traZODone  50 MG tablet Commonly known as: DESYREL  Take 1 tablet (50 mg total) by mouth at bedtime as needed for sleep.        All past medical history, surgical history, allergies, family history, immunizations andmedications were updated in the EMR today and reviewed under the history and medication portions of their EMR.      ROS: 14 pt review of systems performed and negative (unless mentioned in an HPI)  Objective: BP 136/70   Pulse (!) 58   Temp 98.1 F (36.7 C)   Wt 157 lb (71.2 kg)   SpO2 97%   BMI 24.59 kg/m  Physical Exam Vitals and nursing note reviewed.  Constitutional:      General: He is not in acute distress.    Appearance: Normal appearance. He is not ill-appearing, toxic-appearing or diaphoretic.  HENT:     Head: Normocephalic and atraumatic.   Eyes:     General: No scleral icterus.       Right eye: No discharge.        Left eye: No discharge.     Extraocular Movements: Extraocular movements intact.     Pupils: Pupils are equal, round, and reactive to light.    Cardiovascular:     Rate and Rhythm: Normal rate and regular rhythm.  Heart sounds: No murmur heard. Pulmonary:     Effort: Pulmonary effort is normal. No respiratory distress.     Breath sounds: Normal breath sounds. No wheezing, rhonchi or rales.   Musculoskeletal:     Cervical back: Neck supple.     Right lower leg: No edema.     Left lower leg: No edema.   Skin:    General: Skin is warm and dry.     Coloration: Skin is not jaundiced or pale.     Findings: No rash.   Neurological:     Mental Status: He is alert and oriented to person, place, and time. Mental status is at baseline.   Psychiatric:        Mood and Affect: Mood normal.        Behavior: Behavior normal.        Thought Content: Thought content normal.        Judgment: Judgment normal.      Results for orders placed or performed in visit on 12/19/23 (from the past 24 hours)  POCT HgB A1C     Status: Abnormal    Collection Time: 12/19/23  9:41 AM  Result Value Ref Range   Hemoglobin A1C 6.8 (A) 4.0 - 5.6 %   HbA1c POC (<> result, manual entry) 6.8 4.0 - 5.6 %   HbA1c, POC (prediabetic range) 6.8 (A) 5.7 - 6.4 %   HbA1c, POC (controlled diabetic range) 6.8 0.0 - 7.0 %       Assessment/plan: Marvin Salinas is a 78 y.o. male present for chronic condition management Type 2 diabetes mellitus with Salinas 3 chronic kidney disease, with long-term current use of insulin  (HCC) -Patient was encouraged to follow a diabetic diet and make an effort to get routine exercise.  -decrease Lantus  to 20 units in the a.m and 20 at night -consider wegovy start next visit if needed for cardiac/stroke and DM - Given his cardiac history and his CKD oral options are limited-insurance does not cover Jardiance .  Developed a rash with Farxiga. - Medicines tried: Ozempic (side effects), Farxiga (rash) - He declined nutrition referral - microalb UTD/08/2023 PNA series: Pneumonia series completed Flu shot:declined(recommneded yearly).   Foot exam: Referred to podiatry 12/2018-completed 09/12/2023 Eye exam: [facility] 05/2023 UTD A1c: 8.9>>> 13.7 (08/22/2018)>>11 (01/24/2019)>>10.8 08/20/2019> 10.5>10.3> 6.4 > 6.2 > 8.4 >7.6 today> 8.1(requiring chronic steroids for myeloma) > 8.8>7.0: (03/17/2023)>  > 8.0> 6.8 today!  Atrial fibrillation with RVR (HCC)/Essential hypertension/Ischemic heart disease/ S/P CABG x 3/Paroxysmal atrial flutter (HCC)/PAD (peripheral artery disease) (HCC)/Nonsustained ventricular tachycardia (HCC)/HLD/NSTEMI/h/o stroke/PAD Stable - Patient aware goal is less than 130/80.   - Prescribed by cardio:coreg , tikosyn , Eliquis  (recently changed from xarelto ) - Continue Crestor  20 mg daily  - Continue zeita.    Chronic kidney disease (CKD), Salinas III (moderate) (HCC)/lambda light chain myeloma Renally dose meds. Avoid NSAIDs. PTH/calcium /vitamin D  > by nephro  Established with heme-onc for  myeloma  Sleep disturbance: Uncomfortable from chemo SE and can not shut his mind down to relax.  Continue  trazodone  50 mg at bedtime to help sleep PRN.    Return in about 4 months (around 04/04/2024) for Routine chronic condition follow-up.  Orders Placed This Encounter  Procedures   POCT HgB A1C    Meds ordered this encounter  Medications   traZODone  (DESYREL ) 50 MG tablet    Sig: Take 1 tablet (50 mg total) by mouth at bedtime as needed for sleep.    Dispense:  90 tablet    Refill:  1   insulin  glargine (LANTUS ) 100 UNIT/ML Solostar Pen    Sig: 20 units BID    Dispense:  90 mL    Refill:  1   empagliflozin  (JARDIANCE ) 10 MG TABS tablet    Sig: Take 1 tablet (10 mg total) by mouth daily before breakfast.    Dispense:  30 tablet    Refill:  5   glucose blood (ONETOUCH VERIO) test strip    Sig: USE UP TO FOUR TIMES DAILY AS DIRECTED.    Dispense:  300 strip    Refill:  4    Referral Orders  No referral(s) requested today     Note is dictated utilizing voice recognition software. Although note has been proof read prior to signing, occasional typographical errors still can be missed. If any questions arise, please do not hesitate to call for verification.  Electronically signed by: Charlies Bellini, DO [facility] [place]

## 2024-01-02 ENCOUNTER — Inpatient Hospital Stay

## 2024-01-02 ENCOUNTER — Inpatient Hospital Stay: Attending: Family

## 2024-01-02 ENCOUNTER — Ambulatory Visit: Admitting: Hematology & Oncology

## 2024-01-02 ENCOUNTER — Encounter: Payer: Self-pay | Admitting: Medical Oncology

## 2024-01-02 ENCOUNTER — Inpatient Hospital Stay (HOSPITAL_BASED_OUTPATIENT_CLINIC_OR_DEPARTMENT_OTHER): Admitting: Medical Oncology

## 2024-01-02 VITALS — BP 166/61 | HR 50 | Temp 97.5°F | Resp 18 | Ht 67.0 in | Wt 155.3 lb

## 2024-01-02 DIAGNOSIS — D500 Iron deficiency anemia secondary to blood loss (chronic): Secondary | ICD-10-CM | POA: Diagnosis not present

## 2024-01-02 DIAGNOSIS — Z79899 Other long term (current) drug therapy: Secondary | ICD-10-CM | POA: Insufficient documentation

## 2024-01-02 DIAGNOSIS — C9000 Multiple myeloma not having achieved remission: Secondary | ICD-10-CM

## 2024-01-02 DIAGNOSIS — N189 Chronic kidney disease, unspecified: Secondary | ICD-10-CM | POA: Insufficient documentation

## 2024-01-02 DIAGNOSIS — D631 Anemia in chronic kidney disease: Secondary | ICD-10-CM | POA: Diagnosis not present

## 2024-01-02 DIAGNOSIS — N183 Chronic kidney disease, stage 3 unspecified: Secondary | ICD-10-CM | POA: Diagnosis not present

## 2024-01-02 DIAGNOSIS — K922 Gastrointestinal hemorrhage, unspecified: Secondary | ICD-10-CM | POA: Insufficient documentation

## 2024-01-02 LAB — CBC WITH DIFFERENTIAL (CANCER CENTER ONLY)
Abs Immature Granulocytes: 0.01 K/uL (ref 0.00–0.07)
Basophils Absolute: 0 K/uL (ref 0.0–0.1)
Basophils Relative: 1 %
Eosinophils Absolute: 0.1 K/uL (ref 0.0–0.5)
Eosinophils Relative: 2 %
HCT: 50 % (ref 39.0–52.0)
Hemoglobin: 16.8 g/dL (ref 13.0–17.0)
Immature Granulocytes: 0 %
Lymphocytes Relative: 23 %
Lymphs Abs: 1.6 K/uL (ref 0.7–4.0)
MCH: 30.2 pg (ref 26.0–34.0)
MCHC: 33.6 g/dL (ref 30.0–36.0)
MCV: 89.9 fL (ref 80.0–100.0)
Monocytes Absolute: 0.5 K/uL (ref 0.1–1.0)
Monocytes Relative: 7 %
Neutro Abs: 4.7 K/uL (ref 1.7–7.7)
Neutrophils Relative %: 67 %
Platelet Count: 171 K/uL (ref 150–400)
RBC: 5.56 MIL/uL (ref 4.22–5.81)
RDW: 13.2 % (ref 11.5–15.5)
WBC Count: 7 K/uL (ref 4.0–10.5)
nRBC: 0 % (ref 0.0–0.2)

## 2024-01-02 LAB — CMP (CANCER CENTER ONLY)
ALT: 47 U/L — ABNORMAL HIGH (ref 0–44)
AST: 43 U/L — ABNORMAL HIGH (ref 15–41)
Albumin: 4.5 g/dL (ref 3.5–5.0)
Alkaline Phosphatase: 59 U/L (ref 38–126)
Anion gap: 6 (ref 5–15)
BUN: 18 mg/dL (ref 8–23)
CO2: 30 mmol/L (ref 22–32)
Calcium: 9.5 mg/dL (ref 8.9–10.3)
Chloride: 107 mmol/L (ref 98–111)
Creatinine: 1.53 mg/dL — ABNORMAL HIGH (ref 0.61–1.24)
GFR, Estimated: 46 mL/min — ABNORMAL LOW (ref >60)
Glucose, Bld: 96 mg/dL (ref 70–99)
Potassium: 4.5 mmol/L (ref 3.5–5.1)
Sodium: 143 mmol/L (ref 135–145)
Total Bilirubin: 0.6 mg/dL (ref 0.0–1.2)
Total Protein: 6.3 g/dL — ABNORMAL LOW (ref 6.5–8.1)

## 2024-01-02 NOTE — Progress Notes (Signed)
 Hematology and Oncology Follow Up Visit  Marvin Salinas [id number] 1946-04-29 78 y.o. 01/02/2024   Principle Diagnosis:  Lambda light chain myeloma --normal cytogenetics Anemia of renal sufficiency Iron  deficiency anemia secondary to GI bleed   Current Therapy:        Faspro/Velcade /Revlimid  -- started on 11/05/2021 - monthly -- revlimid  stopped in 09/2022.  All therapy stopped on 02/11/2023 Aranesp  300 mcg subcu for hemoglobin less than 11 Venofer  200 mg IV given on 09/22/2023    Interim History:  Marvin Salinas is here today for follow-up.  He has been off treatment now for about 11 months.   He reports that he continues to feel well. Since his last iron  infusion about 4 months ago his fatigue has improved.   So far, the light chain myeloma really has not been a problem. His last light chain measurement was 1.3 mg/dL in May. He is excited that he has been off of treatment for so long.   He continues to see cardiology and has an appointment within the next month.   He has had no issues with nausea or vomiting.  He has had no problems with bowels or bladder.  There is been no melena or bright red blood per rectum.  No chest pains, no dizziness.   Appetite has been ok  He has had no rashes.  He has had no leg swelling. No night sweats.   Overall, I would say his performance status is probably ECOG 2.  Wt Readings from Last 3 Encounters:  01/02/24 155 lb 4.8 oz (70.4 kg)  12/19/23 157 lb (71.2 kg)  10/26/23 158 lb (71.7 kg)     Medications:  Allergies as of 01/02/2024       Reactions   Cymbalta  [duloxetine  Hcl] Other (See Comments)   Halucinations   Codeine Nausea And Vomiting   Lisinopril Cough   Nsaids Other (See Comments)   CKD   Dapagliflozin Rash   Latex Hives        Medication List        Accurate as of January 02, 2024 10:21 AM. If you have any questions, ask your nurse or doctor.          STOP taking these medications    gabapentin  300 MG  capsule Commonly known as: NEURONTIN  Stopped by: Marvin Salinas       TAKE these medications    acetaminophen  500 MG tablet Commonly known as: TYLENOL  Take 1,000 mg by mouth every 6 (six) hours as needed for mild pain (pain score 1-3) or moderate pain (pain score 4-6).   apixaban  5 MG Tabs tablet Commonly known as: Eliquis  Take 1 tablet (5 mg total) by mouth 2 (two) times daily. Further refills from cardio   BD Pen Needle Nano 2nd Gen 32G X 4 MM Misc Generic drug: Insulin  Pen Needle Place 1 Needle onto the skin in the morning and at bedtime.   carvedilol  3.125 MG tablet Commonly known as: COREG  Take 1 tablet (3.125 mg total) by mouth 2 (two) times daily.   dofetilide  125 MCG capsule Commonly known as: TIKOSYN  TAKE 1 CAPSULE BY MOUTH TWICE A DAY   empagliflozin  10 MG Tabs tablet Commonly known as: Jardiance  Take 1 tablet (10 mg total) by mouth daily before breakfast.   ezetimibe  10 MG tablet Commonly known as: ZETIA  Take 1 tablet (10 mg total) by mouth daily.   famciclovir  250 MG tablet Commonly known as: FAMVIR  TAKE 1 TABLET BY MOUTH EVERY DAY  GENTEAL OP Place 1 drop into both eyes daily as needed (dry eyes).   insulin  glargine 100 UNIT/ML Solostar Pen Commonly known as: LANTUS  20 units BID   nitroGLYCERIN  0.4 MG SL tablet Commonly known as: NITROSTAT  Place 0.4 mg under the tongue every 5 (five) minutes as needed for chest pain.   ondansetron  8 MG tablet Commonly known as: ZOFRAN  TAKE 1 TABLET BY MOUTH EVERY 8 HOURS AS NEEDED FOR NAUSEA OR VOMITING   OneTouch Delica Plus Lancet33G Misc USE UP TO FOUR TIMES DAILY AS DIRECTED   OneTouch Verio test strip Generic drug: glucose blood USE UP TO FOUR TIMES DAILY AS DIRECTED.   rosuvastatin  20 MG tablet Commonly known as: CRESTOR  Take 1 tablet (20 mg total) by mouth at bedtime.   traZODone  50 MG tablet Commonly known as: DESYREL  Take 1 tablet (50 mg total) by mouth at bedtime as needed for sleep.         Allergies:  Allergies  Allergen Reactions   Cymbalta  [Duloxetine  Hcl] Other (See Comments)    Halucinations   Codeine Nausea And Vomiting   Lisinopril Cough   Nsaids Other (See Comments)    CKD   Dapagliflozin Rash   Latex Hives    Past Medical History, Surgical history, Social history, and Family History were reviewed and updated.  Review of Systems: Review of Systems  Constitutional: Negative.   HENT: Negative.    Eyes: Negative.   Respiratory: Negative.    Cardiovascular: Negative.   Gastrointestinal: Negative.   Genitourinary: Negative.   Musculoskeletal: Negative.   Skin: Negative.   Neurological: Negative.   Endo/Heme/Allergies: Negative.   Psychiatric/Behavioral: Negative.       Physical Exam:  height is 5' 7 (1.702 m) and weight is 155 lb 4.8 oz (70.4 kg). His oral temperature is 97.5 F (36.4 C) (abnormal). His blood pressure is 166/61 (abnormal) and his pulse is 50 (abnormal). His respiration is 18 and oxygen saturation is 100%.   Wt Readings from Last 3 Encounters:  01/02/24 155 lb 4.8 oz (70.4 kg)  12/19/23 157 lb (71.2 kg)  10/26/23 158 lb (71.7 kg)    Physical Exam Vitals reviewed.  HENT:     Head: Normocephalic and atraumatic.  Eyes:     Pupils: Pupils are equal, round, and reactive to light.  Cardiovascular:     Rate and Rhythm: Normal rate and regular rhythm.     Heart sounds: Normal heart sounds.  Pulmonary:     Effort: Pulmonary effort is normal.     Breath sounds: Normal breath sounds.  Abdominal:     General: Bowel sounds are normal.     Palpations: Abdomen is soft.  Musculoskeletal:        General: No tenderness or deformity. Normal range of motion.     Cervical back: Normal range of motion.  Lymphadenopathy:     Cervical: No cervical adenopathy.  Skin:    General: Skin is warm and dry.     Findings: No erythema or rash.  Neurological:     Mental Status: He is alert and oriented to person, place, and time.   Psychiatric:        Behavior: Behavior normal.        Thought Content: Thought content normal.        Judgment: Judgment normal.      Lab Results  Component Value Date   WBC 7.0 01/02/2024   HGB 16.8 01/02/2024   HCT 50.0 01/02/2024   MCV 89.9 01/02/2024  PLT 171 01/02/2024   Lab Results  Component Value Date   FERRITIN 61 08/26/2023   IRON  69 08/26/2023   TIBC 420 08/26/2023   UIBC 351 08/26/2023   IRONPCTSAT 16 (L) 08/26/2023   Lab Results  Component Value Date   RETICCTPCT 1.3 08/26/2023   RBC 5.56 01/02/2024   Lab Results  Component Value Date   KPAFRELGTCHN 13.2 10/26/2023   LAMBDASER 13.9 10/26/2023   KAPLAMBRATIO 0.95 10/26/2023   Lab Results  Component Value Date   IGGSERUM 356 (L) 10/26/2023   IGA 30 (L) 10/26/2023   IGMSERUM 11 (L) 10/26/2023   Lab Results  Component Value Date   TOTALPROTELP 6.1 10/26/2023   ALBUMINELP 3.6 10/26/2023   A1GS 0.2 10/26/2023   A2GS 0.9 10/26/2023   BETS 0.9 10/26/2023   GAMS 0.4 10/26/2023   MSPIKE Not Observed 10/26/2023   SPEI Comment 01/14/2023     Chemistry      Component Value Date/Time   NA 143 01/02/2024 0929   NA 140 08/31/2021 1026   K 4.5 01/02/2024 0929   CL 107 01/02/2024 0929   CO2 30 01/02/2024 0929   BUN 18 01/02/2024 0929   BUN 21 08/31/2021 1026   CREATININE 1.53 (H) 01/02/2024 0929      Component Value Date/Time   CALCIUM  9.5 01/02/2024 0929   ALKPHOS 59 01/02/2024 0929   AST 43 (H) 01/02/2024 0929   ALT 47 (H) 01/02/2024 0929   BILITOT 0.6 01/02/2024 0929     Encounter Diagnoses  Name Primary?   Lambda light chain myeloma (HCC) Yes   Iron  deficiency anemia due to chronic blood loss    Anemia associated with stage 3 chronic renal failure (HCC)     Impression and Plan: Mr. Wintle is a pleasant 78 yo gentlemen with history of lambda chain myeloma as well as anemia secondary to chronic renal insufficiency and iron  deficiency. He is on Aranesp  PRN for Hgb <11.   CBC and CMP  stable to slightly improved.  Today Hgb is 16.8- no Aranesp  Myeloma studies pending  RTC 3 months MD, labs(CBC w/, CMP, light chains, MM panel, iron , ferritin), +- Injection    Marvin CHRISTELLA Dais, PA-C 7/14/202510:21 AM

## 2024-01-03 LAB — IGG, IGA, IGM
IgA: 28 mg/dL — ABNORMAL LOW (ref 61–437)
IgG (Immunoglobin G), Serum: 349 mg/dL — ABNORMAL LOW (ref 603–1613)
IgM (Immunoglobulin M), Srm: 10 mg/dL — ABNORMAL LOW (ref 15–143)

## 2024-01-03 LAB — KAPPA/LAMBDA LIGHT CHAINS
Kappa free light chain: 13.9 mg/L (ref 3.3–19.4)
Kappa, lambda light chain ratio: 1.09 (ref 0.26–1.65)
Lambda free light chains: 12.7 mg/L (ref 5.7–26.3)

## 2024-01-06 ENCOUNTER — Ambulatory Visit (HOSPITAL_BASED_OUTPATIENT_CLINIC_OR_DEPARTMENT_OTHER)
Admission: RE | Admit: 2024-01-06 | Discharge: 2024-01-06 | Disposition: A | Discharge status: Home / Self Care | Source: Ambulatory Visit | Attending: Cardiovascular Disease | Admitting: Cardiovascular Disease

## 2024-01-06 ENCOUNTER — Ambulatory Visit (HOSPITAL_COMMUNITY)
Admission: RE | Admit: 2024-01-06 | Discharge: 2024-01-06 | Disposition: A | Discharge status: Home / Self Care | Source: Ambulatory Visit | Attending: Cardiovascular Disease | Admitting: Cardiovascular Disease

## 2024-01-06 DIAGNOSIS — Z95820 Peripheral vascular angioplasty status with implants and grafts: Secondary | ICD-10-CM | POA: Diagnosis not present

## 2024-01-06 DIAGNOSIS — I739 Peripheral vascular disease, unspecified: Secondary | ICD-10-CM | POA: Insufficient documentation

## 2024-01-06 DIAGNOSIS — I6523 Occlusion and stenosis of bilateral carotid arteries: Secondary | ICD-10-CM | POA: Insufficient documentation

## 2024-01-07 ENCOUNTER — Ambulatory Visit: Payer: Self-pay | Admitting: Physician Assistant

## 2024-01-08 LAB — VAS US ABI WITH/WO TBI

## 2024-01-19 ENCOUNTER — Inpatient Hospital Stay

## 2024-01-19 DIAGNOSIS — C9000 Multiple myeloma not having achieved remission: Secondary | ICD-10-CM

## 2024-01-19 DIAGNOSIS — D631 Anemia in chronic kidney disease: Secondary | ICD-10-CM | POA: Diagnosis not present

## 2024-01-19 DIAGNOSIS — D500 Iron deficiency anemia secondary to blood loss (chronic): Secondary | ICD-10-CM | POA: Diagnosis not present

## 2024-01-19 DIAGNOSIS — Z79899 Other long term (current) drug therapy: Secondary | ICD-10-CM | POA: Diagnosis not present

## 2024-01-19 DIAGNOSIS — K922 Gastrointestinal hemorrhage, unspecified: Secondary | ICD-10-CM | POA: Diagnosis not present

## 2024-01-19 DIAGNOSIS — N189 Chronic kidney disease, unspecified: Secondary | ICD-10-CM | POA: Diagnosis not present

## 2024-01-24 ENCOUNTER — Telehealth: Payer: Self-pay | Admitting: Cardiovascular Disease

## 2024-01-24 NOTE — Telephone Encounter (Signed)
 Leah from Dental office is requesting a callback regarding her wanting to know if pt is supposed to take an antibiotic before cleanings being done. She stated pt had an appt today but they had to send him home due to him informing them of him possibly needing an antibiotic. She'd also like to discuss his heart health due to it not being specified on his paper work that was filled out by the pt. Please advise

## 2024-01-24 NOTE — Telephone Encounter (Signed)
Message sent to [place] for advice.

## 2024-01-25 ENCOUNTER — Ambulatory Visit: Payer: Self-pay | Admitting: Hematology & Oncology

## 2024-01-25 LAB — UPEP/UIFE/LIGHT CHAINS/TP, 24-HR UR
% BETA, Urine: 17.1 %
ALPHA 1 URINE: 6.4 %
Albumin, U: 60 %
Alpha 2, Urine: 10.8 %
Free Kappa Lt Chains,Ur: 22.05 mg/L (ref 1.17–86.46)
Free Kappa/Lambda Ratio: 3.14 (ref 1.83–14.26)
Free Lambda Lt Chains,Ur: 7.03 mg/L (ref 0.27–15.21)
GAMMA GLOBULIN URINE: 5.8 %
Total Protein, Urine-Ur/day: 179 mg/(24.h) — ABNORMAL HIGH (ref 30–150)
Total Protein, Urine: 14.3 mg/dL
Total Volume: 1250

## 2024-01-25 NOTE — Telephone Encounter (Signed)
 The patient has been made aware and approved of the phone call.   Marvin Salinas has been made aware that there is no need for antibiotics.

## 2024-01-25 NOTE — Telephone Encounter (Signed)
No need for antibiotics before dental work

## 2024-01-30 ENCOUNTER — Other Ambulatory Visit: Payer: Self-pay | Admitting: Family Medicine

## 2024-02-04 ENCOUNTER — Other Ambulatory Visit: Payer: Self-pay | Admitting: Hematology & Oncology

## 2024-02-04 DIAGNOSIS — C9000 Multiple myeloma not having achieved remission: Secondary | ICD-10-CM

## 2024-02-07 DIAGNOSIS — C44329 Squamous cell carcinoma of skin of other parts of face: Secondary | ICD-10-CM | POA: Diagnosis not present

## 2024-02-07 DIAGNOSIS — L570 Actinic keratosis: Secondary | ICD-10-CM | POA: Diagnosis not present

## 2024-02-07 DIAGNOSIS — D485 Neoplasm of uncertain behavior of skin: Secondary | ICD-10-CM | POA: Diagnosis not present

## 2024-02-19 IMAGING — DX DG BONE SURVEY MET
10 series · 10 of 10 positions shown · non-contrast
Comparison: No prior bone survey available.

CLINICAL DATA: Lambda light chain disease.

EXAM:
METASTATIC BONE SURVEY

[skull lat]
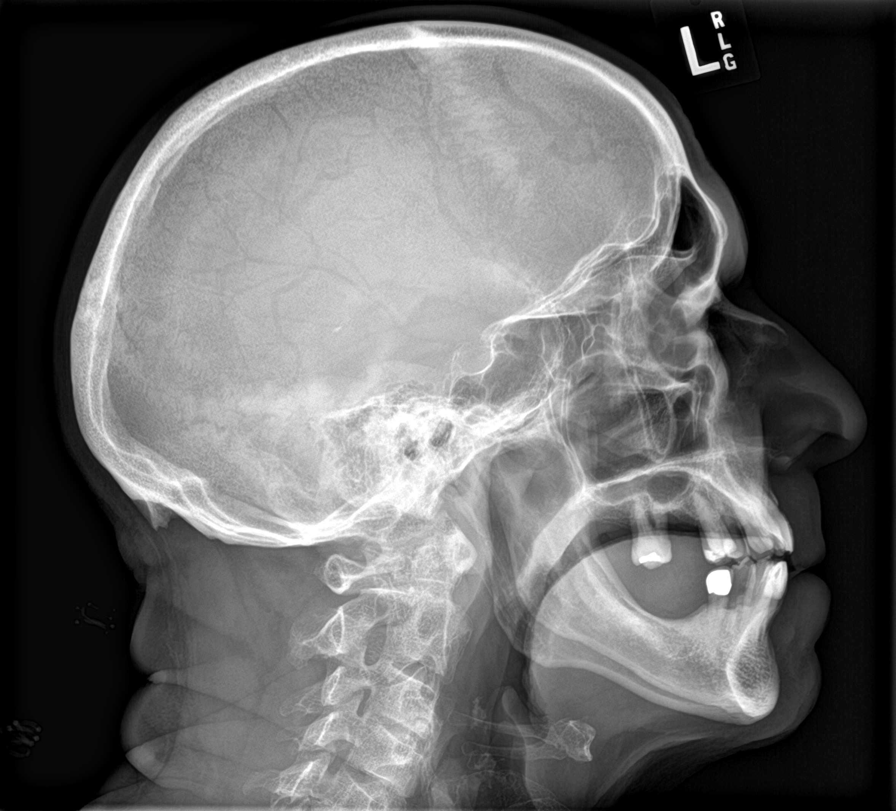

[shoulder ap (1 of 2)]
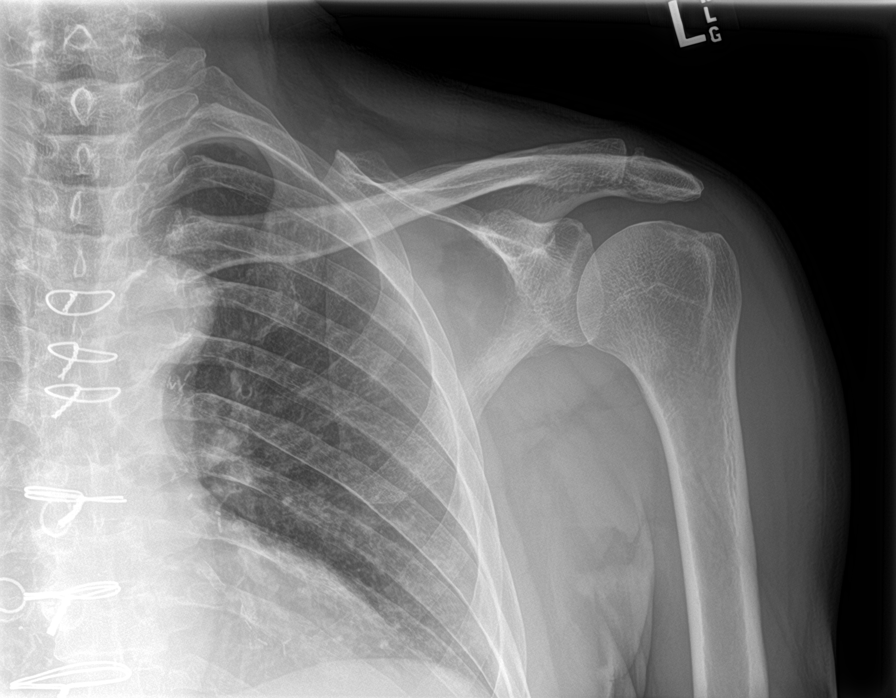

[shoulder ap (2 of 2)]
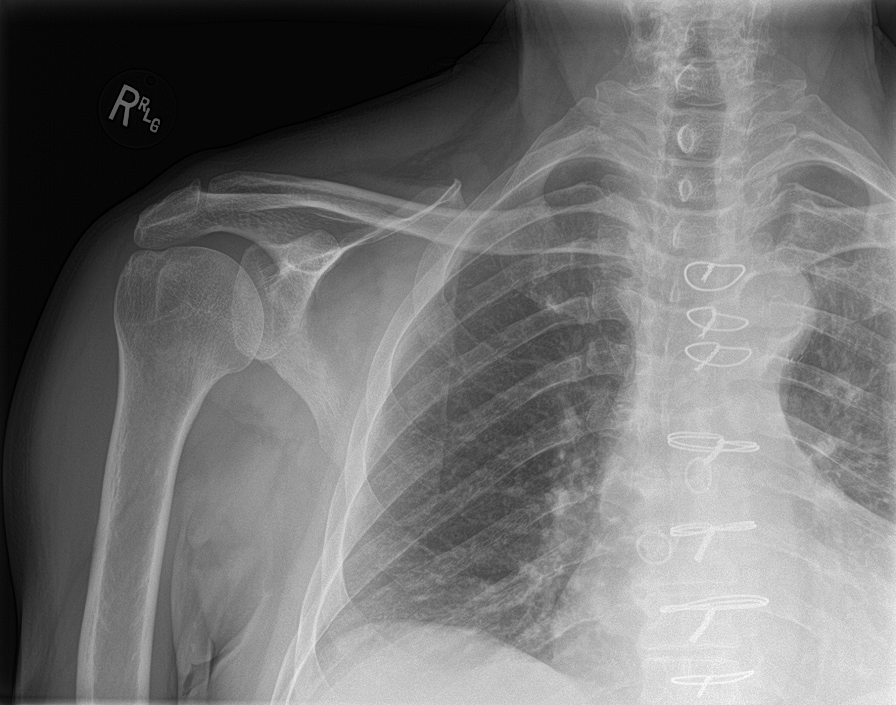

[humerus ap (1 of 2)]
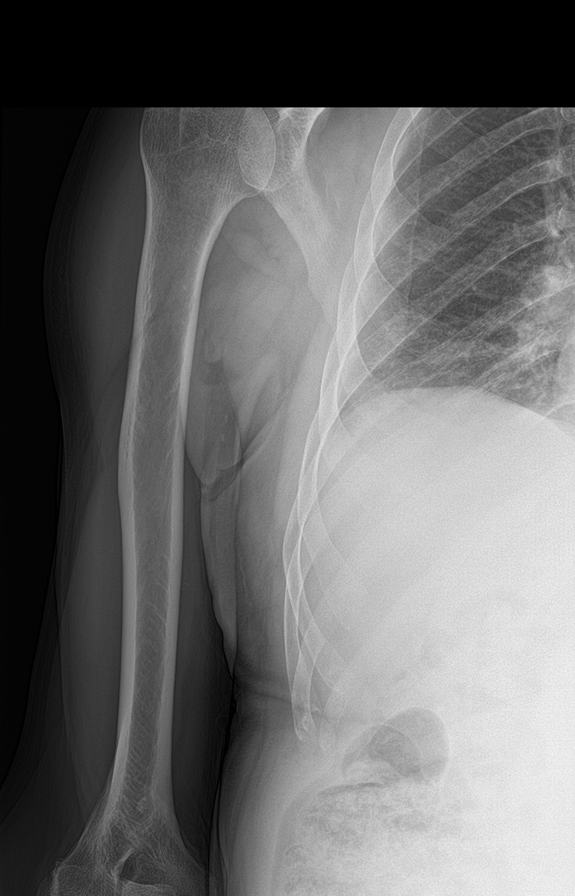

[humerus ap (2 of 2)]
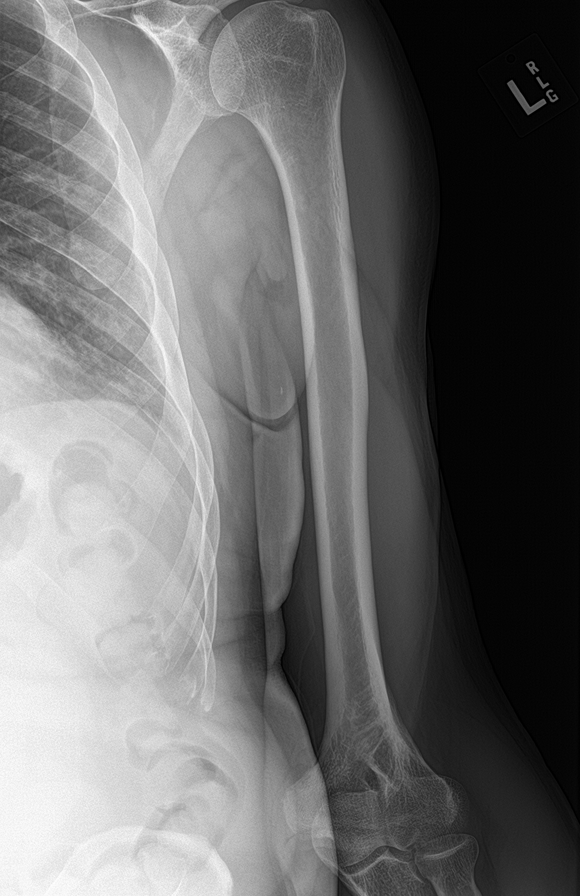

[forearm ap (1 of 2)]
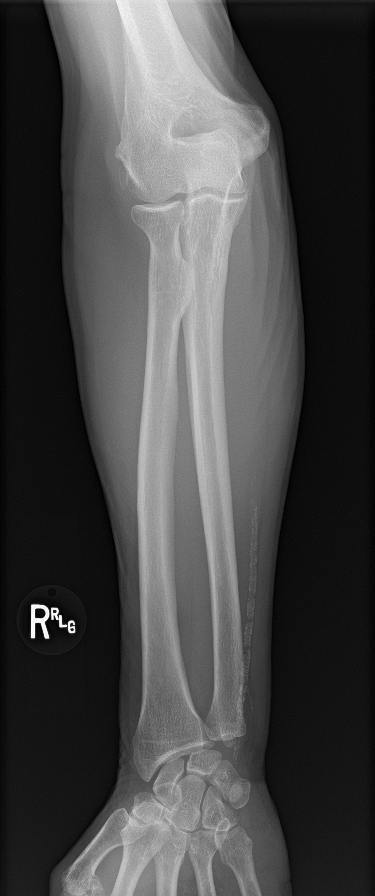

[forearm ap (2 of 2)]
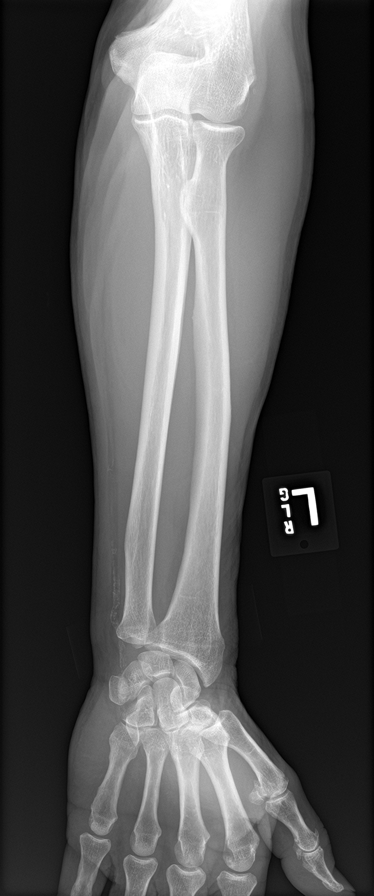

[c-spine ap]
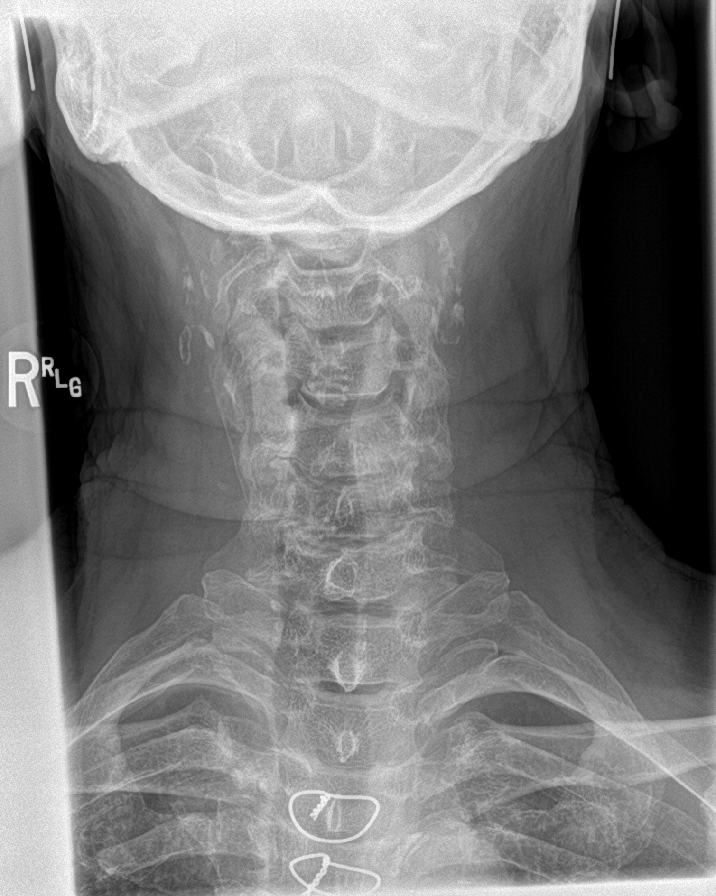

[c-spine lat]
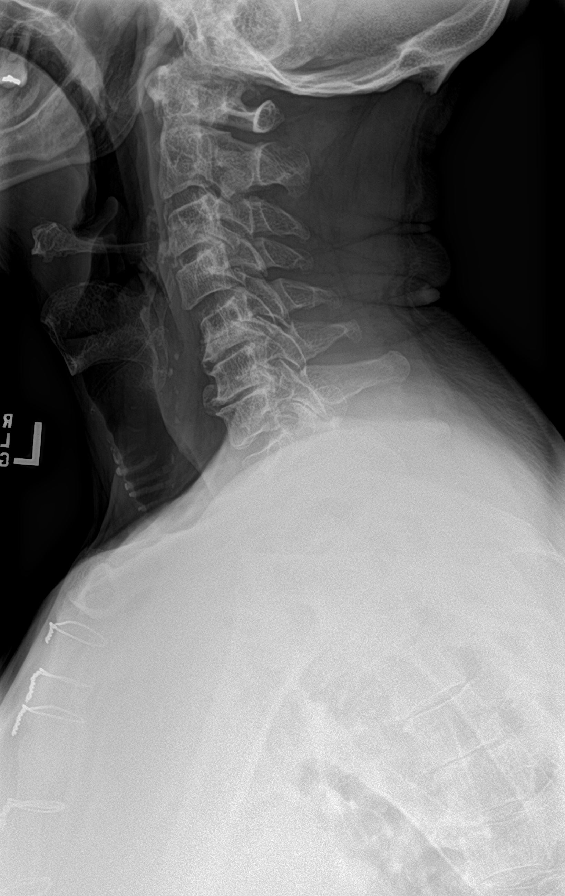

[t-spine ap]
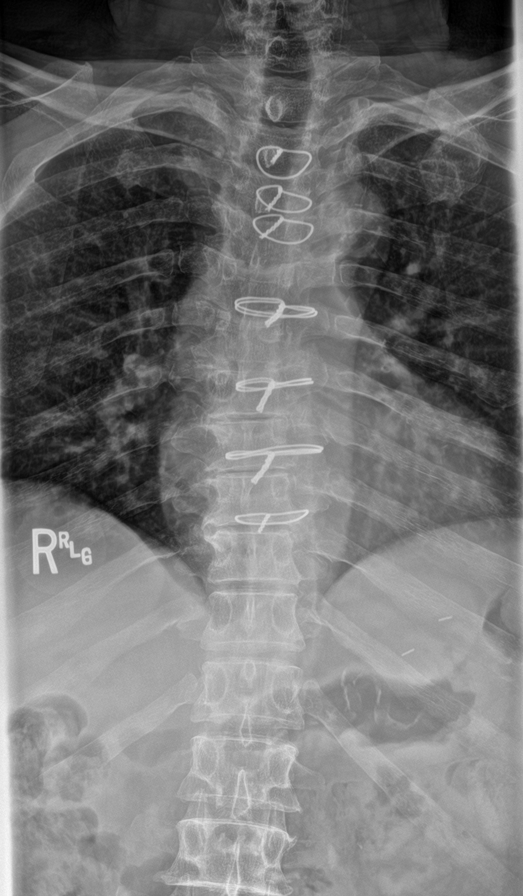

[10 of 10 positions shown; findings below may reference images not displayed]

FINDINGS: No evidence of lytic or lucent lesion. No vertebral compression
deformity. Aortic atherosclerosis is well as advanced peripheral
vascular calcifications. Left common iliac vascular stent. Post
median sternotomy and CABG. There is degenerative change throughout
the cervical, thoracic, and lumbar spine.
IMPRESSION: No evidence of lytic or lucent lesion.

## 2024-02-23 ENCOUNTER — Other Ambulatory Visit: Payer: Self-pay | Admitting: Cardiovascular Disease

## 2024-02-28 DIAGNOSIS — R809 Proteinuria, unspecified: Secondary | ICD-10-CM | POA: Diagnosis not present

## 2024-02-28 DIAGNOSIS — N189 Chronic kidney disease, unspecified: Secondary | ICD-10-CM | POA: Diagnosis not present

## 2024-02-28 DIAGNOSIS — D631 Anemia in chronic kidney disease: Secondary | ICD-10-CM | POA: Diagnosis not present

## 2024-02-28 DIAGNOSIS — E211 Secondary hyperparathyroidism, not elsewhere classified: Secondary | ICD-10-CM | POA: Diagnosis not present

## 2024-03-01 ENCOUNTER — Encounter: Payer: Self-pay | Admitting: Cardiovascular Disease

## 2024-03-05 DIAGNOSIS — N1831 Chronic kidney disease, stage 3a: Secondary | ICD-10-CM | POA: Diagnosis not present

## 2024-03-05 DIAGNOSIS — E1122 Type 2 diabetes mellitus with diabetic chronic kidney disease: Secondary | ICD-10-CM | POA: Diagnosis not present

## 2024-03-05 DIAGNOSIS — E559 Vitamin D deficiency, unspecified: Secondary | ICD-10-CM | POA: Diagnosis not present

## 2024-03-05 DIAGNOSIS — D638 Anemia in other chronic diseases classified elsewhere: Secondary | ICD-10-CM | POA: Diagnosis not present

## 2024-03-15 ENCOUNTER — Ambulatory Visit: Payer: Self-pay

## 2024-03-15 NOTE — Telephone Encounter (Signed)
 No further action needed at this time. Pt was scheduled.

## 2024-03-15 NOTE — Telephone Encounter (Signed)
 Copied from CRM [id number]. Topic: Clinical - Red Word Triage >> Mar 15, 2024  9:35 AM Rosina BIRCH wrote: Reason for RMF:enddpaoz hurting in his kidneys and frequent urination Reason for Disposition  All other males with painful urination  Answer Assessment - Initial Assessment Questions Pt states that he has a history of kidney stones and feels similar to that. He states that he is having bilateral flank pain but also urinary frequency. Denies difficulty starting stream but states that it occasionally does stop and start again. He also mentioned an area on his left groin/thigh that he thought was a mole but is getting larger.    1. SEVERITY: How bad is the pain?  (e.g., Scale 1-10; mild, moderate, or severe)     8/10 worse with movement 2. FREQUENCY: How many times have you had painful urination today?      No burning, going more frequently 3. PATTERN: Is pain present every time you urinate or just sometimes?      no 4. ONSET: When did the painful urination start?      no 5. FEVER: Do you have a fever? If Yes, ask: What is your temperature, how was it measured, and when did it start?     No fever 6. PAST UTI: Have you had a urine infection before? If Yes, ask: When was the last time? and What happened that time?      no 7. CAUSE: What do you think is causing the painful urination?      Kidney stones, last time was several years ago 8. OTHER SYMPTOMS: Do you have any other symptoms? (e.g., flank pain, penis discharge, scrotal pain, blood in urine) Flank pain yes  Protocols used: Urination Pain - Male-A-AH

## 2024-03-16 ENCOUNTER — Ambulatory Visit (INDEPENDENT_AMBULATORY_CARE_PROVIDER_SITE_OTHER): Admitting: Family Medicine

## 2024-03-16 ENCOUNTER — Telehealth: Payer: Self-pay | Admitting: Family Medicine

## 2024-03-16 ENCOUNTER — Encounter: Payer: Self-pay | Admitting: Family Medicine

## 2024-03-16 VITALS — BP 128/60 | HR 58 | Temp 97.9°F | Wt 150.2 lb

## 2024-03-16 DIAGNOSIS — Z23 Encounter for immunization: Secondary | ICD-10-CM | POA: Diagnosis not present

## 2024-03-16 DIAGNOSIS — R350 Frequency of micturition: Secondary | ICD-10-CM | POA: Diagnosis not present

## 2024-03-16 DIAGNOSIS — R1032 Left lower quadrant pain: Secondary | ICD-10-CM

## 2024-03-16 LAB — POC URINALSYSI DIPSTICK (AUTOMATED)
Bilirubin, UA: NEGATIVE
Glucose, UA: POSITIVE — AB
Ketones, UA: NEGATIVE
Leukocytes, UA: NEGATIVE
Nitrite, UA: NEGATIVE
Protein, UA: POSITIVE — AB
Spec Grav, UA: 1.02 (ref 1.010–1.025)
Urobilinogen, UA: NEGATIVE U/dL — AB
pH, UA: 5.5 (ref 5.0–8.0)

## 2024-03-16 MED ORDER — AMOXICILLIN-POT CLAVULANATE 875-125 MG PO TABS: 1.0000 | ORAL_TABLET | Freq: Two times a day (BID) | ORAL | 0 refills | Status: DC

## 2024-03-16 NOTE — Progress Notes (Signed)
 Marvin Salinas , Oct 04, 1945, 78 y.o., male MRN: [id number] Patient Care Team    Relationship Specialty Notifications Start End  Catherine Charlies LABOR, DO PCP - General Family Medicine  01/24/19   Shellia Oh, MD Consulting Physician Pulmonary Disease  01/26/19   [name], Aleene PARAS, MD (Inactive) Consulting Physician Cardiology  04/30/20   Teressa Toribio SQUIBB, MD (Inactive) Attending Physician Gastroenterology  10/23/20   Franchot Lauraine HERO, NP Nurse Practitioner Nurse Practitioner  10/23/20    Comment: Hematology  Georjean Darice HERO, MD Consulting Physician Neurology  09/01/21   Rachele Gaynell RAMAN, MD Consulting Physician Nephrology  10/28/21    Comment: [facility]  [facility]    03/28/23   Darron Deatrice LABOR, MD Consulting Physician Cardiology  03/28/23     Chief Complaint  Patient presents with   Urinary Frequency    1 week; L sided lower back pain, urinary frequency.      Subjective: Marvin Salinas is a 78 y.o. Pt presents for an OV with complaints of flank pain and frequent urination of 1 week duration.  He denies fevers or chills.  He is tolerating p.o.  He reports bowel habits are unchanged.  In review of most recent CT abdomen in the system and colonoscopy, patient has diverticulosis along the descending and sigmoid colon. Pt has tried nothing to ease their symptoms.  He also reports he has had rather significant low back pain from his orthopedic issue.  Patient denies burning with urination. He has a h/o kidney stones, but has not had a stone in a few decades.      01/02/2024    9:55 AM 03/28/2023    8:36 AM 03/21/2023    1:07 PM 10/11/2022    8:20 AM 03/31/2022   11:12 AM  Depression screen PHQ 2/9  Decreased Interest 0 1 0 1 0  Down, Depressed, Hopeless 0 1 0 1 0  PHQ - 2 Score 0 2 0 2 0  Altered sleeping  1  1   Tired, decreased energy  1  1   Change in appetite  0  0   Feeling bad or failure about yourself   0  0   Trouble concentrating  0  0   Moving slowly or  fidgety/restless  0  0   Suicidal thoughts  0  0   PHQ-9 Score  4  4   Difficult doing work/chores  Not difficult at all  Somewhat difficult       07/02/2022    1:27 PM 10/11/2022    8:20 AM 03/21/2023    9:22 AM 03/28/2023    8:35 AM 09/08/2023    9:04 AM  Fall Risk  Falls in the past year?  1 0 0 0  Was there an injury with Fall?  0 0 0   Fall Risk Category Calculator  2 0 0   (RETIRED) Patient Fall Risk Level High fall risk       Patient at Risk for Falls Due to   No Fall Risks    Fall risk Follow up  Falls evaluation completed Falls evaluation completed Falls evaluation completed      Data saved with a previous flowsheet row definition    Allergies  Allergen Reactions   Cymbalta  [Duloxetine  Hcl] Other (See Comments)    Halucinations   Codeine Nausea And Vomiting   Lisinopril Cough   Nsaids Other (See Comments)    CKD  Dapagliflozin Rash   Latex Hives   Social History   Social History Narrative   Marital status/children/pets: divorced.   Education/employment: retired, Patient has his GED. 9th grade education.    Safety:      -smoke alarm in the home:Yes     - wears seatbelt: Yes   Patient is right handed.   Patient does not drink any caffeine.   Lives in a one story home    Past Medical History:  Diagnosis Date   Acute kidney injury superimposed on chronic kidney disease 10/12/2017   Atrial fibrillation with RVR (HCC) 10/27/2017   Back pain    Basilar artery stenosis    on chronic Plavix    Benign prostatic hypertrophy without urinary obstruction 04/17/2014   Carotid artery disease 10/06/2010   Carotid US  04/2019: Bilat ICA 40-59; L subclavian stenosis  // Carotid US  11/21: Bilat ICA 40-59; bilateral subclavian stenosis // Carotid US  11/22: Bilateral ICA 40-59; left subclavian stenosis // Carotid US  04/28/2022: Bilateral ICA 40-59; left subclavian stenosis   Cerebral vascular disease    with prior TIA's; followed by Dr. Maurice   Depression, neurotic 11/21/2013    Diabetes mellitus    on insulin    Enthesopathy of ankle and tarsus 09/04/2007   Overview:  Metatarsalgia    Enthesopathy of ankle and tarsus 09/04/2007   Overview:  Metatarsalgia  Formatting of this note might be different from the original. Metatarsalgia  10/1 IMO update   History of renal calculi    Hyperlipidemia    Hypertension    Ischemic heart disease    prior PCI to RCA in 1989. S/P PCI to LAD and OM in 1992. S/P PCI to first DX in 2000. S/P CABG x 3 in May 2011   Lambda light chain myeloma (HCC) 10/28/2021   Left hip pain 01/03/2020   OSA (obstructive sleep apnea) 01/11/2018   AHI 18.1 and SaO2 low 73%   PAD (peripheral artery disease) 11/18/2017   ABIs/Arterial US  04/2019: R 1.21; L 0.66 // R SFA 30-49, stable > 50 CIA and EIA stenosis; L > 50 CIA stenosis (likely represents severe stenosis or short segment occlusion)   Peripheral neuropathy    Pneumonia 02/2011   Sacroiliac joint dysfunction of left side 01/03/2020   Stroke (HCC) 04/16/2001   small right cerebellar infarct on 04/16/2001 at that time he was found to have proximal left vertebral artery, proximal left common carotid artery and both external carotid artery stenosis as well as intracranial stenosis involving mid basilar artery- 07/2011 add questionable TIA   Past Surgical History:  Procedure Laterality Date    NASAL ENDOSCOPY  01/11/2020   chronic rhinitis w/o evidence of acute sinusitis, bilateral inferior turbinate hypertrophy   ABDOMINAL AORTOGRAM W/LOWER EXTREMITY Bilateral 05/30/2019   Procedure: ABDOMINAL AORTOGRAM W/LOWER EXTREMITY;  Surgeon: Darron Deatrice LABOR, MD;  Location: [facility];  Service: Cardiovascular;  Laterality: Bilateral;   ANGIOPLASTY  1989   right coronary artery   ANGIOPLASTY  1992   LAD and OM   ANGIOPLASTY  1998   First DX   BIOPSY  05/06/2023   Procedure: BIOPSY;  Surgeon: Stacia Glendia BRAVO, MD;  Location: [facility];  Service: Gastroenterology;;   BRAIN SURGERY     on  prior records   CORONARY ARTERY BYPASS GRAFT  11/12/2009   LIMA to LAD, SVG to OM and SVG to RCA   CORONARY STENT PLACEMENT  2000   Stent to LAD/Circumflex with angioplasty to first diagonal  ENTEROSCOPY N/A 05/06/2023   Procedure: ENTEROSCOPY;  Surgeon: Stacia Glendia BRAVO, MD;  Location: [facility];  Service: Gastroenterology;  Laterality: N/A;   HOT HEMOSTASIS N/A 05/06/2023   Procedure: HOT HEMOSTASIS (ARGON PLASMA COAGULATION/BICAP);  Surgeon: Stacia Glendia BRAVO, MD;  Location: [facility];  Service: Gastroenterology;  Laterality: N/A;   LEFT HEART CATH AND CORS/GRAFTS ANGIOGRAPHY N/A 10/13/2017   Procedure: LEFT HEART CATH AND CORS/GRAFTS ANGIOGRAPHY;  Surgeon: Mady Bruckner, MD;  Location: [facility];  Service: Cardiovascular;  Laterality: N/A;   SUBMUCOSAL TATTOO INJECTION  05/06/2023   Procedure: SUBMUCOSAL TATTOO INJECTION;  Surgeon: Stacia Glendia BRAVO, MD;  Location: [facility];  Service: Gastroenterology;;   Family History  Problem Relation Age of Onset   Depression Mother    Early death Mother    Kidney disease Mother    Ovarian cancer Mother    Hypertension Father    Heart disease Father    Heart attack Father    Asthma Brother    Diabetes Brother    Stroke Other        Uncle   Diabetes Sister    Colon cancer Neg Hx    Rectal cancer Neg Hx    Stomach cancer Neg Hx    Esophageal cancer Neg Hx    Allergies as of 03/16/2024       Reactions   Cymbalta  [duloxetine  Hcl] Other (See Comments)   Halucinations   Codeine Nausea And Vomiting   Lisinopril Cough   Nsaids Other (See Comments)   CKD   Dapagliflozin Rash   Latex Hives        Medication List        Accurate as of March 16, 2024  2:53 PM. If you have any questions, ask your nurse or doctor.          acetaminophen  500 MG tablet Commonly known as: TYLENOL  Take 1,000 mg by mouth every 6 (six) hours as needed for mild pain (pain score 1-3) or moderate pain (pain score 4-6).    amoxicillin -clavulanate 875-125 MG tablet Commonly known as: AUGMENTIN  Take 1 tablet by mouth 2 (two) times daily. Started by: Charlies Bellini   apixaban  5 MG Tabs tablet Commonly known as: Eliquis  Take 1 tablet (5 mg total) by mouth 2 (two) times daily. Further refills from cardio   BD Pen Needle Nano 2nd Gen 32G X 4 MM Misc Generic drug: Insulin  Pen Needle PLACE 1 NEEDLE ONTO THE SKIN IN THE MORNING AND AT BEDTIME.   carvedilol  3.125 MG tablet Commonly known as: COREG  TAKE 1 TABLET BY MOUTH 2 TIMES DAILY.   dofetilide  125 MCG capsule Commonly known as: TIKOSYN  TAKE 1 CAPSULE BY MOUTH TWICE A DAY   empagliflozin  10 MG Tabs tablet Commonly known as: Jardiance  Take 1 tablet (10 mg total) by mouth daily before breakfast.   ezetimibe  10 MG tablet Commonly known as: ZETIA  Take 1 tablet (10 mg total) by mouth daily.   famciclovir  250 MG tablet Commonly known as: FAMVIR  TAKE 1 TABLET BY MOUTH EVERY DAY   GENTEAL OP Place 1 drop into both eyes daily as needed (dry eyes).   insulin  glargine 100 UNIT/ML Solostar Pen Commonly known as: LANTUS  20 units BID   nitroGLYCERIN  0.4 MG SL tablet Commonly known as: NITROSTAT  Place 0.4 mg under the tongue every 5 (five) minutes as needed for chest pain.   ondansetron  8 MG tablet Commonly known as: ZOFRAN  TAKE 1 TABLET BY MOUTH EVERY 8 HOURS AS NEEDED FOR NAUSEA OR  VOMITING   OneTouch Delica Plus Lancet33G Misc USE UP TO FOUR TIMES DAILY AS DIRECTED   OneTouch Verio test strip Generic drug: glucose blood USE UP TO FOUR TIMES DAILY AS DIRECTED.   rosuvastatin  20 MG tablet Commonly known as: CRESTOR  Take 1 tablet (20 mg total) by mouth at bedtime.   traZODone  50 MG tablet Commonly known as: DESYREL  Take 1 tablet (50 mg total) by mouth at bedtime as needed for sleep.        All past medical history, surgical history, allergies, family history, immunizations andmedications were updated in the EMR today and reviewed under the  history and medication portions of their EMR.     Review of Systems  Constitutional:  Positive for malaise/fatigue. Negative for chills and fever.  HENT: Negative.    Eyes: Negative.   Respiratory: Negative.    Cardiovascular: Negative.   Gastrointestinal:  Negative for abdominal pain, blood in stool, constipation, diarrhea, nausea and vomiting.  Genitourinary:  Positive for flank pain, frequency and urgency. Negative for dysuria and hematuria.  Musculoskeletal:  Negative for myalgias.  Skin:  Negative for rash.  Neurological:  Negative for dizziness and headaches.   Negative, with the exception of above mentioned in HPI   Objective:  BP 128/60   Pulse (!) 58   Temp 97.9 F (36.6 C)   Wt 150 lb 3.2 oz (68.1 kg)   SpO2 97%   BMI 23.52 kg/m  Body mass index is 23.52 kg/m. Physical Exam Vitals and nursing note reviewed. Exam conducted with a chaperone present.  Constitutional:      General: He is not in acute distress.    Appearance: Normal appearance. He is not ill-appearing, toxic-appearing or diaphoretic.  HENT:     Head: Normocephalic and atraumatic.  Eyes:     General: No scleral icterus.       Right eye: No discharge.        Left eye: No discharge.     Extraocular Movements: Extraocular movements intact.     Pupils: Pupils are equal, round, and reactive to light.  Cardiovascular:     Rate and Rhythm: Normal rate and regular rhythm.  Pulmonary:     Effort: Pulmonary effort is normal. No respiratory distress.     Breath sounds: Normal breath sounds. No wheezing, rhonchi or rales.  Abdominal:     General: Abdomen is flat. Bowel sounds are normal. There is distension.     Palpations: Abdomen is soft.     Tenderness: There is abdominal tenderness (Left lower quadrant). There is left CVA tenderness. There is no right CVA tenderness, guarding or rebound.  Musculoskeletal:     Right lower leg: No edema.     Left lower leg: No edema.  Skin:    General: Skin is warm.      Findings: No rash.  Neurological:     Mental Status: He is alert and oriented to person, place, and time. Mental status is at baseline.  Psychiatric:        Mood and Affect: Mood normal.        Behavior: Behavior normal.        Thought Content: Thought content normal.        Judgment: Judgment normal.      No results found. No results found. Results for orders placed or performed in visit on 03/16/24 (from the past 24 hours)  POCT Urinalysis Dipstick (Automated)     Status: Abnormal   Collection Time: 03/16/24  9:42  AM  Result Value Ref Range   Color, UA yellow    Clarity, UA clear    Glucose, UA Positive (A) Negative   Bilirubin, UA negative    Ketones, UA negative    Spec Grav, UA 1.020 1.010 - 1.025   Blood, UA 1+    pH, UA 5.5 5.0 - 8.0   Protein, UA Positive (A) Negative   Urobilinogen, UA negative (A) 0.2 or 1.0 E.U./dL   Nitrite, UA negative    Leukocytes, UA Negative Negative    Assessment/Plan: Marvin Salinas is a 78 y.o. male present for OV for  Urinary frequency (Primary) - POCT Urinalysis Dipstick (Automated)-negative leuks, trace blood - Urinalysis w microscopic + reflex cultur-pending - CT ABDOMEN PELVIS W CONTRAST; Future  Influenza vaccine needed declined  Left lower quadrant abdominal pain Discussed concern for possible diverticulitis flare versus kidney stone as cause of discomfort.  He is tender both at the left CVA and left lower quadrant. Stat CT, CBC, CRP and CMP had been ordered.  Plan discussed with patient for call back later today with results and discuss further plan.  Unfortunately, will call and schedule patient did not want to schedule the CT until Monday and he passed by lab without collection. - CT ABDOMEN PELVIS W CONTRAST; Future Given patient delayed further workup, elected to call Levaquin  renally dosed to cover possible urinary versus abdominal infection while we wait on CT result. Discontinued labs since they will not be necessary  after start of Levaquin  and CT is received  Reviewed expectations re: course of current medical issues. Discussed self-management of symptoms. Outlined signs and symptoms indicating need for more acute intervention. Patient verbalized understanding and all questions were answered. Patient received an After-Visit Summary.    Orders Placed This Encounter  Procedures   CT ABDOMEN PELVIS W CONTRAST   Urinalysis w microscopic + reflex cultur   POCT Urinalysis Dipstick (Automated)   Meds ordered this encounter  Medications   amoxicillin -clavulanate (AUGMENTIN ) 875-125 MG tablet    Sig: Take 1 tablet by mouth 2 (two) times daily.    Dispense:  20 tablet    Refill:  0   Referral Orders  No referral(s) requested today     Note is dictated utilizing voice recognition software. Although note has been proof read prior to signing, occasional typographical errors still can be missed. If any questions arise, please do not hesitate to call for verification.   electronically signed by:  Charlies Bellini, DO  [facility] - [place]

## 2024-03-16 NOTE — Telephone Encounter (Signed)
 Please call patient He had passed blood without lab collection today.  Even the CT that I wanted completed today for evaluation, he elected not scheduled till Monday.  Therefore I canceled the labs, and we will wait for CT results on Monday.  In the meantime I did call in Levaquin  to treat possible urine/kidney infection versus colon infection while we wait on the CT result. The Levaquin  is every 48 hours-1 tab every 2 days.  Is very important he understands this please make sure he is able to repeat this back to you

## 2024-03-16 NOTE — Telephone Encounter (Signed)
 LVM to discuss

## 2024-03-16 NOTE — Patient Instructions (Signed)
 No follow-ups on file.        Great to see you today.  I have refilled the medication(s) we provide.   If labs were collected or images ordered, we will inform you of  results once we have received them and reviewed. We will contact you either by echart message, or telephone call.  Please give ample time to the testing facility, and our office to run,  receive and review results. Please do not call inquiring of results, even if you can see them in your chart. We will contact you as soon as we are able. If it has been over 1 week since the test was completed, and you have not yet heard from us , then please call us .    - echart message- for normal results that have been seen by the patient already.   - telephone call: abnormal results or if patient has not viewed results in their echart.  If a referral to a specialist was entered for you, please call us  in 2 weeks if you have not heard from the specialist office to schedule.

## 2024-03-17 LAB — URINALYSIS W MICROSCOPIC + REFLEX CULTURE
Bacteria, UA: NONE SEEN /HPF
Bilirubin Urine: NEGATIVE
Hgb urine dipstick: NEGATIVE
Hyaline Cast: NONE SEEN /LPF
Ketones, ur: NEGATIVE
Leukocyte Esterase: NEGATIVE
Nitrites, Initial: NEGATIVE
Protein, ur: NEGATIVE
RBC / HPF: NONE SEEN /HPF (ref 0–2)
Specific Gravity, Urine: 1.039 — ABNORMAL HIGH (ref 1.001–1.035)
Squamous Epithelial / HPF: NONE SEEN /HPF (ref <=5)
WBC, UA: NONE SEEN /HPF (ref 0–5)
pH: <=5 — AB (ref 5.0–8.0)

## 2024-03-17 LAB — NO CULTURE INDICATED

## 2024-03-19 ENCOUNTER — Ambulatory Visit: Payer: Self-pay | Admitting: Family Medicine

## 2024-03-19 ENCOUNTER — Ambulatory Visit (HOSPITAL_BASED_OUTPATIENT_CLINIC_OR_DEPARTMENT_OTHER)
Admission: RE | Admit: 2024-03-19 | Discharge: 2024-03-19 | Disposition: A | Discharge status: Home / Self Care | Source: Ambulatory Visit | Attending: Family Medicine | Admitting: Family Medicine

## 2024-03-19 DIAGNOSIS — R109 Unspecified abdominal pain: Secondary | ICD-10-CM | POA: Diagnosis not present

## 2024-03-19 DIAGNOSIS — R350 Frequency of micturition: Secondary | ICD-10-CM | POA: Insufficient documentation

## 2024-03-19 DIAGNOSIS — R1032 Left lower quadrant pain: Secondary | ICD-10-CM | POA: Insufficient documentation

## 2024-03-19 DIAGNOSIS — K5730 Diverticulosis of large intestine without perforation or abscess without bleeding: Secondary | ICD-10-CM | POA: Diagnosis not present

## 2024-03-19 LAB — POCT I-STAT CREATININE: Creatinine, Ser: 1.5 mg/dL — ABNORMAL HIGH (ref 0.61–1.24)

## 2024-03-19 MED ORDER — IOHEXOL 300 MG/ML  SOLN
80.0000 mL | Freq: Once | INTRAMUSCULAR | Status: AC | PRN
  Administered 2024-03-19: 70 mL via INTRAVENOUS

## 2024-03-19 NOTE — Telephone Encounter (Signed)
 Please call patient His CT abdomen does not show any concerning features of his colon or kidneys to be causing his symptoms along the left flank/abdomen. He Does have a possible gallbladder stone, but admits on the other side of his stomach current symptoms.  Recommend he finish the medication prescribed on Friday.  It may be that nothing is seen on CT, because we went ahead and started treatment over the weekend.  If symptoms still remain, this may be referred pain from his lower back.

## 2024-03-20 NOTE — Telephone Encounter (Signed)
LVM. Sent MyChart.

## 2024-04-03 ENCOUNTER — Inpatient Hospital Stay: Attending: Family

## 2024-04-03 ENCOUNTER — Encounter: Payer: Self-pay | Admitting: Hematology & Oncology

## 2024-04-03 ENCOUNTER — Inpatient Hospital Stay

## 2024-04-03 ENCOUNTER — Inpatient Hospital Stay: Admitting: Hematology & Oncology

## 2024-04-03 VITALS — BP 165/49 | HR 54 | Temp 97.6°F | Resp 20 | Ht 67.0 in | Wt 150.0 lb

## 2024-04-03 DIAGNOSIS — Z79899 Other long term (current) drug therapy: Secondary | ICD-10-CM | POA: Diagnosis not present

## 2024-04-03 DIAGNOSIS — L989 Disorder of the skin and subcutaneous tissue, unspecified: Secondary | ICD-10-CM | POA: Insufficient documentation

## 2024-04-03 DIAGNOSIS — D500 Iron deficiency anemia secondary to blood loss (chronic): Secondary | ICD-10-CM

## 2024-04-03 DIAGNOSIS — K922 Gastrointestinal hemorrhage, unspecified: Secondary | ICD-10-CM | POA: Insufficient documentation

## 2024-04-03 DIAGNOSIS — N189 Chronic kidney disease, unspecified: Secondary | ICD-10-CM | POA: Insufficient documentation

## 2024-04-03 DIAGNOSIS — C9000 Multiple myeloma not having achieved remission: Secondary | ICD-10-CM | POA: Insufficient documentation

## 2024-04-03 DIAGNOSIS — R634 Abnormal weight loss: Secondary | ICD-10-CM | POA: Diagnosis not present

## 2024-04-03 DIAGNOSIS — D631 Anemia in chronic kidney disease: Secondary | ICD-10-CM | POA: Diagnosis not present

## 2024-04-03 LAB — CBC WITH DIFFERENTIAL (CANCER CENTER ONLY)
Abs Immature Granulocytes: 0.01 K/uL (ref 0.00–0.07)
Basophils Absolute: 0 K/uL (ref 0.0–0.1)
Basophils Relative: 1 %
Eosinophils Absolute: 0.1 K/uL (ref 0.0–0.5)
Eosinophils Relative: 2 %
HCT: 50.9 % (ref 39.0–52.0)
Hemoglobin: 17 g/dL (ref 13.0–17.0)
Immature Granulocytes: 0 %
Lymphocytes Relative: 22 %
Lymphs Abs: 1.6 K/uL (ref 0.7–4.0)
MCH: 30.1 pg (ref 26.0–34.0)
MCHC: 33.4 g/dL (ref 30.0–36.0)
MCV: 90.1 fL (ref 80.0–100.0)
Monocytes Absolute: 0.5 K/uL (ref 0.1–1.0)
Monocytes Relative: 7 %
Neutro Abs: 5 K/uL (ref 1.7–7.7)
Neutrophils Relative %: 68 %
Platelet Count: 169 K/uL (ref 150–400)
RBC: 5.65 MIL/uL (ref 4.22–5.81)
RDW: 13.3 % (ref 11.5–15.5)
WBC Count: 7.3 K/uL (ref 4.0–10.5)
nRBC: 0 % (ref 0.0–0.2)

## 2024-04-03 LAB — RETICULOCYTES
Immature Retic Fract: 9.7 % (ref 2.3–15.9)
RBC.: 5.63 MIL/uL (ref 4.22–5.81)
Retic Count, Absolute: 63.1 K/uL (ref 19.0–186.0)
Retic Ct Pct: 1.1 % (ref 0.4–3.1)

## 2024-04-03 LAB — CMP (CANCER CENTER ONLY)
ALT: 40 U/L (ref 0–44)
AST: 34 U/L (ref 15–41)
Albumin: 4.6 g/dL (ref 3.5–5.0)
Alkaline Phosphatase: 67 U/L (ref 38–126)
Anion gap: 11 (ref 5–15)
BUN: 16 mg/dL (ref 8–23)
CO2: 27 mmol/L (ref 22–32)
Calcium: 9.4 mg/dL (ref 8.9–10.3)
Chloride: 106 mmol/L (ref 98–111)
Creatinine: 1.33 mg/dL — ABNORMAL HIGH (ref 0.61–1.24)
GFR, Estimated: 54 mL/min — ABNORMAL LOW (ref >60)
Glucose, Bld: 104 mg/dL — ABNORMAL HIGH (ref 70–99)
Potassium: 4.1 mmol/L (ref 3.5–5.1)
Sodium: 144 mmol/L (ref 135–145)
Total Bilirubin: 0.6 mg/dL (ref 0.0–1.2)
Total Protein: 6.5 g/dL (ref 6.5–8.1)

## 2024-04-03 LAB — FERRITIN: Ferritin: 79 ng/mL (ref 24–336)

## 2024-04-03 LAB — IRON AND IRON BINDING CAPACITY (CC-WL,HP ONLY)
Iron: 116 ug/dL (ref 45–182)
Saturation Ratios: 31 % (ref 17.9–39.5)
TIBC: 370 ug/dL (ref 250–450)
UIBC: 254 ug/dL

## 2024-04-03 NOTE — Progress Notes (Signed)
 Hematology and Oncology Follow Up Visit  Marvin Salinas [id number] [birth year] 78 y.o. 04/03/2024   Principle Diagnosis:  Lambda light chain myeloma --normal cytogenetics Anemia of renal sufficiency Iron  deficiency anemia secondary to GI bleed   Current Therapy:        Faspro/Velcade /Revlimid  -- started on 11/05/2021 - monthly -- revlimid  stopped in 09/2022.  All therapy stopped on 02/11/2023 Aranesp  300 mcg subcu for hemoglobin less than 11 Venofer  200 mg IV given on 09/22/2023    Interim History:  Marvin Salinas is here today for follow-up.  He has been off treatment now for probably 14 months.  So far, there is been no evidence that the light chain myeloma has come back.  His labs all seem okay.  His last lambda light chain was 1.3 mg/dL.  He is losing some weight.  I am not sure as to why he is losing the weight.  He is eating okay.  He is not having diarrhea.  He has had no rashes.  He said that in the upper inner left thigh, he did notice a lesion.  I looked at it.  It is a macular type lesion.  It is oval in size.  It probably measures about 4 x 5 mm.  Again I am not sure exactly what it is.  He has had no problems with a cough.  He has had no shortness of breath.  He has had no fever.  He has had no COVID exposure.  Overall, I would have to say that his performance status is probably ECOG 1.    Wt Readings from Last 3 Encounters:  04/03/24 150 lb (68 kg)  03/16/24 150 lb 3.2 oz (68.1 kg)  01/02/24 155 lb 4.8 oz (70.4 kg)     Medications:  Allergies as of 04/03/2024       Reactions   Cymbalta  [duloxetine  Hcl] Other (See Comments)   Halucinations   Codeine Nausea And Vomiting   Lisinopril Cough   Nsaids Other (See Comments)   CKD   Dapagliflozin Rash   Latex Hives        Medication List        Accurate as of April 03, 2024 10:25 AM. If you have any questions, ask your nurse or doctor.          STOP taking these medications    amoxicillin -clavulanate  875-125 MG tablet Commonly known as: AUGMENTIN  Stopped by: Ambra Haverstick R Anayeli Arel   GENTEAL OP Stopped by: Fenton Candee R Naava Janeway       TAKE these medications    acetaminophen  500 MG tablet Commonly known as: TYLENOL  Take 1,000 mg by mouth every 6 (six) hours as needed for mild pain (pain score 1-3) or moderate pain (pain score 4-6).   apixaban  5 MG Tabs tablet Commonly known as: Eliquis  Take 1 tablet (5 mg total) by mouth 2 (two) times daily. Further refills from cardio   BD Pen Needle Nano 2nd Gen 32G X 4 MM Misc Generic drug: Insulin  Pen Needle PLACE 1 NEEDLE ONTO THE SKIN IN THE MORNING AND AT BEDTIME.   carvedilol  3.125 MG tablet Commonly known as: COREG  TAKE 1 TABLET BY MOUTH 2 TIMES DAILY.   dofetilide  125 MCG capsule Commonly known as: TIKOSYN  TAKE 1 CAPSULE BY MOUTH TWICE A DAY   empagliflozin  10 MG Tabs tablet Commonly known as: Jardiance  Take 1 tablet (10 mg total) by mouth daily before breakfast.   ezetimibe  10 MG tablet Commonly known as: ZETIA  Take 1 tablet (  10 mg total) by mouth daily.   famciclovir  250 MG tablet Commonly known as: FAMVIR  TAKE 1 TABLET BY MOUTH EVERY DAY   insulin  glargine 100 UNIT/ML Solostar Pen Commonly known as: LANTUS  20 units BID What changed: additional instructions   nitroGLYCERIN  0.4 MG SL tablet Commonly known as: NITROSTAT  Place 0.4 mg under the tongue every 5 (five) minutes as needed for chest pain.   ondansetron  8 MG tablet Commonly known as: ZOFRAN  TAKE 1 TABLET BY MOUTH EVERY 8 HOURS AS NEEDED FOR NAUSEA OR VOMITING   OneTouch Delica Plus Lancet33G Misc USE UP TO FOUR TIMES DAILY AS DIRECTED   OneTouch Verio test strip Generic drug: glucose blood USE UP TO FOUR TIMES DAILY AS DIRECTED. What changed: additional instructions   rosuvastatin  20 MG tablet Commonly known as: CRESTOR  Take 1 tablet (20 mg total) by mouth at bedtime.   traZODone  50 MG tablet Commonly known as: DESYREL  Take 1 tablet (50 mg total) by mouth  at bedtime as needed for sleep.        Allergies:  Allergies  Allergen Reactions   Cymbalta  [Duloxetine  Hcl] Other (See Comments)    Halucinations   Codeine Nausea And Vomiting   Lisinopril Cough   Nsaids Other (See Comments)    CKD   Dapagliflozin Rash   Latex Hives    Past Medical History, Surgical history, Social history, and Family History were reviewed and updated.  Review of Systems: Review of Systems  Constitutional: Negative.   HENT: Negative.    Eyes: Negative.   Respiratory: Negative.    Cardiovascular: Negative.   Gastrointestinal: Negative.   Genitourinary: Negative.   Musculoskeletal: Negative.   Skin: Negative.   Neurological: Negative.   Endo/Heme/Allergies: Negative.   Psychiatric/Behavioral: Negative.       Physical Exam:  height is 5' 7 (1.702 m) and weight is 150 lb (68 kg). His oral temperature is 97.6 F (36.4 C). His blood pressure is 165/49 (abnormal) and his pulse is 54 (abnormal). His respiration is 20 and oxygen saturation is 98%.   Wt Readings from Last 3 Encounters:  04/03/24 150 lb (68 kg)  03/16/24 150 lb 3.2 oz (68.1 kg)  01/02/24 155 lb 4.8 oz (70.4 kg)    Physical Exam Vitals reviewed.  HENT:     Head: Normocephalic and atraumatic.  Eyes:     Pupils: Pupils are equal, round, and reactive to light.  Cardiovascular:     Rate and Rhythm: Normal rate and regular rhythm.     Heart sounds: Normal heart sounds.  Pulmonary:     Effort: Pulmonary effort is normal.     Breath sounds: Normal breath sounds.  Abdominal:     General: Bowel sounds are normal.     Palpations: Abdomen is soft.  Musculoskeletal:        General: No tenderness or deformity. Normal range of motion.     Cervical back: Normal range of motion.  Lymphadenopathy:     Cervical: No cervical adenopathy.  Skin:    General: Skin is warm and dry.     Findings: No erythema or rash.  Neurological:     Mental Status: He is alert and oriented to person, place, and  time.  Psychiatric:        Behavior: Behavior normal.        Thought Content: Thought content normal.        Judgment: Judgment normal.      Lab Results  Component Value Date   WBC  7.3 04/03/2024   HGB 17.0 04/03/2024   HCT 50.9 04/03/2024   MCV 90.1 04/03/2024   PLT 169 04/03/2024   Lab Results  Component Value Date   FERRITIN 61 08/26/2023   IRON  69 08/26/2023   TIBC 420 08/26/2023   UIBC 351 08/26/2023   IRONPCTSAT 16 (L) 08/26/2023   Lab Results  Component Value Date   RETICCTPCT 1.1 04/03/2024   RBC 5.63 04/03/2024   RBC 5.65 04/03/2024   Lab Results  Component Value Date   KPAFRELGTCHN 13.9 01/02/2024   LAMBDASER 12.7 01/02/2024   KAPLAMBRATIO 3.14 01/19/2024   Lab Results  Component Value Date   IGGSERUM 349 (L) 01/02/2024   IGA 28 (L) 01/02/2024   IGMSERUM 10 (L) 01/02/2024   Lab Results  Component Value Date   TOTALPROTELP 6.1 10/26/2023   ALBUMINELP 3.6 10/26/2023   A1GS 0.2 10/26/2023   A2GS 0.9 10/26/2023   BETS 0.9 10/26/2023   GAMS 0.4 10/26/2023   MSPIKE Not Observed 10/26/2023   SPEI Comment 01/14/2023     Chemistry      Component Value Date/Time   NA 143 01/02/2024 0929   NA 140 08/31/2021 1026   K 4.5 01/02/2024 0929   CL 107 01/02/2024 0929   CO2 30 01/02/2024 0929   BUN 18 01/02/2024 0929   BUN 21 08/31/2021 1026   CREATININE 1.50 (H) 03/19/2024 1327   CREATININE 1.53 (H) 01/02/2024 0929      Component Value Date/Time   CALCIUM  9.5 01/02/2024 0929   ALKPHOS 59 01/02/2024 0929   AST 43 (H) 01/02/2024 0929   ALT 47 (H) 01/02/2024 0929   BILITOT 0.6 01/02/2024 0929      Impression and Plan: Marvin Salinas is a pleasant 78 yo gentlemen with history of lambda chain myeloma as well as anemia secondary to chronic renal insufficiency and iron  deficiency  I am very impressed with his blood counts.  Everything really looks fantastic from my point of view.  We will have to see what his myeloma studies look like.  From all the labs  so far, there has been no evidence that his myeloma has recurred.  I am not sure what this lesion is on the upper inner left thigh.  I think he sees a dermatologist at the end of the month.  I told him to put on some hydrocortisone cream to see if this may help.  For right now, I think we can get him through the Holiday season.  We will plan to get him back in January.  Maude JONELLE Crease, MD 10/14/202510:25 AM

## 2024-04-03 NOTE — Progress Notes (Signed)
 BP remains elevated 152/70, instructed to monitor at home and if it remains over 140/90, notify PCP, verbalized understanding.

## 2024-04-04 LAB — KAPPA/LAMBDA LIGHT CHAINS
Kappa free light chain: 15.3 mg/L (ref 3.3–19.4)
Kappa, lambda light chain ratio: 0.71 (ref 0.26–1.65)
Lambda free light chains: 21.6 mg/L (ref 5.7–26.3)

## 2024-04-05 ENCOUNTER — Telehealth: Payer: Self-pay | Admitting: Cardiovascular Disease

## 2024-04-05 MED ORDER — APIXABAN 5 MG PO TABS: 5.0000 mg | ORAL_TABLET | Freq: Two times a day (BID) | ORAL | 1 refills | Status: DC

## 2024-04-05 NOTE — Telephone Encounter (Signed)
 Prescription refill request for Eliquis  received. Indication:  A-Fib Last office visit:  09/06/2023 Scr:  1.33 last lab on 04/03/2024 Age:  78 yrs.  Weight: 68 kg  Pt meet protocol and pt's medication was sent to pt's pharmacy.

## 2024-04-05 NOTE — Telephone Encounter (Signed)
*  STAT* If patient is at the pharmacy, call can be transferred to refill team.   1. Which medications need to be refilled? (please list name of each medication and dose if known) apixaban (ELIQUIS) 5 MG TABS tablet  2. Which pharmacy/location (including street and city if local pharmacy) is medication to be sent to? [id number] - [place], [place] - [address]  3. Do they need a 30 day or 90 day supply? 90

## 2024-04-09 LAB — MULTIPLE MYELOMA PANEL, SERUM
Albumin SerPl Elph-Mcnc: 3.9 g/dL (ref 2.9–4.4)
Albumin/Glob SerPl: 1.9 — ABNORMAL HIGH (ref 0.7–1.7)
Alpha 1: 0.3 g/dL (ref 0.0–0.4)
Alpha2 Glob SerPl Elph-Mcnc: 0.8 g/dL (ref 0.4–1.0)
B-Globulin SerPl Elph-Mcnc: 0.9 g/dL (ref 0.7–1.3)
Gamma Glob SerPl Elph-Mcnc: 0.2 g/dL — ABNORMAL LOW (ref 0.4–1.8)
Globulin, Total: 2.1 g/dL — ABNORMAL LOW (ref 2.2–3.9)
IgA: 29 mg/dL — ABNORMAL LOW (ref 61–437)
IgG (Immunoglobin G), Serum: 332 mg/dL — ABNORMAL LOW (ref 603–1613)
IgM (Immunoglobulin M), Srm: 12 mg/dL — ABNORMAL LOW (ref 15–143)
Total Protein ELP: 6 g/dL (ref 6.0–8.5)

## 2024-04-11 ENCOUNTER — Telehealth: Payer: Self-pay | Admitting: Dietician

## 2024-04-11 NOTE — Telephone Encounter (Signed)
 Patient screened on MST. First attempt to reach since previous nutrition encounters. Provided my cell# on voice mail to return call to set up a nutrition consult.  Micheline Craven, RDN, LDN Registered Dietitian, [facility] Part Time Remote (Usual office hours: Tuesday-Thursday) Cell: [phone]

## 2024-04-25 ENCOUNTER — Telehealth: Payer: Self-pay | Admitting: Dietician

## 2024-04-25 NOTE — Telephone Encounter (Signed)
 Patient screened on MST. Second attempt to reach since previous nutrition encounters. Provided my cell# on voice mail to return call to set up a nutrition consult.  Micheline Craven, RDN, LDN Registered Dietitian, [facility] Part Time Remote (Usual office hours: Tuesday-Thursday) Cell: [phone]

## 2024-04-27 ENCOUNTER — Other Ambulatory Visit: Payer: Self-pay | Admitting: Family Medicine

## 2024-05-03 ENCOUNTER — Other Ambulatory Visit: Payer: Self-pay | Admitting: Family Medicine

## 2024-05-07 NOTE — Progress Notes (Unsigned)
 Cardiology Office Note   Date:  05/08/2024   ID:  Marvin Salinas, DOB 03/13/1946, MRN [id number]  PCP:  Catherine Charlies LABOR, DO  Cardiologist:  Dr. Alveta  No chief complaint on file.      History of Present Illness: Marvin Salinas is a 78 y.o. male who is here today for a follow-up visit regarding peripheral arterial disease.  He has known history of coronary artery disease status post CABG in 2001, paroxysmal atrial fibrillation on anticoagulation, essential hypertension, chronic kidney disease, carotid disease, hyperlipidemia, diabetes and prior TIAs.  The patient is followed for peripheral arterial disease with previous severe claudication.  Angiography in December 2020 showed heavily calcified subtotal occlusion of the left common iliac artery with moderate disease affecting the right common iliac artery.  I performed successful angioplasty and and balloon expandable stent placement to the left common iliac artery.   He has known history of low back pain and bilateral knee joint pain.    He has known severe lumbar radiculopathy.   He quit smoking in January of 2023.  He has known history of vertigo and severe neuropathy.  He was hospitalized in November 2024 with lower GI bleed with a hemoglobin of 6.1.  He improved with transfusion.  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  His Eliquis  was resumed after 48 hours.  We elected not to resume aspirin .  Before his hospitalization when he was seen in our office he complained of chest pain worrisome for atypical angina.  He underwent a cardiac PET scan in November 2024 which showed evidence of anterior and anterolateral ischemia with an EF of 50%. His chest pain resolved once his anemia was treated.  He has been doing well overall with no recurrent chest pain or shortness of breath.  He is noted to be bradycardic today.  He reports orthostatic dizziness but no syncope or presyncope.  He finished chemotherapy for multiple  myeloma in June, 2024.  During most recent visit with me, I switched him from metoprolol  to small dose Coreg  given bradycardia.  He has been doing well overall with no chest pain or worsening dyspnea.  He checks his blood pressure at home and continues to be bradycardic with occasional dizziness.  His blood pressure is reasonably controlled at home.  He has chronic bilateral leg pain that worsened after chemotherapy likely due to neuropathy.   Past Medical History:  Diagnosis Date   Acute kidney injury superimposed on chronic kidney disease 10/12/2017   Atrial fibrillation with RVR (HCC) 10/27/2017   Back pain    Basilar artery stenosis    on chronic Plavix    Benign prostatic hypertrophy without urinary obstruction 04/17/2014   Carotid artery disease 10/06/2010   Carotid US  04/2019: Bilat ICA 40-59; L subclavian stenosis  // Carotid US  11/21: Bilat ICA 40-59; bilateral subclavian stenosis // Carotid US  11/22: Bilateral ICA 40-59; left subclavian stenosis // Carotid US  04/28/2022: Bilateral ICA 40-59; left subclavian stenosis   Cerebral vascular disease    with prior TIA's; followed by Dr. Maurice   Depression, neurotic 11/21/2013   Diabetes mellitus    on insulin    Enthesopathy of ankle and tarsus 09/04/2007   Overview:  Metatarsalgia    Enthesopathy of ankle and tarsus 09/04/2007   Overview:  Metatarsalgia  Formatting of this note might be different from the original. Metatarsalgia  10/1 IMO update   History of renal calculi    Hyperlipidemia    Hypertension    Ischemic  heart disease    prior PCI to RCA in 1989. S/P PCI to LAD and OM in 1992. S/P PCI to first DX in 2000. S/P CABG x 3 in May 2011   Lambda light chain myeloma (HCC) 10/28/2021   Left hip pain 01/03/2020   OSA (obstructive sleep apnea) 01/11/2018   AHI 18.1 and SaO2 low 73%   PAD (peripheral artery disease) 11/18/2017   ABIs/Arterial US  04/2019: R 1.21; L 0.66 // R SFA 30-49, stable > 50 CIA and EIA stenosis; L > 50 CIA  stenosis (likely represents severe stenosis or short segment occlusion)   Peripheral neuropathy    Pneumonia 02/2011   Sacroiliac joint dysfunction of left side 01/03/2020   Stroke (HCC) 04/16/2001   small right cerebellar infarct on 04/16/2001 at that time he was found to have proximal left vertebral artery, proximal left common carotid artery and both external carotid artery stenosis as well as intracranial stenosis involving mid basilar artery- 07/2011 add questionable TIA    Past Surgical History:  Procedure Laterality Date    NASAL ENDOSCOPY  01/11/2020   chronic rhinitis w/o evidence of acute sinusitis, bilateral inferior turbinate hypertrophy   ABDOMINAL AORTOGRAM W/LOWER EXTREMITY Bilateral 05/30/2019   Procedure: ABDOMINAL AORTOGRAM W/LOWER EXTREMITY;  Surgeon: Darron Deatrice LABOR, MD;  Location: [facility];  Service: Cardiovascular;  Laterality: Bilateral;   ANGIOPLASTY  1989   right coronary artery   ANGIOPLASTY  1992   LAD and OM   ANGIOPLASTY  1998   First DX   BIOPSY  05/06/2023   Procedure: BIOPSY;  Surgeon: Stacia Glendia BRAVO, MD;  Location: [facility];  Service: Gastroenterology;;   BRAIN SURGERY     on prior records   CORONARY ARTERY BYPASS GRAFT  11/12/2009   LIMA to LAD, SVG to OM and SVG to RCA   CORONARY STENT PLACEMENT  2000   Stent to LAD/Circumflex with angioplasty to first diagonal    ENTEROSCOPY N/A 05/06/2023   Procedure: ENTEROSCOPY;  Surgeon: Stacia Glendia BRAVO, MD;  Location: [facility];  Service: Gastroenterology;  Laterality: N/A;   HOT HEMOSTASIS N/A 05/06/2023   Procedure: HOT HEMOSTASIS (ARGON PLASMA COAGULATION/BICAP);  Surgeon: Stacia Glendia BRAVO, MD;  Location: [facility];  Service: Gastroenterology;  Laterality: N/A;   LEFT HEART CATH AND CORS/GRAFTS ANGIOGRAPHY N/A 10/13/2017   Procedure: LEFT HEART CATH AND CORS/GRAFTS ANGIOGRAPHY;  Surgeon: Mady Bruckner, MD;  Location: [facility];  Service: Cardiovascular;   Laterality: N/A;   SUBMUCOSAL TATTOO INJECTION  05/06/2023   Procedure: SUBMUCOSAL TATTOO INJECTION;  Surgeon: Stacia Glendia BRAVO, MD;  Location: [facility];  Service: Gastroenterology;;     Current Outpatient Medications  Medication Sig Dispense Refill   acetaminophen  (TYLENOL ) 500 MG tablet Take 1,000 mg by mouth every 6 (six) hours as needed for mild pain (pain score 1-3) or moderate pain (pain score 4-6).     amLODipine (NORVASC) 5 MG tablet Take 1 tablet (5 mg total) by mouth daily. 90 tablet 3   apixaban  (ELIQUIS ) 5 MG TABS tablet Take 1 tablet (5 mg total) by mouth 2 (two) times daily. 180 tablet 1   dofetilide  (TIKOSYN ) 125 MCG capsule TAKE 1 CAPSULE BY MOUTH TWICE A DAY 60 capsule 5   EMBECTA PEN NEEDLE NANO 2 GEN 32G X 4 MM MISC PLACE 1 NEEDLE ONTO THE SKIN IN THE MORNING AND AT BEDTIME. 200 each 0   empagliflozin  (JARDIANCE ) 10 MG TABS tablet Take 1 tablet (10 mg total) by mouth daily  before breakfast. 30 tablet 5   ezetimibe  (ZETIA ) 10 MG tablet Take 1 tablet (10 mg total) by mouth daily. 90 tablet 3   famciclovir  (FAMVIR ) 250 MG tablet TAKE 1 TABLET BY MOUTH EVERY DAY 90 tablet 1   glucose blood (ONETOUCH VERIO) test strip USE UP TO FOUR TIMES DAILY AS DIRECTED. (Patient taking differently: USE UP TO FOUR TIMES DAILY AS DIRECTED. 04/03/2024 Patient states if BS is less than 100 after breakfast, patient does not take.) 300 strip 4   insulin  glargine (LANTUS ) 100 UNIT/ML Solostar Pen 20 units BID (Patient taking differently: Patient adjusts based on blood sugar.) 90 mL 1   Lancets (ONETOUCH DELICA PLUS LANCET33G) MISC USE UP TO FOUR TIMES DAILY AS DIRECTED 400 each 1   nitroGLYCERIN  (NITROSTAT ) 0.4 MG SL tablet Place 0.4 mg under the tongue every 5 (five) minutes as needed for chest pain.     ondansetron  (ZOFRAN ) 8 MG tablet TAKE 1 TABLET BY MOUTH EVERY 8 HOURS AS NEEDED FOR NAUSEA OR VOMITING 90 tablet 3   rosuvastatin  (CRESTOR ) 20 MG tablet Take 1 tablet (20 mg total) by mouth  at bedtime. 90 tablet 3   traZODone  (DESYREL ) 50 MG tablet Take 1 tablet (50 mg total) by mouth at bedtime as needed for sleep. 90 tablet 1   No current facility-administered medications for this visit.    Allergies:   Cymbalta  [duloxetine  hcl], Codeine, Lisinopril, Nsaids, Dapagliflozin, and Latex    Social History:  The patient  reports that he quit smoking about 22 months ago. His smoking use included cigarettes. He started smoking about 55 years ago. He has a 13.5 pack-year smoking history. He has never used smokeless tobacco. He reports that he does not drink alcohol  and does not use drugs.   Family History:  The patient's family history includes Asthma in his brother; Depression in his mother; Diabetes in his brother and sister; Early death in his mother; Heart attack in his father; Heart disease in his father; Hypertension in his father; Kidney disease in his mother; Ovarian cancer in his mother; Stroke in an other family member.    ROS:  Please see the history of present illness.   Otherwise, review of systems are positive for none.   All other systems are reviewed and negative.    PHYSICAL EXAM: VS:  BP (!) 162/67 (BP Location: Left Arm, Patient Position: Sitting, Cuff Size: Normal)   Pulse (!) 51   Ht 5' 8 (1.727 m)   Wt 140 lb (63.5 kg)   SpO2 97%   BMI 21.29 kg/m  , BMI Body mass index is 21.29 kg/m. GEN: Well nourished, well developed, in no acute distress  HEENT: normal  Neck: no JVD,  or masses .  Bilateral carotid bruits. Cardiac: RRR; no murmurs, rubs, or gallops,no edema  Respiratory:  clear to auscultation bilaterally, normal work of breathing GI: soft, nontender, nondistended, + BS MS: no deformity or atrophy  Skin: warm and dry, no rash Neuro:  Strength and sensation are intact Psych: euthymic mood, full affect  Vascular: Distal pulses are not palpable.  EKG:  EKG is ordered today. EKG showed:  Marked sinus bradycardia Left axis deviation Septal infarct  , age undetermined When compared with ECG of 31-May-2023 08:22, Vent. rate has decreased BY  25 BPM Septal infarct is now Present QT has shortened    Recent Labs: 04/03/2024: ALT 40; BUN 16; Creatinine 1.33; Hemoglobin 17.0; Platelet Count 169; Potassium 4.1; Sodium 144    Lipid  Panel    Component Value Date/Time   CHOL 123 03/17/2023 0130   CHOL 120 08/27/2022 1429   TRIG 172 (H) 03/17/2023 0130   HDL 44 03/17/2023 0130   HDL 60 08/27/2022 1429   CHOLHDL 2.8 03/17/2023 0130   VLDL 34 03/17/2023 0130   LDLCALC 45 03/17/2023 0130   LDLCALC 41 08/27/2022 1429      Wt Readings from Last 3 Encounters:  05/08/24 140 lb (63.5 kg)  04/03/24 150 lb (68 kg)  03/16/24 150 lb 3.2 oz (68.1 kg)           11/30/2016    7:59 AM  PAD Screen  Previous PAD dx? Yes  Previous surgical procedure? No  Pain with walking? Yes  Subsides with rest? No  Feet/toe relief with dangling? Yes  Painful, non-healing ulcers? No  Extremities discolored? No      ASSESSMENT AND PLAN:  1.  Peripheral arterial disease: Status post  stent placement to the left common iliac artery for subtotal occlusion.   Most recent Doppler showed patent stent with mild to moderate disease overall..  Continue medical therapy.  He has a lot of neuropathic pain due to his lumbar spine disease and neuropathy related to chemotherapy.  2. Coronary artery disease with other forms of angina: Abnormal cardiac PET scan.  His angina resolved once his acidemia was treated.  Continue medical therapy for now.  3. Tobacco use: He quit smoking in 2023.  4. Carotid disease: Most recent carotid Doppler showed stable moderate bilateral disease.  Recommend a follow-up carotid Doppler in July 2026.  5. Hyperlipidemia: Most recent Doppler showed an LDL of 41.  Continue rosuvastatin  and ezetimibe .  6.  Persistent atrial fibrillation: Maintaining in sinus rhythm with dofetilide .  On long-term anticoagulation with Eliquis .  Given  persistent bradycardia, I discontinued carvedilol  today.  7.  Essential hypertension: I stop carvedilol  and switch him to amlodipine 5 mg daily.   Disposition:   FU with me in 6 month.   Signed,  Deatrice Cage, MD  05/08/2024 9:13 AM    [facility]

## 2024-05-08 ENCOUNTER — Encounter: Payer: Self-pay | Admitting: Cardiovascular Disease

## 2024-05-08 ENCOUNTER — Ambulatory Visit: Attending: Cardiovascular Disease | Admitting: Cardiovascular Disease

## 2024-05-08 VITALS — BP 162/67 | HR 51 | Ht 68.0 in | Wt 140.0 lb

## 2024-05-08 DIAGNOSIS — I739 Peripheral vascular disease, unspecified: Secondary | ICD-10-CM | POA: Diagnosis not present

## 2024-05-08 DIAGNOSIS — I6523 Occlusion and stenosis of bilateral carotid arteries: Secondary | ICD-10-CM

## 2024-05-08 DIAGNOSIS — I4819 Other persistent atrial fibrillation: Secondary | ICD-10-CM

## 2024-05-08 DIAGNOSIS — I25118 Atherosclerotic heart disease of native coronary artery with other forms of angina pectoris: Secondary | ICD-10-CM

## 2024-05-08 DIAGNOSIS — E785 Hyperlipidemia, unspecified: Secondary | ICD-10-CM

## 2024-05-08 MED ORDER — AMLODIPINE BESYLATE 5 MG PO TABS: 5.0000 mg | ORAL_TABLET | Freq: Every day | ORAL | 3 refills | Status: AC

## 2024-05-08 NOTE — Patient Instructions (Signed)
 Medication Instructions:  STOP the Carvedilol  (Coreg )  START Amlodipine 5 mg once daily  *If you need a refill on your cardiac medications before your next appointment, please call your pharmacy*  Lab Work: None ordered If you have labs (blood work) drawn today and your tests are completely normal, you will receive your results only by: MyChart Message (if you have MyChart) OR A paper copy in the mail If you have any lab test that is abnormal or we need to change your treatment, we will call you to review the results.  Testing/Procedures: None ordered  Follow-Up: At [facility], you and your health needs are our priority.  As part of our continuing mission to provide you with exceptional heart care, our providers are all part of one team.  This team includes your primary Cardiologist (physician) and Advanced Practice Providers or APPs (Physician Assistants and Nurse Practitioners) who all work together to provide you with the care you need, when you need it.  Your next appointment:   6 month(s)  Provider:   Dr. Darron  We recommend signing up for the patient portal called MyChart.  Sign up information is provided on this After Visit Summary.  MyChart is used to connect with patients for Virtual Visits (Telemedicine).  Patients are able to view lab/test results, encounter notes, upcoming appointments, etc.  Non-urgent messages can be sent to your provider as well.   To learn more about what you can do with MyChart, go to [email or web address].

## 2024-05-30 ENCOUNTER — Other Ambulatory Visit: Payer: Self-pay | Admitting: Hematology & Oncology

## 2024-05-30 ENCOUNTER — Other Ambulatory Visit: Payer: Self-pay | Admitting: Cardiovascular Disease

## 2024-05-30 NOTE — Telephone Encounter (Signed)
 Pt asked for 90 days if possible

## 2024-06-05 ENCOUNTER — Ambulatory Visit (INDEPENDENT_AMBULATORY_CARE_PROVIDER_SITE_OTHER): Admitting: Neurology

## 2024-06-05 ENCOUNTER — Encounter: Payer: Self-pay | Admitting: Neurology

## 2024-06-05 VITALS — BP 138/68 | HR 74 | Ht 68.0 in | Wt 147.4 lb

## 2024-06-05 DIAGNOSIS — I672 Cerebral atherosclerosis: Secondary | ICD-10-CM

## 2024-06-05 NOTE — Patient Instructions (Signed)
 It's good to see you doing well. Continue all your medications. Continue control of blood pressure, cholesterol, sugar levels to help prevent another stroke. Follow-up as needed, call for any changes.
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
## 2024-06-08 ENCOUNTER — Other Ambulatory Visit: Payer: Self-pay

## 2024-06-08 DIAGNOSIS — E118 Type 2 diabetes mellitus with unspecified complications: Secondary | ICD-10-CM

## 2024-06-08 MED ORDER — BLOOD GLUCOSE MONITOR KIT: PACK | 0 refills | Status: AC

## 2024-06-12 DIAGNOSIS — E118 Type 2 diabetes mellitus with unspecified complications: Secondary | ICD-10-CM

## 2024-06-26 ENCOUNTER — Other Ambulatory Visit: Payer: Self-pay | Admitting: Family Medicine

## 2024-06-26 NOTE — Telephone Encounter (Unsigned)
 Copied from CRM [id number]. Topic: Clinical - Medication Refill >> Jun 26, 2024  1:22 PM Roselie C wrote: Medication: empagliflozin  (JARDIANCE ) 10 MG TABS tablet  Pharmacy told patient he needs 90 day supply   Has the patient contacted their pharmacy? Yes (Agent: If no, request that the patient contact the pharmacy for the refill. If patient does not wish to contact the pharmacy document the reason why and proceed with request.) (Agent: If yes, when and what did the pharmacy advise?)  This is the patient's preferred pharmacy:  [id number] - [place], [place] - [address] [place] [place] [address] Phone: [phone] Fax: [phone]  Is this the correct pharmacy for this prescription? Yes If no, delete pharmacy and type the correct one.   Has the prescription been filled recently? Yes  Is the patient out of the medication? Yes  Has the patient been seen for an appointment in the last year OR does the patient have an upcoming appointment? Yes  Can we respond through MyChart? No  Agent: Please be advised that Rx refills may take up to 3 business days. We ask that you follow-up with your pharmacy.

## 2024-06-27 NOTE — Telephone Encounter (Signed)
 Pt has upcoming appt on jan 9

## 2024-06-29 ENCOUNTER — Ambulatory Visit: Admitting: Family Medicine

## 2024-06-30 ENCOUNTER — Other Ambulatory Visit: Payer: Self-pay | Admitting: Family Medicine

## 2024-07-02 ENCOUNTER — Encounter: Payer: Self-pay | Admitting: Family Medicine

## 2024-07-02 ENCOUNTER — Ambulatory Visit: Admitting: Family Medicine

## 2024-07-02 VITALS — BP 134/82 | HR 77 | Temp 98.1°F | Wt 147.0 lb

## 2024-07-02 DIAGNOSIS — I10 Essential (primary) hypertension: Secondary | ICD-10-CM | POA: Diagnosis not present

## 2024-07-02 DIAGNOSIS — Z8673 Personal history of transient ischemic attack (TIA), and cerebral infarction without residual deficits: Secondary | ICD-10-CM

## 2024-07-02 DIAGNOSIS — I4892 Unspecified atrial flutter: Secondary | ICD-10-CM

## 2024-07-02 DIAGNOSIS — E118 Type 2 diabetes mellitus with unspecified complications: Secondary | ICD-10-CM | POA: Diagnosis not present

## 2024-07-02 DIAGNOSIS — E782 Mixed hyperlipidemia: Secondary | ICD-10-CM

## 2024-07-02 DIAGNOSIS — D500 Iron deficiency anemia secondary to blood loss (chronic): Secondary | ICD-10-CM

## 2024-07-02 DIAGNOSIS — I4729 Other ventricular tachycardia: Secondary | ICD-10-CM | POA: Diagnosis not present

## 2024-07-02 DIAGNOSIS — I739 Peripheral vascular disease, unspecified: Secondary | ICD-10-CM | POA: Diagnosis not present

## 2024-07-02 DIAGNOSIS — E1149 Type 2 diabetes mellitus with other diabetic neurological complication: Secondary | ICD-10-CM

## 2024-07-02 DIAGNOSIS — C9000 Multiple myeloma not having achieved remission: Secondary | ICD-10-CM | POA: Diagnosis not present

## 2024-07-02 DIAGNOSIS — Z951 Presence of aortocoronary bypass graft: Secondary | ICD-10-CM

## 2024-07-02 DIAGNOSIS — N1831 Chronic kidney disease, stage 3a: Secondary | ICD-10-CM | POA: Diagnosis not present

## 2024-07-02 DIAGNOSIS — I779 Disorder of arteries and arterioles, unspecified: Secondary | ICD-10-CM | POA: Diagnosis not present

## 2024-07-02 DIAGNOSIS — I252 Old myocardial infarction: Secondary | ICD-10-CM | POA: Diagnosis not present

## 2024-07-02 LAB — POCT GLYCOSYLATED HEMOGLOBIN (HGB A1C)
HbA1c POC (<> result, manual entry): 6.8 %
HbA1c, POC (controlled diabetic range): 6.8 % (ref 0.0–7.0)
HbA1c, POC (prediabetic range): 6.8 % — AB (ref 5.7–6.4)
Hemoglobin A1C: 6.8 % — AB (ref 4.0–5.6)

## 2024-07-02 LAB — MICROALBUMIN / CREATININE URINE RATIO
Creatinine,U: 62.3 mg/dL
Microalb Creat Ratio: 187.9 mg/g — ABNORMAL HIGH (ref 0.0–30.0)
Microalb, Ur: 11.7 mg/dL — ABNORMAL HIGH (ref 0.7–1.9)

## 2024-07-02 MED ORDER — TRAZODONE HCL 50 MG PO TABS: 50.0000 mg | ORAL_TABLET | Freq: Every evening | ORAL | 1 refills | Status: AC | PRN

## 2024-07-02 MED ORDER — EZETIMIBE 10 MG PO TABS: 10.0000 mg | ORAL_TABLET | Freq: Every day | ORAL | 3 refills | Status: AC

## 2024-07-02 MED ORDER — ROSUVASTATIN CALCIUM 20 MG PO TABS: 20.0000 mg | ORAL_TABLET | Freq: Every day | ORAL | 3 refills | Status: AC

## 2024-07-02 MED ORDER — EMPAGLIFLOZIN 10 MG PO TABS: 10.0000 mg | ORAL_TABLET | Freq: Every day | ORAL | 1 refills | Status: DC

## 2024-07-02 MED ORDER — INSULIN GLARGINE 100 UNIT/ML SOLOSTAR PEN: PEN_INJECTOR | SUBCUTANEOUS | 1 refills | Status: AC

## 2024-07-02 NOTE — Progress Notes (Signed)
 "    Patient ID: Marvin Salinas, male  DOB: 21-Nov-1945, 79 y.o.   MRN: [id number] Patient Care Team    Relationship Specialty Notifications Start End  Catherine Charlies LABOR, DO PCP - General Family Medicine  01/24/19   Darron Deatrice LABOR, MD PCP - Cardiology Cardiology  05/08/24   Shellia Oh, MD Consulting Physician Pulmonary Disease  01/26/19   [name], Aleene PARAS, MD (Inactive) Consulting Physician Cardiology  04/30/20   Teressa Toribio SQUIBB, MD (Inactive) Attending Physician Gastroenterology  10/23/20   Franchot Lauraine HERO, NP Nurse Practitioner Nurse Practitioner  10/23/20    Comment: Hematology  Georjean Darice HERO, MD Consulting Physician Neurology  09/01/21   Rachele Gaynell RAMAN, MD Consulting Physician Nephrology  10/28/21    Comment: [facility] [place] [facility]  [facility]    03/28/23   Darron Deatrice LABOR, MD Consulting Physician Cardiology  03/28/23     Chief Complaint  Patient presents with   Diabetes    Chronic condition management Pt is fasting.     Subjective: Marvin Salinas is a 79 y.o.  male present for Chronic Conditions/illness Management Type 2 diabetes mellitus with stage 3 chronic kidney disease, with long-term current use of insulin  (HCC) Pt reports compliance with Lantus  20/20 twice daily(usually) units  and jardiance   Patient denies dizziness, hyperglycemic or hypoglycemic events. Patient denies numbness, tingling in the extremities or nonhealing wounds of feet.   Atrial fibrillation with RVR (HCC)/Essential hypertension/Ischemic heart disease/ S/P CABG x 3/Paroxysmal atrial flutter (HCC)/PAD (peripheral artery disease) (HCC)/Nonsustained ventricular tachycardia (HCC)/HLD/NSTEMI/h/o stroke/CKD 3/chronic anticoagulation Patient has a history of severe 3-vessel coronary artery disease with chronic total occlusions of the proximal/mid LAD, mid/distal left circumflex/and proximal RCA.  Moderate caliber D1 and distal RPDA demonstrate 70-80% stenosis.  Moderately elevated left ventricular  filling pressure.  Coronary artery stents and CABG have been completed in the past.  He also has a history of cerebral ischemia and TIAs.  Pt reports compliance with  amlodipine , Crestor , Eliquis ,  coreg , Zetia , tikosyn .  Patient denies chest pain, shortness of breath, dizziness or lower extremity edema.   Diet: Heart healthy-low-sodium diet encouraged RF: Hypertension, hyperlipidemia, former smoker, STEMI, stroke, diabetes, family history, CAD, CVD, PAD, bilateral carotid artery stenosis, TIA   Lambda light chain myeloma: Established with oncology, Dr. Timmy.  Prescribed Zofran , gabapentin  and famciclovir      04/03/2024   10:14 AM 01/02/2024    9:55 AM 03/28/2023    8:36 AM 03/21/2023    1:07 PM 10/11/2022    8:20 AM  Depression screen PHQ 2/9  Decreased Interest 0 0 1 0 1  Down, Depressed, Hopeless 0 0 1 0 1  PHQ - 2 Score 0 0 2 0 2  Altered sleeping   1  1  Tired, decreased energy   1  1  Change in appetite   0  0  Feeling bad or failure about yourself    0  0  Trouble concentrating   0  0  Moving slowly or fidgety/restless   0  0  Suicidal thoughts   0  0  PHQ-9 Score   4   4   Difficult doing work/chores   Not difficult at all  Somewhat difficult     Data saved with a previous flowsheet row definition      03/28/2023    8:36 AM 10/28/2021    9:13 AM  GAD 7 : Generalized Anxiety Score  Nervous, Anxious, on Edge 0 0  Control/stop worrying 1 1  Worry too much - different things 1 1  Trouble relaxing 1 1  Restless 1 1  Easily annoyed or irritable 0 0  Afraid - awful might happen 0 0  Total GAD 7 Score 4 4  Anxiety Difficulty Not difficult at all           06/05/2024    8:28 AM 09/08/2023    9:04 AM 03/28/2023    8:35 AM 03/21/2023    9:22 AM 10/11/2022    8:20 AM  Fall Risk   Falls in the past year? 0 0  0 0 1  Number falls in past yr: 0  0 0 1  Injury with Fall? 0  0  0  0   Risk for fall due to : No Fall Risks   No Fall Risks   Follow up Falls evaluation  completed  Falls evaluation completed Falls evaluation completed Falls evaluation completed     Manually entered by patient   Data saved with a previous flowsheet row definition    Immunization History  Administered Date(s) Administered   Fluad Quad(high Dose 65+) 02/12/2019, 04/29/2020, 04/22/2021, 06/09/2022   Fluzone Influenza virus vaccine,trivalent (IIV3), split virus 04/21/2016   INFLUENZA, HIGH DOSE SEASONAL PF 05/08/2013, 03/28/2017, 05/02/2018, 05/12/2023, 04/28/2024   Influenza Split 05/08/2013, 03/28/2017   Influenza,inj,Quad PF,6+ Mos 04/21/2016   MODERNA COVID-19 SARS-COV-2 PEDS BIVALENT BOOSTER 78yr-90yr 06/18/2020, 12/10/2020   Moderna SARS-COV2 Booster Vaccination 12/10/2020   Moderna Sars-Covid-2 Vaccination 08/27/2019, 10/03/2019   Pneumococcal Conjugate-13 07/13/2016   Pneumococcal Polysaccharide-23 02/21/2018   Tdap 02/15/2013   Zoster, Live 06/22/2015    No results found.  Past Medical History:  Diagnosis Date   Acute kidney injury superimposed on chronic kidney disease 10/12/2017   Atrial fibrillation with RVR (HCC) 10/27/2017   Back pain    Basilar artery stenosis    on chronic Plavix    Benign prostatic hypertrophy without urinary obstruction 04/17/2014   Carotid artery disease 10/06/2010   Carotid US  04/2019: Bilat ICA 40-59; L subclavian stenosis  // Carotid US  11/21: Bilat ICA 40-59; bilateral subclavian stenosis // Carotid US  11/22: Bilateral ICA 40-59; left subclavian stenosis // Carotid US  04/28/2022: Bilateral ICA 40-59; left subclavian stenosis   Cerebral vascular disease    with prior TIA's; followed by Dr. Maurice   Depression, neurotic 11/21/2013   Diabetes mellitus    on insulin    Enthesopathy of ankle and tarsus 09/04/2007   Overview:  Metatarsalgia    Enthesopathy of ankle and tarsus 09/04/2007   Overview:  Metatarsalgia  Formatting of this note might be different from the original. Metatarsalgia  10/1 IMO update   History of renal calculi     Hyperlipidemia    Hypertension    Ischemic heart disease    prior PCI to RCA in 1989. S/P PCI to LAD and OM in 1992. S/P PCI to first DX in 2000. S/P CABG x 3 in May 2011   Lambda light chain myeloma (HCC) 10/28/2021   Left hip pain 01/03/2020   OSA (obstructive sleep apnea) 01/11/2018   AHI 18.1 and SaO2 low 73%   PAD (peripheral artery disease) 11/18/2017   ABIs/Arterial US  04/2019: R 1.21; L 0.66 // R SFA 30-49, stable > 50 CIA and EIA stenosis; L > 50 CIA stenosis (likely represents severe stenosis or short segment occlusion)   Peripheral neuropathy    Pneumonia 02/2011   Sacroiliac joint dysfunction of left side 01/03/2020   Stroke (HCC) 04/16/2001  small right cerebellar infarct on 04/16/2001 at that time he was found to have proximal left vertebral artery, proximal left common carotid artery and both external carotid artery stenosis as well as intracranial stenosis involving mid basilar artery- 07/2011 add questionable TIA   Allergies  Allergen Reactions   Cymbalta  [Duloxetine  Hcl] Other (See Comments)    Halucinations   Codeine Nausea And Vomiting   Lisinopril Cough   Nsaids Other (See Comments)    CKD   Dapagliflozin Rash   Latex Hives   Past Surgical History:  Procedure Laterality Date    NASAL ENDOSCOPY  01/11/2020   chronic rhinitis w/o evidence of acute sinusitis, bilateral inferior turbinate hypertrophy   ABDOMINAL AORTOGRAM W/LOWER EXTREMITY Bilateral 05/30/2019   Procedure: ABDOMINAL AORTOGRAM W/LOWER EXTREMITY;  Surgeon: Darron Deatrice LABOR, MD;  Location: [facility];  Service: Cardiovascular;  Laterality: Bilateral;   ANGIOPLASTY  1989   right coronary artery   ANGIOPLASTY  1992   LAD and OM   ANGIOPLASTY  1998   First DX   BIOPSY  05/06/2023   Procedure: BIOPSY;  Surgeon: Stacia Glendia BRAVO, MD;  Location: [facility];  Service: Gastroenterology;;   BRAIN SURGERY     on prior records   CORONARY ARTERY BYPASS GRAFT  11/12/2009   LIMA to LAD, SVG  to OM and SVG to RCA   CORONARY STENT PLACEMENT  2000   Stent to LAD/Circumflex with angioplasty to first diagonal    ENTEROSCOPY N/A 05/06/2023   Procedure: ENTEROSCOPY;  Surgeon: Stacia Glendia BRAVO, MD;  Location: [facility];  Service: Gastroenterology;  Laterality: N/A;   HOT HEMOSTASIS N/A 05/06/2023   Procedure: HOT HEMOSTASIS (ARGON PLASMA COAGULATION/BICAP);  Surgeon: Stacia Glendia BRAVO, MD;  Location: [facility];  Service: Gastroenterology;  Laterality: N/A;   LEFT HEART CATH AND CORS/GRAFTS ANGIOGRAPHY N/A 10/13/2017   Procedure: LEFT HEART CATH AND CORS/GRAFTS ANGIOGRAPHY;  Surgeon: Mady Bruckner, MD;  Location: [facility];  Service: Cardiovascular;  Laterality: N/A;   SUBMUCOSAL TATTOO INJECTION  05/06/2023   Procedure: SUBMUCOSAL TATTOO INJECTION;  Surgeon: Stacia Glendia BRAVO, MD;  Location: [facility];  Service: Gastroenterology;;   Family History  Problem Relation Age of Onset   Depression Mother    Early death Mother    Kidney disease Mother    Ovarian cancer Mother    Hypertension Father    Heart disease Father    Heart attack Father    Diabetes Sister    Asthma Brother    Diabetes Brother    Stroke Other        Uncle   Colon cancer Neg Hx    Rectal cancer Neg Hx    Stomach cancer Neg Hx    Esophageal cancer Neg Hx    Social History   Social History Narrative   Marital status/children/pets: divorced.   Education/employment: retired, Patient has his GED. 9th grade education.    Safety:      -smoke alarm in the home:Yes     - wears seatbelt: Yes   Patient is right handed.   Patient does not drink any caffeine.   Lives in a one story home     Allergies as of 07/02/2024       Reactions   Cymbalta  [duloxetine  Hcl] Other (See Comments)   Halucinations   Codeine Nausea And Vomiting   Lisinopril Cough   Nsaids Other (See Comments)   CKD   Dapagliflozin Rash   Latex Hives  Medication List        Accurate as of July 02, 2024  9:44 AM. If you have any questions, ask your nurse or doctor.          acetaminophen  500 MG tablet Commonly known as: TYLENOL  Take 1,000 mg by mouth every 6 (six) hours as needed for mild pain (pain score 1-3) or moderate pain (pain score 4-6).   amLODipine  5 MG tablet Commonly known as: NORVASC  Take 1 tablet (5 mg total) by mouth daily.   apixaban  5 MG Tabs tablet Commonly known as: Eliquis  Take 1 tablet (5 mg total) by mouth 2 (two) times daily.   blood glucose meter kit and supplies Kit Dispense based on patient and insurance preference. Use up to four times daily as directed.   dofetilide  125 MCG capsule Commonly known as: TIKOSYN  TAKE 1 CAPSULE BY MOUTH TWICE A DAY   Embecta Pen Needle Nano 2 Gen 32G X 4 MM Misc Generic drug: Insulin  Pen Needle PLACE 1 NEEDLE ONTO THE SKIN IN THE MORNING AND AT BEDTIME.   empagliflozin  10 MG Tabs tablet Commonly known as: Jardiance  Take 1 tablet (10 mg total) by mouth daily before breakfast.   ezetimibe  10 MG tablet Commonly known as: ZETIA  Take 1 tablet (10 mg total) by mouth daily.   famciclovir  250 MG tablet Commonly known as: FAMVIR  TAKE 1 TABLET BY MOUTH EVERY DAY   insulin  glargine 100 UNIT/ML Solostar Pen Commonly known as: LANTUS  20 units BID What changed: additional instructions   nitroGLYCERIN  0.4 MG SL tablet Commonly known as: NITROSTAT  Place 0.4 mg under the tongue every 5 (five) minutes as needed for chest pain.   ondansetron  8 MG tablet Commonly known as: ZOFRAN  TAKE 1 TABLET BY MOUTH EVERY 8 HOURS AS NEEDED FOR NAUSEA OR VOMITING   polyethylene glycol 17 g packet Commonly known as: MIRALAX / GLYCOLAX Take 17 g by mouth daily.   rosuvastatin  20 MG tablet Commonly known as: CRESTOR  Take 1 tablet (20 mg total) by mouth at bedtime.   traZODone  50 MG tablet Commonly known as: DESYREL  Take 1 tablet (50 mg total) by mouth at bedtime as needed for sleep.   Vitamin D -3 25 MCG (1000 UT) Caps Take by  mouth.        All past medical history, surgical history, allergies, family history, immunizations andmedications were updated in the EMR today and reviewed under the history and medication portions of their EMR.      ROS: 14 pt review of systems performed and negative (unless mentioned in an HPI)  Objective: BP 134/82   Pulse 77   Temp 98.1 F (36.7 C)   Wt 147 lb (66.7 kg)   SpO2 95%   BMI 22.35 kg/m  Physical Exam Vitals and nursing note reviewed.  Constitutional:      General: He is not in acute distress.    Appearance: Normal appearance. He is not ill-appearing, toxic-appearing or diaphoretic.  HENT:     Head: Normocephalic and atraumatic.  Eyes:     General: No scleral icterus.       Right eye: No discharge.        Left eye: No discharge.     Extraocular Movements: Extraocular movements intact.     Pupils: Pupils are equal, round, and reactive to light.  Cardiovascular:     Rate and Rhythm: Normal rate and regular rhythm.     Heart sounds: No murmur heard. Pulmonary:     Effort: Pulmonary effort  is normal. No respiratory distress.     Breath sounds: Normal breath sounds. No wheezing, rhonchi or rales.  Musculoskeletal:     Cervical back: Neck supple.     Right lower leg: No edema.     Left lower leg: No edema.  Skin:    General: Skin is warm and dry.     Coloration: Skin is not jaundiced or pale.     Findings: No rash.  Neurological:     Mental Status: He is alert and oriented to person, place, and time. Mental status is at baseline.  Psychiatric:        Mood and Affect: Mood normal.        Behavior: Behavior normal.        Thought Content: Thought content normal.        Judgment: Judgment normal.      Results for orders placed or performed in visit on 07/02/24 (from the past 24 hours)  POCT glycosylated hemoglobin (Hb A1C)     Status: Abnormal   Collection Time: 07/02/24  9:27 AM  Result Value Ref Range   Hemoglobin A1C 6.8 (A) 4.0 - 5.6 %   HbA1c  POC (<> result, manual entry) 6.8 4.0 - 5.6 %   HbA1c, POC (prediabetic range) 6.8 (A) 5.7 - 6.4 %   HbA1c, POC (controlled diabetic range) 6.8 0.0 - 7.0 %      Assessment/plan: Marvin Salinas is a 79 y.o. male present for chronic condition management Type 2 diabetes mellitus with stage 3 chronic kidney disease, with long-term current use of insulin  (HCC) -Patient was encouraged to follow a diabetic diet and make an effort to get routine exercise.  -continue Lantus  to 20 units in the a.m and 20 at night -consider wegovy start next visit if needed for cardiac/stroke and DM - Given his cardiac history and his CKD oral options are limited-insurance does not cover Jardiance .  Developed a rash with Farxiga. - Medicines tried: Ozempic (side effects), Farxiga (rash) - He declined nutrition referral - microalb collected 07/02/2024  PNA series: Pneumonia series completed Flu shot: UTD 2025 (recommneded yearly).   Foot exam: Referred to podiatry 12/2018-completed 07/02/2024 Eye exam: [facility] UTD 10/24/2023 A1c: 8.9>>> 13.7 (08/22/2018)>>11 (01/24/2019)>>10.8 08/20/2019> 10.5>10.3> 6.4 > 6.2 > 8.4 >7.6 today> 8.1(requiring chronic steroids for myeloma) > 8.8>7.0: (03/17/2023)>  > 8.0> 6.8> 6.8 today!  Atrial fibrillation with RVR (HCC)/Essential hypertension/Ischemic heart disease/ S/P CABG x 3/Paroxysmal atrial flutter (HCC)/PAD (peripheral artery disease) (HCC)/Nonsustained ventricular tachycardia (HCC)/HLD/NSTEMI/h/o stroke/PAD Stable - Patient aware goal is less than 130/80.   - Prescribed by cardio:amlodipine  5 mg, coreg , tikosyn , Eliquis  (recently changed from xarelto ) - Continue normal Crestor  20 mg daily  - Continue zeita.    Chronic kidney disease (CKD), stage III (moderate) (HCC)/lambda light chain myeloma Renally dose meds. Avoid NSAIDs. PTH/calcium /vitamin D  > by nephro  Established with heme-onc for myeloma  Sleep disturbance: Uncomfortable from chemo SE and can not shut his  mind down to relax.  Continue trazodone  50 mg at bedtime to help sleep PRN.    Return in about 15 weeks (around 10/15/2024) for Routine chronic condition follow-up.  Orders Placed This Encounter  Procedures   Urine Microalbumin w/creat. ratio   POCT glycosylated hemoglobin (Hb A1C)    Meds ordered this encounter  Medications   ezetimibe  (ZETIA ) 10 MG tablet    Sig: Take 1 tablet (10 mg total) by mouth daily.    Dispense:  90 tablet  Refill:  3   rosuvastatin  (CRESTOR ) 20 MG tablet    Sig: Take 1 tablet (20 mg total) by mouth at bedtime.    Dispense:  90 tablet    Refill:  3   empagliflozin  (JARDIANCE ) 10 MG TABS tablet    Sig: Take 1 tablet (10 mg total) by mouth daily before breakfast.    Dispense:  90 tablet    Refill:  1   insulin  glargine (LANTUS ) 100 UNIT/ML Solostar Pen    Sig: 20 units BID    Dispense:  90 mL    Refill:  1   traZODone  (DESYREL ) 50 MG tablet    Sig: Take 1 tablet (50 mg total) by mouth at bedtime as needed for sleep.    Dispense:  90 tablet    Refill:  1    Referral Orders  No referral(s) requested today     Note is dictated utilizing voice recognition software. Although note has been proof read prior to signing, occasional typographical errors still can be missed. If any questions arise, please do not hesitate to call for verification.  Electronically signed by: Charlies Bellini, DO [facility]  "

## 2024-07-02 NOTE — Patient Instructions (Signed)
 Return in about 15 weeks (around 10/15/2024) for Routine chronic condition follow-up.        Great to see you today.  I have refilled the medication(s) we provide.   If labs were collected or images ordered, we will inform you of  results once we have received them and reviewed. We will contact you either by echart message, or telephone call.  Please give ample time to the testing facility, and our office to run,  receive and review results. Please do not call inquiring of results, even if you can see them in your chart. We will contact you as soon as we are able. If it has been over 1 week since the test was completed, and you have not yet heard from us , then please call us .    - echart message- for normal results that have been seen by the patient already.   - telephone call: abnormal results or if patient has not viewed results in their echart.  If a referral to a specialist was entered for you, please call us  in 2 weeks if you have not heard from the specialist office to schedule.

## 2024-07-03 ENCOUNTER — Telehealth: Payer: Self-pay | Admitting: Cardiovascular Disease

## 2024-07-03 ENCOUNTER — Ambulatory Visit: Payer: Self-pay | Admitting: Family Medicine

## 2024-07-03 MED ORDER — EMPAGLIFLOZIN 25 MG PO TABS: 25.0000 mg | ORAL_TABLET | Freq: Every day | ORAL | 1 refills | Status: AC

## 2024-07-03 MED ORDER — APIXABAN 5 MG PO TABS: 5.0000 mg | ORAL_TABLET | Freq: Two times a day (BID) | ORAL | 1 refills | Status: AC

## 2024-07-03 NOTE — Telephone Encounter (Signed)
" °*  STAT* If patient is at the pharmacy, call can be transferred to refill team.   1. Which medications need to be refilled? (please list name of each medication and dose if known)   apixaban  (ELIQUIS ) 5 MG TABS tablet   Take 1 tablet (5 mg total) by mouth 2 (two) times daily.   2. Would you like to learn more about the convenience, safety, & potential cost savings by using the [facility]? No   3. Are you open to using the [facility]? No   4. Which pharmacy/location (including street and city if local pharmacy) is medication to be sent to?[id number] - [place], [place] - [address]    5. Do they need a 30 day or 90 day supply? 90 Day Supply  Pt is currently out of medication   "

## 2024-07-03 NOTE — Telephone Encounter (Signed)
 Refills has been sent to the pharmacy.

## 2024-07-24 ENCOUNTER — Inpatient Hospital Stay

## 2024-07-24 ENCOUNTER — Inpatient Hospital Stay: Admitting: Hematology & Oncology

## 2024-07-30 ENCOUNTER — Inpatient Hospital Stay

## 2024-07-30 ENCOUNTER — Inpatient Hospital Stay: Admitting: Hematology & Oncology
